# Patient Record
Sex: Male | Born: 1945 | Race: Black or African American | Hispanic: No | Marital: Married | Smoking: Former smoker
Health system: Southern US, Community
[De-identification: ages and names within clinical notes are randomized; demographics above are authoritative.]

## PROBLEM LIST (undated history)

## (undated) DIAGNOSIS — K5790 Diverticulosis of intestine, part unspecified, without perforation or abscess without bleeding: Secondary | ICD-10-CM

## (undated) DIAGNOSIS — E785 Hyperlipidemia, unspecified: Secondary | ICD-10-CM

## (undated) DIAGNOSIS — K5792 Diverticulitis of intestine, part unspecified, without perforation or abscess without bleeding: Secondary | ICD-10-CM

## (undated) DIAGNOSIS — K5900 Constipation, unspecified: Secondary | ICD-10-CM

## (undated) DIAGNOSIS — I5032 Chronic diastolic (congestive) heart failure: Secondary | ICD-10-CM

## (undated) DIAGNOSIS — N200 Calculus of kidney: Secondary | ICD-10-CM

## (undated) DIAGNOSIS — I10 Essential (primary) hypertension: Secondary | ICD-10-CM

## (undated) DIAGNOSIS — N529 Male erectile dysfunction, unspecified: Secondary | ICD-10-CM

## (undated) DIAGNOSIS — K589 Irritable bowel syndrome without diarrhea: Secondary | ICD-10-CM

## (undated) DIAGNOSIS — I509 Heart failure, unspecified: Secondary | ICD-10-CM

## (undated) DIAGNOSIS — D126 Benign neoplasm of colon, unspecified: Secondary | ICD-10-CM

## (undated) DIAGNOSIS — R011 Cardiac murmur, unspecified: Secondary | ICD-10-CM

## (undated) DIAGNOSIS — N289 Disorder of kidney and ureter, unspecified: Secondary | ICD-10-CM

## (undated) HISTORY — DX: Disorder of kidney and ureter, unspecified: N28.9

## (undated) HISTORY — DX: Constipation, unspecified: K59.00

## (undated) HISTORY — DX: Cardiac murmur, unspecified: R01.1

## (undated) HISTORY — DX: Irritable bowel syndrome, unspecified: K58.9

## (undated) HISTORY — DX: Diverticulosis of intestine, part unspecified, without perforation or abscess without bleeding: K57.90

## (undated) HISTORY — DX: Calculus of kidney: N20.0

## (undated) HISTORY — DX: Chronic diastolic (congestive) heart failure: I50.32

## (undated) HISTORY — DX: Benign neoplasm of colon, unspecified: D12.6

## (undated) HISTORY — DX: Essential (primary) hypertension: I10

## (undated) HISTORY — DX: Male erectile dysfunction, unspecified: N52.9

## (undated) HISTORY — PX: OTHER SURGICAL HISTORY: SHX169

## (undated) HISTORY — DX: Diverticulitis of intestine, part unspecified, without perforation or abscess without bleeding: K57.92

## (undated) HISTORY — DX: Hyperlipidemia, unspecified: E78.5

## (undated) HISTORY — DX: Heart failure, unspecified: I50.9

---

## 1996-07-03 HISTORY — PX: COLONOSCOPY: SHX174

## 1999-07-12 ENCOUNTER — Encounter: Payer: Self-pay | Admitting: Cardiovascular Disease

## 2004-07-25 ENCOUNTER — Ambulatory Visit: Payer: Self-pay | Admitting: Internal Medicine

## 2004-07-25 ENCOUNTER — Encounter: Admission: RE | Admit: 2004-07-25 | Discharge: 2004-07-25 | Payer: Self-pay | Admitting: Internal Medicine

## 2004-08-19 ENCOUNTER — Ambulatory Visit: Payer: Self-pay | Admitting: Internal Medicine

## 2005-01-02 ENCOUNTER — Ambulatory Visit (HOSPITAL_COMMUNITY): Admission: RE | Admit: 2005-01-02 | Discharge: 2005-01-02 | Payer: Self-pay | Admitting: Internal Medicine

## 2005-01-02 ENCOUNTER — Ambulatory Visit: Payer: Self-pay | Admitting: Internal Medicine

## 2005-01-06 ENCOUNTER — Ambulatory Visit: Payer: Self-pay | Admitting: Internal Medicine

## 2005-03-27 ENCOUNTER — Ambulatory Visit: Payer: Self-pay | Admitting: Internal Medicine

## 2005-04-04 ENCOUNTER — Ambulatory Visit: Payer: Self-pay | Admitting: Internal Medicine

## 2005-11-03 ENCOUNTER — Ambulatory Visit: Payer: Self-pay | Admitting: Internal Medicine

## 2005-11-16 ENCOUNTER — Ambulatory Visit: Payer: Self-pay | Admitting: Internal Medicine

## 2006-02-21 ENCOUNTER — Ambulatory Visit: Payer: Self-pay | Admitting: Internal Medicine

## 2006-09-07 ENCOUNTER — Ambulatory Visit: Payer: Self-pay | Admitting: Internal Medicine

## 2006-09-07 LAB — CONVERTED CEMR LAB
BUN: 20 mg/dL (ref 6–23)
Creatinine, Ser: 1 mg/dL (ref 0.4–1.5)
Hgb A1c MFr Bld: 6.9 % — ABNORMAL HIGH (ref 4.6–6.0)
Microalb Creat Ratio: 15.5 mg/g (ref 0.0–30.0)
Microalb, Ur: 2 mg/dL — ABNORMAL HIGH (ref 0.0–1.9)
Potassium: 4.4 meq/L (ref 3.5–5.1)
Uric Acid, Serum: 8.7 mg/dL — ABNORMAL HIGH (ref 2.4–7.0)

## 2006-11-28 DIAGNOSIS — I10 Essential (primary) hypertension: Secondary | ICD-10-CM | POA: Insufficient documentation

## 2007-05-03 ENCOUNTER — Telehealth (INDEPENDENT_AMBULATORY_CARE_PROVIDER_SITE_OTHER): Payer: Self-pay | Admitting: *Deleted

## 2007-05-09 ENCOUNTER — Ambulatory Visit: Payer: Self-pay | Admitting: Internal Medicine

## 2007-05-09 DIAGNOSIS — F528 Other sexual dysfunction not due to a substance or known physiological condition: Secondary | ICD-10-CM | POA: Insufficient documentation

## 2007-05-09 DIAGNOSIS — R9431 Abnormal electrocardiogram [ECG] [EKG]: Secondary | ICD-10-CM | POA: Insufficient documentation

## 2007-05-09 DIAGNOSIS — N529 Male erectile dysfunction, unspecified: Secondary | ICD-10-CM | POA: Insufficient documentation

## 2007-05-16 ENCOUNTER — Ambulatory Visit: Payer: Self-pay | Admitting: Cardiovascular Disease

## 2007-05-20 ENCOUNTER — Encounter (INDEPENDENT_AMBULATORY_CARE_PROVIDER_SITE_OTHER): Payer: Self-pay | Admitting: *Deleted

## 2007-05-28 ENCOUNTER — Ambulatory Visit: Payer: Self-pay

## 2007-05-28 ENCOUNTER — Encounter: Payer: Self-pay | Admitting: Cardiovascular Disease

## 2007-05-28 ENCOUNTER — Encounter: Payer: Self-pay | Admitting: Internal Medicine

## 2007-06-06 ENCOUNTER — Telehealth (INDEPENDENT_AMBULATORY_CARE_PROVIDER_SITE_OTHER): Payer: Self-pay | Admitting: *Deleted

## 2007-06-25 ENCOUNTER — Telehealth (INDEPENDENT_AMBULATORY_CARE_PROVIDER_SITE_OTHER): Payer: Self-pay | Admitting: *Deleted

## 2007-07-08 ENCOUNTER — Telehealth (INDEPENDENT_AMBULATORY_CARE_PROVIDER_SITE_OTHER): Payer: Self-pay | Admitting: *Deleted

## 2007-08-06 ENCOUNTER — Ambulatory Visit: Payer: Self-pay | Admitting: Internal Medicine

## 2007-08-06 LAB — CONVERTED CEMR LAB
ALT: 33 units/L (ref 0–53)
AST: 17 units/L (ref 0–37)
Cholesterol: 180 mg/dL (ref 0–200)
HDL: 59 mg/dL (ref >39.0)
Hgb A1c MFr Bld: 6.5 % — ABNORMAL HIGH (ref 4.6–6.0)
LDL Cholesterol: 106 mg/dL — ABNORMAL HIGH (ref 0–99)
Total CHOL/HDL Ratio: 3.1
Triglycerides: 77 mg/dL (ref 0–149)
VLDL: 15 mg/dL (ref 0–40)

## 2007-08-14 ENCOUNTER — Ambulatory Visit: Payer: Self-pay | Admitting: Internal Medicine

## 2007-08-14 DIAGNOSIS — E782 Mixed hyperlipidemia: Secondary | ICD-10-CM | POA: Insufficient documentation

## 2007-12-31 ENCOUNTER — Ambulatory Visit: Payer: Self-pay | Admitting: Internal Medicine

## 2007-12-31 DIAGNOSIS — M109 Gout, unspecified: Secondary | ICD-10-CM | POA: Insufficient documentation

## 2008-01-07 ENCOUNTER — Telehealth (INDEPENDENT_AMBULATORY_CARE_PROVIDER_SITE_OTHER): Payer: Self-pay | Admitting: *Deleted

## 2008-01-08 ENCOUNTER — Ambulatory Visit: Payer: Self-pay | Admitting: Internal Medicine

## 2008-01-10 ENCOUNTER — Telehealth (INDEPENDENT_AMBULATORY_CARE_PROVIDER_SITE_OTHER): Payer: Self-pay | Admitting: *Deleted

## 2008-01-13 ENCOUNTER — Encounter (INDEPENDENT_AMBULATORY_CARE_PROVIDER_SITE_OTHER): Payer: Self-pay | Admitting: *Deleted

## 2008-02-04 ENCOUNTER — Ambulatory Visit: Payer: Self-pay | Admitting: Internal Medicine

## 2008-02-10 LAB — CONVERTED CEMR LAB
ALT: 23 units/L (ref 0–53)
Albumin: 3.7 g/dL (ref 3.5–5.2)
BUN: 22 mg/dL (ref 6–23)
Bilirubin, Direct: 0.1 mg/dL (ref 0.0–0.3)
Cholesterol: 163 mg/dL (ref 0–200)
Creatinine, Ser: 1.3 mg/dL (ref 0.4–1.5)
HDL: 50.5 mg/dL (ref >39.0)
Hgb A1c MFr Bld: 6.7 % — ABNORMAL HIGH (ref 4.6–6.0)
LDL Cholesterol: 97 mg/dL (ref 0–99)
Potassium: 4.3 meq/L (ref 3.5–5.1)
Total Bilirubin: 0.9 mg/dL (ref 0.3–1.2)
Total CHOL/HDL Ratio: 3.2
Total Protein: 6.9 g/dL (ref 6.0–8.3)
Triglycerides: 77 mg/dL (ref 0–149)
VLDL: 15 mg/dL (ref 0–40)

## 2008-02-11 ENCOUNTER — Ambulatory Visit: Payer: Self-pay | Admitting: Internal Medicine

## 2008-08-31 ENCOUNTER — Telehealth (INDEPENDENT_AMBULATORY_CARE_PROVIDER_SITE_OTHER): Payer: Self-pay | Admitting: *Deleted

## 2008-09-04 ENCOUNTER — Ambulatory Visit: Payer: Self-pay | Admitting: Internal Medicine

## 2008-09-04 DIAGNOSIS — R609 Edema, unspecified: Secondary | ICD-10-CM | POA: Insufficient documentation

## 2008-09-04 DIAGNOSIS — M2550 Pain in unspecified joint: Secondary | ICD-10-CM | POA: Insufficient documentation

## 2008-09-05 ENCOUNTER — Encounter: Payer: Self-pay | Admitting: Cardiovascular Disease

## 2008-09-07 ENCOUNTER — Encounter: Payer: Self-pay | Admitting: Internal Medicine

## 2008-09-08 ENCOUNTER — Encounter (INDEPENDENT_AMBULATORY_CARE_PROVIDER_SITE_OTHER): Payer: Self-pay | Admitting: *Deleted

## 2008-09-23 ENCOUNTER — Ambulatory Visit: Payer: Self-pay | Admitting: Internal Medicine

## 2008-12-11 ENCOUNTER — Ambulatory Visit: Payer: Self-pay | Admitting: Internal Medicine

## 2008-12-15 ENCOUNTER — Encounter (INDEPENDENT_AMBULATORY_CARE_PROVIDER_SITE_OTHER): Payer: Self-pay | Admitting: *Deleted

## 2008-12-15 LAB — CONVERTED CEMR LAB
AST: 20 units/L (ref 0–37)
Albumin: 3.8 g/dL (ref 3.5–5.2)
Alkaline Phosphatase: 47 units/L (ref 39–117)
BUN: 19 mg/dL (ref 6–23)
Bilirubin, Direct: 0 mg/dL (ref 0.0–0.3)
Cholesterol: 166 mg/dL (ref 0–200)
Creatinine, Ser: 1.1 mg/dL (ref 0.4–1.5)
HDL: 44.7 mg/dL (ref >39.00)
LDL Cholesterol: 102 mg/dL — ABNORMAL HIGH (ref 0–99)
Potassium: 4 meq/L (ref 3.5–5.1)
Total Bilirubin: 1 mg/dL (ref 0.3–1.2)
Total CHOL/HDL Ratio: 4
Total Protein: 6.7 g/dL (ref 6.0–8.3)
Triglycerides: 98 mg/dL (ref 0.0–149.0)
VLDL: 19.6 mg/dL (ref 0.0–40.0)

## 2008-12-18 ENCOUNTER — Ambulatory Visit: Payer: Self-pay | Admitting: Internal Medicine

## 2008-12-18 DIAGNOSIS — E1129 Type 2 diabetes mellitus with other diabetic kidney complication: Secondary | ICD-10-CM | POA: Insufficient documentation

## 2008-12-19 LAB — CONVERTED CEMR LAB: Hgb A1c MFr Bld: 6.6 % — ABNORMAL HIGH (ref 4.6–6.5)

## 2008-12-22 ENCOUNTER — Encounter (INDEPENDENT_AMBULATORY_CARE_PROVIDER_SITE_OTHER): Payer: Self-pay | Admitting: *Deleted

## 2009-03-05 ENCOUNTER — Telehealth (INDEPENDENT_AMBULATORY_CARE_PROVIDER_SITE_OTHER): Payer: Self-pay | Admitting: *Deleted

## 2009-04-06 ENCOUNTER — Ambulatory Visit: Payer: Self-pay | Admitting: Internal Medicine

## 2009-04-07 ENCOUNTER — Encounter (INDEPENDENT_AMBULATORY_CARE_PROVIDER_SITE_OTHER): Payer: Self-pay | Admitting: *Deleted

## 2009-04-07 LAB — CONVERTED CEMR LAB
Creatinine,U: 158.1 mg/dL
Microalb Creat Ratio: 3.2 mg/g (ref 0.0–30.0)
Microalb, Ur: 0.5 mg/dL (ref 0.0–1.9)
Uric Acid, Serum: 5 mg/dL (ref 4.0–7.8)

## 2009-09-20 ENCOUNTER — Encounter: Payer: Self-pay | Admitting: Internal Medicine

## 2009-11-04 ENCOUNTER — Ambulatory Visit: Payer: Self-pay | Admitting: Internal Medicine

## 2009-11-15 ENCOUNTER — Ambulatory Visit: Payer: Self-pay | Admitting: Internal Medicine

## 2009-12-20 ENCOUNTER — Ambulatory Visit: Payer: Self-pay | Admitting: Internal Medicine

## 2009-12-21 ENCOUNTER — Ambulatory Visit: Payer: Self-pay | Admitting: Internal Medicine

## 2010-01-19 ENCOUNTER — Encounter: Payer: Self-pay | Admitting: Internal Medicine

## 2010-02-16 ENCOUNTER — Encounter: Payer: Self-pay | Admitting: Internal Medicine

## 2010-02-23 ENCOUNTER — Telehealth (INDEPENDENT_AMBULATORY_CARE_PROVIDER_SITE_OTHER): Payer: Self-pay | Admitting: *Deleted

## 2010-02-25 ENCOUNTER — Ambulatory Visit: Payer: Self-pay | Admitting: Internal Medicine

## 2010-02-28 LAB — CONVERTED CEMR LAB
ALT: 24 units/L (ref 0–53)
AST: 14 units/L (ref 0–37)
Albumin: 4 g/dL (ref 3.5–5.2)
Alkaline Phosphatase: 53 units/L (ref 39–117)
BUN: 20 mg/dL (ref 6–23)
Cholesterol: 184 mg/dL (ref 0–200)
Creatinine, Ser: 1.3 mg/dL (ref 0.4–1.5)
Creatinine,U: 164.9 mg/dL
HDL: 54.6 mg/dL (ref >39.00)
Hgb A1c MFr Bld: 6.9 % — ABNORMAL HIGH (ref 4.6–6.5)
Microalb Creat Ratio: 1.8 mg/g (ref 0.0–30.0)
Microalb, Ur: 3 mg/dL — ABNORMAL HIGH (ref 0.0–1.9)
TSH: 1.53 microintl units/mL (ref 0.35–5.50)
Total Bilirubin: 1.1 mg/dL (ref 0.3–1.2)
Total CHOL/HDL Ratio: 3
Total Protein: 6.9 g/dL (ref 6.0–8.3)
Triglycerides: 78 mg/dL (ref 0.0–149.0)
VLDL: 15.6 mg/dL (ref 0.0–40.0)

## 2010-03-02 ENCOUNTER — Ambulatory Visit: Payer: Self-pay | Admitting: Internal Medicine

## 2010-04-01 ENCOUNTER — Encounter: Payer: Self-pay | Admitting: Internal Medicine

## 2010-06-02 ENCOUNTER — Ambulatory Visit: Payer: Self-pay | Admitting: Internal Medicine

## 2010-06-06 LAB — CONVERTED CEMR LAB
ALT: 23 U/L
AST: 16 U/L
Albumin: 4 g/dL
Alkaline Phosphatase: 45 U/L
Bilirubin, Direct: 0.1 mg/dL
Cholesterol: 157 mg/dL
HDL: 50.5 mg/dL
Hgb A1c MFr Bld: 6.3 %
LDL Cholesterol: 94 mg/dL
Total Bilirubin: 0.7 mg/dL
Total CHOL/HDL Ratio: 3
Total Protein: 6.7 g/dL
Triglycerides: 64 mg/dL
VLDL: 12.8 mg/dL

## 2010-06-08 ENCOUNTER — Ambulatory Visit: Payer: Self-pay | Admitting: Internal Medicine

## 2010-07-31 LAB — CONVERTED CEMR LAB
ALT: 26 units/L (ref 0–53)
AST: 18 units/L (ref 0–37)
Albumin: 3.9 g/dL (ref 3.5–5.2)
Alkaline Phosphatase: 50 units/L (ref 39–117)
Basophils Absolute: 0 10*3/uL (ref 0.0–0.1)
Basophils Relative: 0.5 % (ref 0.0–1.0)
Bilirubin, Direct: 0.1 mg/dL (ref 0.0–0.3)
CO2: 27 meq/L (ref 19–32)
Calcium: 9.5 mg/dL (ref 8.4–10.5)
Chloride: 105 meq/L (ref 96–112)
Cholesterol, target level: 200 mg/dL
Cholesterol: 217 mg/dL (ref 0–200)
Creatinine, Ser: 1.2 mg/dL (ref 0.4–1.5)
Direct LDL: 161.8 mg/dL
Eosinophils Relative: 1.7 % (ref 0.0–5.0)
GFR calc Af Amer: 79 mL/min
Glucose, Bld: 133 mg/dL — ABNORMAL HIGH (ref 70–99)
HCT: 41.8 % (ref 39.0–52.0)
HDL goal, serum: 40 mg/dL
HDL: 52 mg/dL (ref >39.0)
Hemoglobin: 13.9 g/dL (ref 13.0–17.0)
Hgb A1c MFr Bld: 6.2 % — ABNORMAL HIGH (ref 4.6–6.0)
Hgb A1c MFr Bld: 6.7 % — ABNORMAL HIGH (ref 4.6–6.0)
Lymphocytes Relative: 35.7 % (ref 12.0–46.0)
Monocytes Absolute: 0.4 10*3/uL (ref 0.2–0.7)
Monocytes Relative: 9.7 % (ref 3.0–11.0)
Neutro Abs: 2.3 10*3/uL (ref 1.4–7.7)
Neutrophils Relative %: 52.4 % (ref 43.0–77.0)
PSA: 0.7 ng/mL (ref 0.10–4.00)
Platelets: 226 10*3/uL (ref 150–400)
Potassium: 4.1 meq/L (ref 3.5–5.1)
Potassium: 4.7 meq/L (ref 3.5–5.1)
RBC: 5.11 M/uL (ref 4.22–5.81)
RDW: 13.1 % (ref 11.5–14.6)
Sodium: 139 meq/L (ref 135–145)
TSH: 1.54 microintl units/mL (ref 0.35–5.50)
Total Bilirubin: 1 mg/dL (ref 0.3–1.2)
Total CHOL/HDL Ratio: 4.2
Total Protein: 6.9 g/dL (ref 6.0–8.3)
Triglycerides: 99 mg/dL (ref 0–149)
VLDL: 20 mg/dL (ref 0–40)
WBC: 4.3 10*3/uL — ABNORMAL LOW (ref 4.5–10.5)

## 2010-08-03 NOTE — Assessment & Plan Note (Signed)
Summary: cpx//tl   Vital Signs:  Patient Profile:   65 Years Old Male Weight:      229.38 pounds Pulse rate:   64 / minute Pulse rhythm:   regular Resp:     16 per minute BP sitting:   174 / 100  (left arm) Cuff size:   large  Pt. in pain?   no  Vitals Entered By: Wendall Stade (May 09, 2007 8:25 AM)                  Chief Complaint:  cpx and Type 2 diabetes mellitus follow-up.  History of Present Illness: He would like another script for Viagra; fasting blood sugar this am was 147 and last night his sugar was 113. He is on 4 BP meds (CCB,B blocker, ACE-I & ARB ) w/o control.See NMR 8/07; LDL 164 with 1803 total & 1610 small dense particles for a risk of 15 %.LDL goal = < 110.  Type 2 Diabetes Mellitus Follow-Up      This is a 65 year old man who presents for Type 2 diabetes mellitus follow-up.  FBS ? 140 on average;pc glucoses  not checked.  The patient reports weight gain, but denies polyuria, polydipsia, blurred vision, self managed hypoglycemia, hypoglycemia requiring help, weight loss, and numbness of extremities.  The patient denies the following symptoms: neuropathic pain, chest pain, vomiting, orthostatic symptoms, poor wound healing, intermittent claudication, vision loss, and foot ulcer.  Since the last visit the patient reports poor dietary compliance, compliance with medications, not exercising regularly, and monitoring blood glucose.  The patient has been measuring capillary blood glucose before breakfast.  Since the last visit, the patient reports having had no eye care and no foot care.    Hypertension History:      He denies headache, chest pain, palpitations, dyspnea with exertion, orthopnea, PND, peripheral edema, visual symptoms, neurologic problems, syncope, and side effects from treatment.  He notes no problems with any antihypertensive medication side effects.  Further comments include: BP @ home 150-/80-85 - 165/90-95.        Positive major cardiovascular  risk factors include male age 12 years old or older, diabetes, hyperlipidemia, and hypertension.  Negative major cardiovascular risk factors include negative family history for ischemic heart disease and non-tobacco-user status.        Further assessment for target organ damage reveals no history of ASHD, stroke/TIA, or peripheral vascular disease.    Lipid Management History:      Positive NCEP/ATP III risk factors include male age 40 years old or older, diabetes, and hypertension.  Negative NCEP/ATP III risk factors include no family history for ischemic heart disease, non-tobacco-user status, no ASHD (atherosclerotic heart disease), no prior stroke/TIA, no peripheral vascular disease, and no history of aortic aneurysm.      Current Allergies (reviewed today): No known allergies   Past Medical History:    Hypertension    Diabetes mellitus, type II    Hyperlipidemia  Past Surgical History:    Colonoscopy X 2   Family History:    Family History Diabetes 1st degree relative (F)    Family History MI (M 81); F prostate CA  Social History:    NO DIET   Risk Factors:  Tobacco use:  quit    Year quit:  1985 Alcohol use:  yes    Type:  occa Exercise:  no  Family History Risk Factors:    Family History of MI in females < 65 years  old:  no    Family History of MI in males < 71 years old:  no   Review of Systems  General      Denies chills, fatigue, fever, loss of appetite, malaise, sleep disorder, sweats, weakness, and weight loss.  Eyes      Denies blurring, discharge, double vision, eye irritation, eye pain, halos, itching, light sensitivity, red eye, vision loss-1 eye, and vision loss-both eyes.  ENT      Denies decreased hearing, difficulty swallowing, ear discharge, earache, hoarseness, nasal congestion, nosebleeds, postnasal drainage, ringing in ears, sinus pressure, and sore throat.  CV      Denies bluish discoloration of lips or nails, chest pain or discomfort,  difficulty breathing at night, difficulty breathing while lying down, leg cramps with exertion, palpitations, shortness of breath with exertion, and swelling of feet.  Resp      Denies cough, excessive snoring, hypersomnolence, morning headaches, shortness of breath, and sputum productive.  GI      Denies abdominal pain, bloody stools, change in bowel habits, constipation, dark tarry stools, diarrhea, indigestion, nausea, and vomiting.  GU      Complains of erectile dysfunction.      Denies decreased libido, hematuria, nocturia, and urinary frequency.  MS      Denies joint pain, joint redness, joint swelling, loss of strength, low back pain, mid back pain, muscle aches, muscle , cramps, muscle weakness, stiffness, and thoracic pain.  Derm      Denies changes in color of skin, changes in nail beds, dryness, excessive perspiration, flushing, hair loss, itching, lesion(s), poor wound healing, and rash.  Neuro      Denies difficulty with concentration, disturbances in coordination, headaches, memory loss, numbness, poor balance, and tingling.  Psych      Denies anxiety, depression, easily angered, easily tearful, and irritability.  Endo      Denies cold intolerance, excessive hunger, excessive thirst, excessive urination, heat intolerance, polyuria, and weight change.  Heme      Denies abnormal bruising and bleeding.  Allergy      Denies hives or rash, itching eyes, persistent infections, seasonal allergies, and sneezing.   Physical Exam  General:     Well-developed,well-nourished,in no acute distress; alert,appropriate and cooperative throughout examination;overweight-appearing.   Head:     Normocephalic and atraumatic without obvious abnormalities. No apparent alopecia or balding. Eyes:     Art narrowing Ears:     Slight scarring TMs Nose:     Septum to R Mouth:     Significance caries ("Dentist wanted to pull them") Neck:     No deformities, masses, or tenderness  noted. Lungs:     Normal respiratory effort, chest expands symmetrically. Lungs are clear to auscultation, no crackles or wheezes. Heart:     Normal rate and regular rhythm. S1 and S2 normal without gallop, murmur, click, rub. S4 with slurring Abdomen:     Bowel sounds positive,abdomen soft and non-tender without masses, organomegaly or hernias noted. Protuberant Rectal:     No external abnormalities noted. Normal sphincter tone. No rectal masses or tenderness.FOB neg Genitalia:     Testes bilaterally descended without nodularity, tenderness or masses. No scrotal masses or lesions. No penis lesions or urethral discharge. Prostate:     Prostate gland firm and smooth, no enlargement, nodularity, tenderness, mass, asymmetry or induration. Pulses:     R and L carotid,radial,dorsalis pedis and posterior tibial pulses are full and equal bilaterally Extremities:     trace left pedal  edema and trace right pedal edema.Lipedema  Neurologic:     strength normal in all extremities, gait normal, and DTRs symmetrical and normal.   Skin:     Intact without suspicious lesions or rashes Cervical Nodes:     No lymphadenopathy noted Axillary Nodes:     No palpable lymphadenopathy Psych:     Oriented X3, memory intact for recent and remote, normally interactive, good eye contact, not anxious appearing, and not depressed appearing.  Non adherent    Impression & Recommendations:  Problem # 1:  ROUTINE GENERAL MEDICAL EXAM@[email or web address]  CARE FACL (ICD-V70.0)  Orders: EKG w/ Interpretation (93000) TLB-Lipid Panel (80061-LIPID) TLB-BMP (Basic Metabolic Panel-BMET) (80048-METABOL) TLB-CBC Platelet - w/Differential (85025-CBCD) TLB-Hepatic/Liver Function Pnl (80076-HEPATIC) TLB-TSH (Thyroid Stimulating Hormone) (84443-TSH) TLB-A1C / Hgb A1C (Glycohemoglobin) (83036-A1C) TLB-Microalbumin/Creat Ratio, Urine (82043-MALB) TLB-PSA (Prostate Specific Antigen) (84153-PSA)   Problem # 2:  DIABETES MELLITUS, TYPE  II (ICD-250.00)  His updated medication list for this problem includes:    Actoplus Met 15-500 Mg Tabs (Pioglitazone hcl-metformin hcl) .[name]... Take 1 tablet daily with largest meals    Micardis 80 Mg Tabs (Telmisartan) .[name]... 1 by mouth once daily -    Benazepril Hcl 40 Mg Tabs (Benazepril hcl) .[name]... 1 by mouth once daily - due   Problem # 3:  HYPERTENSION, ESSENTIAL NOS (ICD-401.9)  His updated medication list for this problem includes:    Verapamil Hcl Cr 240 Mg Tbcr (Verapamil hcl) .[name]... 1/2 by mouth bid    Metoprolol Tartrate 100 Mg Tabs (Metoprolol tartrate) .[name]... Take 1 tablet 2 times a day    Micardis 80 Mg Tabs (Telmisartan) .[name]... 1 by mouth once daily -    Benazepril Hcl 40 Mg Tabs (Benazepril hcl) .[name]... 1 by mouth once daily - due  Orders: Cardiology Referral (Cardiology)   Problem # 4:  ERECTILE DYSFUNCTION (ICD-302.72)  His updated medication list for this problem includes:    Viagra 100 Mg Tabs (Sildenafil citrate) ..... Use as directed   Problem # 5:  NONSPECIFIC ABNORMAL ELECTROCARDIOGRAM (ICD-794.31)  Orders: Cardiology Referral (Cardiology)   Complete Medication List: 1)  Verapamil Hcl Cr 240 Mg Tbcr (Verapamil hcl) .... 1/2 by mouth bid 2)  Actoplus Met 15-500 Mg Tabs (Pioglitazone hcl-metformin hcl) .... Take 1 tablet daily with largest meals 3)  Freestyle Test Strp (Glucose blood) .... Check as directed 4)  Metoprolol Tartrate 100 Mg Tabs (Metoprolol tartrate) .... Take 1 tablet 2 times a day 5)  Viagra 100 Mg Tabs (Sildenafil citrate) .... Use as directed 6)  Micardis 80 Mg Tabs (Telmisartan) .[name].. 1 by mouth once daily - 7)  Benazepril Hcl 40 Mg Tabs (Benazepril hcl) .[name].. 1 by mouth once daily - due  Other Orders: Podiatry Referral (Podiatry)  Hypertension Assessment/Plan:      The patient's hypertensive risk group is category C: Target organ damage and/or diabetes.  Today's blood pressure is 174/100.    Lipid Assessment/Plan:      Based on NCEP/ATP  III, the patient's risk factor category is "history of diabetes".  From this information, the patient's calculated lipid goals are as follows: Total cholesterol goal is 200; LDL cholesterol goal is 100; HDL cholesterol goal is 40; Triglyceride goal is 150.  His LDL cholesterol goal has not been met.  Secondary causes for hyperlipidemia have been ruled out.  He has been counseled on adjunctive measures for lowering his cholesterol and has been provided with dietary instructions.     Patient Instructions: 1)  Please review Goal Sheet ;  complete stool cards. 2)  Check your blood sugars regularly. If your readings are usually above : or below 70 you should contact our office. 3)  See your eye doctor yearly to check for diabetic eye damage. 4)  Check your feet each night for sore areas, calluses or signs of infection. Please address cavities.    Prescriptions: BENAZEPRIL HCL 40 MG  TABS (BENAZEPRIL HCL) 1 by mouth once daily - due  #90 x 1   Entered and Authorized by:   Marga Melnick MD   Signed by:   Marga Melnick MD on 05/09/2007   Method used:   Print then Give to Patient   RxID:   [id number] MICARDIS 80 MG  TABS (TELMISARTAN) 1 by mouth once daily -  #30 x 5   Entered and Authorized by:   Marga Melnick MD   Signed by:   Marga Melnick MD on 05/09/2007   Method used:   Print then Give to Patient   RxID:   [id number] METOPROLOL TARTRATE 100 MG  TABS (METOPROLOL TARTRATE) take 1 tablet 2 times a day  #90 x 1   Entered and Authorized by:   Marga Melnick MD   Signed by:   Marga Melnick MD on 05/09/2007   Method used:   Print then Give to Patient   RxID:   [id number] FREESTYLE TEST   STRP (GLUCOSE BLOOD) check as directed  #100 x 3   Entered and Authorized by:   Marga Melnick MD   Signed by:   Marga Melnick MD on 05/09/2007   Method used:   Print then Give to Patient   RxID:   [id number] ACTOPLUS MET 15-500 MG TABS (PIOGLITAZONE HCL-METFORMIN HCL) take 1 tablet  daily with largest meals  #30 x 5   Entered and Authorized by:   Marga Melnick MD   Signed by:   Marga Melnick MD on 05/09/2007   Method used:   Print then Give to Patient   RxID:   [id number] VERAPAMIL HCL CR 240 MG TBCR (VERAPAMIL HCL) 1/2 by mouth bid  #90 x 1   Entered and Authorized by:   Marga Melnick MD   Signed by:   Marga Melnick MD on 05/09/2007   Method used:   Print then Give to Patient   RxID:   [id number]  ]

## 2010-08-03 NOTE — Letter (Signed)
Summary: Results Follow up Letter  [facility]  [address] [place], [place] [address]   Phone: [phone]  Fax: [phone]    01/13/2008 MRN: [id number]  MATHEU CAGNINA [address] [place], [place]  [address]  Dear Mr. Vanpatten,  The following are the results of your recent test(s):  Test         Result    Pap Smear:        Normal _____  Not Normal _____ Comments: ______________________________________________________ Cholesterol: LDL(Bad cholesterol):         Your goal is less than:         HDL (Good cholesterol):       Your goal is more than: Comments:  ______________________________________________________ Mammogram:        Normal _____  Not Normal _____ Comments:  ___________________________________________________________________ Hemoccult:        Normal _____  Not normal _______ Comments:    _____________________________________________________________________ Other Tests:    We routinely do not discuss normal results over the telephone.  If you desire a copy of the results, or you have any questions about this information we can discuss them at your next office visit.   Sincerely,

## 2010-08-03 NOTE — Letter (Signed)
Summary: Results Follow up Letter  [facility]  [address] [place], [place] [address]   Phone: [phone]  Fax: [phone]    04/07/2009 MRN: [id number]  Noah Barnes [address] [place], [place]  [address]  Dear Mr. Rajagopalan,  The following are the results of your recent test(s):  Test         Result    Pap Smear:        Normal _____  Not Normal _____ Comments: ______________________________________________________ Cholesterol: LDL(Bad cholesterol):         Your goal is less than:         HDL (Good cholesterol):       Your goal is more than: Comments:  ______________________________________________________ Mammogram:        Normal _____  Not Normal _____ Comments:  ___________________________________________________________________ Hemoccult:        Normal _____  Not normal _______ Comments:    _____________________________________________________________________ Other Tests:PLEASE SEE COPY OF LABS FROM 04/06/09 AND COMMENTS    We routinely do not discuss normal results over the telephone.  If you desire a copy of the results, or you have any questions about this information we can discuss them at your next office visit.   Sincerely,

## 2010-08-03 NOTE — Progress Notes (Signed)
Summary: refill request - dr hopper  Phone Note Refill Request   Refills Requested: Medication #1:  FREESTYLE TEST   STRP check as directed   Last Refilled: [id number] refill request from [id number] - [id number] Jani Files [id number]  Initial call taken by: Okey Regal [place],  June 25, 2007 2:54 PM      Prescriptions: FREESTYLE TEST   STRP (GLUCOSE BLOOD) check as directed  #100 x 3   Entered by:   Wendall Stade   Authorized by:   Marga Melnick MD   Signed by:   Wendall Stade on 06/26/2007   Method used:   Electronically sent to ...       [facility] [phone]*       [address] [place], [place]  [address]       Ph: [phone] or [phone]       Fax: [phone]   RxID:   [id number]

## 2010-08-03 NOTE — Assessment & Plan Note (Signed)
Summary: ACUTE/COUGH CHEST CONGESTION   Vital Signs:  Patient profile:   65 year old male Height:      68 inches Weight:      240.2 pounds BMI:     36.65 Temp:     97.8 degrees F oral Pulse rate:   60 / minute Resp:     17 per minute BP sitting:   140 / 92  (left arm) Cuff size:   large  Vitals Entered By: Shonna Chock (September 23, 2008 11:58 AM) Comments COUGH AND CHEST CONGESTION SINCE LAST FRIDAY   History of Present Illness: Onset 09/18/2008 as  chest congestion with minimal sputum. Rx: Mucinex   Allergies (verified): No Known Drug Allergies  Review of Systems General:  Denies chills, fever, and sweats. ENT:  Denies nasal congestion and sinus pressure; No frontal headache,facial pain or purulence. Resp:  Complains of cough and sputum productive; denies chest pain with inspiration and coughing up blood; Clear phlegm. Allergy:  Denies itching eyes and sneezing.  Physical Exam  General:  well-nourished,in no acute distress; alert,appropriate and cooperative throughout examination Ears:  External ear exam shows no significant lesions or deformities.  Otoscopic examination reveals clear canals, tympanic membranes are intact bilaterally without bulging, retraction, inflammation or discharge. Hearing is grossly normal bilaterally. Nose:  External nasal examination shows no deformity or inflammation. Nasal mucosa are pink and moist without lesions or exudates. Mouth:  Oral mucosa and oropharynx without lesions or exudates.  Teeth in good repair. Lungs:  Normal respiratory effort, chest expands symmetrically. Lungs are clear to auscultation, no crackles or wheezes. Cervical Nodes:  No lymphadenopathy noted Axillary Nodes:  No palpable lymphadenopathy   Impression & Recommendations:  Problem # 1:  BRONCHITIS-ACUTE (ICD-466.0)  His updated medication list for this problem includes:    Azithromycin 250 Mg Tabs (Azithromycin) .[name]... As per pack  Complete Medication List: 1)   Verapamil Hcl Cr 240 Mg Tbcr (Verapamil hcl) .... 1/2 by mouth bid 2)  Actoplus Met 15-850mg  Tabs (pioglitazone Hcl-metformin Hcl)  .... Take 1 tablet bid with 2  largest meals 3)  Freestyle Test Strp (Glucose blood) .... Check as directed 4)  Metoprolol Tartrate 100 Mg Tabs (Metoprolol tartrate) .... Take 1 tablet 2 times a day 5)  Viagra 100 Mg Tabs (Sildenafil citrate) .... Use as directed 6)  Benazepril Hcl 40 Mg Tabs (Benazepril hcl) .[name].. 1 by mouth bid 7)  Pravastatin Sodium 40 Mg Tabs (Pravastatin sodium) .[name].. 1 qhs 8)  Freestyle Lancets Misc (Lancets) .... Use as directed 9)  Allopurinol 300 Mg Tabs (Allopurinol) .[name].. 1 once daily as gout prevention(do not take if gout active) 10)  Glimepiride 2 Mg Tabs (Glimepiride) .[name].. 1 q am 11)  Spironolactone 25 Mg Tabs (Spironolactone) .[name].. 1 once daily if bp averages > 130/85 12)  Azithromycin 250 Mg Tabs (Azithromycin) .... As per pack  Patient Instructions: 1)  Drink as much fluid as you can tolerate for the next few days. Mucinex  Plain or DM (NOT  Mucinex D) Prescriptions: AZITHROMYCIN 250 MG TABS (AZITHROMYCIN) as per pack  #1 x 0   Entered and Authorized by:   Marga Melnick   Signed by:   Marga Melnick on 09/23/2008   Method used:   Print then Give to Patient   RxID:   [id number]

## 2010-08-03 NOTE — Letter (Signed)
Summary: Results Follow up Letter  [facility]  [address] [place], [place] [address]   Phone: [phone]  Fax: [phone]    12/15/2008 MRN: [id number]  Noah Barnes [address] [place], [place]  [address]  Dear Noah Barnes,  The following are the results of your recent test(s):  Test         Result    Pap Smear:        Normal _____  Not Normal _____ Comments: ______________________________________________________ Cholesterol: LDL(Bad cholesterol):         Your goal is less than:         HDL (Good cholesterol):       Your goal is more than: Comments:  ______________________________________________________ Mammogram:        Normal _____  Not Normal _____ Comments:  ___________________________________________________________________ Hemoccult:        Normal _____  Not normal _______ Comments:    _____________________________________________________________________ Other Tests: PLEASE SEE COPY OF LABS FROM 12/11/08 AND COMMENTS    We routinely do not discuss normal results over the telephone.  If you desire a copy of the results, or you have any questions about this information we can discuss them at your next office visit.   Sincerely,

## 2010-08-03 NOTE — Assessment & Plan Note (Signed)
Summary: chest cold//fd   Vital Signs:  Patient profile:   65 year old male Weight:      234.8 pounds Temp:     98.3 degrees F oral Pulse rate:   64 / minute Resp:     16 per minute BP sitting:   124 / 70  (left arm) Cuff size:   large  Vitals Entered By: Georgette Dover (Nov 04, 2009 4:21 PM) CC: Chest Cold Comments REVIEWED MED LIST, PATIENT AGREED DOSE AND INSTRUCTION CORRECT    CC:  Chest Cold.  History of Present Illness: Onset 1 week as chest congestion which has progressed. Rx: Mucinex D  Allergies (verified): No Known Drug Allergies  Review of Systems General:  Denies chills, fever, and sweats. ENT:  Denies nasal congestion, sinus pressure, and sore throat; No frontal headache, facial pain or purulence. Resp:  Complains of cough; denies chest pain with inspiration, coughing up blood, pleuritic, shortness of breath, sputum productive, and wheezing. Allergy:  Complains of sneezing; denies itching eyes; Minor sneezing.  Physical Exam  General:  in no acute distress; alert,appropriate and cooperative throughout examination Ears:  External ear exam shows no significant lesions or deformities.  Otoscopic examination reveals clear canals, tympanic membranes are intact bilaterally without bulging, retraction, inflammation or discharge. Hearing is grossly normal bilaterally. Nose:  External nasal examination shows no deformity or inflammation. Nasal mucosa are pink and moist without lesions or exudates. Mouth:  Oral mucosa and oropharynx without lesions or exudates.  Caries R post  upper molar  Lungs:  Normal respiratory effort, chest expands symmetrically. Lungs are clear to auscultation, no crackles or wheezes. Heart:  normal rate, regular rhythm, no gallop, no rub, no JVD, and grade [id number] /6 systolic murmur.   Cervical Nodes:  No lymphadenopathy noted Axillary Nodes:  No palpable lymphadenopathy   Impression & Recommendations:  Problem # 1:  BRONCHITIS-ACUTE  (ICD-466.0)  His updated medication list for this problem includes:    Azithromycin 250 Mg Tabs (Azithromycin) .[name]... As per pack    Benzonatate 200 Mg Caps (Benzonatate) .[name]... 1 every 6-8 hrs as needed cough  Complete Medication List: 1)  Verapamil Hcl Cr 240 Mg Tbcr (Verapamil hcl) .... 1/2 by mouth bid 2)  Actoplus Met 15-850mg  Tabs (pioglitazone Hcl-metformin Hcl)  .... Take 1 tablet bid with 2  largest meals 3)  Freestyle Test Strp (Glucose blood) .... Check as directed 4)  Metoprolol Tartrate 100 Mg Tabs (Metoprolol tartrate) .... Take 1 tablet 2 times a day 5)  Viagra 100 Mg Tabs (Sildenafil citrate) .... Use as directed - do not take with nitroglycerin 6)  Benazepril Hcl 40 Mg Tabs (Benazepril hcl) .[name].. 1 by mouth bid 7)  Pravastatin Sodium 40 Mg Tabs (Pravastatin sodium) .[name].. 1 qhs 8)  Freestyle Lancets Misc (Lancets) .... Use as directed 9)  Allopurinol 300 Mg Tabs (Allopurinol) .... 1/2  daily as gout prevention(do not take if gout active) 10)  Glimepiride 2 Mg Tabs (Glimepiride) .[name].. 1 q am 11)  Spironolactone 25 Mg Tabs (Spironolactone) .[name].. 1 once daily if bp averages > 130/85 12)  Clonidine Hcl 0.1 Mg Tabs (Clonidine hcl) .[name].. 1 two times a day with metoprolol 13)  Azithromycin 250 Mg Tabs (Azithromycin) .... As per pack 14)  Benzonatate 200 Mg Caps (Benzonatate) .[name].. 1 every 6-8 hrs as needed cough  Patient Instructions: 1)  PLEASE have caries addressed. 2)  Drink as much fluid as you can tolerate for the next few days. Prescriptions: BENZONATATE 200  MG CAPS (BENZONATATE) 1 every 6-8 hrs as needed cough  #15 x 0   Entered and Authorized by:   Unice Cobble MD   Signed by:   Unice Cobble MD on 11/04/2009   Method used:   Faxed to ...       [facility] (retail)       [phone]       [place], [place]  [address]       Ph: [phone]       Fax: [phone]   RxID:   [id number] AZITHROMYCIN 250 MG TABS  (AZITHROMYCIN) as per pack  #1 x 0   Entered and Authorized by:   Unice Cobble MD   Signed by:   Unice Cobble MD on 11/04/2009   Method used:   Faxed to ...       [facility] (retail)       [phone]       [place], [place]  [address]       Ph: [phone]       Fax: [phone]   RxID:   [id number]

## 2010-08-03 NOTE — Assessment & Plan Note (Signed)
Summary: review lab/cbs   Vital Signs:  Patient profile:   65 year old male Weight:      239.8 pounds Pulse rate:   54 / minute Resp:     16 per minute BP sitting:   142 / 90  (left arm) Cuff size:   large  Vitals Entered By: Shonna Chock (December 18, 2008 8:13 AM) CC: FOLLOW-UP ON LABS/REFILL MEDS  Comments REVIEWED MED LIST, PATIENT AGREED DOSE AND INSTRUCTION CORRECT, OUT OF SPIRONOLACTONE SINCE TUESDAY   CC:  FOLLOW-UP ON LABS/REFILL MEDS .  History of Present Illness: Labs reviewed ; A1c not drawn as ordered. BP 140s/low 90s @ home with 4X increased risk. FBS 115-118; 2 hr post meal < 104 (?). No hypoglycemia ; no weight change. CVE as 30 min of cardio every other day ; no C-P symptoms with CVE. Goals, risks  & low glycemic  load discussed.  Allergies (verified): No Known Drug Allergies  Review of Systems Eyes:  No DM changes in 2009. CV:  Denies chest pain or discomfort, leg cramps with exertion, lightheadness, near fainting, palpitations, and shortness of breath with exertion. Derm:  Denies poor wound healing. Endo:  Denies excessive hunger, excessive thirst, and excessive urination.  Physical Exam  General:  in no acute distress; alert,appropriate and cooperative throughout examination Lungs:  Normal respiratory effort, chest expands symmetrically. Lungs are clear to auscultation, no crackles or wheezes. Heart:  Regular rhythm. S1 and S2 normal without gallop, murmur, click, rub. S4 with slurring.Bradycardia.   Pulses:  R and L carotid,radial,dorsalis pedis and posterior tibial pulses are full and equal bilaterally Extremities:  trace left pedal edema and trace right pedal edema.   Skin:  Intact without suspicious lesions or rashes Psych:  memory intact for recent and remote, normally interactive, and good eye contact.     Impression & Recommendations:  Problem # 1:  HYPERTENSION, ESSENTIAL NOS (ICD-401.9) Uncontrolled with 4X increased risk His updated medication  list for this problem includes:    Verapamil Hcl Cr 240 Mg Tbcr (Verapamil hcl) .[name]... 1/2 by mouth bid    Metoprolol Tartrate 100 Mg Tabs (Metoprolol tartrate) .[name]... Take 1 tablet 2 times a day    Benazepril Hcl 40 Mg Tabs (Benazepril hcl) .[name]... 1 by mouth bid    Spironolactone 25 Mg Tabs (Spironolactone) .[name]... 1 once daily if bp averages > 130/85    Clonidine Hcl 0.1 Mg Tabs (Clonidine hcl) .[name]... 1 two times a day with metoprolol  Problem # 2:  DIABETES MELLITUS, TYPE II, WITHOUT COMPLICATIONS (ICD-250.00) Repeat A1c pending His updated medication list for this problem includes:    Benazepril Hcl 40 Mg Tabs (Benazepril hcl) .[name]... 1 by mouth bid    Glimepiride 2 Mg Tabs (Glimepiride) .[name]... 1 q am  Problem # 3:  HYPERLIPIDEMIA (ICD-272.2) Minimal elevation of LDL  His updated medication list for this problem includes:    Pravastatin Sodium 40 Mg Tabs (Pravastatin sodium) .[name]... 1 qhs  Complete Medication List: 1)  Verapamil Hcl Cr 240 Mg Tbcr (Verapamil hcl) .... 1/2 by mouth bid 2)  Actoplus Met 15-850mg  Tabs (pioglitazone Hcl-metformin Hcl)  .... Take 1 tablet bid with 2  largest meals 3)  Freestyle Test Strp (Glucose blood) .... Check as directed 4)  Metoprolol Tartrate 100 Mg Tabs (Metoprolol tartrate) .... Take 1 tablet 2 times a day 5)  Viagra 100 Mg Tabs (Sildenafil citrate) .... Use as directed 6)  Benazepril Hcl 40 Mg Tabs (Benazepril hcl) .[name].. 1 by  mouth bid 7)  Pravastatin Sodium 40 Mg Tabs (Pravastatin sodium) .[name].. 1 qhs 8)  Freestyle Lancets Misc (Lancets) .... Use as directed 9)  Allopurinol 300 Mg Tabs (Allopurinol) .[name].. 1 once daily as gout prevention(do not take if gout active) 10)  Glimepiride 2 Mg Tabs (Glimepiride) .[name].. 1 q am 11)  Spironolactone 25 Mg Tabs (Spironolactone) .[name].. 1 once daily if bp averages > 130/85 12)  Clonidine Hcl 0.1 Mg Tabs (Clonidine hcl) .[name].. 1 two times a day with metoprolol  Patient Instructions: 1)  Take Clonidine 0.1 mg two times a day  with Metoprolol . 2)  Check your Blood Pressure regularly. Your goal = < 135/85 ON AVERAGE. Follow a low carb program (low sugar index or spike & low sugar load) Ex The New Sugar Busters. Avoid white carbs & ALL foods & drinks with High Fructose Corn Syrup  as #1,2 or 3 on label.  Your fasting sugar goal= 70-130 & 2 hrs after any meal < 180,ideally < 160. Bring your Glucometer to next appt to verify its accuracy. 3)  Please schedule a follow-up appointment in 2 months. Prescriptions: CLONIDINE HCL 0.1 MG TABS (CLONIDINE HCL) 1 two times a day with Metoprolol  #60 x 1   Entered and Authorized by:   Marga Melnick MD   Signed by:   Marga Melnick MD on 12/18/2008   Method used:   Print then Give to Patient   RxID:   [id number]

## 2010-08-03 NOTE — Assessment & Plan Note (Signed)
Summary: congestion//kn   Vital Signs:  Patient profile:   65 year old male Weight:      240.2 pounds Temp:     97.7 degrees F oral Pulse rate:   60 / minute Resp:     15 per minute BP sitting:   140 / 88  (left arm) Cuff size:   large  Vitals Entered By: Georgette Dover (December 20, 2009 1:31 PM) CC: Congestion  Comments REVIEWED MED LIST, PATIENT AGREED DOSE AND INSTRUCTION CORRECT    CC:  Congestion .  History of Present Illness: With [facility] exposure he notes chest tightness with scant phlegm for > 1 month. No PMH of asthma. From Dec to Feb he has similar symptoms.  Allergies (verified): No Known Drug Allergies  Review of Systems General:  Denies chills, fever, and sweats. ENT:  Denies nasal congestion and sinus pressure; No frontal headache, facial pain or purulence. Resp:  Denies chest pain with inspiration, coughing up blood, pleuritic, shortness of breath, and wheezing. Allergy:  Denies itching eyes and sneezing.   Impression & Recommendations:  Problem # 1:  COUGH (Z2878448.2)  RAD suggested. Note: on ACE-I  Orders: T-2 View CXR (71020TC)  Complete Medication List: 1)  Verapamil Hcl Cr 240 Mg Tbcr (Verapamil hcl) .... 1/2 by mouth bid 2)  Actoplus Met 15-850mg  Tabs (pioglitazone Hcl-metformin Hcl)  .... Take 1 tablet bid with 2  largest meals 3)  Freestyle Test Strp (Glucose blood) .... Check as directed 4)  Metoprolol Tartrate 100 Mg Tabs (Metoprolol tartrate) .... Take 1 tablet 2 times a day 5)  Viagra 100 Mg Tabs (Sildenafil citrate) .... Use as directed - do not take with nitroglycerin 6)  Pravastatin Sodium 40 Mg Tabs (Pravastatin sodium) .[name].. 1 qhs 7)  Allopurinol 300 Mg Tabs (Allopurinol) .... 1/2  daily as gout prevention(do not take if gout active) 8)  Glimepiride 2 Mg Tabs (Glimepiride) .[name].. 1 q am 9)  Spironolactone 25 Mg Tabs (Spironolactone) .[name].. 1 once daily if bp averages > 130/85 10)  Clonidine Hcl 0.1 Mg Tabs (Clonidine hcl) .[name].. 1 two times a day  with metoprolol 11)  Onetouch Ultra Test Strp (Glucose blood) .... Test as directed 12)  Losartan Potassium 100 Mg Tabs (Losartan potassium) .[name].. 1 once daily 13)  Advair Diskus 100-50 Mcg/dose Aepb (Fluticasone-salmeterol) .[name].. 1 inhalation every 12 hrs ; gargle & spit after use 14)  Singulair 10 Mg Tabs (Montelukast sodium) .[name].. 1 once daily  Patient Instructions: 1)  CXray @ [place] . Avoid cold environment & extremely beverages. Prescriptions: SINGULAIR 10 MG TABS (MONTELUKAST SODIUM) 1 once daily  #30 x 5   Entered and Authorized by:   Unice Cobble MD   Signed by:   Unice Cobble MD on 12/20/2009   Method used:   Print then Give to Patient   RxID:   [id number] ADVAIR DISKUS 100-50 MCG/DOSE AEPB (FLUTICASONE-SALMETEROL) 1 inhalation every 12 hrs ; gargle & spit after use  #1 x 5   Entered and Authorized by:   Unice Cobble MD   Signed by:   Unice Cobble MD on 12/20/2009   Method used:   Print then Give to Patient   RxID:   [id number]

## 2010-08-03 NOTE — Progress Notes (Signed)
Summary: Hop--medicine for swelling  Phone Note Call from Patient Call back at [phone]   Caller: Patient Summary of Call: Pt is calling for medicine to get the swelling out of his ankle. Was seen for gout last week. Phar--[facility] [address] Initial call taken by: Freddy Jaksch,  January 07, 2008 10:35 AM  Follow-up for Phone Call        DR.HOPPER PLEASE ADVISE, NOT PRINTED AND PLACED ON LEDGE. Follow-up by: Shonna Chock,  January 07, 2008 10:47 AM  Additional Follow-up for Phone Call Additional follow up Details #1::        Colchicine 0.6 mg two times a day # 20 & renew Indocin Er 75 mg #14. He needs uric acid (274.9) Additional Follow-up by: Marga Melnick MD,  January 07, 2008 2:05 PM    Additional Follow-up for Phone Call Additional follow up Details #2::    [facility] pt has been informed and labs scheduled for tomorrow.Kandice Hams  January 07, 2008 3:16 PM  Follow-up by: Kandice Hams,  January 07, 2008 3:16 PM  New/Updated Medications: COLCHICINE 0.6 MG  TABS (COLCHICINE) 1 by mouth two times a day   Prescriptions: INDOMETHACIN CR 75 MG  CPCR (INDOMETHACIN) 1 two times a day with ameal  #14 x 0   Entered by:   Kandice Hams   Authorized by:   Marga Melnick MD   Signed by:   Kandice Hams on 01/07/2008   Method used:   Printed then faxed to ...       [facility] [phone]*       [address] [place], [place]  [address]       Ph: [phone] or [phone]       Fax: [phone]   RxID:   [id number] COLCHICINE 0.6 MG  TABS (COLCHICINE) 1 by mouth two times a day  #20 x 0   Entered by:   Kandice Hams   Authorized by:   Marga Melnick MD   Signed by:   Kandice Hams on 01/07/2008   Method used:   Printed then faxed to ...       [facility]  [facility] [phone]*       [address] [place], [place]  [address]       Ph: [phone] or [phone]       Fax: [phone]   RxID:   [id number]

## 2010-08-03 NOTE — Letter (Signed)
Summary: Results Follow up Letter  [facility] [place]  [address] [place], [place] [address]   Phone: [phone]  Fax: [phone]    12/22/2008 MRN: [id number]  [address] [address] [place], [place]  [address]  Dear [name],  The following are the results of your recent test(s):  Test         Result    Pap Smear:        Normal _____  Not Normal _____ Comments: ______________________________________________________ Cholesterol: LDL(Bad cholesterol):         Your goal is less than:         HDL (Good cholesterol):       Your goal is more than: Comments:  ______________________________________________________ Mammogram:        Normal _____  Not Normal _____ Comments:  ___________________________________________________________________ Hemoccult:        Normal _____  Not normal _______ Comments:    _____________________________________________________________________ Other Tests: PLEASE SEE ATTACHED LABS DONE ON 12/11/2008    We routinely do not discuss normal results over the telephone.  If you desire a copy of the results, or you have any questions about this information we can discuss them at your next office visit.   Sincerely,

## 2010-08-03 NOTE — Assessment & Plan Note (Signed)
Summary: fill out form for job.cbs   Vital Signs:  Patient Profile:   65 Years Old Male Weight:      238.4 pounds Temp:     98.1 degrees F oral Pulse rate:   60 / minute Resp:     15 per minute BP sitting:   138 / 96  (left arm) Cuff size:   large  Vitals Entered By: Shonna Chock (September 04, 2008 11:33 AM)                 Chief Complaint:  FILL OUT FORM FOR WORK and Type 2 diabetes mellitus follow-up.  History of Present Illness: BP erratic 130s/80s- 140s/90s. FBS 101-141; 2 hrs post meal < 202("I ate sweets"). No regular exercise program.  Type 2 Diabetes Mellitus Follow-Up      This is a 65 year old man who presents for Type 2 diabetes mellitus follow-up.  The patient denies polyuria, polydipsia, blurred vision, self managed hypoglycemia, hypoglycemia requiring help, weight loss, weight gain, and numbness of extremities.  The patient denies the following symptoms: neuropathic pain, chest pain, vomiting, orthostatic symptoms, poor wound healing, intermittent claudication, vision loss, and foot ulcer.  Since the last visit the patient reports poor dietary compliance, compliance with medications, not exercising regularly, and monitoring blood glucose.  The patient has been measuring capillary blood glucose before breakfast and after dinner.  Since the last visit, the patient reports having had eye care by an ophthalmologist and foot care by a podiatrist.  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                                                                                                           Form is for rigorous traaining ; "I don't think I can do it becaause of knees & throwing people & being thrown". Last CV evaluation 12 /08    Current Allergies (reviewed today): No known allergies       Physical Exam  General:     well-nourished,in no acute distress; alert,appropriate and cooperative throughout examination; cenral obesity Lungs:     Normal  respiratory effort, chest expands symmetrically. Lungs are clear to auscultation, no crackles or wheezes. Heart:     normal rate, regular rhythm, no gallop, no rub, no JVD, no HJR, and grade 1 /6 systolic murmur.   Abdomen:     Bowel sounds positive,abdomen soft and non-tender without masses, organomegaly or hernias noted.Protuberant Pulses:     R and L carotid,radial,dorsalis pedis and posterior tibial pulses are full and equal bilaterally Extremities:     1+ left pedal edema and 2+ right pedal edema.  Mild crepitus of knees Neurologic:     alert & oriented X3.   Skin:     Intact without suspicious lesions or rashes Psych:     memory intact for recent and remote, normally interactive, and good eye contact.   Denial suggested ; he was unaware of edema. Nutrition consult declined    Impression & Recommendations:  Problem # 1:  DIABETES MELLITUS,  TYPE II, UNCONTROLLED (ICD-250.02)  His updated medication list for this problem includes:    Benazepril Hcl 40 Mg Tabs (Benazepril hcl) .[name]... 1 by mouth bid    Glimepiride 2 Mg Tabs (Glimepiride) .[name]... 1 q am  Orders: Venipuncture ([address]) TLB-A1C / Hgb A1C (Glycohemoglobin) (83036-A1C)   Problem # 2:  HYPERTENSION, ESSENTIAL NOS (ICD-401.9)  His updated medication list for this problem includes:    Verapamil Hcl Cr 240 Mg Tbcr (Verapamil hcl) .[name]... 1/2 by mouth bid    Metoprolol Tartrate 100 Mg Tabs (Metoprolol tartrate) .[name]... Take 1 tablet 2 times a day    Benazepril Hcl 40 Mg Tabs (Benazepril hcl) .[name]... 1 by mouth bid    Spironolactone 25 Mg Tabs (Spironolactone) .[name]... 1 once daily if bp averages > 130/85  Orders: EKG w/ Interpretation (93000) Venipuncture ([address]) TLB-Creatinine, Blood (82565-CREA) TLB-Potassium (K+) (84132-K) TLB-Uric Acid, Blood (84550-URIC) TLB-BUN (Urea Nitrogen) (84520-BUN)   Problem # 3:  HYPERLIPIDEMIA (ICD-272.2)  His updated medication list for this problem includes:    Pravastatin Sodium 40 Mg  Tabs (Pravastatin sodium) .[name]... 1 qhs   Problem # 4:  EDEMA- LOCALIZED (ICD-782.3)  Orders: Venipuncture ([address]11)  His updated medication list for this problem includes:    Spironolactone 25 Mg Tabs (Spironolactone) .[name]... 1 once daily if bp averages > 130/85   Problem # 5:  ARTHRALGIA (ICD-719.40)  Orders: Venipuncture (91478)   Complete Medication List: 1)  Verapamil Hcl Cr 240 Mg Tbcr (Verapamil hcl) .... 1/2 by mouth bid 2)  Actoplus Met 15-850mg  Tabs (pioglitazone Hcl-metformin Hcl)  .... Take 1 tablet bid with 2  largest meals 3)  Freestyle Test Strp (Glucose blood) .... Check as directed 4)  Metoprolol Tartrate 100 Mg Tabs (Metoprolol tartrate) .... Take 1 tablet 2 times a day 5)  Viagra 100 Mg Tabs (Sildenafil citrate) .... Use as directed 6)  Benazepril Hcl 40 Mg Tabs (Benazepril hcl) .[name].. 1 by mouth bid 7)  Pravastatin Sodium 40 Mg Tabs (Pravastatin sodium) .[name].. 1 qhs 8)  Freestyle Lancets Misc (Lancets) .... Use as directed 9)  Allopurinol 300 Mg Tabs (Allopurinol) .[name].. 1 once daily as gout prevention(do not take if gout active) 10)  Glimepiride 2 Mg Tabs (Glimepiride) .[name].. 1 q am 11)  Spironolactone 25 Mg Tabs (Spironolactone) .[name].. 1 once daily if bp averages > 130/85   Patient Instructions: 1)  Follow The New Sugar Busters book llow carb program. 2)  Check your Blood Pressure regularly. If it is above: 130/85 ON AVERAGE  you should fill the spironolactone.Based on your physical exam & review of the lab studies ,I recommend deferring the fitness tset for 4 months until conditioning & dietary interventions can be initiated. Follow up after those measures in place for @ least 3 months.   Prescriptions: SPIRONOLACTONE 25 MG TABS (SPIRONOLACTONE) 1 once daily IF BP AVERAGES > 130/85  #30 x 2   Entered and Authorized by:   Marga Melnick MD   Signed by:   Marga Melnick MD on 09/04/2008   Method used:   Print then Give to Patient   RxID:   [id number]

## 2010-08-03 NOTE — Letter (Signed)
Summary: Results Follow up Letter  [facility]  [address] [place], [place] [address]   Phone: [phone]  Fax: [phone]    09/08/2008 MRN: [id number]  Noah [address] [place], [place]  [address]  Dear Mr. Seawright,  The following are the results of your recent test(s):  Test         Result    Pap Smear:        Normal _____  Not Normal _____ Comments: ______________________________________________________ Cholesterol: LDL(Bad cholesterol):         Your goal is less than:         HDL (Good cholesterol):       Your goal is more than: Comments:  ______________________________________________________ Mammogram:        Normal _____  Not Normal _____ Comments:  ___________________________________________________________________ Hemoccult:        Normal _____  Not normal _______ Comments:    _____________________________________________________________________ Other Tests: PLEASE SEE ATTACHED LABS DONE ON 09/04/2008 AND APPOINTMENT CARD TO RECHECK LABS IN 14 WEEKS    We routinely do not discuss normal results over the telephone.  If you desire a copy of the results, or you have any questions about this information we can discuss them at your next office visit.   Sincerely,

## 2010-08-03 NOTE — Progress Notes (Signed)
Summary: lmom 01/10/08 labs  Phone Note Outgoing Call   Call placed by: Doristine Devoid,  January 10, 2008 2:17 PM Call placed to: Patient Summary of Call: left message on machine need to inform  -uric acid elevated -start allopurinol after gout attack is over prescription already sent to pharmacy -avoid high fructose corn syrup -recheck labs in 6 weeks bun,creat,k   Follow-up for Phone Call        patient aware of labs and appt scheduled to have labs rechecked........Marland KitchenDoristine Devoid  January 10, 2008 3:16 PM

## 2010-08-03 NOTE — Medication Information (Signed)
Summary: Letter Regarding [facility]  Letter Regarding [facility]   Imported By: Edmonia James 05/17/2010 09:25:25  _____________________________________________________________________  External Attachment:    Type:   Image     Comment:   External Document

## 2010-08-03 NOTE — Progress Notes (Signed)
Summary: rx viagra- dr hopper  Phone Note Call from Patient Call back at Work Phone [phone] Call back at ext [phone]   Caller: Patient Summary of Call: patient had stress test - all test were normal - he would like rx for viagra or levitra if you feel ok with prescribing - [facility]  Initial call taken by: Okey Regal [place],  June 06, 2007 10:51 AM  Follow-up for Phone Call        [facility] left msg for pt...................................................................Marland KitchenKandice Hams  June 06, 2007 11:34 AM  Follow-up by: Kandice Hams,  June 06, 2007 11:34 AM      Prescriptions: VIAGRA 100 MG  TABS (SILDENAFIL CITRATE) use as directed  #12 x 5   Entered by:   Kandice Hams   Authorized by:   Marga Melnick MD   Signed by:   Kandice Hams on 06/06/2007   Method used:   Electronically sent to ...       [facility] [phone]*       [address] [place], [place]  [address]       Ph: [phone] or [phone]       Fax: [phone]   RxID:   [id number] VIAGRA 100 MG  TABS (SILDENAFIL CITRATE) use as directed  #12 x 5   Entered by:   Marga Melnick MD   Authorized by:   Wendall Stade   Signed by:   Marga Melnick MD on 06/06/2007   Method used:   Print then Give to Patient   RxID:   [id number]

## 2010-08-03 NOTE — Letter (Signed)
Summary: Care Consideration Regarding A1C Monitoring/[facility]  Care Consideration Regarding A1C Monitoring/[place] [facility]   Imported By: Edmonia James 03/28/2010 12:56:20  _____________________________________________________________________  External Attachment:    Type:   Image     Comment:   External Document

## 2010-08-03 NOTE — Medication Information (Signed)
Summary: Care Consideration Regarding Drugs Known to Cause Hyperkalemia/H  Care Consideration Regarding Drugs Known to Cause Hyperkalemia/[facility]   Imported By: Edmonia James 02/28/2010 13:00:01  _____________________________________________________________________  External Attachment:    Type:   Image     Comment:   External Document

## 2010-08-03 NOTE — Assessment & Plan Note (Signed)
Summary: GOUT/VGJ   Vital Signs:  Patient Profile:   65 Years Old Male Weight:      237 pounds Pulse rate:   64 / minute Resp:     15 per minute BP sitting:   140 / 80  (left arm)  Pt. in pain?   yes    Location:   L great toe    Intensity:   5    Type:       sharp  Vitals Entered By: Doristine Devoid (December 31, 2007 3:56 PM)                  Chief Complaint:  gout in right big toe x1 week.  History of Present Illness: PMH gout ?2007.Meds reviewed ; not on HCTZ. FBS 89- 145; pc < 120(?)    Current Allergies: No known allergies   Past Medical History:    Hypertension    Diabetes mellitus, type II    Hyperlipidemia    abnormal EKG    erectile dysfunction    dyslipidemia    family history diabetes first degree relative    Gout      Physical Exam  General:     Well-developed,well-nourished,in no acute distress; alert,appropriate and cooperative throughout examination Lungs:     Normal respiratory effort, chest expands symmetrically. Lungs are clear to auscultation, no crackles or wheezes. Heart:     Normal rate and regular rhythm. S1 and S2 normal without gallop, murmur, click, rub. S4 with slurring Abdomen:     Protuberant Pulses:     R and L dorsalis pedis and posterior tibial pulses are full and equal bilaterally Extremities:     trace left pedal edema and 1+ right pedal edema. Erythema over R great toe  Skin:     See foot    Impression & Recommendations:  Problem # 1:  GOUT (ICD-274.9)  His updated medication list for this problem includes:    Indomethacin Cr 75 Mg Cpcr (Indomethacin) .[name]... 1 two times a day with ameal   Problem # 2:  DIABETES-TYPE 2 (ICD-250.00)  His updated medication list for this problem includes:    Benazepril Hcl 40 Mg Tabs (Benazepril hcl) .[name]... 1 by mouth bid   Complete Medication List: 1)  Verapamil Hcl Cr 240 Mg Tbcr (Verapamil hcl) .... 1/2 by mouth bid 2)  Actoplus Met 15-850mg  Tabs (pioglitazone Hcl-metformin  Hcl)  .... Take 1 tablet bid with 2  largest meals 3)  Freestyle Test Strp (Glucose blood) .... Check as directed 4)  Metoprolol Tartrate 100 Mg Tabs (Metoprolol tartrate) .... Take 1 tablet 2 times a day 5)  Viagra 100 Mg Tabs (Sildenafil citrate) .... Use as directed 6)  Benazepril Hcl 40 Mg Tabs (Benazepril hcl) .[name].. 1 by mouth bid 7)  Pravastatin Sodium 40 Mg Tabs (Pravastatin sodium) .[name].. 1 qhs 8)  Freestyle Lancets Misc (Lancets) .... Use as directed 9)  Indomethacin Cr 75 Mg Cpcr (Indomethacin) .[name].. 1 two times a day with ameal   Patient Instructions: 1)  Avoid High Fructose Corn Syrup as #1,2,or #3 on label. Follow The New Sugar Busters. 2)  Check your blood sugars regularly. If your AM fasting  readings are usually above :130 or below 70 AND sugar 2 hrs after ANY  meal > 160  you should contact our office.   Prescriptions: INDOMETHACIN CR 75 MG  CPCR (INDOMETHACIN) 1 two times a day with ameal  #14 x 0   Entered and Authorized by:  Marga Melnick MD   Signed by:   Marga Melnick MD on 12/31/2007   Method used:   Electronically sent to ...       [facility] [phone]*       [address] [place], [place]  [address]       Ph: [phone] or [phone]       Fax: [phone]   RxID:   [id number] INDOMETHACIN CR 75 MG  CPCR (INDOMETHACIN) 1 two times a day with ameal  #14 x 0   Entered and Authorized by:   Marga Melnick MD   Signed by:   Marga Melnick MD on 12/31/2007   Method used:   Electronically sent to ...       [facility] [phone]*       [address] [place], [place]  [address]       Ph: [phone] or [phone]       Fax: [phone]   RxID:   [id number]  ]

## 2010-08-03 NOTE — Medication Information (Signed)
Summary: Letter Regarding Adding a Statin/[facility]  Letter Regarding Adding a Statin/[facility]   Imported By: Edmonia James 09/29/2009 10:32:49  _____________________________________________________________________  External Attachment:    Type:   Image     Comment:   External Document

## 2010-08-03 NOTE — Progress Notes (Signed)
Summary: HOPPER-REFILL FREESTYLE LANCETS  Phone Note Refill Request   Refills Requested: Medication #1:  FREESTYLE LANCETS CHECK AS DIRECTED BY FAX [id number] [phone] LAST FILLED #100 ON 09.28.07  Initial call taken by: Gwen Pounds,  July 08, 2007 9:54 AM    New/Updated Medications: FREESTYLE LANCETS   MISC (LANCETS) use as directed   Prescriptions: FREESTYLE LANCETS   MISC (LANCETS) use as directed  #100 x 3   Entered by:   Wendall Stade   Authorized by:   Marga Melnick MD   Signed by:   Wendall Stade on 07/08/2007   Method used:   Electronically sent to ...       [facility] [phone]*       [address] [place], [place]  [address]       Ph: [phone] or [phone]       Fax: [phone]   RxID:   [id number]

## 2010-08-03 NOTE — Progress Notes (Signed)
  Phone Note Call from Patient Call back at [facility] Phone [phone]   Caller: Patient Summary of Call: PT CALLED LEFT MSG VIAGRA RX SENT TO WRONG PHRMACY NEED TO SEND TO [facility] [address] RX REFAXED Initial call taken by: Kandice Hams,  March 05, 2009 3:38 PM    Prescriptions: VIAGRA 100 MG  TABS (SILDENAFIL CITRATE) use as directed - DO NOT TAKE WITH NITROGLYCERIN  #6 x 0   Entered by:   Kandice Hams   Authorized by:   Nolon Rod. Paz MD   Signed by:   Kandice Hams on 03/05/2009   Method used:   Faxed to ...       [facility] [phone]* (retail)       [address] [place], [place]  [address]       Ph: [phone] or [phone]       Fax: [phone]   RxID:   [id number]

## 2010-08-03 NOTE — Progress Notes (Signed)
Summary: rx via fax for micardis and benazepril  Phone Note Refill Request   Refills Requested: Medication #1:  micardis 80mg  tab   Last Refilled: 04/04/2007  Medication #2:  benazepril hcl 40mg  tab   Last Refilled: 02/27/2007 rx [facility] fax # is [phone]  Initial call taken by: Job Founds,  May 03, 2007 12:33 PM    New/Updated Medications: MICARDIS 80 MG  TABS (TELMISARTAN) 1 by mouth once daily - due ov BENAZEPRIL HCL 40 MG  TABS (BENAZEPRIL HCL) 1 by mouth once daily - due ov   Prescriptions: BENAZEPRIL HCL 40 MG  TABS (BENAZEPRIL HCL) 1 by mouth once daily - due ov  #30 x 0   Entered by:   Shary Decamp   Authorized by:   Marga Melnick MD   Signed by:   Shary Decamp on 05/03/2007   Method used:   Electronically sent to ...       [facility] [phone]*       [address] [place], [place]  [address]       Ph: [phone] or [phone]       Fax: [phone]   RxID:   [id number] MICARDIS 80 MG  TABS (TELMISARTAN) 1 by mouth once daily - due ov  #30 x 0   Entered by:   Shary Decamp   Authorized by:   Marga Melnick MD   Signed by:   Shary Decamp on 05/03/2007   Method used:   Electronically sent to ...       [facility] [phone]*       [address] [place], [place]  [address]       Ph: [phone] or [phone]       Fax: [phone]   RxID:   [id number]

## 2010-08-03 NOTE — Letter (Signed)
Summary: Results Follow up Letter  [facility] [place]  [address] [place], [place] [address]   Phone: [phone]  Fax: [phone]    05/20/2007 MRN: [id number]  [facility] [address] [place], [place]  [address]  Dear Mr. Salmons,  The following are the results of your recent test(s):  Test         Result    Pap Smear:        Normal _____  Not Normal _____ Comments: ______________________________________________________ Cholesterol: LDL(Bad cholesterol):         Your goal is less than:         HDL (Good cholesterol):       Your goal is more than: Comments:  ______________________________________________________ Mammogram:        Normal _____  Not Normal _____ Comments:  ___________________________________________________________________ Hemoccult:        Normal _____  Not normal _______ Comments:    _____________________________________________________________________ Other Tests:  Please see attached results and comments   We routinely do not discuss normal results over the telephone.  If you desire a copy of the results, or you have any questions about this information we can discuss them at your next office visit.   Sincerely,

## 2010-08-03 NOTE — Assessment & Plan Note (Signed)
Summary: roa 6 months,review lab/cbs   Vital Signs:  Patient Profile:   65 Years Old Male Weight:      236.2 pounds Temp:     98.2 degrees F oral Pulse rate:   64 / minute BP sitting:   134 / 90  (left arm) Cuff size:   large  Vitals Entered By: Shonna Chock (February 11, 2008 9:11 AM)             Comments FYI FASTING BLOOD SUGAR THIS MORNING =134     Chief Complaint:  6 MONTH FOLLOW-UP, DISCUSS LABS, and Type 2 diabetes mellitus follow-up.  History of Present Illness: Labs 02/04/08 & 2/09 & risks reviewed. LDL @ goal ; A1c has 34% risk. No CVE  Type 2 Diabetes Mellitus Follow-Up      This is a 65 year old man who presents for Type 2 diabetes mellitus follow-up.  FBS 130s-140s; pc < 120.  The patient denies polyuria, polydipsia, blurred vision, self managed hypoglycemia, hypoglycemia requiring help, weight loss, weight gain, and numbness of extremities.  The patient denies the following symptoms: neuropathic pain, chest pain, vomiting, orthostatic symptoms, poor wound healing, intermittent claudication, vision loss, and foot ulcer.  Since the last visit the patient reports poor dietary compliance, compliance with medications, not exercising regularly, and monitoring blood glucose.  The patient has been measuring capillary blood glucose before breakfast and after dinner.  Since the last visit, the patient reports having had no eye care and foot care by a podiatris ( q 6 months).      Current Allergies (reviewed today): No known allergies       Physical Exam  General:     well-nourished,in no acute distress; alert,appropriate and cooperative throughout examination Lungs:     Normal respiratory effort, chest expands symmetrically. Lungs are clear to auscultation, no crackles or wheezes. Heart:     normal rate, regular rhythm, no gallop, no rub, no JVD, and grade 1 /6 systolic murmur.   Pulses:     R and L carotid,radial,dorsalis pedis and posterior tibial pulses are full and  equal bilaterally Extremities:     1+ left pedal edema and 1+ right pedal edema.   Skin:     Intact without suspicious lesions or rashes    Impression & Recommendations:  Problem # 1:  DIABETES MELLITUS, TYPE II, UNCONTROLLED (ICD-250.02)  His updated medication list for this problem includes:    Benazepril Hcl 40 Mg Tabs (Benazepril hcl) .[name]... 1 by mouth bid    Glimepiride 2 Mg Tabs (Glimepiride) .[name]... 1 q am   Problem # 2:  HYPERTENSION, ESSENTIAL NOS (ICD-401.9)  His updated medication list for this problem includes:    Verapamil Hcl Cr 240 Mg Tbcr (Verapamil hcl) .[name]... 1/2 by mouth bid    Metoprolol Tartrate 100 Mg Tabs (Metoprolol tartrate) .[name]... Take 1 tablet 2 times a day    Benazepril Hcl 40 Mg Tabs (Benazepril hcl) .[name]... 1 by mouth bid   Problem # 3:  HYPERLIPIDEMIA (ICD-272.2) controlled His updated medication list for this problem includes:    Pravastatin Sodium 40 Mg Tabs (Pravastatin sodium) .[name]... 1 qhs   Complete Medication List: 1)  Verapamil Hcl Cr 240 Mg Tbcr (Verapamil hcl) .... 1/2 by mouth bid 2)  Actoplus Met 15-850mg  Tabs (pioglitazone Hcl-metformin Hcl)  .... Take 1 tablet bid with 2  largest meals 3)  Freestyle Test Strp (Glucose blood) .... Check as directed 4)  Metoprolol Tartrate 100 Mg Tabs (Metoprolol tartrate) .Marland KitchenMarland KitchenMarland Kitchen  Take 1 tablet 2 times a day 5)  Viagra 100 Mg Tabs (Sildenafil citrate) .... Use as directed 6)  Benazepril Hcl 40 Mg Tabs (Benazepril hcl) .[name].. 1 by mouth bid 7)  Pravastatin Sodium 40 Mg Tabs (Pravastatin sodium) .[name].. 1 qhs 8)  Freestyle Lancets Misc (Lancets) .... Use as directed 9)  Allopurinol 300 Mg Tabs (Allopurinol) .[name].. 1 once daily as gout prevention(do not take if gout active) 10)  Glimepiride 2 Mg Tabs (Glimepiride) .[name].. 1 q am   Patient Instructions: 1)  Please schedule a follow-up appointment in 3 months. 2)  HbgA1C prior to visit, ICD-9:250.02 3)  Urine Microalbumin prior to visit, ICD-9:250.02. Follow The New  Sugar Busters low glycemic diet & exercise as discussed   Prescriptions: VIAGRA 100 MG  TABS (SILDENAFIL CITRATE) use as directed  #12 x 1   Entered and Authorized by:   Marga Melnick MD   Signed by:   Marga Melnick MD on 02/11/2008   Method used:   Print then Give to Patient   RxID:   [id number] VIAGRA 100 MG  TABS (SILDENAFIL CITRATE) use as directed  #12 x 1   Entered and Authorized by:   Marga Melnick MD   Signed by:   Marga Melnick MD on 02/11/2008   Method used:   Electronically sent to ...       [facility] [phone]*       [address] [place], [place]  [address]       Ph: [phone] or [phone]       Fax: [phone]   RxID:   [id number] GLIMEPIRIDE 2 MG  TABS (GLIMEPIRIDE) 1 q am  #90 x 1   Entered and Authorized by:   Marga Melnick MD   Signed by:   Marga Melnick MD on 02/11/2008   Method used:   Print then Give to Patient   RxID:   [id number] PRAVASTATIN SODIUM 40 MG  TABS (PRAVASTATIN SODIUM) 1 qhs  #90 x 1   Entered and Authorized by:   Marga Melnick MD   Signed by:   Marga Melnick MD on 02/11/2008   Method used:   Print then Give to Patient   RxID:   [id number] BENAZEPRIL HCL 40 MG  TABS (BENAZEPRIL HCL) 1 by mouth bid  #180 x 1   Entered and Authorized by:   Marga Melnick MD   Signed by:   Marga Melnick MD on 02/11/2008   Method used:   Print then Give to Patient   RxID:   [id number] METOPROLOL TARTRATE 100 MG  TABS (METOPROLOL TARTRATE) take 1 tablet 2 times a day  #180 x 1   Entered and Authorized by:   Marga Melnick MD   Signed by:   Marga Melnick MD on 02/11/2008   Method used:   Print then Give to Patient   RxID:   [id number] ACTOPLUS MET 15-850MG  TABS (PIOGLITAZONE HCL-METFORMIN HCL) take 1 tablet bid with 2  largest meals  #60 x 5   Entered and Authorized by:   Marga Melnick MD   Signed by:   Marga Melnick MD on 02/11/2008   Method used:   Print then Give to Patient   RxID:    [id number] VERAPAMIL HCL CR 240 MG TBCR (VERAPAMIL HCL) 1/2 by mouth bid  #90 x 1   Entered and Authorized by:   Marga Melnick MD   Signed by:   Marga Melnick MD  on 02/11/2008   Method used:   Print then Give to Patient   RxID:   [id number] ALLOPURINOL 300 MG  TABS (ALLOPURINOL) 1 once daily as gout prevention(DO NOT TAKE IF GOUT ACTIVE)  #90 x 3   Entered and Authorized by:   Marga Melnick MD   Signed by:   Marga Melnick MD on 02/11/2008   Method used:   Print then Give to Patient   RxID:   [id number]  ]

## 2010-08-03 NOTE — Assessment & Plan Note (Signed)
Summary: review lab/cbs   Vital Signs:  Patient profile:   65 year old male Weight:      239.8 pounds BMI:     37.14 Pulse rate:   56 / minute Resp:     14 per minute BP sitting:   122 / 80  (left arm) Cuff size:   large  Vitals Entered By: Georgette Dover CMA (June 08, 2010 8:03 AM) CC: Follow-up visit: discuss labs (patient given copy) , Type 2 diabetes mellitus follow-up   CC:  Follow-up visit: discuss labs (patient given copy)  and Type 2 diabetes mellitus follow-up.  History of Present Illness: Type 2 Diabetes Mellitus Follow-Up      This is a 64 year old man who presents for Type 2 diabetes mellitus follow-up.  The patient reports weight loss of 4#, but denies polyuria, polydipsia, blurred vision, self managed hypoglycemia, and numbness of extremities.  The patient denies the following symptoms: neuropathic pain, vomiting, and orthostatic symptoms.  Since the last visit the patient reports good dietary compliance (no sugar, increased vegetables) and  increased exercise(coaching basketball).  The patient has been measuring capillary blood glucose before breakfast, 87-112.Two hrs post meal high value = < 130  . A1c 6.3%(was 6.9%) & LDL 94(114). Risks discussed. Now on Glimiperide & Actoplusmet. Possible GU risks of Actos discussed & meds changed.  Current Medications (verified): 1)  Verapamil Hcl Cr 240 Mg Tbcr (Verapamil Hcl) .... 1/2 By Mouth Bid 2)  Actoplus Met 15-850mg  Tabs (Pioglitazone Hcl-Metformin Hcl) .... Take 1 Tablet Bid With 2  Largest Meals 3)  Viagra 100 Mg  Tabs (Sildenafil Citrate) .... Use As Directed - Do Not Take With Nitroglycerin 4)  Pravastatin Sodium 40 Mg  Tabs (Pravastatin Sodium) .[name].. 1 Qhs 5)  Allopurinol 300 Mg  Tabs (Allopurinol) .... 1/2  Daily As Gout Prevention(Do Not Take If Gout Active) 6)  Glimepiride 2 Mg  Tabs (Glimepiride) .[name].. 1 Q Am 7)  Spironolactone 25 Mg Tabs (Spironolactone) .[name].. 1 Once Daily If Bp Averages > 130/85 8)  Onetouch Ultra  Test  Strp (Glucose Blood) .... Test As Directed 9)  Losartan Potassium 100 Mg Tabs (Losartan Potassium) .[name].. 1 Once Daily 10)  Advair Diskus 100-50 Mcg/dose Aepb (Fluticasone-Salmeterol) .[name].. 1 Inhalation Every 12 Hrs ; Gargle & Spit After Use 11)  Singulair 10 Mg Tabs (Montelukast Sodium) .[name].. 1 Once Daily 12)  Carvedilol 12.5 Mg Tabs (Carvedilol) .... 1/2 Two Times A Day ; Increase To 1 Two Times A Day If Bp Averages > 135/85  Allergies (verified): No Known Drug Allergies  Physical Exam  General:  well-nourished;alert,appropriate and cooperative throughout examination Lungs:  Normal respiratory effort, chest expands symmetrically. Lungs are clear to auscultation, no crackles or wheezes. Heart:  regular rhythm, no gallop, no rub, no JVD, bradycardia, and grade 0000000  /6 systolic murmur LUSB.   Pulses:  R and L carotid,radial,dorsalis pedis and posterior tibial pulses are full and equal bilaterally Extremities:  trace left & R  pedal edema.   Good nail health Neurologic:  alert & oriented X3 and sensation intact to light touch over feet.   Skin:  Intact without suspicious lesions or rashes Psych:  Focused & motivated; intelligent questions   Impression & Recommendations:  Problem # 1:  DIABETES MELLITUS, TYPE II, WITHOUT COMPLICATIONS ([id number]) excellent control, but potential adverse drug risks His updated medication list for this problem includes:    Glimepiride 2 Mg Tabs (Glimepiride) .[name]... 1/2 once daily  Losartan Potassium 100 Mg Tabs (Losartan potassium) .[name]... 1 once daily    Janumet 50-500 Mg Tabs (Sitagliptin-metformin hcl) .[name]... 1 two times a day with 2 largest meals  Problem # 2:  HYPERLIPIDEMIA (ICD-272.2) Lipids @ goal His updated medication list for this problem includes:    Pravastatin Sodium 40 Mg Tabs (Pravastatin sodium) .[name]... 1 qhs  Problem # 3:  HYPERTENSION (ICD-401.9) controlled His updated medication list for this problem includes:    Verapamil Hcl Cr  240 Mg Tbcr (Verapamil hcl) .[name]... 1/2 by mouth bid    Spironolactone 25 Mg Tabs (Spironolactone) .[name]... 1 once daily if bp averages > 130/85    Losartan Potassium 100 Mg Tabs (Losartan potassium) .[name]... 1 once daily    Carvedilol 12.5 Mg Tabs (Carvedilol) .[name]... 1/2 two times a day ; increase to 1 two times a day if bp averages > 135/85  Complete Medication List: 1)  Verapamil Hcl Cr 240 Mg Tbcr (Verapamil hcl) .... 1/2 by mouth bid 2)  Viagra 100 Mg Tabs (Sildenafil citrate) .... Use as directed - do not take with nitroglycerin 3)  Pravastatin Sodium 40 Mg Tabs (Pravastatin sodium) .[name].. 1 qhs 4)  Allopurinol 300 Mg Tabs (Allopurinol) .... 1/2  daily as gout prevention(do not take if gout active) 5)  Glimepiride 2 Mg Tabs (Glimepiride) .... 1/2 once daily 6)  Spironolactone 25 Mg Tabs (Spironolactone) .[name].. 1 once daily if bp averages > 130/85 7)  Onetouch Ultra Test Strp (Glucose blood) .... Test as directed 8)  Losartan Potassium 100 Mg Tabs (Losartan potassium) .[name].. 1 once daily 9)  Advair Diskus 100-50 Mcg/dose Aepb (Fluticasone-salmeterol) .[name].. 1 inhalation every 12 hrs ; gargle & spit after use 10)  Singulair 10 Mg Tabs (Montelukast sodium) .[name].. 1 once daily 11)  Carvedilol 12.5 Mg Tabs (Carvedilol) .... 1/2 two times a day ; increase to 1 two times a day if bp averages > 135/85 12)  Janumet 50-500 Mg Tabs (Sitagliptin-metformin hcl) .[name].. 1 two times a day with 2 largest meals  Patient Instructions: 1)  Please schedule a follow-up appointment in 5  months. 2)  BUN,creat,K+ prior to visit, ICD-9:401.9 3)  Lipid Panel prior to visit, ICD-9:272.4 4)  HbgA1C prior to visit, ICD-9:250.00 5)  Urine Microalbumin prior to visit, ICD-9:250.00 6)  It is important that you exercise regularly at least 20 minutes 5 times a week. If you develop chest pain, have severe difficulty breathing, or feel very tired , stop exercising immediately and seek medical attention. 7)  Take an  81 mg coated Aspirin  every day. 8)  Check your blood sugars regularly. If your readings are usually above :150 or below 90 you should contact our office. 9)  See your eye doctor yearly to check for diabetic eye damage. 10)  Check your feet each night for sore areas, calluses or signs of infection. Prescriptions: JANUMET 50-500 MG TABS (SITAGLIPTIN-METFORMIN HCL) 1 two times a day with 2 largest meals  #60 x 5   Entered and Authorized by:   Unice Cobble MD   Signed by:   Unice Cobble MD on 06/08/2010   Method used:   Print then Give to Patient   RxID:   [id number]    Orders Added: 1)  Est. Patient Level III [id number]

## 2010-08-03 NOTE — Assessment & Plan Note (Signed)
Summary: REVIEW LAB/CBS   Vital Signs:  Patient profile:   65 year old male Weight:      243.6 pounds BMI:     37.73 Temp:     98.5 degrees F oral Pulse rate:   56 / minute Resp:     15 per minute BP sitting:   102 / 60  (left arm) Cuff size:   large  Vitals Entered By: Georgette Dover CMA (March 02, 2010 8:12 AM)  Nutrition Counseling: Patient's BMI is greater than 25 and therefore counseled on weight management options. CC: Follow-up visit: Discuss Labs (copy given) and refill meds , Type 2 diabetes mellitus follow-up Comments Patient refused flu vaccine today   CC:  Follow-up visit: Discuss Labs (copy given) and refill meds  and Type 2 diabetes mellitus follow-up.  History of Present Illness: Hyperlipidemia Follow-Up      This is a 65 year old man who presents for Hyperlipidemia follow-up.  The patient reports constipation, but denies muscle aches, GI upset, abdominal pain, flushing, itching, diarrhea, and fatigue.  The patient denies the following symptoms: chest pain/pressure, exercise intolerance, dypsnea, palpitations, syncope, and pedal edema.  Compliance with medications (by patient report) has been near 100%.  Dietary compliance has been fair.  The patient reports no exercise.  Adjunctive measures currently used by the patient include ASA.  LDL 114 , T G 78 & HDL 54.6 on Pravastatin 40 mg at bedtime.  Type 2 Diabetes Mellitus Follow-Up      The patient is also here for Type 2 diabetes mellitus follow-up.  The patient denies polyuria, polydipsia, blurred vision, self managed hypoglycemia, weight loss, weight gain, and numbness of extremities.  The patient denies the following symptoms: neuropathic pain, vomiting, orthostatic symptoms, poor wound healing, intermittent claudication, vision loss, and foot ulcer.  The patient has been measuring capillary blood glucose before breakfast  ( 122-135)and after lunch ( < 100). A1c up from 6.3% to 6.9%  with risk long term of 38%.  Since the  last visit, the patient reports having had eye care by an ophthalmologist and no foot care.  Roleof HFCS sugar in DM discussed.On ActoPlusMet ; no edema, weight gain or nocturnal dyspnea. Hypertension Follow-Up      The patient also presents for Hypertension follow-up.  The patient denies lightheadedness and headaches.  Compliance with medications (by patient report) has been near 100%.  Adjunctive measures currently used by the patient include salt restriction.BP not checked @ home.  Current Medications (verified): 1)  Verapamil Hcl Cr 240 Mg Tbcr (Verapamil Hcl) .... 1/2 By Mouth Bid 2)  Actoplus Met 15-850mg  Tabs (Pioglitazone Hcl-Metformin Hcl) .... Take 1 Tablet Bid With 2  Largest Meals 3)  Metoprolol Tartrate 100 Mg  Tabs (Metoprolol Tartrate) .... Take 1 Tablet 2 Times A Day 4)  Viagra 100 Mg  Tabs (Sildenafil Citrate) .... Use As Directed - Do Not Take With Nitroglycerin 5)  Pravastatin Sodium 40 Mg  Tabs (Pravastatin Sodium) .[name].. 1 Qhs 6)  Allopurinol 300 Mg  Tabs (Allopurinol) .... 1/2  Daily As Gout Prevention(Do Not Take If Gout Active) 7)  Glimepiride 2 Mg  Tabs (Glimepiride) .[name].. 1 Q Am 8)  Spironolactone 25 Mg Tabs (Spironolactone) .[name].. 1 Once Daily If Bp Averages > 130/85 9)  Clonidine Hcl 0.1 Mg Tabs (Clonidine Hcl) .[name].. 1 Two Times A Day With Metoprolol 10)  Onetouch Ultra Test  Strp (Glucose Blood) .... Test As Directed 11)  Losartan Potassium 100 Mg Tabs (Losartan Potassium) .Marland KitchenMarland KitchenMarland Kitchen  1 Once Daily 12)  Advair Diskus 100-50 Mcg/dose Aepb (Fluticasone-Salmeterol) .[name].. 1 Inhalation Every 12 Hrs ; Gargle & Spit After Use 13)  Singulair 10 Mg Tabs (Montelukast Sodium) .[name].. 1 Once Daily  Allergies (verified): No Known Drug Allergies  Physical Exam  General:  well-nourished; alert,appropriate and cooperative throughout examination Neck:  No deformities, masses, or tenderness noted. Lungs:  Normal respiratory effort, chest expands symmetrically. Lungs are clear to auscultation, no  crackles or wheezes. Heart:  Normal rate and regular rhythm. S1 and S2 normal without gallop, murmur, click, rub or other extra sounds. Abdomen:  Bowel sounds positive,abdomen soft and non-tender without masses, organomegaly or hernias noted. Pulses:  R and L carotid,radial,dorsalis pedis and posterior tibial pulses are full and equal bilaterally Extremities:  No clubbing, cyanosis, edema. Good nail health Neurologic:  alert & oriented X3 and sensation intact to light touch over feet.   Skin:  Intact without suspicious lesions or rashes Psych:  memory intact for recent and remote, normally interactive, and good eye contact.     Impression & Recommendations:  Problem # 1:  DIABETES MELLITUS, TYPE II, WITHOUT COMPLICATIONS ([id number]) A1c indicates fair to poor control; Nutritional & exercise options elected by Mr Minniear His updated medication list for this problem includes:    Glimepiride 2 Mg Tabs (Glimepiride) .[name]... 1 q am    Losartan Potassium 100 Mg Tabs (Losartan potassium) .[name]... 1 once daily  Problem # 2:  HYPERLIPIDEMIA (ICD-272.2) LDL not @ goal; options discussed. TLC selected rather than med increase or change in meds His updated medication list for this problem includes:    Pravastatin Sodium 40 Mg Tabs (Pravastatin sodium) .[name]... 1 qhs  Problem # 3:  HYPERTENSION (ICD-401.9) controlled The following medications were removed from the medication list:    Metoprolol Tartrate 100 Mg Tabs (Metoprolol tartrate) .[name]... Take 1 tablet 2 times a day    Clonidine Hcl 0.1 Mg Tabs (Clonidine hcl) .[name]... 1 two times a day with metoprolol His updated medication list for this problem includes:    Verapamil Hcl Cr 240 Mg Tbcr (Verapamil hcl) .[name]... 1/2 by mouth bid    Spironolactone 25 Mg Tabs (Spironolactone) .[name]... 1 once daily if bp averages > 130/85    Losartan Potassium 100 Mg Tabs (Losartan potassium) .[name]... 1 once daily    Carvedilol 12.5 Mg Tabs (Carvedilol) .[name]... 1/2 two times a day ;  increase to 1 two times a day if bp averages > 135/85  Complete Medication List: 1)  Verapamil Hcl Cr 240 Mg Tbcr (Verapamil hcl) .... 1/2 by mouth bid 2)  Actoplus Met 15-850mg  Tabs (pioglitazone Hcl-metformin Hcl)  .... Take 1 tablet bid with 2  largest meals 3)  Viagra 100 Mg Tabs (Sildenafil citrate) .... Use as directed - do not take with nitroglycerin 4)  Pravastatin Sodium 40 Mg Tabs (Pravastatin sodium) .[name].. 1 qhs 5)  Allopurinol 300 Mg Tabs (Allopurinol) .... 1/2  daily as gout prevention(do not take if gout active) 6)  Glimepiride 2 Mg Tabs (Glimepiride) .[name].. 1 q am 7)  Spironolactone 25 Mg Tabs (Spironolactone) .[name].. 1 once daily if bp averages > 130/85 8)  Onetouch Ultra Test Strp (Glucose blood) .... Test as directed 9)  Losartan Potassium 100 Mg Tabs (Losartan potassium) .[name].. 1 once daily 10)  Advair Diskus 100-50 Mcg/dose Aepb (Fluticasone-salmeterol) .[name].. 1 inhalation every 12 hrs ; gargle & spit after use 11)  Singulair 10 Mg Tabs (Montelukast sodium) .[name].. 1 once daily 12)  Carvedilol 12.5  Mg Tabs (Carvedilol) .... 1/2 two times a day ; increase to 1 two times a day if bp averages > 135/85  Patient Instructions: 1)  Please schedule a follow-up appointment in 3 months. Consume < 40 grams of HFCS sugar/ day as discussed. 2)  It is important that you exercise regularly at least 20 minutes 5 times a week. If you develop chest pain, have severe difficulty breathing, or feel very tired , stop exercising immediately and seek medical attention. 3)  Take an Aspirin every day. 4)  Check your blood sugars regularly. If your readings are usually above  150 or below 90 you should contact our office. 5)  See your eye doctor yearly to check for diabetic eye damage. 6)  Check your feet each night for sore areas, calluses or signs of infection. 7)  Check your Blood Pressure regularly. Your goal = AVERAGE < 135/85. 8)  Hepatic Panel prior to visit, ICD-9:995.20 9)  Lipid Panel prior to visit,  ICD-9:272.4 10)  HbgA1C prior to visit, ICD-9:250.00 Prescriptions: LOSARTAN POTASSIUM 100 MG TABS (LOSARTAN POTASSIUM) 1 once daily  #90 x 1   Entered and Authorized by:   Unice Cobble MD   Signed by:   Unice Cobble MD on 03/02/2010   Method used:   Print then Give to Patient   RxID:   [id number] GLIMEPIRIDE 2 MG  TABS (GLIMEPIRIDE) 1 q am  #90 Each x 1   Entered and Authorized by:   Unice Cobble MD   Signed by:   Unice Cobble MD on 03/02/2010   Method used:   Print then Give to Patient   RxID:   [id number] PRAVASTATIN SODIUM 40 MG  TABS (PRAVASTATIN SODIUM) 1 qhs  #90 x 1   Entered and Authorized by:   Unice Cobble MD   Signed by:   Unice Cobble MD on 03/02/2010   Method used:   Print then Give to Patient   RxID:   [id number] VIAGRA 100 MG  TABS (SILDENAFIL CITRATE) use as directed - DO NOT TAKE WITH NITROGLYCERIN  #6 x 6   Entered and Authorized by:   Unice Cobble MD   Signed by:   Unice Cobble MD on 03/02/2010   Method used:   Print then Give to Patient   RxID:   [id number] CARVEDILOL 12.5 MG TABS (CARVEDILOL) 1/2 two times a day ; increase to 1 two times a day if BP averages > 135/85  #60 x 5   Entered and Authorized by:   Unice Cobble MD   Signed by:   Unice Cobble MD on 03/02/2010   Method used:   Print then Give to Patient   RxID:   [id number]   Appended Document: REVIEW LAB/CBS Note: face to face time 33 min; > 50% in counselling re DM control & Nutrition

## 2010-08-03 NOTE — Letter (Signed)
Summary: Medical Condition Form/[facility]  Medical Condition Form/[place] [facility]   Imported By: Lanelle Bal 09/11/2008 11:41:41  _____________________________________________________________________  External Attachment:    Type:   Image     Comment:   External Document

## 2010-08-03 NOTE — Progress Notes (Signed)
Summary: [facility]--RX  Phone Note Refill Request   Refills Requested: Medication #1:  FREESTYLE LANCETS   MISC use as directed   Notes: ONE TOUCH LANCET PATIENT WOULD LIKE RX FOR LANCETS FOR INSURANCE COVERAGE--[facility] [address]--P--[phone]  Initial call taken by: Freddy Jaksch,  August 31, 2008 11:05 AM      Prescriptions: FREESTYLE LANCETS   MISC (LANCETS) use as directed  #100 x 3   Entered by:   Kandice Hams   Authorized by:   Marga Melnick MD   Signed by:   Kandice Hams on 08/31/2008   Method used:   Faxed to ...       [facility]  [address] [phone]* (retail)       [address] [place], [place]  [address]       Ph: [phone] or [phone]       Fax: [phone]   RxID:   [id number]

## 2010-08-03 NOTE — Assessment & Plan Note (Signed)
Summary: review lab.cbs   Vital Signs:  Patient Profile:   65 Years Old Male Weight:      231.50 pounds Pulse rate:   56 / minute Pulse rhythm:   regular Resp:     17 per minute BP sitting:   140 / 90  (left arm) Cuff size:   large  Pt. in pain?   no  Vitals Entered By: Wendall Stade (August 14, 2007 10:31 AM)                  Chief Complaint:  follow up labs and needs scripts and Type 2 diabetes mellitus follow-up.  History of Present Illness: Present labs compared with 11/08 values . All improved but LDL & A1c not @ goals.Risks & goals discussed & protocol (Lipids) reviewed. Decreased CVE ; 1-2X/week. No fried foods in diet.  Type 2 Diabetes Mellitus Follow-Up      This is a 65 year old man who presents for Type 2 diabetes mellitus follow-up.  FBS (vague answers) "? 135; pc ? < 135.  The patient denies polyuria, polydipsia, blurred vision, self managed hypoglycemia, hypoglycemia requiring help, weight loss, weight gain, and numbness of extremities.  The patient denies the following symptoms: neuropathic pain, chest pain, vomiting, orthostatic symptoms, poor wound healing, intermittent claudication, vision loss, and foot ulcer.  Since the last visit the patient reports poor dietary compliance, compliance with medications, not exercising regularly, and monitoring blood glucose.  The patient has been measuring capillary blood glucose before breakfast and after dinner.  Since the last visit, the patient reports having had no eye care and foot care by a podiatrist.  He completed cardiac & Podiatric evaluations (reviewed)  Hypertension History:      He complains of peripheral edema, but denies headache, chest pain, palpitations, dyspnea with exertion, orthopnea, PND, visual symptoms, neurologic problems, syncope, and side effects from treatment.  He notes no problems with any antihypertensive medication side effects.  Further comments include: BP @ home "little high". Occa edema feet.         Positive major cardiovascular risk factors include male age 50 years old or older, diabetes, hyperlipidemia, and hypertension.  Negative major cardiovascular risk factors include negative family history for ischemic heart disease and non-tobacco-user status.        Further assessment for target organ damage reveals no history of ASHD, stroke/TIA, or peripheral vascular disease.    Lipid Management History:      Positive NCEP/ATP III risk factors include male age 64 years old or older, diabetes, and hypertension.  Negative NCEP/ATP III risk factors include no family history for ischemic heart disease, non-tobacco-user status, no ASHD (atherosclerotic heart disease), no prior stroke/TIA, no peripheral vascular disease, and no history of aortic aneurysm.        Current Allergies (reviewed today): No known allergies   Past Medical History:    Reviewed history from 05/09/2007 and no changes required:       Hypertension       Diabetes mellitus, type II       Hyperlipidemia       abnormal EKG       erectile dysfunction       dyslipidemia       family history diabetes first degree relative  Past Surgical History:    Reviewed history from 05/09/2007 and no changes required:       Colonoscopy X 2   Family History:    Reviewed history from 05/09/2007 and no changes  required:       Family History Diabetes 1st degree relative (F)       Family History MI (M 2); F prostate CA  Social History:    Reviewed history from 05/09/2007 and no changes required:       NO DIET     Physical Exam  General:     Well-developed,well-nourished,in no acute distress; alert,appropriate and cooperative throughout examination;overweight-appearing.   Neck:     No deformities, masses, or tenderness noted. Lungs:     Normal respiratory effort, chest expands symmetrically. Lungs are clear to auscultation, no crackles or wheezes. Heart:     Normal rate and regular rhythm. S1 and S2 normal without  gallop, murmur, click, rub. S4 Abdomen:     Bowel sounds positive,abdomen soft and non-tender without masses, organomegaly or hernias noted.Protuberant Pulses:     R and L carotid,radial,dorsalis pedis and posterior tibial pulses are full and equal bilaterally Extremities:     trace left pedal edema and trace right pedal edema.   Skin:     Intact without suspicious lesions or rashes Psych:     Oriented X3, memory intact for recent and remote, normally interactive, good eye contact, not anxious appearing, and not depressed appearing.  Some compliance (diet ) issues    Impression & Recommendations:  Problem # 1:  DIABETES MELLITUS, TYPE II, UNCONTROLLED (ICD-250.02)  The following medications were removed from the medication list:    Micardis 80 Mg Tabs (Telmisartan) .[name]... 1 by mouth once daily -  His updated medication list for this problem includes:    Benazepril Hcl 40 Mg Tabs (Benazepril hcl) .[name]... 1 by mouth bid   Problem # 2:  HYPERLIPIDEMIA (ICD-272.2)  His updated medication list for this problem includes:    Pravastatin Sodium 40 Mg Tabs (Pravastatin sodium) .[name]... 1 qhs   Problem # 3:  HYPERTENSION, ESSENTIAL NOS (ICD-401.9)  The following medications were removed from the medication list:    Micardis 80 Mg Tabs (Telmisartan) .[name]... 1 by mouth once daily -  His updated medication list for this problem includes:    Verapamil Hcl Cr 240 Mg Tbcr (Verapamil hcl) .[name]... 1/2 by mouth bid    Metoprolol Tartrate 100 Mg Tabs (Metoprolol tartrate) .[name]... Take 1 tablet 2 times a day    Benazepril Hcl 40 Mg Tabs (Benazepril hcl) .[name]... 1 by mouth bid   Problem # 4:  ERECTILE DYSFUNCTION (ICD-302.72)  His updated medication list for this problem includes:    Viagra 100 Mg Tabs (Sildenafil citrate) ..... Use as directed   Complete Medication List: 1)  Verapamil Hcl Cr 240 Mg Tbcr (Verapamil hcl) .... 1/2 by mouth bid 2)  Actoplus Met 15-850mg  Tabs (pioglitazone Hcl-metformin  Hcl)  .... Take 1 tablet bid with 2  largest meals 3)  Freestyle Test Strp (Glucose blood) .... Check as directed 4)  Metoprolol Tartrate 100 Mg Tabs (Metoprolol tartrate) .... Take 1 tablet 2 times a day 5)  Viagra 100 Mg Tabs (Sildenafil citrate) .... Use as directed 6)  Benazepril Hcl 40 Mg Tabs (Benazepril hcl) .[name].. 1 by mouth bid 7)  Pravastatin Sodium 40 Mg Tabs (Pravastatin sodium) .[name].. 1 qhs 8)  Freestyle Lancets Misc (Lancets) .... Use as directed  Hypertension Assessment/Plan:      The patient's hypertensive risk group is category C: Target organ damage and/or diabetes.  Today's blood pressure is 140/90.    Lipid Assessment/Plan:      Based on NCEP/ATP III, the patient's risk  factor category is "history of diabetes".  From this information, the patient's calculated lipid goals are as follows: Total cholesterol goal is 200; LDL cholesterol goal is 100; HDL cholesterol goal is 40; Triglyceride goal is 150.  His LDL cholesterol goal has not been met.  Secondary causes for hyperlipidemia have been ruled out.  He has been counseled on adjunctive measures for lowering his cholesterol and has been provided with dietary instructions.     Patient Instructions: 1)  Please schedule a follow-up appointment in 6 months. 2)  BUN,creat,K+ prior to visit, ICD-9:401.9 3)  Hepatic Panel prior to visit, ICD-9:995.20 4)  Lipid Panel prior to visit, ICD-9:272.4 5)  HbgA1C prior to visit, ICD-9:250.02. Low carb diet of choice , NOT Atkin's. 6)  See your eye doctor yearly to check for diabetic eye damage. 7)  Check your Blood Pressure regularly. If it is above: 130/80 on average you should make an appointment.    Prescriptions: FREESTYLE LANCETS   MISC (LANCETS) use as directed  #100 x 3   Entered and Authorized by:   Marga Melnick MD   Signed by:   Marga Melnick MD on 08/14/2007   Method used:   Print then Give to Patient   RxID:   [id number] PRAVASTATIN SODIUM 40 MG  TABS (PRAVASTATIN  SODIUM) 1 qhs  #90 x 2   Entered and Authorized by:   Marga Melnick MD   Signed by:   Marga Melnick MD on 08/14/2007   Method used:   Print then Give to Patient   RxID:   [id number] METOPROLOL TARTRATE 100 MG  TABS (METOPROLOL TARTRATE) take 1 tablet 2 times a day  #180 x 1   Entered and Authorized by:   Marga Melnick MD   Signed by:   Marga Melnick MD on 08/14/2007   Method used:   Print then Give to Patient   RxID:   [id number] FREESTYLE TEST   STRP (GLUCOSE BLOOD) check as directed  #100 x 3   Entered and Authorized by:   Marga Melnick MD   Signed by:   Marga Melnick MD on 08/14/2007   Method used:   Print then Give to Patient   RxID:   [id number] ACTOPLUS MET 15-850MG  TABS (PIOGLITAZONE HCL-METFORMIN HCL) take 1 tablet bid with 2  largest meals  #60 x 5   Entered and Authorized by:   Marga Melnick MD   Signed by:   Marga Melnick MD on 08/14/2007   Method used:   Print then Give to Patient   RxID:   [id number] VERAPAMIL HCL CR 240 MG TBCR (VERAPAMIL HCL) 1/2 by mouth bid  #90 x 1   Entered and Authorized by:   Marga Melnick MD   Signed by:   Marga Melnick MD on 08/14/2007   Method used:   Print then Give to Patient   RxID:   [id number] BENAZEPRIL HCL 40 MG  TABS (BENAZEPRIL HCL) 1 by mouth bid  #180 x 1   Entered and Authorized by:   Marga Melnick MD   Signed by:   Marga Melnick MD on 08/14/2007   Method used:   Print then Give to Patient   RxID:   [id number]  ]

## 2010-08-03 NOTE — Progress Notes (Signed)
Summary: lab appt [phone] ov [phone] not due for cpx [id number]  ---- Converted from flag ---- ---- 02/23/2010 11:43 AM, Arbie [place] wrote: PATIENT RETURNED CALL LAB SCHEDULED [id number] - OV [id number]  ---- 02/22/2010 10:02 AM, Arbie [place] wrote: lmom to schedule fasting lab appt & follow up ov   ---- 02/22/2010 6:55 AM, Unice Cobble MD wrote: fasting labs needed; last done 12/2008: Lipids, hepatic panel, BUN,creat, K+, A1c , urine micro albumin, TSH( 250.00, 401.9, 995.20). Appt 3-5 days after labs . Thanks, Hopp ------------------------------

## 2010-08-03 NOTE — Assessment & Plan Note (Signed)
Summary: pnuemonia??//lch   Vital Signs:  Patient profile:   65 year old male Weight:      237.4 pounds Temp:     98.2 degrees F oral Pulse rate:   60 / minute Resp:     17 per minute BP sitting:   148 / 82  (left arm) Cuff size:   large  Vitals Entered By: Georgette Dover (Nov 15, 2009 2:09 PM) CC: ?? Pneumonia: Chest cold and pain in rib area (right side) Comments REVIEWED MED LIST, PATIENT AGREED DOSE AND INSTRUCTION CORRECT    CC:  ?? Pneumonia: Chest cold and pain in rib area (right side).  History of Present Illness: Residual chest congestion with minor NP cough except in shower.Pain R inferior thorax with cough or sneeze. Rx: Mucinex . S/P Zithromax .He is on ACE-I.  Allergies (verified): No Known Drug Allergies  Review of Systems General:  Denies chills, fever, and sweats. ENT:  Denies nasal congestion and sinus pressure; No frontal headache , facial pain or purulence. Resp:  Complains of pleuritic; denies chest pain with inspiration and coughing up blood; No purulence.  Physical Exam  General:  in no acute distress; alert,appropriate and cooperative throughout examination Chest Wall:  Minimal tenderness R inf thorax Lungs:  Normal respiratory effort, chest expands symmetrically. Lungs are clear to auscultation, no  rub,crackles or wheezes. Heart:  regular rhythm, no murmur, and bradycardia.     Impression & Recommendations:  Problem # 1:  COUGH (ICD-786.2) on ACE-I  Complete Medication List: 1)  Verapamil Hcl Cr 240 Mg Tbcr (Verapamil hcl) .... 1/2 by mouth bid 2)  Actoplus Met 15-850mg  Tabs (pioglitazone Hcl-metformin Hcl)  .... Take 1 tablet bid with 2  largest meals 3)  Freestyle Test Strp (Glucose blood) .... Check as directed 4)  Metoprolol Tartrate 100 Mg Tabs (Metoprolol tartrate) .... Take 1 tablet 2 times a day 5)  Viagra 100 Mg Tabs (Sildenafil citrate) .... Use as directed - do not take with nitroglycerin 6)  Pravastatin Sodium 40 Mg Tabs  (Pravastatin sodium) .[name].. 1 qhs 7)  Allopurinol 300 Mg Tabs (Allopurinol) .... 1/2  daily as gout prevention(do not take if gout active) 8)  Glimepiride 2 Mg Tabs (Glimepiride) .[name].. 1 q am 9)  Spironolactone 25 Mg Tabs (Spironolactone) .[name].. 1 once daily if bp averages > 130/85 10)  Clonidine Hcl 0.1 Mg Tabs (Clonidine hcl) .[name].. 1 two times a day with metoprolol 11)  Benzonatate 200 Mg Caps (Benzonatate) .[name].. 1 every 6-8 hrs as needed cough 12)  Onetouch Ultra Test Strp (Glucose blood) .... Test as directed 13)  Losartan Potassium 100 Mg Tabs (Losartan potassium) .[name].. 1 once daily  Patient Instructions: 1)  Drink as much fluid as you can tolerate for the next few days. Stop Benazepril. QVAR 40  as sample 1-2 puffs every 12hrs for cough. Gargle & spit after use. Prescriptions: LOSARTAN POTASSIUM 100 MG TABS (LOSARTAN POTASSIUM) 1 once daily  #30 x 5   Entered and Authorized by:   Unice Cobble MD   Signed by:   Unice Cobble MD on 11/15/2009   Method used:   Print then Give to Patient   RxID:   [id number] SPIRONOLACTONE 25 MG TABS (SPIRONOLACTONE) 1 once daily IF BP AVERAGES > 130/85  #90 Each x 3   Entered by:   Georgette Dover   Authorized by:   Unice Cobble MD   Signed by:   Georgette Dover on 11/15/2009   Method used:   Electronically  to        [facility] (retail)       [phone]       [place], [place]  [address]       Ph: [phone]       Fax: [phone]   RxID:   [id number]

## 2010-08-03 NOTE — Assessment & Plan Note (Signed)
Summary: MED REFILL/NS/KDC   Vital Signs:  Patient profile:   65 year old male Height:      67.5 inches Weight:      241.6 pounds BMI:     37.42 Temp:     98.0 degrees F oral Pulse rate:   60 / minute Resp:     16 per minute BP sitting:   126 / 84  (left arm) Cuff size:   large  Vitals Entered By: Shonna Chock (April 06, 2009 1:23 PM) CC: CPX: EKG DONE IN 08/2008, DISCUSS THE POSSIBILITY OF COMING OFF SOME BLOODSUGAR MEDS Comments REVIEWED MED LIST, PATIENT AGREED DOSE AND INSTRUCTION CORRECT    CC:  CPX: EKG DONE IN 08/2008 and DISCUSS THE POSSIBILITY OF COMING OFF SOME BLOODSUGAR MEDS.  History of Present Illness: Serial A1c s reviewed ; it has varied from 6.2 to 6.7, last 6.6 %. His high FBS 116; 2 hr post meal < 134.  Lowest glucose 62 @ 5:56  pm w/o symptoms. Weight stable. BP < 140/88; no average.  Preventive Screening-Counseling & Management  Alcohol-Tobacco     Smoking Status: quit  Caffeine-Diet-Exercise     Does Patient Exercise: no  Allergies (verified): No Known Drug Allergies  Past History:  Past Medical History: Hypertension Diabetes mellitus, type II Hyperlipidemia abnormal EKG erectile dysfunction Gout  Past Surgical History: Colonoscopy X 2 : negative   Family History: Father: DM, prostate CA in 22s Mother: MI @ 20 Siblings: bro CA in stomach  Social History: No specific diet [place]) Alcohol use-yes Regular exercise-no  Review of Systems General:  Denies fatigue, sleep disorder, and weight loss. Eyes:  Denies blurring, double vision, and vision loss-both eyes; Last exam 2009; no retinopathy. CV:  Denies chest pain or discomfort, leg cramps with exertion, lightheadness, near fainting, palpitations, shortness of breath with exertion, swelling of feet, and swelling of hands. Resp:  Denies excessive snoring, hypersomnolence, and morning headaches. GI:  Denies abdominal pain, bloody stools, dark tarry stools, and  indigestion; PMH IBS. MS:  Denies joint pain, joint redness, and joint swelling; No rcent gout. Derm:  Denies poor wound healing. Neuro:  Denies numbness and tingling; No burning in feet. Endo:  Denies excessive hunger, excessive thirst, and excessive urination.  Physical Exam  General:  well-nourished,in no acute distress; alert,appropriate and cooperative throughout examination Neck:  No deformities, masses, or tenderness noted. Lungs:  Normal respiratory effort, chest expands symmetrically. Lungs are clear to auscultation, no crackles or wheezes. Heart:  regular rhythm, no gallop, no rub, no JVD, no HJR, bradycardia, and grade 1 /6 systolic murmur.   Abdomen:  Bowel sounds positive,abdomen soft and non-tender without masses, organomegaly or hernias noted. Prostate:  declined ("next time") Pulses:  R and L carotid,radial,dorsalis pedis and posterior tibial pulses are full and equal bilaterally Extremities:  1/2+ left pedal edema and 1/2+ right pedal edema.   Neurologic:  alert & oriented X3 and DTRs symmetrical and normal.   Skin:  Intact without suspicious lesions or rashes Psych:  memory intact for recent and remote, normally interactive, and good eye contact.     Impression & Recommendations:  Problem # 1:  DIABETES MELLITUS, TYPE II, WITHOUT COMPLICATIONS (ICD-250.00)  His updated medication list for this problem includes:    Benazepril Hcl 40 Mg Tabs (Benazepril hcl) .[name]... 1 by mouth bid    Glimepiride 2 Mg Tabs (Glimepiride) .[name]... 1 q am  Orders: Venipuncture (16109) TLB-A1C / Hgb A1C (Glycohemoglobin) (83036-A1C) TLB-Microalbumin/Creat Ratio, Urine (  82043-MALB)  Problem # 2:  HYPERLIPIDEMIA (ICD-272.2) Essentially controlled His updated medication list for this problem includes:    Pravastatin Sodium 40 Mg Tabs (Pravastatin sodium) .[name]... 1 qhs  Problem # 3:  GOUT (ICD-274.9)  Quiescent His updated medication list for this problem includes:    Allopurinol 300 Mg  Tabs (Allopurinol) .[name]... 1 once daily as gout prevention(do not take if gout active)  Orders: Venipuncture (16109) TLB-Uric Acid, Blood (84550-URIC)  Problem # 4:  HYPERTENSION (ICD-401.9)  His updated medication list for this problem includes:    Verapamil Hcl Cr 240 Mg Tbcr (Verapamil hcl) .[name]... 1/2 by mouth bid    Metoprolol Tartrate 100 Mg Tabs (Metoprolol tartrate) .[name]... Take 1 tablet 2 times a day    Benazepril Hcl 40 Mg Tabs (Benazepril hcl) .[name]... 1 by mouth bid    Spironolactone 25 Mg Tabs (Spironolactone) .[name]... 1 once daily if bp averages > 130/85    Clonidine Hcl 0.1 Mg Tabs (Clonidine hcl) .[name]... 1 two times a day with metoprolol  Complete Medication List: 1)  Verapamil Hcl Cr 240 Mg Tbcr (Verapamil hcl) .... 1/2 by mouth bid 2)  Actoplus Met 15-850mg  Tabs (pioglitazone Hcl-metformin Hcl)  .... Take 1 tablet bid with 2  largest meals 3)  Freestyle Test Strp (Glucose blood) .... Check as directed 4)  Metoprolol Tartrate 100 Mg Tabs (Metoprolol tartrate) .... Take 1 tablet 2 times a day 5)  Viagra 100 Mg Tabs (Sildenafil citrate) .... Use as directed - do not take with nitroglycerin 6)  Benazepril Hcl 40 Mg Tabs (Benazepril hcl) .[name].. 1 by mouth bid 7)  Pravastatin Sodium 40 Mg Tabs (Pravastatin sodium) .[name].. 1 qhs 8)  Freestyle Lancets Misc (Lancets) .... Use as directed 9)  Allopurinol 300 Mg Tabs (Allopurinol) .[name].. 1 once daily as gout prevention(do not take if gout active) 10)  Glimepiride 2 Mg Tabs (Glimepiride) .[name].. 1 q am 11)  Spironolactone 25 Mg Tabs (Spironolactone) .[name].. 1 once daily if bp averages > 130/85 12)  Clonidine Hcl 0.1 Mg Tabs (Clonidine hcl) .[name].. 1 two times a day with metoprolol  Patient Instructions: 1)  Consume < 40 grams (< 10 tsp) of sugar/ day. Let me know if you want to monitor DRE & PSA as discussed. Follow the 4 "Rules of 40" as discussed. 2)  Please schedule a follow-up appointment in 6 months : 3)  BUN,creat, K+ prior to visit, ICD-9:401.9 4)   Hepatic Panel prior to visit, ICD-9:995.20 5)  Lipid Panel prior to visit, ICD-9:272.4 6)  TSH prior to visit, ICD-9:272.4 7)  HbgA1C prior to visit, ICD-9:250.00; uric acid, 274.9 Prescriptions: PRAVASTATIN SODIUM 40 MG  TABS (PRAVASTATIN SODIUM) 1 qhs  #90 x 1   Entered and Authorized by:   Marga Melnick MD   Signed by:   Marga Melnick MD on 04/06/2009   Method used:   Print then Give to Patient   RxID:   [id number] BENAZEPRIL HCL 40 MG  TABS (BENAZEPRIL HCL) 1 by mouth bid  #180 Each x 1   Entered and Authorized by:   Marga Melnick MD   Signed by:   Marga Melnick MD on 04/06/2009   Method used:   Print then Give to Patient   RxID:   [id number] VIAGRA 100 MG  TABS (SILDENAFIL CITRATE) use as directed - DO NOT TAKE WITH NITROGLYCERIN  #6 x 6   Entered and Authorized by:   Marga Melnick MD   Signed by:   Marga Melnick MD  on 04/06/2009   Method used:   Print then Give to Patient   RxID:   [id number] METOPROLOL TARTRATE 100 MG  TABS (METOPROLOL TARTRATE) take 1 tablet 2 times a day  #180 Each x 2   Entered and Authorized by:   Marga Melnick MD   Signed by:   Marga Melnick MD on 04/06/2009   Method used:   Print then Give to Patient   RxID:   [id number] FREESTYLE TEST   STRP (GLUCOSE BLOOD) check as directed  #100 x 3   Entered and Authorized by:   Marga Melnick MD   Signed by:   Marga Melnick MD on 04/06/2009   Method used:   Print then Give to Patient   RxID:   [id number] ACTOPLUS MET 15-850MG  TABS (PIOGLITAZONE HCL-METFORMIN HCL) take 1 tablet bid with 2  largest meals  #180 x 1   Entered and Authorized by:   Marga Melnick MD   Signed by:   Marga Melnick MD on 04/06/2009   Method used:   Print then Give to Patient   RxID:   [id number] VERAPAMIL HCL CR 240 MG TBCR (VERAPAMIL HCL) 1/2 by mouth bid  #90 x 3   Entered and Authorized by:   Marga Melnick MD   Signed by:   Marga Melnick MD on 04/06/2009   Method used:   Print then Give  to Patient   RxID:   [id number]

## 2010-10-30 ENCOUNTER — Other Ambulatory Visit: Payer: Self-pay | Admitting: Internal Medicine

## 2010-10-31 NOTE — Telephone Encounter (Signed)
Uric Acid 274.9

## 2010-11-11 ENCOUNTER — Other Ambulatory Visit: Payer: Self-pay | Admitting: Internal Medicine

## 2010-11-11 NOTE — Telephone Encounter (Signed)
Uric Acid 274.9

## 2010-11-15 NOTE — Assessment & Plan Note (Signed)
[facility] OFFICE NOTE   DANNELL, LETSCH                      MRN:          [id number]  DATE:05/16/2007                            DOB:          [birth year]    Mr. Child is a delightful 65 year old patient referred by Dr. Linna Darner.  He  needed a heart evaluation.  He has multiple coronary risk factors, a  heart murmur, and there was question of whether or not Viagra was safe  to use.   The patient has been doing fairly well.  He has gained a bit of weight  over the last year or two.  He is a new-onset diabetic.   He has long-standing hypertension, hypercholesterolemia.   His mother had premature coronary artery disease.  The patient has had  some exertional dyspnea.  It sounds functional.  He has been trying to  get into a walking program.  In the last few weeks he says he has lost 3  pounds doing it.  He seems motivated to be a little more active.   He has never had documented coronary artery disease.  There has been no  chest pain, palpitations, PND, or orthopnea.   He has been compliant with his blood pressure pills.  The patient was  concerned, he has been using Viagra for about 2 years.  He knows he has  multiple coronary risk factors and was wondering if it was safe.  We had  a brief discussion about this, and I told him that so long as his stress  test is normal, and he is not having chest pains and not on other  vasodilators, I thought it would be safe for continued use.   In talking to the patient, he also says he has had a long-standing  murmur.  He had an echo 6 to 8 years ago someplace in [place].  He  said it was nothing to worry about.   In regards to this he has never had rheumatic heart disease, he has  never had subacute bacterial endocarditis.  There has been no syncope.   The patient's review of systems, otherwise, negative.   His past medical history is remarkable for  hypertension, new-onset  diabetes, hypercholesterolemia.  He has not had previous surgery.   He is happily married.  He has 1 son named Minna Merritts who is actually an  OB/GYN physician in the [place], [place] area.  He works as a Insurance underwriter.  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   He has no known allergies.   His mother died of a heart attack at age 36, father died of natural  causes at age 2 and was a diabetic.   His current medications include:  1. Metoprolol 100 b.i.d.  2. Verapamil 240 b.i.d.  3. Actoplus 15/500 a day.  4. Benazepril 40 a day.  5. Micardis 80 a day.  6. An aspirin a day.   His exam is remarkable for a healthy-appearing, overweight, middle-aged  black male in no distress.  Affect is appropriate.  Weight is 226, blood  pressure is 158/90, pulse is 52 and regular.  Respiratory rate 14.  Afebrile.  HEENT:  Unremarkable.  Carotids normal without bruit.  No lymphadenopathy, thyromegaly, or JVP  elevation.  LUNGS:  Clear with good diaphragmatic motion, no wheezing.  There is an S1, S2, there is a soft, short, systolic murmur that sounds  benign.  PMI is normal.  ABDOMEN:  Benign, bowel sounds positive, no hepatosplenomegaly,  hepatojugular reflux, no triple A.  Small umbilical hernia.  Distal pulses are intact with trace edema.  NEURO:  Nonfocal.  SKIN:  Warm and dry.   Baseline EKG shows sinus rhythm with a voltage criteria for left  ventricular hypertrophy.   IMPRESSION:  1. Hypertension:  Currently on multiple drug therapy including 2 renin      blockers.  I think the patient would probably benefit from the      addition of a diuretic.  I will leave this up to Dr. Linna Darner.  This      can be added as benazepril hydrochlorothiazide, or Micardis      hydrochlorothiazide.  I think that given his current regimen and      trace lower extremity edema, as well as the use of Actos for his      oral hyperglycemic, that he would benefit from this.  We  will see      what his systolic blood pressure gets up to with exercise, but I      suspect it will easily go over 200.  2. Diabetes:  Follow up with Dr. Linna Darner, hemoglobin A1c quarterly.      Baseline retinal check for ophthalmologist.  3. Murmur sounds benign, followup echo.  It has been 6 or 8 years      since he has had one and I think it would be reasonable to check      and also to assess the degree of left ventricular hypertrophy.  4. Exertional dyspnea, sounds functional, continue exercise program.      Given his multiple risk factors, including diabetes, he should have      a followup stress Myoview.  At that point we will also be able to      see what his blood pressure does with exercise.  His last Myoview      is read by myself in 2001 and was normal.  This will also help me      decide whether or not it is safe for him to use Viagra.  5. Erectile dysfunction:  Follow up with Dr. Linna Darner.  I do not know      that he has seen a urologist before.  There is no evidence of      severe vascular disease by exam.  6. Stroke risk prophylaxis:  Continue baby aspirin a day.  7. Overall, Irfan is probably going to do well.  He will follow up      with Dr. Linna Darner for      continued risk factor modification.  I will see him on an as-needed      basis, so long as his stress test and echo are low risk.     Wallis Bamberg. Johnsie Cancel, MD, [facility]  Electronically Signed    PCN/MedQ  DD: 05/16/2007  DT: 05/16/2007  Job #: [id number]   cc:   Darrick Penna. Linna Darner, MD,FACP,FCCP

## 2010-11-18 NOTE — Assessment & Plan Note (Signed)
[facility] OFFICE NOTE   JAKYRIN, BEMAN                      MRN:          [id number]  DATE:09/07/2006                            DOB:          [birth year]    [name] is a 66 year old, African-American gentleman seen  September 07, 2006 to followup blood pressure. Blood pressure at home has  been as high as 160/100. There is some question of whether his blood  pressure cuff may be too small for valid measurements. He has been  encouraged to obtain a cuff that would cover the biceps and not purchase  a finger or wrist cuff. He denies any epistaxis, chest pain,  palpitations or shortness of breath.   He states that he is restricting salt and is on a diet consisting of  increased salads.   Fasting blood sugars have been approximately 120, 2-hour post prandial  sugars in the 130+ range. He denies any skin lesions or paresthesias.   He has never been hospitalized.   He is presently on verapamil ER 240, 1/2-twice a day, benazepril 40 mg  daily, metoprolol 100 mg twice a day as well as Actos/met 15/500 daily.  He is also on allopurinol 100 mg daily.   He has no known drug allergies. He quit smoking in 1972. He drinks  socially.   Weight is up 8 pounds to 231, pulse is 52 and regular, respiratory rate  16, blood pressure 140/96. He has  arteriolar narrowing. Chest is clear  to auscultation. He has an S4 gallop with a grade 1/2 systolic murmur.  All pulses are intact. There are no significant skin lesions. He does  have significant edema to mid calf.   The abdomen is protuberant without organomegaly or masses.   His most recent lab studies revealed a creatinine of 1.4 with an A1c of  6 in August 2007. His NMR profile suggested his LDL goal would be less  than 110. When this was recently checked, it was 164.   His present blood pressure is 140/96 would be associated with 6 times  the risk of heart  attack or stroke. Safe blood pressure is going to be  an average of  less than 130/80.   Because of his edema, amlodipine does not appear to be an option.  Because of the gout, HCTZ should be avoided. He is tolerating the beta  blocker and the calcium channel blocker, verapamil. Consideration could  be given to changing the metoprolol to carvedilol. At this time, an ARB  will be added.   BUN, creatinine, potassium, and A1c will be checked today. Because of  the elevated LDL, it will be recommended that he take Pravastatin 40 mg  qhs and repeat fasting lipids after ten weeks.     Darrick Penna. Linna Darner, MD,FACP,FCCP  Electronically Signed    [facility]/MedQ  DD: 09/07/2006  DT: 09/07/2006  Job #: [id number]

## 2010-12-02 ENCOUNTER — Other Ambulatory Visit: Payer: Self-pay | Admitting: Internal Medicine

## 2010-12-02 MED ORDER — GLIMEPIRIDE 2 MG PO TABS: ORAL_TABLET | ORAL | Status: DC

## 2010-12-02 MED ORDER — ALLOPURINOL 300 MG PO TABS: 300.0000 mg | ORAL_TABLET | Freq: Every day | ORAL | Status: DC

## 2010-12-02 NOTE — Telephone Encounter (Signed)
Due for A1c and Uric Acid Level 274.9, please contact patient to schedule

## 2010-12-12 ENCOUNTER — Other Ambulatory Visit: Payer: Self-pay | Admitting: Internal Medicine

## 2010-12-14 NOTE — Telephone Encounter (Signed)
L MOM to call and schedule labwork 

## 2010-12-15 NOTE — Telephone Encounter (Signed)
RX's to be filled once labs back

## 2010-12-15 NOTE — Telephone Encounter (Signed)
Patient has lab appt for Monday 6/18 at 8:00am

## 2010-12-16 ENCOUNTER — Other Ambulatory Visit: Payer: Self-pay | Admitting: Internal Medicine

## 2010-12-16 DIAGNOSIS — M109 Gout, unspecified: Secondary | ICD-10-CM

## 2010-12-19 ENCOUNTER — Other Ambulatory Visit (INDEPENDENT_AMBULATORY_CARE_PROVIDER_SITE_OTHER): Payer: BC Managed Care – PPO

## 2010-12-19 DIAGNOSIS — M109 Gout, unspecified: Secondary | ICD-10-CM

## 2010-12-19 LAB — HEMOGLOBIN A1C: Hgb A1c MFr Bld: 6.6 % — ABNORMAL HIGH (ref 4.6–6.5)

## 2010-12-19 LAB — URIC ACID: Uric Acid, Serum: 5 mg/dL (ref 4.0–7.8)

## 2010-12-19 NOTE — Progress Notes (Signed)
Labs only

## 2010-12-26 ENCOUNTER — Encounter: Payer: Self-pay | Admitting: Internal Medicine

## 2010-12-27 ENCOUNTER — Encounter: Payer: Self-pay | Admitting: Internal Medicine

## 2010-12-27 ENCOUNTER — Ambulatory Visit (INDEPENDENT_AMBULATORY_CARE_PROVIDER_SITE_OTHER): Payer: BC Managed Care – PPO | Admitting: Internal Medicine

## 2010-12-27 DIAGNOSIS — E119 Type 2 diabetes mellitus without complications: Secondary | ICD-10-CM

## 2010-12-27 DIAGNOSIS — I10 Essential (primary) hypertension: Secondary | ICD-10-CM

## 2010-12-27 DIAGNOSIS — M109 Gout, unspecified: Secondary | ICD-10-CM

## 2010-12-27 MED ORDER — PRAVASTATIN SODIUM 40 MG PO TABS: 40.0000 mg | ORAL_TABLET | Freq: Every day | ORAL | Status: DC

## 2010-12-27 MED ORDER — CLONIDINE HCL 0.1 MG PO TABS: 0.1000 mg | ORAL_TABLET | Freq: Two times a day (BID) | ORAL | Status: DC

## 2010-12-27 MED ORDER — SPIRONOLACTONE 25 MG PO TABS: 25.0000 mg | ORAL_TABLET | Freq: Every day | ORAL | Status: DC

## 2010-12-27 NOTE — Patient Instructions (Signed)
Exercise at least 30-45 minutes a day,  3-4 days a week.  Eat a low-fat diet with lots of fruits and vegetables, up to 7-9 servings per day. Avoid obesity; your goal is waist measurement < 40 inches.Consume less than 40 grams of sugar per day from foods & drinks with High Fructose Corn Sugar as #2,3 or # 4 on label. Diabetes Monitor   The A1c test is used primarily to monitor the glucose control of diabetics over time. The goal of those with diabetes is to keep their blood glucose levels as close to normal as possible. This helps to minimize the complications caused by chronically elevated glucose levels, such as progressive damage to body organs like the kidneys, eyes, cardiovascular system, and nerves. The A1c test gives a picture of the average amount of glucose in the blood over the last few months. It can help a patient and his doctor know if the measures they are taking to control the patient's diabetes are successful or need to be adjusted.  NORMAL VALUES  Non diabetic adults: 5 %-6.1%  Good diabetic control: 6.2-6.4 %  Fair diabetic control: 6.5-7%  Poor diabetic control: greater than 7 % ( except with additional factors such as  advanced age; significant coronary or neurologic disease,etc). Check the A1c every 6 months if it is < 6.5%; every 4 months if  6.5% or higher. Goals for home glucose monitoring are : fasting  or morning glucose goal of  90-150. Two hours after any meal , goal = < 180, preferably < 150. Please  schedule fasting Labs : BMET,Lipids, hepatic panel,  TSH, A1c, urine microalbumin in 5 months ( 250.00, 272.4,995.2)        .

## 2010-12-27 NOTE — Progress Notes (Signed)
Subjective:    Patient ID: Noah Barnes, male    DOB: March 19, 1946, 65 y.o.   MRN: [id number]  HPI Diabetes status assessment: Fasting or morning glucose range:  ? or average :  100 (112 this am)  . Highest glucose 2 hours after any meal:  < ?100 . Hypoglycemia :  no .                                                     Excess thirst :no;  Excess hunger:  no ;  Excess urination:  no.                                  Lightheadedness with standing:  no. Chest pain:  no ; Palpitations :no ;  Pain in  calves with walking:  no .                                                                                                                                Non healing skin  ulcers or sores,especially over the feet:  no. Numbness or tingling or burning in feet : no .                                                                                                                                              Significant change in  Weight : 18# loss with smaller portions. Vision changes : no  .                                                                    Exercise : minimal . Nutrition/diet:  No plan. Medication compliance : yes. Medication adverse  Effects:  no . Eye exam : Summer 2011. Foot care : no.  A1c/ urine microalbumin monitor:  A1c 6.6% (range6.2-6.9 over past 2 yrs)  Review of Systems     Objective:   Physical Exam Gen.: Healthy and well-nourished in appearance. Alert, appropriate and cooperative throughout exam. Lungs: Normal respiratory effort; chest expands symmetrically. Lungs are clear to auscultation without rales, wheezes, or increased work of breathing. Heart: Normal rate and rhythm. Normal S1 and S2. No gallop, click, or rub. S4 , no murmur. No clubbing, cyanosis,  or deformity noted.  Ankle lipedema.Nail health  good. Vascular: Carotid, radial artery, dorsalis pedis and posterior tibial pulses are full and equal. No bruits present. Neurologic: Alert and oriented x3.   Skin: Intact without suspicious lesions or rashes. Psych: Mood and affect are normal. Normally interactive                                                                                    Assessment & Plan:  #1 DM , controlled Plan: no change ; 6 mos

## 2010-12-29 ENCOUNTER — Other Ambulatory Visit: Payer: Self-pay | Admitting: Internal Medicine

## 2011-01-09 ENCOUNTER — Encounter: Payer: Self-pay | Admitting: Internal Medicine

## 2011-01-30 ENCOUNTER — Other Ambulatory Visit: Payer: Self-pay | Admitting: Internal Medicine

## 2011-03-08 ENCOUNTER — Other Ambulatory Visit: Payer: Self-pay | Admitting: Internal Medicine

## 2011-03-08 MED ORDER — MONTELUKAST SODIUM 10 MG PO TABS: 10.0000 mg | ORAL_TABLET | Freq: Every day | ORAL | Status: DC

## 2011-03-08 NOTE — Telephone Encounter (Signed)
RX sent

## 2011-03-14 ENCOUNTER — Telehealth: Payer: Self-pay | Admitting: *Deleted

## 2011-03-14 NOTE — Telephone Encounter (Signed)
PA completed and faxed back awaiting response.

## 2011-03-14 NOTE — Telephone Encounter (Signed)
Patient called back, patient states he has not tried anything OTC

## 2011-03-14 NOTE — Telephone Encounter (Signed)
Left message to call office to verify if Pt has tried any OTC meds beside singular. (zyrtec,claritin, allegra or cetirizine)

## 2011-03-16 NOTE — Telephone Encounter (Signed)
Prior auth denied coverage is only provided in situation where patient has previously received over the counter loratadine, alavert, allegra, or zyrtec or Pt has been unable to tolerate one of these agents.

## 2011-03-17 NOTE — Telephone Encounter (Signed)
Left message to call office

## 2011-03-17 NOTE — Telephone Encounter (Signed)
Explain his insurance's  Decision. He should check cheapest OTC option in this Sunday paper's  drug store fliers/insets.Call if ineffective over 4-6  weeks

## 2011-03-20 NOTE — Telephone Encounter (Signed)
Pt return call Left message to call office

## 2011-03-20 NOTE — Telephone Encounter (Signed)
Discuss with patient  

## 2011-03-31 ENCOUNTER — Other Ambulatory Visit: Payer: Self-pay | Admitting: Internal Medicine

## 2011-06-23 ENCOUNTER — Other Ambulatory Visit: Payer: Self-pay | Admitting: Internal Medicine

## 2011-07-05 ENCOUNTER — Other Ambulatory Visit: Payer: Self-pay | Admitting: Internal Medicine

## 2011-08-03 ENCOUNTER — Other Ambulatory Visit: Payer: Self-pay | Admitting: Internal Medicine

## 2011-08-04 ENCOUNTER — Other Ambulatory Visit: Payer: Self-pay | Admitting: Internal Medicine

## 2011-09-08 ENCOUNTER — Other Ambulatory Visit: Payer: Self-pay | Admitting: Internal Medicine

## 2011-09-08 NOTE — Telephone Encounter (Signed)
Prescription sent to pharmacy.

## 2011-10-06 ENCOUNTER — Other Ambulatory Visit: Payer: Self-pay | Admitting: Internal Medicine

## 2011-10-06 NOTE — Telephone Encounter (Signed)
a1C 250.00

## 2011-12-01 ENCOUNTER — Other Ambulatory Visit: Payer: Self-pay | Admitting: Internal Medicine

## 2011-12-22 ENCOUNTER — Other Ambulatory Visit: Payer: Self-pay | Admitting: Internal Medicine

## 2011-12-22 NOTE — Telephone Encounter (Signed)
PATIENT NEEDS TO SCHEDULE A CPX

## 2011-12-26 ENCOUNTER — Ambulatory Visit (INDEPENDENT_AMBULATORY_CARE_PROVIDER_SITE_OTHER): Payer: BC Managed Care – PPO | Admitting: Internal Medicine

## 2011-12-26 ENCOUNTER — Encounter: Payer: Self-pay | Admitting: Internal Medicine

## 2011-12-26 VITALS — BP 144/80 | HR 52 | Wt 233.2 lb

## 2011-12-26 DIAGNOSIS — M109 Gout, unspecified: Secondary | ICD-10-CM

## 2011-12-26 DIAGNOSIS — E782 Mixed hyperlipidemia: Secondary | ICD-10-CM

## 2011-12-26 DIAGNOSIS — R9431 Abnormal electrocardiogram [ECG] [EKG]: Secondary | ICD-10-CM

## 2011-12-26 DIAGNOSIS — E119 Type 2 diabetes mellitus without complications: Secondary | ICD-10-CM

## 2011-12-26 DIAGNOSIS — Z8042 Family history of malignant neoplasm of prostate: Secondary | ICD-10-CM

## 2011-12-26 DIAGNOSIS — I10 Essential (primary) hypertension: Secondary | ICD-10-CM

## 2011-12-26 LAB — MICROALBUMIN / CREATININE URINE RATIO
Creatinine,U: 184.8 mg/dL
Microalb Creat Ratio: 1.6 mg/g (ref 0.0–30.0)
Microalb, Ur: 2.9 mg/dL — ABNORMAL HIGH (ref 0.0–1.9)

## 2011-12-26 LAB — BASIC METABOLIC PANEL
CO2: 24 mEq/L (ref 19–32)
Calcium: 9.4 mg/dL (ref 8.4–10.5)
Chloride: 108 mEq/L (ref 96–112)
Creatinine, Ser: 1.3 mg/dL (ref 0.4–1.5)
GFR: 72.54 mL/min (ref >60.00)
Glucose, Bld: 150 mg/dL — ABNORMAL HIGH (ref 70–99)
Potassium: 4.4 mEq/L (ref 3.5–5.1)
Sodium: 137 mEq/L (ref 135–145)

## 2011-12-26 LAB — HEPATIC FUNCTION PANEL
ALT: 35 U/L (ref 0–53)
AST: 21 U/L (ref 0–37)
Albumin: 3.9 g/dL (ref 3.5–5.2)
Alkaline Phosphatase: 52 U/L (ref 39–117)
Bilirubin, Direct: 0.1 mg/dL (ref 0.0–0.3)
Total Bilirubin: 0.6 mg/dL (ref 0.3–1.2)
Total Protein: 7.2 g/dL (ref 6.0–8.3)

## 2011-12-26 LAB — LIPID PANEL
Cholesterol: 161 mg/dL (ref 0–200)
HDL: 52.2 mg/dL (ref >39.00)
LDL Cholesterol: 95 mg/dL (ref 0–99)
Total CHOL/HDL Ratio: 3
Triglycerides: 70 mg/dL (ref 0.0–149.0)
VLDL: 14 mg/dL (ref 0.0–40.0)

## 2011-12-26 LAB — HEMOGLOBIN A1C: Hgb A1c MFr Bld: 6.6 % — ABNORMAL HIGH (ref 4.6–6.5)

## 2011-12-26 LAB — TSH: TSH: 1.63 u[IU]/mL (ref 0.35–5.50)

## 2011-12-26 MED ORDER — SILDENAFIL CITRATE 100 MG PO TABS: ORAL_TABLET | ORAL | Status: DC

## 2011-12-26 MED ORDER — SPIRONOLACTONE 25 MG PO TABS: ORAL_TABLET | ORAL | Status: DC

## 2011-12-26 MED ORDER — SITAGLIPTIN PHOS-METFORMIN HCL 50-500 MG PO TABS: ORAL_TABLET | ORAL | Status: DC

## 2011-12-26 MED ORDER — GLIMEPIRIDE 2 MG PO TABS: ORAL_TABLET | ORAL | Status: DC

## 2011-12-26 MED ORDER — CLONIDINE HCL 0.1 MG PO TABS: 0.1000 mg | ORAL_TABLET | Freq: Two times a day (BID) | ORAL | Status: DC

## 2011-12-26 MED ORDER — VERAPAMIL HCL ER 240 MG PO TBCR: EXTENDED_RELEASE_TABLET | ORAL | Status: DC

## 2011-12-26 MED ORDER — ALLOPURINOL 300 MG PO TABS: ORAL_TABLET | ORAL | Status: DC

## 2011-12-26 MED ORDER — LOSARTAN POTASSIUM 100 MG PO TABS: ORAL_TABLET | ORAL | Status: DC

## 2011-12-26 MED ORDER — PRAVASTATIN SODIUM 40 MG PO TABS: 40.0000 mg | ORAL_TABLET | Freq: Every day | ORAL | Status: DC

## 2011-12-26 MED ORDER — CARVEDILOL 12.5 MG PO TABS: ORAL_TABLET | ORAL | Status: DC

## 2011-12-26 NOTE — Assessment & Plan Note (Addendum)
Compared to 09/05/08 there has been progression of the nonspecific T wave changes. There is now inversion of the T wave in leads V3-6. Otherwise there  is essentially no change. Cardiology referral will be completed

## 2011-12-26 NOTE — Progress Notes (Signed)
  Subjective:    Patient ID: Noah Barnes, male    DOB: 09-Nov-1945, 66 y.o.   MRN: [id number]  HPI  Noah Barnes is here for medication management & refills; he denies acute issues.Past medical history/family history/social history were all reviewed and updated.        Review of Systems HYPERTENSION: Disease Monitoring: Blood pressure range-no specifics  Chest pain, palpitations- no       Dyspnea- no Medications: Compliance- yes  Lightheadedness,Syncope- no    Edema- no  DIABETES: Disease Monitoring:A1c 6.6% in 6/12 Blood Sugar ranges- FBS 103-156 Polyuria/phagia/dipsia- no      Visual problems-no; negative Ophth exam March Podiatry exam: 2011 Medications: Compliance- yes  Hypoglycemic symptoms-no Diet: increased Exercise:walking 2 miles 3 X/ week  HYPERLIPIDEMIA: Disease Monitoring: See symptoms for Hypertension Medications: Compliance- yes Abd pain, bowel changes- IBS with intermittent constipation   Muscle aches- no          Objective:   Physical Exam Gen.:  well-nourished in appearance. Alert, appropriate and cooperative throughout exam. Head: Normocephalic without obvious abnormalities Eyes: No corneal or conjunctival inflammation noted.  Mouth: Oral mucosa and oropharynx reveal no lesions or exudates. Teeth in poor repair with caries. Neck: No deformities, masses, or tenderness noted. Range of motion & Thyroid normal Lungs: Normal respiratory effort; chest expands symmetrically. Lungs are clear to auscultation without rales, wheezes, or increased work of breathing. Heart: Normal rate and rhythm. Normal S1 and S2. No gallop, click, or rub. Grade 1/6 systolic murmur  Abdomen: Bowel sounds normal; abdomen soft and nontender. No masses, organomegaly . Protuberant with umbilical  hernia. Genitalia/ DRE: Genital exams unremarkable.Prostate is normal without enlargement, asymmetry, nodularity, or induration.                                                Musculoskeletal/extremities: No deformity or scoliosis noted of  the thoracic or lumbar spine; but there is some asymmetry of the posterior thoracic musculature suggesting occult scoliosis. . No clubbing, cyanosis, or deformity noted. Range of motion  normal .Tone & strength  normal.Joints normal. Nail health  Good.Trace edema Vascular: Carotid, radial artery, dorsalis pedis and  posterior tibial pulses are full and equal. No bruits present. Neurologic: Alert and oriented x3. Deep tendon reflexes symmetrical and 1/2+.  Light touch normal over feet.          Skin: Intact without suspicious lesions or rashes. Lymph: No cervical, axillary, or inguinal lymphadenopathy present. Psych: Mood and affect are normal. Normally interactive                                                                                         Assessment & Plan:

## 2011-12-26 NOTE — Assessment & Plan Note (Signed)
Lipids were last checked 06/02/2010; they were goal at that time. Fasting lipids today

## 2011-12-26 NOTE — Assessment & Plan Note (Signed)
Blood pressure goals reviewed; he was asked to keep a diary of blood pressures

## 2011-12-26 NOTE — Assessment & Plan Note (Signed)
A1c was 6.6% 12/19/2010; recheck today

## 2011-12-26 NOTE — Patient Instructions (Addendum)
Preventive Health Care: Exercise at least 30-45 minutes a day,  3-4 days a week.  Eat a low-fat diet with lots of fruits and vegetables, up to 7-9 servings per day. Avoid obesity; your goal is waist measurement < 40 inches.Consume less than 40 grams of sugar per day from foods & drinks with High Fructose Corn Sugar as # 1,2,3 or # 4 on label. Caries ( cavities) and plaque on the teeth can lead to significant local and systemic infections with threat to your health.Please have dental care completed as soon as possible.  Eye Doctor - have an eye exam @ least annually.                                                         Blood Pressure Goal  Ideally is an AVERAGE < 135/85. This AVERAGE should be calculated from @ least 5-7 BP readings taken @ different times of day on different days of week. You should not respond to isolated BP readings , but rather the AVERAGE for that week.  Please try to go on My Chart within the next 24 hours to allow me to release the results directly to you.

## 2011-12-26 NOTE — Assessment & Plan Note (Signed)
His prostate is actually small with no abnormalities. Standard of care concerning PSA discussed. PSA declined

## 2011-12-28 ENCOUNTER — Encounter: Payer: Self-pay | Admitting: Internal Medicine

## 2012-01-12 ENCOUNTER — Ambulatory Visit (INDEPENDENT_AMBULATORY_CARE_PROVIDER_SITE_OTHER): Payer: BC Managed Care – PPO | Admitting: Cardiovascular Disease

## 2012-01-12 ENCOUNTER — Encounter: Payer: Self-pay | Admitting: Cardiovascular Disease

## 2012-01-12 VITALS — BP 138/80 | HR 82 | Ht 68.0 in | Wt 235.0 lb

## 2012-01-12 DIAGNOSIS — I10 Essential (primary) hypertension: Secondary | ICD-10-CM

## 2012-01-12 DIAGNOSIS — R9431 Abnormal electrocardiogram [ECG] [EKG]: Secondary | ICD-10-CM

## 2012-01-12 NOTE — Progress Notes (Signed)
HPI:  66 year old gentleman presenting for evaluation of an abnormal EKG.  The patient has no personal history of cardiac disease. He has been treated for several years for essential hypertension, type 2 diabetes, and hyperlipidemia. His recent EKG demonstrated an ST and T wave abnormality concerning for inferolateral ischemia.  The patient exercises regularly. He has been doing so for about 6 weeks now. He walks 2-3 miles several days per week at a brisk pace. He specifically denies chest pain or pressure, dyspnea, or weakness with exertion. He denies palpitations, lightheadedness, syncope, orthopnea, or PND. He does have occasional lower extremity swelling.  The patient has gained weight over the last few years. He has set a goal is 30 pounds over the next several months.  Outpatient Encounter Prescriptions as of 01/12/2012  Medication Sig Dispense Refill  . allopurinol (ZYLOPRIM) 300 MG tablet TAKE ONE TABLET BY MOUTH EVERY DAY  30 tablet  5  . carvedilol (COREG) 12.5 MG tablet TAKE ONE-HALF TABLET BY MOUTH TWICE DAILY AND INCREASE TO ONE TABLET BY MOUTH TWICE A DAY IF BLOOD PRESSURE > 135/85  60 tablet  5  . cloNIDine (CATAPRES) 0.1 MG tablet Take 1 tablet (0.1 mg total) by mouth 2 (two) times daily.  60 tablet  5  . glimepiride (AMARYL) 2 MG tablet TAKE ONE-HALF TABLET BY MOUTH EVERY DAY  30 tablet  5  . glucose blood (ONE TOUCH ULTRA TEST) test strip 1 each by Other route. Use as instructed       . losartan (COZAAR) 100 MG tablet 1 by mouth daily  90 tablet  1  . pravastatin (PRAVACHOL) 40 MG tablet Take 1 tablet (40 mg total) by mouth daily.  90 tablet  1  . sildenafil (VIAGRA) 100 MG tablet take by mouth as directed DO NOT TAKE WITH NITROGLCERIN  6 tablet  5  . sitaGLIPtan-metformin (JANUMET) 50-500 MG per tablet TAKE ONE TABLET BY MOUTH TWICE DAILY WITH 2 LARGEST MEALS  60 tablet  5  . spironolactone (ALDACTONE) 25 MG tablet TAKE ONE TABLET BY MOUTH EVERY DAY  90 tablet  1  .  verapamil (CALAN-SR) 240 MG CR tablet TAKE ONE-HALF TABLET BY MOUTH TWICE DAILY  90 tablet  1    Review of patient's allergies indicates no known allergies.  Past Medical History  Diagnosis Date  . Hypertension   . Diabetes mellitus   . Hyperlipidemia   . Gout   . ED (erectile dysfunction)     Past Surgical History  Procedure Date  . Colonoscopy     negative    History   Social History  . Marital Status: Divorced    Spouse Name: N/A    Number of Children: N/A  . Years of Education: N/A   Occupational History  . Not on file.   Social History Main Topics  . Smoking status: Former Smoker    Quit date: 07/04/1991  . Smokeless tobacco: Never Used  . Alcohol Use: 1.2 oz/week    2 Glasses of wine per week      socially  . Drug Use: Not on file  . Sexually Active: Not on file   Other Topics Concern  . Not on file   Social History Narrative  . No narrative on file    Family History  Problem Relation Age of Onset  . Heart attack Mother   . Cancer Father     prostate cancer  . Diabetes Father   . Cancer Brother  stomach cancer  . Stroke Neg Hx   . Heart disease Neg Hx     ROS: General: no fevers/chills/night sweats Eyes: no blurry vision, diplopia, or amaurosis ENT: no sore throat or hearing loss Resp: no cough, wheezing, or hemoptysis CV: no edema or palpitations GI: no abdominal pain, nausea, vomiting, diarrhea. Positive for constipation  GU: no dysuria, frequency, or hematuria Skin: no rash Neuro: no headache, numbness, tingling, or weakness of extremities Musculoskeletal: no joint pain or swelling Heme: no bleeding, DVT, or easy bruising Endo: no polydipsia or polyuria  BP 138/80  Pulse 82  Ht 5\' 8"  (1.727 m)  Wt 106.595 kg (235 lb)  BMI 35.73 kg/m2  SpO2 99%  PHYSICAL EXAM: Pt is alert and oriented, WD, WN, pleasant overweight male in no distress. HEENT: normal Neck: JVP normal. Carotid upstrokes normal without bruits. No  thyromegaly. Lungs: equal expansion, clear bilaterally CV: Apex is discrete and nondisplaced, RRR without murmur or gallop Abd: soft, NT, +BS, no bruit, no hepatosplenomegaly, obese Back: no CVA tenderness Ext: 1+ pretibial and ankle edema bilaterally        Femoral pulses 2+= without bruits        DP/PT pulses intact and = Skin: warm and dry without rash Neuro: CNII-XII intact             Strength intact = bilaterally  EKG:  Sinus bradycardia with LVH and inferolateral ST and T wave abnormality. Differential diagnosis includes repolarization abnormality versus ischemia  ASSESSMENT AND PLAN: 1. Abnormal EKG. Considering the patient's multiple cardiovascular risk factors which include hypertension, diabetes, and hyperlipidemia, I have recommended that he undergo an exercise Myoview stress test and a 2-D echocardiogram. The stress test will evaluate for ischemic heart disease. The echocardiogram will help evaluate for LVH which is clearly seen on his EKG. Further followup pending the results of those studies.  2. Essential hypertension. The patient is on appropriate treatment with an ARB in the setting of his diabetes. He is on multidrug therapy. We discussed the importance of weight loss as it pertains to treatment of his hypertension and diabetes. His blood pressure appears reasonably well controlled.  3. Hyperlipidemia. The patient is on a statin drug. His lipids are at goal with a total cholesterol of 161, HDL 52, and LDL 95. His lipids are treated by Dr. Linna Darner.  Sherren Mocha 01/12/2012 3:11 PM

## 2012-01-12 NOTE — Patient Instructions (Addendum)
Your physician recommends that you schedule a follow-up appointment as needed  Your physician has requested that you have en exercise stress myoview. For further information please visit [email or web address]. Please follow instruction sheet, as given.  Your physician has requested that you have an echocardiogram. Echocardiography is a painless test that uses sound waves to create images of your heart. It provides your doctor with information about the size and shape of your heart and how well your heart's chambers and valves are working. This procedure takes approximately one hour. There are no restrictions for this procedure.

## 2012-01-17 ENCOUNTER — Ambulatory Visit (HOSPITAL_COMMUNITY): Payer: BC Managed Care – PPO | Attending: Cardiology | Admitting: Radiology

## 2012-01-17 DIAGNOSIS — I517 Cardiomegaly: Secondary | ICD-10-CM | POA: Insufficient documentation

## 2012-01-17 DIAGNOSIS — E119 Type 2 diabetes mellitus without complications: Secondary | ICD-10-CM | POA: Insufficient documentation

## 2012-01-17 DIAGNOSIS — R9431 Abnormal electrocardiogram [ECG] [EKG]: Secondary | ICD-10-CM | POA: Insufficient documentation

## 2012-01-17 DIAGNOSIS — I10 Essential (primary) hypertension: Secondary | ICD-10-CM

## 2012-01-17 DIAGNOSIS — E785 Hyperlipidemia, unspecified: Secondary | ICD-10-CM | POA: Insufficient documentation

## 2012-01-17 NOTE — Progress Notes (Signed)
Echocardiogram performed.  

## 2012-01-22 ENCOUNTER — Ambulatory Visit (HOSPITAL_COMMUNITY): Payer: BC Managed Care – PPO | Attending: Cardiology | Admitting: Radiology

## 2012-01-22 VITALS — BP 150/93 | HR 53 | Ht 68.0 in | Wt 233.0 lb

## 2012-01-22 DIAGNOSIS — R9431 Abnormal electrocardiogram [ECG] [EKG]: Secondary | ICD-10-CM

## 2012-01-22 DIAGNOSIS — E669 Obesity, unspecified: Secondary | ICD-10-CM | POA: Insufficient documentation

## 2012-01-22 DIAGNOSIS — E119 Type 2 diabetes mellitus without complications: Secondary | ICD-10-CM

## 2012-01-22 DIAGNOSIS — Z87891 Personal history of nicotine dependence: Secondary | ICD-10-CM | POA: Insufficient documentation

## 2012-01-22 DIAGNOSIS — E785 Hyperlipidemia, unspecified: Secondary | ICD-10-CM | POA: Insufficient documentation

## 2012-01-22 DIAGNOSIS — Z8249 Family history of ischemic heart disease and other diseases of the circulatory system: Secondary | ICD-10-CM | POA: Insufficient documentation

## 2012-01-22 DIAGNOSIS — R609 Edema, unspecified: Secondary | ICD-10-CM | POA: Insufficient documentation

## 2012-01-22 DIAGNOSIS — I10 Essential (primary) hypertension: Secondary | ICD-10-CM

## 2012-01-22 MED ORDER — TECHNETIUM TC 99M TETROFOSMIN IV KIT
11.0000 | PACK | Freq: Once | INTRAVENOUS | Status: AC | PRN
  Administered 2012-01-22: 11 via INTRAVENOUS

## 2012-01-22 MED ORDER — TECHNETIUM TC 99M TETROFOSMIN IV KIT
33.0000 | PACK | Freq: Once | INTRAVENOUS | Status: AC | PRN
  Administered 2012-01-22: 33 via INTRAVENOUS

## 2012-01-22 NOTE — Progress Notes (Signed)
[address] [place] [address] [phone]  Cardiology Nuclear Med Study  Noah Barnes is a 66 y.o. male     MRN : [id number]     DOB: 12-19-1945  Procedure Date: 01/22/2012  Nuclear Med Background Indication for Stress Test:  Evaluation for Ischemia and Abnormal EKG History:  '08 MPS:No ischemia, EF=53%; 01/17/12 Echo:EF=55-60%, mild LVH Cardiac Risk Factors: Family History - CAD, History of Smoking, Hypertension, Lipids, NIDDM and Obesity  Symptoms:  Lower extremity edema, denies any chest pain.   Nuclear Pre-Procedure Caffeine/Decaff Intake:  None > 12 hrs NPO After: 9:00pm   Lungs:  Clear. IV 0.9% NS with Angio Cath:  22g  IV Site: R Antecubital x 1, tolerated well IV Started by:  Irven Baltimore, RN  Chest Size (in):  46 Cup Size: n/a  Height: 5\' 8"  (1.727 m)  Weight:  233 lb (105.688 kg)  BMI:  Body mass index is 35.43 kg/(m^2). Tech Comments:  Coreg held x 24 hours    Nuclear Med Study 1 or 2 day study: 1 day  Stress Test Type:  Stress  Reading MD: Kirk Ruths, MD  Order Authorizing Provider:  Sherren Mocha, MD  Resting Radionuclide: Technetium 61m Tetrofosmin  Resting Radionuclide Dose: 11.0 mCi   Stress Radionuclide:  Technetium 30m Tetrofosmin  Stress Radionuclide Dose: 33.0 mCi           Stress Protocol Rest HR: 53 Stress HR: 150  Rest BP: 150/93 Stress BP: 214/71  Exercise Time (min): 7:00 METS: 7.1   Predicted Max HR: 155 bpm % Max HR: 96.77 bpm Rate Pressure Product: 32100   Dose of Adenosine (mg):  n/a Dose of Lexiscan: n/a mg  Dose of Atropine (mg): n/a Dose of Dobutamine: n/a mcg/kg/min (at max HR)  Stress Test Technologist: Letta Moynahan, CMA-N  Nuclear Technologist:  Charlton Amor, CNMT     Rest Procedure:  Myocardial perfusion imaging was performed at rest 45 minutes following the intravenous administration of Technetium 66m Tetrofosmin.  Rest ECG: LVH with nonspecific T-wave  changes.  Stress Procedure:  The patient exercised on the treadmill utilizing the Bruce protocol for seven minutes. He then stopped due to fatigue and denied any chest pain.  There were no diagnostic ST-T wave changes, frequent PAC's were noted.  Technetium 70m Tetrofosmin was injected at peak exercise and myocardial perfusion imaging was performed after a brief delay.  Stress ECG: Insignificant upsloping ST segment depression.  QPS Raw Data Images:  Acquisition technically good; normal left ventricular size. Stress Images:  Normal homogeneous uptake in all areas of the myocardium. Rest Images:  Normal homogeneous uptake in all areas of the myocardium. Subtraction (SDS):  No evidence of ischemia. Transient Ischemic Dilatation (Normal <1.22):  1.03 Lung/Heart Ratio (Normal <0.45):  0.23  Quantitative Gated Spect Images QGS EDV:  103 ml QGS ESV:  45 ml  Impression Exercise Capacity:  Fair exercise capacity. BP Response:  Hypertensive blood pressure response. Clinical Symptoms:  There is dyspnea. ECG Impression:  Insignificant upsloping ST segment depression. Comparison with Prior Nuclear Study: No previous nuclear study performed  Overall Impression:  Normal stress nuclear study.  LV Ejection Fraction:57%.  LV Wall Motion:  NL LV Function; NL Wall Motion  Kirk Ruths

## 2012-07-05 ENCOUNTER — Other Ambulatory Visit: Payer: Self-pay | Admitting: Internal Medicine

## 2012-08-02 ENCOUNTER — Other Ambulatory Visit: Payer: Self-pay | Admitting: Internal Medicine

## 2012-08-17 ENCOUNTER — Other Ambulatory Visit: Payer: Self-pay

## 2012-08-27 ENCOUNTER — Other Ambulatory Visit: Payer: Self-pay | Admitting: Internal Medicine

## 2012-10-16 ENCOUNTER — Other Ambulatory Visit: Payer: Self-pay | Admitting: Internal Medicine

## 2012-10-16 NOTE — Telephone Encounter (Signed)
Patient needs to schedule  CPX  

## 2012-10-22 ENCOUNTER — Other Ambulatory Visit: Payer: Self-pay | Admitting: Internal Medicine

## 2012-10-30 ENCOUNTER — Other Ambulatory Visit: Payer: Self-pay | Admitting: Internal Medicine

## 2012-11-13 ENCOUNTER — Ambulatory Visit (INDEPENDENT_AMBULATORY_CARE_PROVIDER_SITE_OTHER): Payer: 59 | Admitting: Internal Medicine

## 2012-11-13 ENCOUNTER — Encounter: Payer: Self-pay | Admitting: Internal Medicine

## 2012-11-13 VITALS — BP 128/90 | HR 49 | Temp 97.7°F | Resp 14 | Ht 67.03 in | Wt 234.0 lb

## 2012-11-13 DIAGNOSIS — Z125 Encounter for screening for malignant neoplasm of prostate: Secondary | ICD-10-CM

## 2012-11-13 DIAGNOSIS — I10 Essential (primary) hypertension: Secondary | ICD-10-CM

## 2012-11-13 DIAGNOSIS — E119 Type 2 diabetes mellitus without complications: Secondary | ICD-10-CM

## 2012-11-13 DIAGNOSIS — Z0000 Encounter for general adult medical examination without abnormal findings: Secondary | ICD-10-CM

## 2012-11-13 DIAGNOSIS — Z8042 Family history of malignant neoplasm of prostate: Secondary | ICD-10-CM

## 2012-11-13 DIAGNOSIS — E782 Mixed hyperlipidemia: Secondary | ICD-10-CM

## 2012-11-13 DIAGNOSIS — M109 Gout, unspecified: Secondary | ICD-10-CM

## 2012-11-13 LAB — MICROALBUMIN / CREATININE URINE RATIO
Creatinine,U: 259 mg/dL
Microalb Creat Ratio: 1.2 mg/g (ref 0.0–30.0)
Microalb, Ur: 3.1 mg/dL — ABNORMAL HIGH (ref 0.0–1.9)

## 2012-11-13 LAB — LIPID PANEL
HDL: 47.6 mg/dL (ref >39.00)
LDL Cholesterol: 96 mg/dL (ref 0–99)
Total CHOL/HDL Ratio: 3
Triglycerides: 107 mg/dL (ref 0.0–149.0)

## 2012-11-13 LAB — HEPATIC FUNCTION PANEL
ALT: 29 U/L (ref 0–53)
AST: 17 U/L (ref 0–37)
Albumin: 4 g/dL (ref 3.5–5.2)
Alkaline Phosphatase: 53 U/L (ref 39–117)
Bilirubin, Direct: 0.2 mg/dL (ref 0.0–0.3)
Total Bilirubin: 0.8 mg/dL (ref 0.3–1.2)
Total Protein: 7 g/dL (ref 6.0–8.3)

## 2012-11-13 LAB — BASIC METABOLIC PANEL
BUN: 22 mg/dL (ref 6–23)
CO2: 21 mEq/L (ref 19–32)
Calcium: 10.1 mg/dL (ref 8.4–10.5)
Chloride: 109 mEq/L (ref 96–112)
Creatinine, Ser: 1.3 mg/dL (ref 0.4–1.5)
Glucose, Bld: 114 mg/dL — ABNORMAL HIGH (ref 70–99)
Potassium: 4.8 mEq/L (ref 3.5–5.1)

## 2012-11-13 LAB — URIC ACID: Uric Acid, Serum: 4.3 mg/dL (ref 4.0–7.8)

## 2012-11-13 LAB — TSH: TSH: 1.7 u[IU]/mL (ref 0.35–5.50)

## 2012-11-13 MED ORDER — SILDENAFIL CITRATE 100 MG PO TABS: ORAL_TABLET | ORAL | Status: DC

## 2012-11-13 MED ORDER — PRAVASTATIN SODIUM 40 MG PO TABS: ORAL_TABLET | ORAL | Status: DC

## 2012-11-13 MED ORDER — SPIRONOLACTONE 25 MG PO TABS: ORAL_TABLET | ORAL | Status: DC

## 2012-11-13 MED ORDER — VERAPAMIL HCL ER 240 MG PO TBCR: EXTENDED_RELEASE_TABLET | ORAL | Status: DC

## 2012-11-13 MED ORDER — ALLOPURINOL 300 MG PO TABS: ORAL_TABLET | ORAL | Status: DC

## 2012-11-13 MED ORDER — GLIMEPIRIDE 2 MG PO TABS: ORAL_TABLET | ORAL | Status: DC

## 2012-11-13 MED ORDER — CLONIDINE HCL 0.1 MG PO TABS: 0.1000 mg | ORAL_TABLET | Freq: Two times a day (BID) | ORAL | Status: DC

## 2012-11-13 MED ORDER — CARVEDILOL 12.5 MG PO TABS: ORAL_TABLET | ORAL | Status: DC

## 2012-11-13 MED ORDER — SITAGLIPTIN PHOS-METFORMIN HCL 50-500 MG PO TABS: ORAL_TABLET | ORAL | Status: DC

## 2012-11-13 MED ORDER — LOSARTAN POTASSIUM 100 MG PO TABS: ORAL_TABLET | ORAL | Status: DC

## 2012-11-13 NOTE — Patient Instructions (Addendum)
Preventive Health Care: Exercise at least 30-45 minutes a day,  3-4 days a week.  Eat a low-fat diet with lots of fruits and vegetables, up to 7-9 servings per day. This would eliminate the need for vitamin supplements. Avoid obesity; your goal is waist measurement < 40 inches.Consume less than 40 grams of sugar (preferably ZERO) per day from foods & drinks with High Fructose Corn Sugar as #1,2,3 or # 4 on label. Dental work  necessary to decrease long term health risks as discussed  Living Will helps place you in charge of your health care decisions.

## 2012-11-13 NOTE — Progress Notes (Signed)
Subjective:    Patient ID: Noah Barnes, male    DOB: 29-Nov-1945, 67 y.o.   MRN: [id number]  HPI Medicare Wellness Visit:  Psychosocial & medical history were reviewed as required by Medicare (abuse,antisocial behavioral risks,firearm risk).  Social history: caffeine: none  , alcohol: 2 drinks/week  ,  tobacco use: quit 1993 Exercise :  Y 3- 4 X/week; walks 1-1.5 mpd & weights No home & personal  safety / fall risk Activities of daily living: no limitations  Seatbelt  and smoke alarm employed. Power of Attorney/Living Will status : Living Will needed Ophthalmology exam not current Hearing evaluation not current Orientation :oriented X 3  Memory & recall :good Spelling or math testing:good Mood & affect : normal . Depression / anxiety: denied Travel history : never  Immunization status : Shingles /Flu/ PNA never taken Transfusion history:  none  Preventive health surveillance ( colonoscopy as per protocol/ [facility]): due Dental care:  overdue. Chart reviewed &  Updated. Active issues reviewed & addressed.      Review of Systems HYPERTENSION: Disease Monitoring: Blood pressure range- 126-135/70+-80+  Chest pain, palpitations- no      Dyspnea- no Medications: Compliance-yes Lightheadedness,Syncope-no    Edema- occasional   DIABETES: Disease Monitoring: Blood Sugar ranges-130s  Polyuria/phagia/dipsia-no       Visual problems- no Medications: Compliance- yes  Hypoglycemic symptoms-no    HYPERLIPIDEMIA: Disease Monitoring: See symptoms for Hypertension Medications: Compliance- yes  Abd pain, bowel changes- constipation in context IBS   Muscle aches- no          Objective:   Physical Exam Gen.:  well-nourished in appearance. Alert, appropriate and cooperative throughout exam. Head: Normocephalic without obvious abnormalities. Moustache ; no alopecia  Eyes: No corneal or conjunctival inflammation noted. Pupils equal round reactive to light and accommodation.   Extraocular motion intact. Vision grossly normal with lenses Ears: External  ear exam reveals no significant lesions or deformities. Canals clear .TMs normal. Hearing is grossly normal bilaterally. Nose: External nasal exam reveals no deformity or inflammation. Nasal mucosa are pink and moist. No lesions or exudates noted.  Mouth: Oral mucosa and oropharynx reveal no lesions or exudates. Teeth in poor repair with plaque & caries. Neck: No deformities, masses, or tenderness noted. Range of motion & Thyroid normal. Lungs: Normal respiratory effort; chest expands symmetrically. Lungs are clear to auscultation without rales, wheezes, or increased work of breathing. Heart: Normal rate and rhythm. Normal S1 and S2. No gallop, click, or rub. Grade 1/6 systolic murmur Abdomen: Bowel sounds normal; abdomen soft and nontender. No masses or organomegaly. Ventral hernia noted. Genitalia: Genitalia normal except for left varices. Prostate exam suboptimal due to habitus                                Musculoskeletal/extremities: Accentuated curvature of upper thoracic  Spine. No clubbing, cyanosis,  or significant extremity  deformity noted. Range of motion normal .Tone & strength  Normal. Trace edema Joints normal. Nail health good. Able to lie down & sit up w/o help. Negative SLR bilaterally Vascular: Carotid, radial artery, dorsalis pedis and  posterior tibial pulses are full and equal. No bruits present. Neurologic: Alert and oriented x3. Deep tendon reflexes symmetrical and normal.   Light touch normal over feet.        Skin: Intact without suspicious lesions or rashes. Lymph: No cervical, axillary, or inguinal lymphadenopathy present. Psych: Mood and affect are normal.  Normally interactive                                                                                        Assessment & Plan:  #1 Medicare Wellness Exam; criteria met ; data entered #2 Problem List reviewed ; Assessment/  Recommendations made Plan: see Orders

## 2012-11-22 ENCOUNTER — Other Ambulatory Visit: Payer: Self-pay | Admitting: Internal Medicine

## 2012-11-22 DIAGNOSIS — E119 Type 2 diabetes mellitus without complications: Secondary | ICD-10-CM

## 2012-12-26 ENCOUNTER — Encounter: Payer: BC Managed Care – PPO | Admitting: Internal Medicine

## 2012-12-30 ENCOUNTER — Other Ambulatory Visit: Payer: Self-pay | Admitting: Internal Medicine

## 2012-12-30 NOTE — Telephone Encounter (Signed)
Refill done.  

## 2012-12-30 NOTE — Telephone Encounter (Signed)
Spoke to sean @ [facility]. viagra was originally sent into pharmacy by Dr. Linna Darner on 5.14.14. Pt has switched pharmacies. Remaining refills at [place] were cancelled & sent new rx to [facility].  Refill done.

## 2013-02-05 ENCOUNTER — Other Ambulatory Visit: Payer: Self-pay

## 2013-03-25 ENCOUNTER — Other Ambulatory Visit (INDEPENDENT_AMBULATORY_CARE_PROVIDER_SITE_OTHER): Payer: 59

## 2013-03-25 DIAGNOSIS — E119 Type 2 diabetes mellitus without complications: Secondary | ICD-10-CM

## 2013-03-25 LAB — HEMOGLOBIN A1C: Hgb A1c MFr Bld: 7.2 % — ABNORMAL HIGH (ref 4.6–6.5)

## 2013-05-01 ENCOUNTER — Other Ambulatory Visit: Payer: Self-pay | Admitting: Internal Medicine

## 2013-05-01 NOTE — Telephone Encounter (Signed)
Viagra refill sent to pharmacy. 

## 2013-05-08 ENCOUNTER — Other Ambulatory Visit: Payer: Self-pay

## 2013-06-04 ENCOUNTER — Other Ambulatory Visit: Payer: Self-pay | Admitting: Internal Medicine

## 2013-06-05 ENCOUNTER — Telehealth: Payer: Self-pay | Admitting: *Deleted

## 2013-06-05 NOTE — Telephone Encounter (Signed)
06/05/2013  Pt dropped off DOT Medical Review forms.  Attached billing form, put in Sandra's folder.  bw

## 2013-06-05 NOTE — Telephone Encounter (Signed)
Viagra refilled per protocol.

## 2013-06-06 NOTE — Telephone Encounter (Signed)
Forms partially completed and placed in Dr. Clayborn Heron folder for final completion.

## 2013-06-08 ENCOUNTER — Encounter: Payer: Self-pay | Admitting: Internal Medicine

## 2013-06-13 ENCOUNTER — Telehealth: Payer: Self-pay | Admitting: *Deleted

## 2013-06-13 NOTE — Telephone Encounter (Signed)
Message copied by Chilton Greathouse on Fri Jun 13, 2013  4:46 PM ------      Message from: Hendricks Limes      Created: Sun Jun 08, 2013 10:40 AM       Thank you for addressing DOT form. He needs OV with glucose & BP readings.      Last A1c revealed uncontrolled Diabetes. ------

## 2013-06-13 NOTE — Telephone Encounter (Signed)
Left message on voice mail that pt needs OV for B/P and glucose check.

## 2013-06-16 ENCOUNTER — Other Ambulatory Visit: Payer: 59

## 2013-06-16 NOTE — Telephone Encounter (Signed)
Patient called and his is scheduled to come in tomorrow at 10:00am for labs and blood pressure check.

## 2013-06-16 NOTE — Telephone Encounter (Signed)
Thanks for handling that.

## 2013-06-17 ENCOUNTER — Other Ambulatory Visit: Payer: Self-pay | Admitting: Internal Medicine

## 2013-06-17 DIAGNOSIS — E119 Type 2 diabetes mellitus without complications: Secondary | ICD-10-CM

## 2013-06-17 NOTE — Telephone Encounter (Signed)
Referral to endocrinology placed per VO from Dr. Linna Darner

## 2013-06-19 ENCOUNTER — Ambulatory Visit (INDEPENDENT_AMBULATORY_CARE_PROVIDER_SITE_OTHER): Payer: Medicare Other | Admitting: Endocrinology

## 2013-06-19 ENCOUNTER — Other Ambulatory Visit: Payer: Self-pay | Admitting: *Deleted

## 2013-06-19 ENCOUNTER — Encounter: Payer: Self-pay | Admitting: Endocrinology

## 2013-06-19 VITALS — BP 134/82 | HR 54 | Temp 98.1°F | Resp 12 | Ht 68.0 in | Wt 233.2 lb

## 2013-06-19 DIAGNOSIS — F528 Other sexual dysfunction not due to a substance or known physiological condition: Secondary | ICD-10-CM

## 2013-06-19 DIAGNOSIS — E782 Mixed hyperlipidemia: Secondary | ICD-10-CM

## 2013-06-19 DIAGNOSIS — E1129 Type 2 diabetes mellitus with other diabetic kidney complication: Secondary | ICD-10-CM

## 2013-06-19 DIAGNOSIS — I10 Essential (primary) hypertension: Secondary | ICD-10-CM

## 2013-06-19 LAB — COMPREHENSIVE METABOLIC PANEL
ALT: 40 U/L (ref 0–53)
AST: 18 U/L (ref 0–37)
Alkaline Phosphatase: 59 U/L (ref 39–117)
BUN: 18 mg/dL (ref 6–23)
CO2: 23 mEq/L (ref 19–32)
Calcium: 9 mg/dL (ref 8.4–10.5)
Chloride: 108 mEq/L (ref 96–112)
Creatinine, Ser: 1.3 mg/dL (ref 0.4–1.5)
GFR: 72.21 mL/min (ref >60.00)
Glucose, Bld: 160 mg/dL — ABNORMAL HIGH (ref 70–99)
Potassium: 4.6 mEq/L (ref 3.5–5.1)
Sodium: 138 mEq/L (ref 135–145)
Total Bilirubin: 0.6 mg/dL (ref 0.3–1.2)
Total Protein: 6.8 g/dL (ref 6.0–8.3)

## 2013-06-19 LAB — HEMOGLOBIN A1C: Hgb A1c MFr Bld: 7.1 % — ABNORMAL HIGH (ref 4.6–6.5)

## 2013-06-19 LAB — GLUCOSE, POCT (MANUAL RESULT ENTRY): POC Glucose: 202 mg/dl — AB (ref 70–99)

## 2013-06-19 MED ORDER — CANAGLIFLOZIN 300 MG PO TABS: 300.0000 mg | ORAL_TABLET | Freq: Every day | ORAL | Status: DC

## 2013-06-19 NOTE — Progress Notes (Signed)
Patient ID: Noah Barnes, male   DOB: Feb 18, 1946, 67 y.o.   MRN: [id number]    Reason for Appointment : Consultation for Type 2 Diabetes  History of Present Illness          Diagnosis: Type 2 diabetes mellitus, date of diagnosis: 2008       Past history: Since his diabetes was borderline in the beginning and not clear what medications he was started on. Has been taking metformin for several years and this was later changed to [place]. Amaryl probably started in 5/14 when blood sugars were higher, initially was given 1 mg and now has been taking 2 mg.  Recent history: His A1c had increased on his last visit in 9/14 and he has been referred here for further management He has been checking his blood sugar periodically and he does not think they are any higher recently. Normally checking before meals only       Oral hypoglycemic drugs the patient is taking are: Janumet 50/500 twice a day and Amaryl 2 mg in a.m.      Side effects from medications have been:  none  Glucose monitoring:  done one-2 times a day         Glucometer: One Touch   Blood Glucose readings from recall:  PREMEAL Breakfast Lunch Dinner Bedtime Overnight  Glucose range: 150-160  120s ?   Median:        Hypoglycemia: None      Glycemic control:  Lab Results  Component Value Date   HGBA1C 7.2* 03/25/2013   HGBA1C 6.6* 11/13/2012   HGBA1C 6.6* 12/26/2011   Lab Results  Component Value Date   MICROALBUR 3.1* 11/13/2012   LDLCALC 96 11/13/2012   CREATININE 1.3 11/13/2012    Self-care: The diet that the patient has been following is: None. Eating out 3-4 times a week including [place] food. He thinks he can do better with his meal planning. Meals: 3 meals per day     Exercise:  walking 3/7  days a week       Dietician consultations: None              Retinal exam: Most recent: About 1-1/2 years ago    Weight history:  Wt Readings from Last 3 Encounters:  06/19/13 233 lb 3.2 oz (105.779 kg)  11/13/12 234 lb  (106.142 kg)  01/22/12 233 lb (105.688 kg)      Medication List       This list is accurate as of: 06/19/13  9:17 AM.  Always use your most recent med list.               allopurinol 300 MG tablet  Commonly known as:  ZYLOPRIM  TAKE ONE TABLET BY MOUTH EVERY DAY     carvedilol 12.5 MG tablet  Commonly known as:  COREG  TAKE ONE-HALF TABLET BY MOUTH TWICE DAILY AND INCREASE TO ONE TABLET BY MOUTH TWICE A DAY IF BLOOD PRESSURE > 135/85     cloNIDine 0.1 MG tablet  Commonly known as:  CATAPRES  Take 1 tablet (0.1 mg total) by mouth 2 (two) times daily.     glimepiride 2 MG tablet  Commonly known as:  AMARYL  TAKE ONE-HALF TABLET BY MOUTH EVERY DAY     losartan 100 MG tablet  Commonly known as:  COZAAR  TAKE ONE TABLET BY MOUTH EVERY DAY     ONE TOUCH ULTRA TEST test strip  Generic drug:  glucose blood  TEST as directed     pravastatin 40 MG tablet  Commonly known as:  PRAVACHOL  1 by mouth daily     sildenafil 100 MG tablet  Commonly known as:  VIAGRA  take by mouth as directed DO NOT take with NITROGLYCERIN     VIAGRA 100 MG tablet  Generic drug:  sildenafil  TAKE AS DIRECTED **DO  NOT  TAKE  WITH  NITROGLYCERIN**     sitaGLIPtin-metformin 50-500 MG per tablet  Commonly known as:  JANUMET  TAKE ONE TABLET BY MOUTH TWICE DAILY WITH 2 LARGEST MEALS     spironolactone 25 MG tablet  Commonly known as:  ALDACTONE  TAKE ONE TABLET BY MOUTH EVERY DAY     verapamil 240 MG CR tablet  Commonly known as:  CALAN-SR  TAKE ONE-HALF TABLET BY MOUTH TWICE DAILY        Allergies: No Known Allergies  Past Medical History  Diagnosis Date  . Hypertension   . Diabetes mellitus   . Hyperlipidemia   . Gout   . ED (erectile dysfunction)     Past Surgical History  Procedure Laterality Date  . Colonoscopy  1998    negative  . Knee effusion tapped      Family History  Problem Relation Age of Onset  . Heart attack Mother 27  . Prostate cancer Father   .  Diabetes Father   . Cancer Brother     stomach cancer  . Stroke Neg Hx   . Heart disease Neg Hx     Social History:  reports that he quit smoking about 21 years ago. He has never used smokeless tobacco. He reports that he drinks about 1.2 ounces of alcohol per week. He reports that he does not use illicit drugs.    Review of Systems       Lipids: Has been on pravastatin for hyperlipidemia, baseline LDL? 162; labs as follows  Lab Results  Component Value Date   CHOL 165 11/13/2012   HDL 47.60 11/13/2012   LDLCALC 96 11/13/2012   LDLDIRECT 161.8 05/09/2007   TRIG 107.0 11/13/2012   CHOLHDL 3 11/13/2012        No unusual headaches.                  Skin: No rash or infections     Thyroid:  No  unusual fatigue.     The blood pressure has been high for several years and usually well controlled. He does monitor his blood pressure periodically at home     No swelling of feet.     No shortness of breath on exertion.     Bowel habits: IBS constip       No frequency of urination or nocturia             He has had erectile dysfunction for the last 2-3 years. Has normal libido       No joint  pains.         No history of Numbness, tingling or burning in feet     LABS:  Orders Only on 06/19/2013  Component Date Value Range Status  . POC Glucose 06/19/2013 202* 70 - 99 mg/dl Final    Physical Examination:  BP 134/82  Pulse 54  Temp(Src) 98.1 F (36.7 C)  Resp 12  Ht 5\' 8"  (1.727 m)  Wt 233 lb 3.2 oz (105.779 kg)  BMI 35.47 kg/m2  SpO2 96%  GENERAL:  Patient has abdominal obesity. Well-built and nourished  HEENT:         Eye exam shows normal external appearance. Fundus exam shows no retinopathy. Oral exam shows normal mucosa .  NECK:         General:  Neck exam shows no lymphadenopathy. Carotids are normal to palpation and no bruit heard. Thyroid is not enlarged and no nodules felt.   LUNGS:         Chest is symmetrical. Lungs are clear to auscultation.[name]    HEART:         Heart sounds:  S1 and S2 are normal. No murmurs or clicks heard., no S3 or S4.   ABDOMEN:         General:  There is no distention present. Liver and spleen are not palpable. No other mass or tenderness present.  EXTREMITIES:     There is no edema. No skin lesions present.[name]  NEUROLOGICAL:        Vibration sense is minimally reduced in toes. Ankle jerks are absent bilaterally.          Diabetic foot exam:  as in the foot exam section MUSCULOSKELETAL:       There is no enlargement or deformity of the joints. Spine is normal to inspection.[name]   PEDAL pulses: Normal SKIN:       No rash or lesions of concern.        ASSESSMENT:  Diabetes type 2, uncontrolled with obesity, BMI 35     His last A1c was 7.2% and his glucose is significantly high at 202 fasting today in the office Most likely is having progression of his diabetes and decrease in insulin secretion. He also has not much success with losing weight on his own Has not had any previous diabetes education At present is taking low doses of metformin and Amaryl as well as Januvia  Complications: Erectile dysfunction, no evidence of nephropathy or neuropathy. Overdue for eye exam  PLAN:   Discussed patient the need for weight loss along with avoiding progression of his diabetes He is a good candidate for a GLP-1 drug which would have multiple benefits on his diabetes pathophysiology and promote weight loss but he is reluctant to start an injectable drug today He is agreeable to starting Invokana after explaining how this works, effects on glucose and weight as well as benefits and side effects He will start with 100 mg in the morning as a sample and then 300 mg on prescription. For now will reduce his Amaryl to 1 mg and have him take this in the evening to help with fasting hyperglycemia He will continue Janumet for now and would consider increasing the metformin component to 1000 mg twice a day on the next visit Discussed  timing and targets of glucose monitoring including postprandial readings Advised him to bring his glucose monitor for review on the next visit Consultation with DIETITIAN visit will be scheduled also He needs a followup A1c today since it has been about 3 months since his last test and also followup chemistry panel  Total visit time including counseling 45 minutes  Coron Rossano 06/19/2013, 9:17 AM

## 2013-06-19 NOTE — Patient Instructions (Addendum)
Glimeperide 1/2 at supper  Invokana 100mg  in am for 5 days then 300mg  daily  Leave off Spironolactone

## 2013-07-01 ENCOUNTER — Other Ambulatory Visit: Payer: Self-pay | Admitting: Internal Medicine

## 2013-07-01 NOTE — Telephone Encounter (Signed)
Viagra refilled per protocol. JG//CMA

## 2013-07-10 ENCOUNTER — Telehealth: Payer: Self-pay | Admitting: Internal Medicine

## 2013-07-10 NOTE — Telephone Encounter (Signed)
On 06/05/2013 Pt dropped off DOT Medical Review forms. He has not received a call from our office about them being completed at states that they are trying to take hs license away effective tomorrow. He is requesting to speak with Dr. Linna Darner about why forms were not completed and states that if he cannot speak with him on the phone he will make an appointment to see him about this matter. Please advise.

## 2013-07-11 NOTE — Telephone Encounter (Signed)
Patient is calling back again about DOT forms. Please follow-up.

## 2013-07-11 NOTE — Telephone Encounter (Signed)
Pt was informed that he needed a OV.  Pt scheduled an appt with Dr. Linna Darner on Tues(07-14-13).//AB/CMA

## 2013-07-13 ENCOUNTER — Other Ambulatory Visit: Payer: Self-pay | Admitting: Internal Medicine

## 2013-07-14 NOTE — Telephone Encounter (Signed)
Janumet refilled per protocol. JG//CMA

## 2013-07-15 ENCOUNTER — Ambulatory Visit (INDEPENDENT_AMBULATORY_CARE_PROVIDER_SITE_OTHER): Payer: 59 | Admitting: Internal Medicine

## 2013-07-15 ENCOUNTER — Encounter: Payer: Self-pay | Admitting: Internal Medicine

## 2013-07-15 VITALS — BP 130/74 | HR 52 | Temp 98.2°F | Wt 228.8 lb

## 2013-07-15 DIAGNOSIS — E1129 Type 2 diabetes mellitus with other diabetic kidney complication: Secondary | ICD-10-CM

## 2013-07-15 DIAGNOSIS — E1165 Type 2 diabetes mellitus with hyperglycemia: Principal | ICD-10-CM

## 2013-07-15 NOTE — Progress Notes (Signed)
Pre visit review using our clinic review tool, if applicable. No additional management support is needed unless otherwise documented below in the visit note. 

## 2013-07-15 NOTE — Progress Notes (Signed)
Subjective:    Patient ID: Noah Barnes, male    DOB: 09/30/1945, 68 y.o.   MRN: [id number]  HPI  Diabetes status assessment: Fasting or morning glucose  average is <130 Highest glucose 2 hours after any meal is <160. No hypoglycemia reported                                                                                                                 Regular exercise described as walking  3 times per week for 30-40 minutes. No specific nutrition/diet followed Medication compliance is good. No medication adverse effects noted. Eye exam to be scheduled. Foot care not current  A1c 7.1% on 06/19/13         Review of Systems No excess thirst ;  excess hunger ; or excess urination reported                              No lightheadedness with standing reported No chest pain ; palpitations ; claudication described .                                                                                                                             No non healing skin  ulcers or sores of extremities noted. No numbness or tingling or burning in feet described                                                                                                                                             No significant change in weight. No blurred,double, or loss of vision reported  .      Objective:   Physical Exam Appears  well-nourished & in no acute distress. Weight excess  No carotid bruits are present.No neck pain distention present at 10 - 15 degrees. Thyroid  normal to palpation  Heart rhythm and rate are normal with no significant murmurs or gallops.  Chest is clear with no increased work of breathing  There is no evidence of aortic aneurysm or renal artery bruits  Abdomen soft but protuberant  with no organomegaly or masses. No HJR  No clubbing, cyanosis or edema present.  Pedal pulses are intact   No ischemic skin changes are present . Fingernails healthy    Alert and oriented.  Strength, tone normal          Assessment & Plan:  See Current Assessment & Plan in Problem List under specific Diagnosis

## 2013-07-15 NOTE — Patient Instructions (Signed)
Once you're off the sulfonylurea (glimepiride) there will be no contraindication to licensure. Continue the  program you have started  to guarantee diabetic control without hypoglycemia.

## 2013-07-15 NOTE — Assessment & Plan Note (Signed)
He will wean off the sulfonylurea to eliminate the risk of hypoglycemia. His home glucose recordings indicate strong motivation for diabetic control.

## 2013-07-16 ENCOUNTER — Telehealth: Payer: Self-pay

## 2013-07-16 NOTE — Telephone Encounter (Signed)
Relevant patient education assigned to patient using Emmi. ° °

## 2013-07-22 ENCOUNTER — Other Ambulatory Visit (INDEPENDENT_AMBULATORY_CARE_PROVIDER_SITE_OTHER): Payer: 59

## 2013-07-22 ENCOUNTER — Other Ambulatory Visit: Payer: 59

## 2013-07-22 DIAGNOSIS — E1165 Type 2 diabetes mellitus with hyperglycemia: Principal | ICD-10-CM

## 2013-07-22 DIAGNOSIS — E1129 Type 2 diabetes mellitus with other diabetic kidney complication: Secondary | ICD-10-CM

## 2013-07-22 LAB — BASIC METABOLIC PANEL
BUN: 20 mg/dL (ref 6–23)
CALCIUM: 10 mg/dL (ref 8.4–10.5)
CO2: 23 mEq/L (ref 19–32)
Chloride: 108 mEq/L (ref 96–112)
Creatinine, Ser: 1.5 mg/dL (ref 0.4–1.5)
GFR: 61.05 mL/min (ref >60.00)
Glucose, Bld: 126 mg/dL — ABNORMAL HIGH (ref 70–99)
Potassium: 5 mEq/L (ref 3.5–5.1)
Sodium: 138 mEq/L (ref 135–145)

## 2013-07-22 LAB — FRUCTOSAMINE: Fructosamine: 234 umol/L (ref <285)

## 2013-07-25 ENCOUNTER — Ambulatory Visit (INDEPENDENT_AMBULATORY_CARE_PROVIDER_SITE_OTHER): Payer: 59 | Admitting: Endocrinology

## 2013-07-25 ENCOUNTER — Encounter: Payer: Self-pay | Admitting: Endocrinology

## 2013-07-25 VITALS — BP 122/70 | HR 58 | Temp 98.3°F | Resp 14 | Ht 68.0 in | Wt 229.2 lb

## 2013-07-25 DIAGNOSIS — I10 Essential (primary) hypertension: Secondary | ICD-10-CM

## 2013-07-25 DIAGNOSIS — E1129 Type 2 diabetes mellitus with other diabetic kidney complication: Secondary | ICD-10-CM

## 2013-07-25 DIAGNOSIS — N289 Disorder of kidney and ureter, unspecified: Secondary | ICD-10-CM

## 2013-07-25 DIAGNOSIS — E1165 Type 2 diabetes mellitus with hyperglycemia: Principal | ICD-10-CM

## 2013-07-25 NOTE — Patient Instructions (Addendum)
Reduce Losartan to 1/2 tab  If sugar < 90 may stop Glimeperide

## 2013-07-25 NOTE — Progress Notes (Signed)
Patient ID: Noah Barnes, male   DOB: 07-Nov-1945, 68 y.o.   MRN: [id number]    Reason for Appointment : for Type 2 Diabetes  History of Present Illness          Diagnosis: Type 2 diabetes mellitus, date of diagnosis: 2008       Past history: His diabetes was borderline in the beginning and not clear what medications he was started on. Has been taking metformin for several years and this was later changed to [place]. Amaryl probably started in 5/14 when blood sugars were higher, initially was given 1 mg and now has been taking 2 mg. His A1c had increased on his  visit in 9/14 and he was referred here for further management  Recent history:  Because of his relatively higher A1c, difficulty losing weight and a glucose of 202 on his initial consultation he was given Invokana in addition to his Janumet and Amaryl With this his blood sugars have improved progressively and are looking excellent now This is despite reducing his Amaryl to 1 mg in the evening only He has been checking his blood sugar periodically after her evening meal also       Oral hypoglycemic drugs the patient is taking are: Janumet 50/500 twice a day, Invokana 300 mg and Amaryl 1mg  in pm     Side effects from medications have been:  none  Glucose monitoring:  done 1-2 times a day         Glucometer: One Touch   Blood Glucose readings recently from monitor download  PREMEAL Breakfast Lunch Dinner Bedtime  overall   Glucose range:  92-137   99  82-102   93-126    Median:      114    Hypoglycemia: None      Glycemic control:  Lab Results  Component Value Date   HGBA1C 7.1* 06/19/2013   HGBA1C 7.2* 03/25/2013   HGBA1C 6.6* 11/13/2012   Lab Results  Component Value Date   MICROALBUR 3.1* 11/13/2012   LDLCALC 96 11/13/2012   CREATININE 1.5 07/22/2013    Self-care: The diet that the patient has been following is: None. Eating out 3-4 times a week including [place] food. He thinks he can do better with his meal planning  and is scheduled to see dietitian.   Exercise:  walking 3/7 days a week  at [employer] consultations: Pending              Retinal exam: Most recent: About 1-1/2 years ago    Weight history:  Wt Readings from Last 3 Encounters:  07/25/13 229 lb 3.2 oz (103.964 kg)  07/15/13 228 lb 12.8 oz (103.783 kg)  06/19/13 233 lb 3.2 oz (105.779 kg)      Medication List       This list is accurate as of: 07/25/13  8:29 AM.  Always use your most recent med list.               allopurinol 300 MG tablet  Commonly known as:  ZYLOPRIM  TAKE ONE TABLET BY MOUTH EVERY DAY     Canagliflozin 300 MG Tabs  Take 1 tablet (300 mg total) by mouth daily before breakfast.     carvedilol 12.5 MG tablet  Commonly known as:  COREG  TAKE ONE-HALF TABLET BY MOUTH TWICE DAILY AND INCREASE TO ONE TABLET BY MOUTH TWICE A DAY IF BLOOD PRESSURE > 135/85     cloNIDine 0.1 MG tablet  Commonly known as:  CATAPRES  Take 1 tablet (0.1 mg total) by mouth 2 (two) times daily.     glimepiride 2 MG tablet  Commonly known as:  AMARYL  TAKE ONE-HALF TABLET BY MOUTH EVERY DAY     JANUMET 50-500 MG per tablet  Generic drug:  sitaGLIPtin-metformin  TAKE ONE TABLET BY MOUTH TWICE DAILY WITH  2  LARGEST  MEALS     losartan 100 MG tablet  Commonly known as:  COZAAR  TAKE ONE TABLET BY MOUTH EVERY DAY     ONE TOUCH ULTRA TEST test strip  Generic drug:  glucose blood  TEST as directed     pravastatin 40 MG tablet  Commonly known as:  PRAVACHOL  1 by mouth daily     sildenafil 100 MG tablet  Commonly known as:  VIAGRA  take by mouth as directed DO NOT take with NITROGLYCERIN     verapamil 240 MG CR tablet  Commonly known as:  CALAN-SR  TAKE ONE-HALF TABLET BY MOUTH TWICE DAILY        Allergies: No Known Allergies  Past Medical History  Diagnosis Date  . Hypertension   . Diabetes mellitus   . Hyperlipidemia   . Gout   . ED (erectile dysfunction)     Past Surgical History  Procedure  Laterality Date  . Colonoscopy  1998    negative  . Knee effusion tapped      Family History  Problem Relation Age of Onset  . Heart attack Mother 48  . Prostate cancer Father   . Diabetes Father   . Cancer Brother     stomach cancer  . Stroke Neg Hx   . Heart disease Neg Hx     Social History:  reports that he quit smoking about 22 years ago. He has never used smokeless tobacco. He reports that he drinks about 1.2 ounces of alcohol per week. He reports that he does not use illicit drugs.    Review of Systems       Lipids: Has been on pravastatin for hyperlipidemia, baseline LDL? 162; labs as follows  Lab Results  Component Value Date   CHOL 165 11/13/2012   HDL 47.60 11/13/2012   LDLCALC 96 11/13/2012   LDLDIRECT 161.8 05/09/2007   TRIG 107.0 11/13/2012   CHOLHDL 3 11/13/2012       The blood pressure has been high for several years and usually well controlled. He does monitor his blood pressure periodically at home: 120/70. No lightheadedness with starting Invokana    LABS:  Appointment on 07/22/2013  Component Date Value Range Status  . Sodium 07/22/2013 138  135 - 145 mEq/L Final  . Potassium 07/22/2013 5.0  3.5 - 5.1 mEq/L Final  . Chloride 07/22/2013 108  96 - 112 mEq/L Final  . CO2 07/22/2013 23  19 - 32 mEq/L Final  . Glucose, Bld 07/22/2013 126* 70 - 99 mg/dL Final  . BUN 07/22/2013 20  6 - 23 mg/dL Final  . Creatinine, Ser 07/22/2013 1.5  0.4 - 1.5 mg/dL Final  . Calcium 07/22/2013 10.0  8.4 - 10.5 mg/dL Final  . GFR 07/22/2013 61.05  >60.00 mL/min Final  . Fructosamine 07/22/2013 234  <285 umol/L Final   Comment:                            Variations in levels of serum proteins (albumin and immunoglobulins)  may affect fructosamine results.                               Physical Examination:  BP 122/70  Pulse 58  Temp(Src) 98.3 F (36.8 C)  Resp 14  Ht 5\' 8"  (1.727 m)  Wt 229 lb 3.2 oz (103.964 kg)  BMI 34.86 kg/m2  SpO2  94%      ASSESSMENT/PLAN:   Diabetes type 2, uncontrolled with obesity, BMI 35     His blood sugars are significantly better with adding Invokana and looked fairly normal recently He is also taking less Amaryl and may be able to stop this if morning glucose is below 90 He will be seen by dietitian for meal planning and this will help further with weight loss He is quite compliant with exercise For now he will continue the same regimen and return in followup for A1c  Hypertension: Well controlled  Renal dysfunction: His creatinine is relatively higher at 1.5 possibly from volume depletion with starting Invokana He was reduce his losartan to half a tablet for now  [facility] 07/25/2013, 8:29 AM

## 2013-08-12 ENCOUNTER — Encounter: Payer: Self-pay | Admitting: *Deleted

## 2013-08-12 ENCOUNTER — Encounter: Payer: Medicare Other | Attending: Endocrinology | Admitting: *Deleted

## 2013-08-12 VITALS — Ht 68.0 in | Wt 227.3 lb

## 2013-08-12 DIAGNOSIS — E1165 Type 2 diabetes mellitus with hyperglycemia: Secondary | ICD-10-CM

## 2013-08-12 DIAGNOSIS — E1129 Type 2 diabetes mellitus with other diabetic kidney complication: Secondary | ICD-10-CM

## 2013-08-12 DIAGNOSIS — E119 Type 2 diabetes mellitus without complications: Secondary | ICD-10-CM | POA: Insufficient documentation

## 2013-08-12 NOTE — Progress Notes (Signed)
Appt start time: 0830 end time:  1000.  Assessment:  Patient was seen on  08/12/13 for individual diabetes education. Patient lives with his wife, they both shop for food and wife cooks most meals. SMBG 2-3 times a day with reported range of 98 - 154 mg/dl including pre and post meals. 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   Current HbA1c: 7.1 % December 2014  Preferred Learning Style:   No preference indicated   Learning Readiness:   Ready  Change in progress  MEDICATIONS: see list, diabetes medications include Amaryl, Janumet and now Invokana  DIETARY INTAKE:  24-hr recall:  B ( AM): 1 whole wheat toast with butter, occasionally a boiled egg, decaf coffee with Splenda Snk ( AM): no  L ( PM): skips occasionally OR fast food sandwich such as burger, fish filet or grilled chicken, occasionally fries, water Snk ( PM): no D ( PM): lean meat, starch, vegetables OR meal salad with lean meat, water Snk ( PM): no Beverages: decaf coffee, water or occasional diet soda  Usual physical activity: coaches basketball and works out at gym 2-3 days a week  Estimated energy needs: 1500 calories 170 g carbohydrates 112 g protein 42 g fat    Intervention:  Nutrition counseling provided.  Discussed diabetes disease process and treatment options.  Discussed physiology of diabetes and role of obesity on insulin resistance.  Encouraged moderate weight reduction to improve glucose levels.  Discussed role of medications and diet in glucose control  Provided education on macronutrients on glucose levels.  Provided education on carb counting, importance of regularly scheduled meals/snacks, and meal planning  Discussed effects of physical activity on glucose levels and long-term glucose control.  Recommended 150 minutes of physical activity/week.  Reviewed patient medications.  Discussed role of medication on blood glucose and possible side effects  Discussed blood  glucose monitoring and interpretation.  Discussed recommended target ranges and individual ranges.    Described short-term complications: hyper- and hypo-glycemia.  Discussed causes,symptoms, and treatment options.  Discussed prevention, detection, and treatment of long-term complications.  Discussed the role of prolonged elevated glucose levels on body systems.  Discussed role of stress on blood glucose levels and discussed strategies to manage psychosocial issues.  Discussed recommendations for long-term diabetes self-care.  Established checklist for medical, dental, and emotional self-care.  Plan:  Aim for 3 Carb Choices per meal (45 grams) +/- 1 either way  Aim for 0-1 Carbs per snack if hungry  Consider reading food labels for Total Carbohydrate of foods Continue with your activity level daily as tolerated Continue checking BG at alternate times per day as directed by MD  Continue taking medication as directed by MD  Teaching Method Utilized: Visual, Auditory and Hands on  Handouts given during visit include: Living Well with Diabetes Carb Counting and Food Label handouts Meal Plan Card  Diabetes Medication handout  Barriers to learning/adherence to lifestyle change: none  Diabetes self-care support plan:   [facility] support group  Demonstrated degree of understanding via:  Teach Back   Monitoring/Evaluation:  Dietary intake, exercise, reading food labels, and body weight prn.

## 2013-08-12 NOTE — Patient Instructions (Signed)
Plan:  Aim for 3 Carb Choices per meal (45 grams) +/- 1 either way  Aim for 0-1 Carbs per snack if hungry  Consider reading food labels for Total Carbohydrate of foods Continue with your activity level daily as tolerated Continue checking BG at alternate times per day as directed by MD  Continue taking medication as directed by MD

## 2013-09-03 ENCOUNTER — Other Ambulatory Visit: Payer: Self-pay | Admitting: *Deleted

## 2013-09-03 MED ORDER — ONETOUCH LANCETS MISC: Status: DC

## 2013-09-03 NOTE — Telephone Encounter (Signed)
Rx sent to the pharmacy by e-script.//AB/CMA 

## 2013-09-18 ENCOUNTER — Other Ambulatory Visit (INDEPENDENT_AMBULATORY_CARE_PROVIDER_SITE_OTHER): Payer: 59

## 2013-09-18 ENCOUNTER — Other Ambulatory Visit: Payer: 59

## 2013-09-18 DIAGNOSIS — E1129 Type 2 diabetes mellitus with other diabetic kidney complication: Secondary | ICD-10-CM

## 2013-09-18 DIAGNOSIS — E1165 Type 2 diabetes mellitus with hyperglycemia: Principal | ICD-10-CM

## 2013-09-18 LAB — LIPID PANEL
Cholesterol: 189 mg/dL (ref 0–200)
HDL: 45.5 mg/dL (ref >39.00)
LDL Cholesterol: 122 mg/dL — ABNORMAL HIGH (ref 0–99)
Total CHOL/HDL Ratio: 4
Triglycerides: 107 mg/dL (ref 0.0–149.0)
VLDL: 21.4 mg/dL (ref 0.0–40.0)

## 2013-09-18 LAB — COMPREHENSIVE METABOLIC PANEL
ALT: 28 U/L (ref 0–53)
AST: 14 U/L (ref 0–37)
Albumin: 4 g/dL (ref 3.5–5.2)
Alkaline Phosphatase: 60 U/L (ref 39–117)
BILIRUBIN TOTAL: 0.6 mg/dL (ref 0.3–1.2)
BUN: 23 mg/dL (ref 6–23)
CO2: 18 mEq/L — ABNORMAL LOW (ref 19–32)
Calcium: 9.5 mg/dL (ref 8.4–10.5)
Chloride: 111 mEq/L (ref 96–112)
Creatinine, Ser: 1.6 mg/dL — ABNORMAL HIGH (ref 0.4–1.5)
GFR: 54.59 mL/min — ABNORMAL LOW (ref >60.00)
Glucose, Bld: 141 mg/dL — ABNORMAL HIGH (ref 70–99)
Potassium: 4.4 mEq/L (ref 3.5–5.1)
Sodium: 137 mEq/L (ref 135–145)
Total Protein: 7.1 g/dL (ref 6.0–8.3)

## 2013-09-18 LAB — URINALYSIS, ROUTINE W REFLEX MICROSCOPIC
Bilirubin Urine: NEGATIVE
Hgb urine dipstick: NEGATIVE
Ketones, ur: NEGATIVE
Leukocytes, UA: NEGATIVE
Nitrite: NEGATIVE
RBC / HPF: NONE SEEN (ref 0–?)
Specific Gravity, Urine: 1.025 (ref 1.000–1.030)
Total Protein, Urine: NEGATIVE
Urobilinogen, UA: 0.2 (ref 0.0–1.0)
pH: 5.5 (ref 5.0–8.0)

## 2013-09-18 LAB — MICROALBUMIN / CREATININE URINE RATIO
Creatinine,U: 118.3 mg/dL
Microalb Creat Ratio: 1 mg/g (ref 0.0–30.0)
Microalb, Ur: 1.2 mg/dL (ref 0.0–1.9)

## 2013-09-18 LAB — HEMOGLOBIN A1C: Hgb A1c MFr Bld: 6.3 % (ref 4.6–6.5)

## 2013-09-22 ENCOUNTER — Encounter: Payer: Self-pay | Admitting: Endocrinology

## 2013-09-22 ENCOUNTER — Telehealth: Payer: Self-pay | Admitting: Endocrinology

## 2013-09-22 ENCOUNTER — Ambulatory Visit (INDEPENDENT_AMBULATORY_CARE_PROVIDER_SITE_OTHER): Payer: 59 | Admitting: Endocrinology

## 2013-09-22 ENCOUNTER — Other Ambulatory Visit: Payer: Self-pay | Admitting: *Deleted

## 2013-09-22 VITALS — BP 124/74 | HR 65 | Temp 98.0°F | Resp 16 | Ht 68.0 in | Wt 228.8 lb

## 2013-09-22 DIAGNOSIS — E1165 Type 2 diabetes mellitus with hyperglycemia: Principal | ICD-10-CM

## 2013-09-22 DIAGNOSIS — I10 Essential (primary) hypertension: Secondary | ICD-10-CM

## 2013-09-22 DIAGNOSIS — N289 Disorder of kidney and ureter, unspecified: Secondary | ICD-10-CM

## 2013-09-22 DIAGNOSIS — E1129 Type 2 diabetes mellitus with other diabetic kidney complication: Secondary | ICD-10-CM

## 2013-09-22 MED ORDER — CANAGLIFLOZIN 100 MG PO TABS: 100.0000 mg | ORAL_TABLET | Freq: Every day | ORAL | Status: DC

## 2013-09-22 NOTE — Telephone Encounter (Signed)
Please call in the invokana rx to his pharmacy

## 2013-09-22 NOTE — Progress Notes (Signed)
Patient ID: Noah Barnes, male   DOB: 07/04/1945, 68 y.o.   MRN: [id number]    Reason for Appointment : for Type 2 Diabetes  History of Present Illness          Diagnosis: Type 2 diabetes mellitus, date of diagnosis: 2008       Past history: His diabetes was borderline in the beginning and not clear what medications he was started on. Has been taking metformin for several years and this was later changed to [place]. Amaryl probably started in 5/14 when blood sugars were higher, initially was given 1 mg and now has been taking 2 mg. His A1c had increased on his  visit in 9/14 and he was referred here for further management  Recent history:  He had a relatively high A1c, difficulty losing weight and a glucose of 202 on his initial consultation in 06/2013 Since then he has been on Invokana in addition to his Janumet and Amaryl With this his blood sugars are looking excellent and A1c is now back to normal Only a sporadically may have relatively higher reading in the morning Glucose is controlled despite reducing his Amaryl to 1 mg in the evening only He has been checking his blood sugar periodically after various meals and these are excellent also No hypoglycemia       Oral hypoglycemic drugs the patient is taking are: Janumet 50/500 twice a day, Invokana 300 mg and Amaryl 1mg  in pm     Side effects from medications have been:  none  Glucose monitoring:  done 1.4 times a day         Glucometer: One Touch   Blood Glucose readings recently from monitor download:  PREMEAL Breakfast  PCP   PCL    PCS  Overall  Glucose range:  90-140   106-138   34-114   97-141   Mean/median:  106     122   108    Hypoglycemia: None      Glycemic control:  Lab Results  Component Value Date   HGBA1C 6.3 09/18/2013   HGBA1C 7.1* 06/19/2013   HGBA1C 7.2* 03/25/2013   Lab Results  Component Value Date   MICROALBUR 1.2 09/18/2013   LDLCALC 122* 09/18/2013   CREATININE 1.6* 09/18/2013    Self-care: The  diet that the patient has been following has improved since he saw the nutritionist and his wife is cooking better also Exercise:  walking 3/7 days a week at [place] consultations: 08/2013              Retinal exam: Most recent: 2013  Weight history: No recent weight loss  Wt Readings from Last 3 Encounters:  09/22/13 228 lb 12.8 oz (103.783 kg)  08/20/13 227 lb 4.8 oz (103.103 kg)  07/25/13 229 lb 3.2 oz (103.964 kg)      Medication List       This list is accurate as of: 09/22/13  8:15 AM.  Always use your most recent med list.               allopurinol 300 MG tablet  Commonly known as:  ZYLOPRIM  TAKE ONE TABLET BY MOUTH EVERY DAY     Canagliflozin 300 MG Tabs  Take 1 tablet (300 mg total) by mouth daily before breakfast.     carvedilol 12.5 MG tablet  Commonly known as:  COREG  TAKE ONE-HALF TABLET BY MOUTH TWICE DAILY AND INCREASE TO ONE TABLET BY MOUTH  TWICE A DAY IF BLOOD PRESSURE > 135/85     cloNIDine 0.1 MG tablet  Commonly known as:  CATAPRES  Take 1 tablet (0.1 mg total) by mouth 2 (two) times daily.     glimepiride 2 MG tablet  Commonly known as:  AMARYL  TAKE ONE-HALF TABLET BY MOUTH EVERY DAY     JANUMET 50-500 MG per tablet  Generic drug:  sitaGLIPtin-metformin  TAKE ONE TABLET BY MOUTH TWICE DAILY WITH  2  LARGEST  MEALS     losartan 100 MG tablet  Commonly known as:  COZAAR  TAKE ONE TABLET BY MOUTH EVERY DAY     ONE TOUCH LANCETS Misc  TEST AS DIRECTED.  DX:250.00     ONE TOUCH ULTRA TEST test strip  Generic drug:  glucose blood  TEST as directed     pravastatin 40 MG tablet  Commonly known as:  PRAVACHOL  1 by mouth daily     sildenafil 100 MG tablet  Commonly known as:  VIAGRA  take by mouth as directed DO NOT take with NITROGLYCERIN     verapamil 240 MG CR tablet  Commonly known as:  CALAN-SR  TAKE ONE-HALF TABLET BY MOUTH TWICE DAILY        Allergies: No Known Allergies  Past Medical History  Diagnosis Date  .  Hypertension   . Diabetes mellitus   . Hyperlipidemia   . Gout   . ED (erectile dysfunction)     Past Surgical History  Procedure Laterality Date  . Colonoscopy  1998    negative  . Knee effusion tapped      Family History  Problem Relation Age of Onset  . Heart attack Mother 61  . Prostate cancer Father   . Diabetes Father   . Cancer Brother     stomach cancer  . Stroke Neg Hx   . Heart disease Neg Hx     Social History:  reports that he quit smoking about 22 years ago. He has never used smokeless tobacco. He reports that he drinks about 1.2 ounces of alcohol per week. He reports that he does not use illicit drugs.    Review of Systems       Lipids: Has been on pravastatin for hyperlipidemia, baseline LDL? 162; labs as follows  Lab Results  Component Value Date   CHOL 189 09/18/2013   HDL 45.50 09/18/2013   LDLCALC 122* 09/18/2013   LDLDIRECT 161.8 05/09/2007   TRIG 107.0 09/18/2013   CHOLHDL 4 09/18/2013       The blood pressure has been high for several years and usually well controlled. He does monitor his blood pressure periodically at home: 140/70. No lightheadedness with starting Invokana  No SVT    LABS:  Appointment on 09/18/2013  Component Date Value Ref Range Status  . Hemoglobin A1C 09/18/2013 6.3  4.6 - 6.5 % Final   Glycemic Control Guidelines for People with Diabetes:Non Diabetic:  <6%Goal of Therapy: <7%Additional Action Suggested:  >8%   . Sodium 09/18/2013 137  135 - 145 mEq/L Final  . Potassium 09/18/2013 4.4  3.5 - 5.1 mEq/L Final  . Chloride 09/18/2013 111  96 - 112 mEq/L Final  . CO2 09/18/2013 18* 19 - 32 mEq/L Final  . Glucose, Bld 09/18/2013 141* 70 - 99 mg/dL Final  . BUN 09/18/2013 23  6 - 23 mg/dL Final  . Creatinine, Ser 09/18/2013 1.6* 0.4 - 1.5 mg/dL Final  . Total Bilirubin 09/18/2013 0.6  0.3 -  1.2 mg/dL Final  . Alkaline Phosphatase 09/18/2013 60  39 - 117 U/L Final  . AST 09/18/2013 14  0 - 37 U/L Final  . ALT 09/18/2013 28   0 - 53 U/L Final  . Total Protein 09/18/2013 7.1  6.0 - 8.3 g/dL Final  . Albumin 09/18/2013 4.0  3.5 - 5.2 g/dL Final  . Calcium 09/18/2013 9.5  8.4 - 10.5 mg/dL Final  . GFR 09/18/2013 54.59* >60.00 mL/min Final  . Cholesterol 09/18/2013 189  0 - 200 mg/dL Final   ATP III Classification       Desirable:  < 200 mg/dL               Borderline High:  200 - 239 mg/dL          High:  > = 240 mg/dL  . Triglycerides 09/18/2013 107.0  0.0 - 149.0 mg/dL Final   Normal:  <150 mg/dLBorderline High:  150 - 199 mg/dL  . HDL 09/18/2013 45.50  >39.00 mg/dL Final  . VLDL 09/18/2013 21.4  0.0 - 40.0 mg/dL Final  . LDL Cholesterol 09/18/2013 122* 0 - 99 mg/dL Final  . Total CHOL/HDL Ratio 09/18/2013 4   Final                  Men          Women1/2 Average Risk     3.4          3.3Average Risk          5.0          4.42X Average Risk          9.6          7.13X Average Risk          15.0          11.0                      . Microalb, Ur 09/18/2013 1.2  0.0 - 1.9 mg/dL Final  . Creatinine,U 09/18/2013 118.3   Final  . Microalb Creat Ratio 09/18/2013 1.0  0.0 - 30.0 mg/g Final  . Color, Urine 09/18/2013 YELLOW  Yellow;Lt. Yellow Final  . APPearance 09/18/2013 CLEAR  Clear Final  . Specific Gravity, Urine 09/18/2013 1.025  1.000-1.030 Final  . pH 09/18/2013 5.5  5.0 - 8.0 Final  . Total Protein, Urine 09/18/2013 NEGATIVE  Negative Final  . Urine Glucose 09/18/2013 >=1000* Negative Final  . Ketones, ur 09/18/2013 NEGATIVE  Negative Final  . Bilirubin Urine 09/18/2013 NEGATIVE  Negative Final  . Hgb urine dipstick 09/18/2013 NEGATIVE  Negative Final  . Urobilinogen, UA 09/18/2013 0.2  0.0 - 1.0 Final  . Leukocytes, UA 09/18/2013 NEGATIVE  Negative Final  . Nitrite 09/18/2013 NEGATIVE  Negative Final  . WBC, UA 09/18/2013 0-2/hpf  0-2/hpf Final  . RBC / HPF 09/18/2013 none seen  0-2/hpf Final  . Squamous Epithelial / LPF 09/18/2013 Rare(0-4/hpf)  Rare(0-4/hpf) Final    Physical Examination:  BP 124/74   Pulse 65  Temp(Src) 98 F (36.7 C)  Resp 16  Ht 5\' 8"  (1.727 m)  Wt 228 lb 12.8 oz (103.783 kg)  BMI 34.80 kg/m2  SpO2 94%   Repeat blood pressure: 118/72 standing No pedal edema  ASSESSMENT/PLAN:   Diabetes type 2, uncontrolled with obesity, BMI 35     His blood sugars are significantly better with adding Invokana and are averaging 110 at home some readings at all  different times He is also taking less Amaryl  However since his fasting readings can be as high as 140 will continue low-dose Invokana for now He does need to continue efforts to lose weight with improved diet and follow instructions given by nutritionist He is quite compliant with exercise He will followup in 3 months  Hypertension: Well controlled  Renal dysfunction: His creatinine is relatively higher at 1.6, likely from lower blood pressure readings and mild volume depletion with using Invokana. Has no proteinuria or abnormal urine sediment Will stop his losartan for now and reduce his Invokana to 100 mg daily Advised him to followup with PCP for rechecking renal function and blood pressure in about a month  Patrici Minnis 09/22/2013, 8:15 AM

## 2013-09-22 NOTE — Patient Instructions (Addendum)
Leave off Losartan  Invokana 100mg  daily  Follow up with Dr Linna Darner within 1 month

## 2013-09-22 NOTE — Telephone Encounter (Signed)
rx sent in this morning

## 2013-11-12 ENCOUNTER — Other Ambulatory Visit (INDEPENDENT_AMBULATORY_CARE_PROVIDER_SITE_OTHER): Payer: Medicare Other

## 2013-11-12 ENCOUNTER — Ambulatory Visit (INDEPENDENT_AMBULATORY_CARE_PROVIDER_SITE_OTHER): Payer: Medicare Other | Admitting: Internal Medicine

## 2013-11-12 ENCOUNTER — Encounter: Payer: Self-pay | Admitting: Internal Medicine

## 2013-11-12 VITALS — BP 140/86 | HR 66 | Temp 98.1°F | Resp 14 | Wt 217.6 lb

## 2013-11-12 DIAGNOSIS — Z0000 Encounter for general adult medical examination without abnormal findings: Secondary | ICD-10-CM

## 2013-11-12 DIAGNOSIS — E1165 Type 2 diabetes mellitus with hyperglycemia: Secondary | ICD-10-CM

## 2013-11-12 DIAGNOSIS — I10 Essential (primary) hypertension: Secondary | ICD-10-CM

## 2013-11-12 DIAGNOSIS — N289 Disorder of kidney and ureter, unspecified: Secondary | ICD-10-CM

## 2013-11-12 DIAGNOSIS — N179 Acute kidney failure, unspecified: Secondary | ICD-10-CM | POA: Insufficient documentation

## 2013-11-12 DIAGNOSIS — K589 Irritable bowel syndrome without diarrhea: Secondary | ICD-10-CM

## 2013-11-12 DIAGNOSIS — E782 Mixed hyperlipidemia: Secondary | ICD-10-CM

## 2013-11-12 DIAGNOSIS — E1129 Type 2 diabetes mellitus with other diabetic kidney complication: Secondary | ICD-10-CM

## 2013-11-12 LAB — BASIC METABOLIC PANEL
BUN: 20 mg/dL (ref 6–23)
CALCIUM: 9.6 mg/dL (ref 8.4–10.5)
CO2: 21 mEq/L (ref 19–32)
Chloride: 111 mEq/L (ref 96–112)
Creatinine, Ser: 1.3 mg/dL (ref 0.4–1.5)
GFR: 71.48 mL/min (ref >60.00)
Glucose, Bld: 115 mg/dL — ABNORMAL HIGH (ref 70–99)
Potassium: 4.2 mEq/L (ref 3.5–5.1)
Sodium: 139 mEq/L (ref 135–145)

## 2013-11-12 LAB — TSH: TSH: 1.1 u[IU]/mL (ref 0.35–4.50)

## 2013-11-12 MED ORDER — ATORVASTATIN CALCIUM 20 MG PO TABS: 20.0000 mg | ORAL_TABLET | Freq: Every day | ORAL | Status: DC

## 2013-11-12 NOTE — Assessment & Plan Note (Addendum)
TSH Probiotic trial

## 2013-11-12 NOTE — Progress Notes (Signed)
Subjective:    Patient ID: Noah Barnes, male    DOB: [birth year], 68 y.o.   MRN: [id number]  HPI [facility] Medicare Wellness Visit: Psychosocial and medical history were reviewed as required by Medicare (history related to abuse, antisocial behavior , firearm risk). Social history: Caffeine:minimal  , Alcohol: < 2/week  , Tobacco FXT:KWIO 1993 Exercise: walking 3x/ week 45 min  Diet: health healthy with 17 # loss Personal safety/fall risk:no Limitations of activities of daily living:no Seatbelt/ smoke alarm use:yes Healthcare Power of Attorney/Living Will status: ? UTD as personal letter Ophthalmologic exam status:due Hearing evaluation status:not UTD Orientation: Oriented X 3 Memory and recall: good Spelling  testing: good Depression/anxiety assessment: no Foreign travel history:never Immunization status for influenza/pneumonia/ shingles /tetanus:  Shingles needed Transfusion history:no Preventive health care maintenance status: Colonoscopy as per protocol/standard care: Last colonoscopy 1998; [place] reviewed. Dental care: overdue by 4 years Chart reviewed and updated. Active issues reviewed and addressed as documented below.    Review of Systems   His angiotensin receptor blocker was discontinued 09/22/13 by Dr. Dwyane Dee. On 3/19 his creatinine was only 1.6 and GFR 54.59. BP off ARB < 140/90 . He had been compliant with CCB ; no adverse med effects.  Also at that time LDL was 122 on 40 mg of pravastatin. He has been compliant with  Pravastatin.  Constipation ; he questions need for Linzess. Miralax ineffective.       Objective:   Physical Exam    Gen.: Healthy and well-nourished in appearance.Central weight excess. Alert, appropriate and cooperative throughout exam. Appears younger than stated age  Head: Normocephalic without obvious abnormalities; no alopecia  Eyes: No corneal or conjunctival inflammation noted. Pupils equal round reactive to light and accommodation.  Extraocular motion intact.  Ears: External  ear exam reveals no significant lesions or deformities. Canals clear .TMs normal. Hearing is grossly normal bilaterally. Nose: External nasal exam reveals no deformity or inflammation. Nasal mucosa are pink and moist. No lesions or exudates noted.   Mouth: Oral mucosa and oropharynx reveal no lesions or exudates. Caries present. Neck: No deformities, masses, or tenderness noted. Range of motion &Thyroid normal Lungs: Normal respiratory effort; chest expands symmetrically. Lungs are clear to auscultation without rales, wheezes, or increased work of breathing. Heart: Normal rate .Slightly irregular rhythm  . Normal S1 and S2. No gallop, click, or rub. No murmur. Abdomen: Protuberant abdomen; ventral hernia.Bowel sounds normal; abdomen soft and nontender. No masses, organomegaly  noted. Genitalia: Genitalia normal except for left varices. Prostate is normal without enlargement, asymmetry, nodularity, or induration                                    Musculoskeletal/extremities: No deformity or scoliosis noted of  the thoracic or lumbar spine.  No clubbing, cyanosis, edema, or significant extremity  deformity noted. Range of motion normal .Tone & strength normal. Hand joints normal  Fingernail health good. Able to lie down & sit up w/o help. Negative SLR bilaterally Vascular: Carotid, radial artery, dorsalis pedis and  posterior tibial pulses are full and equal. No bruits present. Neurologic: Alert and oriented x3. Deep tendon reflexes symmetrical and normal.  Gait normal  Skin: Intact without suspicious lesions or rashes. Lymph: No cervical, axillary, or inguinal lymphadenopathy present. Psych: Mood and affect are normal. Normally interactive  Assessment & Plan:  #1 Medicare Wellness Exam; criteria met ; data entered See Current Assessment & Plan in Problem  List under specific DiagnosisThe labs will be reviewed and risks and options assessed. Written recommendations will be provided by mail or directly through My Chart.Further evaluation or change in medical therapy will be directed by those results.

## 2013-11-12 NOTE — Assessment & Plan Note (Addendum)
I'll request that all DM medications be prescribed by Dr Dwyane Dee

## 2013-11-12 NOTE — Progress Notes (Signed)
Pre visit review using our clinic review tool, if applicable. No additional management support is needed unless otherwise documented below in the visit note. 

## 2013-11-12 NOTE — Patient Instructions (Signed)
Your next office appointment will be determined based upon review of your pending labs . Those instructions will be transmitted to you through My Chart   Please take a probiotic , Florastor OR Align, every day until the bowels are normal. This will replace the normal bacteria which  are necessary for formation of normal stool and processing of food.

## 2013-11-12 NOTE — Assessment & Plan Note (Signed)
Recheck BMET 

## 2013-11-12 NOTE — Assessment & Plan Note (Signed)
Trial of Atorvastatin 20 mg Repeat labs 10 weeks

## 2013-11-20 ENCOUNTER — Other Ambulatory Visit: Payer: Self-pay | Admitting: *Deleted

## 2013-11-20 MED ORDER — VERAPAMIL HCL ER 240 MG PO TBCR: EXTENDED_RELEASE_TABLET | ORAL | Status: DC

## 2013-11-28 ENCOUNTER — Encounter: Payer: Self-pay | Admitting: Internal Medicine

## 2013-12-04 ENCOUNTER — Other Ambulatory Visit: Payer: Self-pay

## 2013-12-04 MED ORDER — LOSARTAN POTASSIUM 100 MG PO TABS: 100.0000 mg | ORAL_TABLET | Freq: Every day | ORAL | Status: DC

## 2013-12-04 NOTE — Telephone Encounter (Signed)
#  90 , RX 3 

## 2013-12-04 NOTE — Telephone Encounter (Signed)
Received a fax from [facility] for a refill on Losartan 100 mg #90. I do not see this on patient's current medication list. Please advise.

## 2013-12-04 NOTE — Telephone Encounter (Signed)
PT CAME INTO OFFICE TO CHECK ON STATUS OF REFILL FOR MEDICATION: LOSARTAN. PLEASE CONTACT PT ON THE STATUS OF THIS REQUEST AT [phone]

## 2013-12-08 ENCOUNTER — Other Ambulatory Visit: Payer: Self-pay

## 2013-12-08 MED ORDER — CARVEDILOL 12.5 MG PO TABS: ORAL_TABLET | ORAL | Status: DC

## 2013-12-08 MED ORDER — ALLOPURINOL 300 MG PO TABS: ORAL_TABLET | ORAL | Status: DC

## 2013-12-19 ENCOUNTER — Other Ambulatory Visit (INDEPENDENT_AMBULATORY_CARE_PROVIDER_SITE_OTHER): Payer: Medicare Other

## 2013-12-19 DIAGNOSIS — E1165 Type 2 diabetes mellitus with hyperglycemia: Principal | ICD-10-CM

## 2013-12-19 DIAGNOSIS — E1129 Type 2 diabetes mellitus with other diabetic kidney complication: Secondary | ICD-10-CM

## 2013-12-19 LAB — COMPREHENSIVE METABOLIC PANEL
ALT: 22 U/L (ref 0–53)
AST: 16 U/L (ref 0–37)
Albumin: 3.8 g/dL (ref 3.5–5.2)
Alkaline Phosphatase: 55 U/L (ref 39–117)
BUN: 25 mg/dL — ABNORMAL HIGH (ref 6–23)
CO2: 22 mEq/L (ref 19–32)
Calcium: 9.4 mg/dL (ref 8.4–10.5)
Chloride: 110 mEq/L (ref 96–112)
Creatinine, Ser: 1.4 mg/dL (ref 0.4–1.5)
GFR: 66.66 mL/min (ref >60.00)
Glucose, Bld: 121 mg/dL — ABNORMAL HIGH (ref 70–99)
Potassium: 4.5 mEq/L (ref 3.5–5.1)
Sodium: 138 mEq/L (ref 135–145)
Total Bilirubin: 1.1 mg/dL (ref 0.2–1.2)
Total Protein: 6.9 g/dL (ref 6.0–8.3)

## 2013-12-19 LAB — HEMOGLOBIN A1C: Hgb A1c MFr Bld: 6.3 % (ref 4.6–6.5)

## 2013-12-22 ENCOUNTER — Telehealth: Payer: Self-pay | Admitting: Internal Medicine

## 2013-12-22 ENCOUNTER — Encounter: Payer: Self-pay | Admitting: Endocrinology

## 2013-12-22 ENCOUNTER — Ambulatory Visit (INDEPENDENT_AMBULATORY_CARE_PROVIDER_SITE_OTHER): Payer: Medicare Other | Admitting: Endocrinology

## 2013-12-22 VITALS — BP 122/78 | HR 57 | Temp 98.0°F | Resp 16 | Ht 68.0 in | Wt 221.2 lb

## 2013-12-22 DIAGNOSIS — E1129 Type 2 diabetes mellitus with other diabetic kidney complication: Secondary | ICD-10-CM

## 2013-12-22 DIAGNOSIS — E1165 Type 2 diabetes mellitus with hyperglycemia: Principal | ICD-10-CM

## 2013-12-22 DIAGNOSIS — E782 Mixed hyperlipidemia: Secondary | ICD-10-CM

## 2013-12-22 DIAGNOSIS — I10 Essential (primary) hypertension: Secondary | ICD-10-CM

## 2013-12-22 MED ORDER — CLONIDINE HCL 0.1 MG PO TABS: 0.1000 mg | ORAL_TABLET | Freq: Two times a day (BID) | ORAL | Status: DC

## 2013-12-22 MED ORDER — CANAGLIFLOZIN 100 MG PO TABS: 100.0000 mg | ORAL_TABLET | Freq: Every day | ORAL | Status: DC

## 2013-12-22 NOTE — Telephone Encounter (Signed)
Notified pt clonidine has been sent to [facility]...Johny Chess

## 2013-12-22 NOTE — Telephone Encounter (Signed)
   If I receive notification a refill as needed; I will address it. Refill request may be going to the old office. Under no circumstances would I ignore refill requests that I receive.  Refill 3 months with one additional refill.

## 2013-12-22 NOTE — Patient Instructions (Signed)
Stop Glimeperide  Please check blood sugars at least half the time about 2 hours after any meal and times per week on waking up. Please bring blood sugar monitor to each visit

## 2013-12-22 NOTE — Progress Notes (Addendum)
Patient ID: Noah Barnes, male   DOB: 02-13-1946, 68 y.o.   MRN: [id number]    Reason for Appointment : F/u for Type 2 Diabetes  History of Present Illness          Diagnosis: Type 2 diabetes mellitus, date of diagnosis: 2008       Past history: His diabetes was borderline in the beginning and not clear what medications he was started on. Has been taking metformin for several years and this was later changed to [place]. Amaryl probably started in 5/14 when blood sugars were higher, initially was given 1 mg and now has been taking 2 mg. His A1c had increased on his  visit in 9/14 and he was referred here for further management  Recent history:    He had a relatively high A1c, difficulty losing weight and a glucose of 202 on his initial consultation in 06/2013 Since then he has been on Invokana in addition to his Janumet and Amaryl Invokana was reduced to 100 mg because of mild increase in creatinine With this his blood sugars are again looking excellent and A1c is consistently around 6.3 Is overall average blood sugar at home is also about the same Only a sporadically may have relatively higher reading  He had an asymptomatic glucose of 62 also and is taking 1 mg Amaryl now He has been checking his blood sugar periodically at bedtime but not 2 hours after meals No symptomatic hypoglycemia       Oral hypoglycemic drugs the patient is taking are: Janumet 50/500 twice a day, Invokana 100 mg and Amaryl 1mg  in pm     Side effects from medications have been:  none  Glucose monitoring:  done 1.4 times a day         Glucometer: One Touch   Blood Glucose readings recently from monitor download:  PREMEAL Breakfast  PCP   PCL    PCS  Overall  Glucose range:  90-140   106-138   84-114   97-141   Mean/median:  106     122   108    Hypoglycemia: 62 reading one time without symptoms    Glycemic control:  Lab Results  Component Value Date   HGBA1C 6.3 12/19/2013   HGBA1C 6.3 09/18/2013   HGBA1C 7.1* 06/19/2013   Lab Results  Component Value Date   MICROALBUR 1.2 09/18/2013   LDLCALC 122* 09/18/2013   CREATININE 1.4 12/19/2013    Self-care: The diet that the patient has been following has improved since he saw the nutritionist and his wife is cooking better also, more vegs Exercise:  walking 3/7 days a week at [place] consultations: 08/2013              Retinal exam: Most recent: 2013  Weight history: No recent weight loss  Wt Readings from Last 3 Encounters:  12/22/13 221 lb 3.2 oz (100.336 kg)  11/12/13 217 lb 9.6 oz (98.703 kg)  09/22/13 228 lb 12.8 oz (103.783 kg)      Medication List       This list is accurate as of: 12/22/13  8:15 AM.  Always use your most recent med list.               allopurinol 300 MG tablet  Commonly known as:  ZYLOPRIM  TAKE ONE TABLET BY MOUTH EVERY DAY     atorvastatin 20 MG tablet  Commonly known as:  LIPITOR  Take 1 tablet (  20 mg total) by mouth daily.     Canagliflozin 100 MG Tabs  Commonly known as:  INVOKANA  Take 1 tablet (100 mg total) by mouth daily before breakfast.     carvedilol 12.5 MG tablet  Commonly known as:  COREG  TAKE ONE-HALF TABLET BY MOUTH TWICE DAILY AND INCREASE TO ONE TABLET BY MOUTH TWICE A DAY IF BLOOD PRESSURE > 135/85     cloNIDine 0.1 MG tablet  Commonly known as:  CATAPRES  Take 1 tablet (0.1 mg total) by mouth 2 (two) times daily.     glimepiride 2 MG tablet  Commonly known as:  AMARYL  TAKE ONE-HALF TABLET BY MOUTH EVERY DAY     JANUMET 50-500 MG per tablet  Generic drug:  sitaGLIPtin-metformin  TAKE ONE TABLET BY MOUTH TWICE DAILY WITH  2  LARGEST  MEALS     losartan 100 MG tablet  Commonly known as:  COZAAR  Take 1 tablet (100 mg total) by mouth daily.     ONE TOUCH LANCETS Misc  TEST AS DIRECTED.  DX:250.00     ONE TOUCH ULTRA TEST test strip  Generic drug:  glucose blood  TEST as directed     sildenafil 100 MG tablet  Commonly known as:  VIAGRA  take by  mouth as directed DO NOT take with NITROGLYCERIN     verapamil 240 MG CR tablet  Commonly known as:  CALAN-SR  TAKE ONE-HALF TABLET BY MOUTH TWICE DAILY        Allergies: No Known Allergies  Past Medical History  Diagnosis Date  . Hypertension   . Diabetes mellitus   . Hyperlipidemia   . Gout   . ED (erectile dysfunction)     Past Surgical History  Procedure Laterality Date  . Colonoscopy  1998    negative; no F/U  . Knee effusion tapped      Family History  Problem Relation Age of Onset  . Heart attack Mother 35  . Prostate cancer Father 49  . Diabetes Father   . Cancer Brother     stomach cancer  . Stroke Neg Hx     Social History:  reports that he quit smoking about 22 years ago. He has never used smokeless tobacco. He reports that he drinks about 1.2 ounces of alcohol per week. He reports that he does not use illicit drugs.    Review of Systems       Lipids: Has been on pravastatin for hyperlipidemia, baseline LDL? 162; labs as follows  Lab Results  Component Value Date   CHOL 189 09/18/2013   HDL 45.50 09/18/2013   LDLCALC 122* 09/18/2013   LDLDIRECT 161.8 05/09/2007   TRIG 107.0 09/18/2013   CHOLHDL 4 09/18/2013       The blood pressure has been high for several years and usually well controlled. He does monitor his blood pressure periodically at home: 120+/70. No lightheadedness with starting Invokana  No SVT    LABS:  Appointment on 12/19/2013  Component Date Value Ref Range Status  . Hemoglobin A1C 12/19/2013 6.3  4.6 - 6.5 % Final   Glycemic Control Guidelines for People with Diabetes:Non Diabetic:  <6%Goal of Therapy: <7%Additional Action Suggested:  >8%   . Sodium 12/19/2013 138  135 - 145 mEq/L Final  . Potassium 12/19/2013 4.5  3.5 - 5.1 mEq/L Final  . Chloride 12/19/2013 110  96 - 112 mEq/L Final  . CO2 12/19/2013 22  19 - 32 mEq/L Final  .  Glucose, Bld 12/19/2013 121* 70 - 99 mg/dL Final  . BUN 12/19/2013 25* 6 - 23 mg/dL Final  .  Creatinine, Ser 12/19/2013 1.4  0.4 - 1.5 mg/dL Final  . Total Bilirubin 12/19/2013 1.1  0.2 - 1.2 mg/dL Final  . Alkaline Phosphatase 12/19/2013 55  39 - 117 U/L Final  . AST 12/19/2013 16  0 - 37 U/L Final  . ALT 12/19/2013 22  0 - 53 U/L Final  . Total Protein 12/19/2013 6.9  6.0 - 8.3 g/dL Final  . Albumin 12/19/2013 3.8  3.5 - 5.2 g/dL Final  . Calcium 12/19/2013 9.4  8.4 - 10.5 mg/dL Final  . GFR 12/19/2013 66.66  >60.00 mL/min Final    Physical Examination:  BP 122/78  Pulse 57  Temp(Src) 98 F (36.7 C)  Resp 16  Ht 5\' 8"  (1.727 m)  Wt 221 lb 3.2 oz (100.336 kg)  BMI 33.64 kg/m2  SpO2 94%   Repeat blood pressure: 118/72 standing No pedal edema  ASSESSMENT/PLAN:   Diabetes type 2, uncontrolled with obesity, BMI 35     His blood sugars are again excellent  with adding Invokana and are averaging 106  Currently checking blood sugars fasting and bedtime mostly He is also taking only 1 mg Amaryl and has had readings as low as 62 Had lost weight and is hopefully more insulin sensitive Will try to have him leave off the Amaryl and call if blood sugars are significantly higher  He is quite compliant with exercise He will followup in 4 months  Hypertension: Well controlled currently, back on losartan from PCP  Hyperlipidemia: Has started on Lipitor last month, followup to be done by PCP   Renal dysfunction: His creatinine is relatively 1.4 This is despite his restarting his losartan and may have been possibly related to using the higher dose of Invokana However we'll need to continue watching the creatinine in consider reducing the dose of losartan and needed Has no proteinuria or abnormal urine sediment Advised him to followup with PCP for rechecking renal function periodically  KUMAR,AJAY 12/22/2013, 8:15 AM

## 2013-12-22 NOTE — Telephone Encounter (Signed)
Patient is trying to get a refill on clonidine.  Patient is out.  Pharmacy gave him enough for possibly two days.  Pharmacy states that they have sent numerous request.  Patient states this is the second time this has happened to him and he believes that since Dr. Linna Darner moved over here he is not concerned about med refills.  Ph number to reach patient is [phone].

## 2013-12-23 ENCOUNTER — Telehealth: Payer: Self-pay | Admitting: Internal Medicine

## 2013-12-23 NOTE — Telephone Encounter (Signed)
Relevant patient education assigned to patient using Emmi. ° °

## 2013-12-26 LAB — HM DIABETES EYE EXAM

## 2014-01-16 ENCOUNTER — Encounter: Payer: Self-pay | Admitting: Internal Medicine

## 2014-01-16 ENCOUNTER — Ambulatory Visit (INDEPENDENT_AMBULATORY_CARE_PROVIDER_SITE_OTHER): Payer: Medicare Other | Admitting: Internal Medicine

## 2014-01-16 ENCOUNTER — Telehealth: Payer: Self-pay | Admitting: Internal Medicine

## 2014-01-16 VITALS — BP 120/80 | HR 62 | Temp 98.1°F | Wt 218.6 lb

## 2014-01-16 DIAGNOSIS — K589 Irritable bowel syndrome without diarrhea: Secondary | ICD-10-CM

## 2014-01-16 NOTE — Progress Notes (Signed)
Pre visit review using our clinic review tool, if applicable. No additional management support is needed unless otherwise documented below in the visit note. 

## 2014-01-16 NOTE — Patient Instructions (Signed)
Increase roughage (fruits and vegetables) and your diet as recommended. Drink to thirst up to 40 ounces of water a day. Metamucil or other such agents may help the constipation. Take MiraLax every day until you have a bowel movement. The GI referral will be scheduled and you'll be notified of the time.

## 2014-01-16 NOTE — Telephone Encounter (Signed)
PLEASE DISREGARD **ERROR**

## 2014-01-16 NOTE — Progress Notes (Signed)
   Subjective:    Patient ID: Noah Barnes, male    DOB: 07/08/1945, 68 y.o.   MRN: [id number]  HPI  He has had discomfort in the right flank since 01/13/14 manifested as discomfort when he strains to have a bowel movement.  He has a history of irritable bowel syndrome, constipation dependent. I had recommend a probiotic as I thought his IBS had been associated with  loose stool.  He hasemployed stool softeners with no benefit  His last colonoscopy was in 1998.  He denies other GI symptoms.  He is not on narcotics. TSH was therapeutic at 1.10 on 11/12/13.  On that date rectal exam was normal.    Review of Systems Unexplained weight loss, abdominal pain, significant dyspepsia, dysphagia, melena, rectal bleeding, or persistently small caliber stools are denied.     Objective:   Physical Exam  Significant or distinguishing  findings on physical exam are documented first.  Below that are other systems examined & findings.   There is accentuated curvature of the upper thoracic spine.  Heart rate is somewhat slow.  There is an extremely large ventral hernia  Trace edema is noted at the sock line  General appearance is one of good health and nourishment w/o distress.  Eyes: No conjunctival inflammation or scleral icterus is present.  Oral exam: Dental hygiene is good; lips and gums are healthy appearing.There is no oropharyngeal erythema or exudate noted.   Heart:  regular rhythm. S1 and S2 normal without gallop, murmur, click, rub or other extra sounds     Lungs:Chest clear to auscultation; no wheezes, rhonchi,rales ,or rubs present.No increased work of breathing.   Abdomen: bowel sounds normal, soft and non-tender without masses or organomegaly  noted.  No guarding or rebound . No tenderness over the flanks to percussion  Musculoskeletal: Able to lie flat and sit up without help. Negative straight leg raising bilaterally. Gait normal  Skin:Warm & dry.  Intact without  suspicious lesions or rashes ; no jaundice or tenting  Lymphatic: No lymphadenopathy is noted about the head, neck, axilla.                Assessment & Plan:  #1 constipation predominant irritable bowel syndrome. 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 I am concerned about the localized nature of his symptoms.  Plan: Discontinue Florastor; MiraLax daily; GI referral. ? Linzess after repeat colonoscopy

## 2014-01-16 NOTE — Assessment & Plan Note (Signed)
Miralax GI referral

## 2014-01-19 ENCOUNTER — Other Ambulatory Visit: Payer: Self-pay

## 2014-01-19 MED ORDER — SITAGLIPTIN PHOS-METFORMIN HCL 50-500 MG PO TABS: ORAL_TABLET | ORAL | Status: DC

## 2014-01-22 ENCOUNTER — Ambulatory Visit (INDEPENDENT_AMBULATORY_CARE_PROVIDER_SITE_OTHER): Payer: Medicare Other | Admitting: Internal Medicine

## 2014-01-22 ENCOUNTER — Encounter: Payer: Self-pay | Admitting: Internal Medicine

## 2014-01-22 VITALS — BP 150/90 | HR 56 | Ht 67.0 in | Wt 215.2 lb

## 2014-01-22 DIAGNOSIS — K5900 Constipation, unspecified: Secondary | ICD-10-CM

## 2014-01-22 DIAGNOSIS — Z800 Family history of malignant neoplasm of digestive organs: Secondary | ICD-10-CM

## 2014-01-22 MED ORDER — PEG-KCL-NACL-NASULF-NA ASC-C 100 G PO SOLR: 1.0000 | Freq: Once | ORAL | Status: DC

## 2014-01-22 MED ORDER — LINACLOTIDE 145 MCG PO CAPS: 145.0000 ug | ORAL_CAPSULE | Freq: Every day | ORAL | Status: DC

## 2014-01-22 NOTE — Patient Instructions (Signed)
We have sent the following medications to your pharmacy for you to pick up at your convenience: Linzess 145 mcg one tablet by mouth once daily.   You have been scheduled for a colonoscopy. Please follow written instructions given to you at your visit today.  Please pick up your prep kit at the pharmacy within the next 1-3 days. If you use inhalers (even only as needed), please bring them with you on the day of your procedure. Your physician has requested that you go to www.startemmi.com and enter the access code given to you at your visit today. This web site gives a general overview about your procedure. However, you should still follow specific instructions given to you by our office regarding your preparation for the procedure.  cc: Unice Cobble, MD

## 2014-01-22 NOTE — Progress Notes (Signed)
Patient ID: Noah Barnes, male   DOB: August 21, 1945, 68 y.o.   MRN: [id number] HPI: Noah Barnes is a 68 year old male with a past medical history of IBS with constipation predominance, diabetes, hypertension, hyperlipidemia, gout, and a family history of colon cancer who is seen in consultation at the request of Dr. Linna Darner to evaluate constipation and for colorectal cancer screening. He is here alone today. He was known remotely to Dr. Sharlett Iles and feels like he may have had a colonoscopy greater than 15 years ago. He reports he has long-standing constipation which is worse in the last few years. He reports the feeling of incomplete evacuation and hard stool. He is using stool softener which helps a little bit. He has 1-2 stools a day but he reports these are small volume and hard to pass. He denies rectal bleeding or melena. He denies abdominal pain. He does report occasional abdominal "tightness". No nausea or vomiting. No heartburn. No trouble swallowing. No hepatobiliary complaints. His brother was diagnosed with colorectal cancer at age 33 and died at age 40. He takes allopurinol for gout prevention and is taking Janumet and Invokana for diabetes. He is also on several antihypertensive medications.  Past Medical History  Diagnosis Date  . Hypertension   . Diabetes mellitus   . Hyperlipidemia   . Gout   . ED (erectile dysfunction)   . IBS (irritable bowel syndrome)     Past Surgical History  Procedure Laterality Date  . Colonoscopy  1998    negative; no F/U  . Knee effusion tapped      Outpatient Prescriptions Prior to Visit  Medication Sig Dispense Refill  . allopurinol (ZYLOPRIM) 300 MG tablet TAKE ONE TABLET BY MOUTH EVERY DAY  30 tablet  5  . atorvastatin (LIPITOR) 20 MG tablet Take 1 tablet (20 mg total) by mouth daily.  90 tablet  0  . Canagliflozin (INVOKANA) 100 MG TABS Take 1 tablet (100 mg total) by mouth daily before breakfast.  30 tablet  3  . carvedilol (COREG) 12.5 MG tablet  TAKE ONE-HALF TABLET BY MOUTH TWICE DAILY AND INCREASE TO ONE TABLET BY MOUTH TWICE A DAY IF BLOOD PRESSURE > 135/85  60 tablet  5  . cloNIDine (CATAPRES) 0.1 MG tablet Take 1 tablet (0.1 mg total) by mouth 2 (two) times daily.  180 tablet  1  . losartan (COZAAR) 100 MG tablet Take 1 tablet (100 mg total) by mouth daily.  90 tablet  3  . ONE TOUCH LANCETS MISC TEST AS DIRECTED.  DX:250.00  100 each  12  . ONE TOUCH ULTRA TEST test strip TEST as directed  100 each  3  . sildenafil (VIAGRA) 100 MG tablet take by mouth as directed DO NOT take with NITROGLYCERIN  6 tablet  4  . sitaGLIPtin-metformin (JANUMET) 50-500 MG per tablet TAKE ONE TABLET BY MOUTH TWICE DAILY WITH  2  LARGEST  MEALS  60 tablet  5  . verapamil (CALAN-SR) 240 MG CR tablet TAKE ONE-HALF TABLET BY MOUTH TWICE DAILY  90 tablet  3   No facility-administered medications prior to visit.    No Known Allergies  Family History  Problem Relation Age of Onset  . Heart attack Mother 1  . Prostate cancer Father 8  . Diabetes Father   . Colon cancer Brother   . Stroke Neg Hx   . Irritable bowel syndrome Sister     x 2  . Irritable bowel syndrome Brother   . Irritable  bowel syndrome Father     History  Substance Use Topics  . Smoking status: Former Smoker    Types: Cigarettes    Quit date: 07/04/1991  . Smokeless tobacco: Never Used     Comment: smoked age [age], up to 1/3 ppd  . Alcohol Use: 1.2 oz/week    2 Glasses of wine per week     Comment:  socially    ROS: As per history of present illness, otherwise negative  BP 150/90  Pulse 56  Ht 5\' 7"  (1.702 m)  Wt 215 lb 4 oz (97.637 kg)  BMI 33.71 kg/m2 Constitutional: Well-developed and well-nourished. No distress. HEENT: Normocephalic and atraumatic. Oropharynx is clear and moist. No oropharyngeal exudate. Conjunctivae are normal.  No scleral icterus. Neck: Neck supple. Trachea midline. Cardiovascular: Normal rate, regular rhythm and intact distal pulses. No  M/R/G Pulmonary/chest: Effort normal and breath sounds normal. No wheezing, rales or rhonchi. Abdominal: Soft, obese, nontender, nondistended. Bowel sounds active throughout. Extremities: no clubbing, cyanosis, or edema Lymphadenopathy: No cervical adenopathy noted. Neurological: Alert and oriented to person place and time. Skin: Skin is warm and dry. No rashes noted. Psychiatric: Normal mood and affect. Behavior is normal.  RELEVANT LABS AND IMAGING: CBC    Component Value Date/Time   WBC 4.3* 05/09/2007 0000   RBC 5.11 05/09/2007 0000   HGB 13.9 05/09/2007 0000   HCT 41.8 05/09/2007 0000   PLT 226 05/09/2007 0000   MCV 81.8 05/09/2007 0000   MCHC 33.3 05/09/2007 0000   RDW 13.1 05/09/2007 0000   MONOABS 0.4 05/09/2007 0000   EOSABS 0.1 05/09/2007 0000   BASOSABS 0.0 05/09/2007 0000    CMP     Component Value Date/Time   NA 138 12/19/2013 0833   K 4.5 12/19/2013 0833   CL 110 12/19/2013 0833   CO2 22 12/19/2013 0833   GLUCOSE 121* 12/19/2013 0833   BUN 25* 12/19/2013 0833   CREATININE 1.4 12/19/2013 0833   CALCIUM 9.4 12/19/2013 0833   PROT 6.9 12/19/2013 0833   ALBUMIN 3.8 12/19/2013 0833   AST 16 12/19/2013 0833   ALT 22 12/19/2013 0833   ALKPHOS 55 12/19/2013 0833   BILITOT 1.1 12/19/2013 0833   GFRNONAA 66 05/09/2007 0000   GFRAA 79 05/09/2007 0000    ASSESSMENT/PLAN: 68 year old male with a past medical history of IBS with constipation predominance, diabetes, hypertension, hyperlipidemia, gout, and a family history of colon cancer who is seen in consultation at the request of Dr. Linna Darner to evaluate constipation and for colorectal cancer screening.  1. Chronic constipation/CRC screening/family hx of colon cancer -- I recommended colonoscopy for screening and he is elevated risk given his brother's history of colon cancer at age 71. We discussed the test today including the risks and benefits and he is agreeable to proceed. I've also recommended starting him on Linzess 145 mcg daily for  chronic constipation. For this will help with more frequent bowel movements and also more complete bowel movements. He discussed the main side effect of diarrhea, and asked that he notify me should this occur. She voices understanding. Further recommendations based on how he responds to [place] and after colonoscopy

## 2014-01-26 ENCOUNTER — Encounter: Payer: Self-pay | Admitting: Internal Medicine

## 2014-03-03 ENCOUNTER — Ambulatory Visit (AMBULATORY_SURGERY_CENTER): Payer: Medicare Other | Admitting: Internal Medicine

## 2014-03-03 ENCOUNTER — Encounter: Payer: Self-pay | Admitting: Internal Medicine

## 2014-03-03 VITALS — BP 117/75 | HR 63 | Temp 98.7°F | Resp 16 | Ht 67.0 in | Wt 215.0 lb

## 2014-03-03 DIAGNOSIS — D126 Benign neoplasm of colon, unspecified: Secondary | ICD-10-CM

## 2014-03-03 DIAGNOSIS — Z1211 Encounter for screening for malignant neoplasm of colon: Secondary | ICD-10-CM

## 2014-03-03 DIAGNOSIS — Z800 Family history of malignant neoplasm of digestive organs: Secondary | ICD-10-CM

## 2014-03-03 DIAGNOSIS — D124 Benign neoplasm of descending colon: Secondary | ICD-10-CM

## 2014-03-03 MED ORDER — SODIUM CHLORIDE 0.9 % IV SOLN: 500.0000 mL | INTRAVENOUS | Status: DC

## 2014-03-03 NOTE — Op Note (Signed)
[place]  [address] [place], [id number]   COLONOSCOPY PROCEDURE REPORT  PATIENT: Noah Barnes, Noah Barnes  MR#: [id number] BIRTHDATE: 01-31-1946 , 68  yrs. old GENDER: Male ENDOSCOPIST: Jerene Bears, MD REFERRED [id number] Linna Darner, M.D. PROCEDURE DATE:  03/03/2014 PROCEDURE:   Colonoscopy with snare polypectomy First Screening Colonoscopy - Avg.  risk and is 50 yrs.  old or older - No.  Prior Negative Screening - Now for repeat screening. 10 or more years since last screening  History of Adenoma - Now for follow-up colonoscopy & has been > or = to 3 yrs.  N/A  Polyps Removed Today? Yes. ASA CLASS:   Class II INDICATIONS:elevated risk screening and Patient's immediate family history of colon cancer. MEDICATIONS: MAC sedation, administered by CRNA and propofol (Diprivan) 300mg  IV  DESCRIPTION OF PROCEDURE:   After the risks benefits and alternatives of the procedure were thoroughly explained, informed consent was obtained.  A digital rectal exam revealed no rectal mass.   The LB [id number]  endoscope was introduced through the anus and advanced to the cecum, which was identified by both the appendix and ileocecal valve. No adverse events experienced. The quality of the prep was good, using MoviPrep  The instrument was then slowly withdrawn as the colon was fully examined.  COLON FINDINGS: Two sessile polyps measuring 6 and 3 mm in size were found at the hepatic flexure and in the descending colon. Polypectomy was performed using hot snare (1) and using cold snare (1).  All resections were complete and all polyp tissue was completely retrieved.   Moderate diverticulosis was noted in the ascending colon, descending colon, and sigmoid colon.   Mild melanosis was found throughout the entire examined colon. Retroflexed views revealed internal hemorrhoids. The time to cecum=1 minutes 31 seconds.  Withdrawal time=11 minutes 49 seconds. The scope was withdrawn and  the procedure completed. COMPLICATIONS: There were no complications.  ENDOSCOPIC IMPRESSION: 1.   Two sessile polyps measuring 6 and 3 mm in size were found at the hepatic flexure and in the descending colon; Polypectomy was performed using hot snare and using cold snare 2.   Moderate diverticulosis was noted in the ascending colon, descending colon, and sigmoid colon 3.   Mild melanosis was found throughout the entire examined colon  RECOMMENDATIONS: 1.  Hold aspirin, aspirin products, and anti-inflammatory medication for 2 weeks. 2.  Await pathology results 3.  Repeat Colonoscopy in 5 years. 4.  You will receive a letter within 1-2 weeks with the results of your biopsy as well as final recommendations.  Please call my office if you have not received a letter after 3 weeks.  eSigned:  Jerene Bears, MD 03/03/2014 4:16 PM       cc: The Patient and Hendricks Limes, MD

## 2014-03-03 NOTE — Progress Notes (Signed)
Report to PACU, RN, vss, BBS= Clear.  

## 2014-03-03 NOTE — Progress Notes (Signed)
Called to room to assist during endoscopic procedure.  Patient ID and intended procedure confirmed with present staff. Received instructions for my participation in the procedure from the performing physician.  

## 2014-03-03 NOTE — Patient Instructions (Signed)
Impressions/recommendations:  Polyps (handout given) Diverticulosis (handout given) High Fiber diet (handout given)  Hold aspirin, aspirin containing products and anti-inflammatory medications for 2 weeks. May resume 9/16. Tylenol or acetaminophen only. Repeat colonoscopy in 5 years.  YOU HAD AN ENDOSCOPIC PROCEDURE TODAY AT [facility]: Refer to the procedure report that was given to you for any specific questions about what was found during the examination.  If the procedure report does not answer your questions, please call your gastroenterologist to clarify.  If you requested that your care partner not be given the details of your procedure findings, then the procedure report has been included in a sealed envelope for you to review at your convenience later.  YOU SHOULD EXPECT: Some feelings of bloating in the abdomen. Passage of more gas than usual.  Walking can help get rid of the air that was put into your GI tract during the procedure and reduce the bloating. If you had a lower endoscopy (such as a colonoscopy or flexible sigmoidoscopy) you may notice spotting of blood in your stool or on the toilet paper. If you underwent a bowel prep for your procedure, then you may not have a normal bowel movement for a few days.  DIET: Your first meal following the procedure should be a light meal and then it is ok to progress to your normal diet.  A half-sandwich or bowl of soup is an example of a good first meal.  Heavy or fried foods are harder to digest and may make you feel nauseous or bloated.  Likewise meals heavy in dairy and vegetables can cause extra gas to form and this can also increase the bloating.  Drink plenty of fluids but you should avoid alcoholic beverages for 24 hours.  ACTIVITY: Your care partner should take you home directly after the procedure.  You should plan to take it easy, moving slowly for the rest of the day.  You can resume normal activity the day after the  procedure however you should NOT DRIVE or use heavy machinery for 24 hours (because of the sedation medicines used during the test).    SYMPTOMS TO REPORT IMMEDIATELY: A gastroenterologist can be reached at any hour.  During normal business hours, 8:30 AM to 5:00 PM Monday through Friday, call [phone].  After hours and on weekends, please call the GI answering service at [phone] who will take a message and have the physician on call contact you.   Following lower endoscopy (colonoscopy or flexible sigmoidoscopy):  Excessive amounts of blood in the stool  Significant tenderness or worsening of abdominal pains  Swelling of the abdomen that is new, acute  Fever of 100F or higher   FOLLOW UP: If any biopsies were taken you will be contacted by phone or by letter within the next 1-3 weeks.  Call your gastroenterologist if you have not heard about the biopsies in 3 weeks.  Our staff will call the home number listed on your records the next business day following your procedure to check on you and address any questions or concerns that you may have at that time regarding the information given to you following your procedure. This is a courtesy call and so if there is no answer at the home number and we have not heard from you through the emergency physician on call, we will assume that you have returned to your regular daily activities without incident.  SIGNATURES/CONFIDENTIALITY: You and/or your care partner have signed paperwork which  will be entered into your electronic medical record.  These signatures attest to the fact that that the information above on your After Visit Summary has been reviewed and is understood.  Full responsibility of the confidentiality of this discharge information lies with you and/or your care-partner.

## 2014-03-04 ENCOUNTER — Telehealth: Payer: Self-pay

## 2014-03-04 NOTE — Telephone Encounter (Signed)
  Follow up Call-  Call back number 03/03/2014  Post procedure Call Back phone  # [phone]  Permission to leave phone message Yes     Patient questions:  Do you have a fever, pain , or abdominal swelling? No. Pain Score  0 *  Have you tolerated food without any problems? Yes.    Have you been able to return to your normal activities? Yes.    Do you have any questions about your discharge instructions: Diet   No. Medications  No. Follow up visit  No.  Do you have questions or concerns about your Care? No.  Actions: * If pain score is 4 or above: No action needed, pain <4.   No problems per the pt. maw

## 2014-03-13 ENCOUNTER — Encounter: Payer: Self-pay | Admitting: Internal Medicine

## 2014-04-02 ENCOUNTER — Other Ambulatory Visit: Payer: Self-pay | Admitting: Internal Medicine

## 2014-04-06 ENCOUNTER — Telehealth: Payer: Self-pay | Admitting: Internal Medicine

## 2014-04-06 MED ORDER — METOPROLOL TARTRATE 25 MG PO TABS: 25.0000 mg | ORAL_TABLET | Freq: Two times a day (BID) | ORAL | Status: DC

## 2014-04-06 NOTE — Telephone Encounter (Signed)
Pt came by office stating that the pharmacy has been trying to contact our office in reference to a refill request for CARVEDILOL. Pharmacy told pt they did not have the med available in stock at this time. Pt would like alternative med to be sent in the meantime. He does not know the alternatives they have available at the pharmacy. Pt would like to be contacted when the request is complete. Pt states he is completely out of the medication at this time.

## 2014-04-06 NOTE — Telephone Encounter (Signed)
   Please change carvedilol to metoprolol 25 mg twice a day dispense 60, refill x2

## 2014-04-06 NOTE — Telephone Encounter (Signed)
Patient has been advised of new medication. Metoprolol sent to [facility] on [address]

## 2014-04-15 ENCOUNTER — Other Ambulatory Visit: Payer: Self-pay | Admitting: Internal Medicine

## 2014-04-15 ENCOUNTER — Other Ambulatory Visit (INDEPENDENT_AMBULATORY_CARE_PROVIDER_SITE_OTHER): Payer: Medicare Other

## 2014-04-15 DIAGNOSIS — E1165 Type 2 diabetes mellitus with hyperglycemia: Secondary | ICD-10-CM

## 2014-04-15 DIAGNOSIS — E1129 Type 2 diabetes mellitus with other diabetic kidney complication: Secondary | ICD-10-CM

## 2014-04-15 DIAGNOSIS — E782 Mixed hyperlipidemia: Secondary | ICD-10-CM

## 2014-04-15 DIAGNOSIS — IMO0002 Reserved for concepts with insufficient information to code with codable children: Secondary | ICD-10-CM

## 2014-04-15 LAB — COMPREHENSIVE METABOLIC PANEL
ALT: 17 U/L (ref 0–53)
AST: 13 U/L (ref 0–37)
Albumin: 3.5 g/dL (ref 3.5–5.2)
Alkaline Phosphatase: 54 U/L (ref 39–117)
BUN: 22 mg/dL (ref 6–23)
CO2: 18 mEq/L — ABNORMAL LOW (ref 19–32)
Calcium: 9.5 mg/dL (ref 8.4–10.5)
Chloride: 111 mEq/L (ref 96–112)
Creatinine, Ser: 1.3 mg/dL (ref 0.4–1.5)
GFR: 72.69 mL/min (ref >60.00)
GLUCOSE: 116 mg/dL — AB (ref 70–99)
Potassium: 4.3 mEq/L (ref 3.5–5.1)
Sodium: 139 mEq/L (ref 135–145)
Total Bilirubin: 0.9 mg/dL (ref 0.2–1.2)
Total Protein: 7.3 g/dL (ref 6.0–8.3)

## 2014-04-15 LAB — HEMOGLOBIN A1C: Hgb A1c MFr Bld: 6.6 % — ABNORMAL HIGH (ref 4.6–6.5)

## 2014-04-15 LAB — LDL CHOLESTEROL, DIRECT: Direct LDL: 80.1 mg/dL

## 2014-04-20 ENCOUNTER — Ambulatory Visit (INDEPENDENT_AMBULATORY_CARE_PROVIDER_SITE_OTHER): Payer: Medicare Other | Admitting: Endocrinology

## 2014-04-20 ENCOUNTER — Encounter: Payer: Self-pay | Admitting: Endocrinology

## 2014-04-20 VITALS — BP 120/70 | HR 54 | Temp 97.9°F | Resp 16 | Ht 67.0 in | Wt 217.6 lb

## 2014-04-20 DIAGNOSIS — I10 Essential (primary) hypertension: Secondary | ICD-10-CM

## 2014-04-20 DIAGNOSIS — E119 Type 2 diabetes mellitus without complications: Secondary | ICD-10-CM

## 2014-04-20 NOTE — Progress Notes (Signed)
Patient ID: Noah Barnes, male   DOB: Nov 12, 1945, 68 y.o.   MRN: [id number]    Reason for Appointment : F/u for Type 2 Diabetes  History of Present Illness          Diagnosis: Type 2 diabetes mellitus, date of diagnosis: 2008       Past history: His diabetes was borderline in the beginning and not clear what medications he was started on. Has been taking metformin for several years and this was later changed to [place]. Amaryl probably started in 5/14 when blood sugars were higher, initially was given 1 mg and now has been taking 2 mg. He had a relatively high A1c of 7.1, difficulty losing weight and a glucose of 202 on his initial consultation in 06/2013 Since then he has been on Invokana in addition to his Janumet and Amaryl  Recent history:  He is on a regimen of Janumet and Invokana only at this time Invokana was reduced to 100 mg because of mild increase in creatinine His Amaryl was stopped in 6/15 because of tendency to occasional hypoglycemia With this his blood sugars are again looking fairly good at home but he did not bring his monitor for download He does not think his sugars are high after meals also Fasting glucose was near-normal in the lab His A1c is relatively higher but this may reflect less tendency to low normal readings with Amaryl He is usually fairly active and he thinks he is watching his diet However has gained a couple pounds since last visit       Oral hypoglycemic drugs the patient is taking are: Janumet 50/500 twice a day, Invokana 100 mg   Side effects from medications have been:  none  Glucose monitoring:  done 1.4 times a day         Glucometer: One Touch   Blood Glucose readings recently from recall  PREMEAL Breakfast Lunch Dinner Bedtime Overall  Glucose range: 115-120   120-140   Mean/median:        Hypoglycemia: none  Glycemic control:  Lab Results  Component Value Date   HGBA1C 6.6* 04/15/2014   HGBA1C 6.3 12/19/2013   HGBA1C 6.3 09/18/2013    Lab Results  Component Value Date   MICROALBUR 1.2 09/18/2013   LDLCALC 122* 09/18/2013   CREATININE 1.3 04/15/2014    Self-care: The diet that the patient has been following has improved since he saw the nutritionist and his wife is cooking better, more vegetables in diet Exercise:  walking 3/7 days a week at [place] consultations: 08/2013              Retinal exam: Most recent: 2013  Weight history:   Wt Readings from Last 3 Encounters:  04/20/14 217 lb 9.6 oz (98.703 kg)  03/03/14 215 lb (97.523 kg)  01/22/14 215 lb 4 oz (97.637 kg)      Medication List       This list is accurate as of: 04/20/14 10:07 AM.  Always use your most recent med list.               allopurinol 300 MG tablet  Commonly known as:  ZYLOPRIM  TAKE ONE TABLET BY MOUTH EVERY DAY     atorvastatin 20 MG tablet  Commonly known as:  LIPITOR  TAKE ONE TABLET BY MOUTH ONCE DAILY     Canagliflozin 100 MG Tabs  Commonly known as:  INVOKANA  Take 1 tablet (100 mg  total) by mouth daily before breakfast.     cloNIDine 0.1 MG tablet  Commonly known as:  CATAPRES  Take 1 tablet (0.1 mg total) by mouth 2 (two) times daily.     Linaclotide 145 MCG Caps capsule  Commonly known as:  LINZESS  Take 1 capsule (145 mcg total) by mouth daily.     losartan 100 MG tablet  Commonly known as:  COZAAR  Take 1 tablet (100 mg total) by mouth daily.     metoprolol tartrate 25 MG tablet  Commonly known as:  LOPRESSOR  Take 1 tablet (25 mg total) by mouth 2 (two) times daily.     ONE TOUCH ULTRA TEST test strip  Generic drug:  glucose blood  take as directed     ONETOUCH DELICA LANCETS FINE Misc  TEST AS DIRECTED.     sildenafil 100 MG tablet  Commonly known as:  VIAGRA  take by mouth as directed DO NOT take with NITROGLYCERIN     sitaGLIPtin-metformin 50-500 MG per tablet  Commonly known as:  JANUMET  TAKE ONE TABLET BY MOUTH TWICE DAILY WITH  2  LARGEST  MEALS     verapamil 240 MG CR tablet   Commonly known as:  CALAN-SR  TAKE ONE-HALF TABLET BY MOUTH TWICE DAILY        Allergies: No Known Allergies  Past Medical History  Diagnosis Date  . Hypertension   . Diabetes mellitus   . Hyperlipidemia   . Gout   . ED (erectile dysfunction)   . IBS (irritable bowel syndrome)     Past Surgical History  Procedure Laterality Date  . Colonoscopy  1998    negative; no F/U  . Knee effusion tapped      Family History  Problem Relation Age of Onset  . Heart attack Mother 16  . Prostate cancer Father 24  . Diabetes Father   . Colon cancer Brother   . Stroke Neg Hx   . Irritable bowel syndrome Sister     x 2  . Irritable bowel syndrome Brother   . Irritable bowel syndrome Father     Social History:  reports that he quit smoking about 22 years ago. His smoking use included Cigarettes. He smoked 0.00 packs per day. He has never used smokeless tobacco. He reports that he drinks about 1.2 ounces of alcohol per week. He reports that he does not use illicit drugs.    Review of Systems       Lipids: Has been on pravastatin for hyperlipidemia, baseline LDL? 162; labs as follows  Lab Results  Component Value Date   CHOL 189 09/18/2013   HDL 45.50 09/18/2013   LDLCALC 122* 09/18/2013   LDLDIRECT 80.1 04/15/2014   TRIG 107.0 09/18/2013   CHOLHDL 4 09/18/2013       The blood pressure has been high for several years and usually well controlled. He does monitor his blood pressure not regularly periodically at home: 120+/70. No lightheadedness with starting Invokana  No SVT    LABS:  Appointment on 04/15/2014  Component Date Value Ref Range Status  . Hemoglobin A1C 04/15/2014 6.6* 4.6 - 6.5 % Final   Glycemic Control Guidelines for People with Diabetes:Non Diabetic:  <6%Goal of Therapy: <7%Additional Action Suggested:  >8%   . Sodium 04/15/2014 139  135 - 145 mEq/L Final  . Potassium 04/15/2014 4.3  3.5 - 5.1 mEq/L Final  . Chloride 04/15/2014 111  96 - 112 mEq/L Final  .  CO2 04/15/2014 18* 19 - 32 mEq/L Final  . Glucose, Bld 04/15/2014 116* 70 - 99 mg/dL Final  . BUN 04/15/2014 22  6 - 23 mg/dL Final  . Creatinine, Ser 04/15/2014 1.3  0.4 - 1.5 mg/dL Final  . Total Bilirubin 04/15/2014 0.9  0.2 - 1.2 mg/dL Final  . Alkaline Phosphatase 04/15/2014 54  39 - 117 U/L Final  . AST 04/15/2014 13  0 - 37 U/L Final  . ALT 04/15/2014 17  0 - 53 U/L Final  . Total Protein 04/15/2014 7.3  6.0 - 8.3 g/dL Final  . Albumin 04/15/2014 3.5  3.5 - 5.2 g/dL Final  . Calcium 04/15/2014 9.5  8.4 - 10.5 mg/dL Final  . GFR 04/15/2014 72.69  >60.00 mL/min Final  . Direct LDL 04/15/2014 80.1   Final   Optimal:  <100 mg/dLNear or Above Optimal:  100-129 mg/dLBorderline High:  130-159 mg/dLHigh:  160-189 mg/dLVery High:  >190 mg/dL    Physical Examination:  BP 115/62  Pulse 54  Temp(Src) 97.9 F (36.6 C)  Resp 16  Ht 5\' 7"  (1.702 m)  Wt 217 lb 9.6 oz (98.703 kg)  BMI 34.07 kg/m2  SpO2 97%   Repeat blood pressure: 120/70 standing No pedal edema  ASSESSMENT/PLAN:   Diabetes type 2, uncontrolled with obesity, BMI 34    His blood sugars are overall well controlled with A1c 6.6 Is benefiting from Invokana 100 mg and are reportedly not high after meals at home Not much change in his readings since stopping Amaryl  He is quite compliant with exercise but does need to lose some weight He will followup in 4 months  Hypertension: Well controlled currently without orthostasis; on 100 mg losartan from PCP  Hyperlipidemia: Well controlled with Lipitor 20 mg  Renal dysfunction: His creatinine is relatively improved at 1.3  No history of microalbuminuria   Isyss Espinal 04/20/2014, 10:07 AM

## 2014-04-20 NOTE — Patient Instructions (Signed)
Please check blood sugars at least half the time about 2 hours after any meal and times per week on waking up. Please bring blood sugar monitor to each visit

## 2014-05-19 ENCOUNTER — Telehealth: Payer: Self-pay

## 2014-05-19 NOTE — Telephone Encounter (Signed)
Called pt to ask if an eye exam. He stated the he did have one.   Dr. Allean Found at [facility]   [phone]  Called and they are sending over a copy of the exam notes now.

## 2014-06-07 ENCOUNTER — Other Ambulatory Visit: Payer: Self-pay | Admitting: Endocrinology

## 2014-06-28 ENCOUNTER — Other Ambulatory Visit: Payer: Self-pay | Admitting: Internal Medicine

## 2014-07-14 ENCOUNTER — Other Ambulatory Visit: Payer: Self-pay | Admitting: Internal Medicine

## 2014-07-16 ENCOUNTER — Telehealth: Payer: Self-pay | Admitting: Internal Medicine

## 2014-07-16 MED ORDER — CLONIDINE HCL 0.1 MG PO TABS: 0.1000 mg | ORAL_TABLET | Freq: Two times a day (BID) | ORAL | Status: DC

## 2014-07-16 NOTE — Telephone Encounter (Signed)
Done

## 2014-07-16 NOTE — Telephone Encounter (Signed)
Patient requesting clonidine to be sent to [facility] on [address].

## 2014-07-30 ENCOUNTER — Other Ambulatory Visit: Payer: Self-pay | Admitting: Internal Medicine

## 2014-07-30 NOTE — Telephone Encounter (Signed)
OK X1 

## 2014-08-05 ENCOUNTER — Other Ambulatory Visit: Payer: Self-pay | Admitting: Internal Medicine

## 2014-08-18 ENCOUNTER — Other Ambulatory Visit: Payer: Self-pay

## 2014-08-18 ENCOUNTER — Other Ambulatory Visit: Payer: Medicare Other

## 2014-08-21 ENCOUNTER — Ambulatory Visit: Payer: Medicare Other | Admitting: Endocrinology

## 2014-09-02 ENCOUNTER — Encounter: Payer: Self-pay | Admitting: Internal Medicine

## 2014-09-02 ENCOUNTER — Ambulatory Visit (INDEPENDENT_AMBULATORY_CARE_PROVIDER_SITE_OTHER): Payer: Medicare Other | Admitting: Internal Medicine

## 2014-09-02 VITALS — BP 140/90 | HR 58 | Temp 97.7°F | Ht 67.0 in | Wt 224.5 lb

## 2014-09-02 DIAGNOSIS — E1129 Type 2 diabetes mellitus with other diabetic kidney complication: Secondary | ICD-10-CM

## 2014-09-02 DIAGNOSIS — I10 Essential (primary) hypertension: Secondary | ICD-10-CM

## 2014-09-02 DIAGNOSIS — E1121 Type 2 diabetes mellitus with diabetic nephropathy: Secondary | ICD-10-CM

## 2014-09-02 NOTE — Progress Notes (Signed)
   Subjective:    Patient ID: Noah Barnes, male    DOB: Dec 31, 1945, 69 y.o.   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  He has been compliant with his medicines without adverse effects. Ophthalmology exam was done in June 2015 and revealed no retinopathy. He has not seen a podiatrist. He is followed by Dr. Dwyane Dee, endocrinologist. His last A1c was 6.6 in October 2015. Heis due for follow-up this month. Fasting blood sugars range 77-142. He denies hypoglycemic episodes.  He is not monitoring his blood pressure at home on a regular basis. He has no cardiopulmonary symptoms  Review of Systems  Polyuria, polyphagia, polydipsia absent.  There is no blurred vision, double vision, or loss of vision.   No postural dizziness noted. Denied are numbness, tingling, or burning of the extremities.  No nonhealing skin lesions present.  Weight is stable.  Chest pain, palpitations, tachycardia, exertional dyspnea, paroxysmal nocturnal dyspnea, claudication or edema are absent.      Objective:   Physical Exam Pertinent or positive findings include: Pattern alopecia is present.  He has a mustache.  Dental staining is present.  Abdomen is protuberant; ventral hernia is present. He has crepitus of the knees.  One half plus deep tendon reflexes at the knees.  General appearance :adequately nourished; in no distress.BMI 35.15 Eyes: No conjunctival inflammation or scleral icterus is present. Oral exam:  Lips and gums are healthy appearing.There is no oropharyngeal erythema or exudate noted. Heart:  Normal rate and regular rhythm. S1 and S2 normal without gallop, murmur, click, rub or other extra sounds   Lungs:Chest clear to auscultation; no wheezes, rhonchi,rales ,or rubs present.No increased work of breathing.  Abdomen: bowel sounds normal, soft and  non-tender without masses, organomegaly or hernias noted.  No guarding or rebound.  Vascular : all pulses equal ; no bruits present. Skin:Warm & dry.  Intact without suspicious lesions or rashes ; no tenting  Lymphatic: No lymphadenopathy is noted about the head, neck, axilla Neuro: Strength, tone normal.       Assessment & Plan:  See Current Assessment & Plan in Problem List under specific Diagnosis

## 2014-09-02 NOTE — Progress Notes (Signed)
Pre visit review using our clinic review tool, if applicable. No additional management support is needed unless otherwise documented below in the visit note. 

## 2014-09-02 NOTE — Patient Instructions (Signed)

## 2014-09-03 NOTE — Assessment & Plan Note (Signed)
Blood pressure goals reviewed. BMET reviewed; creat improved serially

## 2014-09-03 NOTE — Assessment & Plan Note (Signed)
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

## 2014-09-11 ENCOUNTER — Telehealth: Payer: Self-pay

## 2014-09-14 NOTE — Telephone Encounter (Signed)
Pt declined flu shot on 07/13/14.

## 2014-09-22 ENCOUNTER — Other Ambulatory Visit (INDEPENDENT_AMBULATORY_CARE_PROVIDER_SITE_OTHER): Payer: Medicare Other

## 2014-09-22 DIAGNOSIS — E119 Type 2 diabetes mellitus without complications: Secondary | ICD-10-CM

## 2014-09-22 LAB — HEMOGLOBIN A1C: Hgb A1c MFr Bld: 7.1 % — ABNORMAL HIGH (ref 4.6–6.5)

## 2014-09-22 LAB — COMPREHENSIVE METABOLIC PANEL
ALT: 30 U/L (ref 0–53)
AST: 19 U/L (ref 0–37)
Albumin: 4 g/dL (ref 3.5–5.2)
Alkaline Phosphatase: 54 U/L (ref 39–117)
BUN: 23 mg/dL (ref 6–23)
CO2: 23 mEq/L (ref 19–32)
Calcium: 9.4 mg/dL (ref 8.4–10.5)
Chloride: 107 mEq/L (ref 96–112)
Creatinine, Ser: 1.43 mg/dL (ref 0.40–1.50)
GFR: 63.3 mL/min (ref >60.00)
Glucose, Bld: 125 mg/dL — ABNORMAL HIGH (ref 70–99)
Potassium: 4.5 mEq/L (ref 3.5–5.1)
SODIUM: 135 meq/L (ref 135–145)
TOTAL PROTEIN: 7 g/dL (ref 6.0–8.3)
Total Bilirubin: 0.7 mg/dL (ref 0.2–1.2)

## 2014-09-25 ENCOUNTER — Ambulatory Visit: Payer: Self-pay | Admitting: Endocrinology

## 2014-10-01 ENCOUNTER — Other Ambulatory Visit: Payer: Self-pay | Admitting: Internal Medicine

## 2014-10-01 NOTE — Telephone Encounter (Signed)
Noah Barnes

## 2014-10-05 ENCOUNTER — Ambulatory Visit (INDEPENDENT_AMBULATORY_CARE_PROVIDER_SITE_OTHER): Payer: Medicare Other | Admitting: Endocrinology

## 2014-10-05 ENCOUNTER — Encounter: Payer: Self-pay | Admitting: Endocrinology

## 2014-10-05 VITALS — BP 142/90 | HR 50 | Temp 98.0°F | Resp 14 | Ht 67.0 in | Wt 227.6 lb

## 2014-10-05 DIAGNOSIS — E1165 Type 2 diabetes mellitus with hyperglycemia: Secondary | ICD-10-CM | POA: Diagnosis not present

## 2014-10-05 DIAGNOSIS — IMO0002 Reserved for concepts with insufficient information to code with codable children: Secondary | ICD-10-CM

## 2014-10-05 DIAGNOSIS — I10 Essential (primary) hypertension: Secondary | ICD-10-CM | POA: Diagnosis not present

## 2014-10-05 MED ORDER — SITAGLIPTIN PHOS-METFORMIN HCL 50-1000 MG PO TABS: 1.0000 | ORAL_TABLET | Freq: Two times a day (BID) | ORAL | Status: DC

## 2014-10-05 MED ORDER — CANAGLIFLOZIN 100 MG PO TABS: 100.0000 mg | ORAL_TABLET | Freq: Every day | ORAL | Status: DC

## 2014-10-05 NOTE — Progress Notes (Signed)
Patient ID: Noah Barnes, male   DOB: 09/03/1945, 69 y.o.   MRN: [id number]    Reason for Appointment : F/u for Type 2 Diabetes  History of Present Illness          Diagnosis: Type 2 diabetes mellitus, date of diagnosis: 2008       Past history: His diabetes was borderline in the beginning and not clear what medications he was started on. Has been taking metformin for several years and this was later changed to [place]. Amaryl probably started in 5/14 when blood sugars were higher, initially was given 1 mg and now has been taking 2 mg. He had a relatively high A1c of 7.1, difficulty losing weight and a glucose of 202 on his initial consultation in 06/2013 Since then he has been on Invokana in addition to his Janumet and Amaryl Invokana was reduced to 100 mg in early 2015 been creatinine had gone up to 1.6 His Amaryl was stopped in 6/15 because of tendency to occasional hypoglycemia  Recent history:  He is on a regimen of Janumet and Invokana 100 mg With this his blood sugars are again looking fairly good at home Not clear why his A1c has increased to 7.1%, previously 6.6 He has checked some readings after meals also and that sugars are fairly consistently near-normal  However he has not been exercising until recently admitted have been consistent with diet during winter months Has gained about 10 pounds since his last visit       Oral hypoglycemic drugs the patient is taking are: Janumet 50/500 twice a day, Invokana 100 mg   Side effects from medications have been:  none  Glucose monitoring:  done 1.4 times a day         Glucometer: One Touch   Blood Glucose readings recently from   PRE-MEAL Breakfast  10 AM  Dinner Bedtime Overall  Glucose range: 113-132  118-123   98-127   113-125    Median:  123      120    Hypoglycemia: none  Glycemic control:  Lab Results  Component Value Date   HGBA1C 7.1* 09/22/2014   HGBA1C 6.6* 04/15/2014   HGBA1C 6.3 12/19/2013   Lab Results   Component Value Date   MICROALBUR 1.2 09/18/2013   LDLCALC 122* 09/18/2013   CREATININE 1.43 09/22/2014    Self-care: The diet that the patient has been following has improved since he saw the nutritionist and his wife is cooking better, more vegetables in diet Exercise:  [facility]ing [place] [facility] [employer], restarted in 3/16    Dietician consultations: 08/2013              Retinal exam: Most recent: 2013  Weight history:   Wt Readings from Last 3 Encounters:  10/05/14 227 lb 9.6 oz (103.239 kg)  09/02/14 224 lb 8 oz (101.833 kg)  04/20/14 217 lb 9.6 oz (98.703 kg)      Medication List       This list is accurate as of: 10/05/14  9:47 AM.  Always use your most recent med list.               allopurinol 300 MG tablet  Commonly known as:  ZYLOPRIM  TAKE ONE TABLET BY MOUTH ONCE DAILY     atorvastatin 20 MG tablet  Commonly known as:  LIPITOR  TAKE ONE TABLET BY MOUTH ONCE DAILY     cloNIDine 0.1 MG tablet  Commonly known as:  CATAPRES  Take 1 tablet (0.1 mg total) by mouth 2 (two) times daily.     INVOKANA 100 MG Tabs tablet  Generic drug:  canagliflozin  TAKE ONE TABLET BY MOUTH ONCE DAILY BEFORE BREAKFAST     JANUMET 50-500 MG per tablet  Generic drug:  sitaGLIPtin-metformin  TAKE ONE TABLET BY MOUTH TWICE DAILY WITH  2  LARGEST  MEALS     Linaclotide 145 MCG Caps capsule  Commonly known as:  LINZESS  Take 1 capsule (145 mcg total) by mouth daily.     losartan 100 MG tablet  Commonly known as:  COZAAR  Take 1 tablet (100 mg total) by mouth daily.     metoprolol tartrate 25 MG tablet  Commonly known as:  LOPRESSOR  TAKE ONE TABLET BY MOUTH TWICE DAILY     ONE TOUCH ULTRA TEST test strip  Generic drug:  glucose blood  take as directed     ONETOUCH DELICA LANCETS FINE Misc  TEST AS DIRECTED.     sildenafil 100 MG tablet  Commonly known as:  VIAGRA  take by mouth as directed DO NOT take with NITROGLYCERIN     VIAGRA 100 MG tablet  Generic drug:   sildenafil  take as directed     verapamil 240 MG CR tablet  Commonly known as:  CALAN-SR  TAKE ONE-HALF TABLET BY MOUTH TWICE DAILY        Allergies: No Known Allergies  Past Medical History  Diagnosis Date  . Hypertension   . Diabetes mellitus   . Hyperlipidemia   . Gout   . ED (erectile dysfunction)   . IBS (irritable bowel syndrome)     Past Surgical History  Procedure Laterality Date  . Colonoscopy  1998    negative; no F/U  . Knee effusion tapped      Family History  Problem Relation Age of Onset  . Heart attack Mother 106  . Prostate cancer Father 14  . Diabetes Father   . Colon cancer Brother   . Stroke Neg Hx   . Irritable bowel syndrome Sister     x 2  . Irritable bowel syndrome Brother   . Irritable bowel syndrome Father     Social History:  reports that he quit smoking about 23 years ago. His smoking use included Cigarettes. He has never used smokeless tobacco. He reports that he drinks about 1.2 oz of alcohol per week. He reports that he does not use illicit drugs.    Review of Systems       Lipids: Has been on pravastatin for hyperlipidemia, baseline LDL? 162; labs as follows  Lab Results  Component Value Date   CHOL 189 09/18/2013   HDL 45.50 09/18/2013   LDLCALC 122* 09/18/2013   LDLDIRECT 80.1 04/15/2014   TRIG 107.0 09/18/2013   CHOLHDL 4 09/18/2013       The blood pressure has been high for several years and usually well controlled. He does monitor his blood pressure not regularly and does not remember his last reading Hypertension is usually followed by PCP and no changes were made in his regimen on the last visit He is on verapamil but tends to have relatively slow pulse rate     LABS:  No visits with results within 1 Week(s) from this visit. Latest known visit with results is:  Lab on 09/22/2014  Component Date Value Ref Range Status  . Hgb A1c MFr Bld 09/22/2014 7.1* 4.6 - 6.5 % Final  Glycemic Control Guidelines for  People with Diabetes:Non Diabetic:  <6%Goal of Therapy: <7%Additional Action Suggested:  >8%   . Sodium 09/22/2014 135  135 - 145 mEq/L Final  . Potassium 09/22/2014 4.5  3.5 - 5.1 mEq/L Final  . Chloride 09/22/2014 107  96 - 112 mEq/L Final  . CO2 09/22/2014 23  19 - 32 mEq/L Final  . Glucose, Bld 09/22/2014 125* 70 - 99 mg/dL Final  . BUN 09/22/2014 23  6 - 23 mg/dL Final  . Creatinine, Ser 09/22/2014 1.43  0.40 - 1.50 mg/dL Final  . Total Bilirubin 09/22/2014 0.7  0.2 - 1.2 mg/dL Final  . Alkaline Phosphatase 09/22/2014 54  39 - 117 U/L Final  . AST 09/22/2014 19  0 - 37 U/L Final  . ALT 09/22/2014 30  0 - 53 U/L Final  . Total Protein 09/22/2014 7.0  6.0 - 8.3 g/dL Final  . Albumin 09/22/2014 4.0  3.5 - 5.2 g/dL Final  . Calcium 09/22/2014 9.4  8.4 - 10.5 mg/dL Final  . GFR 09/22/2014 63.30  >60.00 mL/min Final    Physical Examination:  BP 142/90 mmHg  Pulse 50  Temp(Src) 98 F (36.7 C)  Resp 14  Ht 5\' 7"  (1.702 m)  Wt 227 lb 9.6 oz (103.239 kg)  BMI 35.64 kg/m2  SpO2 94%    No pedal edema  ASSESSMENT/PLAN:   Diabetes type 2, uncontrolled with obesity, BMI 34    His blood sugars are overall well controlled with excellent readings at home recently However his A1c is trending higher, now 7.1 and not clear to what extent this is from his inconsistent exercise regimen over the wintertime He thinks he is doing fairly well with diet  For now will increase his metformin to 1000 mg twice a day to improve his overall control as well is prevent further weight gain Will avoid increasing Invokana because of previous rise in creatinine with this  Hypertension: Not controlled currently, to continue follow-up with PCP  Renal insufficiency: Creatinine is minimally increased at 1.4, likely to be from hypertension/nephrosclerosis as he does not have any nephropathy Will need repeat urine microalbumin  Noah Barnes 10/05/2014, 9:47 AM

## 2014-10-12 ENCOUNTER — Other Ambulatory Visit: Payer: Self-pay

## 2014-10-12 MED ORDER — GLUCOSE BLOOD VI STRP: ORAL_STRIP | Status: DC

## 2014-12-06 ENCOUNTER — Other Ambulatory Visit: Payer: Self-pay | Admitting: Internal Medicine

## 2014-12-11 ENCOUNTER — Telehealth: Payer: Self-pay

## 2014-12-11 NOTE — Telephone Encounter (Signed)
Call to [name] and will come in at 8:45 next Tuesday for AWV prior to seeing Dr. Linna Darner

## 2014-12-15 ENCOUNTER — Ambulatory Visit (INDEPENDENT_AMBULATORY_CARE_PROVIDER_SITE_OTHER): Payer: Medicare Other | Admitting: Internal Medicine

## 2014-12-15 ENCOUNTER — Encounter: Payer: Self-pay | Admitting: Internal Medicine

## 2014-12-15 ENCOUNTER — Other Ambulatory Visit: Payer: Self-pay | Admitting: Internal Medicine

## 2014-12-15 VITALS — BP 132/80 | HR 52 | Temp 97.6°F | Resp 16 | Ht 68.0 in | Wt 211.8 lb

## 2014-12-15 DIAGNOSIS — E782 Mixed hyperlipidemia: Secondary | ICD-10-CM

## 2014-12-15 DIAGNOSIS — Z0000 Encounter for general adult medical examination without abnormal findings: Secondary | ICD-10-CM | POA: Diagnosis not present

## 2014-12-15 DIAGNOSIS — I10 Essential (primary) hypertension: Secondary | ICD-10-CM | POA: Diagnosis not present

## 2014-12-15 DIAGNOSIS — Z8639 Personal history of other endocrine, nutritional and metabolic disease: Secondary | ICD-10-CM

## 2014-12-15 DIAGNOSIS — Z8739 Personal history of other diseases of the musculoskeletal system and connective tissue: Secondary | ICD-10-CM

## 2014-12-15 NOTE — Progress Notes (Signed)
   Subjective:    Patient ID: Noah Barnes, male    DOB: [birth year], 69 y.o.   MRN: [id number]  HPI    Review of Systems     Objective:   Physical Exam        Assessment & Plan:

## 2014-12-15 NOTE — Progress Notes (Signed)
Subjective:    Patient ID: Noah Barnes, male    DOB: 09-24-1945, 69 y.o.   MRN: [id number]  HPI The patient is here to assess status of active health conditions.[facility] Nurse has made home visit recently.  PMH, FH, & Social History reviewed & updated. He did smoke from [phone] up to one third pack a day. He drinks an occasional alcohol beverage.  His mother had heart attack at 67; his father had diabetes and prostate cancer. His last PSA on record was 0.56; see genitourinary exam below.  A brother had colon cancer.  He does restrict red meat, fried foods, and salt. He is only exercising twice a week as walking for 60 minutes.He plans to increase this to daily now that school is out. He has no cardiopulmonary symptoms with exercise.  He does monitor his blood pressure. It ranges in the 130s over 80s. He has been compliant with his medicines without adverse effects.  His diabetes is managed by Dr. Dwyane Dee. Labs are scheduled for 6/30. Those include urine microalbumin, urinalysis, A1c, and comprehensive chemistry panel.  His colonoscopy was performed September 2015. Other than the constipation he has no GI symptoms. He has lost 16 pounds on purpose.  He is on allopurinol; he's not had a recent gout attack.    Review of Systems  Chest pain, palpitations, tachycardia, exertional dyspnea, paroxysmal nocturnal dyspnea, claudication or edema are absent. No unexplained weight loss, abdominal pain, significant dyspepsia, dysphagia, melena, rectal bleeding, or persistently small caliber stools. Dysuria, pyuria, hematuria, frequency, nocturia or polyuria are denied. Change in hair, skin, nails denied. No bowel changes of constipation or diarrhea. No intolerance to heat or cold.     Objective:   Physical Exam  Gen.: Adequately nourished in appearance. Alert, appropriate and cooperative throughout exam. Appears younger than stated age  Head: Normocephalic without obvious abnormalities;  pattern  alopecia  Eyes: No corneal or conjunctival inflammation noted. Pupils equal round reactive to light and accommodation. Extraocular motion intact.  Ears: External  ear exam reveals no significant lesions or deformities. Canals clear .TMs normal. Hearing is grossly normal bilaterally. Nose: External nasal exam reveals no deformity or inflammation. Nasal mucosa are pink and moist. Nasal septum deviated to the right. No lesions or exudates noted.   Mouth: Oral mucosa and oropharynx reveal no lesions or exudates. Teeth in good repair. Lower partial Neck: No deformities, masses, or tenderness noted. Range of motion and Thyroid normal. Lungs: Normal respiratory effort; chest expands symmetrically. Lungs are clear to auscultation without rales, wheezes, or increased work of breathing. Heart: Slow rate and regular rhythm. Normal S1 and S2. No gallop, click, or rub. No murmur. Abdomen: Protuberant. Bowel sounds normal; abdomen soft and nontender. No masses, or organomegaly  noted. Genitalia: Genitalia normal except for left varices. Prostate is normal without enlargement, asymmetry, nodularity, or induration                                 Musculoskeletal/extremities: No deformity or scoliosis noted of  the thoracic or lumbar spine. No clubbing, cyanosis, edema, or significant extremity  deformity noted.  Range of motion normal . Tone & strength normal. Hand joints normal  Fingernail  health good. Crepitus of knees  Able to lie down & sit up w/o help.  Negative SLR bilaterally Vascular: Carotid, radial artery, dorsalis pedis and  posterior tibial pulses are full and equal. No bruits present. Neurologic: Alert and  oriented x3. Deep tendon reflexes symmetrical and normal.  Gait normal        Skin: Intact without suspicious lesions or rashes. Lymph: No cervical, axillary, or inguinal lymphadenopathy present. Psych: Mood and affect are normal. Normally interactive                                                                                        Assessment & Plan:  See Current Assessment & Plan in Problem List under specific Diagnosis

## 2014-12-15 NOTE — Patient Instructions (Signed)
  [name] , Thank you for taking time to come for your Medicare Wellness Visit. I appreciate your ongoing commitment to your health goals. Please review the following plan we discussed and let me know if I can assist you in the future.   These are the goals we discussed: Goals    . Exercise 3x per week (30 min per time)     Will increase [facility] to [facility]; Will continue toward weight loss goals       This is a list of the screening recommended for you and due dates:  Health Maintenance  Topic Date Due  . Tetanus Vaccine  01/03/1966  . Shingles Vaccine  01/04/2007  . Pneumonia vaccines (1 of 2 - PCV13) 01/04/2012  . Complete foot exam   06/19/2014  . Urine Protein Check  09/19/2014  . Eye exam for diabetics  12/27/2014  . Flu Shot  02/01/2015  . Hemoglobin A1C  03/25/2015  . Colon Cancer Screening  03/04/2019    Declines vaccinations  [name] , Thank you for taking time to come for your Medicare Wellness Visit. I appreciate your ongoing commitment to your health goals. Please review the following plan we discussed and let me know if I can assist you in the future.   These are the goals we discussed: Goals    . Exercise 3x per week (30 min per time)     Will increase exercise to walking daily and [facility]; Will continue toward weight loss goals       Controllable  risk for heart disease reviewed for goal setting: Includes; family hx; HTN; decreased renal function; obesity; sedentary lifestyle Educated regarding Metabolic syndrome / Factors that increase risk for Heart Disease:  Excess body fat around the waist Waist circumference women >35 Waist circumference for Men >40  Triglycerides > 150 HDL < 50 BP > 130/85 Glucose > 100 Goals are determined by the physician and based on other relevant data and risk Plan Lifestyle changes  BMI reviewed  / goal to increase HDL  Risk Calculator:  [email or web address]

## 2014-12-15 NOTE — Assessment & Plan Note (Signed)
Uric acid

## 2014-12-15 NOTE — Assessment & Plan Note (Signed)
Comprehensive metabolic profile scheduled 12/31/14 by Dr. Dwyane Dee

## 2014-12-15 NOTE — Assessment & Plan Note (Signed)
Lipids , TSH 

## 2014-12-15 NOTE — Progress Notes (Signed)
Subjective:   Noah Barnes is a 69 y.o. male who presents for Medicare Annual/Subsequent preventive examination.  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   Discussed Goal to improve health based on risk  Screenings; Immunizations/ declines tdap and pneumonia vaccines at this time Colonoscopy; to be repeated in 2020 EKG deferred  PSA labs deferred  Vision: eyes to be checked this month; neg for diabetic retinopathy in the the past/ no other issues Hearing: 4000 hz both hears Dental: Just had surgery and have partial  Gave information on safety to take home;   Current Care Team reviewed and updated updated     Cardiac Risk Factors include: advanced age (>78men, >1 women);diabetes mellitus;dyslipidemia;family history of premature cardiovascular disease;hypertension;male gender     Objective:      Vitals: BP 132/80 mmHg  Pulse 52  Ht 5\' 8"  (1.727 m)  Wt 211 lb 12 oz (96.049 kg)  BMI 32.20 kg/m2  SpO2 96%  Tobacco History  Smoking status  . Former Smoker -- 0.30 packs/day  . Types: Cigarettes  . Quit date: 07/04/1991  Smokeless tobacco  . Never Used    Comment: smoked age [age], up to 1/3 ppd; may be less     Counseling given: Yes   Past Medical History  Diagnosis Date  . Hypertension   . Diabetes mellitus   . Hyperlipidemia   . Gout   . ED (erectile dysfunction)   . IBS (irritable bowel syndrome)    Past Surgical History  Procedure Laterality Date  . Colonoscopy  1998    negative; no F/U  . Knee effusion tapped     Family History  Problem Relation Age of Onset  . Heart attack Mother 85  . Prostate cancer Father 45  . Diabetes Father   . Colon cancer Brother   . Stroke Neg Hx   . Irritable bowel syndrome Sister     x 2  . Irritable bowel syndrome Brother   . Irritable bowel syndrome Father    History  Sexual Activity  . Sexual Activity: Not on file    Outpatient Encounter Prescriptions as of 12/15/2014  Medication Sig  . allopurinol (ZYLOPRIM) 300 MG tablet TAKE ONE TABLET BY MOUTH ONCE DAILY  . atorvastatin (LIPITOR) 20 MG tablet TAKE ONE TABLET BY MOUTH ONCE DAILY  .  canagliflozin (INVOKANA) 100 MG TABS tablet Take 1 tablet (100 mg total) by mouth daily.  . cloNIDine (CATAPRES) 0.1 MG tablet Take 1 tablet (0.1 mg total) by mouth 2 (two) times daily.  [name] glucose blood (ONE TOUCH ULTRA TEST) test strip Use to test blood sugar ICD 10 E11.29  . Linaclotide (LINZESS) 145 MCG CAPS capsule Take 1 capsule (145 mcg total) by mouth daily.  [name] losartan (COZAAR) 100 MG tablet Take 1 tablet (100 mg total) by mouth daily.  . metoprolol tartrate (LOPRESSOR) 25 MG tablet TAKE ONE TABLET BY MOUTH TWICE DAILY  . ONETOUCH DELICA LANCETS FINE MISC TEST AS DIRECTED.  [name] sildenafil (VIAGRA) 100 MG tablet take by mouth as directed DO NOT take with NITROGLYCERIN  .  sitaGLIPtin-metformin (JANUMET) 50-1000 MG per tablet Take 1 tablet by mouth 2 (two) times daily with a meal.  . verapamil (CALAN-SR) 240 MG CR tablet TAKE ONE-HALF TABLET BY MOUTH TWICE DAILY  . VIAGRA 100 MG tablet take as directed   No facility-administered encounter medications on file as of 12/15/2014.    Activities of Daily Living In your present state of health, do you have any difficulty performing the following activities: 12/15/2014 09/02/2014  Hearing? N N  Vision? N N  Difficulty concentrating or making decisions? N N  Walking or climbing stairs? N N  Dressing or bathing? N N  Doing errands, shopping? N N  Preparing Food and eating ? N -  Using the Toilet? N -  In the past six months, have you accidently leaked urine? N -  Do you have problems with loss of bowel control? N -  Managing your Medications? N -  Managing your Finances? N -  Housekeeping or managing your Housekeeping? N -    Patient Care Team: Hendricks Limes, MD as PCP - General   Assessment:    Objective:  Pt determined a personalized goal; see patient goals;  Assessment included: Taking meds without issues; no barriers identified Labs were and fup visit noted with MD if labs are due to be re-drawn. Stress: Recommendations for managing stress if assessed as a factor;  No Risk for hepatitis or high risk social behavior identified via hepatitis screen Educated on shingles and follow up with insurance company for co-pays or charges applied to Part D benefit. Educated on Vaccines; declined Administrator issues reviewed; Cognition assessed by AD8; Score 0 MMSE deferred as the patient stated they had no memory issues; No identified risk were noted; The patient was oriented x 3; appropriate in dress and manner and no objective failures at ADL's or IADL's.     Exercise Activities and Dietary recommendations Current Exercise Habits:: Structured exercise class;Home exercise routine, Time (Minutes): 60,  Frequency (Times/Week): 5, Weekly Exercise (Minutes/Week): 300, Intensity: Moderate  Goals    . Exercise 3x per week (30 min per time)     Will increase exercise to walking daily and [facility]; Will continue toward weight loss goals      Fall Risk Fall Risk  12/15/2014 09/02/2014 11/13/2012  Falls in the past year? No No No   Depression Screen PHQ 2/9 Scores 12/15/2014 09/02/2014 11/13/2012  PHQ - 2 Score 0 0 0    Cognitive Testing MMSE - Mini Mental State Exam 12/15/2014  Not completed: Unable to complete     There is no immunization history on file for this patient. Screening Tests Health Maintenance  Topic Date Due  . TETANUS/TDAP  01/03/1966  .  ZOSTAVAX  01/04/2007  . PNA vac Low Risk Adult (1 of 2 - PCV13) 01/04/2012  . FOOT EXAM  06/19/2014  . URINE MICROALBUMIN  09/19/2014  . OPHTHALMOLOGY EXAM  12/27/2014  . INFLUENZA VACCINE  02/01/2015  . HEMOGLOBIN A1C  03/25/2015  . COLONOSCOPY  03/04/2019      Plan:     Plan   The patient agrees to: Exercise more; enjoys summer coaching basketball Continue to lose weight Declines vaccinations  Advanced directive:Insurance company giving info    During the course of the visit the patient was educated and counseled about the following appropriate screening and preventive services:   Vaccines to include Pneumoccal, Influenza, Hepatitis B, Td, Zostavax, HCV/ declines  Electrocardiogram  Cardiovascular Disease  Colorectal cancer screening not due  Diabetes screening/ A1c 6.5 as per insurance 6/13  Prostate Cancer Screening deferred  Glaucoma screening  Nutrition counseling   Smoking cessation counseling  Patient Instructions (the written plan) was given to the patient.    Wynetta Fines, RN  12/15/2014

## 2014-12-15 NOTE — Progress Notes (Signed)
Pre visit review using our clinic review tool, if applicable. No additional management support is needed unless otherwise documented below in the visit note. 

## 2014-12-17 ENCOUNTER — Encounter: Payer: Self-pay | Admitting: Internal Medicine

## 2014-12-17 ENCOUNTER — Other Ambulatory Visit: Payer: Self-pay | Admitting: Internal Medicine

## 2014-12-17 ENCOUNTER — Ambulatory Visit (INDEPENDENT_AMBULATORY_CARE_PROVIDER_SITE_OTHER): Payer: Medicare Other | Admitting: Internal Medicine

## 2014-12-17 ENCOUNTER — Telehealth: Payer: Self-pay | Admitting: Internal Medicine

## 2014-12-17 VITALS — BP 112/78 | HR 52 | Ht 67.0 in | Wt 212.4 lb

## 2014-12-17 DIAGNOSIS — Z800 Family history of malignant neoplasm of digestive organs: Secondary | ICD-10-CM | POA: Diagnosis not present

## 2014-12-17 DIAGNOSIS — K589 Irritable bowel syndrome without diarrhea: Secondary | ICD-10-CM | POA: Diagnosis not present

## 2014-12-17 DIAGNOSIS — Z86010 Personal history of colonic polyps: Secondary | ICD-10-CM

## 2014-12-17 DIAGNOSIS — K5900 Constipation, unspecified: Secondary | ICD-10-CM | POA: Diagnosis not present

## 2014-12-17 MED ORDER — LINACLOTIDE 145 MCG PO CAPS
290.0000 ug | ORAL_CAPSULE | Freq: Every day | ORAL | Status: DC
Start: 2014-12-17 — End: 2014-12-18

## 2014-12-17 MED ORDER — NA SULFATE-K SULFATE-MG SULF 17.5-3.13-1.6 GM/177ML PO SOLN: ORAL | Status: DC

## 2014-12-17 NOTE — Progress Notes (Signed)
Subjective:    Patient ID: Noah Barnes, male    DOB: 24-May-1946, 69 y.o.   MRN: [id number]  HPI Noah Barnes is a 69 year old male with past medical history of IBS with constipation predominance, diverticulosis, adenomatous colon polyps, family history of colon cancer (brother diagnosed age 39) who is seen in follow-up. He also has a history of diabetes, hypertension, hyperlipidemia and gout. He is here alone today. He was last seen on 01/22/2014 and after that visit return for colonoscopy on 03/03/2014. This revealed 2 polyps at the hepatic flexure and ascending colon removed by snare. Moderate diverticulosis in the ascending and left colon. Mild melanosis throughout the colon. One of the polyps was a tubular adenoma.  He's been using Linzess 145 g daily. He is having some bowel movement but he feels not enough and he continues to feel incomplete evacuation. He tried stool softeners in addition with absolutely no benefit. He's having a bowel movement 3-4 days per week. At times they are loose but he still feels incomplete. No blood in his stool or melena. He feels lower abdominal pressure better if he can have a complete bowel movement. He states the best that he is felt was after colonoscopy preparation when he was completely cleaned out.   Review of Systems as per history of present illness, otherwise negative  Current Medications, Allergies, Past Medical History, Past Surgical History, Family History and Social History were reviewed in [facility] record.       Objective:   Physical Exam BP 112/78 mmHg  Pulse 52  Ht 5\' 7"  (1.702 m)  Wt 212 lb 6 oz (96.333 kg)  BMI 33.25 kg/m2 Constitutional: Well-developed and well-nourished. No distress. HEENT: Normocephalic and atraumatic.  Conjunctivae are normal.  No scleral icterus. Neck: Neck supple. Trachea midline. Cardiovascular: Normal rate, regular rhythm and intact distal pulses. No M/R/G Pulmonary/chest: Effort  normal and breath sounds normal. No wheezing, rales or rhonchi. Abdominal: Soft, nontender, nondistended. Bowel sounds active throughout. Extremities: no clubbing, cyanosis, or edema Neurological: Alert and oriented to person place and time. Skin: Skin is warm and dry. No rashes noted. Psychiatric: Normal mood and affect. Behavior is normal.  CBC    Component Value Date/Time   WBC 4.3* 05/09/2007 0000   RBC 5.11 05/09/2007 0000   HGB 13.9 05/09/2007 0000   HCT 41.8 05/09/2007 0000   PLT 226 05/09/2007 0000   MCV 81.8 05/09/2007 0000   MCHC 33.3 05/09/2007 0000   RDW 13.1 05/09/2007 0000   MONOABS 0.4 05/09/2007 0000   EOSABS 0.1 05/09/2007 0000   BASOSABS 0.0 05/09/2007 0000    Colonoscopy with path reviewed with the patient     Assessment & Plan:   69 year old male with past medical history of IBS with constipation predominance, diverticulosis, adenomatous colon polyps, family history of colon cancer (brother diagnosed age 17) who is seen in follow-up.  1. IBS with constipation -- no evidence of diverticulitis by exam today. some improvement but not complete with Linzess 145 g daily. We discussed options including increasing the dose or changing laxative altogether. I have recommended he begin Benefiber 1-2 tablespoons daily on a consistent basis. Hopefully this will bulk stool and allow for more complete evacuation. Decision made increased Linzess to 290 g daily 30 minutes before breakfast. I asked that he call me in 2-3 weeks to let me know if this has improved symptoms. If not would consider changing to lubiprostone. I also gave him a sample Suprep as  a purgative which she can use in split fashion or in one dose over 2-3 hours if necessary. Return in 3 months, sooner if necessary   2. Adenomatous colon polyps  -- repeat colonoscopy recommended September 2020   25 minutes spent with the patient today

## 2014-12-17 NOTE — Telephone Encounter (Signed)
Pt has been notified to have the pharmacy send a prior auth form and we will take care of the request

## 2014-12-17 NOTE — Patient Instructions (Addendum)
We have sent the following medications to your pharmacy for you to pick up at your convenience: Linzess 290 mg 1 tab by mouth daily  We have given you samples of the following medication to take: Suprep- Please follow direction as discussed with Dr. Hilarie Fredrickson   Please call our office in 2 weeks with an update on your condition  Please follow up in 3 months  Please purchase the following medications over the counter and take as directed: Benefiber- Take 1-2 Tablespoons daily for constipation

## 2014-12-18 ENCOUNTER — Telehealth: Payer: Self-pay | Admitting: Internal Medicine

## 2014-12-18 ENCOUNTER — Telehealth: Payer: Self-pay | Admitting: *Deleted

## 2014-12-18 MED ORDER — LINACLOTIDE 290 MCG PO CAPS: 290.0000 ug | ORAL_CAPSULE | Freq: Every day | ORAL | Status: DC

## 2014-12-18 NOTE — Telephone Encounter (Signed)
Insurance company called said they would not cover 145 mg 2 once a day.    They will however pay for Linzess 290 mg once a day. Called in new script for the patient of 290 linzess. Will send to message to Dr Hilarie Fredrickson    Dr Hilarie Fredrickson, [facility]- There is a quantity  limit on Linzess only 30 for 30  I changed this rx to [place] 290 once a day. Insurance will cover and patient informed

## 2014-12-18 NOTE — Telephone Encounter (Signed)
See Other phone note. Have spoke with patient

## 2014-12-18 NOTE — Telephone Encounter (Signed)
Pt has been notified that the linzess 145 bid dose has been changed to 290 once daily and the insurance will cover that

## 2014-12-31 ENCOUNTER — Other Ambulatory Visit: Payer: Self-pay | Admitting: *Deleted

## 2014-12-31 ENCOUNTER — Other Ambulatory Visit: Payer: Self-pay | Admitting: Internal Medicine

## 2014-12-31 ENCOUNTER — Other Ambulatory Visit (INDEPENDENT_AMBULATORY_CARE_PROVIDER_SITE_OTHER): Payer: Medicare Other

## 2014-12-31 DIAGNOSIS — E1165 Type 2 diabetes mellitus with hyperglycemia: Secondary | ICD-10-CM

## 2014-12-31 DIAGNOSIS — E782 Mixed hyperlipidemia: Secondary | ICD-10-CM | POA: Diagnosis not present

## 2014-12-31 DIAGNOSIS — Z8639 Personal history of other endocrine, nutritional and metabolic disease: Secondary | ICD-10-CM | POA: Diagnosis not present

## 2014-12-31 DIAGNOSIS — E119 Type 2 diabetes mellitus without complications: Secondary | ICD-10-CM

## 2014-12-31 DIAGNOSIS — Z8739 Personal history of other diseases of the musculoskeletal system and connective tissue: Secondary | ICD-10-CM

## 2014-12-31 DIAGNOSIS — IMO0002 Reserved for concepts with insufficient information to code with codable children: Secondary | ICD-10-CM

## 2014-12-31 LAB — HEMOGLOBIN A1C: Hgb A1c MFr Bld: 6.4 % (ref 4.6–6.5)

## 2014-12-31 LAB — LIPID PANEL
Cholesterol: 122 mg/dL (ref 0–200)
HDL: 40.6 mg/dL (ref >39.00)
LDL Cholesterol: 62 mg/dL (ref 0–99)
NonHDL: 81.4
Total CHOL/HDL Ratio: 3
Triglycerides: 99 mg/dL (ref 0.0–149.0)
VLDL: 19.8 mg/dL (ref 0.0–40.0)

## 2014-12-31 LAB — URIC ACID: Uric Acid, Serum: 3.7 mg/dL — ABNORMAL LOW (ref 4.0–7.8)

## 2014-12-31 LAB — TSH: TSH: 2.89 u[IU]/mL (ref 0.35–4.50)

## 2014-12-31 NOTE — Telephone Encounter (Signed)
allipurinol rx sent to pharm

## 2015-01-05 ENCOUNTER — Encounter: Payer: Self-pay | Admitting: Endocrinology

## 2015-01-05 ENCOUNTER — Ambulatory Visit (INDEPENDENT_AMBULATORY_CARE_PROVIDER_SITE_OTHER): Payer: Medicare Other | Admitting: Endocrinology

## 2015-01-05 VITALS — BP 128/76 | HR 53 | Temp 97.8°F | Resp 16 | Ht 67.0 in | Wt 219.2 lb

## 2015-01-05 DIAGNOSIS — E119 Type 2 diabetes mellitus without complications: Secondary | ICD-10-CM

## 2015-01-05 DIAGNOSIS — IMO0002 Reserved for concepts with insufficient information to code with codable children: Secondary | ICD-10-CM

## 2015-01-05 DIAGNOSIS — I10 Essential (primary) hypertension: Secondary | ICD-10-CM | POA: Diagnosis not present

## 2015-01-05 DIAGNOSIS — E1165 Type 2 diabetes mellitus with hyperglycemia: Secondary | ICD-10-CM

## 2015-01-05 LAB — MICROALBUMIN / CREATININE URINE RATIO
Creatinine,U: 166.2 mg/dL
Microalb Creat Ratio: 3.1 mg/g (ref 0.0–30.0)
Microalb, Ur: 5.2 mg/dL — ABNORMAL HIGH (ref 0.0–1.9)

## 2015-01-05 LAB — POCT URINALYSIS DIPSTICK
Bilirubin, UA: NEGATIVE
Blood, UA: NEGATIVE
Ketones, UA: NEGATIVE
Leukocytes, UA: NEGATIVE
Nitrite, UA: NEGATIVE
Protein, UA: NEGATIVE
SPEC GRAV UA: 1.025
Urobilinogen, UA: 0.2
pH, UA: 5

## 2015-01-05 NOTE — Patient Instructions (Signed)
Change Losartan to pm and take Verapramil with food

## 2015-01-05 NOTE — Progress Notes (Signed)
Patient ID: Noah Barnes, male   DOB: 09/27/1945, 69 y.o.   MRN: [id number]    Reason for Appointment : F/u for Type 2 Diabetes  History of Present Illness          Diagnosis: Type 2 diabetes mellitus, date of diagnosis: 2008       Past history: His diabetes was borderline in the beginning and not clear what medications he was started on. Has been taking metformin for several years and this was later changed to [place]. Amaryl probably started in 5/14 when blood sugars were higher, initially was given 1 mg and now has been taking 2 mg. He had a relatively high A1c of 7.1, difficulty losing weight and a glucose of 202 on his initial consultation in 06/2013 Since then he has been on Invokana in addition to his Janumet and Amaryl Invokana was reduced to 100 mg in early 2015 been creatinine had gone up to 1.6 His Amaryl was stopped in 6/15 because of tendency to occasional hypoglycemia  Recent history:  He is on a regimen of Janumet and Invokana 100 mg Since his last visit he has lost a significant amount of weight and his A1c is now upper normal compared to 7.1 before He has done better with exercising consistently and watching his portions better also With this his blood sugars are again looking fairly good at home Fasting blood sugars are only mildly increased and previously were averaging about 123 He has checked some readings after meals also these are fairly consistently near-normal       Oral hypoglycemic drugs the patient is taking are: Janumet 50/500 twice a day, Invokana 100 mg   Side effects from medications have been:  none  Glucose monitoring:  done 1.4 times a day         Glucometer: One Touch   Blood Glucose readings recently   Mean values apply above for all meters except median for One Touch  PRE-MEAL Fasting Lunch Dinner Bedtime Overall  Glucose range:       Mean/median:     111   POST-MEAL PC Breakfast PC Lunch PC Dinner  Glucose range:     Mean/median:         PRE-MEAL Breakfast  10 AM  Dinner Bedtime Overall  Glucose range: 113-132  118-123   98-127   113-125    Median:  123      120    Hypoglycemia: none  Glycemic control:   Lab Results  Component Value Date   HGBA1C 6.4 12/31/2014   HGBA1C 7.1* 09/22/2014   HGBA1C 6.6* 04/15/2014   Lab Results  Component Value Date   MICROALBUR 1.2 09/18/2013   [place] 62 12/31/2014   CREATININE 1.43 09/22/2014    Self-care: The diet that the patient has been following has improved since he saw the nutritionist and his wife is cooking better, more vegetables in diet Exercise:  walking 3/7 days a week at [place] consultations: 08/2013              Retinal exam: Most recent: 2013  Weight history:   Wt Readings from Last 3 Encounters:  01/05/15 219 lb 3.2 oz (99.428 kg)  12/17/14 212 lb 6 oz (96.333 kg)  12/15/14 211 lb 12 oz (96.049 kg)      Medication List       This list is accurate as of: 01/05/15  9:15 AM.  Always use your most recent med list.  allopurinol 300 MG tablet  Commonly known as:  ZYLOPRIM  TAKE ONE TABLET BY MOUTH ONCE DAILY     atorvastatin 20 MG tablet  Commonly known as:  LIPITOR  TAKE ONE TABLET BY MOUTH ONCE DAILY     canagliflozin 100 MG Tabs tablet  Commonly known as:  INVOKANA  Take 1 tablet (100 mg total) by mouth daily.     cloNIDine 0.1 MG tablet  Commonly known as:  CATAPRES  Take 1 tablet (0.1 mg total) by mouth 2 (two) times daily.     glucose blood test strip  Commonly known as:  ONE TOUCH ULTRA TEST  Use to test blood sugar ICD 10 E11.29     Linaclotide 290 MCG Caps capsule  Commonly known as:  LINZESS  Take 1 capsule (290 mcg total) by mouth daily.     losartan 100 MG tablet  Commonly known as:  COZAAR  Take 1 tablet (100 mg total) by mouth daily.     metoprolol tartrate 25 MG tablet  Commonly known as:  LOPRESSOR  TAKE ONE TABLET BY MOUTH TWICE DAILY     Na Sulfate-K Sulfate-Mg Sulf Soln  Take as  directed as discussed in office     [place]  TEST AS DIRECTED.     sitaGLIPtin-metformin 50-1000 MG per tablet  Commonly known as:  JANUMET  Take 1 tablet by mouth 2 (two) times daily with a meal.     verapamil 240 MG CR tablet  Commonly known as:  CALAN-SR  TAKE ONE-HALF TABLET BY MOUTH TWICE DAILY     VIAGRA 100 MG tablet  Generic drug:  sildenafil  take as directed        Allergies: No Known Allergies  Past Medical History  Diagnosis Date  . Hypertension   . Diabetes mellitus   . Hyperlipidemia   . Gout   . ED (erectile dysfunction)   . IBS (irritable bowel syndrome)     Past Surgical History  Procedure Laterality Date  . Colonoscopy  1998    negative; no F/U  . Knee effusion tapped      Family History  Problem Relation Age of Onset  . Heart attack Mother 34  . Prostate cancer Father 104  . Diabetes Father   . Colon cancer Brother   . Stroke Neg Hx   . Irritable bowel syndrome Sister     x 2  . Irritable bowel syndrome Brother   . Irritable bowel syndrome Father     Social History:  reports that he quit smoking about 23 years ago. His smoking use included Cigarettes. He smoked 0.30 packs per day. He has never used smokeless tobacco. He reports that he drinks about 1.2 oz of alcohol per week. He reports that he does not use illicit drugs.    Review of Systems       Lipids: Has been on pravastatin for hyperlipidemia, baseline LDL? 162; labs as follows  Lab Results  Component Value Date   CHOL 122 12/31/2014   HDL 40.60 12/31/2014   LDLCALC 62 12/31/2014   LDLDIRECT 80.1 04/15/2014   TRIG 99.0 12/31/2014   CHOLHDL 3 12/31/2014       The blood pressure has been high for several years and usually well controlled.  He does monitor his blood pressure, variable and higher in am   Hypertension is usually followed by PCP and no changes were made in his regimen on the last visit He is on  verapamil but tends to have relatively  slow pulse rate     LABS:  Lab on 12/31/2014  Component Date Value Ref Range Status  . Cholesterol 12/31/2014 122  0 - 200 mg/dL Final   ATP III Classification       Desirable:  < 200 mg/dL               Borderline High:  200 - 239 mg/dL          High:  > = 240 mg/dL  . Triglycerides 12/31/2014 99.0  0.0 - 149.0 mg/dL Final   Normal:  <150 mg/dLBorderline High:  150 - 199 mg/dL  . HDL 12/31/2014 40.60  >39.00 mg/dL Final  . VLDL 12/31/2014 19.8  0.0 - 40.0 mg/dL Final  . LDL Cholesterol 12/31/2014 62  0 - 99 mg/dL Final  . Total CHOL/HDL Ratio 12/31/2014 3   Final                  Men          Women1/2 Average Risk     3.4          3.3Average Risk          5.0          4.42X Average Risk          9.6          7.13X Average Risk          15.0          11.0                      . NonHDL 12/31/2014 81.40   Final   NOTE:  Non-HDL goal should be 30 mg/dL higher than patient's LDL goal (i.e. LDL goal of < 70 mg/dL, would have non-HDL goal of < 100 mg/dL)  . TSH 12/31/2014 2.89  0.35 - 4.50 uIU/mL Final  . Uric Acid, Serum 12/31/2014 3.7* 4.0 - 7.8 mg/dL Final  . Hgb A1c MFr Bld 12/31/2014 6.4  4.6 - 6.5 % Final   Glycemic Control Guidelines for People with Diabetes:Non Diabetic:  <6%Goal of Therapy: <7%Additional Action Suggested:  >8%     Physical Examination:  BP 128/76 mmHg  Pulse 53  Temp(Src) 97.8 F (36.6 C)  Resp 16  Ht 5\' 7"  (1.702 m)  Wt 219 lb 3.2 oz (99.428 kg)  BMI 34.32 kg/m2  SpO2 95%   Initial blood pressure 146/92  Diabetic foot exam shows normal monofilament sensation in the toes and plantar surfaces, no skin lesions or ulcers on the feet and normal pedal pulses He has a relatively flat feet  No pedal edema  ASSESSMENT/PLAN:   Diabetes type 2, uncontrolled with obesity, BMI 34    His blood sugars are overall well controlled with excellent readings at home recently A1c is down to 6.4% He has done well with his diet and exercise regimen Currently not on  Amaryl and he will continue his Janumet and Invokana 100 mg  Hypertension: Appears to be better controlled although he thinks his blood pressure is higher in the morning and may benefit from taking the losartan in the evening instead Will need repeat urine microalbumin  HYPERCHOLESTEROLEMIA: Much better controlled now especially with his weight loss and change in diet  Kambre Messner 01/05/2015, 9:15 AM

## 2015-01-08 ENCOUNTER — Encounter: Payer: Self-pay | Admitting: Internal Medicine

## 2015-01-08 ENCOUNTER — Other Ambulatory Visit: Payer: Self-pay | Admitting: Internal Medicine

## 2015-01-15 ENCOUNTER — Other Ambulatory Visit: Payer: Self-pay | Admitting: Endocrinology

## 2015-01-18 ENCOUNTER — Ambulatory Visit (INDEPENDENT_AMBULATORY_CARE_PROVIDER_SITE_OTHER): Payer: Medicare Other | Admitting: Internal Medicine

## 2015-01-18 ENCOUNTER — Encounter: Payer: Self-pay | Admitting: Internal Medicine

## 2015-01-18 VITALS — BP 138/78 | HR 57 | Temp 98.0°F | Wt 221.0 lb

## 2015-01-18 DIAGNOSIS — I872 Venous insufficiency (chronic) (peripheral): Secondary | ICD-10-CM | POA: Diagnosis not present

## 2015-01-18 DIAGNOSIS — I809 Phlebitis and thrombophlebitis of unspecified site: Secondary | ICD-10-CM | POA: Insufficient documentation

## 2015-01-18 MED ORDER — FUROSEMIDE 20 MG PO TABS: 20.0000 mg | ORAL_TABLET | Freq: Every day | ORAL | Status: DC | PRN

## 2015-01-18 NOTE — Progress Notes (Signed)
Subjective:  Patient ID: Noah Barnes, male    DOB: [birth year]  Age: 69 y.o. MRN: [id number]  CC: Leg Pain   HPI Noah Barnes presents for L inner calf pain x  2 weeks - "band like"  Outpatient Prescriptions Prior to Visit  Medication Sig Dispense Refill  . allopurinol (ZYLOPRIM) 300 MG tablet TAKE ONE TABLET BY MOUTH ONCE DAILY 30 tablet 3  . atorvastatin (LIPITOR) 20 MG tablet TAKE ONE TABLET BY MOUTH ONCE DAILY 90 tablet 0  . cloNIDine (CATAPRES) 0.1 MG tablet Take 1 tablet (0.1 mg total) by mouth 2 (two) times daily. 180 tablet 1  . glucose blood (ONE TOUCH ULTRA TEST) test strip Use to test blood sugar ICD 10 E11.29 100 each 3  . INVOKANA 100 MG TABS tablet TAKE ONE TABLET BY MOUTH ONCE DAILY 30 tablet 3  . Linaclotide (LINZESS) 290 MCG CAPS capsule Take 1 capsule (290 mcg total) by mouth daily. 30 capsule 3  . losartan (COZAAR) 100 MG tablet TAKE ONE TABLET BY MOUTH ONCE DAILY 90 tablet 1  . metoprolol tartrate (LOPRESSOR) 25 MG tablet TAKE ONE TABLET BY MOUTH TWICE DAILY 60 tablet 5  . Na Sulfate-K Sulfate-Mg Sulf SOLN Take as directed as discussed in office 354 mL 0  . ONETOUCH DELICA LANCETS FINE MISC TEST AS DIRECTED. 100 each 0  . sitaGLIPtin-metformin (JANUMET) 50-1000 MG per tablet Take 1 tablet by mouth 2 (two) times daily with a meal. 60 tablet 3  . verapamil (CALAN-SR) 240 MG CR tablet TAKE ONE-HALF TABLET BY MOUTH TWICE DAILY 90 tablet 0  . VIAGRA 100 MG tablet take as directed 6 tablet 2   No facility-administered medications prior to visit.    ROS Review of Systems  Constitutional: Positive for unexpected weight change. Negative for appetite change and fatigue.  HENT: Negative for congestion, nosebleeds, sneezing, sore throat and trouble swallowing.   Eyes: Negative for itching and visual disturbance.  Respiratory: Negative for cough.   Cardiovascular: Positive for leg swelling. Negative for chest pain and palpitations.  Gastrointestinal: Negative for  nausea, diarrhea, blood in stool and abdominal distention.  Genitourinary: Negative for frequency and hematuria.  Musculoskeletal: Negative for back pain, joint swelling, gait problem and neck pain.  Skin: Negative for rash.  Neurological: Negative for dizziness, tremors, speech difficulty and weakness.  Psychiatric/Behavioral: Negative for sleep disturbance, dysphoric mood and agitation. The patient is not nervous/anxious.     Objective:  BP 138/78 mmHg  Pulse 57  Temp(Src) 98 F (36.7 C) (Oral)  Wt 221 lb (100.245 kg)  SpO2 94%  BP Readings from Last 3 Encounters:  01/18/15 138/78  01/05/15 128/76  12/17/14 112/78    Wt Readings from Last 3 Encounters:  01/18/15 221 lb (100.245 kg)  01/05/15 219 lb 3.2 oz (99.428 kg)  12/17/14 212 lb 6 oz (96.333 kg)    Physical Exam  Constitutional: He is oriented to person, place, and time. He appears well-developed. No distress.  NAD  HENT:  Mouth/Throat: Oropharynx is clear and moist.  Eyes: Conjunctivae are normal. Pupils are equal, round, and reactive to light.  Neck: Normal range of motion. No JVD present. No thyromegaly present.  Cardiovascular: Normal rate, regular rhythm, normal heart sounds and intact distal pulses.  Exam reveals no gallop and no friction rub.   No murmur heard. Pulmonary/Chest: Effort normal and breath sounds normal. No respiratory distress. He has no wheezes. He has no rales. He exhibits no tenderness.  Abdominal: Soft. Bowel  sounds are normal. He exhibits no distension and no mass. There is no tenderness. There is no rebound and no guarding.  Musculoskeletal: Normal range of motion. He exhibits edema and tenderness.  Lymphadenopathy:    He has no cervical adenopathy.  Neurological: He is alert and oriented to person, place, and time. He has normal reflexes. No cranial nerve deficit. He exhibits normal muscle tone. He displays a negative Romberg sign. Coordination and gait normal.  Skin: Skin is warm and dry.  No rash noted.  Psychiatric: He has a normal mood and affect. His behavior is normal. Judgment and thought content normal.  Trace edema B L inner calf w/a band like tender area - inner calf. No DVT signs  Lab Results  Component Value Date   WBC 4.3* 05/09/2007   HGB 13.9 05/09/2007   HCT 41.8 05/09/2007   PLT 226 05/09/2007   GLUCOSE 125* 09/22/2014   CHOL 122 12/31/2014   TRIG 99.0 12/31/2014   HDL 40.60 12/31/2014   LDLDIRECT 80.1 04/15/2014   LDLCALC 62 12/31/2014   ALT 30 09/22/2014   AST 19 09/22/2014   NA 135 09/22/2014   K 4.5 09/22/2014   CL 107 09/22/2014   CREATININE 1.43 09/22/2014   BUN 23 09/22/2014   CO2 23 09/22/2014   TSH 2.89 12/31/2014   PSA 0.56 11/13/2012   HGBA1C 6.4 12/31/2014   MICROALBUR 5.2* 01/05/2015    No results found.  Assessment & Plan:   Noah Barnes was seen today for leg pain.  Diagnoses and all orders for this visit:  Chronic venous insufficiency  Superficial phlebitis  Other orders -     furosemide (LASIX) 20 MG tablet; Take 1-2 tablets (20-40 mg total) by mouth daily as needed.   I am having Mr. Copelin start on furosemide. I am also having him maintain his [facility] LANCETS FINE, metoprolol tartrate, cloNIDine, VIAGRA, sitaGLIPtin-metformin, glucose blood, verapamil, atorvastatin, Na Sulfate-K Sulfate-Mg Sulf, Linaclotide, allopurinol, losartan, and INVOKANA.  Meds ordered this encounter  Medications  . furosemide (LASIX) 20 MG tablet    Sig: Take 1-2 tablets (20-40 mg total) by mouth daily as needed.    Dispense:  60 tablet    Refill:  3     Follow-up: Return in about 4 weeks (around 02/15/2015) for a follow-up visit.  Walker Kehr, MD

## 2015-01-18 NOTE — Assessment & Plan Note (Addendum)
  Elevate legs Loose wt Lasix prn

## 2015-01-18 NOTE — Progress Notes (Signed)
Pre visit review using our clinic review tool, if applicable. No additional management support is needed unless otherwise documented below in the visit note. 

## 2015-01-18 NOTE — Patient Instructions (Addendum)
Take aspirin 325 mg three times a day Elevate legs Cut back on salt      Venous Stasis or Chronic Venous Insufficiency Chronic venous insufficiency, also called venous stasis, is a condition that affects the veins in the legs. The condition prevents blood from being pumped through these veins effectively. Blood may no longer be pumped effectively from the legs back to the heart. This condition can range from mild to severe. With proper treatment, you should be able to continue with an active life. CAUSES  Chronic venous insufficiency occurs when the vein walls become stretched, weakened, or damaged or when valves within the vein are damaged. Some common causes of this include:  High blood pressure inside the veins (venous hypertension).  Increased blood pressure in the leg veins from long periods of sitting or standing.  A blood clot that blocks blood flow in a vein (deep vein thrombosis).  Inflammation of a superficial vein (phlebitis) that causes a blood clot to form. RISK FACTORS Various things can make you more likely to develop chronic venous insufficiency, including:  Family history of this condition.  Obesity.  Pregnancy.  Sedentary lifestyle.  Smoking.  Jobs requiring long periods of standing or sitting in one place.  Being a certain age. Women in their 65s and 36s and men in their 63s are more likely to develop this condition. SIGNS AND SYMPTOMS  Symptoms may include:   Varicose veins.  Skin breakdown or ulcers.  Reddened or discolored skin on the leg.  Brown, smooth, tight, and painful skin just above the ankle, usually on the inside surface (lipodermatosclerosis).  Swelling. DIAGNOSIS  To diagnose this condition, your health care provider will take a medical history and do a physical exam. The following tests may be ordered to confirm the diagnosis:  Duplex ultrasound--A procedure that produces a picture of a blood vessel and nearby organs and also  provides information on blood flow through the blood vessel.  Plethysmography--A procedure that tests blood flow.  A venogram, or venography--A procedure used to look at the veins using X-ray and dye. TREATMENT The goals of treatment are to help you return to an active life and to minimize pain or disability. Treatment will depend on the severity of the condition. Medical procedures may be needed for severe cases. Treatment options may include:   Use of compression stockings. These can help with symptoms and lower the chances of the problem getting worse, but they do not cure the problem.  Sclerotherapy--A procedure involving an injection of a material that "dissolves" the damaged veins. Other veins in the network of blood vessels take over the function of the damaged veins.  Surgery to remove the vein or cut off blood flow through the vein (vein stripping or laser ablation surgery).  Surgery to repair a valve. HOME CARE INSTRUCTIONS   Wear compression stockings as directed by your health care provider.  Only take over-the-counter or prescription medicines for pain, discomfort, or fever as directed by your health care provider.  Follow up with your health care provider as directed. SEEK MEDICAL CARE IF:   You have redness, swelling, or increasing pain in the affected area.  You see a red streak or line that extends up or down from the affected area.  You have a breakdown or loss of skin in the affected area, even if the breakdown is small.  You have an injury to the affected area. SEEK IMMEDIATE MEDICAL CARE IF:   You have an injury and open  wound in the affected area.  Your pain is severe and does not improve with medicine.  You have sudden numbness or weakness in the foot or ankle below the affected area, or you have trouble moving your foot or ankle.  You have a fever or persistent symptoms for more than 2-3 days.  You have a fever and your symptoms suddenly get  worse. MAKE SURE YOU:   Understand these instructions.  Will watch your condition.  Will get help right away if you are not doing well or get worse. Document Released: 10/23/2006 Document Revised: 04/09/2013 Document Reviewed: 02/24/2013 [facility] Patient Information 2015 [place], [place]. This information is not intended to replace advice given to you by your health care provider. Make sure you discuss any questions you have with your health care provider.

## 2015-01-18 NOTE — Assessment & Plan Note (Addendum)
Take aspirin 325 mg three times a day x 1 wk Elevate legs

## 2015-01-25 ENCOUNTER — Other Ambulatory Visit: Payer: Self-pay | Admitting: Internal Medicine

## 2015-02-09 ENCOUNTER — Other Ambulatory Visit: Payer: Self-pay | Admitting: Internal Medicine

## 2015-02-10 NOTE — Telephone Encounter (Signed)
Please advise, Pts last office visit 3/16

## 2015-02-10 NOTE — Telephone Encounter (Signed)
OK with 3 refills

## 2015-02-11 ENCOUNTER — Other Ambulatory Visit: Payer: Self-pay | Admitting: Emergency Medicine

## 2015-02-11 ENCOUNTER — Other Ambulatory Visit: Payer: Self-pay | Admitting: Endocrinology

## 2015-02-11 MED ORDER — SILDENAFIL CITRATE 100 MG PO TABS: ORAL_TABLET | ORAL | Status: DC

## 2015-02-19 ENCOUNTER — Telehealth: Payer: Self-pay | Admitting: Internal Medicine

## 2015-02-19 NOTE — Telephone Encounter (Signed)
Pt states he has not had a "complete" BM in 3 days and he is taking his linzess. Pt thinks he may have an obstruction. Explained to pt that there are no appts for today that he could see his PCP, Urgent care or the ER. Pt wanted to schedule an appt for Monday. Pt scheduled to see Alonza Bogus PA Monday at 2pm. Pt given a bowel prep to see if that will make him feel better. Suprep given and pt to follow instructions per Alonza Bogus PA. Pt instructed to call back if he did not need to keep the appt on Monday, he verbalized understanding.

## 2015-02-22 ENCOUNTER — Ambulatory Visit (INDEPENDENT_AMBULATORY_CARE_PROVIDER_SITE_OTHER): Payer: Medicare Other | Admitting: Gastroenterology

## 2015-02-22 ENCOUNTER — Encounter: Payer: Self-pay | Admitting: Gastroenterology

## 2015-02-22 ENCOUNTER — Ambulatory Visit (INDEPENDENT_AMBULATORY_CARE_PROVIDER_SITE_OTHER)
Admission: RE | Admit: 2015-02-22 | Discharge: 2015-02-22 | Disposition: A | Discharge status: Home / Self Care | Payer: Medicare Other | Source: Ambulatory Visit | Attending: Gastroenterology | Admitting: Gastroenterology

## 2015-02-22 VITALS — BP 80/56 | HR 56 | Ht 67.0 in | Wt 209.0 lb

## 2015-02-22 DIAGNOSIS — K5909 Other constipation: Secondary | ICD-10-CM | POA: Diagnosis not present

## 2015-02-22 MED ORDER — LUBIPROSTONE 24 MCG PO CAPS: 24.0000 ug | ORAL_CAPSULE | Freq: Two times a day (BID) | ORAL | Status: DC

## 2015-02-22 NOTE — Progress Notes (Addendum)
     02/22/2015 KORY MOSTELLER [id number] [birth year]   History of Present Illness:  This is a 69 year old male who is known to Dr. Hilarie Fredrickson.  He has a past medical history of IBS with constipation predominance, diverticulosis, adenomatous colon polyps, family history of colon cancer (brother diagnosed age 4). He also has a history of diabetes, hypertension, hyperlipidemia and gout. He is here alone today.  His colonoscopy was 03/03/2014. This revealed 2 polyps at the hepatic flexure and ascending colon removed by snare. Moderate diverticulosis in the ascending and left colon. Mild melanosis throughout the colon. One of the polyps was a tubular adenoma.  He's been using Linzess 290 g daily since his last visit with Dr. Hilarie Fredrickson on June 16.  He called here on Friday asking for a bowel prep so was given a sample of Linzess, which he used over the weekend.  He says that he had a lot of liquid stool but still feels a lot of lower abdominal discomfort and bloating with some nausea.  Says that he feels unable to eat because it is uncomfortable (not painful).  Had one episode of vomiting last week.   Current Medications, Allergies, Past Medical History, Past Surgical History, Family History and Social History were reviewed in [employer] record.   Physical Exam: BP 80/56 mmHg  Pulse 56  Ht 5\' 7"  (1.702 m)  Wt 209 lb (94.802 kg)  BMI 32.73 kg/m2 General: Well developed black male in no acute distress Head: Normocephalic and atraumatic Eyes:  Sclerae anicteric, conjunctiva pink  Ears: Normal auditory acuity Lungs: Clear throughout to auscultation Heart: Slightly bradycardic Abdomen:  Soft non-distended.  Normal bowel sounds.  Mild lower abdominal TTP. Rectal:  No external hemorrhoids noted.  No impaction or masses noted on DRE.  Abdominal exam benign. Musculoskeletal: Symmetrical with no gross deformities  Extremities: No edema  Neurological: Alert oriented x 4, grossly  non-focal Psychological:  Alert and cooperative. Normal mood and affect  Assessment and Recommendations: 69 year old male with past medical history of IBS with constipation predominance, diverticulosis, adenomatous colon polyps, family history of colon cancer (brother diagnosed age 70) who is seen again for complaints of constipation.  1. IBS with constipation -- Still having constipation despite Linzess 290 mcg daily.  Took a Suprep over the weekend, but still thinks that he is full of stool.  No impaction noted on exam.  Will check abdominal x-ray.  If shows impaction then will send him to the ED.  If shows stool throughout the colon then will try some magnesium citrate.  Overall we will have him discontinue Linzess and start Amitiza 24 mcg BID with food. 2. Adenomatous colon polyps  -- repeat colonoscopy recommended September 2020   Addendum: Reviewed and agree with initial management. Jerene Bears, MD

## 2015-02-22 NOTE — Patient Instructions (Signed)
You have been instructed to go downstairs to the basement for an abdominal x-ray.  We have sent the following medications to your pharmacy for you to pick up at your convenience:  [place]

## 2015-02-24 ENCOUNTER — Inpatient Hospital Stay (HOSPITAL_COMMUNITY)
Admission: EM | Admit: 2015-02-24 | Discharge: 2015-03-04 | DRG: 683 | Disposition: A | Discharge status: Home / Self Care | Payer: Medicare Other | Attending: Internal Medicine | Admitting: Internal Medicine

## 2015-02-24 ENCOUNTER — Inpatient Hospital Stay (HOSPITAL_COMMUNITY): Payer: Medicare Other

## 2015-02-24 ENCOUNTER — Encounter (HOSPITAL_COMMUNITY): Payer: Self-pay

## 2015-02-24 DIAGNOSIS — E875 Hyperkalemia: Secondary | ICD-10-CM | POA: Diagnosis present

## 2015-02-24 DIAGNOSIS — T501X6A Underdosing of loop [high-ceiling] diuretics, initial encounter: Secondary | ICD-10-CM | POA: Diagnosis present

## 2015-02-24 DIAGNOSIS — E872 Acidosis: Secondary | ICD-10-CM | POA: Diagnosis present

## 2015-02-24 DIAGNOSIS — Z87891 Personal history of nicotine dependence: Secondary | ICD-10-CM | POA: Diagnosis not present

## 2015-02-24 DIAGNOSIS — R011 Cardiac murmur, unspecified: Secondary | ICD-10-CM | POA: Diagnosis not present

## 2015-02-24 DIAGNOSIS — E871 Hypo-osmolality and hyponatremia: Secondary | ICD-10-CM | POA: Diagnosis present

## 2015-02-24 DIAGNOSIS — N281 Cyst of kidney, acquired: Secondary | ICD-10-CM | POA: Diagnosis present

## 2015-02-24 DIAGNOSIS — E860 Dehydration: Secondary | ICD-10-CM | POA: Diagnosis present

## 2015-02-24 DIAGNOSIS — Z91128 Patient's intentional underdosing of medication regimen for other reason: Secondary | ICD-10-CM | POA: Diagnosis present

## 2015-02-24 DIAGNOSIS — I493 Ventricular premature depolarization: Secondary | ICD-10-CM | POA: Diagnosis present

## 2015-02-24 DIAGNOSIS — I10 Essential (primary) hypertension: Secondary | ICD-10-CM | POA: Diagnosis present

## 2015-02-24 DIAGNOSIS — E861 Hypovolemia: Secondary | ICD-10-CM | POA: Diagnosis present

## 2015-02-24 DIAGNOSIS — E8770 Fluid overload, unspecified: Secondary | ICD-10-CM | POA: Diagnosis present

## 2015-02-24 DIAGNOSIS — N289 Disorder of kidney and ureter, unspecified: Secondary | ICD-10-CM | POA: Diagnosis not present

## 2015-02-24 DIAGNOSIS — N182 Chronic kidney disease, stage 2 (mild): Secondary | ICD-10-CM | POA: Diagnosis present

## 2015-02-24 DIAGNOSIS — E1122 Type 2 diabetes mellitus with diabetic chronic kidney disease: Secondary | ICD-10-CM | POA: Diagnosis present

## 2015-02-24 DIAGNOSIS — E785 Hyperlipidemia, unspecified: Secondary | ICD-10-CM | POA: Diagnosis present

## 2015-02-24 DIAGNOSIS — K589 Irritable bowel syndrome without diarrhea: Secondary | ICD-10-CM | POA: Diagnosis present

## 2015-02-24 DIAGNOSIS — Z8249 Family history of ischemic heart disease and other diseases of the circulatory system: Secondary | ICD-10-CM

## 2015-02-24 DIAGNOSIS — Z833 Family history of diabetes mellitus: Secondary | ICD-10-CM

## 2015-02-24 DIAGNOSIS — I129 Hypertensive chronic kidney disease with stage 1 through stage 4 chronic kidney disease, or unspecified chronic kidney disease: Secondary | ICD-10-CM | POA: Diagnosis present

## 2015-02-24 DIAGNOSIS — Z79899 Other long term (current) drug therapy: Secondary | ICD-10-CM

## 2015-02-24 DIAGNOSIS — N2889 Other specified disorders of kidney and ureter: Secondary | ICD-10-CM | POA: Diagnosis present

## 2015-02-24 DIAGNOSIS — R531 Weakness: Secondary | ICD-10-CM | POA: Diagnosis present

## 2015-02-24 DIAGNOSIS — M542 Cervicalgia: Secondary | ICD-10-CM | POA: Diagnosis present

## 2015-02-24 DIAGNOSIS — N170 Acute kidney failure with tubular necrosis: Secondary | ICD-10-CM | POA: Diagnosis not present

## 2015-02-24 DIAGNOSIS — Z8042 Family history of malignant neoplasm of prostate: Secondary | ICD-10-CM | POA: Diagnosis not present

## 2015-02-24 DIAGNOSIS — I5032 Chronic diastolic (congestive) heart failure: Secondary | ICD-10-CM | POA: Diagnosis present

## 2015-02-24 DIAGNOSIS — I872 Venous insufficiency (chronic) (peripheral): Secondary | ICD-10-CM | POA: Diagnosis present

## 2015-02-24 DIAGNOSIS — R000 Tachycardia, unspecified: Secondary | ICD-10-CM | POA: Diagnosis not present

## 2015-02-24 DIAGNOSIS — I959 Hypotension, unspecified: Secondary | ICD-10-CM | POA: Diagnosis present

## 2015-02-24 DIAGNOSIS — N19 Unspecified kidney failure: Secondary | ICD-10-CM

## 2015-02-24 DIAGNOSIS — N179 Acute kidney failure, unspecified: Secondary | ICD-10-CM | POA: Diagnosis not present

## 2015-02-24 DIAGNOSIS — N189 Chronic kidney disease, unspecified: Secondary | ICD-10-CM | POA: Insufficient documentation

## 2015-02-24 LAB — CBC
HCT: 43.9 % (ref 39.0–52.0)
Hemoglobin: 14.3 g/dL (ref 13.0–17.0)
MCH: 27.2 pg (ref 26.0–34.0)
MCHC: 32.6 g/dL (ref 30.0–36.0)
MCV: 83.5 fL (ref 78.0–100.0)
PLATELETS: 272 10*3/uL (ref 150–400)
RBC: 5.26 MIL/uL (ref 4.22–5.81)
RDW: 14.2 % (ref 11.5–15.5)
WBC: 7.4 10*3/uL (ref 4.0–10.5)

## 2015-02-24 LAB — MRSA PCR SCREENING: MRSA BY PCR: NEGATIVE

## 2015-02-24 LAB — COMPREHENSIVE METABOLIC PANEL
ALK PHOS: 73 U/L (ref 38–126)
ALT: 16 U/L — ABNORMAL LOW (ref 17–63)
AST: 13 U/L — ABNORMAL LOW (ref 15–41)
Albumin: 4.6 g/dL (ref 3.5–5.0)
Anion gap: 14 (ref 5–15)
BUN: 99 mg/dL — AB (ref 6–20)
CO2: 13 mmol/L — ABNORMAL LOW (ref 22–32)
Calcium: 10 mg/dL (ref 8.9–10.3)
Chloride: 105 mmol/L (ref 101–111)
Creatinine, Ser: 9.89 mg/dL — ABNORMAL HIGH (ref 0.61–1.24)
GFR calc Af Amer: 5 mL/min — ABNORMAL LOW (ref >60)
GFR calc non Af Amer: 5 mL/min — ABNORMAL LOW (ref >60)
Glucose, Bld: 144 mg/dL — ABNORMAL HIGH (ref 65–99)
Potassium: 7.2 mmol/L (ref 3.5–5.1)
Sodium: 132 mmol/L — ABNORMAL LOW (ref 135–145)
Total Bilirubin: 0.3 mg/dL (ref 0.3–1.2)
Total Protein: 8.1 g/dL (ref 6.5–8.1)

## 2015-02-24 LAB — LIPASE, BLOOD: Lipase: 79 U/L — ABNORMAL HIGH (ref 22–51)

## 2015-02-24 LAB — CBG MONITORING, ED
Glucose-Capillary: 144 mg/dL — ABNORMAL HIGH (ref 65–99)
Glucose-Capillary: 125 mg/dL — ABNORMAL HIGH (ref 65–99)
Glucose-Capillary: 136 mg/dL — ABNORMAL HIGH (ref 65–99)

## 2015-02-24 LAB — GLUCOSE, CAPILLARY: Glucose-Capillary: 97 mg/dL (ref 65–99)

## 2015-02-24 LAB — TSH: TSH: 0.762 u[IU]/mL (ref 0.350–4.500)

## 2015-02-24 MED ORDER — LIDOCAINE HCL (PF) 1 % IJ SOLN
2.0000 mL | Freq: Once | INTRAMUSCULAR | Status: AC
  Administered 2015-02-24: 5 mL

## 2015-02-24 MED ORDER — SODIUM POLYSTYRENE SULFONATE 15 GM/60ML PO SUSP
ORAL | Status: AC
  Administered 2015-02-24: 30 g
  Filled 2015-02-24: qty 120

## 2015-02-24 MED ORDER — SODIUM CHLORIDE 0.9 % IV SOLN
1.0000 g | Freq: Once | INTRAVENOUS | Status: AC
  Administered 2015-02-24: 1 g via INTRAVENOUS
  Filled 2015-02-24: qty 10

## 2015-02-24 MED ORDER — LUBIPROSTONE 24 MCG PO CAPS
24.0000 ug | ORAL_CAPSULE | Freq: Two times a day (BID) | ORAL | Status: DC
  Administered 2015-02-25 – 2015-03-04 (×15): 24 ug via ORAL
  Filled 2015-02-24 (×19): qty 1

## 2015-02-24 MED ORDER — ONDANSETRON HCL 4 MG/2ML IJ SOLN: 4.0000 mg | Freq: Four times a day (QID) | INTRAMUSCULAR | Status: DC | PRN

## 2015-02-24 MED ORDER — ONDANSETRON HCL 4 MG PO TABS: 4.0000 mg | ORAL_TABLET | Freq: Four times a day (QID) | ORAL | Status: DC | PRN

## 2015-02-24 MED ORDER — INSULIN ASPART 100 UNIT/ML ~~LOC~~ SOLN
0.0000 [IU] | Freq: Three times a day (TID) | SUBCUTANEOUS | Status: DC
  Administered 2015-02-25: 1 [IU] via SUBCUTANEOUS
  Administered 2015-02-25 – 2015-02-26 (×4): 2 [IU] via SUBCUTANEOUS
  Administered 2015-02-27 (×2): 3 [IU] via SUBCUTANEOUS
  Administered 2015-02-27 – 2015-03-02 (×8): 2 [IU] via SUBCUTANEOUS
  Administered 2015-03-02: 1 [IU] via SUBCUTANEOUS
  Administered 2015-03-02: 2 [IU] via SUBCUTANEOUS
  Administered 2015-03-03: 1 [IU] via SUBCUTANEOUS
  Administered 2015-03-03: 2 [IU] via SUBCUTANEOUS
  Administered 2015-03-03 – 2015-03-04 (×2): 1 [IU] via SUBCUTANEOUS
  Administered 2015-03-04: 5 [IU] via SUBCUTANEOUS

## 2015-02-24 MED ORDER — METOPROLOL TARTRATE 25 MG PO TABS
25.0000 mg | ORAL_TABLET | Freq: Two times a day (BID) | ORAL | Status: DC
  Administered 2015-02-24: 25 mg via ORAL
  Filled 2015-02-24: qty 1

## 2015-02-24 MED ORDER — HEPARIN SODIUM (PORCINE) 5000 UNIT/ML IJ SOLN
5000.0000 [IU] | Freq: Three times a day (TID) | INTRAMUSCULAR | Status: DC
  Administered 2015-02-24 – 2015-03-04 (×24): 5000 [IU] via SUBCUTANEOUS
  Filled 2015-02-24 (×26): qty 1

## 2015-02-24 MED ORDER — ATORVASTATIN CALCIUM 20 MG PO TABS
20.0000 mg | ORAL_TABLET | Freq: Every day | ORAL | Status: DC
Start: 2015-02-25 — End: 2015-03-04
  Administered 2015-02-25 – 2015-03-04 (×8): 20 mg via ORAL
  Filled 2015-02-24 (×6): qty 1
  Filled 2015-02-24: qty 2
  Filled 2015-02-24: qty 1

## 2015-02-24 MED ORDER — LIDOCAINE HCL (PF) 1 % IJ SOLN
INTRAMUSCULAR | Status: AC
  Administered 2015-02-24: 5 mL
  Filled 2015-02-24: qty 5

## 2015-02-24 MED ORDER — INSULIN ASPART 100 UNIT/ML IV SOLN
10.0000 [IU] | Freq: Once | INTRAVENOUS | Status: AC
  Administered 2015-02-24: 10 [IU] via INTRAVENOUS
  Filled 2015-02-24: qty 0.1

## 2015-02-24 MED ORDER — SODIUM CHLORIDE 0.9 % IV SOLN
INTRAVENOUS | Status: DC
  Administered 2015-02-24 – 2015-02-25 (×2): via INTRAVENOUS

## 2015-02-24 MED ORDER — DEXTROSE 50 % IV SOLN
1.0000 | Freq: Once | INTRAVENOUS | Status: AC
  Administered 2015-02-24: 50 mL via INTRAVENOUS
  Filled 2015-02-24: qty 50

## 2015-02-24 NOTE — ED Provider Notes (Signed)
CSN: [id number]     Arrival date & time 02/24/15  1233 History   First MD Initiated Contact with Patient 02/24/15 1505     Chief Complaint  Patient presents with  . Abdominal Pain  . Emesis     (Consider location/radiation/quality/duration/timing/severity/associated sxs/prior Treatment) HPI Patient complained of generalized weakness with vague abdominal discomfort, feels like indigestion" onset one week ago with some nausea and slight amount of vomiting. He is not nauseated presently. Denies pain presently. Only complaint presently generalized weakness. No other associated symptoms. Patient started on Amitza 2 days ago for constipation Past Medical History  Diagnosis Date  . Hypertension   . Diabetes mellitus   . Hyperlipidemia   . Gout   . ED (erectile dysfunction)   . IBS (irritable bowel syndrome)    Past Surgical History  Procedure Laterality Date  . Colonoscopy  1998    negative; no F/U  . Knee effusion tapped     Family History  Problem Relation Age of Onset  . Heart attack Mother 36  . Prostate cancer Father 81  . Diabetes Father   . Colon cancer Brother   . Stroke Neg Hx   . Irritable bowel syndrome Sister     x 2  . Irritable bowel syndrome Brother   . Irritable bowel syndrome Father    Social History  Substance Use Topics  . Smoking status: Former Smoker -- 0.30 packs/day    Types: Cigarettes    Quit date: 07/04/1991  . Smokeless tobacco: Never Used     Comment: smoked age [age], up to 1/3 ppd; may be less  . Alcohol Use: 1.2 oz/week    2 Glasses of wine per week     Comment:  socially    Review of Systems  HENT: Negative.   Respiratory: Negative.   Cardiovascular: Negative.   Gastrointestinal: Positive for vomiting, abdominal pain and constipation.  Musculoskeletal: Negative.   Skin: Negative.   Allergic/Immunologic: Positive for immunocompromised state.       Diabetic  Neurological: Positive for weakness.  Psychiatric/Behavioral: Negative.    All other systems reviewed and are negative.     Allergies  Review of patient's allergies indicates no known allergies.  Home Medications   Prior to Admission medications   Medication Sig Start Date End Date Taking? Authorizing Provider  allopurinol (ZYLOPRIM) 300 MG tablet TAKE ONE TABLET BY MOUTH ONCE DAILY Patient taking differently: TAKE 300 MG BY MOUTH ONCE DAILY. 12/31/14  Yes Hendricks Limes, MD  atorvastatin (LIPITOR) 20 MG tablet TAKE ONE TABLET BY MOUTH ONCE DAILY Patient taking differently: TAKE 20 MG BY MOUTH ONCE DAILY. 12/16/14  Yes Hendricks Limes, MD  cloNIDine (CATAPRES) 0.1 MG tablet TAKE ONE TABLET BY MOUTH TWICE DAILY Patient taking differently: TAKE 0.1 MG BY MOUTH TWICE DAILY. 01/25/15  Yes Hendricks Limes, MD  furosemide (LASIX) 20 MG tablet Take 1-2 tablets (20-40 mg total) by mouth daily as needed. Patient taking differently: Take 20 mg by mouth daily.  01/18/15  Yes Aleksei Plotnikov V, MD  glucose blood (ONE TOUCH ULTRA TEST) test strip Use to test blood sugar ICD 10 E11.29 10/12/14  Yes Hendricks Limes, MD  INVOKANA 100 MG TABS tablet TAKE ONE TABLET BY MOUTH ONCE DAILY Patient taking differently: TAKE 100 MG BY MOUTH ONCE DAILY. 02/11/15  Yes Elayne Snare, MD  JANUMET 50-1000 MG per tablet TAKE ONE TABLET BY MOUTH TWICE DAILY WITH MEALS 02/11/15  Yes Elayne Snare, MD  losartan (COZAAR)  100 MG tablet TAKE ONE TABLET BY MOUTH ONCE DAILY Patient taking differently: TAKE 100 MG BY MOUTH ONCE DAILY. 01/08/15  Yes Hendricks Limes, MD  lubiprostone (AMITIZA) 24 MCG capsule Take 1 capsule (24 mcg total) by mouth 2 (two) times daily with a meal. 02/22/15  Yes Jessica D Zehr, PA-C  metoprolol tartrate (LOPRESSOR) 25 MG tablet TAKE ONE TABLET BY MOUTH TWICE DAILY Patient taking differently: TAKE 25 MG BY MOUTH TWICE DAILY. 01/25/15  Yes Hendricks Limes, MD  Na Sulfate-K Sulfate-Mg Sulf SOLN Take as directed as discussed in office 12/17/14  Yes Jerene Bears, MD  [facility] DELICA  LANCETS FINE MISC TEST AS DIRECTED. 04/15/14  Yes Hendricks Limes, MD  sildenafil (VIAGRA) 100 MG tablet take as directed Patient taking differently: Take 100 mg by mouth as needed for erectile dysfunction. take as directed 02/11/15  Yes Hendricks Limes, MD  verapamil (CALAN-SR) 240 MG CR tablet TAKE ONE-HALF TABLET BY MOUTH TWICE DAILY Patient taking differently: TAKE 120 MG BY MOUTH TWICE DAILY. 12/07/14  Yes Hendricks Limes, MD  Linaclotide [facility]) 290 MCG CAPS capsule Take 1 capsule (290 mcg total) by mouth daily. Patient not taking: Reported on 02/24/2015 12/18/14   Jerene Bears, MD   BP 94/53 mmHg  Pulse 63  Temp(Src) 98 F (36.7 C) (Oral)  Resp 14  SpO2 98% Physical Exam  Constitutional: He appears well-developed and well-nourished. No distress.  HENT:  Head: Normocephalic and atraumatic.  Eyes: Conjunctivae are normal. Pupils are equal, round, and reactive to light.  Neck: Neck supple. No tracheal deviation present. No thyromegaly present.  Cardiovascular: Normal rate and regular rhythm.   No murmur heard. Pulmonary/Chest: Effort normal and breath sounds normal.  Abdominal: Soft. Bowel sounds are normal. He exhibits no distension. There is no tenderness.  Musculoskeletal: Normal range of motion. He exhibits no edema or tenderness.  Neurological: He is alert. Coordination normal.  Skin: Skin is warm and dry. No rash noted.  Psychiatric: He has a normal mood and affect.  Nursing note and vitals reviewed.   ED Course  Procedures (including critical care time) Labs Review Labs Reviewed  LIPASE, BLOOD - Abnormal; Notable for the following:    Lipase 79 (*)    All other components within normal limits  COMPREHENSIVE METABOLIC PANEL - Abnormal; Notable for the following:    Sodium 132 (*)    Potassium 7.2 (*)    CO2 13 (*)    Glucose, Bld 144 (*)    BUN 99 (*)    Creatinine, Ser 9.89 (*)    AST 13 (*)    ALT 16 (*)    GFR calc non Af Amer 5 (*)    GFR calc Af Amer 5  (*)    All other components within normal limits  CBG MONITORING, ED - Abnormal; Notable for the following:    Glucose-Capillary 144 (*)    All other components within normal limits  CBG MONITORING, ED - Abnormal; Notable for the following:    Glucose-Capillary 125 (*)    All other components within normal limits  CBC  URINALYSIS, ROUTINE W REFLEX MICROSCOPIC (NOT AT [facility])    Imaging Review No results found. I have personally reviewed and evaluated these images and lab results as part of my medical decision-making.   EKG Interpretation   Date/Time:  Wednesday February 24 2015 15:10:34 EDT Ventricular Rate:  61 PR Interval:  212 QRS Duration: 96 QT Interval:  363 QTC Calculation:  366 R Axis:   -11 Text Interpretation:  Sinus rhythm Borderline prolonged PR interval  Borderline repolarization abnormality Borderline ST elevation,  anterolateral leads Baseline wander in lead(s) V2 V6 No significant change  since last tracing Confirmed by Winfred Leeds  MD, Keegen Heffern [phone]) on 02/24/2015  4:51:42 PM     4:30 PM patient is resting comfortably. No distress MDM  Patient noted to be in acute renal failure. Hypotensive. Spoke with Dr.Schertz from nephrology service. He will come to evaluate patient. He states that patient we'll not emergently needs dialysis. Calcium gluconate, intravenous insulin, D50 intravenously ordered to treat hyperkalemia. As well as intravenous fluids. We will add Kayexalate 30 g by mouth at Dr. Jonnie Finner suggestion. Spoke with Dr.Meyers who will arrange to admit patient to stepdown unit I spoke with patient's son via telephone is a physician who advised him diagnosis with patient's permission. I also spoke with patient's wife with patient's permission. Results for orders placed or performed during the hospital encounter of 02/24/15  Lipase, blood  Result Value Ref Range   Lipase 79 (H) 22 - 51 U/L  Comprehensive metabolic panel  Result Value Ref Range   Sodium 132 (L) 135  - 145 mmol/L   Potassium 7.2 (HH) 3.5 - 5.1 mmol/L   Chloride 105 101 - 111 mmol/L   CO2 13 (L) 22 - 32 mmol/L   Glucose, Bld 144 (H) 65 - 99 mg/dL   BUN 99 (H) 6 - 20 mg/dL   Creatinine, Ser 9.89 (H) 0.61 - 1.24 mg/dL   Calcium 10.0 8.9 - 10.3 mg/dL   Total Protein 8.1 6.5 - 8.1 g/dL   Albumin 4.6 3.5 - 5.0 g/dL   AST 13 (L) 15 - 41 U/L   ALT 16 (L) 17 - 63 U/L   Alkaline Phosphatase 73 38 - 126 U/L   Total Bilirubin 0.3 0.3 - 1.2 mg/dL   GFR calc non Af Amer 5 (L) >60 mL/min   GFR calc Af Amer 5 (L) >60 mL/min   Anion gap 14 5 - 15  CBC  Result Value Ref Range   WBC 7.4 4.0 - 10.5 K/uL   RBC 5.26 4.22 - 5.81 MIL/uL   Hemoglobin 14.3 13.0 - 17.0 g/dL   HCT 43.9 39.0 - 52.0 %   MCV 83.5 78.0 - 100.0 fL   MCH 27.2 26.0 - 34.0 pg   MCHC 32.6 30.0 - 36.0 g/dL   RDW 14.2 11.5 - 15.5 %   Platelets 272 150 - 400 K/uL  CBG monitoring, ED  Result Value Ref Range   Glucose-Capillary 144 (H) 65 - 99 mg/dL  POC CBG, ED  Result Value Ref Range   Glucose-Capillary 125 (H) 65 - 99 mg/dL   Comment 1 Notify RN    Comment 2 Document in Chart    Dg Abd 2 Views  02/22/2015   CLINICAL DATA:  Worsening constipation for 2 weeks. Irritable bowel syndrome.  EXAM: ABDOMEN - 2 VIEW  COMPARISON:  None.  FINDINGS: The bowel gas pattern is normal. There is no evidence of free air. An 8 mm radiodensity is seen in the left upper quadrant, suspicious for left renal calculus.  IMPRESSION: Normal bowel gas pattern.  Probable 8 mm left renal calculus.   Electronically Signed   By: Earle Gell M.D.   On: 02/22/2015 16:08    Final diagnoses:  None  Dx #1 acute renal failure #2 hyperkalemia #3 hypotension CRITICAL CARE Performed by: Orlie Dakin Total critical care time: 40  minutes Critical care time was exclusive of separately billable procedures and treating other patients. Critical care was necessary to treat or prevent imminent or life-threatening deterioration. Critical care was time spent  personally by me on the following activities: development of treatment plan with patient and/or surrogate as well as nursing, discussions with consultants, evaluation of patient's response to treatment, examination of patient, obtaining history from patient or surrogate, ordering and performing treatments and interventions, ordering and review of laboratory studies, ordering and review of radiographic studies, pulse oximetry and re-evaluation of patient's condition.     Orlie Dakin, MD 02/24/15 [phone]

## 2015-02-24 NOTE — ED Notes (Signed)
MD at bedside. Admitting  

## 2015-02-24 NOTE — ED Notes (Signed)
Pt made aware of need for UA

## 2015-02-24 NOTE — H&P (Signed)
[facility] History and Physical  Noah Barnes [id number] DOB: [birth year] DOA: 02/24/2015  Referring physician: ED physician, Dr. Winfred Leeds PCP: Unice Cobble, MD   Chief Complaint: fatigue and abd discomfort   HPI:  Pt is 69 yo male with known HTN, DM type II, IBS with constipation predominance (on Linzess since June 2016), last colonoscopy 03/03/2014 revealed two polyps and diverticulosis, now presenting to [facility] ED with main concern of one week duration of progressive weakness, fatigue, poor oral intake, abd discomfort. Pt explains his discomfort is intermittent and associated with cramps, 7/10 in severity when present, no specific radiation, worse with eating and with no specific alleviating factors. Pt reports compliance with medications, no recent changes in regimen except addition of Linzess for IBF in June 2016. Pt denies chest pain or shortness of breath, no fevers, chills.   In Ed, pt noted to be hemodynamically stable, VSS (initial BP was 79/51 but repeat value indicated normal BP). Blood work notable for Na 132, K 7.2, BUN/Cr 99/9.89. TRH asked to admit to SDU for further evaluation. Nephrologist Dr. Jonnie Finner consulted by ED doctor.   Assessment and Plan: Active Problems: Acute renal failure - unclear etiology at this time and possibly multifactorial, secondary to Losartan, lasix, also pre renal etiology, ? ATN - possible component of CKD as well given multiple risk factors including HTN, HLD, DM, obesity  - agree with admission to SDU - repeat BMP now - will hold nephrotoxins including Losartan and Lasix  - place on IVF and monitor clinical response - obtain renal US, urine sodium and urine Cr Hyperkalemia with metabolic acidosis  - in the setting of ARF - one dose of kayexalate given - repeat BMP pending - follow up BMP in AM Hyponatremia - stop Lasix for now - IVF and repeat BMP in AM HTN - hold BP medications verapamil, losartan, lasix, clonidine - continue  metoprolol  - add home regimen if indicated  HLD - I have reviewed records from one month ago, LDL was 65 at that time so no need for repeat lipid panel at this time  - continue statin  DM type II  - hold oral medications until oral intake improved - place on SSI for now - A1C one month ago was 6.4, no need to repeat A1C on this admission  Obesity - BMI > 30   DVT prophylaxis - Heparin SQ  Radiological Exams on Admission: No results found.  Code Status: Full Family Communication: Pt and family at bedside Disposition Plan: Admit for further evaluation    Mart Piggs [facility] [id number]   Review of Systems:  Constitutional: Negative for fever, chills. Negative for diaphoresis.  HENT: Negative for hearing loss, ear pain, nosebleeds, congestion, sore throat, neck pain, tinnitus and ear discharge.   Eyes: Negative for blurred vision, double vision, photophobia, pain, discharge and redness.  Respiratory: Negative for cough,  wheezing and stridor.   Cardiovascular: Negative for chest pain, palpitations, orthopnea, claudication and leg swelling.  Gastrointestinal: Negative for heartburn, constipation, blood in stool and melena.  Genitourinary: Negative for dysuria, urgency, frequency, hematuria and flank pain.  Musculoskeletal: Negative for myalgias, back pain, joint pain and falls.  Skin: Negative for itching and rash.  Neurological: Negative for dizziness and weakness Endo/Heme/Allergies: Negative for environmental allergies and polydipsia. Does not bruise/bleed easily.  Psychiatric/Behavioral: Negative for suicidal ideas. The patient is not nervous/anxious.      Past Medical History  Diagnosis Date  . Hypertension   . Diabetes mellitus   .  Hyperlipidemia   . Gout   . ED (erectile dysfunction)   . IBS (irritable bowel syndrome)     Past Surgical History  Procedure Laterality Date  . Colonoscopy  1998    negative; no F/U  . Knee effusion tapped      Social History:   reports that he quit smoking about 23 years ago. His smoking use included Cigarettes. He smoked 0.30 packs per day. He has never used smokeless tobacco. He reports that he drinks about 1.2 oz of alcohol per week. He reports that he does not use illicit drugs.  No Known Allergies  Family History  Problem Relation Age of Onset  . Heart attack Mother 2  . Prostate cancer Father 71  . Diabetes Father   . Colon cancer Brother   . Stroke Neg Hx   . Irritable bowel syndrome Sister     x 2  . Irritable bowel syndrome Brother   . Irritable bowel syndrome Father     Medication Sig  allopurinol (ZYLOPRIM) 300 MG tablet TAKE ONE TABLET BY MOUTH ONCE DAILY Patient taking differently: TAKE 300 MG BY MOUTH ONCE DAILY.  atorvastatin (LIPITOR) 20 MG tablet TAKE ONE TABLET BY MOUTH ONCE DAILY Patient taking differently: TAKE 20 MG BY MOUTH ONCE DAILY.  cloNIDine (CATAPRES) 0.1 MG tablet TAKE ONE TABLET BY MOUTH TWICE DAILY Patient taking differently: TAKE 0.1 MG BY MOUTH TWICE DAILY.  furosemide (LASIX) 20 MG tablet Take 1-2 tablets (20-40 mg total) by mouth daily as needed. Patient taking differently: Take 20 mg by mouth daily.   glucose blood (ONE TOUCH ULTRA TEST) test strip Use to test blood sugar ICD 10 E11.29  INVOKANA 100 MG TABS tablet TAKE ONE TABLET BY MOUTH ONCE DAILY Patient taking differently: TAKE 100 MG BY MOUTH ONCE DAILY.  JANUMET 50-1000 MG per tablet TAKE ONE TABLET BY MOUTH TWICE DAILY WITH MEALS  losartan (COZAAR) 100 MG tablet TAKE ONE TABLET BY MOUTH ONCE DAILY Patient taking differently: TAKE 100 MG BY MOUTH ONCE DAILY.  lubiprostone (AMITIZA) 24 MCG capsule Take 1 capsule (24 mcg total) by mouth 2 (two) times daily with a meal.  metoprolol tartrate (LOPRESSOR) 25 MG tablet TAKE ONE TABLET BY MOUTH TWICE DAILY Patient taking differently: TAKE 25 MG BY MOUTH TWICE DAILY.  sildenafil (VIAGRA) 100 MG tablet take as directed Patient taking differently: Take 100 mg by mouth as  needed for erectile dysfunction. take as directed  verapamil (CALAN-SR) 240 MG CR tablet TAKE ONE-HALF TABLET BY MOUTH TWICE DAILY Patient taking differently: TAKE 120 MG BY MOUTH TWICE DAILY.  Linaclotide (LINZESS) 290 MCG CAPS capsule Take 1 capsule (290 mcg total) by mouth daily. Patient not taking: Reported on 02/24/2015    Physical Exam: Filed Vitals:   02/24/15 1251 02/24/15 1457  BP: 124/68 94/53  Pulse: 71 63  Temp: 98 F (36.7 C)   TempSrc: Oral   Resp: 16 14  SpO2: 98% 98%    Physical Exam  Constitutional: Appears well-developed and well-nourished. No distress.  HENT: Normocephalic. External right and left ear normal. Dry MM Eyes: Conjunctivae and EOM are normal. PERRLA, no scleral icterus.  Neck: Normal ROM. Neck supple. No JVD. No tracheal deviation. No thyromegaly.  CVS: Regular rhythm, tachycardic, S1/S2 +, no murmurs, no gallops, no carotid bruit.  Pulmonary: Effort and breath sounds normal, no stridor, rhonchi, wheezes, rales.  Abdominal: Soft. BS +,  no distension, tenderness, rebound or guarding.  Musculoskeletal: Normal range of motion. Trace bilateral LE edema  Lymphadenopathy: No lymphadenopathy noted, cervical, inguinal. Neuro: Alert. Normal reflexes, muscle tone coordination. No cranial nerve deficit. Skin: Skin is warm and dry. No rash noted. Not diaphoretic. No erythema. No pallor.  Psychiatric: Normal mood and affect. Behavior, judgment, thought content normal.   Labs on Admission:  Basic Metabolic Panel:  Recent Labs Lab 02/24/15 1356  NA 132*  K 7.2*  CL 105  CO2 13*  GLUCOSE 144*  BUN 99*  CREATININE 9.89*  CALCIUM 10.0   Liver Function Tests:  Recent Labs Lab 02/24/15 1356  AST 13*  ALT 16*  ALKPHOS 73  BILITOT 0.3  PROT 8.1  ALBUMIN 4.6    Recent Labs Lab 02/24/15 1356  LIPASE 79*   No results for input(s): AMMONIA in the last 168 hours. CBC:  Recent Labs Lab 02/24/15 1356  WBC 7.4  HGB 14.3  HCT 43.9  MCV 83.5   PLT 272   CBG:  Recent Labs Lab 02/24/15 1258 02/24/15 1505  GLUCAP 144* 125*    EKG: NSR, borderline ST elevation in anterolateral leads   If 7PM-7AM, please contact night-coverage [email or web address] Password [email or web address] 02/24/2015, 5:02 PM

## 2015-02-24 NOTE — Progress Notes (Signed)
eLink Physician-Brief Progress Note Patient Name: Noah Barnes DOB: 02/19/1946 MRN: [id number]   Date of Service  02/24/2015  HPI/Events of Note  Pt admitted with AKI, acidosis, hyperkalemia. QRS not widened on monitor. Renal service has been consulted.   eICU Interventions  Monitor. Will review Renal's plan when available     Intervention Category Evaluation Type: New Patient Evaluation  Merton Border 02/24/2015, 7:03 PM

## 2015-02-24 NOTE — Progress Notes (Signed)
Utilization Review completed.  Jontez Redfield RN CM  

## 2015-02-24 NOTE — ED Notes (Signed)
Down Time charting

## 2015-02-24 NOTE — ED Notes (Signed)
Pt c/o increasing lower abdominal pain and emesis x 1 week.  Pain score 4/10.  Pt was seen by GI x 2 days ago and sts "they couldn't find anything wrong, but now nothing will stay down."  Hx of IBS w/ constipation. Pt started on Amitiza x 2 days ago.

## 2015-02-24 NOTE — ED Notes (Signed)
Report attempted 

## 2015-02-24 NOTE — ED Notes (Signed)
Downtime

## 2015-02-24 NOTE — ED Notes (Signed)
MD at bedside. 

## 2015-02-24 NOTE — ED Notes (Signed)
Report Attempted

## 2015-02-25 DIAGNOSIS — I10 Essential (primary) hypertension: Secondary | ICD-10-CM

## 2015-02-25 DIAGNOSIS — E1122 Type 2 diabetes mellitus with diabetic chronic kidney disease: Secondary | ICD-10-CM | POA: Diagnosis present

## 2015-02-25 DIAGNOSIS — I5032 Chronic diastolic (congestive) heart failure: Secondary | ICD-10-CM | POA: Diagnosis present

## 2015-02-25 DIAGNOSIS — N2889 Other specified disorders of kidney and ureter: Secondary | ICD-10-CM | POA: Diagnosis present

## 2015-02-25 DIAGNOSIS — N182 Chronic kidney disease, stage 2 (mild): Secondary | ICD-10-CM | POA: Diagnosis present

## 2015-02-25 LAB — URINALYSIS, ROUTINE W REFLEX MICROSCOPIC
Bilirubin Urine: NEGATIVE
Glucose, UA: 100 mg/dL — AB
KETONES UR: NEGATIVE mg/dL
Leukocytes, UA: NEGATIVE
Nitrite: NEGATIVE
Protein, ur: NEGATIVE mg/dL
Specific Gravity, Urine: 1.012 (ref 1.005–1.030)
Urobilinogen, UA: 0.2 mg/dL (ref 0.0–1.0)
pH: 5 (ref 5.0–8.0)

## 2015-02-25 LAB — BASIC METABOLIC PANEL
Anion gap: 10 (ref 5–15)
Anion gap: 10 (ref 5–15)
Anion gap: 11 (ref 5–15)
BUN: 99 mg/dL — ABNORMAL HIGH (ref 6–20)
BUN: 99 mg/dL — AB (ref 6–20)
BUN: 95 mg/dL — ABNORMAL HIGH (ref 6–20)
CO2: 15 mmol/L — ABNORMAL LOW (ref 22–32)
CO2: 14 mmol/L — ABNORMAL LOW (ref 22–32)
CO2: 16 mmol/L — ABNORMAL LOW (ref 22–32)
Calcium: 9.7 mg/dL (ref 8.9–10.3)
Calcium: 9.5 mg/dL (ref 8.9–10.3)
Calcium: 9.4 mg/dL (ref 8.9–10.3)
Chloride: 110 mmol/L (ref 101–111)
Chloride: 112 mmol/L — ABNORMAL HIGH (ref 101–111)
Chloride: 112 mmol/L — ABNORMAL HIGH (ref 101–111)
Creatinine, Ser: 10.22 mg/dL — ABNORMAL HIGH (ref 0.61–1.24)
Creatinine, Ser: 9.73 mg/dL — ABNORMAL HIGH (ref 0.61–1.24)
Creatinine, Ser: 9.02 mg/dL — ABNORMAL HIGH (ref 0.61–1.24)
GFR calc Af Amer: 5 mL/min — ABNORMAL LOW (ref >60)
GFR calc Af Amer: 6 mL/min — ABNORMAL LOW (ref >60)
GFR calc Af Amer: 6 mL/min — ABNORMAL LOW (ref >60)
GFR calc non Af Amer: 5 mL/min — ABNORMAL LOW (ref >60)
GFR calc non Af Amer: 5 mL/min — ABNORMAL LOW (ref >60)
GFR calc non Af Amer: 5 mL/min — ABNORMAL LOW (ref >60)
GLUCOSE: 119 mg/dL — AB (ref 65–99)
GLUCOSE: 151 mg/dL — AB (ref 65–99)
Glucose, Bld: 97 mg/dL (ref 65–99)
POTASSIUM: 7.3 mmol/L — AB (ref 3.5–5.1)
Potassium: 6.1 mmol/L (ref 3.5–5.1)
Potassium: 5.3 mmol/L — ABNORMAL HIGH (ref 3.5–5.1)
Sodium: 135 mmol/L (ref 135–145)
Sodium: 136 mmol/L (ref 135–145)
Sodium: 139 mmol/L (ref 135–145)

## 2015-02-25 LAB — CBC
HCT: 39.7 % (ref 39.0–52.0)
Hemoglobin: 12.9 g/dL — ABNORMAL LOW (ref 13.0–17.0)
MCH: 27.2 pg (ref 26.0–34.0)
MCHC: 32.5 g/dL (ref 30.0–36.0)
MCV: 83.6 fL (ref 78.0–100.0)
Platelets: 223 10*3/uL (ref 150–400)
RBC: 4.75 MIL/uL (ref 4.22–5.81)
RDW: 14.2 % (ref 11.5–15.5)
WBC: 6.5 10*3/uL (ref 4.0–10.5)

## 2015-02-25 LAB — GLUCOSE, CAPILLARY
Glucose-Capillary: 100 mg/dL — ABNORMAL HIGH (ref 65–99)
Glucose-Capillary: 156 mg/dL — ABNORMAL HIGH (ref 65–99)
Glucose-Capillary: 147 mg/dL — ABNORMAL HIGH (ref 65–99)
Glucose-Capillary: 166 mg/dL — ABNORMAL HIGH (ref 65–99)

## 2015-02-25 LAB — CREATININE, URINE, RANDOM: Creatinine, Urine: 149.83 mg/dL

## 2015-02-25 LAB — URINE MICROSCOPIC-ADD ON

## 2015-02-25 LAB — SODIUM, URINE, RANDOM: Sodium, Ur: 90 mmol/L

## 2015-02-25 MED ORDER — SODIUM BICARBONATE 8.4 % IV SOLN
INTRAVENOUS | Status: DC
  Administered 2015-02-25 – 2015-02-28 (×9): via INTRAVENOUS
  Filled 2015-02-25 (×28): qty 150

## 2015-02-25 MED ORDER — INSULIN ASPART 100 UNIT/ML ~~LOC~~ SOLN: 10.0000 [IU] | Freq: Once | SUBCUTANEOUS | Status: DC

## 2015-02-25 MED ORDER — DEXTROSE 50 % IV SOLN
1.0000 | Freq: Once | INTRAVENOUS | Status: AC
  Administered 2015-02-25: 50 mL via INTRAVENOUS
  Filled 2015-02-25: qty 50

## 2015-02-25 MED ORDER — SODIUM POLYSTYRENE SULFONATE 15 GM/60ML PO SUSP
30.0000 g | Freq: Once | ORAL | Status: AC
  Administered 2015-02-25: 30 g via ORAL
  Filled 2015-02-25: qty 120

## 2015-02-25 MED ORDER — INSULIN ASPART 100 UNIT/ML IV SOLN
10.0000 [IU] | Freq: Once | INTRAVENOUS | Status: AC
  Administered 2015-02-25: 10 [IU] via INTRAVENOUS

## 2015-02-25 MED ORDER — SODIUM CHLORIDE 0.9 % IV SOLN
1.0000 g | Freq: Once | INTRAVENOUS | Status: AC
  Administered 2015-02-25: 1 g via INTRAVENOUS
  Filled 2015-02-25: qty 10

## 2015-02-25 MED ORDER — HYDRALAZINE HCL 20 MG/ML IJ SOLN
5.0000 mg | Freq: Once | INTRAMUSCULAR | Status: AC
  Administered 2015-02-25: 5 mg via INTRAVENOUS
  Filled 2015-02-25: qty 1

## 2015-02-25 NOTE — Progress Notes (Signed)
PROGRESS NOTE  Noah Barnes [id number] DOB: [birth year] DOA: 02/24/2015 PCP: Unice Cobble, MD  HPI/Recap of past 40 hours: 69 year old male past history of diabetes mellitus, stage II chronic kidney disease and hypertension who said 1 week of poor by mouth intake due to constipation issues because of irritable bowel syndrome along with one episode of vomiting presented on 8/24 with weakness and found to be in acute renal failure with a creatinine of 9 (baseline 1.3) and hyperkalemia with a potassium of 7.2. Patient admitted to hospitalist service and started on IV fluids plus given Kayexalate. Renal ultrasound noted no signs of obstruction, but did incidentally note a 3 cm solid-appearing right renal mass. Patient placed in stepdown.  Today, creatinine about the same, although patient looks markedly better. Potassium down to 6.2 and he received more Kayexalate. He himself has no complaints.  Assessment/Plan: Principal Problem:   AKI (acute kidney injury) in the setting of stage II chronic kidney disease: Looks to be hypovolemia while patient on ARB plus diarrhetic. Seen by nephrology. Expected to improve significantly over next 24 hours. Still making urine, no signs of uremia and urinalysis unrevealing. Active Problems:   Essential hypertension: Blood pressure trending up, when necessary hydralazine   IBS (irritable bowel syndrome): Bowels moving with Kayexalate   CKD stage 2 due to type 2 diabetes mellitus: Watching blood sugars, sliding scale for now   Chronic diastolic heart failure: Aggressively hydrating. Watching daily weights   Right renal mass: As renal function stabilizes, we'll further dusky with CT or MRI   Code Status: Full code  Family Communication: Spoke with patient's son by phone  Disposition Plan: Transfer to floor. Likely will be in hospital for several more days   Consultants:  Nephrology  Procedures:  None  Antibiotics:  None   Objective: BP  159/85 mmHg  Pulse 88  Temp(Src) 97.8 F (36.6 C) (Oral)  Resp 18  Ht 5\' 7"  (1.702 m)  Wt 95.6 kg (210 lb 12.2 oz)  BMI 33.00 kg/m2  SpO2 98%  Intake/Output Summary (Last 24 hours) at 02/25/15 1602 Last data filed at 02/25/15 1500  Gross per 24 hour  Intake 1838.33 ml  Output    704 ml  Net 1134.33 ml   Filed Weights   02/25/15 0500  Weight: 95.6 kg (210 lb 12.2 oz)    Exam:   General:  Alert and oriented 3, no acute distress  Cardiovascular: Regular rate and rhythm, S1-S2  Respiratory: Clear to auscultation bilaterally  Abdomen: Soft, nontender, nondistended, positive bowel sounds  Musculoskeletal: No clubbing or cyanosis or edema   Data Reviewed: Basic Metabolic Panel:  Recent Labs Lab 02/24/15 1356 02/24/15 2000 02/25/15 0336 02/25/15 1500  NA 132* 135 136 139  K 7.2* 7.3* 6.1* 5.3*  CL 105 110 112* 112*  CO2 13* 15* 14* 16*  GLUCOSE 144* 97 119* 151*  BUN 99* 99* 99* PENDING  CREATININE 9.89* 10.22* 9.73* 9.02*  CALCIUM 10.0 9.7 9.5 9.4   Liver Function Tests:  Recent Labs Lab 02/24/15 1356  AST 13*  ALT 16*  ALKPHOS 73  BILITOT 0.3  PROT 8.1  ALBUMIN 4.6    Recent Labs Lab 02/24/15 1356  LIPASE 79*   No results for input(s): AMMONIA in the last 168 hours. CBC:  Recent Labs Lab 02/24/15 1356 02/25/15 0336  WBC 7.4 6.5  HGB 14.3 12.9*  HCT 43.9 39.7  MCV 83.5 83.6  PLT 272 223   Cardiac Enzymes:   No results  for input(s): CKTOTAL, CKMB, CKMBINDEX, TROPONINI in the last 168 hours. BNP (last 3 results) No results for input(s): BNP in the last 8760 hours.  ProBNP (last 3 results) No results for input(s): PROBNP in the last 8760 hours.  CBG:  Recent Labs Lab 02/24/15 1505 02/24/15 1707 02/24/15 2125 02/25/15 0738 02/25/15 1225  GLUCAP 125* 136* 97 100* 156*    Recent Results (from the past 240 hour(s))  MRSA PCR Screening     Status: None   Collection Time: 02/24/15  7:00 PM  Result Value Ref Range Status    MRSA by PCR NEGATIVE NEGATIVE Final    Comment:        The GeneXpert MRSA Assay (FDA approved for NASAL specimens only), is one component of a comprehensive MRSA colonization surveillance program. It is not intended to diagnose MRSA infection nor to guide or monitor treatment for MRSA infections.      Studies: US Renal Port  02/25/2015   CLINICAL DATA:  Renal failure.  EXAM: RENAL / URINARY TRACT ULTRASOUND COMPLETE  COMPARISON:  None.  FINDINGS: Right Kidney:  Length: 10.5 cm. Circumscribed cyst in the upper pole measuring about 1.6 cm maximal diameter. Solid-appearing lesion in the midpole measuring 3.1 cm maximal diameter. Recommend follow-up with contrast-enhanced CT or MRI for further characterization of this lesion and to exclude renal cell carcinoma. No hydronephrosis in the kidney.  Left Kidney:  Length: 12.1 cm. Echogenicity within normal limits. No mass or hydronephrosis visualized.  Bladder:  Bladder is incompletely distended, limiting evaluation of the bladder wall. No filling defects are demonstrated. Bilateral urine flow jets are shown on color flow Doppler imaging.  IMPRESSION: 3.1 cm diameter solid appearing lesion in the midpole right kidney. Followup with contrast-enhanced CT or MRI for further characterization. No hydronephrosis in either kidney.   Electronically Signed   By: Lucienne Capers M.D.   On: 02/25/2015 02:14    Scheduled Meds: . atorvastatin  20 mg Oral Daily  . heparin  5,000 Units Subcutaneous 3 times per day  . insulin aspart  0-9 Units Subcutaneous TID WC  . insulin aspart  10 Units Subcutaneous Once  . lubiprostone  24 mcg Oral BID WC    Continuous Infusions: .  sodium bicarbonate  infusion 1000 mL 125 mL/hr at 02/25/15 0854     Time spent: 25 minutes  [place] [facility] Pager [phone]. If 7PM-7AM, please contact night-coverage at [email or web address], password [facility] 02/25/2015, 4:02 PM  LOS: 1 day

## 2015-02-25 NOTE — Progress Notes (Signed)
  [facility] Progress Note   Subjective: up on commode, BP's much better, looks better, no new complaints.  K 6.1, creat no better, 350 UOP overnight  Filed Vitals:   02/25/15 0500 02/25/15 0600 02/25/15 0700 02/25/15 0800  BP: 102/57 157/67 135/60   Pulse: 77 80 82   Temp:    98 F (36.7 C)  TempSrc:    Oral  Resp: 20 22 15    Height:    5\' 7"  (1.702 m)  Weight: 95.6 kg (210 lb 12.2 oz)     SpO2: 95% 95% 94%    Exam: Alert, on commode, no distress No jvd Chest clear bilat RRR no MRG Abd soft ntnd no mass or ascites No LE or UE edema Neuro is alert, Ox 3, no asterixis      Assessment: 1. AKI - functional AKI due to severe vol depletion/ ARB's. Doubt ATN.  UA benign, [place] no hydro.  Starting to make a little urine. Should start to recover soon.  Change IVF to bicarb same rate, ok to keep at [facility].  Move to tele bed ok.  2. Hyperkalemia - renal diet, kayex again this am.   3. Hypotension - hold all bp meds for now 4. IBS/ constipation per prim  Plan - as above    Kelly Splinter MD  pager [phone]    cell [phone]  02/25/2015, 9:37 AM     Recent Labs Lab 02/24/15 1356 02/24/15 2000 02/25/15 0336  NA 132* 135 136  K 7.2* 7.3* 6.1*  CL 105 110 112*  CO2 13* 15* 14*  GLUCOSE 144* 97 119*  BUN 99* 99* 99*  CREATININE 9.89* 10.22* 9.73*  CALCIUM 10.0 9.7 9.5    Recent Labs Lab 02/24/15 1356  AST 13*  ALT 16*  ALKPHOS 73  BILITOT 0.3  PROT 8.1  ALBUMIN 4.6    Recent Labs Lab 02/24/15 1356 02/25/15 0336  WBC 7.4 6.5  HGB 14.3 12.9*  HCT 43.9 39.7  MCV 83.5 83.6  PLT 272 223   . atorvastatin  20 mg Oral Daily  . heparin  5,000 Units Subcutaneous 3 times per day  . insulin aspart  0-9 Units Subcutaneous TID WC  . insulin aspart  10 Units Subcutaneous Once  . lubiprostone  24 mcg Oral BID WC   .  sodium bicarbonate  infusion 1000 mL 125 mL/hr at 02/25/15 0854   ondansetron **OR** ondansetron (ZOFRAN) IV

## 2015-02-26 LAB — RENAL FUNCTION PANEL
ANION GAP: 10 (ref 5–15)
Albumin: 3.3 g/dL — ABNORMAL LOW (ref 3.5–5.0)
BUN: 88 mg/dL — ABNORMAL HIGH (ref 6–20)
CO2: 21 mmol/L — ABNORMAL LOW (ref 22–32)
Calcium: 8.9 mg/dL (ref 8.9–10.3)
Chloride: 108 mmol/L (ref 101–111)
Creatinine, Ser: 6.6 mg/dL — ABNORMAL HIGH (ref 0.61–1.24)
GFR calc Af Amer: 9 mL/min — ABNORMAL LOW (ref >60)
GFR calc non Af Amer: 8 mL/min — ABNORMAL LOW (ref >60)
Glucose, Bld: 178 mg/dL — ABNORMAL HIGH (ref 65–99)
Phosphorus: 5.2 mg/dL — ABNORMAL HIGH (ref 2.5–4.6)
Potassium: 3.7 mmol/L (ref 3.5–5.1)
Sodium: 139 mmol/L (ref 135–145)

## 2015-02-26 LAB — GLUCOSE, CAPILLARY
GLUCOSE-CAPILLARY: 166 mg/dL — AB (ref 65–99)
Glucose-Capillary: 167 mg/dL — ABNORMAL HIGH (ref 65–99)
Glucose-Capillary: 152 mg/dL — ABNORMAL HIGH (ref 65–99)
Glucose-Capillary: 183 mg/dL — ABNORMAL HIGH (ref 65–99)

## 2015-02-26 MED ORDER — OXYCODONE HCL 5 MG PO TABS
5.0000 mg | ORAL_TABLET | Freq: Four times a day (QID) | ORAL | Status: DC | PRN
  Administered 2015-02-27 – 2015-03-03 (×12): 5 mg via ORAL
  Filled 2015-02-26 (×12): qty 1

## 2015-02-26 MED ORDER — METOPROLOL TARTRATE 1 MG/ML IV SOLN
5.0000 mg | Freq: Four times a day (QID) | INTRAVENOUS | Status: DC | PRN
  Administered 2015-02-27 – 2015-03-02 (×8): 5 mg via INTRAVENOUS
  Filled 2015-02-26 (×8): qty 5

## 2015-02-26 MED ORDER — ACETAMINOPHEN 325 MG PO TABS
650.0000 mg | ORAL_TABLET | Freq: Four times a day (QID) | ORAL | Status: DC | PRN
  Administered 2015-02-26 – 2015-03-03 (×7): 650 mg via ORAL
  Filled 2015-02-26 (×7): qty 2

## 2015-02-26 MED ORDER — HYDRALAZINE HCL 25 MG PO TABS
25.0000 mg | ORAL_TABLET | Freq: Three times a day (TID) | ORAL | Status: DC
Start: 2015-02-26 — End: 2015-03-04
  Administered 2015-02-26 – 2015-03-04 (×21): 25 mg via ORAL
  Filled 2015-02-26 (×22): qty 1

## 2015-02-26 MED ORDER — AMLODIPINE BESYLATE 10 MG PO TABS
10.0000 mg | ORAL_TABLET | Freq: Every day | ORAL | Status: DC
  Administered 2015-02-26 – 2015-03-02 (×5): 10 mg via ORAL
  Filled 2015-02-26 (×6): qty 1

## 2015-02-26 MED ORDER — METOPROLOL TARTRATE 25 MG PO TABS
25.0000 mg | ORAL_TABLET | Freq: Two times a day (BID) | ORAL | Status: DC
  Administered 2015-02-26 – 2015-02-28 (×6): 25 mg via ORAL
  Filled 2015-02-26 (×6): qty 1

## 2015-02-26 NOTE — Progress Notes (Signed)
PROGRESS NOTE  Noah Barnes [id number] DOB: April 20, 1946 DOA: 02/24/2015 PCP: Unice Cobble, MD  HPI/Recap of past 27 hours: 69 year old male past history of diabetes mellitus, stage II chronic kidney disease and hypertension who said 1 week of poor by mouth intake due to constipation issues because of irritable bowel syndrome along with one episode of vomiting presented on 8/24 with weakness and found to be in acute renal failure with a creatinine of 9 (baseline 1.3) and hyperkalemia with a potassium of 7.2. Patient admitted to hospitalist service and started on IV fluids plus given Kayexalate. Renal ultrasound noted no signs of obstruction, but did incidentally note a 3 cm solid-appearing right renal mass. Patient placed in stepdown.  This morning, after 2 days, patient's potassium down to 3.7 and creatinine finally coming down, currently at 6.6. Patient himself feels markedly better.  Assessment/Plan: Principal Problem:   AKI (acute kidney injury) in the setting of stage II chronic kidney disease: Looks to be hypovolemia while patient on ARB plus diuretic. Seen by nephrology. Started to have significant improvement. Still making urine, no signs of uremia and urinalysis unrevealing. Active Problems:   Essential hypertension: Blood pressure trending up Norvasc, metoprolol and hydralazine started. Irritable bowel syndrome: Bowels moving with Kayexalate   CKD stage 2 due to type 2 diabetes mellitus: Watching blood sugars, sliding scale for now   Chronic diastolic heart failure: Aggressively hydrating. Watching daily weights   Right renal mass: As renal function stabilizes, we will further investigate with CT or MRI    Code Status: Full code  Family Communication: updated  patient's son by phone  Disposition Plan: anticipate discharge on Sunday or Monday    Consultants:  Nephrology  Procedures:  None  Antibiotics:  None   Objective: BP 165/84 mmHg  Pulse 86  Temp(Src)  98.5 F (36.9 C) (Oral)  Resp 18  Ht 5\' 7"  (1.702 m)  Wt 99.746 kg (219 lb 14.4 oz)  BMI 34.43 kg/m2  SpO2 96%  Intake/Output Summary (Last 24 hours) at 02/26/15 1451 Last data filed at 02/26/15 1330  Gross per 24 hour  Intake 3088.33 ml  Output   1825 ml  Net 1263.33 ml   Filed Weights   02/25/15 0500 02/26/15 0523  Weight: 95.6 kg (210 lb 12.2 oz) 99.746 kg (219 lb 14.4 oz)    Exam: unchanged from previous   General:  Alert and oriented 3, no acute distress  Cardiovascular: Regular rate and rhythm, S1-S2  Respiratory: Clear to auscultation bilaterally  Abdomen: Soft, nontender, nondistended, positive bowel sounds  Musculoskeletal: No clubbing or cyanosis or edema   Data Reviewed: Basic Metabolic Panel:  Recent Labs Lab 02/24/15 1356 02/24/15 2000 02/25/15 0336 02/25/15 1500 02/26/15 0505  NA 132* 135 136 139 139  K 7.2* 7.3* 6.1* 5.3* 3.7  CL 105 110 112* 112* 108  CO2 13* 15* 14* 16* 21*  GLUCOSE 144* 97 119* 151* 178*  BUN 99* 99* 99* 95* 88*  CREATININE 9.89* 10.22* 9.73* 9.02* 6.60*  CALCIUM 10.0 9.7 9.5 9.4 8.9  PHOS  --   --   --   --  5.2*   Liver Function Tests:  Recent Labs Lab 02/24/15 1356 02/26/15 0505  AST 13*  --   ALT 16*  --   ALKPHOS 73  --   BILITOT 0.3  --   PROT 8.1  --   ALBUMIN 4.6 3.3*    Recent Labs Lab 02/24/15 1356  LIPASE 79*   No results  for input(s): AMMONIA in the last 168 hours. CBC:  Recent Labs Lab 02/24/15 1356 02/25/15 0336  WBC 7.4 6.5  HGB 14.3 12.9*  HCT 43.9 39.7  MCV 83.5 83.6  PLT 272 223   Cardiac Enzymes:   No results for input(s): CKTOTAL, CKMB, CKMBINDEX, TROPONINI in the last 168 hours. BNP (last 3 results) No results for input(s): BNP in the last 8760 hours.  ProBNP (last 3 results) No results for input(s): PROBNP in the last 8760 hours.  CBG:  Recent Labs Lab 02/25/15 1225 02/25/15 1640 02/25/15 2134 02/26/15 0752 02/26/15 1227  GLUCAP 156* 147* 166* 167* 152*     Recent Results (from the past 240 hour(s))  MRSA PCR Screening     Status: None   Collection Time: 02/24/15  7:00 PM  Result Value Ref Range Status   MRSA by PCR NEGATIVE NEGATIVE Final    Comment:        The GeneXpert MRSA Assay (FDA approved for NASAL specimens only), is one component of a comprehensive MRSA colonization surveillance program. It is not intended to diagnose MRSA infection nor to guide or monitor treatment for MRSA infections.      Studies: No results found.  Scheduled Meds: . amLODipine  10 mg Oral Daily  . atorvastatin  20 mg Oral Daily  . heparin  5,000 Units Subcutaneous 3 times per day  . hydrALAZINE  25 mg Oral 3 times per day  . insulin aspart  0-9 Units Subcutaneous TID WC  . insulin aspart  10 Units Subcutaneous Once  . lubiprostone  24 mcg Oral BID WC  . metoprolol tartrate  25 mg Oral BID    Continuous Infusions: .  sodium bicarbonate  infusion 1000 mL 125 mL/hr at 02/26/15 1136     Time spent: 25 minutes  [place] [facility] Pager [phone]. If 7PM-7AM, please contact night-coverage at [email or web address], password [facility] 02/26/2015, 2:51 PM  LOS: 2 days

## 2015-02-26 NOTE — Progress Notes (Signed)
Patient had runs of SVT, heart rate in the 200s did not sustain, patient denies any chest pain or distress, in bed resting. BP in the 150/68, heart rate in low 100s. heart rate  Dr. [place] Pink notified, will continue to assess patient.

## 2015-02-26 NOTE — Care Management Important Message (Signed)
Important Message  Patient Details  Name: Noah Barnes MRN: [id number] Date of Birth: 08-24-1945   Medicare Important Message Given:  Yes-second notification given    [name] 02/26/2015, 1:25 PMImportant Message  Patient Details  Name: Noah Barnes MRN: [id number] Date of Birth: 06/04/1946   Medicare Important Message Given:  Yes-second notification given    [name] 02/26/2015, 1:25 PM

## 2015-02-26 NOTE — Progress Notes (Signed)
02/25/15 @ 2200Contact was made with oncall Physician regarding patient hypertensive state and BP readings in the [id number]  Systolic. Pt currently had no BP medications in place and were being held d/t previous Hypotn  States.  VO received for 1 x dose of Hydralazine 5 mg IV.

## 2015-02-26 NOTE — Progress Notes (Signed)
  [facility] Progress Note   Subjective: urinating much better. Creat down to 6 today, K ok.   Filed Vitals:   02/25/15 2136 02/25/15 2200 02/25/15 2300 02/26/15 0523  BP: 199/88 174/82 151/91 174/82  Pulse:  96  89  Temp:   98.7 F (37.1 C) 98.5 F (36.9 C)  TempSrc:   Oral Oral  Resp:    18  Height:      Weight:    99.746 kg (219 lb 14.4 oz)  SpO2:    97%   Exam: Alert, on commode, no distress No jvd Chest clear bilat RRR no MRG Abd soft ntnd no mass or ascites No LE or UE edema Neuro is alert, Ox 3, no asterixis      Assessment: 1. AKI - functional AKI due to dehydration/ ARB.  2. Hyperkalemia - renal diet, kayex again this am.   3. Hypotension - dc verapamil, substitute norvasc. Add hydralazine, cont MTP, dc clonidine.  No acei/ ARB's indefinintely due to chronic GI issues and risk of AKI.  Avoid lasix.  4. IBS/ constipation per prim  Plan - have d/w patient, will sign off. Will schedule renal f/u in office and contact patient.     Kelly Splinter MD  pager [phone]    cell [phone]  02/26/2015, 7:36 AM     Recent Labs Lab 02/25/15 0336 02/25/15 1500 02/26/15 0505  NA 136 139 139  K 6.1* 5.3* 3.7  CL 112* 112* 108  CO2 14* 16* 21*  GLUCOSE 119* 151* 178*  BUN 99* 95* 88*  CREATININE 9.73* 9.02* 6.60*  CALCIUM 9.5 9.4 8.9  PHOS  --   --  5.2*    Recent Labs Lab 02/24/15 1356 02/26/15 0505  AST 13*  --   ALT 16*  --   ALKPHOS 73  --   BILITOT 0.3  --   PROT 8.1  --   ALBUMIN 4.6 3.3*    Recent Labs Lab 02/24/15 1356 02/25/15 0336  WBC 7.4 6.5  HGB 14.3 12.9*  HCT 43.9 39.7  MCV 83.5 83.6  PLT 272 223   . atorvastatin  20 mg Oral Daily  . heparin  5,000 Units Subcutaneous 3 times per day  . insulin aspart  0-9 Units Subcutaneous TID WC  . insulin aspart  10 Units Subcutaneous Once  . lubiprostone  24 mcg Oral BID WC   .  sodium bicarbonate  infusion 1000 mL 125 mL/hr at 02/26/15 0240   ondansetron **OR**  ondansetron (ZOFRAN) IV

## 2015-02-27 LAB — BASIC METABOLIC PANEL
Anion gap: 7 (ref 5–15)
BUN: 59 mg/dL — ABNORMAL HIGH (ref 6–20)
CHLORIDE: 101 mmol/L (ref 101–111)
CO2: 35 mmol/L — AB (ref 22–32)
Calcium: 8.7 mg/dL — ABNORMAL LOW (ref 8.9–10.3)
Creatinine, Ser: 3.66 mg/dL — ABNORMAL HIGH (ref 0.61–1.24)
GFR calc Af Amer: 18 mL/min — ABNORMAL LOW (ref >60)
GFR calc non Af Amer: 16 mL/min — ABNORMAL LOW (ref >60)
Glucose, Bld: 192 mg/dL — ABNORMAL HIGH (ref 65–99)
Potassium: 3.2 mmol/L — ABNORMAL LOW (ref 3.5–5.1)
Sodium: 143 mmol/L (ref 135–145)

## 2015-02-27 LAB — GLUCOSE, CAPILLARY
Glucose-Capillary: 177 mg/dL — ABNORMAL HIGH (ref 65–99)
Glucose-Capillary: 211 mg/dL — ABNORMAL HIGH (ref 65–99)
Glucose-Capillary: 232 mg/dL — ABNORMAL HIGH (ref 65–99)
Glucose-Capillary: 190 mg/dL — ABNORMAL HIGH (ref 65–99)

## 2015-02-27 NOTE — Progress Notes (Signed)
PROGRESS NOTE  Noah Barnes [id number] DOB: 09/03/1945 DOA: 02/24/2015 PCP: Unice Cobble, MD  HPI/Recap of past 62 hours: 69 year old male past history of diabetes mellitus, stage II chronic kidney disease and hypertension who said 1 week of poor by mouth intake due to constipation issues because of irritable bowel syndrome along with one episode of vomiting presented on 8/24 with weakness and found to be in acute renal failure with a creatinine of 9 (baseline 1.3) and hyperkalemia with a potassium of 7.2. Patient admitted to hospitalist service and started on IV fluids plus given Kayexalate. Renal ultrasound noted no signs of obstruction, but did incidentally note a 3 cm solid-appearing right renal mass. Patient placed in stepdown.  For the last few days, patient's creatinine continues to trend down. Currently at 3.3. Potassium normalized as of 8/26. Patient himself no complaints.  Assessment/Plan: Principal Problem:   AKI (acute kidney injury) in the setting of stage II chronic kidney disease: Looks to be hypovolemia while patient on ARB plus diuretic. Seen by nephrology. Started to have significant improvement. Still making urine, no signs of uremia and urinalysis unrevealing. Active Problems:   Essential hypertension: Blood pressure trending up Norvasc, metoprolol and hydralazine started. Irritable bowel syndrome: Bowels moving with Kayexalate   CKD stage 2 due to type 2 diabetes mellitus: Watching blood sugars, sliding scale for now   Chronic diastolic heart failure: Aggressively hydrating. Watching daily weights, only up 2 pounds from admission   Right renal mass: As renal function stabilizes, we will further investigate with CT or MRI    Code Status: Full code  Family Communication: updated  patient's wife at the bedside  Disposition Plan: anticipate discharge on Sunday or Monday     Consultants:  Nephrology  Procedures:  None  Antibiotics:  None   Objective: BP 153/77 mmHg  Pulse 102  Temp(Src) 98.4 F (36.9 C) (Oral)  Resp 18  Ht 5\' 7"  (1.702 m)  Wt 96.5 kg (212 lb 11.9 oz)  BMI 33.31 kg/m2  SpO2 96%  Intake/Output Summary (Last 24 hours) at 02/27/15 1354 Last data filed at 02/27/15 1000  Gross per 24 hour  Intake 2816.67 ml  Output   1175 ml  Net 1641.67 ml   Filed Weights   02/25/15 0500 02/26/15 0523 02/27/15 0533  Weight: 95.6 kg (210 lb 12.2 oz) 99.746 kg (219 lb 14.4 oz) 96.5 kg (212 lb 11.9 oz)    Exam:   General:  Alert and oriented 3, no acute distress  Cardiovascular: Regular rate and rhythm, S1-S2  Respiratory: Clear to auscultation bilaterally, no wheezes or crackles  Abdomen: Soft, nontender, nondistended, hypoactive bowel sounds  Musculoskeletal: No clubbing or cyanosis or edema   Data Reviewed: Basic Metabolic Panel:  Recent Labs Lab 02/24/15 2000 02/25/15 0336 02/25/15 1500 02/26/15 0505 02/27/15 0527  NA 135 136 139 139 143  K 7.3* 6.1* 5.3* 3.7 3.2*  CL 110 112* 112* 108 101  CO2 15* 14* 16* 21* 35*  GLUCOSE 97 119* 151* 178* 192*  BUN 99* 99* 95* 88* 59*  CREATININE 10.22* 9.73* 9.02* 6.60* 3.66*  CALCIUM 9.7 9.5 9.4 8.9 8.7*  PHOS  --   --   --  5.2*  --    Liver Function Tests:  Recent Labs Lab 02/24/15 1356 02/26/15 0505  AST 13*  --   ALT 16*  --   ALKPHOS 73  --   BILITOT 0.3  --   PROT 8.1  --   ALBUMIN 4.6  3.3*    Recent Labs Lab 02/24/15 1356  LIPASE 79*   No results for input(s): AMMONIA in the last 168 hours. CBC:  Recent Labs Lab 02/24/15 1356 02/25/15 0336  WBC 7.4 6.5  HGB 14.3 12.9*  HCT 43.9 39.7  MCV 83.5 83.6  PLT 272 223   Cardiac Enzymes:   No results for input(s): CKTOTAL, CKMB, CKMBINDEX, TROPONINI in the last 168 hours. BNP (last 3 results) No results for input(s): BNP in the last 8760 hours.  ProBNP (last 3 results) No results for input(s):  PROBNP in the last 8760 hours.  CBG:  Recent Labs Lab 02/25/15 2134 02/26/15 0752 02/26/15 1227 02/26/15 1642 02/26/15 2109  GLUCAP 166* 167* 152* 183* 166*    Recent Results (from the past 240 hour(s))  MRSA PCR Screening     Status: None   Collection Time: 02/24/15  7:00 PM  Result Value Ref Range Status   MRSA by PCR NEGATIVE NEGATIVE Final    Comment:        The GeneXpert MRSA Assay (FDA approved for NASAL specimens only), is one component of a comprehensive MRSA colonization surveillance program. It is not intended to diagnose MRSA infection nor to guide or monitor treatment for MRSA infections.      Studies: No results found.  Scheduled Meds: . amLODipine  10 mg Oral Daily  . atorvastatin  20 mg Oral Daily  . heparin  5,000 Units Subcutaneous 3 times per day  . hydrALAZINE  25 mg Oral 3 times per day  . insulin aspart  0-9 Units Subcutaneous TID WC  . insulin aspart  10 Units Subcutaneous Once  . lubiprostone  24 mcg Oral BID WC  . metoprolol tartrate  25 mg Oral BID    Continuous Infusions: .  sodium bicarbonate  infusion 1000 mL 125 mL/hr at 02/27/15 0650     Time spent: 15 minutes  [place] [facility] Pager [phone]. If 7PM-7AM, please contact night-coverage at [email or web address], password [facility] 02/27/2015, 1:54 PM  LOS: 3 days

## 2015-02-28 DIAGNOSIS — K589 Irritable bowel syndrome without diarrhea: Secondary | ICD-10-CM

## 2015-02-28 LAB — GLUCOSE, CAPILLARY
GLUCOSE-CAPILLARY: 195 mg/dL — AB (ref 65–99)
Glucose-Capillary: 160 mg/dL — ABNORMAL HIGH (ref 65–99)
Glucose-Capillary: 170 mg/dL — ABNORMAL HIGH (ref 65–99)
Glucose-Capillary: 170 mg/dL — ABNORMAL HIGH (ref 65–99)

## 2015-02-28 LAB — BASIC METABOLIC PANEL
Anion gap: 10 (ref 5–15)
Anion gap: 11 (ref 5–15)
BUN: 29 mg/dL — ABNORMAL HIGH (ref 6–20)
BUN: 23 mg/dL — ABNORMAL HIGH (ref 6–20)
CO2: 40 mmol/L — ABNORMAL HIGH (ref 22–32)
CO2: 34 mmol/L — ABNORMAL HIGH (ref 22–32)
Calcium: 8.1 mg/dL — ABNORMAL LOW (ref 8.9–10.3)
Calcium: 8.2 mg/dL — ABNORMAL LOW (ref 8.9–10.3)
Chloride: 93 mmol/L — ABNORMAL LOW (ref 101–111)
Chloride: 96 mmol/L — ABNORMAL LOW (ref 101–111)
Creatinine, Ser: 2.26 mg/dL — ABNORMAL HIGH (ref 0.61–1.24)
Creatinine, Ser: 1.97 mg/dL — ABNORMAL HIGH (ref 0.61–1.24)
GFR calc Af Amer: 33 mL/min — ABNORMAL LOW (ref >60)
GFR calc Af Amer: 38 mL/min — ABNORMAL LOW (ref >60)
GFR calc non Af Amer: 28 mL/min — ABNORMAL LOW (ref >60)
GFR calc non Af Amer: 33 mL/min — ABNORMAL LOW (ref >60)
GLUCOSE: 184 mg/dL — AB (ref 65–99)
Glucose, Bld: 195 mg/dL — ABNORMAL HIGH (ref 65–99)
Potassium: 3 mmol/L — ABNORMAL LOW (ref 3.5–5.1)
Potassium: 3.3 mmol/L — ABNORMAL LOW (ref 3.5–5.1)
SODIUM: 143 mmol/L (ref 135–145)
Sodium: 141 mmol/L (ref 135–145)

## 2015-02-28 MED ORDER — SODIUM CHLORIDE 0.9 % IV SOLN
INTRAVENOUS | Status: DC
  Administered 2015-02-28 – 2015-03-02 (×5): via INTRAVENOUS

## 2015-02-28 MED ORDER — ALUM & MAG HYDROXIDE-SIMETH 200-200-20 MG/5ML PO SUSP
30.0000 mL | Freq: Four times a day (QID) | ORAL | Status: DC | PRN
  Administered 2015-02-28: 30 mL via ORAL
  Filled 2015-02-28: qty 30

## 2015-02-28 MED ORDER — INSULIN GLARGINE 100 UNIT/ML ~~LOC~~ SOLN
3.0000 [IU] | Freq: Every day | SUBCUTANEOUS | Status: DC
  Administered 2015-02-28 – 2015-03-04 (×5): 3 [IU] via SUBCUTANEOUS
  Filled 2015-02-28 (×5): qty 0.03

## 2015-02-28 MED ORDER — POTASSIUM CHLORIDE CRYS ER 20 MEQ PO TBCR
40.0000 meq | EXTENDED_RELEASE_TABLET | Freq: Once | ORAL | Status: AC
  Administered 2015-02-28: 40 meq via ORAL
  Filled 2015-02-28: qty 2

## 2015-02-28 MED ORDER — METOPROLOL TARTRATE 1 MG/ML IV SOLN
2.5000 mg | Freq: Once | INTRAVENOUS | Status: AC
  Administered 2015-02-28: 2.5 mg via INTRAVENOUS
  Filled 2015-02-28: qty 5

## 2015-02-28 NOTE — Progress Notes (Signed)
Patient remained sinus tachy on the monitor HR 110-130's with PVC's, BP is high. Just had 5 runs of v.tach and earlier had a brief episode of SVT. Patient is asymptomatic. I gave his PRN metoprolol IV. I will continue to monitor patient.

## 2015-02-28 NOTE — Progress Notes (Signed)
PROGRESS NOTE  Noah Barnes [id number] DOB: [birth year] DOA: 02/24/2015 PCP: Unice Cobble, MD  HPI/Recap of past 21 hours: 69 year old male past history of diabetes mellitus, stage II chronic kidney disease and hypertension who said 1 week of poor by mouth intake due to constipation issues because of irritable bowel syndrome along with one episode of vomiting presented on 8/24 with weakness and found to be in acute renal failure with a creatinine of 9 (baseline 1.3) and hyperkalemia with a potassium of 7.2. Patient admitted to hospitalist service and started on IV fluids plus given Kayexalate. Renal ultrasound noted no signs of obstruction, but did incidentally note a 3 cm solid-appearing right renal mass. Patient initially in stepdown, but as renal function improved and creatinine normalized, transfer to floor.  Patient himself doing okay. Complaining of some neck pain to nursing which he feels he slept on it wrong. Creatinine down to 2.26.  Noted some arrhythmia on exam and EKG done noting sinus rhythm with PVCs  Assessment/Plan: Principal Problem:   AKI (acute kidney injury) in the setting of stage II chronic kidney disease: Looks to be hypovolemia while patient on ARB plus diuretic. Seen by nephrology. Should be close to baseline by tomorrow Still making urine, no signs of uremia and urinalysis unrevealing. Active Problems:   Essential hypertension: Blood pressure trending up Norvasc, metoprolol and hydralazine started. Irritable bowel syndrome: Bowels moving with Kayexalate   CKD stage 2 due to type 2 diabetes mellitus: Watching blood sugars, sliding scale for now. Metformin on hold given renal dysfunction. Has started low-dose Lantus   Chronic diastolic heart failure: Aggressively hydrating. Likely will need diuresis once renal function normalized.   Right renal mass: As renal function stabilizes, we will further investigate with CT or MRI , hopefully tomorrow   Code Status: Full  code  Family Communication: updated  patient's son by phone  Disposition Plan: anticipate discharge tomorrow or Tuesday, pending normal renal function, need for diuresis and renal CT    Consultants:  Nephrology  Procedures:  None  Antibiotics:  None   Objective: BP 158/92 mmHg  Pulse 93  Temp(Src) 98.7 F (37.1 C) (Oral)  Resp 20  Ht 5\' 7"  (1.702 m)  Wt 96.8 kg (213 lb 6.5 oz)  BMI 33.42 kg/m2  SpO2 90%  Intake/Output Summary (Last 24 hours) at 02/28/15 1058 Last data filed at 02/28/15 0710  Gross per 24 hour  Intake 3718.75 ml  Output   1450 ml  Net 2268.75 ml   Filed Weights   02/26/15 0523 02/27/15 0533 02/28/15 0500  Weight: 99.746 kg (219 lb 14.4 oz) 96.5 kg (212 lb 11.9 oz) 96.8 kg (213 lb 6.5 oz)    Exam:   General:  Alert and oriented 3, no acute distress  Cardiovascular: Regular rate and rhythm, S1-S2, ectopic beats  Respiratory: Clear to auscultation bilaterally, no wheezes or crackles  Abdomen: Soft, nontender, nondistended, hypoactive bowel sounds  Musculoskeletal: No clubbing or cyanosis or edema   Data Reviewed: Basic Metabolic Panel:  Recent Labs Lab 02/25/15 0336 02/25/15 1500 02/26/15 0505 02/27/15 0527 02/28/15 0600  NA 136 139 139 143 143  K 6.1* 5.3* 3.7 3.2* 3.0*  CL 112* 112* 108 101 93*  CO2 14* 16* 21* 35* 40*  GLUCOSE 119* 151* 178* 192* 195*  BUN 99* 95* 88* 59* 29*  CREATININE 9.73* 9.02* 6.60* 3.66* 2.26*  CALCIUM 9.5 9.4 8.9 8.7* 8.1*  PHOS  --   --  5.2*  --   --  Liver Function Tests:  Recent Labs Lab 02/24/15 1356 02/26/15 0505  AST 13*  --   ALT 16*  --   ALKPHOS 73  --   BILITOT 0.3  --   PROT 8.1  --   ALBUMIN 4.6 3.3*    Recent Labs Lab 02/24/15 1356  LIPASE 79*   No results for input(s): AMMONIA in the last 168 hours. CBC:  Recent Labs Lab 02/24/15 1356 02/25/15 0336  WBC 7.4 6.5  HGB 14.3 12.9*  HCT 43.9 39.7  MCV 83.5 83.6  PLT 272 223   Cardiac Enzymes:   No results  for input(s): CKTOTAL, CKMB, CKMBINDEX, TROPONINI in the last 168 hours. BNP (last 3 results) No results for input(s): BNP in the last 8760 hours.  ProBNP (last 3 results) No results for input(s): PROBNP in the last 8760 hours.  CBG:  Recent Labs Lab 02/27/15 0726 02/27/15 1128 02/27/15 1728 02/27/15 2232 02/28/15 0745  GLUCAP 177* 211* 232* 190* 195*    Recent Results (from the past 240 hour(s))  MRSA PCR Screening     Status: None   Collection Time: 02/24/15  7:00 PM  Result Value Ref Range Status   MRSA by PCR NEGATIVE NEGATIVE Final    Comment:        The GeneXpert MRSA Assay (FDA approved for NASAL specimens only), is one component of a comprehensive MRSA colonization surveillance program. It is not intended to diagnose MRSA infection nor to guide or monitor treatment for MRSA infections.      Studies: No results found.  Scheduled Meds: . amLODipine  10 mg Oral Daily  . atorvastatin  20 mg Oral Daily  . heparin  5,000 Units Subcutaneous 3 times per day  . hydrALAZINE  25 mg Oral 3 times per day  . insulin aspart  0-9 Units Subcutaneous TID WC  . insulin aspart  10 Units Subcutaneous Once  . insulin glargine  3 Units Subcutaneous Daily  . lubiprostone  24 mcg Oral BID WC  . metoprolol tartrate  25 mg Oral BID    Continuous Infusions: . sodium chloride 100 mL/hr at 02/28/15 0901  .  sodium bicarbonate  infusion 1000 mL 125 mL/hr at 02/28/15 0710     Time spent: 25 minutes  [facility] Pager [phone]. If 7PM-7AM, please contact night-coverage at [email or web address], password [facility] 02/28/2015, 10:58 AM  LOS: 4 days

## 2015-03-01 ENCOUNTER — Inpatient Hospital Stay (HOSPITAL_COMMUNITY): Payer: Medicare Other

## 2015-03-01 LAB — BASIC METABOLIC PANEL
ANION GAP: 10 (ref 5–15)
BUN: 19 mg/dL (ref 6–20)
CO2: 30 mmol/L (ref 22–32)
Calcium: 7.9 mg/dL — ABNORMAL LOW (ref 8.9–10.3)
Chloride: 102 mmol/L (ref 101–111)
Creatinine, Ser: 1.82 mg/dL — ABNORMAL HIGH (ref 0.61–1.24)
GFR calc Af Amer: 42 mL/min — ABNORMAL LOW (ref >60)
GFR, EST NON AFRICAN AMERICAN: 36 mL/min — AB (ref >60)
Glucose, Bld: 174 mg/dL — ABNORMAL HIGH (ref 65–99)
POTASSIUM: 3.3 mmol/L — AB (ref 3.5–5.1)
Sodium: 142 mmol/L (ref 135–145)

## 2015-03-01 LAB — GLUCOSE, CAPILLARY
GLUCOSE-CAPILLARY: 176 mg/dL — AB (ref 65–99)
Glucose-Capillary: 152 mg/dL — ABNORMAL HIGH (ref 65–99)
Glucose-Capillary: 168 mg/dL — ABNORMAL HIGH (ref 65–99)
Glucose-Capillary: 144 mg/dL — ABNORMAL HIGH (ref 65–99)

## 2015-03-01 MED ORDER — POTASSIUM CHLORIDE CRYS ER 20 MEQ PO TBCR
40.0000 meq | EXTENDED_RELEASE_TABLET | Freq: Once | ORAL | Status: AC
  Administered 2015-03-01: 40 meq via ORAL
  Filled 2015-03-01: qty 2

## 2015-03-01 MED ORDER — METOPROLOL TARTRATE 50 MG PO TABS
100.0000 mg | ORAL_TABLET | Freq: Two times a day (BID) | ORAL | Status: DC
  Administered 2015-03-01 – 2015-03-04 (×7): 100 mg via ORAL
  Filled 2015-03-01 (×7): qty 2

## 2015-03-01 MED ORDER — METHOCARBAMOL 500 MG PO TABS
500.0000 mg | ORAL_TABLET | ORAL | Status: AC
  Administered 2015-03-01: 500 mg via ORAL
  Filled 2015-03-01: qty 1

## 2015-03-01 MED ORDER — METHOCARBAMOL 500 MG PO TABS
500.0000 mg | ORAL_TABLET | Freq: Three times a day (TID) | ORAL | Status: DC | PRN
  Administered 2015-03-01 – 2015-03-03 (×5): 500 mg via ORAL
  Filled 2015-03-01 (×5): qty 1

## 2015-03-01 NOTE — Progress Notes (Signed)
PROGRESS NOTE  Noah Barnes [id number] DOB: [birth year] DOA: 02/24/2015 PCP: Unice Cobble, MD  HPI/Recap of past 65 hours: 69 year old male past history of diabetes mellitus, stage II chronic kidney disease and hypertension who said 1 week of poor by mouth intake due to constipation issues because of irritable bowel syndrome along with one episode of vomiting presented on 8/24 with weakness and found to be in acute renal failure with a creatinine of 9 (baseline 1.3) and hyperkalemia with a potassium of 7.2. Patient admitted to hospitalist service and started on IV fluids plus given Kayexalate. Renal ultrasound noted no signs of obstruction, but did incidentally note a 3 cm solid-appearing right renal mass. Patient initially in stepdown, but as renal function improved and creatinine normalized, transfer to floor.  Patient okay. Still having some residual neck pain, nothing serious. Creatinine down to 1.8.  Noted some arrhythmia on exam and EKG done noting sinus rhythm with PVCs  Assessment/Plan: Principal Problem:   AKI (acute kidney injury) in the setting of stage II chronic kidney disease: Looks to be hypovolemia while patient on ARB plus diuretic. Seen by nephrology. Hopefully baseline tomorrow Still making urine, no signs of uremia and urinalysis unrevealing. Active Problems:   Essential hypertension: Blood pressure trending up Norvasc, metoprolol and hydralazine started. Irritable bowel syndrome: Bowels moving with Kayexalate   CKD stage 2 due to type 2 diabetes mellitus: Watching blood sugars, sliding scale for now. Metformin on hold given renal dysfunction. Has started low-dose Lantus   Chronic diastolic heart failure: Aggressively hydrating. Likely will need diuresis once renal function normalized. This is likely the cause of patient's tachycardia. Has had to increase his home beta blocker   Right renal mass: Unable to get CT with contrast today due to elevated renal function,  hopefully we'll be able to get tomorrow   Code Status: Full code  Family Communication: Patient's wife at the bedside  Disposition Plan: anticipate discharge Wednesday, pending normal renal function, need for diuresis and renal CT    Consultants:  Nephrology  Procedures:  None  Antibiotics:  None   Objective: BP 158/79 mmHg  Pulse 125  Temp(Src) 98.8 F (37.1 C) (Oral)  Resp 20  Ht 5\' 7"  (1.702 m)  Wt 96.707 kg (213 lb 3.2 oz)  BMI 33.38 kg/m2  SpO2 92%  Intake/Output Summary (Last 24 hours) at 03/01/15 1441 Last data filed at 03/01/15 1335  Gross per 24 hour  Intake   2110 ml  Output   1926 ml  Net    184 ml   Filed Weights   02/27/15 0533 02/28/15 0500 03/01/15 0500  Weight: 96.5 kg (212 lb 11.9 oz) 96.8 kg (213 lb 6.5 oz) 96.707 kg (213 lb 3.2 oz)    Exam: Little change from previous day  General:  Alert and oriented 3, no acute distress  Cardiovascular: Regular rate and rhythm, S1-S2, ectopic beats  Respiratory: Clear to auscultation bilaterally, no wheezes or crackles  Abdomen: Soft, nontender, nondistended, hypoactive bowel sounds  Musculoskeletal: No clubbing or cyanosis or edema   Data Reviewed: Basic Metabolic Panel:  Recent Labs Lab 02/26/15 0505 02/27/15 0527 02/28/15 0600 02/28/15 1600 03/01/15 0508  NA 139 143 143 141 142  K 3.7 3.2* 3.0* 3.3* 3.3*  CL 108 101 93* 96* 102  CO2 21* 35* 40* 34* 30  GLUCOSE 178* 192* 195* 184* 174*  BUN 88* 59* 29* 23* 19  CREATININE 6.60* 3.66* 2.26* 1.97* 1.82*  CALCIUM 8.9 8.7* 8.1* 8.2* 7.9*  PHOS 5.2*  --   --   --   --    Liver Function Tests:  Recent Labs Lab 02/24/15 1356 02/26/15 0505  AST 13*  --   ALT 16*  --   ALKPHOS 73  --   BILITOT 0.3  --   PROT 8.1  --   ALBUMIN 4.6 3.3*    Recent Labs Lab 02/24/15 1356  LIPASE 79*   No results for input(s): AMMONIA in the last 168 hours. CBC:  Recent Labs Lab 02/24/15 1356 02/25/15 0336  WBC 7.4 6.5  HGB 14.3 12.9*    HCT 43.9 39.7  MCV 83.5 83.6  PLT 272 223   Cardiac Enzymes:   No results for input(s): CKTOTAL, CKMB, CKMBINDEX, TROPONINI in the last 168 hours. BNP (last 3 results) No results for input(s): BNP in the last 8760 hours.  ProBNP (last 3 results) No results for input(s): PROBNP in the last 8760 hours.  CBG:  Recent Labs Lab 02/28/15 0745 02/28/15 1131 02/28/15 1734 02/28/15 2207 03/01/15 0726  GLUCAP 195* 160* 170* 170* 152*    Recent Results (from the past 240 hour(s))  MRSA PCR Screening     Status: None   Collection Time: 02/24/15  7:00 PM  Result Value Ref Range Status   MRSA by PCR NEGATIVE NEGATIVE Final    Comment:        The GeneXpert MRSA Assay (FDA approved for NASAL specimens only), is one component of a comprehensive MRSA colonization surveillance program. It is not intended to diagnose MRSA infection nor to guide or monitor treatment for MRSA infections.      Studies: No results found.  Scheduled Meds: . amLODipine  10 mg Oral Daily  . atorvastatin  20 mg Oral Daily  . heparin  5,000 Units Subcutaneous 3 times per day  . hydrALAZINE  25 mg Oral 3 times per day  . insulin aspart  0-9 Units Subcutaneous TID WC  . insulin aspart  10 Units Subcutaneous Once  . insulin glargine  3 Units Subcutaneous Daily  . lubiprostone  24 mcg Oral BID WC  . metoprolol tartrate  100 mg Oral BID  . potassium chloride  40 mEq Oral Once    Continuous Infusions: . sodium chloride 100 mL/hr at 03/01/15 0448  .  sodium bicarbonate  infusion 1000 mL Stopped (02/28/15 0900)     Time spent: 15 minutes  [facility] Pager [phone]. If 7PM-7AM, please contact night-coverage at [email or web address], password [facility] 03/01/2015, 2:41 PM  LOS: 5 days

## 2015-03-01 NOTE — Care Management Important Message (Signed)
Important Message  Patient Details  Name: Noah Barnes MRN: [id number] Date of Birth: 1945/11/29   Medicare Important Message Given:  Yes-third notification given    [name] 03/01/2015, 11:26 AMImportant Message  Patient Details  Name: Noah Barnes MRN: [id number] Date of Birth: [birth year]   Medicare Important Message Given:  Yes-third notification given    [name] 03/01/2015, 11:26 AM

## 2015-03-02 ENCOUNTER — Inpatient Hospital Stay (HOSPITAL_COMMUNITY): Payer: Medicare Other

## 2015-03-02 ENCOUNTER — Ambulatory Visit (HOSPITAL_COMMUNITY): Payer: Medicare Other

## 2015-03-02 ENCOUNTER — Encounter (HOSPITAL_COMMUNITY): Payer: Self-pay | Admitting: Radiology

## 2015-03-02 LAB — BASIC METABOLIC PANEL
Anion gap: 11 (ref 5–15)
BUN: 18 mg/dL (ref 6–20)
CALCIUM: 7.8 mg/dL — AB (ref 8.9–10.3)
CO2: 23 mmol/L (ref 22–32)
CREATININE: 1.64 mg/dL — AB (ref 0.61–1.24)
Chloride: 107 mmol/L (ref 101–111)
GFR calc Af Amer: 48 mL/min — ABNORMAL LOW (ref >60)
GFR calc non Af Amer: 41 mL/min — ABNORMAL LOW (ref >60)
Glucose, Bld: 156 mg/dL — ABNORMAL HIGH (ref 65–99)
Potassium: 3.5 mmol/L (ref 3.5–5.1)
Sodium: 141 mmol/L (ref 135–145)

## 2015-03-02 LAB — GLUCOSE, CAPILLARY
Glucose-Capillary: 158 mg/dL — ABNORMAL HIGH (ref 65–99)
Glucose-Capillary: 191 mg/dL — ABNORMAL HIGH (ref 65–99)
Glucose-Capillary: 141 mg/dL — ABNORMAL HIGH (ref 65–99)
Glucose-Capillary: 164 mg/dL — ABNORMAL HIGH (ref 65–99)

## 2015-03-02 MED ORDER — IOHEXOL 300 MG/ML  SOLN
100.0000 mL | Freq: Once | INTRAMUSCULAR | Status: AC | PRN
  Administered 2015-03-02: 80 mL via INTRAVENOUS

## 2015-03-02 MED ORDER — FUROSEMIDE 10 MG/ML IJ SOLN
20.0000 mg | Freq: Two times a day (BID) | INTRAMUSCULAR | Status: DC
  Administered 2015-03-02 – 2015-03-03 (×2): 20 mg via INTRAVENOUS
  Filled 2015-03-02 (×2): qty 2

## 2015-03-02 NOTE — Progress Notes (Signed)
PROGRESS NOTE  KALED AI [id number] DOB: Apr 18, 1946 DOA: 02/24/2015 PCP: Unice Cobble, MD  HPI/Recap of past 61 hours: 69 year old male past history of diabetes mellitus, stage II chronic kidney disease and hypertension who said 1 week of poor by mouth intake due to constipation issues because of irritable bowel syndrome along with one episode of vomiting presented on 8/24 with weakness and found to be in acute renal failure with a creatinine of 9 (baseline 1.3) and hyperkalemia with a potassium of 7.2. Patient admitted to hospitalist service and started on IV fluids plus given Kayexalate. Renal ultrasound noted no signs of obstruction, but did incidentally note a 3 cm solid-appearing right renal mass. Patient initially in stepdown, but as renal function improved and creatinine normalized, transfer to floor.  Patient okay. Have been trying to aggressively hydrate and today creatinine down to 1.6. Able to get a limited contrast CT for better evaluation of renal mass which was cystic and another lesion which was a kidney stone. Patient has had episodes of ectopic beats and tachycardia difficult to control, likely secondary to volume overload,and Lasix started this evening. Only other issues the patient had several days of   neck pain which he felt was from sleeping on it wrong.  Assessment/Plan: Principal Problem:   AKI (acute kidney injury) in the setting of stage II chronic kidney disease: Looks to be hypovolemia while patient on ARB plus diuretic. Seen by nephrology. We will stop fluids and have started diuretic.  Recheck labs in the morning. Active Problems:   Essential hypertension: Blood pressure trending up Norvasc, metoprolol and hydralazine started. Irritable bowel syndrome: Bowels moving with Kayexalate   CKD stage 2 due to type 2 diabetes mellitus: Watching blood sugars, sliding scale for now. Metformin on hold given renal dysfunction. Has started low-dose Lantus   Chronic  diastolic heart failure: Aggressively hydrating and now have stopped IV fluids and have started diuresis   Right renal mass: Fortunately, no signs of carcinoma.   Code Status: Full code  Family Communication: Patient's wife at the bedside  Disposition Plan: anticipate discharge Wednesday, pending normal renal function, and diuresed   Consultants:  Nephrology  Procedures:  None  Antibiotics:  None   Objective: BP 143/86 mmHg  Pulse 107  Temp(Src) 98.6 F (37 C) (Oral)  Resp 20  Ht 5\' 7"  (1.702 m)  Wt 97.5 kg (214 lb 15.2 oz)  BMI 33.66 kg/m2  SpO2 90%  Intake/Output Summary (Last 24 hours) at 03/02/15 1803 Last data filed at 03/02/15 1100  Gross per 24 hour  Intake   2520 ml  Output    551 ml  Net   1969 ml   Filed Weights   02/28/15 0500 03/01/15 0500 03/02/15 0540  Weight: 96.8 kg (213 lb 6.5 oz) 96.707 kg (213 lb 3.2 oz) 97.5 kg (214 lb 15.2 oz)    Exam:   General:  Alert and oriented 3, no acute distress  Cardiovascular: Regular rhythm, tachycardic, S1-S2, ectopic beats  Respiratory: Clear to auscultation bilaterally, no wheezes or crackles  Abdomen: Soft, nontender, nondistended, hypoactive bowel sounds  Musculoskeletal: No clubbing or cyanosis or edema   Data Reviewed: Basic Metabolic Panel:  Recent Labs Lab 02/26/15 0505 02/27/15 0527 02/28/15 0600 02/28/15 1600 03/01/15 0508 03/02/15 0457  NA 139 143 143 141 142 141  K 3.7 3.2* 3.0* 3.3* 3.3* 3.5  CL 108 101 93* 96* 102 107  CO2 21* 35* 40* 34* 30 23  GLUCOSE 178* 192* 195* 184*  174* 156*  BUN 88* 59* 29* 23* 19 18  CREATININE 6.60* 3.66* 2.26* 1.97* 1.82* 1.64*  CALCIUM 8.9 8.7* 8.1* 8.2* 7.9* 7.8*  PHOS 5.2*  --   --   --   --   --    Liver Function Tests:  Recent Labs Lab 02/24/15 1356 02/26/15 0505  AST 13*  --   ALT 16*  --   ALKPHOS 73  --   BILITOT 0.3  --   PROT 8.1  --   ALBUMIN 4.6 3.3*    Recent Labs Lab 02/24/15 1356  LIPASE 79*   No results for  input(s): AMMONIA in the last 168 hours. CBC:  Recent Labs Lab 02/24/15 1356 02/25/15 0336  WBC 7.4 6.5  HGB 14.3 12.9*  HCT 43.9 39.7  MCV 83.5 83.6  PLT 272 223   Cardiac Enzymes:   No results for input(s): CKTOTAL, CKMB, CKMBINDEX, TROPONINI in the last 168 hours. BNP (last 3 results) No results for input(s): BNP in the last 8760 hours.  ProBNP (last 3 results) No results for input(s): PROBNP in the last 8760 hours.  CBG:  Recent Labs Lab 03/01/15 1645 03/01/15 2113 03/02/15 0740 03/02/15 1202 03/02/15 1718  GLUCAP 168* 144* 158* 191* 141*    Recent Results (from the past 240 hour(s))  MRSA PCR Screening     Status: None   Collection Time: 02/24/15  7:00 PM  Result Value Ref Range Status   MRSA by PCR NEGATIVE NEGATIVE Final    Comment:        The GeneXpert MRSA Assay (FDA approved for NASAL specimens only), is one component of a comprehensive MRSA colonization surveillance program. It is not intended to diagnose MRSA infection nor to guide or monitor treatment for MRSA infections.      Studies: Ct Abdomen Pelvis W Wo Contrast  03/02/2015   CLINICAL DATA:  Evaluate right renal mass on ultrasound  EXAM: CT ABDOMEN AND PELVIS WITHOUT AND WITH CONTRAST  TECHNIQUE: Multidetector CT imaging of the abdomen and pelvis was performed following the standard protocol before and following the bolus administration of intravenous contrast.  CONTRAST:  26mL OMNIPAQUE IOHEXOL 300 MG/ML  SOLN  COMPARISON:  Renal ultrasound dated 02/24/2015  FINDINGS: Lower chest: Trace bilateral pleural effusions. Associated dependent atelectasis in the bilateral lower lobes.  Hepatobiliary: Liver is within normal limits. No suspicious/enhancing hepatic lesions.  Gallbladder sludge. No associated inflammatory changes. No intrahepatic or extrahepatic ductal dilatation.  Pancreas: Within normal limits.  Spleen: Within normal limits.  Adrenals/Urinary Tract: Adrenal glands are within normal  limits.  1.7 cm right upper pole renal cyst (series 4/ image 39). No enhancing renal lesions.  7 mm left lower pole nonobstructing renal calculus (series 2/ image 61). No ureteral or bladder calculi. No hydronephrosis.  Bladder is within normal limits.  Stomach/Bowel: Stomach is within normal limits.  No evidence of bowel obstruction.  Normal appendix.  Colonic diverticulosis, without evidence of diverticulitis.  Vascular/Lymphatic: Atherosclerotic calcifications of the abdominal aorta and branch vessels.  No suspicious abdominopelvic lymphadenopathy.  Reproductive: Prostate is unremarkable.  Other: No abdominopelvic ascites.  Small fat containing right inguinal hernia.  Musculoskeletal: Degenerative changes of the visualized thoracolumbar spine.  IMPRESSION: 1.7 cm right upper pole renal cyst.  No enhancing renal lesions.  7 mm nonobstructing left lower pole renal calculus. No ureteral or bladder calculi. No hydronephrosis.  Additional ancillary findings as above.   Electronically Signed   By: Julian Hy M.D.   On: 03/02/2015  13:59    Scheduled Meds: . amLODipine  10 mg Oral Daily  . atorvastatin  20 mg Oral Daily  . furosemide  20 mg Intravenous Q12H  . heparin  5,000 Units Subcutaneous 3 times per day  . hydrALAZINE  25 mg Oral 3 times per day  . insulin aspart  0-9 Units Subcutaneous TID WC  . insulin aspart  10 Units Subcutaneous Once  . insulin glargine  3 Units Subcutaneous Daily  . lubiprostone  24 mcg Oral BID WC  . metoprolol tartrate  100 mg Oral BID    Continuous Infusions: .  sodium bicarbonate  infusion 1000 mL Stopped (02/28/15 0900)     Time spent: 25 minutes  [place] [facility] Pager [phone]. If 7PM-7AM, please contact night-coverage at [email or web address], password [facility] 03/02/2015, 6:03 PM  LOS: 6 days

## 2015-03-02 NOTE — Care Management Note (Signed)
Case Management Note  Patient Details  Name: DAMIEL WORTHEY MRN: [id number] Date of Birth: 1946/06/20  Subjective/Objective: 69 y/o m admitted w/AKI. For ct to assess for renal mass.From home.                   Action/Plan:d/c plan home.   Expected Discharge Date:                 Expected Discharge Plan:  Home/Self Care  In-House Referral:     Discharge planning Services  CM Consult  Post Acute Care Choice:    Choice offered to:     DME Arranged:    DME Agency:     HH Arranged:    HH Agency:     Status of Service:  In process, will continue to follow  Medicare Important Message Given:  Yes-third notification given Date Medicare IM Given:    Medicare IM give by:    Date Additional Medicare IM Given:    Additional Medicare Important Message give by:     If discussed at [place] of Stay Meetings, dates discussed:    Additional Comments:  Dessa Phi, RN 03/02/2015, 2:29 PM

## 2015-03-02 NOTE — Progress Notes (Signed)
Received post CT scan, v/s checked O2 Sats dropped to 87% on RA, pt not in distress. Placed on O2 @2L  Sats 94%. Will monitor accordingly.

## 2015-03-03 DIAGNOSIS — I5032 Chronic diastolic (congestive) heart failure: Secondary | ICD-10-CM

## 2015-03-03 DIAGNOSIS — E1122 Type 2 diabetes mellitus with diabetic chronic kidney disease: Secondary | ICD-10-CM

## 2015-03-03 DIAGNOSIS — N179 Acute kidney failure, unspecified: Secondary | ICD-10-CM

## 2015-03-03 LAB — GLUCOSE, CAPILLARY
Glucose-Capillary: 144 mg/dL — ABNORMAL HIGH (ref 65–99)
Glucose-Capillary: 174 mg/dL — ABNORMAL HIGH (ref 65–99)
Glucose-Capillary: 134 mg/dL — ABNORMAL HIGH (ref 65–99)
Glucose-Capillary: 143 mg/dL — ABNORMAL HIGH (ref 65–99)

## 2015-03-03 LAB — CBC
HCT: 37.5 % — ABNORMAL LOW (ref 39.0–52.0)
Hemoglobin: 12 g/dL — ABNORMAL LOW (ref 13.0–17.0)
MCH: 27 pg (ref 26.0–34.0)
MCHC: 32 g/dL (ref 30.0–36.0)
MCV: 84.5 fL (ref 78.0–100.0)
Platelets: 196 10*3/uL (ref 150–400)
RBC: 4.44 MIL/uL (ref 4.22–5.81)
RDW: 13.5 % (ref 11.5–15.5)
WBC: 7.8 10*3/uL (ref 4.0–10.5)

## 2015-03-03 LAB — BASIC METABOLIC PANEL
Anion gap: 12 (ref 5–15)
BUN: 21 mg/dL — ABNORMAL HIGH (ref 6–20)
CO2: 21 mmol/L — ABNORMAL LOW (ref 22–32)
Calcium: 8.1 mg/dL — ABNORMAL LOW (ref 8.9–10.3)
Chloride: 107 mmol/L (ref 101–111)
Creatinine, Ser: 1.62 mg/dL — ABNORMAL HIGH (ref 0.61–1.24)
GFR calc Af Amer: 49 mL/min — ABNORMAL LOW (ref >60)
GFR calc non Af Amer: 42 mL/min — ABNORMAL LOW (ref >60)
Glucose, Bld: 148 mg/dL — ABNORMAL HIGH (ref 65–99)
POTASSIUM: 3.4 mmol/L — AB (ref 3.5–5.1)
Sodium: 140 mmol/L (ref 135–145)

## 2015-03-03 MED ORDER — POTASSIUM CHLORIDE CRYS ER 20 MEQ PO TBCR
20.0000 meq | EXTENDED_RELEASE_TABLET | Freq: Once | ORAL | Status: AC
  Administered 2015-03-03: 20 meq via ORAL
  Filled 2015-03-03: qty 1

## 2015-03-03 MED ORDER — FUROSEMIDE 20 MG PO TABS
20.0000 mg | ORAL_TABLET | Freq: Every day | ORAL | Status: DC
  Administered 2015-03-03 – 2015-03-04 (×2): 20 mg via ORAL
  Filled 2015-03-03 (×2): qty 1

## 2015-03-03 MED ORDER — VERAPAMIL HCL ER 120 MG PO TBCR
120.0000 mg | EXTENDED_RELEASE_TABLET | Freq: Two times a day (BID) | ORAL | Status: DC
  Administered 2015-03-03 – 2015-03-04 (×3): 120 mg via ORAL
  Filled 2015-03-03 (×7): qty 1

## 2015-03-03 NOTE — Progress Notes (Signed)
Noah Barnes [id number] DOB: 05-29-1946 DOA: 02/24/2015 PCP: Unice Cobble, MD  HPI/Recap of past 46 hours: 69 year old male past history of diabetes mellitus, stage II chronic kidney disease and hypertension who said 1 week of poor by mouth intake due to constipation issues because of irritable bowel syndrome along with one episode of vomiting presented on 8/24 with weakness and found to be in acute renal failure with a creatinine of 9 (baseline 1.3) and hyperkalemia with a potassium of 7.2. Patient admitted to hospitalist service and started on IV fluids plus given Kayexalate. Renal ultrasound noted no signs of obstruction, but did incidentally note a 3 cm solid-appearing right renal mass. Patient initially in stepdown, but as renal function improved and creatinine normalized, transfer to floor.  Have been trying to aggressively hydrate and today creatinine down to 1.6. Able to get a limited contrast CT for better evaluation of renal mass which was cystic and another lesion which was a kidney stone. Patient has had episodes of ectopic beats and tachycardia difficult to control.   Assessment/Plan:  AKI (acute kidney injury) in the setting of stage II chronic kidney disease: Looks to be hypovolemia while patient on ARB plus diuretic. Seen by nephrology.  Monitor on oral lasix.   Tachycardia; Repeat EKG. Resume verapamil. Continue with metoprolol.  Cardiology consulted.   Essential hypertension:  metoprolol and hydralazine started. Resume verapamil.   Irritable bowel syndrome: Bowels moving with Kayexalate  CKD stage 2 due to type 2 diabetes mellitus: Watching blood sugars, sliding scale for now. Metformin on hold given renal dysfunction. Has started low-dose Lantus  Chronic diastolic heart failure: change iv lasix to 20 mg po daily.  Right renal mass: Fortunately, no signs of carcinoma. Renal cyst on Ct    Code Status: Full code  Family Communication: Patient's  wife at the bedside  Disposition Plan: anticipate discharge Wednesday, pending normal renal function, and diuresed   Consultants:  Nephrology  Procedures:  None  Antibiotics:  None   Subjective; He wants to go home. Feels frustrated.  Denies dyspnea and chest pain.   Objective: BP 155/62 mmHg  Pulse 115  Temp(Src) 98.7 F (37.1 C) (Oral)  Resp 24  Ht 5\' 7"  (1.702 m)  Wt 98.975 kg (218 lb 3.2 oz)  BMI 34.17 kg/m2  SpO2 97%  Intake/Output Summary (Last 24 hours) at 03/03/15 1347 Last data filed at 03/03/15 0900  Gross per 24 hour  Intake    120 ml  Output      0 ml  Net    120 ml   Filed Weights   03/01/15 0500 03/02/15 0540 03/03/15 0612  Weight: 96.707 kg (213 lb 3.2 oz) 97.5 kg (214 lb 15.2 oz) 98.975 kg (218 lb 3.2 oz)    Exam:   General:  Alert and oriented 3, no acute distress  Cardiovascular: Regular rhythm, tachycardic, S1-S2, ectopic beats  Respiratory: Clear to auscultation bilaterally, no wheezes or crackles  Abdomen: Soft, nontender, nondistended, hypoactive bowel sounds  Musculoskeletal: No clubbing or cyanosis or edema   Data Reviewed: Basic Metabolic Panel:  Recent Labs Lab 02/26/15 0505  02/28/15 0600 02/28/15 1600 03/01/15 0508 03/02/15 0457 03/03/15 0520  NA 139  < > 143 141 142 141 140  K 3.7  < > 3.0* 3.3* 3.3* 3.5 3.4*  CL 108  < > 93* 96* 102 107 107  CO2 21*  < > 40* 34* 30 23 21*  GLUCOSE 178*  < > 195* 184*  174* 156* 148*  BUN 88*  < > 29* 23* 19 18 21*  CREATININE 6.60*  < > 2.26* 1.97* 1.82* 1.64* 1.62*  CALCIUM 8.9  < > 8.1* 8.2* 7.9* 7.8* 8.1*  PHOS 5.2*  --   --   --   --   --   --   < > = values in this interval not displayed. Liver Function Tests:  Recent Labs Lab 02/24/15 1356 02/26/15 0505  AST 13*  --   ALT 16*  --   ALKPHOS 73  --   BILITOT 0.3  --   PROT 8.1  --   ALBUMIN 4.6 3.3*    Recent Labs Lab 02/24/15 1356  LIPASE 79*   No results for input(s): AMMONIA in the last 168  hours. CBC:  Recent Labs Lab 02/24/15 1356 02/25/15 0336  WBC 7.4 6.5  HGB 14.3 12.9*  HCT 43.9 39.7  MCV 83.5 83.6  PLT 272 223   Cardiac Enzymes:   No results for input(s): CKTOTAL, CKMB, CKMBINDEX, TROPONINI in the last 168 hours. BNP (last 3 results) No results for input(s): BNP in the last 8760 hours.  ProBNP (last 3 results) No results for input(s): PROBNP in the last 8760 hours.  CBG:  Recent Labs Lab 03/02/15 1202 03/02/15 1718 03/02/15 2121 03/03/15 0757 03/03/15 1142  GLUCAP 191* 141* 164* 144* 174*    Recent Results (from the past 240 hour(s))  MRSA PCR Screening     Status: None   Collection Time: 02/24/15  7:00 PM  Result Value Ref Range Status   MRSA by PCR NEGATIVE NEGATIVE Final    Comment:        The GeneXpert MRSA Assay (FDA approved for NASAL specimens only), is one component of a comprehensive MRSA colonization surveillance program. It is not intended to diagnose MRSA infection nor to guide or monitor treatment for MRSA infections.      Studies: No results found.  Scheduled Meds: . atorvastatin  20 mg Oral Daily  . furosemide  20 mg Oral Daily  . heparin  5,000 Units Subcutaneous 3 times per day  . hydrALAZINE  25 mg Oral 3 times per day  . insulin aspart  0-9 Units Subcutaneous TID WC  . insulin aspart  10 Units Subcutaneous Once  . insulin glargine  3 Units Subcutaneous Daily  . lubiprostone  24 mcg Oral BID WC  . metoprolol tartrate  100 mg Oral BID  . verapamil  120 mg Oral BID    Continuous Infusions:     Time spent: 30 minutes  Ilithyia Titzer, [place]  [facility] Pager [phone]. If 7PM-7AM, please contact night-coverage at [email or web address], password [facility] 03/03/2015, 1:47 PM  LOS: 7 days

## 2015-03-03 NOTE — Progress Notes (Signed)
Tele demonstrates flucuating  Heart rate with activity. Pt out of bed and heart rate increases to 140-150s. No other acute changes in Pt's assessment noted and BP stable. With rest heart rate 90-low hundreds.

## 2015-03-04 ENCOUNTER — Inpatient Hospital Stay (HOSPITAL_COMMUNITY): Payer: Medicare Other

## 2015-03-04 DIAGNOSIS — R000 Tachycardia, unspecified: Secondary | ICD-10-CM

## 2015-03-04 DIAGNOSIS — R011 Cardiac murmur, unspecified: Secondary | ICD-10-CM

## 2015-03-04 LAB — BASIC METABOLIC PANEL
Anion gap: 11 (ref 5–15)
BUN: 26 mg/dL — ABNORMAL HIGH (ref 6–20)
CO2: 22 mmol/L (ref 22–32)
Calcium: 8.4 mg/dL — ABNORMAL LOW (ref 8.9–10.3)
Chloride: 105 mmol/L (ref 101–111)
Creatinine, Ser: 1.6 mg/dL — ABNORMAL HIGH (ref 0.61–1.24)
GFR calc Af Amer: 49 mL/min — ABNORMAL LOW (ref >60)
GFR, EST NON AFRICAN AMERICAN: 43 mL/min — AB (ref >60)
Glucose, Bld: 129 mg/dL — ABNORMAL HIGH (ref 65–99)
POTASSIUM: 3.1 mmol/L — AB (ref 3.5–5.1)
Sodium: 138 mmol/L (ref 135–145)

## 2015-03-04 LAB — GLUCOSE, CAPILLARY
Glucose-Capillary: 131 mg/dL — ABNORMAL HIGH (ref 65–99)
Glucose-Capillary: 259 mg/dL — ABNORMAL HIGH (ref 65–99)

## 2015-03-04 MED ORDER — METOPROLOL TARTRATE 100 MG PO TABS: 100.0000 mg | ORAL_TABLET | Freq: Two times a day (BID) | ORAL | Status: DC

## 2015-03-04 MED ORDER — ALLOPURINOL 300 MG PO TABS: 300.0000 mg | ORAL_TABLET | Freq: Every day | ORAL | Status: DC

## 2015-03-04 MED ORDER — POTASSIUM CHLORIDE CRYS ER 20 MEQ PO TBCR
40.0000 meq | EXTENDED_RELEASE_TABLET | Freq: Once | ORAL | Status: AC
  Administered 2015-03-04: 40 meq via ORAL
  Filled 2015-03-04: qty 2

## 2015-03-04 MED ORDER — FUROSEMIDE 20 MG PO TABS: 20.0000 mg | ORAL_TABLET | Freq: Every day | ORAL | Status: DC | PRN

## 2015-03-04 MED ORDER — HYDRALAZINE HCL 25 MG PO TABS: 25.0000 mg | ORAL_TABLET | Freq: Three times a day (TID) | ORAL | Status: DC

## 2015-03-04 NOTE — Progress Notes (Signed)
  Echocardiogram 2D Echocardiogram has been performed.  Noah Barnes M 03/04/2015, 12:29 PM

## 2015-03-04 NOTE — Discharge Summary (Signed)
Physician Discharge Summary  ZAKHI DUPRE [id number] DOB: Feb 05, 1946 DOA: 02/24/2015  PCP: Unice Cobble, MD  Admit date: 02/24/2015 Discharge date: 03/04/2015  Time spent: 35 minutes  Recommendations for Outpatient Follow-up:  1. Needs B-met to follow renal function 2. Needs further adjustment of Diabetes medications.  3. Needs to follow up with cardiology for further evaluation of wall motion abnormalities.   Discharge Diagnoses:    AKI (acute kidney injury)  Tachycardia , MAT   Essential hypertension   IBS (irritable bowel syndrome)   CKD stage 2 due to type 2 diabetes mellitus   Chronic diastolic heart failure    Discharge Condition: Stable.   Diet recommendation: carb modified.   Filed Weights   03/03/15 0612 03/04/15 0500 03/04/15 0839  Weight: 98.975 kg (218 lb 3.2 oz) 94.2 kg (207 lb 10.8 oz) 94.2 kg (207 lb 10.8 oz)    History of present illness:  Pt is 69 yo male with known HTN, DM type II, IBS with constipation predominance (on Linzess since June 2016), last colonoscopy 03/03/2014 revealed two polyps and diverticulosis, now presenting to [facility] ED with main concern of one week duration of progressive weakness, fatigue, poor oral intake, abd discomfort. Pt explains his discomfort is intermittent and associated with cramps, 7/10 in severity when present, no specific radiation, worse with eating and with no specific alleviating factors. Pt reports compliance with medications, no recent changes in regimen except addition of Linzess for IBF in June 2016. Pt denies chest pain or shortness of breath, no fevers, chills.   In Ed, pt noted to be hemodynamically stable, VSS (initial BP was 79/51 but repeat value indicated normal BP). Blood work notable for Na 132, K 7.2, BUN/Cr 99/9.89. TRH asked to admit to SDU for further evaluation. Nephrologist Dr. Jonnie Finner consulted by ED doctor.   Hospital Course:  69 year old male past history of diabetes mellitus, stage II chronic kidney  disease and hypertension who said 1 week of poor by mouth intake due to constipation issues because of irritable bowel syndrome along with one episode of vomiting presented on 8/24 with weakness and found to be in acute renal failure with a creatinine of 9 (baseline 1.3) and hyperkalemia with a potassium of 7.2. Patient admitted to hospitalist service and started on IV fluids plus given Kayexalate. Renal ultrasound noted no signs of obstruction, but did incidentally note a 3 cm solid-appearing right renal mass. Patient initially in stepdown, but as renal function improved and creatinine normalized, transfer to floor.  Have been trying to aggressively hydrate and today creatinine down to 1.6. Able to get a limited contrast CT for better evaluation of renal mass which was cystic and another lesion which was a kidney stone. Patient has had episodes of ectopic beats and tachycardia difficult to control.   Assessment/Plan:  AKI (acute kidney injury) in the setting of stage II chronic kidney disease: Looks to be hypovolemia while patient on ARB plus diuretic. Seen by nephrology.  Monitor on oral lasix.  Renal function stabilized. Cr at 1.6  Tachycardia; Resume verapamil. Continue with metoprolol.  Cardiology consulted. ECHO with mild wall motion abnormality. Plan for out patient work up.   Essential hypertension: metoprolol and hydralazine started. Resume verapamil.   Irritable bowel syndrome: Bowels moving with Kayexalate  CKD stage 2 due to type 2 diabetes mellitus: Watching blood sugars, sliding scale for now. Metformin on hold given renal dysfunction. Has started low-dose Lantus  Diabetes;  He will be discharge on Invokana. Would not start glipizide  due to risk for hypoglycemia.  Hold janumet due to renal failure. Patient does not want to be discharge on insulin. Advised close follow up with PCP or endocrinologist.   Chronic diastolic heart failure: cardiology recommend to discontinue lasix.   Right renal mass: Fortunately, no signs of carcinoma. Renal cyst on Ct   Procedures:  ECHO; mild wall motion abnormality  Consultations:  Cardiology  nephrology  Discharge Exam: Filed Vitals:   03/04/15 1447  BP:   Pulse: 84  Temp:   Resp:     General: Alert in no distress.  Cardiovascular: S 1, S 2 RRR Respiratory: CTA  Discharge Instructions   Discharge Instructions    Diet - low sodium heart healthy    Complete by:  As directed      Diet - low sodium heart healthy    Complete by:  As directed      Increase activity slowly    Complete by:  As directed      Increase activity slowly    Complete by:  As directed           Current Discharge Medication List    START taking these medications   Details  hydrALAZINE (APRESOLINE) 25 MG tablet Take 1 tablet (25 mg total) by mouth every 8 (eight) hours. Qty: 90 tablet, Refills: 0      CONTINUE these medications which have CHANGED   Details  allopurinol (ZYLOPRIM) 300 MG tablet Take 1 tablet (300 mg total) by mouth daily. Qty: 30 tablet, Refills: 3    furosemide (LASIX) 20 MG tablet Take 1-2 tablets (20-40 mg total) by mouth daily as needed. Qty: 60 tablet, Refills: 3    metoprolol (LOPRESSOR) 100 MG tablet Take 1 tablet (100 mg total) by mouth 2 (two) times daily. Qty: 60 tablet, Refills: 0      CONTINUE these medications which have NOT CHANGED   Details  atorvastatin (LIPITOR) 20 MG tablet TAKE ONE TABLET BY MOUTH ONCE DAILY Qty: 90 tablet, Refills: 0    glucose blood (ONE TOUCH ULTRA TEST) test strip Use to test blood sugar ICD 10 E11.29 Qty: 100 each, Refills: 3    INVOKANA 100 MG TABS tablet TAKE ONE TABLET BY MOUTH ONCE DAILY Qty: 30 tablet, Refills: 3    lubiprostone (AMITIZA) 24 MCG capsule Take 1 capsule (24 mcg total) by mouth 2 (two) times daily with a meal. Qty: 60 capsule, Refills: 3    ONETOUCH DELICA LANCETS FINE MISC TEST AS DIRECTED. Qty: 100 each, Refills: 0    verapamil  (CALAN-SR) 240 MG CR tablet TAKE ONE-HALF TABLET BY MOUTH TWICE DAILY Qty: 90 tablet, Refills: 0      STOP taking these medications     cloNIDine (CATAPRES) 0.1 MG tablet      JANUMET 50-1000 MG per tablet      losartan (COZAAR) 100 MG tablet      Na Sulfate-K Sulfate-Mg Sulf SOLN      sildenafil (VIAGRA) 100 MG tablet      Linaclotide (LINZESS) 290 MCG CAPS capsule        Allergies  Allergen Reactions  . Ace Inhibitors Other (See Comments)    Severe AKI due to ARB + dehydration Aug 2016  . Angiotensin Receptor Blockers Other (See Comments)    Severe AKI due to ARB + dehydration Aug 2016  . Losartan Other (See Comments)    Severe AKI due to ARB + dehydration Aug 2016   Follow-up Information    Follow  up with Unice Cobble, MD. Go on 03/11/2015.   Specialty:  Internal Medicine   Why:  at 3:00pm   Contact information:   [address] [address] [phone]       Follow up with Elayne Snare, MD. Go on 03/26/2015.   Specialty:  Endocrinology   Why:  at 3:15pm   Contact information:   [place] [id number] [phone]       Follow up with Tarri Fuller, PA-C On 03/19/2015.   Specialties:  Physician Assistant, Radiology, Interventional Cardiology   Why:  1:30pm. Cardiology followup   Contact information:   [place]  [phone]        The results of significant diagnostics from this hospitalization (including imaging, microbiology, ancillary and laboratory) are listed below for reference.    Significant Diagnostic Studies: Ct Abdomen Pelvis W Wo Contrast  03/02/2015   CLINICAL DATA:  Evaluate right renal mass on ultrasound  EXAM: CT ABDOMEN AND PELVIS WITHOUT AND WITH CONTRAST  TECHNIQUE: Multidetector CT imaging of the abdomen and pelvis was performed following the standard protocol before and following the bolus administration of intravenous contrast.  CONTRAST:  67m OMNIPAQUE IOHEXOL  300 MG/ML  SOLN  COMPARISON:  Renal ultrasound dated 02/24/2015  FINDINGS: Lower chest: Trace bilateral pleural effusions. Associated dependent atelectasis in the bilateral lower lobes.  Hepatobiliary: Liver is within normal limits. No suspicious/enhancing hepatic lesions.  Gallbladder sludge. No associated inflammatory changes. No intrahepatic or extrahepatic ductal dilatation.  Pancreas: Within normal limits.  Spleen: Within normal limits.  Adrenals/Urinary Tract: Adrenal glands are within normal limits.  1.7 cm right upper pole renal cyst (series 4/ image 39). No enhancing renal lesions.  7 mm left lower pole nonobstructing renal calculus (series 2/ image 61). No ureteral or bladder calculi. No hydronephrosis.  Bladder is within normal limits.  Stomach/Bowel: Stomach is within normal limits.  No evidence of bowel obstruction.  Normal appendix.  Colonic diverticulosis, without evidence of diverticulitis.  Vascular/Lymphatic: Atherosclerotic calcifications of the abdominal aorta and branch vessels.  No suspicious abdominopelvic lymphadenopathy.  Reproductive: Prostate is unremarkable.  Other: No abdominopelvic ascites.  Small fat containing right inguinal hernia.  Musculoskeletal: Degenerative changes of the visualized thoracolumbar spine.  IMPRESSION: 1.7 cm right upper pole renal cyst.  No enhancing renal lesions.  7 mm nonobstructing left lower pole renal calculus. No ureteral or bladder calculi. No hydronephrosis.  Additional ancillary findings as above.   Electronically Signed   By: SJulian HyM.D.   On: 03/02/2015 13:59   [place]  02/25/2015   CLINICAL DATA:  Renal failure.  EXAM: RENAL / URINARY TRACT ULTRASOUND COMPLETE  COMPARISON:  None.  FINDINGS: Right Kidney:  Length: 10.5 cm. Circumscribed cyst in the upper pole measuring about 1.6 cm maximal diameter. Solid-appearing lesion in the midpole measuring 3.1 cm maximal diameter. Recommend follow-up with contrast-enhanced CT or MRI for  further characterization of this lesion and to exclude renal cell carcinoma. No hydronephrosis in the kidney.  Left Kidney:  Length: 12.1 cm. Echogenicity within normal limits. No mass or hydronephrosis visualized.  Bladder:  Bladder is incompletely distended, limiting evaluation of the bladder wall. No filling defects are demonstrated. Bilateral urine flow jets are shown on color flow Doppler imaging.  IMPRESSION: 3.1 cm diameter solid appearing lesion in the midpole right kidney. Followup with contrast-enhanced CT or MRI for further characterization. No hydronephrosis in either kidney.   Electronically Signed  By: Lucienne Capers M.D.   On: 02/25/2015 02:14   Dg Abd 2 Views  02/22/2015   CLINICAL DATA:  Worsening constipation for 2 weeks. Irritable bowel syndrome.  EXAM: ABDOMEN - 2 VIEW  COMPARISON:  None.  FINDINGS: The bowel gas pattern is normal. There is no evidence of free air. An 8 mm radiodensity is seen in the left upper quadrant, suspicious for left renal calculus.  IMPRESSION: Normal bowel gas pattern.  Probable 8 mm left renal calculus.   Electronically Signed   By: Earle Gell M.D.   On: 02/22/2015 16:08    Microbiology: Recent Results (from the past 240 hour(s))  MRSA PCR Screening     Status: None   Collection Time: 02/24/15  7:00 PM  Result Value Ref Range Status   MRSA by PCR NEGATIVE NEGATIVE Final    Comment:        The GeneXpert MRSA Assay (FDA approved for NASAL specimens only), is one component of a comprehensive MRSA colonization surveillance program. It is not intended to diagnose MRSA infection nor to guide or monitor treatment for MRSA infections.      Labs: Basic Metabolic Panel:  Recent Labs Lab 02/26/15 0505  02/28/15 1600 03/01/15 0508 03/02/15 0457 03/03/15 0520 03/04/15 0530  NA 139  < > 141 142 141 140 138  K 3.7  < > 3.3* 3.3* 3.5 3.4* 3.1*  CL 108  < > 96* 102 107 107 105  CO2 21*  < > 34* 30 23 21* 22  GLUCOSE 178*  < > 184* 174* 156*  148* 129*  BUN 88*  < > 23* 19 18 21* 26*  CREATININE 6.60*  < > 1.97* 1.82* 1.64* 1.62* 1.60*  CALCIUM 8.9  < > 8.2* 7.9* 7.8* 8.1* 8.4*  PHOS 5.2*  --   --   --   --   --   --   < > = values in this interval not displayed. Liver Function Tests:  Recent Labs Lab 02/26/15 0505  ALBUMIN 3.3*   No results for input(s): LIPASE, AMYLASE in the last 168 hours. No results for input(s): AMMONIA in the last 168 hours. CBC:  Recent Labs Lab 03/03/15 1820  WBC 7.8  HGB 12.0*  HCT 37.5*  MCV 84.5  PLT 196   Cardiac Enzymes: No results for input(s): CKTOTAL, CKMB, CKMBINDEX, TROPONINI in the last 168 hours. BNP: BNP (last 3 results) No results for input(s): BNP in the last 8760 hours.  ProBNP (last 3 results) No results for input(s): PROBNP in the last 8760 hours.  CBG:  Recent Labs Lab 03/03/15 1142 03/03/15 1704 03/03/15 2108 03/04/15 0741 03/04/15 1218  GLUCAP 174* 134* 143* 131* 259*       Signed:  Tyson Parkison A  [facility] 03/04/2015, 4:18 PM

## 2015-03-04 NOTE — Progress Notes (Signed)
Attempted to make a f/u appointment with Dr. Eduard Clos per MD orders but they were only able to give an appointment for today or for September 23 at 3:15. Stressed the importance of the f/u appointment being in 1 week for a hospital follow up, but the receptionist stated that the MD would not be available until the 23rd. MD made aware of this. She requested that a f/u appointment be made with pt's PCP for 1 week. Will attempt to make this appointment for as soon as possible in week.   Noah Barnes [facility] 03/04/2015 11:38 AM

## 2015-03-04 NOTE — Progress Notes (Signed)
Echo result noted, normal EF however has wall motion abnormality. His EKG is similar to previous EKG with [facility], he had normal stress test previously in 2013 with abnormal EKG. He has been exercising regularly without significant chest discomfort.   Discussed with MD, patient is stable for discharge, will followup with the wall motion abnormality as outpatient. Close outpatient followup arranged. Dr. Tyrell Antonio informed.   Hilbert Corrigan PA Pager: [phone]

## 2015-03-04 NOTE — Care Management Note (Signed)
Case Management Note  Patient Details  Name: Noah Barnes MRN: [id number] Date of Birth: 07-26-1945  Subjective/Objective:  Tachycardia-cardio following. Noted currently no home 02 needed.                  Action/Plan:d/c plan home. No d/c needs or orders.   Expected Discharge Date:               Expected Discharge Plan:  Home/Self Care  In-House Referral:     Discharge planning Services  CM Consult  Post Acute Care Choice:    Choice offered to:     DME Arranged:    DME Agency:     HH Arranged:    HH Agency:     Status of Service:  In process, will continue to follow  Medicare Important Message Given:  Yes-fourth notification given Date Medicare IM Given:    Medicare IM give by:    Date Additional Medicare IM Given:    Additional Medicare Important Message give by:     If discussed at [place] of Stay Meetings, dates discussed:    Additional Comments:  Dessa Phi, RN 03/04/2015, 4:05 PM

## 2015-03-04 NOTE — Consult Note (Addendum)
CARDIOLOGY CONSULT NOTE   Patient ID: Noah Barnes MRN: [id number], DOB/AGE: 12-25-1945   Admit date: 02/24/2015 Date of Consult: 03/04/2015   Primary Physician: Unice Cobble, MD Primary Cardiologist: Dr. Burt Knack  Pt. Profile  pleasant 69 year old with PMH of HTN, HLD, DM, CKD stage II and IBS presented with AKI, had tachycardia after verapmil stopped. Tachycardia improved after verapmill reinitiated. Cardiology consulted for tachycardia  Problem List  Past Medical History  Diagnosis Date  . Hypertension   . Diabetes mellitus   . Hyperlipidemia   . Gout   . ED (erectile dysfunction)   . IBS (irritable bowel syndrome)     Past Surgical History  Procedure Laterality Date  . Colonoscopy  1998    negative; no F/U  . Knee effusion tapped       Allergies  Allergies  Allergen Reactions  . Ace Inhibitors Other (See Comments)    Severe AKI due to ARB + dehydration Aug 2016  . Angiotensin Receptor Blockers Other (See Comments)    Severe AKI due to ARB + dehydration Aug 2016  . Losartan Other (See Comments)    Severe AKI due to ARB + dehydration Aug 2016    HPI   The patient is a pleasant 69 year old with PMH of HTN, HLD, DM, CKD stage II and IBS. He was last seen on 01/12/2012 by Dr. Burt Knack as a referral for EKG changes concerning for inferolateral ischemia. However given his lack of symptom with good exercise ability, a treadmill stress test was ordered which showed EF 57%, normal stress nuclear study without significant ischemia. That was the last time he was seen by cardiology service. According to the patient, he has a known heart murmur and irregular heart rate for a long time. He was previously taking metoprolol and verapamil at home.   Since then, patient continued to be active at home and is denies any recent exertional symptoms including chest pain or shortness breath. He was seen by his primary care physician in July and was placed on a PRN dose of Lasix for LE  swelling and venous insufficiency which he states he has not been taking regularly (despite med rec says he's taking daily).   A few days prior to coming to the hospital during this admission, patient started having weakness, constipation and loss of appetite. He saw his GI doctor will continued Linzess and started on Amitiza for IBS. His symptoms did not improve, prompting the patient to seek medical attention [facility]. On arrival his creatinine was close to 10, which is drastically elevated compared to his previous baseline of 1.3-1.4. His Potassium was >7 and he was given kexalate. It was felt to be due to combination of dehydration and ACEI/ARB. His losartan and lastix was held, and he was aggressively hydrated. Ultrasound was obtained which showed right kidney mass, this was later reconfirmed with limited contrast abdominal CT showed right renal cyst. Nephrology was initially consulted for AKI, later signed off after his AKI started improving with hydration. During this admission, he did have episode of hypotension, clonidine and verapamil were also discontinued. However patient had frequent PACs and tachycardia episode. Eventually verapamil was restarted. Cardiology has been consulted for tachycardia. Of note, per internal medicine, unclear if the tachycardia is related to fluid overload, he was diuresed over the last 2 days and it is now back on a daily dose of Lasix.   Inpatient Medications  . atorvastatin  20 mg Oral Daily  . furosemide  20  mg Oral Daily  . heparin  5,000 Units Subcutaneous 3 times per day  . hydrALAZINE  25 mg Oral 3 times per day  . insulin aspart  0-9 Units Subcutaneous TID WC  . insulin aspart  10 Units Subcutaneous Once  . insulin glargine  3 Units Subcutaneous Daily  . lubiprostone  24 mcg Oral BID WC  . metoprolol tartrate  100 mg Oral BID  . verapamil  120 mg Oral BID    Family History Family History  Problem Relation Age of Onset  . Heart attack  Mother 64  . Prostate cancer Father 38  . Diabetes Father   . Colon cancer Brother   . Stroke Neg Hx   . Irritable bowel syndrome Sister     x 2  . Irritable bowel syndrome Brother   . Irritable bowel syndrome Father      Social History Social History   Social History  . Marital Status: Divorced    Spouse Name: N/A  . Number of Children: 1  . Years of Education: N/A   Occupational History  . retired     [id number]   Social History Main Topics  . Smoking status: Former Smoker -- 0.30 packs/day    Types: Cigarettes    Quit date: 07/04/1991  . Smokeless tobacco: Never Used     Comment: smoked age [age], up to 1/3 ppd; may be less  . Alcohol Use: 1.2 oz/week    2 Glasses of wine per week     Comment:  socially  . Drug Use: No  . Sexual Activity: Not on file   Other Topics Concern  . Not on file   Social History Narrative     Review of Systems  General:  No chills, fever, night sweats or weight changes.  Cardiovascular:  No chest pain, dyspnea on exertion, edema, orthopnea, palpitations, paroxysmal nocturnal dyspnea. Dermatological: No rash, lesions/masses Respiratory: No cough, dyspnea Urologic: No hematuria, dysuria Abdominal:   No nausea, vomiting, diarrhea, bright red blood per rectum, melena, or hematemesis Neurologic:  No visual changes, wkns, changes in mental status. All other systems reviewed and are otherwise negative except as noted above.  Physical Exam  Blood pressure 165/80, pulse 63, temperature 98.2 F (36.8 C), temperature source Oral, resp. rate 15, height 5\' 7"  (1.702 m), weight 207 lb 10.8 oz (94.2 kg), SpO2 94 %.  General: Pleasant, NAD Psych: Normal affect. Neuro: Alert and oriented X 3. Moves all extremities spontaneously. HEENT: Normal  Neck: Supple without bruits or JVD. Lungs:  Resp regular and unlabored, CTA. Heart: RRR no s3, s4. +significant thrill over L chest pain Abdomen: Soft, non-tender, non-distended, BS + x 4.    Extremities: No clubbing, cyanosis or edema. DP/PT/Radials 2+ and equal bilaterally.  Labs  No results for input(s): CKTOTAL, CKMB, TROPONINI in the last 72 hours. Lab Results  Component Value Date   WBC 7.8 03/03/2015   HGB 12.0* 03/03/2015   HCT 37.5* 03/03/2015   MCV 84.5 03/03/2015   PLT 196 03/03/2015    Recent Labs Lab 03/04/15 0530  NA 138  K 3.1*  CL 105  CO2 22  BUN 26*  CREATININE 1.60*  CALCIUM 8.4*  GLUCOSE 129*   Lab Results  Component Value Date   CHOL 122 12/31/2014   HDL 40.60 12/31/2014   LDLCALC 62 12/31/2014   TRIG 99.0 12/31/2014   No results found for: DDIMER  Radiology/Studies  Ct Abdomen Pelvis W Wo Contrast  03/02/2015  CLINICAL DATA:  Evaluate right renal mass on ultrasound  EXAM: CT ABDOMEN AND PELVIS WITHOUT AND WITH CONTRAST  TECHNIQUE: Multidetector CT imaging of the abdomen and pelvis was performed following the standard protocol before and following the bolus administration of intravenous contrast.  CONTRAST:  83mL OMNIPAQUE IOHEXOL 300 MG/ML  SOLN  COMPARISON:  Renal ultrasound dated 02/24/2015  FINDINGS: Lower chest: Trace bilateral pleural effusions. Associated dependent atelectasis in the bilateral lower lobes.  Hepatobiliary: Liver is within normal limits. No suspicious/enhancing hepatic lesions.  Gallbladder sludge. No associated inflammatory changes. No intrahepatic or extrahepatic ductal dilatation.  Pancreas: Within normal limits.  Spleen: Within normal limits.  Adrenals/Urinary Tract: Adrenal glands are within normal limits.  1.7 cm right upper pole renal cyst (series 4/ image 39). No enhancing renal lesions.  7 mm left lower pole nonobstructing renal calculus (series 2/ image 61). No ureteral or bladder calculi. No hydronephrosis.  Bladder is within normal limits.  Stomach/Bowel: Stomach is within normal limits.  No evidence of bowel obstruction.  Normal appendix.  Colonic diverticulosis, without evidence of diverticulitis.   Vascular/Lymphatic: Atherosclerotic calcifications of the abdominal aorta and branch vessels.  No suspicious abdominopelvic lymphadenopathy.  Reproductive: Prostate is unremarkable.  Other: No abdominopelvic ascites.  Small fat containing right inguinal hernia.  Musculoskeletal: Degenerative changes of the visualized thoracolumbar spine.  IMPRESSION: 1.7 cm right upper pole renal cyst.  No enhancing renal lesions.  7 mm nonobstructing left lower pole renal calculus. No ureteral or bladder calculi. No hydronephrosis.  Additional ancillary findings as above.   Electronically Signed   By: Julian Hy M.D.   On: 03/02/2015 13:59   US Renal Port  02/25/2015   CLINICAL DATA:  Renal failure.  EXAM: RENAL / URINARY TRACT ULTRASOUND COMPLETE  COMPARISON:  None.  FINDINGS: Right Kidney:  Length: 10.5 cm. Circumscribed cyst in the upper pole measuring about 1.6 cm maximal diameter. Solid-appearing lesion in the midpole measuring 3.1 cm maximal diameter. Recommend follow-up with contrast-enhanced CT or MRI for further characterization of this lesion and to exclude renal cell carcinoma. No hydronephrosis in the kidney.  Left Kidney:  Length: 12.1 cm. Echogenicity within normal limits. No mass or hydronephrosis visualized.  Bladder:  Bladder is incompletely distended, limiting evaluation of the bladder wall. No filling defects are demonstrated. Bilateral urine flow jets are shown on color flow Doppler imaging.  IMPRESSION: 3.1 cm diameter solid appearing lesion in the midpole right kidney. Followup with contrast-enhanced CT or MRI for further characterization. No hydronephrosis in either kidney.   Electronically Signed   By: Lucienne Capers M.D.   On: 02/25/2015 02:14   Dg Abd 2 Views  02/22/2015   CLINICAL DATA:  Worsening constipation for 2 weeks. Irritable bowel syndrome.  EXAM: ABDOMEN - 2 VIEW  COMPARISON:  None.  FINDINGS: The bowel gas pattern is normal. There is no evidence of free air. An 8 mm radiodensity  is seen in the left upper quadrant, suspicious for left renal calculus.  IMPRESSION: Normal bowel gas pattern.  Probable 8 mm left renal calculus.   Electronically Signed   By: Earle Gell M.D.   On: 02/22/2015 16:08    ECG  NSR with TWI in lateral leads  ASSESSMENT AND PLAN  1. Tachycardia with frequent PACs and possible component MAT on telemetry  - ultimately goal is still rate control with BB and CCB. After restarting verapmil, his HR has significantly improved and less bursts has occurred. Will continue on current medication  - he  has loud thrill on exam, unclear if driven by any structural heart dx, will obtain repeat echo since last one was 2013  - does not seems to need ischemic workup given negative myoview in 2013, good functional ability at home and lack of any CP or SOB.  - likely can be discharged today if no significant finding on echo.  2. Dehydration with AKI and hyperkalemia  - Cr > 9 and K >7 on arrival. Previous Cr 1.3-1.4 range. Symptom improved with hydration  - he is very prong to dehydration, agree with no ACEI or ARB.  - discussed with patient, he states he has not been taking lasix everyday prior to arrival, only as needed. Lasix was started in July for venous insufficiency and swelling. He did not have a prior diagnosis of chronic diastolic HF and he states he never had SOB before even with swelling. He is currently euvolemic on exam. Therefore given risk of recurrent dehydration, consider stop PO lasix.   - leg elevation for venous insufficiency  3. HTN 4. HLD 5. DM 6. IBS  Signed, Almyra Deforest, PA-C 03/04/2015, 9:25 AM The patient is currently without complaints. Denies dyspnea or chest pain. Examined sitting up in chair, soft apical systolic murmur. Rhythm is NSR with runs of atrial bigeminy. If echo is unremarkable he could probably be discharged later today. Agree with assessment and plan as noted above. Warren Danes, MD

## 2015-03-04 NOTE — Care Management Important Message (Signed)
Important Message  Patient Details  Name: ZEIK LOEB MRN: [id number] Date of Birth: 1945-12-29   Medicare Important Message Given:  Yes-fourth notification given    [name] 03/04/2015, 2:28 [place] Message  Patient Details  Name: GRAYSEN DOCHERTY MRN: [id number] Date of Birth: [birth year]   Medicare Important Message Given:  Yes-fourth notification given    [name] 03/04/2015, 2:28 PM

## 2015-03-04 NOTE — Progress Notes (Signed)
Pt ambulated in hall. Pt tolerated ambulating well. HR ranged from 120-140's while ambulating, but mostly maintained in the 120's. Will continue to monitor.   Othella Boyer [facility] 03/04/2015 10:41 AM

## 2015-03-05 ENCOUNTER — Encounter: Payer: Self-pay | Admitting: *Deleted

## 2015-03-11 ENCOUNTER — Encounter: Payer: Self-pay | Admitting: Internal Medicine

## 2015-03-11 ENCOUNTER — Other Ambulatory Visit: Payer: Self-pay | Admitting: Internal Medicine

## 2015-03-11 ENCOUNTER — Other Ambulatory Visit (INDEPENDENT_AMBULATORY_CARE_PROVIDER_SITE_OTHER): Payer: Medicare Other

## 2015-03-11 ENCOUNTER — Ambulatory Visit (INDEPENDENT_AMBULATORY_CARE_PROVIDER_SITE_OTHER): Payer: Medicare Other | Admitting: Internal Medicine

## 2015-03-11 VITALS — BP 118/70 | HR 84 | Temp 98.4°F | Resp 14 | Ht 67.0 in | Wt 210.0 lb

## 2015-03-11 DIAGNOSIS — Z8739 Personal history of other diseases of the musculoskeletal system and connective tissue: Secondary | ICD-10-CM

## 2015-03-11 DIAGNOSIS — Z8639 Personal history of other endocrine, nutritional and metabolic disease: Secondary | ICD-10-CM | POA: Diagnosis not present

## 2015-03-11 DIAGNOSIS — N179 Acute kidney failure, unspecified: Secondary | ICD-10-CM

## 2015-03-11 DIAGNOSIS — N2889 Other specified disorders of kidney and ureter: Secondary | ICD-10-CM

## 2015-03-11 LAB — BASIC METABOLIC PANEL
BUN: 31 mg/dL — ABNORMAL HIGH (ref 6–23)
CO2: 23 mEq/L (ref 19–32)
Calcium: 9.8 mg/dL (ref 8.4–10.5)
Chloride: 108 mEq/L (ref 96–112)
Creatinine, Ser: 1.56 mg/dL — ABNORMAL HIGH (ref 0.40–1.50)
GFR: 57.17 mL/min — AB (ref >60.00)
Glucose, Bld: 133 mg/dL — ABNORMAL HIGH (ref 70–99)
Potassium: 4.5 mEq/L (ref 3.5–5.1)
Sodium: 140 mEq/L (ref 135–145)

## 2015-03-11 LAB — URIC ACID: URIC ACID, SERUM: 4.7 mg/dL (ref 4.0–7.8)

## 2015-03-11 MED ORDER — GLUCOSE BLOOD VI STRP: ORAL_STRIP | Status: DC

## 2015-03-11 NOTE — Progress Notes (Signed)
Pre visit review using our clinic review tool, if applicable. No additional management support is needed unless otherwise documented below in the visit note. 

## 2015-03-11 NOTE — Patient Instructions (Signed)
  Your next office appointment will be determined based upon review of your pending labs. Those written interpretation of the lab results and instructions will be transmitted to you by My Chart  Critical results will be called.   Followup as needed for any active or acute issue. Please report any significant change in your symptoms. 

## 2015-03-11 NOTE — Progress Notes (Signed)
   Subjective:    Patient ID: Noah Barnes, male    DOB: 12-20-1945, 69 y.o.   MRN: [id number]  HPI  His complicated hospital course 8/24-03/04/15 was reviewed. He had acute kidney injury with creatinine up to 9 and potassium 7.2. This was treated aggressively with IV fluids and Kayexalate. At the time of discharge creatinine was 1.6. This was in the context of anorexia with decreased oral intake over several days and nausea vomiting 3. Also he was on metformin and allopurinol . Incidental finding was a 3 cm right renal cyst and renal calculus.  Janumet was discontinued. Antihypertensives therapy was changed to metoprolol, hydralazine, and verapamil.  Since discharge blood pressures ranges from 106/65-138/81.  Off Janumet fasting glucoses have been 140-150. He has a follow-up with Dr. Dwyane Dee later this month.  He was found to have  multifocal atrial tachycardia . Cardiology follow-up for abnormal wall motion has been scheduled.  On 12/31/14 his uric acid was 3.7 on allopurinol 300 mg daily.   Review of Systems   Constipation which was a chronic issue has resolved. He also denies diarrhea.   There is no significant cough, sputum production,hemoptysis, wheezing,or  paroxysmal nocturnal dyspnea.  Unexplained weight loss, abdominal pain, significant dyspepsia, dysphagia, melena, rectal bleeding, or persistently small caliber stools are not present.  Dysuria, pyuria, hematuria, frequency, nocturia or polyuria are denied.     Objective:   Physical Exam Pertinent or positive findings include: He has a lower partial. Heart rhythm is slightly irregular. There is a grade 1 systolic murmur. He has trace edema of the left ankle and 1/2+ on the right.  General appearance :adequately nourished; in no distress.  Eyes: No conjunctival inflammation or scleral icterus is present.  Oral exam:  Lips and gums are healthy appearing.There is no oropharyngeal erythema or exudate noted.  Heart:   S1 and  S2 normal without gallop, click, rub or other extra sounds    Lungs:Chest clear to auscultation; no wheezes, rhonchi,rales ,or rubs present.No increased work of breathing.   Abdomen: bowel sounds normal, soft and non-tender without masses, organomegaly or hernias noted.  No guarding or rebound.   Vascular : all pulses equal ; no bruits present.  Skin:Warm & dry.  Intact without suspicious lesions or rashes ; no tenting   Lymphatic: No lymphadenopathy is noted about the head, neck, axilla.   Neuro: Strength, tone & DTRs normal.     Assessment & Plan:  #1 acute kidney injury; BMET will be rechecked.  #2 History of gout; uric acid will be checked and allopurinol decreased or discontinued because of the kidney issues.  #3 abnormal wall motion and multifocal atrial tachycardia; Cardiology follow-up pending.  #4 diabetes, off Janumet due to the acute kidney injury. Follow-up with Dr. Dwyane Dee

## 2015-03-14 ENCOUNTER — Other Ambulatory Visit: Payer: Self-pay | Admitting: Internal Medicine

## 2015-03-15 ENCOUNTER — Other Ambulatory Visit: Payer: Self-pay | Admitting: Emergency Medicine

## 2015-03-15 MED ORDER — VERAPAMIL HCL ER 240 MG PO TBCR: 120.0000 mg | EXTENDED_RELEASE_TABLET | Freq: Two times a day (BID) | ORAL | Status: DC

## 2015-03-16 MED ORDER — ONETOUCH DELICA LANCETS FINE MISC: Status: DC

## 2015-03-16 NOTE — Telephone Encounter (Signed)
Pt states pharmacy has sent request for his Lancets needing refill today. Inform pt sending electronically to [facility]...Noah Barnes

## 2015-03-19 ENCOUNTER — Ambulatory Visit (INDEPENDENT_AMBULATORY_CARE_PROVIDER_SITE_OTHER): Payer: Medicare Other | Admitting: Physician Assistant

## 2015-03-19 ENCOUNTER — Encounter: Payer: Self-pay | Admitting: Physician Assistant

## 2015-03-19 VITALS — BP 110/60 | HR 71 | Ht 67.0 in | Wt 209.0 lb

## 2015-03-19 DIAGNOSIS — I10 Essential (primary) hypertension: Secondary | ICD-10-CM

## 2015-03-19 DIAGNOSIS — I5032 Chronic diastolic (congestive) heart failure: Secondary | ICD-10-CM

## 2015-03-19 DIAGNOSIS — E782 Mixed hyperlipidemia: Secondary | ICD-10-CM

## 2015-03-19 NOTE — Progress Notes (Signed)
Patient ID: Noah Barnes, male   DOB: Sep 21, 1945, 69 y.o.   MRN: [id number]    Date:  03/19/2015   ID:  Noah Barnes, DOB 1945/12/15, MRN [id number]  PCP:  Unice Cobble, MD  Primary Cardiologist:  Burt Knack   Chief Complaint  Patient presents with  . Hospitalization Follow-up     History of Present Illness: Noah Barnes is a 69 y.o. male with PMH of HTN, HLD, DM, CKD stage II and IBS. He was last seen on 01/12/2012 by Dr. Burt Knack as a referral for EKG changes concerning for inferolateral ischemia. However given his lack of symptom with good exercise ability, a treadmill stress test was ordered which showed EF 57%, normal stress nuclear study without significant ischemia. That was the last time he was seen by cardiology service. According to the patient, he has a known heart murmur and irregular heart rate for a long time. He was previously taking metoprolol and verapamil at home.   Since then, patient continued to be active at home and is denies any recent exertional symptoms including chest pain or shortness breath. He was seen by his primary care physician in July and was placed on a PRN dose of Lasix for LE swelling and venous insufficiency which he states he has not been taking regularly (despite med rec says he's taking daily).   A few days prior to coming to the hospital during this admission, patient started having weakness, constipation and loss of appetite. He saw his GI doctor will continued Linzess and started on Amitiza for IBS. His symptoms did not improve, prompting the patient to seek medical attention [facility] [address] On arrival his creatinine was close to 10, which is drastically elevated compared to his previous baseline of 1.3-1.4. His Potassium was >7 and he was given kexalate. It was felt to be due to combination of dehydration and ACEI/ARB. His losartan and lastix were held, and he was aggressively hydrated. Ultrasound was obtained which showed right kidney mass, this  was later reconfirmed with limited contrast abdominal CT showed right renal cyst. Nephrology was initially consulted for AKI, later signed off after his AKI started improving with hydration. During this admission, he did have episode of hypotension, clonidine and verapamil were also discontinued. However patient had frequent PACs and tachycardia episode. Eventually verapamil was restarted.   Patient presents for posthospital follow-up. He has no particular complaints.  his kidney function was rechecked on September 8 and creatinine was 1.56 with a potassium of 4.5.  He is on verapamil, Lopressor 100 mg twice a day, in hydralazine.  The patient currently denies nausea, vomiting, fever, chest pain, shortness of breath, orthopnea, dizziness, PND, cough, congestion, abdominal pain, hematochezia, melena, lower extremity edema, claudication.  Wt Readings from Last 3 Encounters:  03/19/15 209 lb (94.802 kg)  03/11/15 210 lb (95.255 kg)  03/04/15 207 lb 10.8 oz (94.2 kg)     Past Medical History  Diagnosis Date  . Hypertension   . Diabetes mellitus   . Hyperlipidemia   . Gout   . ED (erectile dysfunction)   . IBS (irritable bowel syndrome)   . Tubular adenoma of colon   . Diverticulosis   . Renal calculus     Current Outpatient Prescriptions  Medication Sig Dispense Refill  . allopurinol (ZYLOPRIM) 300 MG tablet Take 300 mg by mouth daily. 1/2 qd 30 tablet 3  . atorvastatin (LIPITOR) 20 MG tablet TAKE ONE TABLET BY MOUTH ONCE DAILY (Patient taking differently: TAKE 20  MG BY MOUTH ONCE DAILY.) 90 tablet 0  . furosemide (LASIX) 20 MG tablet Take 1-2 tablets (20-40 mg total) by mouth daily as needed. 60 tablet 3  . glucose blood (ONE TOUCH ULTRA TEST) test strip Use to test blood sugar ICD 10 E11.29 100 each 3  . hydrALAZINE (APRESOLINE) 25 MG tablet Take 1 tablet (25 mg total) by mouth every 8 (eight) hours. 90 tablet 0  . INVOKANA 100 MG TABS tablet TAKE ONE TABLET BY MOUTH ONCE DAILY (Patient  taking differently: TAKE 100 MG BY MOUTH ONCE DAILY.) 30 tablet 3  . lubiprostone (AMITIZA) 24 MCG capsule Take 1 capsule (24 mcg total) by mouth 2 (two) times daily with a meal. 60 capsule 3  . metoprolol (LOPRESSOR) 100 MG tablet Take 1 tablet (100 mg total) by mouth 2 (two) times daily. 60 tablet 0  . ONETOUCH DELICA LANCETS FINE MISC Use to help check blood sugar twice a day Dx E11.9 100 each 3  . verapamil (CALAN-SR) 240 MG CR tablet Take 0.5 tablets (120 mg total) by mouth 2 (two) times daily. 90 tablet 2   No current facility-administered medications for this visit.    Allergies:    Allergies  Allergen Reactions  . Ace Inhibitors Other (See Comments)    Severe AKI due to ARB + dehydration Aug 2016  . Angiotensin Receptor Blockers Other (See Comments)    Severe AKI due to ARB + dehydration Aug 2016  . Losartan Other (See Comments)    Severe AKI due to ARB + dehydration Aug 2016    Social History:  The patient  reports that he quit smoking about 23 years ago. His smoking use included Cigarettes. He smoked 0.30 packs per day. He has never used smokeless tobacco. He reports that he drinks about 1.2 oz of alcohol per week. He reports that he does not use illicit drugs.   Family history:   Family History  Problem Relation Age of Onset  . Heart attack Mother 22  . Prostate cancer Father 69  . Diabetes Father   . Colon cancer Brother   . Stroke Neg Hx   . Irritable bowel syndrome Sister     x 2  . Irritable bowel syndrome Brother   . Irritable bowel syndrome Father   . Hypertension Mother     ROS:  Please see the history of present illness.  All other systems reviewed and negative.   PHYSICAL EXAM: VS:  BP 110/60 mmHg  Pulse 71  Ht 5\' 7"  (1.702 m)  Wt 209 lb (94.802 kg)  BMI 32.73 kg/m2  SpO2 94% obese, well developed, in no acute distress HEENT: Pupils are equal round react to light accommodation extraocular movements are intact.  Neck: no JVDNo cervical  lymphadenopathy. Cardiac: Regular rate and rhythm with 1/6 systolic murmur Lungs:  clear to auscultation bilaterally, no wheezing, rhonchi or rales Abd: soft, nontender, positive bowel sounds all quadrants, no hepatosplenomegaly Ext: no lower extremity edema.  2+ radial and dorsalis pedis pulses. Skin: warm and dry Neuro:  Grossly normal  Lipid Panel     Component Value Date/Time   CHOL 122 12/31/2014 0900   TRIG 99.0 12/31/2014 0900   HDL 40.60 12/31/2014 0900   CHOLHDL 3 12/31/2014 0900   VLDL 19.8 12/31/2014 0900   LDLCALC 62 12/31/2014 0900   LDLDIRECT 80.1 04/15/2014 0953     ASSESSMENT AND PLAN:  Problem List Items Addressed This Visit    HYPERLIPIDEMIA   Essential hypertension  Chronic diastolic heart failure - Primary (Chronic)     Chronic diastolic heart failure: Patient appears euvolemic. We talked about weight monitoring and low-sodium diet. He takes Lasix as needed.  Essential hypertension: Blood pressure well controlled on Lopressor and hydralazine  Hyperlipidemia: Continue Lipitor 20 mg.  Last lipid panel 12/31/2014 well-controlled see above

## 2015-03-19 NOTE — Patient Instructions (Signed)
Medication Instructions:  Your physician recommends that you continue on your current medications as directed. Please refer to the Current Medication list given to you today.   Labwork: None ordered  Testing/Procedures: None ordered  Follow-Up: Your physician wants you to follow-up in: [place].  You will receive a reminder letter in the mail two months in advance. If you don't receive a letter, please call our office to schedule the follow-up appointment.

## 2015-03-26 ENCOUNTER — Other Ambulatory Visit: Payer: Self-pay | Admitting: *Deleted

## 2015-03-26 ENCOUNTER — Ambulatory Visit (INDEPENDENT_AMBULATORY_CARE_PROVIDER_SITE_OTHER): Payer: Medicare Other | Admitting: Endocrinology

## 2015-03-26 ENCOUNTER — Other Ambulatory Visit (INDEPENDENT_AMBULATORY_CARE_PROVIDER_SITE_OTHER): Payer: Medicare Other

## 2015-03-26 ENCOUNTER — Encounter: Payer: Self-pay | Admitting: Endocrinology

## 2015-03-26 VITALS — BP 124/66 | HR 66 | Temp 98.7°F | Resp 16 | Ht 68.0 in | Wt 208.8 lb

## 2015-03-26 DIAGNOSIS — N289 Disorder of kidney and ureter, unspecified: Secondary | ICD-10-CM

## 2015-03-26 DIAGNOSIS — IMO0002 Reserved for concepts with insufficient information to code with codable children: Secondary | ICD-10-CM

## 2015-03-26 DIAGNOSIS — E1165 Type 2 diabetes mellitus with hyperglycemia: Secondary | ICD-10-CM | POA: Diagnosis not present

## 2015-03-26 DIAGNOSIS — E119 Type 2 diabetes mellitus without complications: Secondary | ICD-10-CM

## 2015-03-26 LAB — BASIC METABOLIC PANEL
BUN: 49 mg/dL — ABNORMAL HIGH (ref 6–23)
CALCIUM: 10 mg/dL (ref 8.4–10.5)
CO2: 21 mEq/L (ref 19–32)
Chloride: 106 mEq/L (ref 96–112)
Creatinine, Ser: 1.72 mg/dL — ABNORMAL HIGH (ref 0.40–1.50)
GFR: 51.08 mL/min — ABNORMAL LOW (ref >60.00)
Glucose, Bld: 139 mg/dL — ABNORMAL HIGH (ref 70–99)
Potassium: 4.3 mEq/L (ref 3.5–5.1)
SODIUM: 137 meq/L (ref 135–145)

## 2015-03-26 MED ORDER — GLIMEPIRIDE 2 MG PO TABS: 2.0000 mg | ORAL_TABLET | Freq: Every day | ORAL | Status: DC

## 2015-03-26 NOTE — Progress Notes (Signed)
Patient ID: Noah Barnes, male   DOB: 03-09-1946, 69 y.o.   MRN: [id number]    Reason for Appointment : F/u for Type 2 Diabetes  History of Present Illness          Diagnosis: Type 2 diabetes mellitus, date of diagnosis: 2008       Past history: His diabetes was borderline in the beginning and not clear what medications he was started on. Has been taking metformin for several years and this was later changed to [place]. Amaryl probably started in 5/14 when blood sugars were higher, initially was given 1 mg and now has been taking 2 mg. He had a relatively high A1c of 7.1, difficulty losing weight and a glucose of 202 on his initial consultation in 06/2013 Since then he has been on Invokana in addition to his Janumet and Amaryl Invokana was reduced to 100 mg in early 2015 been creatinine had gone up to 1.6 His Amaryl was stopped in 6/15 because of tendency to occasional hypoglycemia  Recent history:  He was on a regimen of Janumet and Invokana 100 mg with excellent control Recently he was hospitalized for dehydration and relatively low blood pressure and his Janumet was stopped because of renal dysfunction which was severe However he has been sent home on Invokana despite his renal function not being normal With this his blood sugars have started worsening as shown below His fasting blood sugars are relatively higher with one reading over 200       Oral hypoglycemic drugs the patient is taking are:  Invokana 100 mg   Side effects from medications have been:  none  Glucose monitoring:  done 1.4 times a day         Glucometer: One Touch   Blood Glucose readings recently   Mean values apply above for all meters except median for One Touch  PRE-MEAL Fasting Lunch Dinner Bedtime Overall  Glucose range:  157-205    110-150   149-198   110-217   Mean/median:  180      160     Hypoglycemia: none  Glycemic control:   Lab Results  Component Value Date   HGBA1C 6.4  12/31/2014   HGBA1C 7.1* 09/22/2014   HGBA1C 6.6* 04/15/2014   Lab Results  Component Value Date   MICROALBUR 5.2* 01/05/2015   LDLCALC 62 12/31/2014   CREATININE 1.72* 03/26/2015    Self-care: The diet that the patient has been following has improved since he saw the nutritionist and his wife is cooking better, more vegetables in diet Exercise:  walking 3/7 days a week    Dietician consultations: 08/2013              Retinal exam: Most recent: 2013  Weight history:   Wt Readings from Last 3 Encounters:  03/26/15 208 lb 12.8 oz (94.711 kg)  03/19/15 209 lb (94.802 kg)  03/11/15 210 lb (95.255 kg)         Medication List       This list is accurate as of: 03/26/15 11:59 PM.  Always use your most recent med list.               allopurinol 300 MG tablet  Commonly known as:  ZYLOPRIM  Take 300 mg by mouth daily. 1/2 qd     atorvastatin 20 MG tablet  Commonly known as:  LIPITOR  TAKE ONE TABLET BY MOUTH ONCE DAILY     furosemide 20 MG tablet  Commonly known as:  LASIX  Take 1-2 tablets (20-40 mg total) by mouth daily as needed.     glimepiride 2 MG tablet  Commonly known as:  AMARYL  Take 1 tablet (2 mg total) by mouth daily before breakfast.     glucose blood test strip  Commonly known as:  ONE TOUCH ULTRA TEST  Use to test blood sugar ICD 10 E11.29     hydrALAZINE 25 MG tablet  Commonly known as:  APRESOLINE  Take 1 tablet (25 mg total) by mouth every 8 (eight) hours.     INVOKANA 100 MG Tabs tablet  Generic drug:  canagliflozin  TAKE ONE TABLET BY MOUTH ONCE DAILY     lubiprostone 24 MCG capsule  Commonly known as:  AMITIZA  Take 1 capsule (24 mcg total) by mouth 2 (two) times daily with a meal.     metoprolol 100 MG tablet  Commonly known as:  LOPRESSOR  Take 1 tablet (100 mg total) by mouth 2 (two) times daily.     ONETOUCH DELICA LANCETS FINE Misc  Use to help check blood sugar twice a day Dx E11.9     verapamil 240 MG CR tablet  Commonly  known as:  CALAN-SR  Take 0.5 tablets (120 mg total) by mouth 2 (two) times daily.        Allergies:  Allergies  Allergen Reactions  . Ace Inhibitors Other (See Comments)    Severe AKI due to ARB + dehydration Aug 2016  . Angiotensin Receptor Blockers Other (See Comments)    Severe AKI due to ARB + dehydration Aug 2016  . Losartan Other (See Comments)    Severe AKI due to ARB + dehydration Aug 2016    Past Medical History  Diagnosis Date  . Hypertension   . Diabetes mellitus   . Hyperlipidemia   . Gout   . ED (erectile dysfunction)   . IBS (irritable bowel syndrome)   . Tubular adenoma of colon   . Diverticulosis   . Renal calculus     Past Surgical History  Procedure Laterality Date  . Colonoscopy  1998    negative; no F/U  . Knee effusion tapped      Family History  Problem Relation Age of Onset  . Heart attack Mother 22  . Prostate cancer Father 63  . Diabetes Father   . Colon cancer Brother   . Stroke Neg Hx   . Irritable bowel syndrome Sister     x 2  . Irritable bowel syndrome Brother   . Irritable bowel syndrome Father   . Hypertension Mother     Social History:  reports that he quit smoking about 23 years ago. His smoking use included Cigarettes. He smoked 0.30 packs per day. He has never used smokeless tobacco. He reports that he drinks about 1.2 oz of alcohol per week. He reports that he does not use illicit drugs.    Review of Systems   RENAL dysfunction: He was seen by his PCP after discharge from the hospital for dehydration and renal function is still abnormal earlier this month.      Lipids: Has been on pravastatin for hyperlipidemia, baseline LDL? 162; labs as follows  Lab Results  Component Value Date   CHOL 122 12/31/2014   HDL 40.60 12/31/2014   LDLCALC 62 12/31/2014   LDLDIRECT 80.1 04/15/2014   TRIG 99.0 12/31/2014   CHOLHDL 3 12/31/2014       The blood pressure has been  high for several years and usually well controlled.    Blood pressure appears lower than usual, does not complain of being lightheaded Currently taking verapamil, hydralazine and metoprolol.  Also on Lasix 20 mg     LABS:  Appointment on 03/26/2015  Component Date Value Ref Range Status  . Sodium 03/26/2015 137  135 - 145 mEq/L Final  . Potassium 03/26/2015 4.3  3.5 - 5.1 mEq/L Final  . Chloride 03/26/2015 106  96 - 112 mEq/L Final  . CO2 03/26/2015 21  19 - 32 mEq/L Final  . Glucose, Bld 03/26/2015 139* 70 - 99 mg/dL Final  . BUN 03/26/2015 49* 6 - 23 mg/dL Final  . Creatinine, Ser 03/26/2015 1.72* 0.40 - 1.50 mg/dL Final  . Calcium 03/26/2015 10.0  8.4 - 10.5 mg/dL Final  . GFR 03/26/2015 51.08* >60.00 mL/min Final    Physical Examination:  BP 124/66 mmHg  Pulse 66  Temp(Src) 98.7 F (37.1 C)  Resp 16  Ht 5\' 8"  (1.727 m)  Wt 208 lb 12.8 oz (94.711 kg)  BMI 31.76 kg/m2  SpO2 96%   No pedal edema  ASSESSMENT/PLAN:   Diabetes type 2, uncontrolled with obesity, BMI 34    His blood sugars are significantly higher with stopping Janumet Previously had excellent control and A1c of 6.4  His blood sugars are mostly higher in the morning although he is not checking enough postprandial readings  Because of his renal dysfunction of unclear etiology he may not be able to take Invokana Also will need to recheck his kidney function today to decide on further treatment  For now will have him start Amaryl, stop Invokana and start low dose Janumet in the evening to help with overnight hypoglycemia   Hypertension: Appears to be low normal now especially with his dehydration. With stopping Invokana blood pressure may be more normal   Patient Instructions  Stop Invokana and start Janumet 1 daily at dinner  Glimeperide 2 mg at dinner  Check blood sugars on waking up .. 3 .. times a week Also check blood sugars about 2 hours after a meal and do this after different meals by rotation Recommended blood sugar levels on waking up is  90-130 and about 2 hours after meal is 140-180 Please bring blood sugar monitor to each visit.      KUMAR,AJAY 03/28/2015, 6:53 PM   Addendum: Creatinine clearance 51, will maintain above regimen, need short-term follow-up

## 2015-03-26 NOTE — Patient Instructions (Addendum)
Stop Invokana and start Janumet 1 daily at dinner  Glimeperide 2 mg at dinner  Check blood sugars on waking up .. 3 .. times a week Also check blood sugars about 2 hours after a meal and do this after different meals by rotation Recommended blood sugar levels on waking up is 90-130 and about 2 hours after meal is 140-180 Please bring blood sugar monitor to each visit.

## 2015-03-28 NOTE — Progress Notes (Signed)
Quick Note:  Please let patient know that the kidney test is slightly worse, can continue Janumet once a day. He needs to take furosemide for fluid only as needed for swelling and not daily ______

## 2015-04-01 ENCOUNTER — Other Ambulatory Visit: Payer: Self-pay | Admitting: Emergency Medicine

## 2015-04-01 ENCOUNTER — Telehealth: Payer: Self-pay | Admitting: *Deleted

## 2015-04-01 MED ORDER — METOPROLOL TARTRATE 100 MG PO TABS: 100.0000 mg | ORAL_TABLET | Freq: Two times a day (BID) | ORAL | Status: DC

## 2015-04-01 MED ORDER — HYDRALAZINE HCL 25 MG PO TABS: 25.0000 mg | ORAL_TABLET | Freq: Three times a day (TID) | ORAL | Status: DC

## 2015-04-01 NOTE — Telephone Encounter (Signed)
Receive call from nurse case manager wanting to clarify if patient suppose to be taking clonidine & ASA. On pt hosp discharge pt was suppose to D/C clonidine,. Inform her there wasn't anything noted about taking aspirin. Will clarify with md to see if he suppose to be taking, and contact pt...Noah Barnes

## 2015-04-01 NOTE — Telephone Encounter (Signed)
Notified pt with md response. He stated that he is already taking both Aspirin & clonidine. BP has been running 132/70. Updated med list.../lmb

## 2015-04-01 NOTE — Telephone Encounter (Signed)
He can stay off the clonidine if his blood pressure averages less than 135/85 off medication. I would recommend continuing the baby coated aspirin after breakfast.

## 2015-04-12 ENCOUNTER — Ambulatory Visit (INDEPENDENT_AMBULATORY_CARE_PROVIDER_SITE_OTHER): Payer: Medicare Other | Admitting: Internal Medicine

## 2015-04-12 ENCOUNTER — Encounter: Payer: Self-pay | Admitting: Internal Medicine

## 2015-04-12 VITALS — BP 122/76 | HR 48 | Ht 67.5 in | Wt 218.0 lb

## 2015-04-12 DIAGNOSIS — K589 Irritable bowel syndrome without diarrhea: Secondary | ICD-10-CM

## 2015-04-12 DIAGNOSIS — Z800 Family history of malignant neoplasm of digestive organs: Secondary | ICD-10-CM | POA: Diagnosis not present

## 2015-04-12 DIAGNOSIS — Z86010 Personal history of colonic polyps: Secondary | ICD-10-CM | POA: Diagnosis not present

## 2015-04-12 DIAGNOSIS — K5909 Other constipation: Secondary | ICD-10-CM

## 2015-04-12 DIAGNOSIS — K5900 Constipation, unspecified: Secondary | ICD-10-CM | POA: Diagnosis not present

## 2015-04-12 MED ORDER — LINACLOTIDE 290 MCG PO CAPS: 290.0000 ug | ORAL_CAPSULE | Freq: Every day | ORAL | Status: DC | PRN

## 2015-04-12 MED ORDER — LUBIPROSTONE 24 MCG PO CAPS: 24.0000 ug | ORAL_CAPSULE | Freq: Two times a day (BID) | ORAL | Status: DC

## 2015-04-12 NOTE — Progress Notes (Signed)
Subjective:    Patient ID: Noah Barnes, male    DOB: 09-28-1945, 69 y.o.   MRN: [id number]  HPI Mr. Lindig is a 69 year old male with past medical history of IBS with constipation predominance, adenomatous colon polyps, diverticulosis, and history of colon cancer in brother who was diagnosed at age 50 views here for follow-up. He was last seen in our office by Alonza Bogus, PA-C on 02/22/2015. Unfortunately 2 days later he was hospitalized and found to be in significant acute renal failure. Was hospitalized for 8 days. Fortunately kidney function recovered, his ARB and diuretic was held and changed. He felt like this episode was precipitated by the inability to move his bowels, constipation and eventual dehydration. Fortunately he did not require dialysis. He was seen by nephrology, cardiology and has followed up with primary care and endocrinology. CT scan was performed during hospitalization. This is been reviewed. The bowel from stomach to colon appeared unremarkable, other than diverticulosis without diverticulitis.   He is now feeling better. He is using Amitiza 24 g twice a day. Still having some constipation less than one day per week. He is using Linzess 290 g approximately 1 day a week or less in order to "clean out". He was previously taken Linzess on a daily basis and when used daily it lost efficacy. When using it sporadically it works very well. He denies abdominal pain and pressure today. No rectal bleeding or melena. Appetite has returned. No nausea or vomiting.  Review of Systems As per history of present illness, otherwise negative  Current Medications, Allergies, Past Medical History, Past Surgical History, Family History and Social History were reviewed in [employer] record.     Objective:   Physical Exam BP 122/76 mmHg  Pulse 48  Ht 5' 7.5" (1.715 m)  Wt 218 lb (98.884 kg)  BMI 33.62 kg/m2 Constitutional: Well-developed and well-nourished. No  distress. HEENT: Normocephalic and atraumatic.Conjunctivae are normal.  No scleral icterus. Neck: Neck supple. Trachea midline. Cardiovascular: Normal rate, regular rhythm and intact distal pulses.2/6 sem Pulmonary/chest: Effort normal and breath sounds normal. No wheezing, rales or rhonchi. Abdominal: Soft, nontender, nondistended. Bowel sounds active throughout.  Extremities: no clubbing, cyanosis, or edema Neurological: Alert and oriented to person place and time. Psychiatric: Behavior is normal.  CMP     Component Value Date/Time   NA 137 03/26/2015 1630   K 4.3 03/26/2015 1630   CL 106 03/26/2015 1630   CO2 21 03/26/2015 1630   GLUCOSE 139* 03/26/2015 1630   BUN 49* 03/26/2015 1630   CREATININE 1.72* 03/26/2015 1630   CALCIUM 10.0 03/26/2015 1630   PROT 8.1 02/24/2015 1356   ALBUMIN 3.3* 02/26/2015 0505   AST 13* 02/24/2015 1356   ALT 16* 02/24/2015 1356   ALKPHOS 73 02/24/2015 1356   BILITOT 0.3 02/24/2015 1356   GFRNONAA 43* 03/04/2015 0530   GFRAA 49* 03/04/2015 0530      Assessment & Plan:  69 year old male with past medical history of IBS with constipation predominance, adenomatous colon polyps, diverticulosis, and history of colon cancer in brother who was diagnosed at age 67 views here for follow-up.   1. Chronic constipation -- Amitiza is working better on a daily basis. We will continue 24 g twice a day. Linzess lost efficacy but works when necessary. I'm okay if he is using Linzess in place of Amitiza a few days per month. He knows not to take both laxative on the same day. Hopefully this combination will help  prevent severe constipation. He is asked to notify me if constipation is worsening.  2. Acute kidney injury -- severe requiring hospitalization. Fortunately kidney function has recovered. He's being followed by primary care closely  3. History of colon polyps and family history of colon cancer -- repeat colonoscopy recommended September 2020

## 2015-04-12 NOTE — Patient Instructions (Signed)
Please follow up with Dr Hilarie Fredrickson in 4 months..  We have sent the following medications to your pharmacy for you to pick up at your convenience: Amitiza 24 mcg twice daily Linzess 290 mcg once daily as needed for constipation

## 2015-04-20 ENCOUNTER — Other Ambulatory Visit (INDEPENDENT_AMBULATORY_CARE_PROVIDER_SITE_OTHER): Payer: Medicare Other

## 2015-04-20 DIAGNOSIS — E1165 Type 2 diabetes mellitus with hyperglycemia: Secondary | ICD-10-CM

## 2015-04-20 DIAGNOSIS — IMO0002 Reserved for concepts with insufficient information to code with codable children: Secondary | ICD-10-CM

## 2015-04-20 LAB — COMPREHENSIVE METABOLIC PANEL
ALT: 45 U/L (ref 0–53)
AST: 16 U/L (ref 0–37)
Albumin: 3.9 g/dL (ref 3.5–5.2)
Alkaline Phosphatase: 62 U/L (ref 39–117)
BILIRUBIN TOTAL: 0.6 mg/dL (ref 0.2–1.2)
BUN: 23 mg/dL (ref 6–23)
CO2: 23 mEq/L (ref 19–32)
Calcium: 9.6 mg/dL (ref 8.4–10.5)
Chloride: 108 mEq/L (ref 96–112)
Creatinine, Ser: 1.28 mg/dL (ref 0.40–1.50)
GFR: 71.81 mL/min (ref >60.00)
Glucose, Bld: 125 mg/dL — ABNORMAL HIGH (ref 70–99)
Potassium: 4.2 mEq/L (ref 3.5–5.1)
Sodium: 139 mEq/L (ref 135–145)
Total Protein: 6.7 g/dL (ref 6.0–8.3)

## 2015-04-20 LAB — HEMOGLOBIN A1C: Hgb A1c MFr Bld: 6.7 % — ABNORMAL HIGH (ref 4.6–6.5)

## 2015-04-23 ENCOUNTER — Encounter: Payer: Self-pay | Admitting: Endocrinology

## 2015-04-23 ENCOUNTER — Ambulatory Visit (INDEPENDENT_AMBULATORY_CARE_PROVIDER_SITE_OTHER): Payer: Medicare Other | Admitting: Endocrinology

## 2015-04-23 VITALS — BP 138/82 | HR 58 | Temp 98.1°F | Resp 14 | Ht 67.5 in | Wt 218.0 lb

## 2015-04-23 DIAGNOSIS — R600 Localized edema: Secondary | ICD-10-CM

## 2015-04-23 DIAGNOSIS — N289 Disorder of kidney and ureter, unspecified: Secondary | ICD-10-CM

## 2015-04-23 DIAGNOSIS — E1165 Type 2 diabetes mellitus with hyperglycemia: Secondary | ICD-10-CM | POA: Diagnosis not present

## 2015-04-23 NOTE — Progress Notes (Signed)
Patient ID: Noah Barnes, male   DOB: [birth year], 69 y.o.   MRN: [id number]    Reason for Appointment : F/u for Type 2 Diabetes  History of Present Illness          Diagnosis: Type 2 diabetes mellitus, date of diagnosis: 2008       Past history: His diabetes was borderline in the beginning and not clear what medications he was started on. Has been taking metformin for several years and this was later changed to [place]. Amaryl probably started in 5/14 when blood sugars were higher, initially was given 1 mg and now has been taking 2 mg. He had a relatively high A1c of 7.1, difficulty losing weight and a glucose of 202 on his initial consultation in 06/2013 Since then he has been on Invokana in addition to his Janumet and Amaryl Invokana was reduced to 100 mg in early 2015 been creatinine had gone up to 1.6 His Amaryl was stopped in 6/15 because of tendency to occasional hypoglycemia  Recent history:  He was on a regimen of Janumet and Invokana 100 mg with excellent control Because of renal dysfunction his Invokana was stopped and Janumet reduced to once a day Blood sugars were worsening and Amaryl 2 mg added at dinnertime in 9/16       Oral hypoglycemic drugs the patient is taking are: Amaryl 2 mg at supper, Janumet 50/1000 at dinner   Side effects from medications have been:  none  Glucose monitoring:  done 1 times a day         Glucometer: One Touch   Blood Glucose readings recently   Mean values apply above for all meters except median for One Touch  PRE-MEAL Fasting Lunch Dinner Bedtime Overall  Glucose range:  101-188   99-136   88-126   118-133    Mean/median:  130      126    Hypoglycemia: none  Glycemic control:   Lab Results  Component Value Date   HGBA1C 6.7* 04/20/2015   HGBA1C 6.4 12/31/2014   HGBA1C 7.1* 09/22/2014   Lab Results  Component Value Date   MICROALBUR 5.2* 01/05/2015   LDLCALC 62 12/31/2014   CREATININE 1.28 04/20/2015     Self-care: The diet that the patient has been following has improved since he saw the nutritionist and his wife is cooking better, more vegetables in diet Exercise:  walking 1-3/7 days a week    Dietician consultations: 08/2013              Retinal exam: Most recent: 2013  Weight history:   Wt Readings from Last 3 Encounters:  04/23/15 218 lb (98.884 kg)  04/12/15 218 lb (98.884 kg)  03/26/15 208 lb 12.8 oz (94.711 kg)         Medication List       This list is accurate as of: 04/23/15  1:06 PM.  Always use your most recent med list.               allopurinol 300 MG tablet  Commonly known as:  ZYLOPRIM  Take 300 mg by mouth daily. 1/2 qd     aspirin EC 81 MG tablet  Take 81 mg by mouth every morning.     atorvastatin 20 MG tablet  Commonly known as:  LIPITOR  TAKE ONE TABLET BY MOUTH ONCE DAILY     cloNIDine 0.1 MG tablet  Commonly known as:  CATAPRES  Take 0.1 mg by  mouth 2 (two) times daily. Don't take if BP average less than 135/80     furosemide 20 MG tablet  Commonly known as:  LASIX  Take 1-2 tablets (20-40 mg total) by mouth daily as needed.     glimepiride 2 MG tablet  Commonly known as:  AMARYL  Take 1 tablet (2 mg total) by mouth daily before breakfast.     glucose blood test strip  Commonly known as:  ONE TOUCH ULTRA TEST  Use to test blood sugar ICD 10 E11.29     hydrALAZINE 25 MG tablet  Commonly known as:  APRESOLINE  Take 1 tablet (25 mg total) by mouth every 8 (eight) hours.     Linaclotide 290 MCG Caps capsule  Commonly known as:  LINZESS  Take 1 capsule (290 mcg total) by mouth daily as needed.     lubiprostone 24 MCG capsule  Commonly known as:  AMITIZA  Take 1 capsule (24 mcg total) by mouth 2 (two) times daily with a meal.     metoprolol 100 MG tablet  Commonly known as:  LOPRESSOR  Take 1 tablet (100 mg total) by mouth 2 (two) times daily.     ONETOUCH DELICA LANCETS FINE Misc  Use to help check blood sugar twice a day  Dx E11.9     verapamil 240 MG CR tablet  Commonly known as:  CALAN-SR  Take 0.5 tablets (120 mg total) by mouth 2 (two) times daily.     VIAGRA 100 MG tablet  Generic drug:  sildenafil        Allergies:  Allergies  Allergen Reactions  . Ace Inhibitors Other (See Comments)    Severe AKI due to ARB + dehydration Aug 2016  . Angiotensin Receptor Blockers Other (See Comments)    Severe AKI due to ARB + dehydration Aug 2016  . Losartan Other (See Comments)    Severe AKI due to ARB + dehydration Aug 2016    Past Medical History  Diagnosis Date  . Hypertension   . Diabetes mellitus   . Hyperlipidemia   . Gout   . ED (erectile dysfunction)   . IBS (irritable bowel syndrome)   . Tubular adenoma of colon   . Diverticulosis   . Renal calculus   . Renal insufficiency   . Chronic diastolic heart failure ([place])   . Constipation     Past Surgical History  Procedure Laterality Date  . Colonoscopy  1998    negative; no F/U  . Knee effusion tapped      Family History  Problem Relation Age of Onset  . Heart attack Mother 71  . Prostate cancer Father 39  . Diabetes Father   . Colon cancer Brother   . Stroke Neg Hx   . Irritable bowel syndrome Sister     x 2  . Irritable bowel syndrome Brother   . Irritable bowel syndrome Father   . Hypertension Mother     Social History:  reports that he quit smoking about 23 years ago. His smoking use included Cigarettes. He smoked 0.30 packs per day. He has never used smokeless tobacco. He reports that he drinks about 1.2 oz of alcohol per week. He reports that he does not use illicit drugs.    Review of Systems   RENAL dysfunction: He was having abnormal renal functions persisting after his discharge but creatinine is back to nearly normal and about his baseline  Lab Results  Component Value Date   CREATININE  1.28 04/20/2015   CREATININE 1.72* 03/26/2015   CREATININE 1.56* 03/11/2015   CREATININE 1.60* 03/04/2015     EDEMA:  He had been previously on Lasix and this was held on his last visit because of low normal blood pressure and renal dysfunction. He has taking the Lasix only occasionally His weight is up 10 pounds       Lipids: Has been on pravastatin for hyperlipidemia, baseline LDL? 162; labs as follows  Lab Results  Component Value Date   CHOL 122 12/31/2014   HDL 40.60 12/31/2014   LDLCALC 62 12/31/2014   LDLDIRECT 80.1 04/15/2014   TRIG 99.0 12/31/2014   CHOLHDL 3 12/31/2014       The blood pressure has been high for several years and usually well controlled.  Currently taking verapamil, hydralazine and metoprolol.  Also on Lasix 20 mg 3 times a week Blood pressure is relatively higher today   LABS:  Appointment on 04/20/2015  Component Date Value Ref Range Status  . Hgb A1c MFr Bld 04/20/2015 6.7* 4.6 - 6.5 % Final   Glycemic Control Guidelines for People with Diabetes:Non Diabetic:  <6%Goal of Therapy: <7%Additional Action Suggested:  >8%   . Sodium 04/20/2015 139  135 - 145 mEq/L Final  . Potassium 04/20/2015 4.2  3.5 - 5.1 mEq/L Final  . Chloride 04/20/2015 108  96 - 112 mEq/L Final  . CO2 04/20/2015 23  19 - 32 mEq/L Final  . Glucose, Bld 04/20/2015 125* 70 - 99 mg/dL Final  . BUN 04/20/2015 23  6 - 23 mg/dL Final  . Creatinine, Ser 04/20/2015 1.28  0.40 - 1.50 mg/dL Final  . Total Bilirubin 04/20/2015 0.6  0.2 - 1.2 mg/dL Final  . Alkaline Phosphatase 04/20/2015 62  39 - 117 U/L Final  . AST 04/20/2015 16  0 - 37 U/L Final  . ALT 04/20/2015 45  0 - 53 U/L Final  . Total Protein 04/20/2015 6.7  6.0 - 8.3 g/dL Final  . Albumin 04/20/2015 3.9  3.5 - 5.2 g/dL Final  . Calcium 04/20/2015 9.6  8.4 - 10.5 mg/dL Final  . GFR 04/20/2015 71.81  >60.00 mL/min Final    Physical Examination:  BP 138/82 mmHg  Pulse 58  Temp(Src) 98.1 F (36.7 C)  Resp 14  Ht 5' 7.5" (1.715 m)  Wt 218 lb (98.884 kg)  BMI 33.62 kg/m2  SpO2 94%   He does not look dyspneic 2+ ankle edema  bilaterally  ASSESSMENT/PLAN:   Diabetes type 2, uncontrolled with obesity, BMI 34    His blood sugars are improving with restarting low dose Janumet and adding Amaryl in the evening More recently his blood sugars are nearly normal fasting, not checking enough readings in the evenings but these are fairly good also Will continue the same regimen unless his blood sugars start going up or he has difficulty losing weight  Hypertension: Blood pressure is high normal today  EDEMA:  He has had a recurrence of his edema and he has been taking his Lasix only about 3 times a week May be having more edema because of stopping Invokana Etiology of edema is unclear, does not have clinical features of CHF  Advised him to start taking at least one Lasix every day and take a second one if edema does not improve Continue follow-up with cardiologist and PCP  RENAL dysfunction: Resolved  Patient Instructions  Check blood sugars on waking up 2-3  times a week Also check blood sugars about 2  hours after a meal and do this after different meals by rotation  Recommended blood sugar levels on waking up is 90-130 and about 2 hours after meal is 130-160  Please bring your blood sugar monitor to each visit, thank you  Take Lasix daily     Nelta Caudill 04/23/2015, 1:06 PM   Addendum: Creatinine clearance 51, will maintain above regimen, need short-term follow-up

## 2015-04-23 NOTE — Patient Instructions (Addendum)
Check blood sugars on waking up 2-3  times a week Also check blood sugars about 2 hours after a meal and do this after different meals by rotation  Recommended blood sugar levels on waking up is 90-130 and about 2 hours after meal is 130-160  Please bring your blood sugar monitor to each visit, thank you  Take Lasix daily

## 2015-05-05 ENCOUNTER — Other Ambulatory Visit: Payer: Medicare Other

## 2015-05-07 ENCOUNTER — Other Ambulatory Visit: Payer: Self-pay | Admitting: Internal Medicine

## 2015-05-10 ENCOUNTER — Ambulatory Visit: Payer: Medicare Other | Admitting: Endocrinology

## 2015-05-18 ENCOUNTER — Other Ambulatory Visit: Payer: Self-pay | Admitting: Internal Medicine

## 2015-05-21 ENCOUNTER — Encounter: Payer: Self-pay | Admitting: Internal Medicine

## 2015-05-21 ENCOUNTER — Ambulatory Visit (INDEPENDENT_AMBULATORY_CARE_PROVIDER_SITE_OTHER): Payer: Medicare Other | Admitting: Internal Medicine

## 2015-05-21 VITALS — BP 120/74 | HR 53 | Temp 97.7°F | Resp 16 | Ht 68.0 in | Wt 221.0 lb

## 2015-05-21 DIAGNOSIS — E1129 Type 2 diabetes mellitus with other diabetic kidney complication: Secondary | ICD-10-CM | POA: Diagnosis not present

## 2015-05-21 DIAGNOSIS — E782 Mixed hyperlipidemia: Secondary | ICD-10-CM

## 2015-05-21 DIAGNOSIS — I10 Essential (primary) hypertension: Secondary | ICD-10-CM

## 2015-05-21 MED ORDER — CLONIDINE HCL 0.1 MG PO TABS: 0.1000 mg | ORAL_TABLET | Freq: Two times a day (BID) | ORAL | Status: DC

## 2015-05-21 NOTE — Progress Notes (Signed)
Pre visit review using our clinic review tool, if applicable. No additional management support is needed unless otherwise documented below in the visit note. 

## 2015-05-21 NOTE — Progress Notes (Signed)
   Subjective:    Patient ID: Noah Barnes, male    DOB: [birth year], 69 y.o.   MRN: [id number]  HPI The patient is here to assess status of active health conditions.  He states that he is on a heart healthy diet. He averages walking twice a week 2 miles a session without associated cardiopulmonary symptoms  In September this year he was hospitalized for AKI in context of poor oral intake, ARB and diuretic. He had significant renal insufficiency with a creatinine of 1.72. This improved to 1.28. In March of this year his A1c was 7.1%; it is now 6.7%. He is followed by Dr. Dwyane Dee.  Lipids were checked 12/31/14 and are at goal. Specifically LDL was 62; HDL 40.6 and: Triglycerides 99.   PMH, FH, & Social History reviewed & updated.No change in [place] as recorded.  Review of Systems  Chest pain, palpitations, tachycardia, exertional dyspnea, paroxysmal nocturnal dyspnea, claudication or edema are absent. Dysuria, pyuria, hematuria, frequency, nocturia or polyuria are denied.     Objective:   Physical Exam Pertinent or positive findings include: Abdomen is protuberant. General appearance :adequately nourished; in no distress.  Eyes: No conjunctival inflammation or scleral icterus is present.  Oral exam:  Lips and gums are healthy appearing.There is no oropharyngeal erythema or exudate noted. Dental hygiene is good.  Heart:  Normal rate and regular rhythm. S1 and S2 normal without gallop, murmur, click, rub or other extra sounds    Lungs:Chest clear to auscultation; no wheezes, rhonchi,rales ,or rubs present.No increased work of breathing.   Abdomen: bowel sounds normal, soft and non-tender without masses, organomegaly or hernias noted.  No guarding or rebound.   Vascular : all pulses equal ; no bruits present.  Skin:Warm & dry.  Intact without suspicious lesions or rashes ; no tenting .  Lymphatic: No lymphadenopathy is noted about the head, neck, axilla.   Neuro: Strength, tone  normal.     Assessment & Plan:  #1 hypertension adequately controlled  #2 dyslipidemia, excellent control    #3 diabetes as per Dr. Dwyane Dee   #4 AKI reversed  #5 erectile dysfunction; Viagra renewed.  We discussed the risk factors for premature heart attack or stroke. I congratulated him on addressing these risks. I did encourage him to exercise at least 30 minutes 3 times a week.

## 2015-05-21 NOTE — Patient Instructions (Addendum)
Minimal Blood Pressure Goal= AVERAGE < 140/90;  Ideal is an AVERAGE < 135/85. This AVERAGE should be calculated from @ least 5-7 BP readings taken @ different times of day on different days of week. You should not respond to isolated BP readings , but rather the AVERAGE for that week .Please bring your  blood pressure cuff to office visits to verify that it is reliable.It  can also be checked against the blood pressure device at the pharmacy. Finger or wrist cuffs are not dependable; an arm cuff is.  Cardiovascular exercise, this can be as simple a program as walking, is recommended 30-45 minutes 3-4 times per week. If you're not exercising you should take 6-8 weeks to build up to this level.

## 2015-06-16 ENCOUNTER — Other Ambulatory Visit (INDEPENDENT_AMBULATORY_CARE_PROVIDER_SITE_OTHER): Payer: Medicare Other

## 2015-06-16 DIAGNOSIS — E1165 Type 2 diabetes mellitus with hyperglycemia: Secondary | ICD-10-CM

## 2015-06-16 LAB — BASIC METABOLIC PANEL
BUN: 21 mg/dL (ref 6–23)
CO2: 21 mEq/L (ref 19–32)
CREATININE: 1.29 mg/dL (ref 0.40–1.50)
Calcium: 9.3 mg/dL (ref 8.4–10.5)
Chloride: 112 mEq/L (ref 96–112)
GFR: 71.14 mL/min (ref >60.00)
Glucose, Bld: 116 mg/dL — ABNORMAL HIGH (ref 70–99)
Potassium: 4.3 mEq/L (ref 3.5–5.1)
Sodium: 140 mEq/L (ref 135–145)

## 2015-06-23 ENCOUNTER — Ambulatory Visit (INDEPENDENT_AMBULATORY_CARE_PROVIDER_SITE_OTHER): Payer: Medicare Other | Admitting: Endocrinology

## 2015-06-23 VITALS — BP 114/64 | HR 56 | Temp 97.7°F | Resp 14 | Ht 68.0 in | Wt 223.6 lb

## 2015-06-23 DIAGNOSIS — E119 Type 2 diabetes mellitus without complications: Secondary | ICD-10-CM

## 2015-06-23 NOTE — Progress Notes (Signed)
Patient ID: Noah Barnes, male   DOB: 02-28-1946, 69 y.o.   MRN: [id number]    Reason for Appointment : F/u for Type 2 Diabetes  History of Present Illness          Diagnosis: Type 2 diabetes mellitus, date of diagnosis: 2008       Past history: His diabetes was borderline in the beginning and not clear what medications he was started on. Has been taking metformin for several years and this was later changed to [place]. Amaryl probably started in 5/14 when blood sugars were higher, initially was given 1 mg and now has been taking 2 mg. He had a relatively high A1c of 7.1, difficulty losing weight and a glucose of 202 on his initial consultation in 06/2013 Since then he has been on Invokana in addition to his Janumet and Amaryl Invokana was reduced to 100 mg in early 2015 been creatinine had gone up to 1.6 His Amaryl was stopped in 6/15 because of tendency to occasional hypoglycemia  Recent history:  He was on a regimen of Janumet and Invokana 100 mg with excellent control Because of renal dysfunction his Invokana was stopped and Janumet reduced to once a day  Amaryl 2 mg added at dinnertime in 9/16 With this his blood sugars have improved consistently Recent readings are practically normal although he is checking readings only before meals in the morning and suppertime However he has gained weight, probably from combination of lack of exercise and inconsistent diet       Oral hypoglycemic drugs the patient is taking are: Amaryl 2 mg at supper, Janumet 50/1000 at dinner   Side effects from medications have been:  none  Glucose monitoring:  done 1 times a day         Glucometer: One Touch   Blood Glucose readings recently   Mean values apply above for all meters except median for One Touch  PRE-MEAL Fasting Lunch Dinner Bedtime Overall  Glucose range: 90-139  78-103    Mean/median: 100  95  105    Hypoglycemia: none  Glycemic control:   Lab Results  Component Value  Date   HGBA1C 6.7* 04/20/2015   HGBA1C 6.4 12/31/2014   HGBA1C 7.1* 09/22/2014   Lab Results  Component Value Date   MICROALBUR 5.2* 01/05/2015   LDLCALC 62 12/31/2014   CREATININE 1.29 06/16/2015    Self-care: The diet that the patient has been following has improved since he saw the nutritionist and his wife is cooking better, more vegetables in diet Exercise:  walking 1-2/7 days a week    Dietician consultations: 08/2013              Retinal exam: Most recent: 2013  Weight history:   Wt Readings from Last 3 Encounters:  06/23/15 223 lb 9.6 oz (101.424 kg)  05/21/15 221 lb (100.245 kg)  04/23/15 218 lb (98.884 kg)         Medication List       This list is accurate as of: 06/23/15 11:42 AM.  Always use your most recent med list.               allopurinol 300 MG tablet  Commonly known as:  ZYLOPRIM  Take 300 mg by mouth daily. 1/2 qd     aspirin EC 81 MG tablet  Take 81 mg by mouth every morning.     atorvastatin 20 MG tablet  Commonly known as:  LIPITOR  TAKE  ONE TABLET BY MOUTH ONCE DAILY     cloNIDine 0.1 MG tablet  Commonly known as:  CATAPRES  Take 1 tablet (0.1 mg total) by mouth 2 (two) times daily.     furosemide 20 MG tablet  Commonly known as:  LASIX  Take 1-2 tablets (20-40 mg total) by mouth daily as needed.     glimepiride 2 MG tablet  Commonly known as:  AMARYL  Take 1 tablet (2 mg total) by mouth daily before breakfast.     glucose blood test strip  Commonly known as:  ONE TOUCH ULTRA TEST  Use to test blood sugar ICD 10 E11.29     hydrALAZINE 25 MG tablet  Commonly known as:  APRESOLINE  Take 1 tablet (25 mg total) by mouth every 8 (eight) hours.     Linaclotide 290 MCG Caps capsule  Commonly known as:  LINZESS  Take 1 capsule (290 mcg total) by mouth daily as needed.     lubiprostone 24 MCG capsule  Commonly known as:  AMITIZA  Take 1 capsule (24 mcg total) by mouth 2 (two) times daily with a meal.     metoprolol 100 MG  tablet  Commonly known as:  LOPRESSOR  Take 1 tablet (100 mg total) by mouth 2 (two) times daily.     ONETOUCH DELICA LANCETS FINE Misc  Use to help check blood sugar twice a day Dx E11.9     sitaGLIPtin-metformin 50-1000 MG tablet  Commonly known as:  JANUMET  Take 1 tablet by mouth 2 (two) times daily with a meal.     verapamil 240 MG CR tablet  Commonly known as:  CALAN-SR  Take 0.5 tablets (120 mg total) by mouth 2 (two) times daily.     VIAGRA 100 MG tablet  Generic drug:  sildenafil        Allergies:  Allergies  Allergen Reactions  . Ace Inhibitors Other (See Comments)    Severe AKI due to ARB + dehydration Aug 2016  . Angiotensin Receptor Blockers Other (See Comments)    Severe AKI due to ARB + dehydration Aug 2016  . Losartan Other (See Comments)    Severe AKI due to ARB + dehydration Aug 2016    Past Medical History  Diagnosis Date  . Hypertension   . Diabetes mellitus   . Hyperlipidemia   . Gout   . ED (erectile dysfunction)   . IBS (irritable bowel syndrome)   . Tubular adenoma of colon   . Diverticulosis   . Renal calculus   . Renal insufficiency   . Chronic diastolic heart failure ([place])   . Constipation     Past Surgical History  Procedure Laterality Date  . Colonoscopy  1998    negative; no F/U  . Knee effusion tapped      Family History  Problem Relation Age of Onset  . Heart attack Mother 5  . Prostate cancer Father 71  . Diabetes Father   . Colon cancer Brother   . Stroke Neg Hx   . Irritable bowel syndrome Sister     x 2  . Irritable bowel syndrome Brother   . Irritable bowel syndrome Father   . Hypertension Mother     Social History:  reports that he quit smoking about 23 years ago. His smoking use included Cigarettes. He smoked 0.30 packs per day. He has never used smokeless tobacco. He reports that he drinks about 1.2 oz of alcohol per week. He reports that  he does not use illicit drugs.    Review of Systems   RENAL  dysfunction: He has stable function currently  Lab Results  Component Value Date   CREATININE 1.29 06/16/2015   CREATININE 1.28 04/20/2015   CREATININE 1.72* 03/26/2015   CREATININE 1.56* 03/11/2015     EDEMA: He had been previously on Lasix and he is not taking this about 3 times a week for swelling His weight is up again, seen by PCP last month       Lipids: Has been on pravastatin for hyperlipidemia, baseline LDL? 162; labs as follows  Lab Results  Component Value Date   CHOL 122 12/31/2014   HDL 40.60 12/31/2014   LDLCALC 62 12/31/2014   LDLDIRECT 80.1 04/15/2014   TRIG 99.0 12/31/2014   CHOLHDL 3 12/31/2014       The blood pressure has been high for several years and usually well controlled.  Currently taking verapamil, hydralazine and metoprolol.  Also on Lasix 20 mg 3 times a week Blood pressure is relatively lower today   LABS:  No visits with results within 1 Week(s) from this visit. Latest known visit with results is:  Lab on 06/16/2015  Component Date Value Ref Range Status  . Sodium 06/16/2015 140  135 - 145 mEq/L Final  . Potassium 06/16/2015 4.3  3.5 - 5.1 mEq/L Final  . Chloride 06/16/2015 112  96 - 112 mEq/L Final  . CO2 06/16/2015 21  19 - 32 mEq/L Final  . Glucose, Bld 06/16/2015 116* 70 - 99 mg/dL Final  . BUN 06/16/2015 21  6 - 23 mg/dL Final  . Creatinine, Ser 06/16/2015 1.29  0.40 - 1.50 mg/dL Final  . Calcium 06/16/2015 9.3  8.4 - 10.5 mg/dL Final  . GFR 06/16/2015 71.14  >60.00 mL/min Final    Physical Examination:  BP 114/64 mmHg  Pulse 56  Temp(Src) 97.7 F (36.5 C) (Oral)  Resp 14  Ht 5\' 8"  (1.727 m)  Wt 223 lb 9.6 oz (101.424 kg)  BMI 34.01 kg/m2  SpO2 93%    2+ edema present  ASSESSMENT/PLAN:   Diabetes type 2, uncontrolled with obesity, BMI 34    His blood sugars are excellent without hypoglycemia and 2 mg Amaryl and Janumet once a day He has gained weight and needs to do better with his diet and exercise  regimen Discussed blood sugar targets and need to monitor more after meals He will start going to the gym for exercise For now will reduce his Amaryl to half tablet  Hypertension: Blood pressure is normal today  EDEMA:  He has some residual edema.  He will discuss further management with PCP  Patient Instructions  Take 1/2 tab Glimepreide to 1/2   Check blood sugars on waking up 2-3  times a week Also check blood sugars about 2 hours after a meal and do this after different meals by rotation  Recommended blood sugar levels on waking up is 90-130 and about 2 hours after meal is 130-160  Please bring your blood sugar monitor to each visit, thank you  Exercise 3/7 days a week      Mirta Mally 06/23/2015, 11:42 AM   Addendum: Creatinine clearance 51, will maintain above regimen, need short-term follow-up

## 2015-06-23 NOTE — Patient Instructions (Signed)
Take 1/2 tab Glimepreide to 1/2   Check blood sugars on waking up 2-3  times a week Also check blood sugars about 2 hours after a meal and do this after different meals by rotation  Recommended blood sugar levels on waking up is 90-130 and about 2 hours after meal is 130-160  Please bring your blood sugar monitor to each visit, thank you  Exercise 3/7 days a week

## 2015-06-24 ENCOUNTER — Other Ambulatory Visit: Payer: Self-pay | Admitting: Internal Medicine

## 2015-06-24 NOTE — Telephone Encounter (Signed)
OK but what is six month's supply ?

## 2015-06-24 NOTE — Telephone Encounter (Signed)
Patient is transfer to Dr. Quay Burow at the end of January.  He is requesting a 6 month script of Viagra to be sent to [facility] on [address].

## 2015-06-25 MED ORDER — SILDENAFIL CITRATE 100 MG PO TABS: ORAL_TABLET | ORAL | Status: DC

## 2015-06-25 NOTE — Telephone Encounter (Signed)
Called pt no answer LMOM rx sent to [facility] aid...Noah Barnes

## 2015-07-23 ENCOUNTER — Other Ambulatory Visit: Payer: Self-pay | Admitting: Endocrinology

## 2015-07-29 ENCOUNTER — Ambulatory Visit (INDEPENDENT_AMBULATORY_CARE_PROVIDER_SITE_OTHER): Payer: Medicare Other | Admitting: Internal Medicine

## 2015-07-29 ENCOUNTER — Encounter: Payer: Self-pay | Admitting: Internal Medicine

## 2015-07-29 VITALS — BP 120/64 | HR 57 | Temp 97.7°F | Resp 16 | Wt 222.0 lb

## 2015-07-29 DIAGNOSIS — E1122 Type 2 diabetes mellitus with diabetic chronic kidney disease: Secondary | ICD-10-CM | POA: Diagnosis not present

## 2015-07-29 DIAGNOSIS — K589 Irritable bowel syndrome without diarrhea: Secondary | ICD-10-CM

## 2015-07-29 DIAGNOSIS — Z8739 Personal history of other diseases of the musculoskeletal system and connective tissue: Secondary | ICD-10-CM

## 2015-07-29 DIAGNOSIS — E782 Mixed hyperlipidemia: Secondary | ICD-10-CM

## 2015-07-29 DIAGNOSIS — I10 Essential (primary) hypertension: Secondary | ICD-10-CM | POA: Diagnosis not present

## 2015-07-29 DIAGNOSIS — N182 Chronic kidney disease, stage 2 (mild): Secondary | ICD-10-CM

## 2015-07-29 DIAGNOSIS — Z8639 Personal history of other endocrine, nutritional and metabolic disease: Secondary | ICD-10-CM | POA: Diagnosis not present

## 2015-07-29 NOTE — Assessment & Plan Note (Signed)
BP Readings from Last 3 Encounters:  07/29/15 120/64  06/23/15 114/64  05/21/15 120/74   Controlled Continue current meds

## 2015-07-29 NOTE — Progress Notes (Signed)
Subjective:    Patient ID: Noah Barnes, male    DOB: 25-Oct-1945, 70 y.o.   MRN: [id number]  HPI He is here to establish with a new pcp.   Diabetes:  He follows with Dr Dwyane Dee.  He is taking his medication daily as prescribed. He is compliant with a diabetic diet. He is not exercising regularly. He monitors his sugars and they have been running 100-105.  He denies episodes of hypoglycemia.  He is up-to-date with an ophthalmology examination, no retinopathy.   IBS:  He takes linzess more on the weekends.  He takes [place] during the week.  He feels this is controlled and follows with GI.  His is up to date with his colonoscopy.  CKD:  He was taken off the losartan and his kidney function improved.  He had AKI in august 2016 and it was related to dehydration and the losatan he had been taking for a while.  His last renal function was normal.   Hypertension: He is taking his medication daily. He is compliant with a low sodium diet.  He denies chest pain, palpitations, edema, shortness of breath and regular headaches. He is not exercising regularly.  He does not monitor his blood pressure at home.     Medications and allergies reviewed with patient and updated if appropriate.  Patient Active Problem List   Diagnosis Date Noted  . CKD stage 2 due to type 2 diabetes mellitus ([place]) 02/25/2015  . Chronic diastolic heart failure ([place]) 02/25/2015  . Right renal mass 02/25/2015  . Acute renal failure ([place]) 02/24/2015  . Other constipation 02/22/2015  . Chronic venous insufficiency 01/18/2015  . Superficial phlebitis 01/18/2015  . AKI (acute kidney injury) ([place]) 11/12/2013  . IBS (irritable bowel syndrome) 11/12/2013  . Family history of prostate cancer 12/26/2011  . Diabetes mellitus with renal complications ([place]) [id number]  . History of gout 12/31/2007  . HYPERLIPIDEMIA 08/14/2007  . ERECTILE DYSFUNCTION 05/09/2007  . NONSPECIFIC ABNORMAL ELECTROCARDIOGRAM 05/09/2007  . Essential  hypertension 11/28/2006    Current Outpatient Prescriptions on File Prior to Visit  Medication Sig Dispense Refill  . allopurinol (ZYLOPRIM) 300 MG tablet Take 300 mg by mouth daily. 1/2 qd 30 tablet 3  . aspirin EC 81 MG tablet Take 81 mg by mouth every morning.    [name] atorvastatin (LIPITOR) 20 MG tablet TAKE ONE TABLET BY MOUTH ONCE DAILY (Patient taking differently: TAKE 20 MG BY MOUTH ONCE DAILY.) 90 tablet 0  . cloNIDine (CATAPRES) 0.1 MG tablet Take 1 tablet (0.1 mg total) by mouth 2 (two) times daily. 180 tablet 0  . furosemide (LASIX) 20 MG tablet Take 1-2 tablets (20-40 mg total) by mouth daily as needed. 60 tablet 3  . glimepiride (AMARYL) 2 MG tablet Take 1 tablet (2 mg total) by mouth daily before breakfast. 30 tablet 3  . glucose blood (ONE TOUCH ULTRA TEST) test strip Use to test blood sugar ICD 10 E11.29 100 each 3  . hydrALAZINE (APRESOLINE) 25 MG tablet Take 1 tablet (25 mg total) by mouth every 8 (eight) hours. 270 tablet 1  . JANUMET 50-1000 MG tablet TAKE ONE TABLET BY MOUTH TWICE DAILY WITH MEALS 60 tablet 5  . Linaclotide (LINZESS) 290 MCG CAPS capsule Take 1 capsule (290 mcg total) by mouth daily as needed. 30 capsule 2  . lubiprostone (AMITIZA) 24 MCG capsule Take 1 capsule (24 mcg total) by mouth 2 (two) times daily with a meal. 60 capsule 3  .  metoprolol (LOPRESSOR) 100 MG tablet Take 1 tablet (100 mg total) by mouth 2 (two) times daily. 180 tablet 1  . ONETOUCH DELICA LANCETS FINE MISC Use to help check blood sugar twice a day Dx E11.9 100 each 3  . sildenafil (VIAGRA) 100 MG tablet take as directed 6 tablet 5  . sitaGLIPtin-metformin (JANUMET) 50-1000 MG tablet Take 1 tablet by mouth 2 (two) times daily with a meal.    . verapamil (CALAN-SR) 240 MG CR tablet Take 0.5 tablets (120 mg total) by mouth 2 (two) times daily. 90 tablet 2   No current facility-administered medications on file prior to visit.    Past Medical History  Diagnosis Date  . Hypertension   .  Diabetes mellitus   . Hyperlipidemia   . Gout   . ED (erectile dysfunction)   . IBS (irritable bowel syndrome)   . Tubular adenoma of colon   . Diverticulosis   . Renal calculus   . Renal insufficiency   . Chronic diastolic heart failure ([place])   . Constipation     Past Surgical History  Procedure Laterality Date  . Colonoscopy  1998    negative; no F/U  . Knee effusion tapped      Social History   Social History  . Marital Status: Divorced    Spouse Name: N/A  . Number of Children: 1  . Years of Education: N/A   Occupational History  . retired     [id number]   Social History Main Topics  . Smoking status: Former Smoker -- 0.30 packs/day    Types: Cigarettes    Quit date: 07/04/1991  . Smokeless tobacco: Never Used     Comment: smoked age [age], up to 1/3 ppd; may be less  . Alcohol Use: 1.2 oz/week    2 Glasses of wine per week     Comment:  socially  . Drug Use: No  . Sexual Activity: Not on file   Other Topics Concern  . Not on file   Social History Narrative    Family History  Problem Relation Age of Onset  . Heart attack Mother 79  . Prostate cancer Father 60  . Diabetes Father   . Colon cancer Brother   . Stroke Neg Hx   . Irritable bowel syndrome Sister     x 2  . Irritable bowel syndrome Brother   . Irritable bowel syndrome Father   . Hypertension Mother     Review of Systems  Constitutional: Negative for fever and chills.  Respiratory: Negative for cough, shortness of breath and wheezing.   Cardiovascular: Positive for leg swelling (occasional). Negative for chest pain and palpitations.  Gastrointestinal: Positive for abdominal pain and constipation.       No GERD  Neurological: Positive for headaches (rare). Negative for dizziness, light-headedness and numbness.       Objective:   Filed Vitals:   07/29/15 0943  BP: 120/64  Pulse: 57  Temp: 97.7 F (36.5 C)  Resp: 16   Filed Weights   07/29/15 0943  Weight: 222 lb (100.699  kg)   Body mass index is 33.76 kg/(m^2).   Physical Exam Constitutional: Appears well-developed and well-nourished. No distress.  Neck: Neck supple. No tracheal deviation present. No thyromegaly present.  No carotid bruit. No cervical adenopathy.   Cardiovascular: Normal rate, regular rhythm and normal heart sounds.   No murmur heard.  No edema Pulmonary/Chest: Effort normal and breath sounds normal. No respiratory distress. No wheezes.  Assessment & Plan:    See Problem List for Assessment and Plan of chronic medical problems.  Follow up annually for PE

## 2015-07-29 NOTE — Assessment & Plan Note (Signed)
On allopurinol

## 2015-07-29 NOTE — Assessment & Plan Note (Signed)
Continue lipitor Check lipids at PE

## 2015-07-29 NOTE — Progress Notes (Signed)
Pre visit review using our clinic review tool, if applicable. No additional management support is needed unless otherwise documented below in the visit note. 

## 2015-07-29 NOTE — Assessment & Plan Note (Signed)
Kidney function recently looked good Continue to monitor

## 2015-07-29 NOTE — Patient Instructions (Addendum)
  All other Health Maintenance issues reviewed.   All recommended immunizations and age-appropriate screenings are up-to-date.  No immunizations administered today.   Medications reviewed and updated.  No changes recommended at this time.  Your prescription(s) have been submitted to your pharmacy. Please take as directed and contact our office if you believe you are having problem(s) with the medication(s).   Please followup for your annual physical

## 2015-07-29 NOTE — Assessment & Plan Note (Signed)
Follows with GI Constipation Takes linzess or amitiza depending on day

## 2015-08-03 ENCOUNTER — Other Ambulatory Visit: Payer: Self-pay | Admitting: Endocrinology

## 2015-08-30 ENCOUNTER — Encounter: Payer: Self-pay | Admitting: *Deleted

## 2015-09-07 ENCOUNTER — Ambulatory Visit (INDEPENDENT_AMBULATORY_CARE_PROVIDER_SITE_OTHER): Payer: Medicare Other | Admitting: Internal Medicine

## 2015-09-07 ENCOUNTER — Encounter: Payer: Self-pay | Admitting: Internal Medicine

## 2015-09-07 VITALS — BP 144/84 | HR 56 | Ht 67.0 in | Wt 228.1 lb

## 2015-09-07 DIAGNOSIS — K589 Irritable bowel syndrome without diarrhea: Secondary | ICD-10-CM

## 2015-09-07 DIAGNOSIS — Z86010 Personal history of colonic polyps: Secondary | ICD-10-CM

## 2015-09-07 DIAGNOSIS — K5900 Constipation, unspecified: Secondary | ICD-10-CM

## 2015-09-07 DIAGNOSIS — K5909 Other constipation: Secondary | ICD-10-CM

## 2015-09-07 MED ORDER — LUBIPROSTONE 24 MCG PO CAPS: 24.0000 ug | ORAL_CAPSULE | Freq: Two times a day (BID) | ORAL | Status: DC

## 2015-09-07 NOTE — Patient Instructions (Addendum)
We have sent the following medications to your pharmacy for you to pick up at your convenience: Amitiza 24 mcg twice daily  Continue Linzess as needed.  Please follow up with Dr Hilarie Fredrickson in 1 year.

## 2015-09-07 NOTE — Progress Notes (Signed)
   Subjective:    Patient ID: Noah Barnes, male    DOB: [birth year], 70 y.o.   MRN: [id number]  HPI Noah Barnes is a 70 year old male with a past medical history of IBS with constipation predominance, adenomatous colon polyps, family history of colon cancer, diverticulosis who is here for follow-up. Last seen 04/12/2015. He has been taking Amitiza 24 g once daily and using Linzess on Saturdays if necessary to help further evacuation. He reports the Amitiza works but it is "slow". He has a bowel movement most days but at times he feels like he is not completely "cleaned out". He denies abdominal pain. No blood in his stool or melena. Appetite is good. No nausea or vomiting.   Review of Systems As per history of present illness, otherwise negative  Current Medications, Allergies, Past Medical History, Past Surgical History, Family History and Social History were reviewed in [employer] record.     Objective:   Physical Exam BP 144/84 mmHg  Pulse 56  Ht 5\' 7"  (1.702 m)  Wt 228 lb 2 oz (103.477 kg)  BMI 35.72 kg/m2 Constitutional: Well-developed and well-nourished. No distress. HEENT: Normocephalic and atraumatic. [name] Conjunctivae are normal.  No scleral icterus. Neck: Neck supple. Trachea midline. Cardiovascular: Normal rate, regular rhythm and intact distal pulses. No M/R/G Pulmonary/chest: Effort normal and breath sounds normal. No wheezing, rales or rhonchi. Abdominal: Soft, nontender, nondistended. Bowel sounds active throughout. There are no masses palpable. No hepatosplenomegaly. Extremities: no clubbing, cyanosis, or edema Neurological: Alert and oriented to person place and time. Skin: Skin is warm and dry. Psychiatric: Normal mood and affect. Behavior is normal.  CMP     Component Value Date/Time   NA 140 06/16/2015 0931   K 4.3 06/16/2015 0931   CL 112 06/16/2015 0931   CO2 21 06/16/2015 0931   GLUCOSE 116* 06/16/2015 0931   BUN 21 06/16/2015 0931   CREATININE 1.29 06/16/2015 0931   CALCIUM 9.3 06/16/2015 0931   PROT 6.7 04/20/2015 0949   ALBUMIN 3.9 04/20/2015 0949   AST 16 04/20/2015 0949   ALT 45 04/20/2015 0949   ALKPHOS 62 04/20/2015 0949   BILITOT 0.6 04/20/2015 0949   GFRNONAA 43* 03/04/2015 0530   GFRAA 49* 03/04/2015 0530      Assessment & Plan:  70 year old male with a past medical history of IBS with constipation predominance, adenomatous colon polyps, family history of colon cancer, diverticulosis who is here for follow-up.  1. Chronic constipation/IBS -- he has been using Amitiza once daily rather than twice daily as written. I think this was just miscommunication on our part. He will likely do better with Amitiza 24 g twice a day. He will increase the dose at this time. He can still use Linzess if necessary on the weekends to help feel fully evacuated. Initially Linzess worked well but when taken daily lost efficacy. He is happy with the current regimen and I think will feel better with twice a day Amitiza. 5 me of any side effects or worsening constipation, blood in his stool or abdominal pain. He voices understanding  2. History of colon polyps and family history of colon cancer -- surveillance colonoscopy recommended September 2020  One year follow-up, sooner if necessary

## 2015-09-16 ENCOUNTER — Other Ambulatory Visit (INDEPENDENT_AMBULATORY_CARE_PROVIDER_SITE_OTHER): Payer: Medicare Other

## 2015-09-16 DIAGNOSIS — E119 Type 2 diabetes mellitus without complications: Secondary | ICD-10-CM | POA: Diagnosis not present

## 2015-09-16 LAB — COMPREHENSIVE METABOLIC PANEL
ALK PHOS: 54 U/L (ref 39–117)
ALT: 29 U/L (ref 0–53)
AST: 15 U/L (ref 0–37)
Albumin: 4.2 g/dL (ref 3.5–5.2)
BUN: 21 mg/dL (ref 6–23)
CO2: 23 mEq/L (ref 19–32)
Calcium: 9.3 mg/dL (ref 8.4–10.5)
Chloride: 111 mEq/L (ref 96–112)
Creatinine, Ser: 1.17 mg/dL (ref 0.40–1.50)
GFR: 79.56 mL/min (ref >60.00)
GLUCOSE: 109 mg/dL — AB (ref 70–99)
POTASSIUM: 4.5 meq/L (ref 3.5–5.1)
Sodium: 140 mEq/L (ref 135–145)
Total Bilirubin: 0.6 mg/dL (ref 0.2–1.2)
Total Protein: 7 g/dL (ref 6.0–8.3)

## 2015-09-16 LAB — HEMOGLOBIN A1C: Hgb A1c MFr Bld: 6 % (ref 4.6–6.5)

## 2015-09-22 ENCOUNTER — Ambulatory Visit (INDEPENDENT_AMBULATORY_CARE_PROVIDER_SITE_OTHER): Payer: Medicare Other | Admitting: Endocrinology

## 2015-09-22 ENCOUNTER — Other Ambulatory Visit: Payer: Self-pay | Admitting: *Deleted

## 2015-09-22 ENCOUNTER — Encounter: Payer: Self-pay | Admitting: Endocrinology

## 2015-09-22 VITALS — BP 116/70 | HR 54 | Temp 97.4°F | Resp 14 | Ht 67.0 in | Wt 222.0 lb

## 2015-09-22 DIAGNOSIS — E119 Type 2 diabetes mellitus without complications: Secondary | ICD-10-CM

## 2015-09-22 MED ORDER — GLUCOSE BLOOD VI STRP: ORAL_STRIP | Status: DC

## 2015-09-22 NOTE — Progress Notes (Signed)
Patient ID: Noah Barnes, male   DOB: 10-25-1945, 69 y.o.   MRN: [id number]    Reason for Appointment : F/u for Type 2 Diabetes  History of Present Illness          Diagnosis: Type 2 diabetes mellitus, date of diagnosis: 2008       Past history: His diabetes was borderline in the beginning and not clear what medications he was started on. Has been taking metformin for several years and this was later changed to [place]. Amaryl probably started in 5/14 when blood sugars were higher, initially was given 1 mg and now has been taking 2 mg. He had a relatively high A1c of 7.1, difficulty losing weight and a glucose of 202 on his initial consultation in 06/2013 Since then he has been on Invokana in addition to his Janumet and Amaryl Invokana was reduced to 100 mg in early 2015 been creatinine had gone up to 1.6 His Amaryl was stopped in 6/15 because of tendency to occasional hypoglycemia  Recent history:  He was on a regimen of Janumet and Invokana 100 mg with excellent control Because of renal dysfunction his Invokana was stopped and Janumet reduced to once a day  Amaryl 2 mg added at dinnertime in 9/16  With this his blood sugars have  Been very well controlled and now A1c is down to 6% Recent readings are near normal He was told to reduce Amaryl to 1 mg on his last visit but he thinks his sugars started going up and he continued to 2 mg No hypoglycemia with this His weight is about the same even though he has not exercised as instructed       Oral hypoglycemic drugs the patient is taking are: Amaryl 2 mg at supper, Janumet 50/1000 at dinner   Side effects from medications have been:  none  Glucose monitoring:  done 1 times a day         Glucometer: One Touch   Blood Glucose readings recently   Mean values apply above for all meters except median for One Touch  PRE-MEAL Fasting Lunch Dinner Bedtime Overall  Glucose range:  83-142   85  101-123   Mean/median:  110     109      Hypoglycemia: none  Glycemic control:   Lab Results  Component Value Date   HGBA1C 6.0 09/16/2015   HGBA1C 6.7* 04/20/2015   HGBA1C 6.4 12/31/2014   Lab Results  Component Value Date   MICROALBUR 5.2* 01/05/2015   LDLCALC 62 12/31/2014   CREATININE 1.17 09/16/2015    Self-care: The diet  improved since he saw the nutritionist and his wife is cooking better, more vegetables in diet Exercise:  walking 0-2/7 days a week     Dietician consultations: 08/2013              Retinal exam: Most recent: 2013  Weight history:   Wt Readings from Last 3 Encounters:  09/22/15 222 lb (100.699 kg)  09/07/15 228 lb 2 oz (103.477 kg)  07/29/15 222 lb (100.699 kg)         Medication List       This list is accurate as of: 09/22/15  4:45 PM.  Always use your most recent med list.               allopurinol 300 MG tablet  Commonly known as:  ZYLOPRIM  Take 300 mg by mouth daily. 1/2 qd  aspirin EC 81 MG tablet  Take 81 mg by mouth every morning.     atorvastatin 20 MG tablet  Commonly known as:  LIPITOR  TAKE ONE TABLET BY MOUTH ONCE DAILY     cloNIDine 0.1 MG tablet  Commonly known as:  CATAPRES  Take 1 tablet (0.1 mg total) by mouth 2 (two) times daily.     furosemide 20 MG tablet  Commonly known as:  LASIX  Take 1-2 tablets (20-40 mg total) by mouth daily as needed.     glimepiride 2 MG tablet  Commonly known as:  AMARYL  TAKE ONE TABLET BY MOUTH ONCE DAILY BEFORE  BREAKFAST     glucose blood test strip  Commonly known as:  ONE TOUCH ULTRA TEST  Use to test blood sugar 2 times per day ICD 10 E11.29     hydrALAZINE 25 MG tablet  Commonly known as:  APRESOLINE  Take 1 tablet (25 mg total) by mouth every 8 (eight) hours.     JANUMET 50-1000 MG tablet  Generic drug:  sitaGLIPtin-metformin  TAKE ONE TABLET BY MOUTH TWICE DAILY WITH MEALS     Linaclotide 290 MCG Caps capsule  Commonly known as:  LINZESS  Take 1 capsule (290 mcg total) by mouth daily as  needed.     lubiprostone 24 MCG capsule  Commonly known as:  AMITIZA  Take 1 capsule (24 mcg total) by mouth 2 (two) times daily with a meal.     metoprolol 100 MG tablet  Commonly known as:  LOPRESSOR  Take 1 tablet (100 mg total) by mouth 2 (two) times daily.     ONETOUCH DELICA LANCETS FINE Misc  Use to help check blood sugar twice a day Dx E11.9     sildenafil 100 MG tablet  Commonly known as:  VIAGRA  take as directed     verapamil 240 MG CR tablet  Commonly known as:  CALAN-SR  Take 0.5 tablets (120 mg total) by mouth 2 (two) times daily.        Allergies:  Allergies  Allergen Reactions  . Ace Inhibitors Other (See Comments)    Severe AKI due to ARB + dehydration Aug 2016  . Angiotensin Receptor Blockers Other (See Comments)    Severe AKI due to ARB + dehydration Aug 2016  . Losartan Other (See Comments)    Severe AKI due to ARB + dehydration Aug 2016    Past Medical History  Diagnosis Date  . Hypertension   . Diabetes mellitus   . Hyperlipidemia   . Gout   . ED (erectile dysfunction)   . IBS (irritable bowel syndrome)   . Tubular adenoma of colon   . Diverticulosis   . Renal calculus   . Renal insufficiency   . Chronic diastolic heart failure ([place])   . Constipation     Past Surgical History  Procedure Laterality Date  . Colonoscopy  1998    negative; no F/U  . Knee effusion tapped      Family History  Problem Relation Age of Onset  . Heart attack Mother 6  . Prostate cancer Father 7  . Diabetes Father   . Colon cancer Brother   . Stroke Neg Hx   . Irritable bowel syndrome Sister     x 2  . Irritable bowel syndrome Brother   . Irritable bowel syndrome Father   . Hypertension Mother     Social History:  reports that he quit smoking about 24 years  ago. His smoking use included Cigarettes. He smoked 0.30 packs per day. He has never used smokeless tobacco. He reports that he drinks about 1.2 oz of alcohol per week. He reports that he does  not use illicit drugs.    Review of Systems   RENAL dysfunction: He has stable function currently  Lab Results  Component Value Date   CREATININE 1.17 09/16/2015   CREATININE 1.29 06/16/2015   CREATININE 1.28 04/20/2015   CREATININE 1.72* 03/26/2015     EDEMA: He had been previously on Lasix and he is not taking this about 3 times a week for swelling His weight is up again, seen by PCP last month       Lipids: Has been on pravastatin for hyperlipidemia, baseline LDL? 162; labs as follows  Lab Results  Component Value Date   CHOL 122 12/31/2014   HDL 40.60 12/31/2014   LDLCALC 62 12/31/2014   LDLDIRECT 80.1 04/15/2014   TRIG 99.0 12/31/2014   CHOLHDL 3 12/31/2014       The blood pressure has been high for several years and usually well controlled.  Currently taking verapamil, hydralazine and metoprolol.  Also on Lasix 20 mg 3 times a week Blood pressure is relatively lower today   LABS:  Lab on 09/16/2015  Component Date Value Ref Range Status  . Hgb A1c MFr Bld 09/16/2015 6.0  4.6 - 6.5 % Final   Glycemic Control Guidelines for People with Diabetes:Non Diabetic:  <6%Goal of Therapy: <7%Additional Action Suggested:  >8%   . Sodium 09/16/2015 140  135 - 145 mEq/L Final  . Potassium 09/16/2015 4.5  3.5 - 5.1 mEq/L Final  . Chloride 09/16/2015 111  96 - 112 mEq/L Final  . CO2 09/16/2015 23  19 - 32 mEq/L Final  . Glucose, Bld 09/16/2015 109* 70 - 99 mg/dL Final  . BUN 09/16/2015 21  6 - 23 mg/dL Final  . Creatinine, Ser 09/16/2015 1.17  0.40 - 1.50 mg/dL Final  . Total Bilirubin 09/16/2015 0.6  0.2 - 1.2 mg/dL Final  . Alkaline Phosphatase 09/16/2015 54  39 - 117 U/L Final  . AST 09/16/2015 15  0 - 37 U/L Final  . ALT 09/16/2015 29  0 - 53 U/L Final  . Total Protein 09/16/2015 7.0  6.0 - 8.3 g/dL Final  . Albumin 09/16/2015 4.2  3.5 - 5.2 g/dL Final  . Calcium 09/16/2015 9.3  8.4 - 10.5 mg/dL Final  . GFR 09/16/2015 79.56  >60.00 mL/min Final    Physical  Examination:  BP 116/70 mmHg  Pulse 54  Temp(Src) 97.4 F (36.3 C)  Resp 14  Ht 5\' 7"  (1.702 m)  Wt 222 lb (100.699 kg)  BMI 34.76 kg/m2  SpO2 96%    no  edema present  ASSESSMENT/PLAN:   Diabetes type 2, uncontrolled with obesity, BMI 34    His blood sugars are excellent without hypoglycemia and 2 mg Amaryl and Janumet once a day A1c is excellent at 6% Discussed blood sugar targets  He will start going to the gym for exercise  Hypertension: Blood pressure is normal     Patient Instructions  Exercise !     Delise Simenson 09/22/2015, 4:45 PM   Addendum: Creatinine clearance 51, will maintain above regimen, need short-term follow-up

## 2015-09-22 NOTE — Patient Instructions (Signed)
Exercise

## 2015-09-28 ENCOUNTER — Other Ambulatory Visit: Payer: Self-pay | Admitting: Internal Medicine

## 2015-10-24 ENCOUNTER — Other Ambulatory Visit: Payer: Self-pay | Admitting: Internal Medicine

## 2015-10-25 ENCOUNTER — Telehealth: Payer: Self-pay

## 2015-10-25 NOTE — Telephone Encounter (Signed)
[employer] medical forms received, completed, and placed on MD's desk for signature

## 2015-10-27 NOTE — Telephone Encounter (Signed)
Paperwork completed, copy sent to scan, original mailed to pt per request. Pt advised via VM

## 2015-10-29 ENCOUNTER — Telehealth: Payer: Self-pay | Admitting: Internal Medicine

## 2015-10-29 ENCOUNTER — Encounter: Payer: Self-pay | Admitting: Cardiovascular Disease

## 2015-10-29 ENCOUNTER — Ambulatory Visit (INDEPENDENT_AMBULATORY_CARE_PROVIDER_SITE_OTHER): Payer: Medicare Other | Admitting: Cardiovascular Disease

## 2015-10-29 VITALS — BP 116/72 | HR 68 | Ht 68.0 in | Wt 217.0 lb

## 2015-10-29 DIAGNOSIS — E785 Hyperlipidemia, unspecified: Secondary | ICD-10-CM

## 2015-10-29 DIAGNOSIS — R9431 Abnormal electrocardiogram [ECG] [EKG]: Secondary | ICD-10-CM

## 2015-10-29 DIAGNOSIS — I10 Essential (primary) hypertension: Secondary | ICD-10-CM

## 2015-10-29 NOTE — Patient Instructions (Signed)
Medication Instructions:  Your physician recommends that you continue on your current medications as directed. Please refer to the Current Medication list given to you today.  Labwork: No new orders.   Testing/Procedures: Your physician has requested that you have a lexiscan myoview. For further information please visit [email or web address]. Please follow instruction sheet, as given.  Follow-Up: Your physician wants you to follow-up in: 1 YEAR with Dr Burt Knack.  You will receive a reminder letter in the mail two months in advance. If you don't receive a letter, please call our office to schedule the follow-up appointment.   Any Other Special Instructions Will Be Listed Below (If Applicable).     If you need a refill on your cardiac medications before your next appointment, please call your pharmacy.

## 2015-10-29 NOTE — Telephone Encounter (Signed)
Pt states he is having abdominal pain and not having bowel movements. Asked pt when his last BM was and he states he took linzess and all he passed was water. Discussed with pt that if he is having a lot of pain and thinks he may have a blockage he should be seen in the ER or Urgent care. Pt wanted to schedule an appt to be seen next week. Pt scheduled to see Nicoletta Ba PA 11/02/15@3pm . Pt aware of appt. Pt states he would rather see the doctor because last time he saw a PA here he ended up in the hospital. Pt states he will "try to hold out until appt."

## 2015-10-29 NOTE — Progress Notes (Signed)
Cardiology Office Note Date:  10/29/2015   ID:  Noah Barnes, DOB 1945/10/26, MRN [id number]  PCP:  Binnie Rail, MD  Cardiologist:  Sherren Mocha, MD    Chief Complaint  Patient presents with  . chronic diastolic heart failure    no cp,sob,lee or claudication     History of Present Illness: Noah Barnes is a 70 y.o. male who presents for Follow-up evaluation. He was initially seen in 2013 for evaluation of an abnormal EKG. A nuclear stress scan at that time showed no ischemia. He was seen by Tenny Craw, PA last year after a hospitalization for renal failure. The patient developed acute renal failure and his creatinine went up to about 10 mg/dL. Fortunately his kidney function improved quickly with hydration. During that hospitalization he was noted again to have an abnormal EKG with lateral ST elevation but no cardiac symptoms associated with this. An echocardiogram showed preserved LV function is hypokinesis of the inferior wall. When he was seen in follow-up he was asymptomatic and no further testing was planned.  Today he reports that he continues to do well. He has no symptoms of chest pain or shortness of breath. He denies lightheadedness, heart palpitations, or syncope.   Past Medical History  Diagnosis Date  . Hypertension   . Diabetes mellitus   . Hyperlipidemia   . Gout   . ED (erectile dysfunction)   . IBS (irritable bowel syndrome)   . Tubular adenoma of colon   . Diverticulosis   . Renal calculus   . Renal insufficiency   . Chronic diastolic heart failure ([place])   . Constipation     Past Surgical History  Procedure Laterality Date  . Colonoscopy  1998    negative; no F/U  . Knee effusion tapped      Current Outpatient Prescriptions  Medication Sig Dispense Refill  . allopurinol (ZYLOPRIM) 300 MG tablet Take 150 mg by mouth daily. Take 1/2 tablet by mouth once daily 30 tablet 3  . aspirin EC 81 MG tablet Take 81 mg by mouth every morning.    [name]  atorvastatin (LIPITOR) 20 MG tablet Take 20 mg by mouth daily.    . cloNIDine (CATAPRES) 0.1 MG tablet Take 1 tablet (0.1 mg total) by mouth 2 (two) times daily. 180 tablet 0  . furosemide (LASIX) 20 MG tablet Take 20-40 mg by mouth daily as needed for fluid or edema.    [name] glimepiride (AMARYL) 2 MG tablet TAKE ONE TABLET BY MOUTH ONCE DAILY BEFORE  BREAKFAST 30 tablet 3  . glucose blood (ONE TOUCH ULTRA TEST) test strip Use to test blood sugar 2 times per day ICD 10 E11.29 100 each 3  . hydrALAZINE (APRESOLINE) 25 MG tablet Take 1 tablet (25 mg total) by mouth every 8 (eight) hours. 270 tablet 1  . JANUMET 50-1000 MG tablet TAKE ONE TABLET BY MOUTH TWICE DAILY WITH MEALS 60 tablet 5  . linaclotide (LINZESS) 290 MCG CAPS capsule Take 290 mcg by mouth as directed. Take 1 capsule by mouth once daily on Saturday and Sunday    . lubiprostone (AMITIZA) 24 MCG capsule Take 1 capsule (24 mcg total) by mouth 2 (two) times daily with a meal. 60 capsule 3  . metoprolol (LOPRESSOR) 100 MG tablet TAKE ONE TABLET BY MOUTH TWICE DAILY 180 tablet 2  . ONETOUCH DELICA LANCETS FINE MISC Use to help check blood sugar twice a day Dx E11.9 100 each 3  . sildenafil (VIAGRA)  100 MG tablet take as directed 6 tablet 5  . verapamil (CALAN-SR) 240 MG CR tablet Take 0.5 tablets (120 mg total) by mouth 2 (two) times daily. 90 tablet 2   No current facility-administered medications for this visit.    Allergies:   Ace inhibitors; Angiotensin receptor blockers; and Losartan   Social History:  The patient  reports that he quit smoking about 24 years ago. His smoking use included Cigarettes. He smoked 0.30 packs per day. He has never used smokeless tobacco. He reports that he drinks about 1.2 oz of alcohol per week. He reports that he does not use illicit drugs.   Family History:  The patient's family history includes Colon cancer in his brother; Diabetes in his father; Heart attack (age of onset: 3) in his mother;  Hypertension in his mother; Irritable bowel syndrome in his brother, father, and sister; Prostate cancer (age of onset: 18) in his father. There is no history of Stroke.    ROS:  Please see the history of present illness.  Otherwise, review of systems is positive for constipation.  All other systems are reviewed and negative.    PHYSICAL EXAM: VS:  BP 116/72 mmHg  Pulse 68  Ht 5\' 8"  (1.727 m)  Wt 217 lb (98.431 kg)  BMI 33.00 kg/m2 , BMI Body mass index is 33 kg/(m^2). GEN: Well nourished, well developed, in no acute distress HEENT: normal Neck: no JVD, no masses. No carotid bruits Cardiac: RRR without murmur or gallop                Respiratory:  clear to auscultation bilaterally, normal work of breathing GI: soft, nontender, nondistended, + BS MS: no deformity or atrophy Ext: no pretibial edema, pedal pulses 2+= bilaterally Skin: warm and dry, no rash Neuro:  Strength and sensation are intact Psych: euthymic mood, full affect  EKG:  EKG is ordered today. The ekg ordered today shows a sinus rhythm 63 bpm, early repolarization pattern, lateral ST elevation and marked inferior ST/T-wave abnormality.  Recent Labs: 02/24/2015: TSH 0.762 03/03/2015: Hemoglobin 12.0*; Platelets 196 09/16/2015: ALT 29; BUN 21; Creatinine, Ser 1.17; Potassium 4.5; Sodium 140   Lipid Panel     Component Value Date/Time   CHOL 122 12/31/2014 0900   TRIG 99.0 12/31/2014 0900   HDL 40.60 12/31/2014 0900   CHOLHDL 3 12/31/2014 0900   VLDL 19.8 12/31/2014 0900   LDLCALC 62 12/31/2014 0900   LDLDIRECT 80.1 04/15/2014 0953      Wt Readings from Last 3 Encounters:  10/29/15 217 lb (98.431 kg)  09/22/15 222 lb (100.699 kg)  09/07/15 228 lb 2 oz (103.477 kg)     Cardiac Studies Reviewed: 2D Echo 03/04/2015: Left ventricle: The cavity size was normal. Systolic function was normal. The estimated ejection fraction was in the range of 60% to 65%. Regional wall motion abnormalities: There is  hypokinesis of the basal-midinferior myocardium. Doppler parameters are consistent with abnormal left ventricular relaxation (grade 1 diastolic dysfunction).  ------------------------------------------------------------------- Aortic valve: Trileaflet; moderately thickened, moderately calcified leaflets. Valve mobility was restricted. Doppler: Transvalvular velocity was within the normal range. There was no stenosis. There was no regurgitation.  ------------------------------------------------------------------- Aorta: Aortic root: The aortic root was normal in size.  ------------------------------------------------------------------- Mitral valve: Structurally normal valve. Mobility was not restricted. Doppler: Transvalvular velocity was within the normal range. There was no evidence for stenosis. There was mild regurgitation. Peak gradient (D): 3 mm Hg.  ------------------------------------------------------------------- Left atrium: The atrium was normal in size.  -------------------------------------------------------------------  Right ventricle: The cavity size was normal. Wall thickness was normal. Systolic function was normal.  ------------------------------------------------------------------- Pulmonic valve: Poorly visualized. Structurally normal valve. Cusp separation was normal. Doppler: Transvalvular velocity was within the normal range. There was no evidence for stenosis. There was trivial regurgitation.  ------------------------------------------------------------------- Tricuspid valve: Structurally normal valve. Doppler: Transvalvular velocity was within the normal range. There was mild regurgitation.  ------------------------------------------------------------------- Pulmonary artery: The main pulmonary artery was normal-sized. Systolic pressure was mildly  increased.  ------------------------------------------------------------------- Right atrium: The atrium was normal in size.  ------------------------------------------------------------------- Pericardium: There was no pericardial effusion.  ------------------------------------------------------------------- Systemic veins: Inferior vena cava: The vessel was normal in size.   ASSESSMENT AND PLAN: 62. 70 year old male with abnormal EKG and wall motion abnormality on echo: It's interesting that his EKG back on August 24 lungs similar with lateral ST elevation and marked inferior ST/T changes. This has resolved on subsequent EKGs until today's tracing. The patient is completely asymptomatic. I think with these findings, we should proceed with nuclear stress testing to evaluate coronary perfusion. While the patient is asymptomatic, he does have long-standing diabetes and is at risk for silent ischemia. I'll plan on seeing him back in one year unless his stress test is abnormal. The patient continues on aspirin 81 mg daily and a statin drug  2. Essential hypertension with CAD: Blood pressure is well controlled on current medications.  3. Type 2 diabetes: Treated by Dr. Dwyane Dee on oral hypoglycemic.  4. Hyperlipidemia: Treated with atorvastatin 20 mg daily.  Current medicines are reviewed with the patient today.  The patient does not have concerns regarding medicines.  Labs/ tests ordered today include:  No orders of the defined types were placed in this encounter.    Disposition:   FU one year  Signed, Sherren Mocha, MD  10/29/2015 10:44 AM    [facility] [address], [place], [place]  [address] Phone: [phone]; Fax: [phone]

## 2015-11-01 ENCOUNTER — Telehealth (HOSPITAL_COMMUNITY): Payer: Self-pay | Admitting: *Deleted

## 2015-11-01 NOTE — Telephone Encounter (Signed)
Patient given detailed instructions per Myocardial Perfusion Study Information Sheet for the test on 11/04/15. Patient notified to arrive 15 minutes early and that it is imperative to arrive on time for appointment to keep from having the test rescheduled.  If you need to cancel or reschedule your appointment, please call the office within 24 hours of your appointment. Failure to do so may result in a cancellation of your appointment, and a $50 no show fee. Patient verbalized understanding. Hubbard Robinson, RN

## 2015-11-02 ENCOUNTER — Ambulatory Visit (INDEPENDENT_AMBULATORY_CARE_PROVIDER_SITE_OTHER): Payer: Medicare Other | Admitting: Physician Assistant

## 2015-11-02 ENCOUNTER — Encounter: Payer: Self-pay | Admitting: Physician Assistant

## 2015-11-02 ENCOUNTER — Other Ambulatory Visit (INDEPENDENT_AMBULATORY_CARE_PROVIDER_SITE_OTHER): Payer: Medicare Other

## 2015-11-02 VITALS — BP 130/80 | HR 58 | Ht 68.0 in | Wt 218.0 lb

## 2015-11-02 DIAGNOSIS — R1032 Left lower quadrant pain: Secondary | ICD-10-CM

## 2015-11-02 DIAGNOSIS — G8929 Other chronic pain: Secondary | ICD-10-CM

## 2015-11-02 DIAGNOSIS — K5909 Other constipation: Secondary | ICD-10-CM

## 2015-11-02 DIAGNOSIS — R1031 Right lower quadrant pain: Secondary | ICD-10-CM

## 2015-11-02 DIAGNOSIS — K5900 Constipation, unspecified: Secondary | ICD-10-CM | POA: Diagnosis not present

## 2015-11-02 DIAGNOSIS — K5732 Diverticulitis of large intestine without perforation or abscess without bleeding: Secondary | ICD-10-CM | POA: Diagnosis not present

## 2015-11-02 LAB — BASIC METABOLIC PANEL
BUN: 26 mg/dL — AB (ref 6–23)
CO2: 17 mEq/L — ABNORMAL LOW (ref 19–32)
Calcium: 9.8 mg/dL (ref 8.4–10.5)
Chloride: 114 mEq/L — ABNORMAL HIGH (ref 96–112)
Creatinine, Ser: 1.31 mg/dL (ref 0.40–1.50)
GFR: 69.81 mL/min (ref >60.00)
Glucose, Bld: 101 mg/dL — ABNORMAL HIGH (ref 70–99)
Potassium: 5.1 mEq/L (ref 3.5–5.1)
Sodium: 138 mEq/L (ref 135–145)

## 2015-11-02 LAB — CBC WITH DIFFERENTIAL/PLATELET
Basophils Absolute: 0 10*3/uL (ref 0.0–0.1)
Basophils Relative: 0.4 % (ref 0.0–3.0)
Eosinophils Absolute: 0.1 10*3/uL (ref 0.0–0.7)
Eosinophils Relative: 2.3 % (ref 0.0–5.0)
HCT: 42.6 % (ref 39.0–52.0)
Hemoglobin: 13.7 g/dL (ref 13.0–17.0)
Lymphocytes Relative: 27.5 % (ref 12.0–46.0)
Lymphs Abs: 1.5 10*3/uL (ref 0.7–4.0)
MCHC: 32.2 g/dL (ref 30.0–36.0)
MCV: 83.6 fl (ref 78.0–100.0)
Monocytes Absolute: 0.5 10*3/uL (ref 0.1–1.0)
Monocytes Relative: 8.9 % (ref 3.0–12.0)
Neutro Abs: 3.3 10*3/uL (ref 1.4–7.7)
Neutrophils Relative %: 60.9 % (ref 43.0–77.0)
Platelets: 254 10*3/uL (ref 150.0–400.0)
RBC: 5.1 Mil/uL (ref 4.22–5.81)
RDW: 14.9 % (ref 11.5–15.5)
WBC: 5.4 10*3/uL (ref 4.0–10.5)

## 2015-11-02 MED ORDER — CIPROFLOXACIN HCL 500 MG PO TABS: ORAL_TABLET | ORAL | Status: DC

## 2015-11-02 MED ORDER — METRONIDAZOLE 500 MG PO TABS: ORAL_TABLET | ORAL | Status: DC

## 2015-11-02 NOTE — Patient Instructions (Addendum)
Please go to the basement level to have your labs drawn.  We sent a prescription for Cipro and Metronidazole ( Flagyl) to [facility], [address]   Add Miralax 17 grams in 8 oz of water daily.  Push oral fluids. 3  Continue Linzess 290 mcg.

## 2015-11-02 NOTE — Progress Notes (Addendum)
Patient ID: Noah Barnes, male   DOB: [birth year], 70 y.o.   MRN: [id number]   Subjective:    Patient ID: Noah Barnes, male    DOB: 19-Mar-1946, 70 y.o.   MRN: [id number]  HPI  Noah Barnes is a 70 year old African-American male known to Dr.Pyrtle with history of IBS and chronic constipation. Has known diverticulosis, hypertension, adult-onset diabetes mellitus and congestive heart failure. He was last seen in March 2017 and at that time was doing well, still having problems with constipation. He had been taking Amitiza 24 g once daily inadvertently and this was increased to twice daily. He also has a prescription for Linzess which at that time he was using about once per week. 's colonoscopy done September 2015 with moderate pandiverticulosis, internal hemorrhoids and had 2 polyps removed, the largest 6 mm which was a tubular adenoma. Patient comes in today with complaints of lower abdominal pain over the past week and a half. He says this is constant and has been somewhat progressive. He has had some radiation of discomfort around bilaterally into his lower back. He has been eating significantly less over the past few days because he feels that eating aggravates the discomfort. No fever or chills no nausea or vomiting. He has had worsening constipation. Over the past week he has been taking the Linzess on a daily basis and feels that the Amitiza is no longer helping him. Even with the Linzess he is having some hard stools and not passing much at a time.  Review of Systems Pertinent positive and negative review of systems were noted in the above HPI section.  All other review of systems was otherwise negative.  Outpatient Encounter Prescriptions as of 11/02/2015  Medication Sig  . allopurinol (ZYLOPRIM) 300 MG tablet Take 150 mg by mouth daily. Take 1/2 tablet by mouth once daily  . aspirin EC 81 MG tablet Take 81 mg by mouth every morning.  [name] atorvastatin (LIPITOR) 20 MG tablet Take 20 mg by mouth  daily.  . cloNIDine (CATAPRES) 0.1 MG tablet Take 1 tablet (0.1 mg total) by mouth 2 (two) times daily.  . furosemide (LASIX) 20 MG tablet Take 20-40 mg by mouth daily as needed for fluid or edema.  [name] glimepiride (AMARYL) 2 MG tablet TAKE ONE TABLET BY MOUTH ONCE DAILY BEFORE  BREAKFAST  . glucose blood (ONE TOUCH ULTRA TEST) test strip Use to test blood sugar 2 times per day ICD 10 E11.29  . hydrALAZINE (APRESOLINE) 25 MG tablet Take 1 tablet (25 mg total) by mouth every 8 (eight) hours.  [name] JANUMET 50-1000 MG tablet TAKE ONE TABLET BY MOUTH TWICE DAILY WITH MEALS  . linaclotide (LINZESS) 290 MCG CAPS capsule Take 290 mcg by mouth as directed. Take 1 capsule by mouth once daily on Saturday and Sunday  . lubiprostone (AMITIZA) 24 MCG capsule Take 1 capsule (24 mcg total) by mouth 2 (two) times daily with a meal.  . metoprolol (LOPRESSOR) 100 MG tablet TAKE ONE TABLET BY MOUTH TWICE DAILY  . ONETOUCH DELICA LANCETS FINE MISC Use to help check blood sugar twice a day Dx E11.9  . sildenafil (VIAGRA) 100 MG tablet take as directed  . verapamil (CALAN-SR) 240 MG CR tablet Take 0.5 tablets (120 mg total) by mouth 2 (two) times daily.  . ciprofloxacin (CIPRO) 500 MG tablet Take 1 tab twice daily with food.  . metroNIDAZOLE (FLAGYL) 500 MG tablet Take 1 tab twice daily with food.   No facility-administered encounter medications  on file as of 11/02/2015.   Allergies  Allergen Reactions  . Ace Inhibitors Other (See Comments)    Severe AKI due to ARB + dehydration Aug 2016  . Angiotensin Receptor Blockers Other (See Comments)    Severe AKI due to ARB + dehydration Aug 2016  . Losartan Other (See Comments)    Severe AKI due to ARB + dehydration Aug 2016   Patient Active Problem List   Diagnosis Date Noted  . CKD stage 2 due to type 2 diabetes mellitus ([place]) 02/25/2015  . Chronic diastolic heart failure ([place]) 02/25/2015  . Right renal mass 02/25/2015  . Acute renal failure ([place]) 02/24/2015  . Other  constipation 02/22/2015  . Chronic venous insufficiency 01/18/2015  . Superficial phlebitis 01/18/2015  . AKI (acute kidney injury) ([place]) 11/12/2013  . IBS (irritable bowel syndrome) 11/12/2013  . Family history of prostate cancer 12/26/2011  . Diabetes mellitus with renal complications ([place]) [id number]  . History of gout 12/31/2007  . HYPERLIPIDEMIA 08/14/2007  . ERECTILE DYSFUNCTION 05/09/2007  . NONSPECIFIC ABNORMAL ELECTROCARDIOGRAM 05/09/2007  . Essential hypertension 11/28/2006   Social History   Social History  . Marital Status: Divorced    Spouse Name: N/A  . Number of Children: 1  . Years of Education: N/A   Occupational History  . retired     [id number]   Social History Main Topics  . Smoking status: Former Smoker -- 0.30 packs/day    Types: Cigarettes    Quit date: 07/04/1991  . Smokeless tobacco: Never Used     Comment: smoked age [age], up to 1/3 ppd; may be less  . Alcohol Use: 1.2 oz/week    2 Glasses of wine per week     Comment:  socially  . Drug Use: No  . Sexual Activity: Not on file   Other Topics Concern  . Not on file   Social History Narrative    Noah Barnes family history includes Colon cancer in his brother; Diabetes in his father; Heart attack (age of onset: 84) in his mother; Hypertension in his mother; Irritable bowel syndrome in his brother, father, and sister; Prostate cancer (age of onset: 7) in his father. There is no history of Stroke.      Objective:    Filed Vitals:   11/02/15 1520  BP: 130/80  Pulse: 58    Physical Exam  well-developed older African-American male in no acute distress, pleasant blood pressure 130/80 pulse 58 height 5 foot 8 weight 218. HEENT; nontraumatic normocephalic EOMI PERRLA sclera anicteric, Cardiovascular ;regular rate and rhythm with S1-S2 no murmur rub or gallop, Pulmonary; clear bilaterally, Abdomen; large soft he's tender in the left lower quadrant and suprapubic region is no guarding or rebound no  palpable mass or hepatosplenomegaly bowel sounds are present, Rectal; exam not done, Extremities ;no clubbing cyanosis or edema skin warm and dry, Neuropsych ;mood and affect appropriate     Assessment & Plan:   #1 70 yo African-American male with 1-1/2-2 week history of somewhat progressive lower abdominal discomfort and increase in his chronic constipation. I suspect he may have diverticulitis. # 2  chronic kidney disease stage II #3 congestive heart failure #4  onset diabetes mellitus #5 history of IBS #6 of adenomatous colon polyps  Plan; CBC with differential, BMET Start Cipro 500 twice a day and Flagyl 500 twice a day 14 days both to be taken with food Push oral fluid intake Continue Linzess 290 g once daily Add MiraLAX 17 g in  8 ounces of water every day Patient is advised to call should his pain increases over the next few days or if he fails to improve with antibiotics and at that point would obtain CT of the abdomen and pelvis.   Ryliegh Mcduffey S Javanna Patin PA-C 11/02/2015   Cc: Binnie Rail, MD   Addendum: Reviewed and agree with initial management. Jerene Bears, MD

## 2015-11-04 ENCOUNTER — Ambulatory Visit (HOSPITAL_COMMUNITY): Payer: Medicare Other | Attending: Cardiology

## 2015-11-04 VITALS — Ht 68.0 in | Wt 218.0 lb

## 2015-11-04 DIAGNOSIS — R0602 Shortness of breath: Secondary | ICD-10-CM

## 2015-11-04 DIAGNOSIS — R9431 Abnormal electrocardiogram [ECG] [EKG]: Secondary | ICD-10-CM

## 2015-11-04 DIAGNOSIS — I10 Essential (primary) hypertension: Secondary | ICD-10-CM | POA: Diagnosis not present

## 2015-11-04 DIAGNOSIS — E785 Hyperlipidemia, unspecified: Secondary | ICD-10-CM | POA: Diagnosis not present

## 2015-11-04 DIAGNOSIS — R9439 Abnormal result of other cardiovascular function study: Secondary | ICD-10-CM | POA: Insufficient documentation

## 2015-11-04 LAB — MYOCARDIAL PERFUSION IMAGING
CHL CUP NUCLEAR SRS: 9
LV dias vol: 142 mL (ref 62–150)
LVSYSVOL: 76 mL
Peak HR: 73 {beats}/min
RATE: 0.26
Rest HR: 58 {beats}/min
SDS: 2
SSS: 11
TID: 1.17

## 2015-11-04 MED ORDER — AMINOPHYLLINE 25 MG/ML IV SOLN
75.0000 mg | Freq: Once | INTRAVENOUS | Status: AC
  Administered 2015-11-04: 75 mg via INTRAVENOUS

## 2015-11-04 MED ORDER — TECHNETIUM TC 99M SESTAMIBI GENERIC - CARDIOLITE
10.4000 | Freq: Once | INTRAVENOUS | Status: AC | PRN
  Administered 2015-11-04: 10 via INTRAVENOUS

## 2015-11-04 MED ORDER — TECHNETIUM TC 99M SESTAMIBI GENERIC - CARDIOLITE
32.5000 | Freq: Once | INTRAVENOUS | Status: AC | PRN
  Administered 2015-11-04: 33 via INTRAVENOUS

## 2015-11-04 MED ORDER — REGADENOSON 0.4 MG/5ML IV SOLN
0.4000 mg | Freq: Once | INTRAVENOUS | Status: AC
  Administered 2015-11-04: 0.4 mg via INTRAVENOUS

## 2015-11-30 ENCOUNTER — Other Ambulatory Visit: Payer: Self-pay | Admitting: Internal Medicine

## 2015-12-07 ENCOUNTER — Other Ambulatory Visit: Payer: Self-pay | Admitting: Endocrinology

## 2016-01-06 NOTE — Telephone Encounter (Signed)
Page 4 updated, faxed, original left in cabinet for pt pick up, copy sent to scan. Pt advised via Personal VM

## 2016-01-09 NOTE — Progress Notes (Signed)
Subjective:    Patient ID: Noah Barnes, male    DOB: [birth year], 70 y.o.   MRN: [id number]  HPI Here for medicare wellness exam/physical exam.   I have personally reviewed and have noted 1.The patient's medical and social history 2.Their use of alcohol, tobacco or illicit drugs 3.Their current medications and supplements 4.The patient's functional ability including ADL's, fall risks, home safety risks and                 hearing or visual impairment. 5.Diet and physical activities 6.Evidence for depression or mood disorders 7.Care team reviewed and updated - endocrine - Dr Dwyane Dee, Cardiology - Dr Burt Knack,  GI - Dr Hilarie Fredrickson  His sugar was 124 this morning.  He denies any concerns   Are there smokers in your home (other than you)? No  Risk Factors Exercise: walking 1 mile three times a week Dietary issues discussed: eats lots of vegetables, compliant with a low sugar/carb diet  Cardiac risk factors: advanced age, hypertension, hyperlipidemia, and obesity (BMI >= 30 kg/m2).  Depression Screen  Have you felt down, depressed or hopeless? No  Have you felt little interest or pleasure in doing things?  No  Activities of Daily Living In your present state of health, do you have any difficulty performing the following activities?:  Driving? No Managing money?  No Feeding yourself? No Getting from bed to chair? No Climbing a flight of stairs? No Preparing food and eating?: No Bathing or showering? No Getting dressed: No Getting to/using the toilet? No Moving around from place to place: No In the past year have you fallen or had a near fall?: No   Are you sexually active?  yes  Do you have more than one partner?  No  Hearing Difficulties: No Do you often ask people to speak up or repeat themselves? No Do you experience ringing or noises in your ears? No Do you have difficulty understanding soft or whispered  voices? No Vision:              Any change in vision:  no             Up to date with eye exam: up to date Memory:  Do you feel that you have a problem with memory? No  Do you often misplace items? No  Do you feel safe at home?  Yes  Cognitive Testing  Alert, Orientated? Yes  Normal Appearance? Yes  Recall of three objects?  Yes  Can perform simple calculations? Yes  Displays appropriate judgment? Yes  Can read the correct time from a watch face? Yes   Advanced Directives have been discussed with the patient? Yes   Medications and allergies reviewed with patient and updated if appropriate.  Patient Active Problem List   Diagnosis Date Noted  . CKD stage 2 due to type 2 diabetes mellitus ([place]) 02/25/2015  . Chronic diastolic heart failure ([place]) 02/25/2015  . Right renal mass 02/25/2015  . Acute renal failure ([place]) 02/24/2015  . Other constipation 02/22/2015  . Chronic venous insufficiency 01/18/2015  . Superficial phlebitis 01/18/2015  . AKI (acute kidney injury) ([place]) 11/12/2013  . IBS (irritable bowel syndrome) 11/12/2013  . Family history of prostate cancer 12/26/2011  . Diabetes mellitus with renal complications ([place]) [id number]  . History of gout 12/31/2007  . HYPERLIPIDEMIA 08/14/2007  . ERECTILE DYSFUNCTION 05/09/2007  . NONSPECIFIC ABNORMAL ELECTROCARDIOGRAM 05/09/2007  . Essential hypertension 11/28/2006    Current Outpatient Prescriptions on File  Prior to Visit  Medication Sig Dispense Refill  . allopurinol (ZYLOPRIM) 300 MG tablet Take 150 mg by mouth daily. Take 1/2 tablet by mouth once daily 30 tablet 3  . allopurinol (ZYLOPRIM) 300 MG tablet TAKE ONE TABLET BY MOUTH ONCE DAILY 30 tablet 2  . aspirin EC 81 MG tablet Take 81 mg by mouth every morning.    [name] atorvastatin (LIPITOR) 20 MG tablet Take 20 mg by mouth daily.    . ciprofloxacin (CIPRO) 500 MG tablet Take 1 tab twice daily with food. 28 tablet 0  . cloNIDine (CATAPRES) 0.1 MG tablet Take 1 tablet  (0.1 mg total) by mouth 2 (two) times daily. 180 tablet 0  . furosemide (LASIX) 20 MG tablet Take 20-40 mg by mouth daily as needed for fluid or edema.    [name] glimepiride (AMARYL) 2 MG tablet TAKE ONE TABLET BY MOUTH ONCE DAILY BEFORE  BREAKFAST 30 tablet 5  . glucose blood (ONE TOUCH ULTRA TEST) test strip Use to test blood sugar 2 times per day ICD 10 E11.29 100 each 3  . hydrALAZINE (APRESOLINE) 25 MG tablet Take 1 tablet (25 mg total) by mouth every 8 (eight) hours. 270 tablet 1  . JANUMET 50-1000 MG tablet TAKE ONE TABLET BY MOUTH TWICE DAILY WITH MEALS 60 tablet 5  . linaclotide (LINZESS) 290 MCG CAPS capsule Take 290 mcg by mouth as directed. Take 1 capsule by mouth once daily on Saturday and Sunday    . lubiprostone (AMITIZA) 24 MCG capsule Take 1 capsule (24 mcg total) by mouth 2 (two) times daily with a meal. 60 capsule 3  . metoprolol (LOPRESSOR) 100 MG tablet TAKE ONE TABLET BY MOUTH TWICE DAILY 180 tablet 2  . metroNIDAZOLE (FLAGYL) 500 MG tablet Take 1 tab twice daily with food. 28 tablet 0  . ONETOUCH DELICA LANCETS FINE MISC Use to help check blood sugar twice a day Dx E11.9 100 each 3  . sildenafil (VIAGRA) 100 MG tablet take as directed 6 tablet 5   No current facility-administered medications on file prior to visit.    Past Medical History  Diagnosis Date  . Hypertension   . Diabetes mellitus   . Hyperlipidemia   . Gout   . ED (erectile dysfunction)   . IBS (irritable bowel syndrome)   . Tubular adenoma of colon   . Diverticulosis   . Renal calculus   . Renal insufficiency   . Chronic diastolic heart failure ([facility])   . Constipation     Past Surgical History  Procedure Laterality Date  . Colonoscopy  1998    negative; no F/U  . Knee effusion tapped      Social History   Social History  . Marital Status: Divorced    Spouse Name: N/A  . Number of Children: 1  . Years of Education: N/A   Occupational History  . retired     34 [id number]   Social History  Main Topics  . Smoking status: Former Smoker -- 0.30 packs/day    Types: Cigarettes    Quit date: 07/04/1991  . Smokeless tobacco: Never Used     Comment: smoked age [age], up to 1/3 ppd; may be less  . Alcohol Use: 1.2 oz/week    2 Glasses of wine per week     Comment:  socially  . Drug Use: No  . Sexual Activity: Not on file   Other Topics Concern  . Not on file   Social History Narrative  Family History  Problem Relation Age of Onset  . Heart attack Mother 84  . Prostate cancer Father 60  . Diabetes Father   . Colon cancer Brother   . Stroke Neg Hx   . Irritable bowel syndrome Sister     x 2  . Irritable bowel syndrome Brother   . Irritable bowel syndrome Father   . Hypertension Mother     Review of Systems  Constitutional: Negative for fever, chills, appetite change, fatigue and unexpected weight change.  HENT: Negative for hearing loss and tinnitus.   Eyes: Negative for visual disturbance.  Respiratory: Negative for cough, shortness of breath and wheezing.   Cardiovascular: Positive for leg swelling (occasional). Negative for chest pain and palpitations.  Gastrointestinal: Positive for constipation (on linzess). Negative for nausea, abdominal pain, diarrhea and blood in stool.       No gerd  Genitourinary: Negative for dysuria, hematuria and difficulty urinating.  Musculoskeletal: Positive for arthralgias (knee pain - occasional). Negative for back pain.  Skin: Negative for color change and rash.  Neurological: Negative for dizziness, light-headedness and headaches.  Psychiatric/Behavioral: Negative for dysphoric mood. The patient is not nervous/anxious.        Objective:   Filed Vitals:   01/10/16 1053  BP: 138/88  Pulse: 54  Temp: 97.8 F (36.6 C)  Resp: 16   Filed Weights   01/10/16 1053  Weight: 237 lb (107.502 kg)   Body mass index is 36.04 kg/(m^2).   Physical Exam Constitutional: He appears well-developed and well-nourished. No distress.    HENT:  Head: Normocephalic and atraumatic.  Right Ear: External ear normal.  Left Ear: External ear normal.  Mouth/Throat: Oropharynx is clear and moist.  Normal ear canals and TM b/l  Eyes: Conjunctivae and EOM are normal.  Neck: Neck supple. No tracheal deviation present. No thyromegaly present.  No carotid bruit  Cardiovascular: Normal rate, regular rhythm, normal heart sounds and intact distal pulses.   1/6 systolic murmur heard. Pulmonary/Chest: Effort normal and breath sounds normal. No respiratory distress. He has no wheezes. He has no rales.  Abdominal: Soft. Umbilical and ventral hernia - both non-tender.  He exhibits no distension. There is no tenderness.  Genitourinary: deferred  Musculoskeletal: He exhibits no edema.  Lymphadenopathy:    He has no cervical adenopathy.  Skin: Skin is warm and dry. He is not diaphoretic.  Psychiatric: He has a normal mood and affect. His behavior is normal.       Assessment & Plan:   Wellness Exam: Immunizations - discussed immunizations that he is due for Colonoscopy   Up to date  Eye exam  Up to date  Hearing loss none Memory concerns/difficulties - none Independent of ADLs -  fully Stressed the importance of regular exercise   Patient received copy of preventative screening tests/immunizations recommended for the next 5-10 years.    Physical exam: Screening blood work ordered Immunizations - discussed immunizations that he is due for Colonoscopy    Up to date  Eye exams  Up to date  Exercise - walking the dog - stressed regular exercise Weight - encouraged weight loss Skin - no concerns Substance abuse - none  See Problem List for Assessment and Plan of chronic medical problems.

## 2016-01-10 ENCOUNTER — Telehealth: Payer: Self-pay

## 2016-01-10 ENCOUNTER — Encounter: Payer: Self-pay | Admitting: Internal Medicine

## 2016-01-10 ENCOUNTER — Ambulatory Visit (INDEPENDENT_AMBULATORY_CARE_PROVIDER_SITE_OTHER): Payer: Medicare Other | Admitting: Internal Medicine

## 2016-01-10 ENCOUNTER — Other Ambulatory Visit: Payer: Self-pay | Admitting: Internal Medicine

## 2016-01-10 ENCOUNTER — Other Ambulatory Visit (INDEPENDENT_AMBULATORY_CARE_PROVIDER_SITE_OTHER): Payer: Medicare Other

## 2016-01-10 VITALS — BP 138/88 | HR 54 | Temp 97.8°F | Resp 16 | Ht 68.0 in | Wt 237.0 lb

## 2016-01-10 DIAGNOSIS — E1122 Type 2 diabetes mellitus with diabetic chronic kidney disease: Secondary | ICD-10-CM

## 2016-01-10 DIAGNOSIS — Z8639 Personal history of other endocrine, nutritional and metabolic disease: Secondary | ICD-10-CM

## 2016-01-10 DIAGNOSIS — E1129 Type 2 diabetes mellitus with other diabetic kidney complication: Secondary | ICD-10-CM

## 2016-01-10 DIAGNOSIS — K469 Unspecified abdominal hernia without obstruction or gangrene: Secondary | ICD-10-CM

## 2016-01-10 DIAGNOSIS — I10 Essential (primary) hypertension: Secondary | ICD-10-CM

## 2016-01-10 DIAGNOSIS — N182 Chronic kidney disease, stage 2 (mild): Secondary | ICD-10-CM

## 2016-01-10 DIAGNOSIS — Z8739 Personal history of other diseases of the musculoskeletal system and connective tissue: Secondary | ICD-10-CM

## 2016-01-10 DIAGNOSIS — Z0000 Encounter for general adult medical examination without abnormal findings: Secondary | ICD-10-CM | POA: Diagnosis not present

## 2016-01-10 DIAGNOSIS — E782 Mixed hyperlipidemia: Secondary | ICD-10-CM

## 2016-01-10 LAB — COMPREHENSIVE METABOLIC PANEL
ALK PHOS: 61 U/L (ref 39–117)
ALT: 34 U/L (ref 0–53)
AST: 15 U/L (ref 0–37)
Albumin: 4.5 g/dL (ref 3.5–5.2)
BUN: 36 mg/dL — ABNORMAL HIGH (ref 6–23)
CO2: 24 mEq/L (ref 19–32)
Calcium: 9.8 mg/dL (ref 8.4–10.5)
Chloride: 110 mEq/L (ref 96–112)
Creatinine, Ser: 1.38 mg/dL (ref 0.40–1.50)
GFR: 65.7 mL/min (ref >60.00)
GLUCOSE: 115 mg/dL — AB (ref 70–99)
POTASSIUM: 5.3 meq/L — AB (ref 3.5–5.1)
SODIUM: 139 meq/L (ref 135–145)
TOTAL PROTEIN: 7.5 g/dL (ref 6.0–8.3)
Total Bilirubin: 0.6 mg/dL (ref 0.2–1.2)

## 2016-01-10 LAB — CBC WITH DIFFERENTIAL/PLATELET
Basophils Absolute: 0 10*3/uL (ref 0.0–0.1)
Basophils Relative: 0.5 % (ref 0.0–3.0)
Eosinophils Absolute: 0.1 10*3/uL (ref 0.0–0.7)
Eosinophils Relative: 2.8 % (ref 0.0–5.0)
HCT: 42.2 % (ref 39.0–52.0)
Hemoglobin: 13.6 g/dL (ref 13.0–17.0)
LYMPHS ABS: 1.4 10*3/uL (ref 0.7–4.0)
Lymphocytes Relative: 30.3 % (ref 12.0–46.0)
MCHC: 32.1 g/dL (ref 30.0–36.0)
MCV: 83.5 fl (ref 78.0–100.0)
Monocytes Absolute: 0.4 10*3/uL (ref 0.1–1.0)
Monocytes Relative: 7.6 % (ref 3.0–12.0)
NEUTROS PCT: 58.8 % (ref 43.0–77.0)
Neutro Abs: 2.7 10*3/uL (ref 1.4–7.7)
Platelets: 231 10*3/uL (ref 150.0–400.0)
RBC: 5.06 Mil/uL (ref 4.22–5.81)
RDW: 14.7 % (ref 11.5–15.5)
WBC: 4.6 10*3/uL (ref 4.0–10.5)

## 2016-01-10 LAB — LIPID PANEL
CHOL/HDL RATIO: 3
Cholesterol: 143 mg/dL (ref 0–200)
HDL: 44.9 mg/dL (ref >39.00)
LDL Cholesterol: 81 mg/dL (ref 0–99)
NONHDL: 98.2
Triglycerides: 84 mg/dL (ref 0.0–149.0)
VLDL: 16.8 mg/dL (ref 0.0–40.0)

## 2016-01-10 LAB — MICROALBUMIN / CREATININE URINE RATIO
Creatinine,U: 215.6 mg/dL
Microalb Creat Ratio: 2.4 mg/g (ref 0.0–30.0)
Microalb, Ur: 5.2 mg/dL — ABNORMAL HIGH (ref 0.0–1.9)

## 2016-01-10 LAB — TSH: TSH: 1.61 u[IU]/mL (ref 0.35–4.50)

## 2016-01-10 LAB — URIC ACID: Uric Acid, Serum: 5.4 mg/dL (ref 4.0–7.8)

## 2016-01-10 LAB — HEMOGLOBIN A1C: HEMOGLOBIN A1C: 6 % (ref 4.6–6.5)

## 2016-01-10 NOTE — Assessment & Plan Note (Signed)
Check uric acid level 

## 2016-01-10 NOTE — Assessment & Plan Note (Signed)
Check a1c, urine microalbumin Managed by Dr Dwyane Dee

## 2016-01-10 NOTE — Assessment & Plan Note (Signed)
Overall BP well controlled Continue current medications

## 2016-01-10 NOTE — Assessment & Plan Note (Signed)
On atorvastatin 20 mg daily Check lipid panel

## 2016-01-10 NOTE — Assessment & Plan Note (Signed)
cmp today 

## 2016-01-10 NOTE — Progress Notes (Signed)
Pre visit review using our clinic review tool, if applicable. No additional management support is needed unless otherwise documented below in the visit note. 

## 2016-01-10 NOTE — Telephone Encounter (Signed)
Patient called and said that the [facility] would be sending some paper work over today for Dr. Quay Burow to fill out. He states if we do not get the paper work today could we please call him and let him know. Thank you.

## 2016-01-10 NOTE — Patient Instructions (Addendum)
Noah Barnes , Thank you for taking time to come for your Medicare Wellness Visit. I appreciate your ongoing commitment to your health goals. Please review the following plan we discussed and let me know if I can assist you in the future.   These are the goals we discussed: Goals    . Exercise 3x per week (30 min per time)     Work on weight loss       This is a list of the screening recommended for you and due dates:  Health Maintenance  Topic Date Due  . Tetanus Vaccine  01/03/1966  . Shingles Vaccine  01/04/2007  . Pneumonia vaccines (1 of 2 - PCV13) 01/04/2012  . Eye exam for diabetics  12/27/2014  . Complete foot exam   01/05/2016  . Urine Protein Check  01/05/2016  . Flu Shot  02/01/2016  . Hemoglobin A1C  03/18/2016  . Colon Cancer Screening  03/04/2019  .  Hepatitis C: One time screening is recommended by Center for Disease Control  (CDC) for  adults born from [birth year] through 1964.   Completed   Health Maintenance, Male A healthy lifestyle and preventative care can promote health and wellness.  Maintain regular health, dental, and eye exams.  Eat a healthy diet. Foods like vegetables, fruits, whole grains, low-fat dairy products, and lean protein foods contain the nutrients you need and are low in calories. Decrease your intake of foods high in solid fats, added sugars, and salt. Get information about a proper diet from your health care provider, if necessary.  Regular physical exercise is one of the most important things you can do for your health. Most adults should get at least 150 minutes of moderate-intensity exercise (any activity that increases your heart rate and causes you to sweat) each week. In addition, most adults need muscle-strengthening exercises on 2 or more days a week.   Maintain a healthy weight. The body mass index (BMI) is a screening tool to identify possible weight problems. It provides an estimate of body fat based on height and weight. Your health care  provider can find your BMI and can help you achieve or maintain a healthy weight. For males 20 years and older:  A BMI below 18.5 is considered underweight.  A BMI of 18.5 to 24.9 is normal.  A BMI of 25 to 29.9 is considered overweight.  A BMI of 30 and above is considered obese.  Maintain normal blood lipids and cholesterol by exercising and minimizing your intake of saturated fat. Eat a balanced diet with plenty of fruits and vegetables. Blood tests for lipids and cholesterol should begin at age 38 and be repeated every 5 years. If your lipid or cholesterol levels are high, you are over age 22, or you are at high risk for heart disease, you may need your cholesterol levels checked more frequently.Ongoing high lipid and cholesterol levels should be treated with medicines if diet and exercise are not working.  If you smoke, find out from your health care provider how to quit. If you do not use tobacco, do not start.  Lung cancer screening is recommended for adults aged 64-80 years who are at high risk for developing lung cancer because of a history of smoking. A yearly low-dose CT scan of the lungs is recommended for people who have at least a 30-pack-year history of smoking and are current smokers or have quit within the past 15 years. A pack year of smoking is smoking an average  of 1 pack of cigarettes a day for 1 year (for example, a 30-pack-year history of smoking could mean smoking 1 pack a day for 30 years or 2 packs a day for 15 years). Yearly screening should continue until the smoker has stopped smoking for at least 15 years. Yearly screening should be stopped for people who develop a health problem that would prevent them from having lung cancer treatment.  If you choose to drink alcohol, do not have more than 2 drinks per day. One drink is considered to be 12 oz (360 mL) of beer, 5 oz (150 mL) of wine, or 1.5 oz (45 mL) of liquor.  Avoid the use of street drugs. Do not share needles  with anyone. Ask for help if you need support or instructions about stopping the use of drugs.  High blood pressure causes heart disease and increases the risk of stroke. High blood pressure is more likely to develop in:  People who have blood pressure in the end of the normal range (100-139/85-89 mm Hg).  People who are overweight or obese.  People who are African American.  If you are 32-61 years of age, have your blood pressure checked every 3-5 years. If you are 40 years of age or older, have your blood pressure checked every year. You should have your blood pressure measured twice--once when you are at a hospital or clinic, and once when you are not at a hospital or clinic. Record the average of the two measurements. To check your blood pressure when you are not at a hospital or clinic, you can use:  An automated blood pressure machine at a pharmacy.  A home blood pressure monitor.  If you are 45-69 years old, ask your health care provider if you should take aspirin to prevent heart disease.  Diabetes screening involves taking a blood sample to check your fasting blood sugar level. This should be done once every 3 years after age 9 if you are at a normal weight and without risk factors for diabetes. Testing should be considered at a younger age or be carried out more frequently if you are overweight and have at least 1 risk factor for diabetes.  Colorectal cancer can be detected and often prevented. Most routine colorectal cancer screening begins at the age of 74 and continues through age 6. However, your health care provider may recommend screening at an earlier age if you have risk factors for colon cancer. On a yearly basis, your health care provider may provide home test kits to check for hidden blood in the stool. A small camera at the end of a tube may be used to directly examine the colon (sigmoidoscopy or colonoscopy) to detect the earliest forms of colorectal cancer. Talk to your  health care provider about this at age 53 when routine screening begins. A direct exam of the colon should be repeated every 5-10 years through age 56, unless early forms of precancerous polyps or small growths are found.  People who are at an increased risk for hepatitis B should be screened for this virus. You are considered at high risk for hepatitis B if:  You were born in a country where hepatitis B occurs often. Talk with your health care provider about which countries are considered high risk.  Your parents were born in a high-risk country and you have not received a shot to protect against hepatitis B (hepatitis B vaccine).  You have HIV or AIDS.  You use needles to  inject street drugs.  You live with, or have sex with, someone who has hepatitis B.  You are a man who has sex with other men (MSM).  You get hemodialysis treatment.  You take certain medicines for conditions like cancer, organ transplantation, and autoimmune conditions.  Hepatitis C blood testing is recommended for all people born from [birth year] through 1964 and any individual with known risk factors for hepatitis C.  Healthy men should no longer receive prostate-specific antigen (PSA) blood tests as part of routine cancer screening. Talk to your health care provider about prostate cancer screening.  Testicular cancer screening is not recommended for adolescents or adult males who have no symptoms. Screening includes self-exam, a health care provider exam, and other screening tests. Consult with your health care provider about any symptoms you have or any concerns you have about testicular cancer.  Practice safe sex. Use condoms and avoid high-risk sexual practices to reduce the spread of sexually transmitted infections (STIs).  You should be screened for STIs, including gonorrhea and chlamydia if:  You are sexually active and are younger than 24 years.  You are older than 24 years, and your health care provider tells  you that you are at risk for this type of infection.  Your sexual activity has changed since you were last screened, and you are at an increased risk for chlamydia or gonorrhea. Ask your health care provider if you are at risk.  If you are at risk of being infected with HIV, it is recommended that you take a prescription medicine daily to prevent HIV infection. This is called pre-exposure prophylaxis (PrEP). You are considered at risk if:  You are a man who has sex with other men (MSM).  You are a heterosexual man who is sexually active with multiple partners.  You take drugs by injection.  You are sexually active with a partner who has HIV.  Talk with your health care provider about whether you are at high risk of being infected with HIV. If you choose to begin PrEP, you should first be tested for HIV. You should then be tested every 3 months for as long as you are taking PrEP.  Use sunscreen. Apply sunscreen liberally and repeatedly throughout the day. You should seek shade when your shadow is shorter than you. Protect yourself by wearing long sleeves, pants, a wide-brimmed hat, and sunglasses year round whenever you are outdoors.  Tell your health care provider of new moles or changes in moles, especially if there is a change in shape or color. Also, tell your health care provider if a mole is larger than the size of a pencil eraser.  A one-time screening for abdominal aortic aneurysm (AAA) and surgical repair of large AAAs by ultrasound is recommended for men aged 63-75 years who are current or former smokers.  Stay current with your vaccines (immunizations).   This information is not intended to replace advice given to you by your health care provider. Make sure you discuss any questions you have with your health care provider.   Document Released: 12/16/2007 Document Revised: 07/10/2014 Document Reviewed: 11/14/2010 Elsevier Interactive Patient Education [employer].

## 2016-01-11 NOTE — Telephone Encounter (Signed)
Papers have been faxed to [facility], pt has been notified.

## 2016-01-15 ENCOUNTER — Encounter: Payer: Self-pay | Admitting: Internal Medicine

## 2016-02-11 ENCOUNTER — Other Ambulatory Visit: Payer: Self-pay | Admitting: Internal Medicine

## 2016-02-22 ENCOUNTER — Other Ambulatory Visit: Payer: Self-pay | Admitting: Emergency Medicine

## 2016-02-22 ENCOUNTER — Other Ambulatory Visit: Payer: Self-pay | Admitting: Internal Medicine

## 2016-02-22 ENCOUNTER — Other Ambulatory Visit: Payer: Self-pay | Admitting: Endocrinology

## 2016-02-22 MED ORDER — HYDRALAZINE HCL 25 MG PO TABS: 25.0000 mg | ORAL_TABLET | Freq: Three times a day (TID) | ORAL | 1 refills | Status: DC

## 2016-03-09 ENCOUNTER — Telehealth: Payer: Self-pay | Admitting: Internal Medicine

## 2016-03-09 NOTE — Telephone Encounter (Signed)
Left message for pt to call back  °

## 2016-03-10 NOTE — Telephone Encounter (Signed)
Pt states he is having issues with constipation and would like to be seen. Pt scheduled to see Ellouise Newer PA 03/14/16@10 :45am. Pt aware of appt.

## 2016-03-13 ENCOUNTER — Other Ambulatory Visit: Payer: Self-pay | Admitting: Internal Medicine

## 2016-03-14 ENCOUNTER — Encounter: Payer: Self-pay | Admitting: Physician Assistant

## 2016-03-14 ENCOUNTER — Ambulatory Visit (INDEPENDENT_AMBULATORY_CARE_PROVIDER_SITE_OTHER): Payer: Medicare Other | Admitting: Physician Assistant

## 2016-03-14 ENCOUNTER — Ambulatory Visit (INDEPENDENT_AMBULATORY_CARE_PROVIDER_SITE_OTHER)
Admission: RE | Admit: 2016-03-14 | Discharge: 2016-03-14 | Disposition: A | Discharge status: Home / Self Care | Payer: Medicare Other | Source: Ambulatory Visit | Attending: Physician Assistant | Admitting: Physician Assistant

## 2016-03-14 VITALS — BP 118/60 | HR 68 | Ht 68.0 in | Wt 216.0 lb

## 2016-03-14 DIAGNOSIS — R1031 Right lower quadrant pain: Secondary | ICD-10-CM

## 2016-03-14 DIAGNOSIS — K581 Irritable bowel syndrome with constipation: Secondary | ICD-10-CM

## 2016-03-14 DIAGNOSIS — K5909 Other constipation: Secondary | ICD-10-CM

## 2016-03-14 DIAGNOSIS — R1032 Left lower quadrant pain: Secondary | ICD-10-CM | POA: Diagnosis not present

## 2016-03-14 MED ORDER — POLYETHYLENE GLYCOL 3350 17 GM/SCOOP PO POWD: 1.0000 | Freq: Every day | ORAL | 3 refills | Status: DC

## 2016-03-14 NOTE — Progress Notes (Addendum)
Chief Complaint: Constipation, Abdominal Pain  HPI:   Noah Barnes is a 70 year old African-American male known to Dr. Hilarie Fredrickson with a history of IBS and chronic constipation. He has known diverticulosis, hypertension, adult onset diabetes mellitus and congestive heart failure. He was last seen in clinic on 11/02/15 by Nicoletta Ba and at that time was complaining of worsening constipation. At that time it was recommended that he use Linzess 290 g daily as well as MiraLax 17 g in 8 ounces of water daily. He was also treated with cipro and flagyl for a suspected diverticulitis.  Independent review of report and images from patient's last colonoscopy completed 03/03/14 reveals 2 sessile polyps measuring 6 and 3 mm in size at the hepatic flexure and in the descending colon, moderate diverticulosis in the ascending, descending and sigmoid colon as well as mild melanosis coli throughout the entire examined colon. Pathology revealed tubular adenomas. Repeat was recommended in 5 years.  Today, the patient tells me that he feels "quite horrible", he has been battling severe constipation over the past 2 weeks using Linzess 290 g daily as well as Amitiza on occasion, which has "not been working well". The patient tells me that he is still passing gas but has not had a bowel movement in the past 2 days. He explains that his last bowel movement was very small in size and hard. Over this time he has been becoming more bloated and developed a lower abdominal pain which radiates to his back. He also tells me that he can no longer eat anything as when he does it makes him feel nauseous. He describes that he could only have a few bites of soup last night. The patient denies ever adding MiraLAX as suggested at last appointment. It seems the only chronic constipation medicine that he uses is Linzess 290 ug on a daily basis. The patient explains that he feels like something is "blocked in there".  Patient denies fever, chills,  blood in his stool, melena, change in diet, recent change in medicines, weight loss, fatigue, heartburn, reflux or symptoms that awaken him at night.  Past Medical History:  Diagnosis Date  . Chronic diastolic heart failure ([place])   . Constipation   . Diabetes mellitus   . Diverticulosis   . ED (erectile dysfunction)   . Gout   . Hyperlipidemia   . Hypertension   . IBS (irritable bowel syndrome)   . Renal calculus   . Renal insufficiency   . Tubular adenoma of colon     Past Surgical History:  Procedure Laterality Date  . COLONOSCOPY  1998   negative; no F/U  . knee effusion tapped      Current Outpatient Prescriptions  Medication Sig Dispense Refill  . allopurinol (ZYLOPRIM) 300 MG tablet TAKE ONE TABLET BY MOUTH ONCE DAILY 30 tablet 2  . aspirin EC 81 MG tablet Take 81 mg by mouth every morning.    [name] atorvastatin (LIPITOR) 20 MG tablet Take 20 mg by mouth daily.    . cloNIDine (CATAPRES) 0.1 MG tablet Take 1 tablet (0.1 mg total) by mouth 2 (two) times daily. 180 tablet 0  . cloNIDine (CATAPRES) 0.1 MG tablet Take 1 tablet (0.1 mg total) by mouth 2 (two) times daily. 180 tablet 1  . furosemide (LASIX) 20 MG tablet Take 20-40 mg by mouth daily as needed for fluid or edema.    [name] glimepiride (AMARYL) 2 MG tablet TAKE ONE TABLET BY MOUTH ONCE DAILY BEFORE  BREAKFAST  30 tablet 5  . glucose blood (ONE TOUCH ULTRA TEST) test strip Use to test blood sugar 2 times per day ICD 10 E11.29 100 each 3  . hydrALAZINE (APRESOLINE) 25 MG tablet Take 1 tablet (25 mg total) by mouth every 8 (eight) hours. 270 tablet 1  . JANUMET 50-1000 MG tablet TAKE ONE TABLET BY MOUTH TWICE DAILY WITH MEALS 60 tablet 5  . linaclotide (LINZESS) 290 MCG CAPS capsule Take 1 capsule (290 mcg total) by mouth daily before breakfast. 30 capsule 2  . lubiprostone (AMITIZA) 24 MCG capsule Take 1 capsule (24 mcg total) by mouth 2 (two) times daily with a meal. 60 capsule 3  . metoprolol (LOPRESSOR) 100 MG tablet TAKE  ONE TABLET BY MOUTH TWICE DAILY 180 tablet 2  . ONETOUCH DELICA LANCETS FINE MISC Use to help check blood sugar twice a day Dx E11.9 100 each 3  . sildenafil (VIAGRA) 100 MG tablet take as directed 6 tablet 5  . verapamil (CALAN-SR) 240 MG CR tablet TAKE ONE-HALF TABLET BY MOUTH TWICE DAILY 90 tablet 1  . polyethylene glycol powder (GLYCOLAX/MIRALAX) powder Take 255 g by mouth daily. 255 g 3   No current facility-administered medications for this visit.     Allergies as of 03/14/2016 - Review Complete 03/14/2016  Allergen Reaction Noted  . Ace inhibitors Other (See Comments) 02/26/2015  . Angiotensin receptor blockers Other (See Comments) 02/26/2015  . Losartan Other (See Comments) 02/26/2015    Family History  Problem Relation Age of Onset  . Heart attack Mother 95  . Hypertension Mother   . Prostate cancer Father 66  . Diabetes Father   . Irritable bowel syndrome Father   . Colon cancer Brother     dx in his late 84's  . Irritable bowel syndrome Sister     x 2  . Irritable bowel syndrome Brother   . Stroke Neg Hx     Social History   Social History  . Marital status: Married    Spouse name: N/A  . Number of children: 1  . Years of education: N/A   Occupational History  . .................[name]    [phone]   Social History Main Topics  . Smoking status: Former Smoker    Packs/day: 0.30    Types: Cigarettes    Quit date: 07/04/1991  . Smokeless tobacco: Never Used     Comment: smoked age [age], up to 1/3 ppd; may be less  . Alcohol use 1.2 oz/week    2 Glasses of wine per week     Comment:  socially  . Drug use: No  . Sexual activity: Not on file   Other Topics Concern  . Not on file   Social History Narrative   Exercise: walks 1 mile three times a week    Review of Systems:     Constitutional: No weight loss, fever, chills, weakness or fatigue HEENT: Eyes: No change in vision               Ears, Nose, Throat:  No change in  hearing or congestion Skin: No rash or itching Cardiovascular: No chest pain, chest pressure or palpitations   Respiratory: No SOB or cough Gastrointestinal: See HPI and otherwise negative Genitourinary: No dysuria or change in urinary frequency Neurological: No headache, dizziness or syncope Musculoskeletal: No new muscle or joint pain Hematologic: No bleeding or bruising Psychiatric: No history of depression or anxiety    Physical Exam:  Vital  signs: BP 118/60 (BP Location: Left Arm, Patient Position: Sitting, Cuff Size: Normal)   Pulse 68   Ht 5\' 8"  (1.727 m)   Wt 216 lb (98 kg)   BMI 32.84 kg/m   General:   Pleasant Overweight African-American male appears to be in NAD, Well developed, Well nourished, alert and cooperative Head:  Normocephalic and atraumatic. Eyes:   PEERL, EOMI. No icterus. Conjunctiva pink. Ears:  Normal auditory acuity. Neck:  Supple Throat: Oral cavity and pharynx without inflammation, swelling or lesion.  Lungs: Respirations even and unlabored. Lungs clear to auscultation bilaterally.   No wheezes, crackles, or rhonchi.  Heart: Normal S1, S2. No MRG. Regular rate and rhythm. No peripheral edema, cyanosis or pallor.  Abdomen:  Soft, nondistended, mild TTP in the right lower quadrant No rebound or guarding. Normal bowel sounds. No appreciable masses or hepatomegaly. Rectal:  External exam reveals no visible hemorrhoids or masses, internal exam reveals no stool in the rectal vault, discharge brown Msk:  Symmetrical without gross deformities.  Extremities:  Without edema, no deformity or joint abnormality.  Neurologic:  Alert and  oriented x4;  grossly normal neurologically. Skin:   Dry and intact without significant lesions or rashes. Psychiatric: Oriented to person, place and time. Demonstrates good judgement and reason without abnormal affect or behaviors.  MOST RECENT LABS: CBC    Component Value Date/Time   WBC 4.6 01/10/2016 1146   RBC 5.06  01/10/2016 1146   HGB 13.6 01/10/2016 1146   HCT 42.2 01/10/2016 1146   PLT 231.0 01/10/2016 1146   MCV 83.5 01/10/2016 1146   MCH 27.0 03/03/2015 1820   MCHC 32.1 01/10/2016 1146   RDW 14.7 01/10/2016 1146   LYMPHSABS 1.4 01/10/2016 1146   MONOABS 0.4 01/10/2016 1146   EOSABS 0.1 01/10/2016 1146   BASOSABS 0.0 01/10/2016 1146    CMP     Component Value Date/Time   NA 139 01/10/2016 1146   K 5.3 (H) 01/10/2016 1146   CL 110 01/10/2016 1146   CO2 24 01/10/2016 1146   GLUCOSE 115 (H) 01/10/2016 1146   BUN 36 (H) 01/10/2016 1146   CREATININE 1.38 01/10/2016 1146   CALCIUM 9.8 01/10/2016 1146   PROT 7.5 01/10/2016 1146   ALBUMIN 4.5 01/10/2016 1146   AST 15 01/10/2016 1146   ALT 34 01/10/2016 1146   ALKPHOS 61 01/10/2016 1146   BILITOT 0.6 01/10/2016 1146   GFRNONAA 43 (L) 03/04/2015 0530   GFRAA 49 (L) 03/04/2015 0530    Assessment: 1.IBS C: Chronic for the patient, last colonoscopy in 2015 with tics and polyps and no other abnormalities, at this time patient has not had a bowel movement in 2 days and has been having severe constipation for the past 2 weeks, not helped by daily Linzess 290 g as well as occasional Amitiza , patient has a decreased appetite and " feels ill", rectal exam reveals no stool in the rectal vault 2. B/L Lower abdominal pain: Likely due to above  Plan: 1. Ordered KUB to rule out obstruction 2. Provided patient with MoviPrep bowel prep, instructed him to use this today, if he does not feel like this empties him we can send him another prescription for bowel prep 3. Recommend he start with 2 over-the-counter Phillips enemas before starting above prep 4. Patient should continue on Linzess 290 g and add MiraLAX up to 4 times daily as recommended at last visit, prescription sent for polyethylene glycol 5. Recommend the patient increase water intake to  at least 6-8 8 ounce glasses per day 6. Did have a long discussion with the patient that if he feels ill  and that he cannot eat and does not get any better after bowel prep/enemas/is not evacuating, he should proceed to the ER for further evaluation. He verbalized understanding. 7. Did schedule patient with Dr. Hilarie Fredrickson for follow-up as he has not seen him in "some time".  Ellouise Newer, PA-C [facility] 03/14/2016, 11:24 AM  Cc: Binnie Rail, MD   Addendum: Reviewed and agree with management. Jerene Bears, MD

## 2016-03-14 NOTE — Patient Instructions (Addendum)
Please go to the basement level to our radiology department for an x-ray. Clear liquid diet until you have bowel movement. Continue the Linzess 290 mcg, 1 tablet daily.  We have given  You a prep to use as a bowel purge.  There are 2 doses in the box.  You can either do both doses in one day or do one dose one day and the other dose the next morning.   We sen a prescription for the generic miralax, Polyethylene Glycol to [address].

## 2016-03-15 ENCOUNTER — Telehealth: Payer: Self-pay | Admitting: Physician Assistant

## 2016-03-15 ENCOUNTER — Telehealth: Payer: Self-pay | Admitting: *Deleted

## 2016-03-15 NOTE — Telephone Encounter (Signed)
Asked the patient how the Moviprep was working and he said he did half the prep last evening and was able to move some stool out and is going to do the other half today.  I told him to start the Miralax before bed tonight and he can take 17 grams in an 8 oz glass of water up to 4 times daily. He verbalized understanding the instructions.

## 2016-03-15 NOTE — Telephone Encounter (Signed)
[place] and advised pharmacist for the Miralax instructions, the patient is to take 1 dose, 17 grams in 8 oz of water up to 4 times daily.

## 2016-03-15 NOTE — Telephone Encounter (Signed)
Patient also called in regarding this. He states that the pharmacy gave him moviprep yesterday. Patient states that moviprep is working for him. Best # [phone]

## 2016-03-27 ENCOUNTER — Other Ambulatory Visit: Payer: Self-pay | Admitting: Internal Medicine

## 2016-05-08 ENCOUNTER — Other Ambulatory Visit: Payer: Self-pay | Admitting: Internal Medicine

## 2016-05-18 ENCOUNTER — Other Ambulatory Visit: Payer: Self-pay | Admitting: Internal Medicine

## 2016-05-22 ENCOUNTER — Other Ambulatory Visit: Payer: Self-pay | Admitting: Internal Medicine

## 2016-06-07 ENCOUNTER — Other Ambulatory Visit: Payer: Self-pay | Admitting: Endocrinology

## 2016-06-07 NOTE — Telephone Encounter (Signed)
Last office visit was 09/22/15 and has no future appointment scheduled should I refill?

## 2016-06-08 ENCOUNTER — Telehealth: Payer: Self-pay | Admitting: Endocrinology

## 2016-06-08 NOTE — Telephone Encounter (Signed)
Pt called requesting a refill of Glimepiride be sent to the [facility] on [address].

## 2016-06-09 ENCOUNTER — Other Ambulatory Visit: Payer: Self-pay

## 2016-06-09 MED ORDER — GLIMEPIRIDE 2 MG PO TABS: ORAL_TABLET | ORAL | 5 refills | Status: DC

## 2016-06-09 NOTE — Telephone Encounter (Signed)
Ordered 06/09/16

## 2016-06-11 NOTE — Telephone Encounter (Signed)
Refuse, needs to be seen

## 2016-06-23 ENCOUNTER — Telehealth: Payer: Self-pay | Admitting: Internal Medicine

## 2016-06-23 MED ORDER — LINACLOTIDE 290 MCG PO CAPS: 290.0000 ug | ORAL_CAPSULE | Freq: Every day | ORAL | 2 refills | Status: DC

## 2016-06-23 NOTE — Telephone Encounter (Signed)
Rx sent 

## 2016-07-12 ENCOUNTER — Ambulatory Visit (INDEPENDENT_AMBULATORY_CARE_PROVIDER_SITE_OTHER): Payer: Medicare Other | Admitting: Internal Medicine

## 2016-07-12 ENCOUNTER — Encounter: Payer: Self-pay | Admitting: Internal Medicine

## 2016-07-12 ENCOUNTER — Other Ambulatory Visit (INDEPENDENT_AMBULATORY_CARE_PROVIDER_SITE_OTHER): Payer: Medicare Other

## 2016-07-12 VITALS — BP 134/72 | HR 65 | Temp 97.6°F | Resp 16 | Wt 226.0 lb

## 2016-07-12 DIAGNOSIS — Z23 Encounter for immunization: Secondary | ICD-10-CM | POA: Diagnosis not present

## 2016-07-12 DIAGNOSIS — I10 Essential (primary) hypertension: Secondary | ICD-10-CM

## 2016-07-12 DIAGNOSIS — E782 Mixed hyperlipidemia: Secondary | ICD-10-CM | POA: Diagnosis not present

## 2016-07-12 DIAGNOSIS — E1122 Type 2 diabetes mellitus with diabetic chronic kidney disease: Secondary | ICD-10-CM

## 2016-07-12 DIAGNOSIS — N182 Chronic kidney disease, stage 2 (mild): Secondary | ICD-10-CM

## 2016-07-12 DIAGNOSIS — E1129 Type 2 diabetes mellitus with other diabetic kidney complication: Secondary | ICD-10-CM

## 2016-07-12 LAB — COMPREHENSIVE METABOLIC PANEL
ALT: 22 U/L (ref 0–53)
AST: 13 U/L (ref 0–37)
Albumin: 4.2 g/dL (ref 3.5–5.2)
Alkaline Phosphatase: 54 U/L (ref 39–117)
BUN: 29 mg/dL — AB (ref 6–23)
CO2: 21 mEq/L (ref 19–32)
Calcium: 9.4 mg/dL (ref 8.4–10.5)
Chloride: 110 mEq/L (ref 96–112)
Creatinine, Ser: 1.32 mg/dL (ref 0.40–1.50)
GFR: 69.06 mL/min (ref >60.00)
Glucose, Bld: 93 mg/dL (ref 70–99)
Potassium: 5.1 mEq/L (ref 3.5–5.1)
Sodium: 138 mEq/L (ref 135–145)
Total Bilirubin: 0.6 mg/dL (ref 0.2–1.2)
Total Protein: 7.1 g/dL (ref 6.0–8.3)

## 2016-07-12 LAB — LIPID PANEL
Cholesterol: 138 mg/dL (ref 0–200)
HDL: 43.3 mg/dL (ref >39.00)
LDL Cholesterol: 79 mg/dL (ref 0–99)
NONHDL: 95.17
Total CHOL/HDL Ratio: 3
Triglycerides: 80 mg/dL (ref 0.0–149.0)
VLDL: 16 mg/dL (ref 0.0–40.0)

## 2016-07-12 LAB — HEMOGLOBIN A1C: Hgb A1c MFr Bld: 5.7 % (ref 4.6–6.5)

## 2016-07-12 NOTE — Assessment & Plan Note (Signed)
cmp today 

## 2016-07-12 NOTE — Progress Notes (Signed)
Subjective:    Patient ID: Noah Barnes, male    DOB: 1945-11-09, 71 y.o.   MRN: [id number]  HPI The patient is here for follow up.  Hypertension: He is taking his medication daily. He is compliant with a low sodium diet.  He denies chest pain, palpitations, edema, shortness of breath and regular headaches. He is not exercising regularly- does not do well with the cold weather - has chest tightness.  He does not monitor his blood pressure at home.    Hyperlipidemia: He is taking his medication daily. He is compliant with a low fat/cholesterol diet. He is not exercising regularly. He denies myalgias.   Diabetes: He is taking his medication daily as prescribed. He is compliant with a diabetic diet. He is not exercising regularly. He monitors his sugars and they have been running 102 this morning, 131 last night after dinner. He checks his feet daily and denies foot lesions. He is up-to-date with an ophthalmology examination.   Gout:  He is taking his medication daily.  He denies any flare of gout.   Medications and allergies reviewed with patient and updated if appropriate.  Patient Active Problem List   Diagnosis Date Noted  . Abdominal hernia without obstruction and without gangrene 01/10/2016  . CKD stage 2 due to type 2 diabetes mellitus ([place]) 02/25/2015  . Chronic diastolic heart failure ([place]) 02/25/2015  . Right renal mass 02/25/2015  . Other constipation 02/22/2015  . Chronic venous insufficiency 01/18/2015  . Superficial phlebitis 01/18/2015  . AKI (acute kidney injury) ([place]) 11/12/2013  . IBS (irritable bowel syndrome) 11/12/2013  . Family history of prostate cancer 12/26/2011  . Diabetes mellitus with renal complications ([place]) 02/13/4817  . History of gout 12/31/2007  . HYPERLIPIDEMIA 08/14/2007  . ERECTILE DYSFUNCTION 05/09/2007  . NONSPECIFIC ABNORMAL ELECTROCARDIOGRAM 05/09/2007  . Essential hypertension 11/28/2006    Current Outpatient Prescriptions on File  Prior to Visit  Medication Sig Dispense Refill  . allopurinol (ZYLOPRIM) 300 MG tablet TAKE ONE TABLET BY MOUTH ONCE DAILY 30 tablet 5  . aspirin EC 81 MG tablet Take 81 mg by mouth every morning.    [name] atorvastatin (LIPITOR) 20 MG tablet Take 20 mg by mouth daily.    . cloNIDine (CATAPRES) 0.1 MG tablet Take 1 tablet (0.1 mg total) by mouth 2 (two) times daily. 180 tablet 1  . furosemide (LASIX) 20 MG tablet Take 20-40 mg by mouth daily as needed for fluid or edema.    [name] glimepiride (AMARYL) 2 MG tablet TAKE ONE TABLET BY MOUTH ONCE DAILY BEFORE  BREAKFAST 30 tablet 5  . glucose blood (ONE TOUCH ULTRA TEST) test strip Use to test blood sugar 2 times per day ICD 10 E11.29 100 each 3  . hydrALAZINE (APRESOLINE) 25 MG tablet Take 1 tablet (25 mg total) by mouth every 8 (eight) hours. 270 tablet 1  . JANUMET 50-1000 MG tablet TAKE ONE TABLET BY MOUTH TWICE DAILY WITH MEALS 60 tablet 5  . linaclotide (LINZESS) 290 MCG CAPS capsule Take 1 capsule (290 mcg total) by mouth daily before breakfast. 30 capsule 2  . metoprolol (LOPRESSOR) 100 MG tablet TAKE ONE TABLET BY MOUTH TWICE DAILY 180 tablet 2  . ONETOUCH DELICA LANCETS FINE MISC TEST AS DIRECTED TWICE DAILY 100 each 1  . polyethylene glycol powder (GLYCOLAX/MIRALAX) powder Take 255 g by mouth daily. 255 g 3  . verapamil (CALAN-SR) 240 MG CR tablet TAKE ONE-HALF TABLET BY MOUTH TWICE DAILY 90 tablet  1  . VIAGRA 100 MG tablet TAKE AS DIRECTED 6 tablet 3   No current facility-administered medications on file prior to visit.     Past Medical History:  Diagnosis Date  . Chronic diastolic heart failure ([place])   . Constipation   . Diabetes mellitus   . Diverticulosis   . ED (erectile dysfunction)   . Gout   . Hyperlipidemia   . Hypertension   . IBS (irritable bowel syndrome)   . Renal calculus   . Renal insufficiency   . Tubular adenoma of colon     Past Surgical History:  Procedure Laterality Date  . COLONOSCOPY  1998   negative; no F/U   . knee effusion tapped      Social History   Social History  . Marital status: Married    Spouse name: N/A  . Number of children: 1  . Years of education: N/A   Occupational History  . .................[name]    [phone]   Social History Main Topics  . Smoking status: Former Smoker    Packs/day: 0.30    Types: Cigarettes    Quit date: 07/04/1991  . Smokeless tobacco: Never Used     Comment: smoked age [age], up to 1/3 ppd; may be less  . Alcohol use 1.2 oz/week    2 Glasses of wine per week     Comment:  socially  . Drug use: No  . Sexual activity: Not on file   Other Topics Concern  . Not on file   Social History Narrative   Exercise: walks 1 mile three times a week    Family History  Problem Relation Age of Onset  . Heart attack Mother 46  . Hypertension Mother   . Prostate cancer Father 42  . Diabetes Father   . Irritable bowel syndrome Father   . Colon cancer Brother     dx in his late 35's  . Irritable bowel syndrome Sister     x 2  . Irritable bowel syndrome Brother   . Stroke Neg Hx     Review of Systems  Constitutional: Negative for fever.  Respiratory: Negative for cough, shortness of breath and wheezing.   Cardiovascular: Positive for leg swelling (occ, mild). Negative for chest pain and palpitations.  Neurological: Positive for headaches (rare). Negative for dizziness and light-headedness.       Objective:   Vitals:   07/12/16 1030  BP: 134/72  Pulse: 65  Resp: 16  Temp: 97.6 F (36.4 C)   Wt Readings from Last 3 Encounters:  07/12/16 226 lb (102.5 kg)  03/14/16 216 lb (98 kg)  01/10/16 237 lb (107.5 kg)   Body mass index is 34.36 kg/m.   Physical Exam    Constitutional: Appears well-developed and well-nourished. No distress.  HENT:  Head: Normocephalic and atraumatic.  Neck: Neck supple. No tracheal deviation present. No thyromegaly present.  No cervical lymphadenopathy Cardiovascular: Normal rate,  regular rhythm and normal heart sounds.   No murmur heard. No carotid bruit .  No edema Pulmonary/Chest: Effort normal and breath sounds normal. No respiratory distress. No has no wheezes. No rales.  Skin: Skin is warm and dry. Not diaphoretic.  Psychiatric: Normal mood and affect. Behavior is normal.      Assessment & Plan:    See Problem List for Assessment and Plan of chronic medical problems.

## 2016-07-12 NOTE — Assessment & Plan Note (Signed)
Check lipid panel  Continue daily statin Regular exercise and healthy diet encouraged  

## 2016-07-12 NOTE — Progress Notes (Signed)
Pre visit review using our clinic review tool, if applicable. No additional management support is needed unless otherwise documented below in the visit note. 

## 2016-07-12 NOTE — Patient Instructions (Addendum)

## 2016-07-12 NOTE — Assessment & Plan Note (Signed)
BP well controlled Current regimen effective and well tolerated Continue current medications at current doses cmp  

## 2016-07-12 NOTE — Assessment & Plan Note (Signed)
Will see Dr Dwyane Dee soon Will check a1c Sugars controlled at home Not exercising ( does not exercise in cold weather) Continue current medications - managed by Dr Dwyane Dee

## 2016-07-27 ENCOUNTER — Other Ambulatory Visit: Payer: Self-pay | Admitting: Internal Medicine

## 2016-08-06 IMAGING — CT CT ABD-PEL WO/W CM
3 of 11 series · 13 of 46 positions shown, 19 images · IV contrast (omnipaque)
Comparison: Renal ultrasound dated 02/24/2015

CLINICAL DATA: Evaluate right renal mass on ultrasound

EXAM:
CT ABDOMEN AND PELVIS WITHOUT AND WITH CONTRAST
TECHNIQUE: Multidetector CT imaging of the abdomen and pelvis was performed
following the standard protocol before and following the bolus
administration of intravenous contrast.
CONTRAST:  80mL OMNIPAQUE IOHEXOL 300 MG/ML  SOLN

[Series 2: renal w/o · axial · non-contrast · 0.79mm/px · z∈[-248,-78]mm · 4 of 97 slices shown]
[im 20/97  soft-tissue]
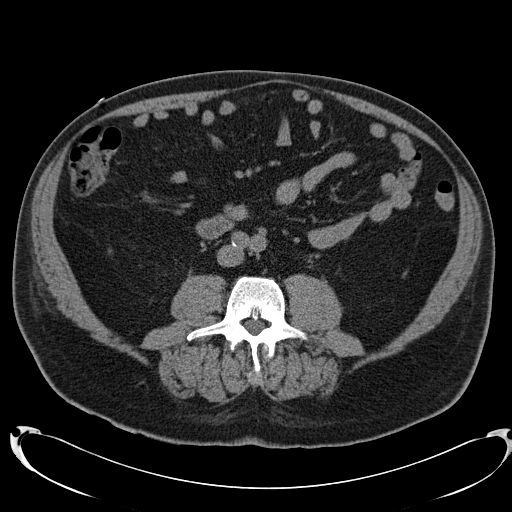
[im 39/97  soft-tissue]
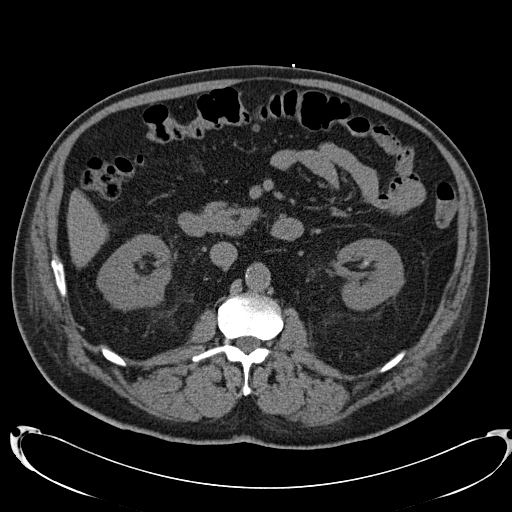
[im 58/97  soft-tissue]
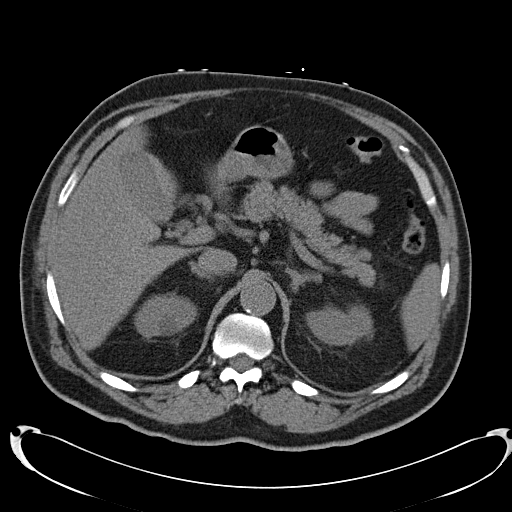
[im 77/97  soft-tissue]
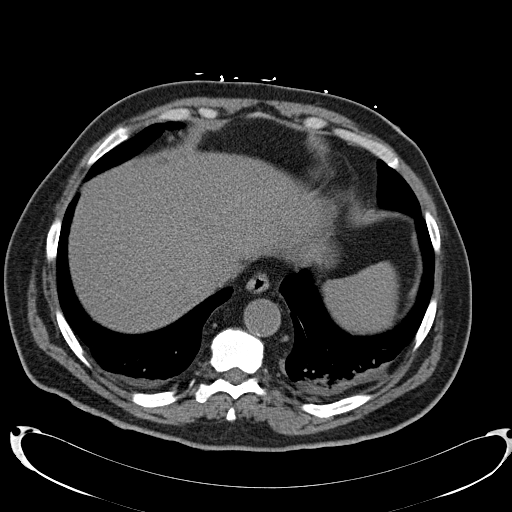

[Series 6: venous · axial · portal-venous · 0.79mm/px · z∈[-432,-78]mm · 7 of 158 slices shown, 12 images]
[im 20/158  soft-tissue]
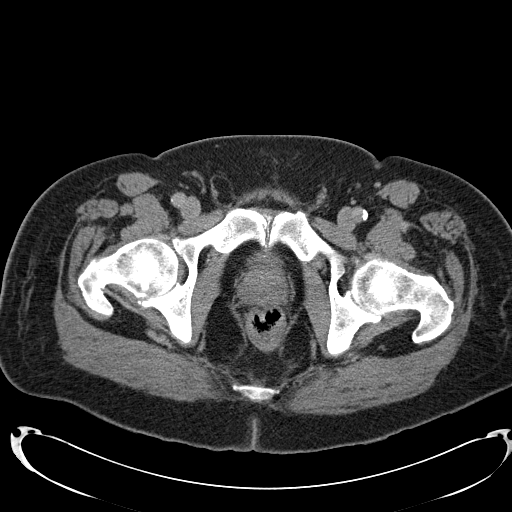
[im 20/158  bone]
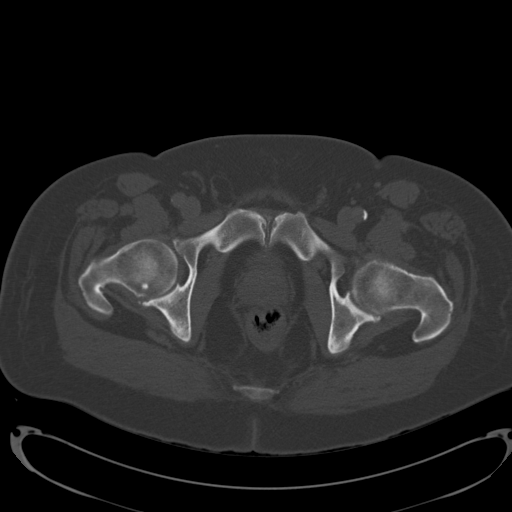
[im 40/158  soft-tissue]
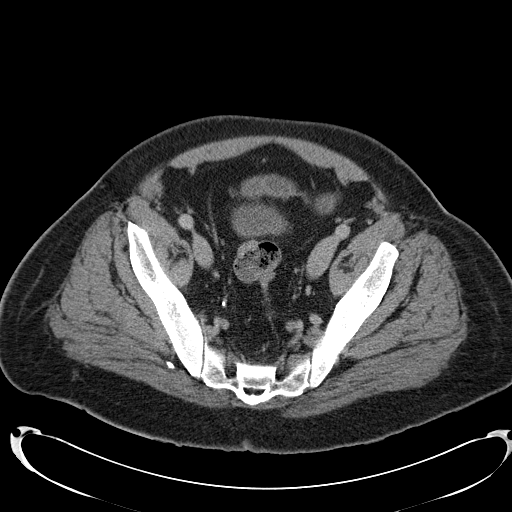
[im 59/158  soft-tissue]
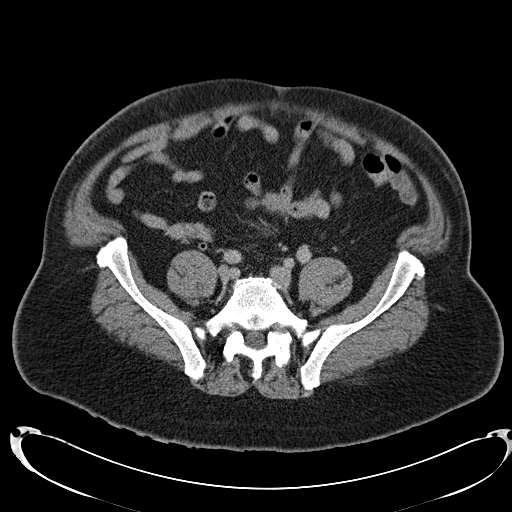
[im 79/158  soft-tissue]
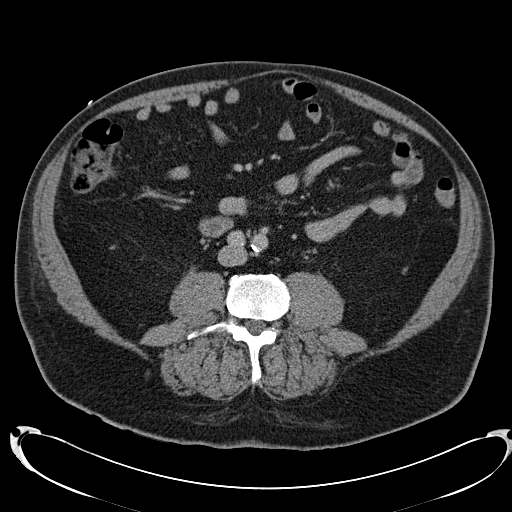
[im 79/158  lung]
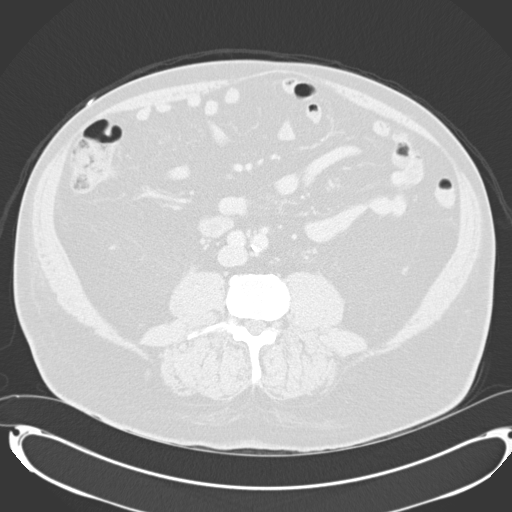
[im 99/158  soft-tissue]
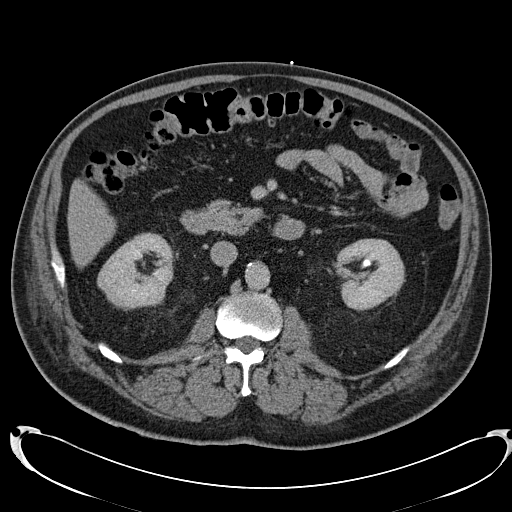
[im 99/158  lung]
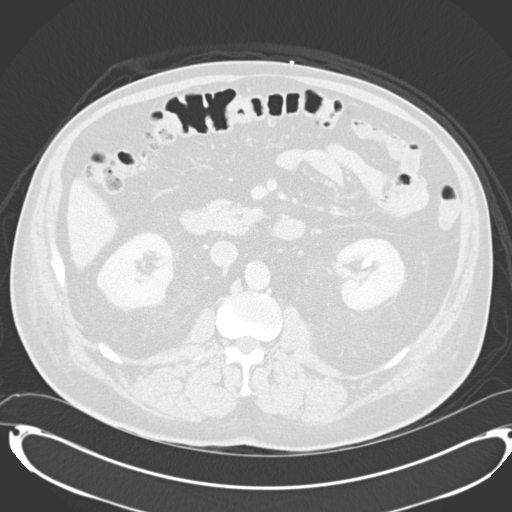
[im 118/158  soft-tissue]
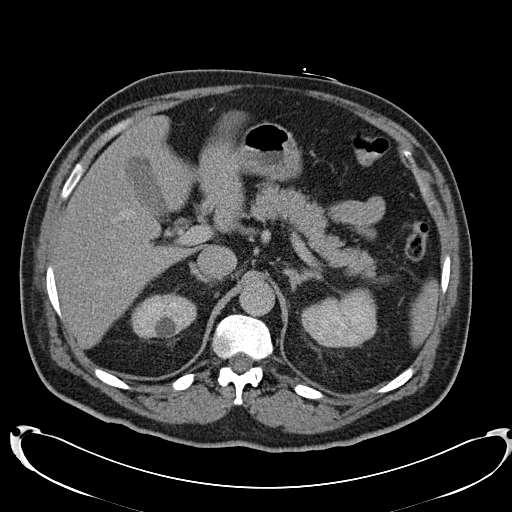
[im 118/158  lung]
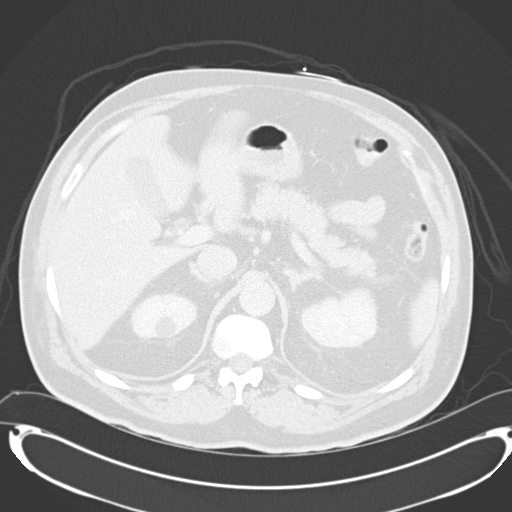
[im 138/158  soft-tissue]
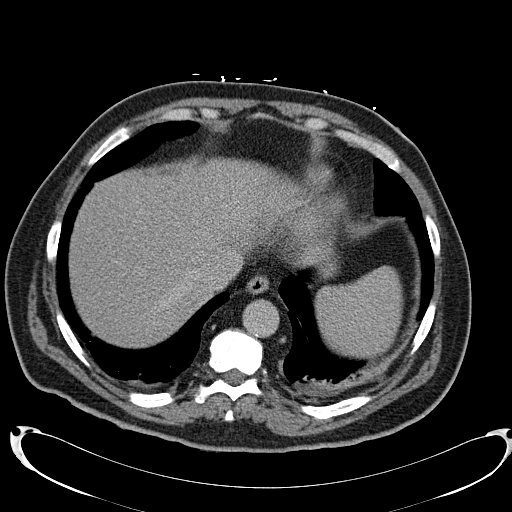
[im 138/158  lung]
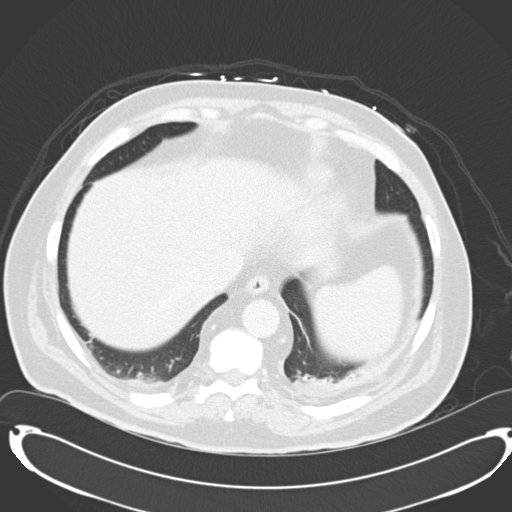

[Series 602: <mpr thick range> · coronal · 0.79mm/px · 2 of 98 slices shown, 3 images]
[im 33/98  soft-tissue]
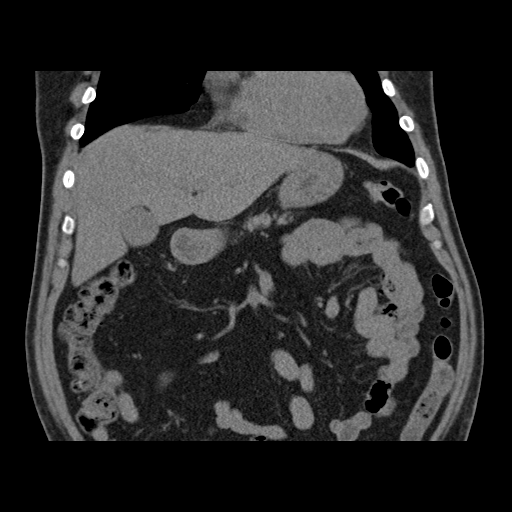
[im 33/98  bone]
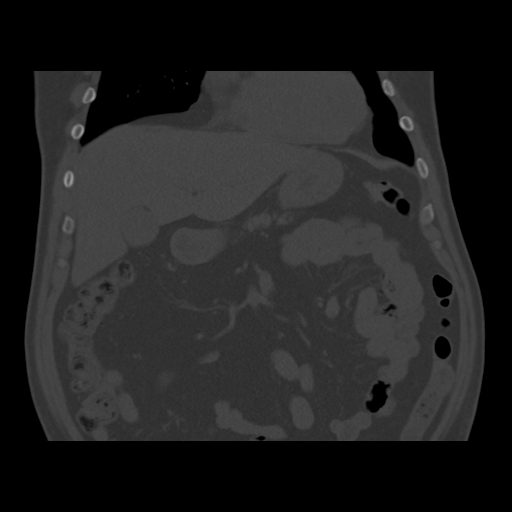
[im 65/98  soft-tissue]
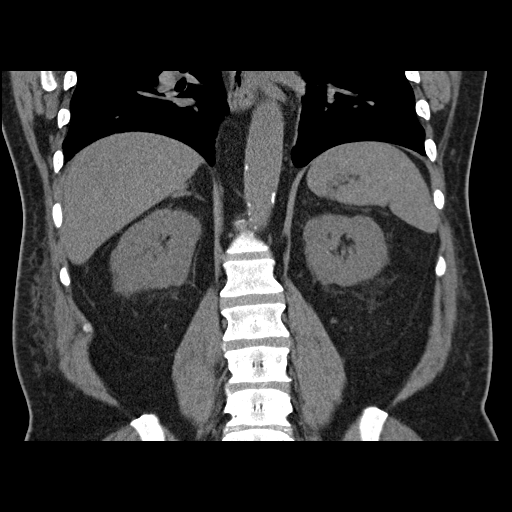

[13 of 46 positions shown; findings below may reference images not displayed]

FINDINGS: Lower chest: Trace bilateral pleural effusions. Associated dependent
atelectasis in the bilateral lower lobes.

Hepatobiliary: Liver is within normal limits. No
suspicious/enhancing hepatic lesions.

Gallbladder sludge. No associated inflammatory changes. No
intrahepatic or extrahepatic ductal dilatation.

Pancreas: Within normal limits.

Spleen: Within normal limits.

Adrenals/Urinary Tract: Adrenal glands are within normal limits.

1.7 cm right upper pole renal cyst (series 4/ image 39). No
enhancing renal lesions.

7 mm left lower pole nonobstructing renal calculus (series 2/ image
61). No ureteral or bladder calculi. No hydronephrosis.

Bladder is within normal limits.

Stomach/Bowel: Stomach is within normal limits.

No evidence of bowel obstruction.

Normal appendix.

Colonic diverticulosis, without evidence of diverticulitis.

Vascular/Lymphatic: Atherosclerotic calcifications of the abdominal
aorta and branch vessels.

No suspicious abdominopelvic lymphadenopathy.

Reproductive: Prostate is unremarkable.

Other: No abdominopelvic ascites.

Small fat containing right inguinal hernia.

Musculoskeletal: Degenerative changes of the visualized
thoracolumbar spine.
IMPRESSION: 1.7 cm right upper pole renal cyst.  No enhancing renal lesions.

7 mm nonobstructing left lower pole renal calculus. No ureteral or
bladder calculi. No hydronephrosis.

Additional ancillary findings as above.

## 2016-08-14 ENCOUNTER — Other Ambulatory Visit: Payer: Self-pay | Admitting: Internal Medicine

## 2016-08-14 ENCOUNTER — Telehealth: Payer: Self-pay | Admitting: Internal Medicine

## 2016-08-14 NOTE — Telephone Encounter (Signed)
Would perform bowel purge today with sample SuPrep or Moviprep Have him call if not able to have effective BM

## 2016-08-14 NOTE — Telephone Encounter (Signed)
Pt states he is having the same issue he had in September when he was seen for chronic constipation. He states he is taking linzess 247mcg along with 2 daily doses of miralax and some stool softeners. Pt states he passed a little bit of stool yesterday morning and a little bit this morning. He was given moviprep in September and wants to know if he needs to be seen or if he can just do another bowel prep. Please advise.

## 2016-08-14 NOTE — Telephone Encounter (Signed)
Pt aware and moviprep sample left up front for pt to pick up.

## 2016-08-25 ENCOUNTER — Other Ambulatory Visit: Payer: Self-pay

## 2016-08-25 MED ORDER — SITAGLIPTIN PHOS-METFORMIN HCL 50-1000 MG PO TABS: 1.0000 | ORAL_TABLET | Freq: Two times a day (BID) | ORAL | 5 refills | Status: DC

## 2016-09-02 ENCOUNTER — Ambulatory Visit (INDEPENDENT_AMBULATORY_CARE_PROVIDER_SITE_OTHER): Payer: Medicare Other | Admitting: Family Medicine

## 2016-09-02 ENCOUNTER — Encounter: Payer: Self-pay | Admitting: Family Medicine

## 2016-09-02 VITALS — BP 140/68 | HR 55 | Temp 98.0°F | Resp 13 | Wt 227.8 lb

## 2016-09-02 DIAGNOSIS — J069 Acute upper respiratory infection, unspecified: Secondary | ICD-10-CM | POA: Diagnosis not present

## 2016-09-02 DIAGNOSIS — R0781 Pleurodynia: Secondary | ICD-10-CM

## 2016-09-02 NOTE — Progress Notes (Signed)
Pre visit review using our clinic review tool, if applicable. No additional management support is needed unless otherwise documented below in the visit note. 

## 2016-09-02 NOTE — Progress Notes (Signed)
Subjective:    Patient ID: Noah Barnes, male    DOB: 02-19-1946, 71 y.o.   MRN: [id number]  HPI This is a 71 yo male who presents today with chest congestion (ponting to lower anterior ribs), chest tightness, little cough with colorless phlegm. No fever. Feels tight when he takes a deep breath. Sore over ribs. No SOB, no wheezing. Felt uncomfortable last night when rolling over. A little clear nasal drainage, no sore throat or ear pain. No unusual activity. Checks blood sugars at home, runs low 100s in the morning. Last HgbA1C 5.7. Has been taking mucinex.   Past Medical History:  Diagnosis Date  . Chronic diastolic heart failure ([place])   . Constipation   . Diabetes mellitus   . Diverticulosis   . ED (erectile dysfunction)   . Gout   . Hyperlipidemia   . Hypertension   . IBS (irritable bowel syndrome)   . Renal calculus   . Renal insufficiency   . Tubular adenoma of colon    Past Surgical History:  Procedure Laterality Date  . COLONOSCOPY  1998   negative; no F/U  . knee effusion tapped     Family History  Problem Relation Age of Onset  . Heart attack Mother 83  . Hypertension Mother   . Prostate cancer Father 43  . Diabetes Father   . Irritable bowel syndrome Father   . Colon cancer Brother     dx in his late 73's  . Irritable bowel syndrome Sister     x 2  . Irritable bowel syndrome Brother   . Stroke Neg Hx    Social History  Substance Use Topics  . Smoking status: Former Smoker    Packs/day: 0.30    Types: Cigarettes    Quit date: 07/04/1991  . Smokeless tobacco: Never Used     Comment: smoked age [age], up to 1/3 ppd; may be less  . Alcohol use 1.2 oz/week    2 Glasses of wine per week     Comment: rare      Review of Systems  Constitutional: Negative for fever.  HENT: Positive for rhinorrhea (little, clear). Negative for ear pain and sore throat.   Respiratory: Positive for cough (little, clear phlegm). Negative for shortness of breath and wheezing.    Gastrointestinal: Positive for constipation (chronic on Linzess, no changes).       Objective:   Physical Exam  Constitutional: He is oriented to person, place, and time. He appears well-developed and well-nourished. No distress.  Obese.    HENT:  Head: Normocephalic and atraumatic.  Right Ear: Tympanic membrane, external ear and ear canal normal.  Left Ear: Tympanic membrane, external ear and ear canal normal.  Nose: Nose normal.  Mouth/Throat: Oropharynx is clear and moist. No oropharyngeal exudate.  Eyes: Conjunctivae are normal.  Cardiovascular: Regular rhythm and normal heart sounds.  Bradycardia present.   Pulmonary/Chest: Breath sounds normal. No respiratory distress. He has no wheezes. He has no rales. He exhibits tenderness (over anterior ribs).  Neurological: He is alert and oriented to person, place, and time.  Skin: Skin is warm. He is not diaphoretic.  Psychiatric: He has a normal mood and affect. His behavior is normal. Judgment and thought content normal.  Vitals reviewed.     BP 140/68 (BP Location: Left Arm, Patient Position: Sitting, Cuff Size: Normal)   Pulse (!) 55   Temp 98 F (36.7 C) (Oral)   Resp 13   Wt 227 lb  12 oz (103.3 kg)   SpO2 96%   BMI 34.63 kg/m  Wt Readings from Last 3 Encounters:  09/02/16 227 lb 12 oz (103.3 kg)  07/12/16 226 lb (102.5 kg)  03/14/16 216 lb (98 kg)   BP Readings from Last 3 Encounters:  09/02/16 140/68  07/12/16 134/72  03/14/16 118/60       Assessment & Plan:  1. URI with cough and congestion - Provided written and verbal information regarding diagnosis and treatment. - continue mucinex - exam and history reassuring for no worrisome process - follow up if no improvement in 3 days or if worsening symptoms  2. Rib pain - tender to palpation, encouraged him to use OTC analgesics, heat, gentle ROM   Clarene Reamer, FNP-BC  [facility]  09/02/2016  9:18 AM

## 2016-09-02 NOTE — Patient Instructions (Signed)
  It was a pleasure to see you today  I think you likely have a virus and possibly some muscle strain of your rib area Continue to take mucinex, take some tylenol or ibuprofen for pain, drink enough water to make your urine light yellow Let us know if you are not feeling better by Monday or if you develop a fever, more cough or phlegm.

## 2016-09-05 ENCOUNTER — Other Ambulatory Visit (INDEPENDENT_AMBULATORY_CARE_PROVIDER_SITE_OTHER): Payer: Medicare Other

## 2016-09-05 ENCOUNTER — Ambulatory Visit: Payer: Medicare Other | Admitting: Endocrinology

## 2016-09-05 ENCOUNTER — Encounter: Payer: Self-pay | Admitting: Internal Medicine

## 2016-09-05 ENCOUNTER — Ambulatory Visit (INDEPENDENT_AMBULATORY_CARE_PROVIDER_SITE_OTHER)
Admission: RE | Admit: 2016-09-05 | Discharge: 2016-09-05 | Disposition: A | Discharge status: Home / Self Care | Payer: Medicare Other | Source: Ambulatory Visit | Attending: Internal Medicine | Admitting: Internal Medicine

## 2016-09-05 ENCOUNTER — Ambulatory Visit (INDEPENDENT_AMBULATORY_CARE_PROVIDER_SITE_OTHER): Payer: Medicare Other | Admitting: Internal Medicine

## 2016-09-05 VITALS — BP 138/80 | HR 57 | Temp 97.9°F | Ht 67.5 in | Wt 224.0 lb

## 2016-09-05 DIAGNOSIS — R1011 Right upper quadrant pain: Secondary | ICD-10-CM

## 2016-09-05 DIAGNOSIS — I10 Essential (primary) hypertension: Secondary | ICD-10-CM

## 2016-09-05 DIAGNOSIS — I5032 Chronic diastolic (congestive) heart failure: Secondary | ICD-10-CM

## 2016-09-05 LAB — CBC WITH DIFFERENTIAL/PLATELET
Basophils Absolute: 0.1 10*3/uL (ref 0.0–0.1)
Basophils Relative: 1.3 % (ref 0.0–3.0)
Eosinophils Absolute: 0.1 10*3/uL (ref 0.0–0.7)
Eosinophils Relative: 2.2 % (ref 0.0–5.0)
HCT: 41.8 % (ref 39.0–52.0)
Hemoglobin: 13.3 g/dL (ref 13.0–17.0)
LYMPHS PCT: 28.5 % (ref 12.0–46.0)
Lymphs Abs: 1.3 10*3/uL (ref 0.7–4.0)
MCHC: 31.9 g/dL (ref 30.0–36.0)
MCV: 84.2 fl (ref 78.0–100.0)
MONOS PCT: 8.7 % (ref 3.0–12.0)
Monocytes Absolute: 0.4 10*3/uL (ref 0.1–1.0)
NEUTROS ABS: 2.7 10*3/uL (ref 1.4–7.7)
Neutrophils Relative %: 59.3 % (ref 43.0–77.0)
Platelets: 219 10*3/uL (ref 150.0–400.0)
RBC: 4.96 Mil/uL (ref 4.22–5.81)
RDW: 15.5 % (ref 11.5–15.5)
WBC: 4.6 10*3/uL (ref 4.0–10.5)

## 2016-09-05 LAB — URINALYSIS, ROUTINE W REFLEX MICROSCOPIC
BILIRUBIN URINE: NEGATIVE
Hgb urine dipstick: NEGATIVE
Ketones, ur: NEGATIVE
Leukocytes, UA: NEGATIVE
Nitrite: NEGATIVE
PH: 5.5 (ref 5.0–8.0)
RBC / HPF: NONE SEEN (ref 0–?)
Specific Gravity, Urine: >=1.03 — AB (ref 1.000–1.030)
Urine Glucose: NEGATIVE
Urobilinogen, UA: 0.2 (ref 0.0–1.0)

## 2016-09-05 LAB — LIPASE: Lipase: 77 U/L — ABNORMAL HIGH (ref 11.0–59.0)

## 2016-09-05 LAB — BASIC METABOLIC PANEL
BUN: 29 mg/dL — ABNORMAL HIGH (ref 6–23)
CO2: 22 mEq/L (ref 19–32)
Calcium: 9.4 mg/dL (ref 8.4–10.5)
Chloride: 113 mEq/L — ABNORMAL HIGH (ref 96–112)
Creatinine, Ser: 1.38 mg/dL (ref 0.40–1.50)
GFR: 65.57 mL/min (ref >60.00)
Glucose, Bld: 112 mg/dL — ABNORMAL HIGH (ref 70–99)
Potassium: 4.9 mEq/L (ref 3.5–5.1)
Sodium: 140 mEq/L (ref 135–145)

## 2016-09-05 LAB — HEPATIC FUNCTION PANEL
ALT: 33 U/L (ref 0–53)
AST: 17 U/L (ref 0–37)
Albumin: 4.3 g/dL (ref 3.5–5.2)
Alkaline Phosphatase: 62 U/L (ref 39–117)
BILIRUBIN DIRECT: 0.1 mg/dL (ref 0.0–0.3)
Total Bilirubin: 0.6 mg/dL (ref 0.2–1.2)
Total Protein: 7.2 g/dL (ref 6.0–8.3)

## 2016-09-05 MED ORDER — TIZANIDINE HCL 2 MG PO TABS: 2.0000 mg | ORAL_TABLET | Freq: Four times a day (QID) | ORAL | 1 refills | Status: DC | PRN

## 2016-09-05 NOTE — Progress Notes (Signed)
Subjective:    Patient ID: Noah Barnes, male    DOB: 21-Nov-1945, 71 y.o.   MRN: [id number]  HPI  Here with acute onset right sided/RUQ pain for 1 wk, mod severe but sometimes mild, constant, but worse with moving, standing up, twisting and bending as well as deep breaths.   No trauma, no rash or other skin redness or swelling.   Pt denies fever, wt loss, night sweats, loss of appetite, or other constitutional symptoms.  Does have occas small reflux symptoms but no dysphagia, n/v, or blood.  Sees Dr Pyrtle/GI with IBS related constipation , chronic persistent per pt, not really worse, takes linzess daily.  Last BM yesterday - a bit more watery than loose, but has been using stool softner with linzess.  No blood.   Son is OB'GYN who advised him for tests.  No recent lifting or unusual actifivty. Past Medical History:  Diagnosis Date  . Chronic diastolic heart failure ([place])   . Constipation   . Diabetes mellitus   . Diverticulosis   . ED (erectile dysfunction)   . Gout   . Hyperlipidemia   . Hypertension   . IBS (irritable bowel syndrome)   . Renal calculus   . Renal insufficiency   . Tubular adenoma of colon    Past Surgical History:  Procedure Laterality Date  . COLONOSCOPY  1998   negative; no F/U  . knee effusion tapped      reports that he quit smoking about 25 years ago. His smoking use included Cigarettes. He smoked 0.30 packs per day. He has never used smokeless tobacco. He reports that he drinks about 1.2 oz of alcohol per week . He reports that he does not use drugs. family history includes Colon cancer in his brother; Diabetes in his father; Heart attack (age of onset: 27) in his mother; Hypertension in his mother; Irritable bowel syndrome in his brother, father, and sister; Prostate cancer (age of onset: 24) in his father. Allergies  Allergen Reactions  . Ace Inhibitors Other (See Comments)    Severe AKI due to ARB + dehydration Aug 2016  . Angiotensin Receptor Blockers  Other (See Comments)    Severe AKI due to ARB + dehydration Aug 2016  . Losartan Other (See Comments)    Severe AKI due to ARB + dehydration Aug 2016   Current Outpatient Prescriptions on File Prior to Visit  Medication Sig Dispense Refill  . allopurinol (ZYLOPRIM) 300 MG tablet TAKE ONE TABLET BY MOUTH ONCE DAILY 30 tablet 5  . aspirin EC 81 MG tablet Take 81 mg by mouth every morning.    [name] atorvastatin (LIPITOR) 20 MG tablet Take 20 mg by mouth daily.    . cloNIDine (CATAPRES) 0.1 MG tablet Take 1 tablet (0.1 mg total) by mouth 2 (two) times daily. 180 tablet 1  . furosemide (LASIX) 20 MG tablet Take 20-40 mg by mouth daily as needed for fluid or edema.    [name] glimepiride (AMARYL) 2 MG tablet TAKE ONE TABLET BY MOUTH ONCE DAILY BEFORE  BREAKFAST 30 tablet 5  . glucose blood (ONE TOUCH ULTRA TEST) test strip Use to test blood sugar 2 times per day ICD 10 E11.29 100 each 3  . hydrALAZINE (APRESOLINE) 25 MG tablet Take 1 tablet (25 mg total) by mouth every 8 (eight) hours. 270 tablet 1  . linaclotide (LINZESS) 290 MCG CAPS capsule Take 1 capsule (290 mcg total) by mouth daily before breakfast. 30 capsule 2  .  metoprolol (LOPRESSOR) 100 MG tablet TAKE ONE TABLET BY MOUTH TWICE DAILY 180 tablet 2  . ONETOUCH DELICA LANCETS FINE MISC TEST AS DIRECTED TWICE DAILY 100 each 1  . polyethylene glycol powder (GLYCOLAX/MIRALAX) powder Take 255 g by mouth daily. 255 g 3  . sitaGLIPtin-metformin (JANUMET) 50-1000 MG tablet Take 1 tablet by mouth 2 (two) times daily with a meal. 60 tablet 5  . verapamil (CALAN-SR) 240 MG CR tablet TAKE ONE-HALF TABLET BY MOUTH TWICE DAILY 90 tablet 1  . VIAGRA 100 MG tablet TAKE AS DIRECTED 6 tablet 3   No current facility-administered medications on file prior to visit.    Review of Systems  Constitutional: Negative for unusual diaphoresis or night sweats HENT: Negative for ear swelling or discharge Eyes: Negative for worsening visual haziness  Respiratory: Negative  for choking and stridor.   Gastrointestinal: Negative for distension or worsening eructation Genitourinary: Negative for retention or change in urine volume.  Musculoskeletal: Negative for other MSK pain or swelling Skin: Negative for color change and worsening wound Neurological: Negative for tremors and numbness other than noted  Psychiatric/Behavioral: Negative for decreased concentration or agitation other than above   All other system neg per pt    Objective:   Physical Exam BP 138/80   Pulse (!) 57   Temp 97.9 F (36.6 C)   Ht 5' 7.5" (1.715 m)   Wt 224 lb (101.6 kg)   SpO2 98%   BMI 34.57 kg/m  VS noted,  Constitutional: Pt appears in no apparent distress HENT: Head: NCAT.  Right Ear: External ear normal.  Left Ear: External ear normal.  Eyes: . Pupils are equal, round, and reactive to light. Conjunctivae and EOM are normal Neck: Normal range of motion. Neck supple.  Cardiovascular: Normal rate and regular rhythm.   Pulmonary/Chest: Effort normal and breath sounds without rales or wheezing.  Abd:  Soft, NT, ND, + BS with vague mild tender to right side/RUQ to the anterior axillary line about t10-t12 Neurological: Pt is alert. Not confused , motor grossly intact Skin: Skin is warm. No rash, no LE edema Psychiatric: Pt behavior is normal. No agitation. mild nervous No other exam findings  Lab Results  Component Value Date   WBC 4.6 01/10/2016   HGB 13.6 01/10/2016   HCT 42.2 01/10/2016   PLT 231.0 01/10/2016   GLUCOSE 93 07/12/2016   CHOL 138 07/12/2016   TRIG 80.0 07/12/2016   HDL 43.30 07/12/2016   LDLDIRECT 80.1 04/15/2014   LDLCALC 79 07/12/2016   ALT 22 07/12/2016   AST 13 07/12/2016   NA 138 07/12/2016   K 5.1 07/12/2016   CL 110 07/12/2016   CREATININE 1.32 07/12/2016   BUN 29 (H) 07/12/2016   CO2 21 07/12/2016   TSH 1.61 01/10/2016   PSA 0.56 11/13/2012   HGBA1C 5.7 07/12/2016   MICROALBUR 5.2 (H) 01/10/2016     03/02/2015 12:44 PM 03/02/2015  1:27 PM  Study Result   CLINICAL DATA:  Evaluate right renal mass on ultrasound  EXAM: CT ABDOMEN AND PELVIS WITHOUT AND WITH CONTRAST  TECHNIQUE: Multidetector CT imaging of the abdomen and pelvis was performed following the standard protocol before and following the bolus administration of intravenous contrast.  CONTRAST:  25mL OMNIPAQUE IOHEXOL 300 MG/ML  SOLN  COMPARISON:  Renal ultrasound dated 02/24/2015  FINDINGS: Lower chest: Trace bilateral pleural effusions. Associated dependent atelectasis in the bilateral lower lobes.  Hepatobiliary: Liver is within normal limits. No suspicious/enhancing hepatic lesions.  Gallbladder  sludge. No associated inflammatory changes. No intrahepatic or extrahepatic ductal dilatation.  Pancreas: Within normal limits.  Spleen: Within normal limits.  Adrenals/Urinary Tract: Adrenal glands are within normal limits.  1.7 cm right upper pole renal cyst (series 4/ image 39). No enhancing renal lesions.  7 mm left lower pole nonobstructing renal calculus (series 2/ image 61). No ureteral or bladder calculi. No hydronephrosis.  Bladder is within normal limits.  Stomach/Bowel: Stomach is within normal limits.  No evidence of bowel obstruction.  Normal appendix.  Colonic diverticulosis, without evidence of diverticulitis.  Vascular/Lymphatic: Atherosclerotic calcifications of the abdominal aorta and branch vessels.  No suspicious abdominopelvic lymphadenopathy.  Reproductive: Prostate is unremarkable.  Other: No abdominopelvic ascites.  Small fat containing right inguinal hernia.  Musculoskeletal: Degenerative changes of the visualized thoracolumbar spine.  IMPRESSION: 1.7 cm right upper pole renal cyst.  No enhancing renal lesions.  7 mm nonobstructing left lower pole renal calculus. No ureteral or bladder calculi. No hydronephrosis.  Additional ancillary findings as above.         Assessment & Plan:

## 2016-09-05 NOTE — Assessment & Plan Note (Signed)
Etiology unclear, suspect MSK but will need further evaluation given symptoms and age; for cxr, abd u/s, UA and labs as ordered; also empiric tizanidine prn.

## 2016-09-05 NOTE — Patient Instructions (Signed)
Please take all new medication as prescribed - the tizanidine as needed for muscle relaxer  Please continue all other medications as before, and refills have been done if requested.  Please have the pharmacy call with any other refills you may need.  Please keep your appointments with your specialists as you may have planned  You will be contacted regarding the referral for: Abdomen ultrasound  Please go to the XRAY Department in the Basement (go straight as you get off the elevator) for the x-ray testing  Please go to the LAB in the Basement (turn left off the elevator) for the tests to be done today  You will be contacted by phone if any changes need to be made immediately.  Otherwise, you will receive a letter about your results with an explanation, but please check with MyChart first.

## 2016-09-09 ENCOUNTER — Encounter (HOSPITAL_COMMUNITY): Payer: Self-pay | Admitting: Emergency Medicine

## 2016-09-09 ENCOUNTER — Ambulatory Visit (HOSPITAL_COMMUNITY)
Admission: EM | Admit: 2016-09-09 | Discharge: 2016-09-09 | Disposition: A | Discharge status: Home / Self Care | Payer: Medicare Other | Attending: Internal Medicine | Admitting: Internal Medicine

## 2016-09-09 DIAGNOSIS — J40 Bronchitis, not specified as acute or chronic: Secondary | ICD-10-CM

## 2016-09-09 DIAGNOSIS — R059 Cough, unspecified: Secondary | ICD-10-CM

## 2016-09-09 DIAGNOSIS — R05 Cough: Secondary | ICD-10-CM

## 2016-09-09 MED ORDER — BENZONATATE 100 MG PO CAPS: 200.0000 mg | ORAL_CAPSULE | Freq: Three times a day (TID) | ORAL | 0 refills | Status: DC | PRN

## 2016-09-09 MED ORDER — AZITHROMYCIN 250 MG PO TABS: 250.0000 mg | ORAL_TABLET | Freq: Every day | ORAL | 0 refills | Status: DC

## 2016-09-09 NOTE — ED Triage Notes (Signed)
Pt reports a sore throat three days ago that has progressed to nasal congestion and a cough.

## 2016-09-09 NOTE — ED Provider Notes (Signed)
CSN: [id number]     Arrival date & time 09/09/16  1201 History   First MD Initiated Contact with Patient 09/09/16 1217     Chief Complaint  Patient presents with  . URI   (Consider location/radiation/quality/duration/timing/severity/associated sxs/prior Treatment) Patient c/o sore throat x 3 days and c/o nasal congestion   The history is provided by the patient.  URI  Presenting symptoms: congestion and fatigue   Severity:  Moderate Onset quality:  Sudden Timing:  Constant Chronicity:  New Worsened by:  Nothing Ineffective treatments:  None tried   Past Medical History:  Diagnosis Date  . Chronic diastolic heart failure ([place])   . Constipation   . Diabetes mellitus   . Diverticulosis   . ED (erectile dysfunction)   . Gout   . Hyperlipidemia   . Hypertension   . IBS (irritable bowel syndrome)   . Renal calculus   . Renal insufficiency   . Tubular adenoma of colon    Past Surgical History:  Procedure Laterality Date  . COLONOSCOPY  1998   negative; no F/U  . knee effusion tapped     Family History  Problem Relation Age of Onset  . Heart attack Mother 66  . Hypertension Mother   . Prostate cancer Father 31  . Diabetes Father   . Irritable bowel syndrome Father   . Colon cancer Brother     dx in his late 11's  . Irritable bowel syndrome Sister     x 2  . Irritable bowel syndrome Brother   . Stroke Neg Hx    Social History  Substance Use Topics  . Smoking status: Former Smoker    Packs/day: 0.30    Types: Cigarettes    Quit date: 07/04/1991  . Smokeless tobacco: Never Used     Comment: smoked age [age], up to 1/3 ppd; may be less  . Alcohol use 1.2 oz/week    2 Glasses of wine per week     Comment: rare    Review of Systems  Constitutional: Positive for fatigue.  HENT: Positive for congestion.   Eyes: Negative.   Respiratory: Negative.   Cardiovascular: Negative.   Gastrointestinal: Negative.   Endocrine: Negative.   Genitourinary: Negative.    Musculoskeletal: Negative.   Allergic/Immunologic: Negative.   Neurological: Negative.   Hematological: Negative.   Psychiatric/Behavioral: Negative.     Allergies  Ace inhibitors; Angiotensin receptor blockers; and Losartan  Home Medications   Prior to Admission medications   Medication Sig Start Date End Date Taking? Authorizing Provider  allopurinol (ZYLOPRIM) 300 MG tablet TAKE ONE TABLET BY MOUTH ONCE DAILY 03/27/16  Yes Binnie Rail, MD  aspirin EC 81 MG tablet Take 81 mg by mouth every morning.   Yes Historical Provider, MD  atorvastatin (LIPITOR) 20 MG tablet Take 20 mg by mouth daily.   Yes Historical Provider, MD  cloNIDine (CATAPRES) 0.1 MG tablet Take 1 tablet (0.1 mg total) by mouth 2 (two) times daily. 03/13/16  Yes Binnie Rail, MD  furosemide (LASIX) 20 MG tablet Take 20-40 mg by mouth daily as needed for fluid or edema.   Yes Historical Provider, MD  glimepiride (AMARYL) 2 MG tablet TAKE ONE TABLET BY MOUTH ONCE DAILY BEFORE  BREAKFAST 06/09/16  Yes Elayne Snare, MD  glucose blood (ONE TOUCH ULTRA TEST) test strip Use to test blood sugar 2 times per day ICD 10 E11.29 09/22/15  Yes Elayne Snare, MD  hydrALAZINE (APRESOLINE) 25 MG tablet Take 1 tablet (  25 mg total) by mouth every 8 (eight) hours. 02/22/16  Yes Binnie Rail, MD  linaclotide Rolan Lipa) 290 MCG CAPS capsule Take 1 capsule (290 mcg total) by mouth daily before breakfast. 06/23/16  Yes Jerene Bears, MD  metoprolol (LOPRESSOR) 100 MG tablet TAKE ONE TABLET BY MOUTH TWICE DAILY 08/14/16  Yes Binnie Rail, MD  [facility] DELICA LANCETS FINE MISC TEST AS DIRECTED TWICE DAILY 05/08/16  Yes Aleksei Plotnikov V, MD  polyethylene glycol powder (GLYCOLAX/MIRALAX) powder Take 255 g by mouth daily. 03/14/16  Yes Levin Erp, PA  sitaGLIPtin-metformin (JANUMET) 50-1000 MG tablet Take 1 tablet by mouth 2 (two) times daily with a meal. 08/25/16  Yes Elayne Snare, MD  tiZANidine (ZANAFLEX) 2 MG tablet Take 1 tablet (2 mg total)  by mouth every 6 (six) hours as needed for muscle spasms. 09/05/16  Yes Biagio Borg, MD  verapamil (CALAN-SR) 240 MG CR tablet TAKE ONE-HALF TABLET BY MOUTH TWICE DAILY 07/31/16  Yes Binnie Rail, MD  VIAGRA 100 MG tablet TAKE AS DIRECTED 05/22/16  Yes Binnie Rail, MD  azithromycin (ZITHROMAX) 250 MG tablet Take 1 tablet (250 mg total) by mouth daily. Take first 2 tablets together, then 1 every day until finished. 09/09/16   Lysbeth Penner, FNP  benzonatate (TESSALON) 100 MG capsule Take 2 capsules (200 mg total) by mouth 3 (three) times daily as needed for cough. 09/09/16   Lysbeth Penner, FNP   Meds Ordered and Administered this Visit  Medications - No data to display  BP 155/75 (BP Location: Left Arm)   Pulse 68   Temp 98.3 F (36.8 C) (Oral)   SpO2 98%  No data found.   Physical Exam  Constitutional: He appears well-developed and well-nourished.  HENT:  Head: Normocephalic and atraumatic.  Right Ear: External ear normal.  Left Ear: External ear normal.  Mouth/Throat: Oropharynx is clear and moist.  Eyes: Conjunctivae and EOM are normal. Pupils are equal, round, and reactive to light.  Neck: Normal range of motion. Neck supple.  Cardiovascular: Normal rate, regular rhythm and normal heart sounds.   Pulmonary/Chest: Effort normal and breath sounds normal.  Abdominal: Soft. Bowel sounds are normal.  Musculoskeletal: Normal range of motion.  Nursing note and vitals reviewed.   Urgent Care Course     Procedures (including critical care time)  Labs Review Labs Reviewed - No data to display  Imaging Review No results found.   Visual Acuity Review  Right Eye Distance:   Left Eye Distance:   Bilateral Distance:    Right Eye Near:   Left Eye Near:    Bilateral Near:         MDM   1. Bronchitis   2. Cough    Amoxicillin 875mg  one po bid x 7 days #14  Push po fluids, rest, tylenol and motrin otc prn as directed for fever, arthralgias, and myalgias.  Follow  up prn if sx's continue or persist.    Lysbeth Penner, FNP 09/09/16 [place], [place] 09/09/16 1329

## 2016-09-10 NOTE — Assessment & Plan Note (Signed)
stable overall by history and exam, and pt to continue medical treatment as before,  to f/u any worsening symptoms or concerns 

## 2016-09-10 NOTE — Assessment & Plan Note (Signed)
elev today likely reactive, o/w stable overall by history and exam, recent data reviewed with pt, and pt to continue medical treatment as before,  to f/u any worsening symptoms or concerns BP Readings from Last 3 Encounters:  09/09/16 155/75  09/05/16 138/80  09/02/16 140/68

## 2016-09-14 ENCOUNTER — Ambulatory Visit
Admission: RE | Admit: 2016-09-14 | Discharge: 2016-09-14 | Disposition: A | Discharge status: Home / Self Care | Payer: Medicare Other | Source: Ambulatory Visit | Attending: Internal Medicine | Admitting: Internal Medicine

## 2016-09-14 DIAGNOSIS — R1011 Right upper quadrant pain: Secondary | ICD-10-CM

## 2016-09-25 ENCOUNTER — Telehealth: Payer: Self-pay | Admitting: Internal Medicine

## 2016-09-25 NOTE — Telephone Encounter (Signed)
Pharmacy did not receive Lasix, says Historical Provider. Please resend for refill to [facility] on [address].

## 2016-09-26 ENCOUNTER — Other Ambulatory Visit: Payer: Self-pay | Admitting: Internal Medicine

## 2016-09-26 MED ORDER — FUROSEMIDE 20 MG PO TABS: 20.0000 mg | ORAL_TABLET | Freq: Every day | ORAL | 2 refills | Status: DC | PRN

## 2016-09-26 NOTE — Telephone Encounter (Signed)
RX has been sent to [facility]

## 2016-09-27 ENCOUNTER — Ambulatory Visit (INDEPENDENT_AMBULATORY_CARE_PROVIDER_SITE_OTHER): Payer: Medicare Other | Admitting: Internal Medicine

## 2016-09-27 ENCOUNTER — Encounter: Payer: Self-pay | Admitting: Internal Medicine

## 2016-09-27 VITALS — BP 118/70 | HR 56 | Ht 66.75 in | Wt 226.5 lb

## 2016-09-27 DIAGNOSIS — K581 Irritable bowel syndrome with constipation: Secondary | ICD-10-CM | POA: Diagnosis not present

## 2016-09-27 DIAGNOSIS — K5909 Other constipation: Secondary | ICD-10-CM

## 2016-09-27 MED ORDER — LINACLOTIDE 290 MCG PO CAPS: 290.0000 ug | ORAL_CAPSULE | Freq: Every day | ORAL | 1 refills | Status: DC

## 2016-09-27 NOTE — Patient Instructions (Signed)
Continue Linzess and Miralax.  You may need a bowel purge every 3-4 months as needed.  You may use either a Miralax bowel purge or Moviprep bowel purge (if we have Moviprep samples available).  Dr Hilarie Fredrickson recommends that you complete a bowel purge (to clean out your bowels). Please do the following: Purchase a 255 g bottle of Miralax over the counter Drink 15 capfuls of Miralax mixed in 64 of water/juice/gatorade (you may choose which of these liquids to drink) over the next 2-3 hours. You should expect results within 1 to 6 hours after completing the bowel purge.  If you are age 70 or older, your body mass index should be between 23-30. Your Body mass index is 35.74 kg/m. If this is out of the aforementioned range listed, please consider follow up with your Primary Care Provider.  If you are age 25 or younger, your body mass index should be between 19-25. Your Body mass index is 35.74 kg/m. If this is out of the aformentioned range listed, please consider follow up with your Primary Care Provider.

## 2016-09-27 NOTE — Progress Notes (Signed)
   Subjective:    Patient ID: Noah Barnes, male    DOB: 02/13/1946, 71 y.o.   MRN: [id number]  HPI Noah Barnes is a 71 year old male with a past medical history of IBS with chronic constipation, history of colon polyps and diverticulosis who is here for follow-up. Last seen 6 months ago by Ellouise Newer, PA-C.  He has chronic constipation and intermittently requires bowel purge. He continues on Linzess 290 g daily along with MiraLAX which he varies the dose of. He's currently using this 2-3 times per week. When he uses it daily he states that his bowels "do not flow well". No blood per rectum or melena. He did have right upper quadrant pain about 3 weeks ago exacerbated by movement. He had an ultrasound of his abdomen ordered by primary care which was unremarkable except for a left nonobstructing kidney stone and a benign-appearing kidney cyst. Gallbladder look normal. Pain has resolved. Appetite continues to be good. Denies dysphagia. Denies heartburn and indigestion.  He states that when he has a bowel purge, previously with moviprep in Feb 2018, he has a good result and then his Linzess seems to work better. He estimates he needs bowel purge 3-4 times per year. Looking back this seems correct.  Review of Systems As per HPI, otherwise negative  Current Medications, Allergies, Past Medical History, Past Surgical History, Family History and Social History were reviewed in [employer] record.     Objective:   Physical Exam BP 118/70 (BP Location: Left Arm, Patient Position: Sitting, Cuff Size: Normal)   Pulse (!) 56   Ht 5' 6.75" (1.695 m) Comment: height measured without shoes  Wt 226 lb 8 oz (102.7 kg)   BMI 35.74 kg/m  Constitutional: Well-developed and well-nourished. No distress. HEENT: Normocephalic and atraumatic.  Conjunctivae are normal.  No scleral icterus. Neck: Neck supple. Trachea midline. Cardiovascular: Normal rate, regular rhythm and intact  distal pulses. No M/R/G Pulmonary/chest: Effort normal and breath sounds normal. No wheezing, rales or rhonchi. Abdominal: Soft, nontender, nondistended. Bowel sounds active throughout. Umbilical hernia nontender Extremities: no clubbing, cyanosis, or edema Neurological: Alert and oriented to person place and time. Skin: Skin is warm and dry.  Psychiatric: Normal mood and affect. Behavior is normal.      Assessment & Plan:  71 year old male with a past medical history of IBS with chronic constipation, history of colon polyps and diverticulosis who is here for follow-up.  1. Chronic constipation and IBS -- he does well with Linzess 290 g though several times per year it seems to stop working and he develops significant abdominal discomfort and distention. This responds well to bowel purge. We discussed the addition of another laxative such as Amitiza or Trulance, but he would like to continue current therapy given good results. Thus we'll continue Linzess 290 g daily. Continue MiraLAX 2-3 days per week. I am okay with periodic bowel purge as long as this is not required frequently. Should it be required frequently I would recommend we change therapy. MoviPrep same provided today for future use. MiraLAX prep in split fashion also an option.  2. History of colon polyps -- surveillance colonoscopy recommended 2020  25 minutes spent with the patient today. Greater than 50% was spent in counseling and coordination of care with the patient

## 2016-10-03 ENCOUNTER — Other Ambulatory Visit: Payer: Medicare Other

## 2016-10-09 ENCOUNTER — Encounter: Payer: Self-pay | Admitting: Endocrinology

## 2016-10-09 ENCOUNTER — Ambulatory Visit (INDEPENDENT_AMBULATORY_CARE_PROVIDER_SITE_OTHER): Payer: Medicare Other | Admitting: Endocrinology

## 2016-10-09 VITALS — BP 120/70 | HR 52 | Ht 67.0 in | Wt 226.0 lb

## 2016-10-09 DIAGNOSIS — E1165 Type 2 diabetes mellitus with hyperglycemia: Secondary | ICD-10-CM | POA: Diagnosis not present

## 2016-10-09 DIAGNOSIS — E669 Obesity, unspecified: Secondary | ICD-10-CM | POA: Diagnosis not present

## 2016-10-09 LAB — POCT GLYCOSYLATED HEMOGLOBIN (HGB A1C): Hemoglobin A1C: 5.8

## 2016-10-09 NOTE — Progress Notes (Signed)
Patient ID: Noah Barnes, male   DOB: [birth year], 71 y.o.   MRN: [id number]    Reason for Appointment : F/u for Type 2 Diabetes  History of Present Illness          Diagnosis: Type 2 diabetes mellitus, date of diagnosis: 2008       Past history: His diabetes was borderline in the beginning and not clear what medications he was started on. Has been taking metformin for several years and this was later changed to [place]. Amaryl probably started in 5/14 when blood sugars were higher, initially was given 1 mg and now has been taking 2 mg. He had a relatively high A1c of 7.1, difficulty losing weight and a glucose of 202 on his initial consultation in 06/2013 Since then he has been on Invokana in addition to his Janumet and Amaryl Invokana was reduced to 100 mg in early 2015 been creatinine had gone up to 1.6 His Amaryl was stopped in 6/15 because of tendency to occasional hypoglycemia  Recent history:   He has not been seen in follow-up for over a year  However his blood sugars are still very well controlled with A1c 5.7 Because of renal dysfunction previously his Janumet reduced to once a day but is taking this twice a day now   Amaryl 2 mg added at dinnertime in 9/16 Recently has not been motivated to exercise and does not like to walk cold weather also. He has not done readings after meals much at all He thinks his diet is usually fairly good       Oral hypoglycemic drugs the patient is taking are: Amaryl 2 mg at supper, Janumet 50/1000  Side effects from medications have been:  none  Glucose monitoring:  done 1 times a day         Glucometer: One Touch   Blood Glucose readings recently   Mean values apply above for all meters except median for One Touch  PRE-MEAL Fasting Lunch Dinner Bedtime Overall  Glucose range: 79-98    96-1 44     Mean/median:     103    Hypoglycemia: none  Glycemic control:   Lab Results  Component Value Date   HGBA1C 5.8 10/09/2016     HGBA1C 5.7 07/12/2016   HGBA1C 6.0 01/10/2016   Lab Results  Component Value Date   MICROALBUR 5.2 (H) 01/10/2016   LDLCALC 79 07/12/2016   CREATININE 1.38 09/05/2016    Self-care: The diet  improved since he saw the nutritionist and his wife is cooking better, more vegetables in diet  Exercise:  walking 0-2/7 days a week     Dietician consultations: 08/2013              Retinal exam: Most recent: 2013  Weight history:   Wt Readings from Last 3 Encounters:  10/09/16 226 lb (102.5 kg)  09/27/16 226 lb 8 oz (102.7 kg)  09/05/16 224 lb (101.6 kg)       Allergies as of 10/09/2016      Reactions   Ace Inhibitors Other (See Comments)   Severe AKI due to ARB + dehydration Aug 2016   Angiotensin Receptor Blockers Other (See Comments)   Severe AKI due to ARB + dehydration Aug 2016   Losartan Other (See Comments)   Severe AKI due to ARB + dehydration Aug 2016      Medication List       Accurate as of 10/09/16  9:54 PM.  Always use your most recent med list.          allopurinol 300 MG tablet Commonly known as:  ZYLOPRIM TAKE ONE TABLET BY MOUTH ONCE DAILY   aspirin EC 81 MG tablet Take 81 mg by mouth every morning.   atorvastatin 20 MG tablet Commonly known as:  LIPITOR Take 20 mg by mouth daily.   cloNIDine 0.1 MG tablet Commonly known as:  CATAPRES TAKE ONE TABLET BY MOUTH TWICE DAILY   furosemide 20 MG tablet Commonly known as:  LASIX Take 1-2 tablets (20-40 mg total) by mouth daily as needed for fluid or edema.   glimepiride 2 MG tablet Commonly known as:  AMARYL TAKE ONE TABLET BY MOUTH ONCE DAILY BEFORE  BREAKFAST   glucose blood test strip Commonly known as:  ONE TOUCH ULTRA TEST Use to test blood sugar 2 times per day ICD 10 E11.29   hydrALAZINE 25 MG tablet Commonly known as:  APRESOLINE Take 1 tablet (25 mg total) by mouth every 8 (eight) hours.   linaclotide 290 MCG Caps capsule Commonly known as:  LINZESS Take 1 capsule (290 mcg total) by  mouth daily before breakfast.   metoprolol 100 MG tablet Commonly known as:  LOPRESSOR TAKE ONE TABLET BY MOUTH TWICE DAILY   ONETOUCH DELICA LANCETS FINE Misc TEST AS DIRECTED TWICE DAILY   polyethylene glycol powder powder Commonly known as:  GLYCOLAX/MIRALAX Take 255 g by mouth daily.   sitaGLIPtin-metformin 50-1000 MG tablet Commonly known as:  JANUMET Take 1 tablet by mouth 2 (two) times daily with a meal.   tiZANidine 2 MG tablet Commonly known as:  ZANAFLEX Take 1 tablet (2 mg total) by mouth every 6 (six) hours as needed for muscle spasms.   verapamil 240 MG CR tablet Commonly known as:  CALAN-SR TAKE ONE-HALF TABLET BY MOUTH TWICE DAILY   VIAGRA 100 MG tablet Generic drug:  sildenafil TAKE AS DIRECTED       Allergies:  Allergies  Allergen Reactions  . Ace Inhibitors Other (See Comments)    Severe AKI due to ARB + dehydration Aug 2016  . Angiotensin Receptor Blockers Other (See Comments)    Severe AKI due to ARB + dehydration Aug 2016  . Losartan Other (See Comments)    Severe AKI due to ARB + dehydration Aug 2016    Past Medical History:  Diagnosis Date  . Chronic diastolic heart failure ([place])   . Constipation   . Diabetes mellitus   . Diverticulosis   . ED (erectile dysfunction)   . Gout   . Hyperlipidemia   . Hypertension   . IBS (irritable bowel syndrome)   . Renal calculus   . Renal insufficiency   . Tubular adenoma of colon     Past Surgical History:  Procedure Laterality Date  . COLONOSCOPY  1998   negative; no F/U  . knee effusion tapped      Family History  Problem Relation Age of Onset  . Heart attack Mother 79  . Hypertension Mother   . Prostate cancer Father 91  . Diabetes Father   . Irritable bowel syndrome Father   . Colon cancer Brother     dx in his late 8's  . Irritable bowel syndrome Sister     x 2  . Irritable bowel syndrome Brother   . Stroke Neg Hx     Social History:  reports that he quit smoking about 25  years ago. His smoking use included Cigarettes. He  smoked 0.30 packs per day. He has never used smokeless tobacco. He reports that he drinks about 1.2 oz of alcohol per week . He reports that he does not use drugs.    Review of Systems   RENAL dysfunction: He has stable function in the last year  Lab Results  Component Value Date   CREATININE 1.38 09/05/2016   CREATININE 1.32 07/12/2016   CREATININE 1.38 01/10/2016   CREATININE 1.31 11/02/2015           Lipids: Has been on pravastatin for hyperlipidemia, baseline LDL? 162; labs as follows  Lab Results  Component Value Date   CHOL 138 07/12/2016   HDL 43.30 07/12/2016   LDLCALC 79 07/12/2016   LDLDIRECT 80.1 04/15/2014   TRIG 80.0 07/12/2016   CHOLHDL 3 07/12/2016       The blood pressure has been high for several years and usually well controlled.  Currently taking verapamil, hydralazine and metoprolol.    Also on Lasix 20 mg   Blood pressure is good   LABS:  Office Visit on 10/09/2016  Component Date Value Ref Range Status  . Hemoglobin A1C 10/09/2016 5.8   Final    Physical Examination:  BP 120/70   Pulse (!) 52   Ht 5\' 7"  (1.702 m)   Wt 226 lb (102.5 kg)   BMI 35.40 kg/m    Trace  edema present  Diabetic Foot Exam - Simple   Simple Foot Form Diabetic Foot exam was performed with the following findings:  Yes 10/09/2016 11:02 AM  Visual Inspection No deformities, no ulcerations, no other skin breakdown bilaterally:  Yes See comments:  Yes Sensation Testing Intact to touch and monofilament testing bilaterally:  Yes See comments:  Yes Pulse Check Posterior Tibialis and Dorsalis pulse intact bilaterally:  Yes Comments Nearly flat feet      ASSESSMENT/PLAN:   Diabetes type 2, uncontrolled with obesity, BMI 34    His blood sugars are excellent without hypoglycemia and Now taking 2 mg Amaryl and Janumet twice a day Likely benefiting from continuing Januvia Although he has not exercise much  he has  not gained weight Generally watching his diet and home readings are excellent but needs to do readings after meals also  He will start going to the gym for exercise hardly start walking more encouraged him to exercise for long-term benefits   Hypertension: Blood pressure is normal and no recurrence of edema    Patient Instructions  Check blood sugars on waking up 2-3x weekly   Also check blood sugars about 2 hours after a meal and do this after different meals by rotation  Recommended blood sugar levels on waking up is 90-130 and about 2 hours after meal is 130-160  Please bring your blood sugar monitor to each visit, thank you     [facility] 10/09/2016, 9:54 PM

## 2016-10-09 NOTE — Patient Instructions (Signed)
Check blood sugars on waking up 2-3x weekly   Also check blood sugars about 2 hours after a meal and do this after different meals by rotation  Recommended blood sugar levels on waking up is 90-130 and about 2 hours after meal is 130-160  Please bring your blood sugar monitor to each visit, thank you

## 2016-12-05 ENCOUNTER — Other Ambulatory Visit: Payer: Self-pay | Admitting: Internal Medicine

## 2016-12-12 ENCOUNTER — Other Ambulatory Visit: Payer: Self-pay | Admitting: Internal Medicine

## 2016-12-16 NOTE — Progress Notes (Signed)
Subjective:    Patient ID: Noah Barnes, male    DOB: 09-12-1945, 71 y.o.   MRN: [id number]  HPI He is here for a physical exam.   He goes to the gym 3-4 times a week.   Medications and allergies reviewed with patient and updated if appropriate.  Patient Active Problem List   Diagnosis Date Noted  . RUQ pain 09/05/2016  . Abdominal hernia without obstruction and without gangrene 01/10/2016  . CKD stage 2 due to type 2 diabetes mellitus ([place]) 02/25/2015  . Chronic diastolic heart failure ([place]) 02/25/2015  . Right renal mass 02/25/2015  . Other constipation 02/22/2015  . Chronic venous insufficiency 01/18/2015  . Superficial phlebitis 01/18/2015  . AKI (acute kidney injury) ([place]) 11/12/2013  . IBS (irritable bowel syndrome) 11/12/2013  . Family history of prostate cancer 12/26/2011  . Diabetes mellitus with renal complications ([place]) 96/78/9381  . History of gout 12/31/2007  . HYPERLIPIDEMIA 08/14/2007  . ERECTILE DYSFUNCTION 05/09/2007  . NONSPECIFIC ABNORMAL ELECTROCARDIOGRAM 05/09/2007  . Essential hypertension 11/28/2006    Current Outpatient Prescriptions on File Prior to Visit  Medication Sig Dispense Refill  . allopurinol (ZYLOPRIM) 300 MG tablet TAKE ONE TABLET BY MOUTH ONCE DAILY 90 tablet 1  . aspirin EC 81 MG tablet Take 81 mg by mouth every morning.    [name] atorvastatin (LIPITOR) 20 MG tablet Take 20 mg by mouth daily.    . cloNIDine (CATAPRES) 0.1 MG tablet TAKE ONE TABLET BY MOUTH TWICE DAILY 180 tablet 1  . furosemide (LASIX) 20 MG tablet Take 1-2 tablets (20-40 mg total) by mouth daily as needed for fluid or edema. 30 tablet 2  . glimepiride (AMARYL) 2 MG tablet TAKE ONE TABLET BY MOUTH ONCE DAILY BEFORE  BREAKFAST 30 tablet 5  . glucose blood (ONE TOUCH ULTRA TEST) test strip Use to test blood sugar 2 times per day ICD 10 E11.29 100 each 3  . hydrALAZINE (APRESOLINE) 25 MG tablet TAKE ONE TABLET BY MOUTH EVERY 8 HOURS 270 tablet 0  . linaclotide (LINZESS)  290 MCG CAPS capsule Take 1 capsule (290 mcg total) by mouth daily before breakfast. 90 capsule 1  . metoprolol (LOPRESSOR) 100 MG tablet TAKE ONE TABLET BY MOUTH TWICE DAILY 180 tablet 2  . ONETOUCH DELICA LANCETS FINE MISC TEST AS DIRECTED TWICE DAILY 100 each 1  . polyethylene glycol powder (GLYCOLAX/MIRALAX) powder Take 255 g by mouth daily. 255 g 3  . sildenafil (VIAGRA) 100 MG tablet TAKE AS DIRECTED 6 tablet 3  . sitaGLIPtin-metformin (JANUMET) 50-1000 MG tablet Take 1 tablet by mouth 2 (two) times daily with a meal. 60 tablet 5  . tiZANidine (ZANAFLEX) 2 MG tablet Take 1 tablet (2 mg total) by mouth every 6 (six) hours as needed for muscle spasms. 40 tablet 1  . verapamil (CALAN-SR) 240 MG CR tablet TAKE ONE-HALF TABLET BY MOUTH TWICE DAILY 90 tablet 1   No current facility-administered medications on file prior to visit.     Past Medical History:  Diagnosis Date  . Chronic diastolic heart failure ([place])   . Constipation   . Diabetes mellitus   . Diverticulosis   . ED (erectile dysfunction)   . Gout   . Hyperlipidemia   . Hypertension   . IBS (irritable bowel syndrome)   . Renal calculus   . Renal insufficiency   . Tubular adenoma of colon     Past Surgical History:  Procedure Laterality Date  . COLONOSCOPY  1998  negative; no F/U  . knee effusion tapped      Social History   Social History  . Marital status: Married    Spouse name: N/A  . Number of children: 1  . Years of education: N/A   Occupational History  . .................[name]    [phone]   Social History Main Topics  . Smoking status: Former Smoker    Packs/day: 0.30    Types: Cigarettes    Quit date: 07/04/1991  . Smokeless tobacco: Never Used     Comment: smoked age [age], up to 1/3 ppd; may be less  . Alcohol use 1.2 oz/week    2 Glasses of wine per week     Comment: rare  . Drug use: No  . Sexual activity: Not Asked   Other Topics Concern  . None   Social  History Narrative   Exercise: walks 1 mile three times a week    Family History  Problem Relation Age of Onset  . Heart attack Mother 19  . Hypertension Mother   . Prostate cancer Father 38  . Diabetes Father   . Irritable bowel syndrome Father   . Colon cancer Brother        dx in his late 1's  . Irritable bowel syndrome Sister        x 2  . Irritable bowel syndrome Brother   . Stroke Neg Hx     Review of Systems  Constitutional: Negative for appetite change, chills, fatigue, fever and unexpected weight change.  Eyes: Negative for visual disturbance.  Respiratory: Negative for cough, shortness of breath and wheezing.   Cardiovascular: Positive for leg swelling (occasional). Negative for chest pain and palpitations.  Gastrointestinal: Positive for constipation. Negative for abdominal pain, blood in stool, diarrhea and nausea.       Occ gerd  Genitourinary: Negative for difficulty urinating, dysuria and hematuria.  Musculoskeletal: Positive for arthralgias (knees). Negative for back pain and myalgias.  Skin: Negative for color change and rash.  Neurological: Negative for dizziness, light-headedness and headaches.  Psychiatric/Behavioral: Negative for dysphoric mood. The patient is not nervous/anxious.        Objective:   Vitals:   12/18/16 0807  BP: 118/72  Pulse: 61  Resp: 16  Temp: 98.2 F (36.8 C)   Filed Weights   12/18/16 0807  Weight: 226 lb (102.5 kg)   Body mass index is 35.4 kg/m.  Wt Readings from Last 3 Encounters:  12/18/16 226 lb (102.5 kg)  10/09/16 226 lb (102.5 kg)  09/27/16 226 lb 8 oz (102.7 kg)     Physical Exam Constitutional: He appears well-developed and well-nourished. No distress.  HENT:  Head: Normocephalic and atraumatic.  Right Ear: External ear normal.  Left Ear: External ear normal.  Mouth/Throat: Oropharynx is clear and moist.  Normal ear canals and TM b/l  Eyes: Conjunctivae and EOM are normal.  Neck: Neck supple. No  tracheal deviation present. No thyromegaly present.  No carotid bruit  Cardiovascular: Normal rate, regular rhythm, normal heart sounds and intact distal pulses.  No murmur heard.  Trace edema b/l LE Pulmonary/Chest: Effort normal and breath sounds normal. No respiratory distress. He has no wheezes. He has no rales.  Abdominal: Soft. Umbilical hernia - reducible and non-tender. He exhibits no distension. There is no tenderness.  Genitourinary: deferred  Lymphadenopathy:   He has no cervical adenopathy.  Skin: Skin is warm and dry. He is not diaphoretic.  Psychiatric: He has  a normal mood and affect. His behavior is normal.         Assessment & Plan:   Physical exam: Screening blood work ordered Immunizations  Discussed - prevnar - he deferred will get in the fall; discussed tetanus and shingrix Colonoscopy  Up to date  Eye exams   Up to date  EKG   - done by cardio Exercise  regular Weight - advised weight loss - he is working on it Skin   - no concerns Substance abuse   none  See Problem List for Assessment and Plan of chronic medical problems.   FU annually

## 2016-12-18 ENCOUNTER — Ambulatory Visit (INDEPENDENT_AMBULATORY_CARE_PROVIDER_SITE_OTHER): Payer: Medicare Other | Admitting: Internal Medicine

## 2016-12-18 ENCOUNTER — Encounter: Payer: Self-pay | Admitting: Internal Medicine

## 2016-12-18 ENCOUNTER — Other Ambulatory Visit (INDEPENDENT_AMBULATORY_CARE_PROVIDER_SITE_OTHER): Payer: Medicare Other

## 2016-12-18 VITALS — BP 118/72 | HR 61 | Temp 98.2°F | Resp 16 | Ht 67.0 in | Wt 226.0 lb

## 2016-12-18 DIAGNOSIS — I5032 Chronic diastolic (congestive) heart failure: Secondary | ICD-10-CM | POA: Diagnosis not present

## 2016-12-18 DIAGNOSIS — Z8739 Personal history of other diseases of the musculoskeletal system and connective tissue: Secondary | ICD-10-CM

## 2016-12-18 DIAGNOSIS — Z125 Encounter for screening for malignant neoplasm of prostate: Secondary | ICD-10-CM

## 2016-12-18 DIAGNOSIS — E1129 Type 2 diabetes mellitus with other diabetic kidney complication: Secondary | ICD-10-CM

## 2016-12-18 DIAGNOSIS — E1122 Type 2 diabetes mellitus with diabetic chronic kidney disease: Secondary | ICD-10-CM | POA: Diagnosis not present

## 2016-12-18 DIAGNOSIS — N182 Chronic kidney disease, stage 2 (mild): Secondary | ICD-10-CM

## 2016-12-18 DIAGNOSIS — I10 Essential (primary) hypertension: Secondary | ICD-10-CM | POA: Diagnosis not present

## 2016-12-18 DIAGNOSIS — Z0001 Encounter for general adult medical examination with abnormal findings: Secondary | ICD-10-CM | POA: Diagnosis not present

## 2016-12-18 DIAGNOSIS — E782 Mixed hyperlipidemia: Secondary | ICD-10-CM

## 2016-12-18 LAB — CBC WITH DIFFERENTIAL/PLATELET
Basophils Absolute: 0 10*3/uL (ref 0.0–0.1)
Basophils Relative: 1 % (ref 0.0–3.0)
Eosinophils Absolute: 0.1 10*3/uL (ref 0.0–0.7)
Eosinophils Relative: 2.7 % (ref 0.0–5.0)
HCT: 40.5 % (ref 39.0–52.0)
Hemoglobin: 12.9 g/dL — ABNORMAL LOW (ref 13.0–17.0)
Lymphocytes Relative: 30.5 % (ref 12.0–46.0)
Lymphs Abs: 1.4 10*3/uL (ref 0.7–4.0)
MCHC: 31.8 g/dL (ref 30.0–36.0)
MCV: 84 fl (ref 78.0–100.0)
Monocytes Absolute: 0.4 10*3/uL (ref 0.1–1.0)
Monocytes Relative: 9.4 % (ref 3.0–12.0)
Neutro Abs: 2.5 10*3/uL (ref 1.4–7.7)
Neutrophils Relative %: 56.4 % (ref 43.0–77.0)
Platelets: 225 10*3/uL (ref 150.0–400.0)
RBC: 4.82 Mil/uL (ref 4.22–5.81)
RDW: 14.9 % (ref 11.5–15.5)
WBC: 4.4 10*3/uL (ref 4.0–10.5)

## 2016-12-18 LAB — LIPID PANEL
Cholesterol: 124 mg/dL (ref 0–200)
HDL: 42.4 mg/dL (ref >39.00)
LDL Cholesterol: 66 mg/dL (ref 0–99)
NonHDL: 81.81
Total CHOL/HDL Ratio: 3
Triglycerides: 77 mg/dL (ref 0.0–149.0)
VLDL: 15.4 mg/dL (ref 0.0–40.0)

## 2016-12-18 LAB — COMPREHENSIVE METABOLIC PANEL
ALBUMIN: 4.1 g/dL (ref 3.5–5.2)
ALT: 22 U/L (ref 0–53)
AST: 12 U/L (ref 0–37)
Alkaline Phosphatase: 61 U/L (ref 39–117)
BUN: 29 mg/dL — ABNORMAL HIGH (ref 6–23)
CHLORIDE: 113 meq/L — AB (ref 96–112)
CO2: 21 mEq/L (ref 19–32)
Calcium: 9.9 mg/dL (ref 8.4–10.5)
Creatinine, Ser: 1.35 mg/dL (ref 0.40–1.50)
GFR: 67.2 mL/min (ref >60.00)
Glucose, Bld: 131 mg/dL — ABNORMAL HIGH (ref 70–99)
POTASSIUM: 4.8 meq/L (ref 3.5–5.1)
Sodium: 140 mEq/L (ref 135–145)
TOTAL PROTEIN: 6.5 g/dL (ref 6.0–8.3)
Total Bilirubin: 0.6 mg/dL (ref 0.2–1.2)

## 2016-12-18 LAB — TSH: TSH: 1.91 u[IU]/mL (ref 0.35–4.50)

## 2016-12-18 LAB — PSA, MEDICARE: PSA: 0.7 ng/ml (ref 0.10–4.00)

## 2016-12-18 LAB — HEMOGLOBIN A1C: Hgb A1c MFr Bld: 6.4 % (ref 4.6–6.5)

## 2016-12-18 NOTE — Patient Instructions (Signed)
Test(s) ordered today. Your results will be released to [place] (or called to you) after review, usually within 72hours after test completion. If any changes need to be made, you will be notified at that same time.  All other Health Maintenance issues reviewed.   All recommended immunizations and age-appropriate screenings are up-to-date or discussed.  No immunizations administered today.   Medications reviewed and updated.  No changes recommended at this time.   Please followup in one year for a physical    Health Maintenance, Male A healthy lifestyle and preventive care is important for your health and wellness. Ask your health care provider about what schedule of regular examinations is right for you. What should I know about weight and diet? Eat a Healthy Diet  Eat plenty of vegetables, fruits, whole grains, low-fat dairy products, and lean protein.  Do not eat a lot of foods high in solid fats, added sugars, or salt.  Maintain a Healthy Weight Regular exercise can help you achieve or maintain a healthy weight. You should:  Do at least 150 minutes of exercise each week. The exercise should increase your heart rate and make you sweat (moderate-intensity exercise).  Do strength-training exercises at least twice a week.  Watch Your Levels of Cholesterol and Blood Lipids  Have your blood tested for lipids and cholesterol every 5 years starting at 70 years of age. If you are at high risk for heart disease, you should start having your blood tested when you are 70 years old. You may need to have your cholesterol levels checked more often if: ? Your lipid or cholesterol levels are high. ? You are older than 70 years of age. ? You are at high risk for heart disease.  What should I know about cancer screening? Many types of cancers can be detected early and may often be prevented. Lung Cancer  You should be screened every year for lung cancer if: ? You are a current smoker who  has smoked for at least 30 years. ? You are a former smoker who has quit within the past 15 years.  Talk to your health care provider about your screening options, when you should start screening, and how often you should be screened.  Colorectal Cancer  Routine colorectal cancer screening usually begins at 70 years of age and should be repeated every 5-10 years until you are 70 years old. You may need to be screened more often if early forms of precancerous polyps or small growths are found. Your health care provider may recommend screening at an earlier age if you have risk factors for colon cancer.  Your health care provider may recommend using home test kits to check for hidden blood in the stool.  A small camera at the end of a tube can be used to examine your colon (sigmoidoscopy or colonoscopy). This checks for the earliest forms of colorectal cancer.  Prostate and Testicular Cancer  Depending on your age and overall health, your health care provider may do certain tests to screen for prostate and testicular cancer.  Talk to your health care provider about any symptoms or concerns you have about testicular or prostate cancer.  Skin Cancer  Check your skin from head to toe regularly.  Tell your health care provider about any new moles or changes in moles, especially if: ? There is a change in a mole's size, shape, or color. ? You have a mole that is larger than a pencil eraser.  Always use sunscreen.  Apply sunscreen liberally and repeat throughout the day.  Protect yourself by wearing long sleeves, pants, a wide-brimmed hat, and sunglasses when outside.  What should I know about heart disease, diabetes, and high blood pressure?  If you are 85-62 years of age, have your blood pressure checked every 3-5 years. If you are 103 years of age or older, have your blood pressure checked every year. You should have your blood pressure measured twice-once when you are at a hospital or  clinic, and once when you are not at a hospital or clinic. Record the average of the two measurements. To check your blood pressure when you are not at a hospital or clinic, you can use: ? An automated blood pressure machine at a pharmacy. ? A home blood pressure monitor.  Talk to your health care provider about your target blood pressure.  If you are between 59-21 years old, ask your health care provider if you should take aspirin to prevent heart disease.  Have regular diabetes screenings by checking your fasting blood sugar level. ? If you are at a normal weight and have a low risk for diabetes, have this test once every three years after the age of 51. ? If you are overweight and have a high risk for diabetes, consider being tested at a younger age or more often.  A one-time screening for abdominal aortic aneurysm (AAA) by ultrasound is recommended for men aged 20-75 years who are current or former smokers. What should I know about preventing infection? Hepatitis B If you have a higher risk for hepatitis B, you should be screened for this virus. Talk with your health care provider to find out if you are at risk for hepatitis B infection. Hepatitis C Blood testing is recommended for:  Everyone born from [birth year] through 1964.  Anyone with known risk factors for hepatitis C.  Sexually Transmitted Diseases (STDs)  You should be screened each year for STDs including gonorrhea and chlamydia if: ? You are sexually active and are younger than 70 years of age. ? You are older than 70 years of age and your health care provider tells you that you are at risk for this type of infection. ? Your sexual activity has changed since you were last screened and you are at an increased risk for chlamydia or gonorrhea. Ask your health care provider if you are at risk.  Talk with your health care provider about whether you are at high risk of being infected with HIV. Your health care provider may recommend a  prescription medicine to help prevent HIV infection.  What else can I do?  Schedule regular health, dental, and eye exams.  Stay current with your vaccines (immunizations).  Do not use any tobacco products, such as cigarettes, chewing tobacco, and e-cigarettes. If you need help quitting, ask your health care provider.  Limit alcohol intake to no more than 2 drinks per day. One drink equals 12 ounces of beer, 5 ounces of wine, or 1 ounces of hard liquor.  Do not use street drugs.  Do not share needles.  Ask your health care provider for help if you need support or information about quitting drugs.  Tell your health care provider if you often feel depressed.  Tell your health care provider if you have ever been abused or do not feel safe at home. This information is not intended to replace advice given to you by your health care provider. Make sure you discuss any questions you have  with your health care provider. Document Released: 12/16/2007 Document Revised: 02/16/2016 Document Reviewed: 03/23/2015 Elsevier Interactive Patient Education  [employer].

## 2016-12-18 NOTE — Assessment & Plan Note (Signed)
Follows with Dr Dwyane Dee Controlled Check a1c since getting labs done Working on weight loss

## 2016-12-18 NOTE — Assessment & Plan Note (Signed)
BP well controlled Current regimen effective and well tolerated Continue current medications at current doses  

## 2016-12-18 NOTE — Assessment & Plan Note (Signed)
Check lipid panel  Continue daily statin Regular exercise and healthy diet encouraged  

## 2016-12-18 NOTE — Assessment & Plan Note (Signed)
Controlled - no symptoms of gout Continue allopurinol

## 2016-12-18 NOTE — Assessment & Plan Note (Signed)
cmp today 

## 2016-12-18 NOTE — Assessment & Plan Note (Signed)
euvolemic Will f/u with cardio Continue current meds

## 2017-01-22 ENCOUNTER — Other Ambulatory Visit: Payer: Self-pay | Admitting: Internal Medicine

## 2017-01-24 ENCOUNTER — Telehealth: Payer: Self-pay | Admitting: Internal Medicine

## 2017-01-24 MED ORDER — MOVIPREP 100 G PO SOLR: 1.0000 | Freq: Once | ORAL | 0 refills | Status: AC

## 2017-01-24 NOTE — Telephone Encounter (Signed)
Per Dr Vena Rua last office note, patient uses either Miralax prep or Moviprep as a bowel purge every 3-4 months. Rx for Moviprep sent to patient's pharmacy.

## 2017-01-31 ENCOUNTER — Other Ambulatory Visit: Payer: Self-pay | Admitting: Internal Medicine

## 2017-02-02 ENCOUNTER — Other Ambulatory Visit: Payer: Self-pay | Admitting: Endocrinology

## 2017-02-07 ENCOUNTER — Other Ambulatory Visit: Payer: Self-pay | Admitting: Endocrinology

## 2017-02-21 ENCOUNTER — Telehealth: Payer: Self-pay | Admitting: Internal Medicine

## 2017-02-21 NOTE — Telephone Encounter (Signed)
Spoke with pt and she is aware.

## 2017-02-21 NOTE — Telephone Encounter (Signed)
Pt states he is having severe constipation. He reports he used his moviprep purge a couple of weeks ago.He states he feels like he has a lot of stool in his lower abdomen. He reports he only passed a little bit of stool yesterday. Please advise.

## 2017-02-21 NOTE — Telephone Encounter (Signed)
Would advise a MiraLax bowel purge and that he call if no result Once purged, resume previous laxative schedule MiraLax 255 g dissolved in 64 oz of gatorade or the like.  Drink slowly over 2-4 hours until completed

## 2017-02-26 ENCOUNTER — Telehealth: Payer: Self-pay | Admitting: Internal Medicine

## 2017-02-26 ENCOUNTER — Other Ambulatory Visit: Payer: Self-pay

## 2017-02-26 ENCOUNTER — Ambulatory Visit (INDEPENDENT_AMBULATORY_CARE_PROVIDER_SITE_OTHER)
Admission: RE | Admit: 2017-02-26 | Discharge: 2017-02-26 | Disposition: A | Discharge status: Home / Self Care | Payer: Medicare Other | Source: Ambulatory Visit | Attending: Internal Medicine | Admitting: Internal Medicine

## 2017-02-26 DIAGNOSIS — K5900 Constipation, unspecified: Secondary | ICD-10-CM

## 2017-02-26 NOTE — Telephone Encounter (Signed)
Order in epic, pt aware, and sample left up front for pt to pick up.

## 2017-02-26 NOTE — Telephone Encounter (Signed)
Pt states that the miralax/gatorade purge that he did on 02/21/17 did not work well, reports he only passed a small amount and it was watery. Pt states he still feels severely constipated. Pt wants to try something else. Please advise.

## 2017-02-26 NOTE — Telephone Encounter (Signed)
Would have him come for 2 view abd xray and provide a SuPrep sample.  If sample not available then MoviPrep sample.  Would complete this in 1 dose fashion (not split).  If neither sample available would prescribe suprep.

## 2017-03-02 ENCOUNTER — Other Ambulatory Visit: Payer: Self-pay | Admitting: Endocrinology

## 2017-03-29 ENCOUNTER — Other Ambulatory Visit: Payer: Self-pay | Admitting: Internal Medicine

## 2017-04-02 ENCOUNTER — Encounter: Payer: Self-pay | Admitting: Cardiovascular Disease

## 2017-04-02 ENCOUNTER — Ambulatory Visit (INDEPENDENT_AMBULATORY_CARE_PROVIDER_SITE_OTHER): Payer: Medicare Other | Admitting: Cardiovascular Disease

## 2017-04-02 VITALS — BP 120/70 | HR 51 | Ht 68.0 in | Wt 232.4 lb

## 2017-04-02 DIAGNOSIS — I5032 Chronic diastolic (congestive) heart failure: Secondary | ICD-10-CM | POA: Diagnosis not present

## 2017-04-02 DIAGNOSIS — I10 Essential (primary) hypertension: Secondary | ICD-10-CM | POA: Diagnosis not present

## 2017-04-02 NOTE — Patient Instructions (Signed)

## 2017-04-02 NOTE — Progress Notes (Signed)
Cardiology Office Note Date:  04/03/2017   ID:  Noah Barnes, DOB Dec 21, 1945, MRN [id number]  PCP:  Binnie Rail, MD  Cardiologist:  Sherren Mocha, MD    Chief Complaint  Patient presents with  . Follow-up    abnormal nuclear stress test     History of Present Illness: Noah Barnes is a 71 y.o. male who presents for follow-up evaluation. He was last seen 10-29-2015.   He's here alone today. No cardiac-related complaints. Today, he denies symptoms of palpitations, chest pain, shortness of breath, orthopnea, PND, lower extremity edema, dizziness, or syncope. He hasn't been exercising regularly. He is compliant with his medications.   Past Medical History:  Diagnosis Date  . Chronic diastolic heart failure ([place])   . Constipation   . Diabetes mellitus   . Diverticulosis   . ED (erectile dysfunction)   . Gout   . Hyperlipidemia   . Hypertension   . IBS (irritable bowel syndrome)   . Renal calculus   . Renal insufficiency   . Tubular adenoma of colon     Past Surgical History:  Procedure Laterality Date  . COLONOSCOPY  1998   negative; no F/U  . knee effusion tapped      Current Outpatient Prescriptions  Medication Sig Dispense Refill  . allopurinol (ZYLOPRIM) 300 MG tablet TAKE 1 TABLET BY MOUTH ONCE DAILY 90 tablet 1  . aspirin EC 81 MG tablet Take 81 mg by mouth every morning.    [name] atorvastatin (LIPITOR) 20 MG tablet Take 20 mg by mouth daily.    . cloNIDine (CATAPRES) 0.1 MG tablet TAKE ONE TABLET BY MOUTH TWICE DAILY 180 tablet 1  . furosemide (LASIX) 20 MG tablet Take 1-2 tablets (20-40 mg total) by mouth daily as needed for fluid or edema. 30 tablet 2  . glimepiride (AMARYL) 2 MG tablet TAKE ONE TABLET BY MOUTH ONCE DAILY BEFORE BREAKFAST 30 tablet 5  . hydrALAZINE (APRESOLINE) 25 MG tablet TAKE ONE TABLET BY MOUTH EVERY 8 HOURS 270 tablet 0  . JANUMET 50-1000 MG tablet TAKE 1 TABLET BY MOUTH TWICE DAILY WITH A MEAL 60 tablet 5  . linaclotide (LINZESS)  290 MCG CAPS capsule Take 1 capsule (290 mcg total) by mouth daily before breakfast. 90 capsule 1  . metoprolol tartrate (LOPRESSOR) 100 MG tablet TAKE 1 TABLET BY MOUTH TWICE DAILY 180 tablet 1  . ONE TOUCH ULTRA TEST test strip TEST TWICE A DAY 100 each 1  . ONETOUCH DELICA LANCETS FINE MISC TEST AS DIRECTED TWICE DAILY 100 each 1  . polyethylene glycol powder (GLYCOLAX/MIRALAX) powder Take 255 g by mouth daily. 255 g 3  . sildenafil (VIAGRA) 100 MG tablet TAKE AS DIRECTED 6 tablet 3  . verapamil (CALAN-SR) 240 MG CR tablet TAKE ONE-HALF TABLET BY MOUTH TWICE DAILY 90 tablet 1   No current facility-administered medications for this visit.     Allergies:   Ace inhibitors; Angiotensin receptor blockers; and Losartan   Social History:  The patient  reports that he quit smoking about 25 years ago. His smoking use included Cigarettes. He smoked 0.30 packs per day. He has never used smokeless tobacco. He reports that he drinks about 1.2 oz of alcohol per week . He reports that he does not use drugs.   Family History:  The patient's family history includes Colon cancer in his brother; Diabetes in his father; Heart attack (age of onset: 5) in his mother; Hypertension in his mother; Irritable bowel  syndrome in his brother, father, and sister; Prostate cancer (age of onset: 95) in his father.   ROS:  Please see the history of present illness.  Otherwise, review of systems is positive for constipation.  All other systems are reviewed and negative.   PHYSICAL EXAM: VS:  BP 120/70   Pulse (!) 51   Ht 5\' 8"  (1.727 m)   Wt 105.4 kg (232 lb 6.4 oz)   BMI 35.34 kg/m  , BMI Body mass index is 35.34 kg/m. GEN: Well nourished, well developed, in no acute distress  HEENT: normal  Neck: no JVD, no masses. No carotid bruits Cardiac: brady, regular without murmur or gallop             Respiratory:  clear to auscultation bilaterally, normal work of breathing GI: soft, nontender, nondistended, + BS, abdomen  obese MS: no deformity or atrophy  Ext: no pretibial edema, pedal pulses 2+= bilaterally Skin: warm and dry, no rash Neuro:  Strength and sensation are intact Psych: euthymic mood, full affect  EKG:  EKG is ordered today. The ekg ordered today shows sinus bradycardia 51 bpm, voltage criteria for LVH, nonspecific T wave abnormality  Recent Labs: 12/18/2016: ALT 22; BUN 29; Creatinine, Ser 1.35; Hemoglobin 12.9; Platelets 225.0; Potassium 4.8; Sodium 140; TSH 1.91   Lipid Panel     Component Value Date/Time   CHOL 124 12/18/2016 0843   TRIG 77.0 12/18/2016 0843   HDL 42.40 12/18/2016 0843   CHOLHDL 3 12/18/2016 0843   VLDL 15.4 12/18/2016 0843   LDLCALC 66 12/18/2016 0843   LDLDIRECT 80.1 04/15/2014 0953      Wt Readings from Last 3 Encounters:  04/02/17 105.4 kg (232 lb 6.4 oz)  12/18/16 102.5 kg (226 lb)  10/09/16 102.5 kg (226 lb)     Cardiac Studies Reviewed: Echo 03-04-2015: Study Conclusions  - Left ventricle: The cavity size was normal. Systolic function was   normal. The estimated ejection fraction was in the range of 60%   to 65%. There is hypokinesis of the basal-midinferior myocardium.   Doppler parameters are consistent with abnormal left ventricular   relaxation (grade 1 diastolic dysfunction). - Aortic valve: Valve mobility was restricted. - Mitral valve: There was mild regurgitation. - Tricuspid valve: There was mild regurgitation. - Pulmonary arteries: Systolic pressure was mildly increased. PA   peak pressure: 36 mm Hg (S).  Impressions:  - No significant change from prior echocardiogram.  Nuclear Stress Test 11/04/2015: Study Highlights    The left ventricular ejection fraction is mildly decreased (45-54%).  Nuclear stress EF: 46%.  There was no ST segment deviation noted during stress.  Defect 1: There is a medium defect of moderate severity present in the basal inferior, basal inferolateral, mid inferior, apical inferior and apex  location.  This is an intermediate risk study.      ASSESSMENT AND PLAN: 1.  Essential HTN: BP well-controlled. Unable to tolerate ACE/ARB with hx of AKI on these agents.   2. Type 2 DM: followed by Dr Dwyane Dee, treated with oral hypoglycemics. Last HgB A1C is 6.4 mg/dL. We discussed weight loss and it's role in diabetic management.   3. Abnormal EKG/Abnormal nuclear stress test:  Nonreversible defect in the inferior wall noted. Pt has been asymptomatic without angina and medical therapy is recommended. LVEF is normal by echo assessment (see above).  He has never undergone cath evaluation and there is currently no indication for further testing.   4. Hyperlipidemia: treated with a statin  drug. Last LDL cholesterol is 66 mg/dL.   Current medicines are reviewed with the patient today.  The patient does not have concerns regarding medicines.  Labs/ tests ordered today include:   Orders Placed This Encounter  Procedures  . EKG 12-Lead   Disposition:   FU one year  Signed, Sherren Mocha, MD  04/03/2017 [facility] [place], [place], [place]  [address] Phone: [phone]; Fax: [phone]

## 2017-04-10 ENCOUNTER — Encounter: Payer: Self-pay | Admitting: Endocrinology

## 2017-04-10 ENCOUNTER — Ambulatory Visit (INDEPENDENT_AMBULATORY_CARE_PROVIDER_SITE_OTHER): Payer: Medicare Other | Admitting: Endocrinology

## 2017-04-10 VITALS — BP 128/78 | HR 50 | Ht 67.25 in | Wt 228.8 lb

## 2017-04-10 DIAGNOSIS — E1165 Type 2 diabetes mellitus with hyperglycemia: Secondary | ICD-10-CM | POA: Diagnosis not present

## 2017-04-10 DIAGNOSIS — Z23 Encounter for immunization: Secondary | ICD-10-CM

## 2017-04-10 LAB — POCT GLYCOSYLATED HEMOGLOBIN (HGB A1C): Hemoglobin A1C: 6

## 2017-04-10 NOTE — Progress Notes (Signed)
Patient ID: Noah Barnes, male   DOB: [birth year], 71 y.o.   MRN: [id number]    Reason for Appointment : F/u for Type 2 Diabetes  History of Present Illness          Diagnosis: Type 2 diabetes mellitus, date of diagnosis: 2008       Past history: His diabetes was borderline in the beginning and not clear what medications he was started on. Has been taking metformin for several years and this was later changed to [place]. Amaryl probably started in 5/14 when blood sugars were higher, initially was given 1 mg and now has been taking 2 mg. He had a relatively high A1c of 7.1, difficulty losing weight and a glucose of 202 on his initial consultation in 06/2013 Since then he has been on Invokana in addition to his Janumet and Amaryl Invokana was reduced to 100 mg in early 2015 been creatinine had gone up to 1.6  His Amaryl was stopped in 6/15 because of tendency to occasional hypoglycemia  Recent history:   Oral hypoglycemic drugs the patient is taking are: Amaryl 2 mg at supper, Janumet 50/1000 twice daily   His A1c is now 6.4, previously 5.8  Current management, blood sugar patterns and problems identified:  He has not been seen for several months in the office  He only checks sporadic blood sugars at home in the morning and none after meals as directed  Despite reminders he does not exercise or go for walks  Again has difficulty losing weight  Side effects from medications have been:  none  Glucose monitoring:  done 1 times a day         Glucometer: One Touch   Blood Glucose readings recently from download  FASTING readings range from 108-156 Only one reading in the afternoon of 72 Overall MEDIAN 125  Hypoglycemia: none  Glycemic control:   Lab Results  Component Value Date   HGBA1C 6.0 04/10/2017   HGBA1C 6.4 12/18/2016   HGBA1C 5.8 10/09/2016   Lab Results  Component Value Date   MICROALBUR 5.2 (H) 01/10/2016   LDLCALC 66 12/18/2016   CREATININE 1.35  12/18/2016    Self-care: The diet  Is generally low in fat  Exercise: not walking      Dietician consultations: 08/2013              Retinal exam: Most recent: 2013  Weight history:   Wt Readings from Last 3 Encounters:  04/10/17 228 lb 12.8 oz (103.8 kg)  04/02/17 232 lb 6.4 oz (105.4 kg)  12/18/16 226 lb (102.5 kg)       Allergies as of 04/10/2017      Reactions   Ace Inhibitors Other (See Comments)   Severe AKI due to ARB + dehydration Aug 2016   Angiotensin Receptor Blockers Other (See Comments)   Severe AKI due to ARB + dehydration Aug 2016   Losartan Other (See Comments)   Severe AKI due to ARB + dehydration Aug 2016      Medication List       Accurate as of 04/10/17  1:00 PM. Always use your most recent med list.          allopurinol 300 MG tablet Commonly known as:  ZYLOPRIM TAKE 1 TABLET BY MOUTH ONCE DAILY   aspirin EC 81 MG tablet Take 81 mg by mouth every morning.   atorvastatin 20 MG tablet Commonly known as:  LIPITOR Take 20 mg by mouth  daily.   cloNIDine 0.1 MG tablet Commonly known as:  CATAPRES TAKE ONE TABLET BY MOUTH TWICE DAILY   furosemide 20 MG tablet Commonly known as:  LASIX Take 1-2 tablets (20-40 mg total) by mouth daily as needed for fluid or edema.   glimepiride 2 MG tablet Commonly known as:  AMARYL TAKE ONE TABLET BY MOUTH ONCE DAILY BEFORE BREAKFAST   hydrALAZINE 25 MG tablet Commonly known as:  APRESOLINE TAKE ONE TABLET BY MOUTH EVERY 8 HOURS   JANUMET 50-1000 MG tablet Generic drug:  sitaGLIPtin-metformin TAKE 1 TABLET BY MOUTH TWICE DAILY WITH A MEAL   linaclotide 290 MCG Caps capsule Commonly known as:  LINZESS Take 1 capsule (290 mcg total) by mouth daily before breakfast.   metoprolol tartrate 100 MG tablet Commonly known as:  LOPRESSOR TAKE 1 TABLET BY MOUTH TWICE DAILY   ONE TOUCH ULTRA TEST test strip Generic drug:  glucose blood TEST TWICE A DAY   ONETOUCH DELICA LANCETS FINE Misc TEST AS  DIRECTED TWICE DAILY   polyethylene glycol powder powder Commonly known as:  GLYCOLAX/MIRALAX Take 255 g by mouth daily.   sildenafil 100 MG tablet Commonly known as:  VIAGRA TAKE AS DIRECTED   verapamil 240 MG CR tablet Commonly known as:  CALAN-SR TAKE ONE-HALF TABLET BY MOUTH TWICE DAILY       Allergies:  Allergies  Allergen Reactions  . Ace Inhibitors Other (See Comments)    Severe AKI due to ARB + dehydration Aug 2016  . Angiotensin Receptor Blockers Other (See Comments)    Severe AKI due to ARB + dehydration Aug 2016  . Losartan Other (See Comments)    Severe AKI due to ARB + dehydration Aug 2016    Past Medical History:  Diagnosis Date  . Chronic diastolic heart failure ([place])   . Constipation   . Diabetes mellitus   . Diverticulosis   . ED (erectile dysfunction)   . Gout   . Hyperlipidemia   . Hypertension   . IBS (irritable bowel syndrome)   . Renal calculus   . Renal insufficiency   . Tubular adenoma of colon     Past Surgical History:  Procedure Laterality Date  . COLONOSCOPY  1998   negative; no F/U  . knee effusion tapped      Family History  Problem Relation Age of Onset  . Heart attack Mother 36  . Hypertension Mother   . Prostate cancer Father 7  . Diabetes Father   . Irritable bowel syndrome Father   . Colon cancer Brother        dx in his late 60's  . Irritable bowel syndrome Sister        x 2  . Irritable bowel syndrome Brother   . Stroke Neg Hx     Social History:  reports that he quit smoking about 25 years ago. His smoking use included Cigarettes. He smoked 0.30 packs per day. He has never used smokeless tobacco. He reports that he drinks about 1.2 oz of alcohol per week . He reports that he does not use drugs.    Review of Systems   RENAL dysfunction: He has stable function > 1 year  Lab Results  Component Value Date   CREATININE 1.35 12/18/2016   CREATININE 1.38 09/05/2016   CREATININE 1.32 07/12/2016   CREATININE  1.38 01/10/2016           Lipids: Has been on pravastatin for hyperlipidemia, baseline LDL? 162; labs as follows  Lab Results  Component Value Date   CHOL 124 12/18/2016   HDL 42.40 12/18/2016   LDLCALC 66 12/18/2016   LDLDIRECT 80.1 04/15/2014   TRIG 77.0 12/18/2016   CHOLHDL 3 12/18/2016       The blood pressure has been high for several years and usually well controlled.  Currently taking verapamil, hydralazine and metoprolol.    Also on Lasix 20 mg, Followed by cardiologist for history of diastolic dysfunction     LABS:  Office Visit on 04/10/2017  Component Date Value Ref Range Status  . Hemoglobin A1C 04/10/2017 6.0   Final    Physical Examination:  BP 128/78   Pulse (!) 50   Ht 5' 7.25" (1.708 m)   Wt 228 lb 12.8 oz (103.8 kg)   SpO2 97%   BMI 35.57 kg/m   Blood pressure was checked standing up also   He has significant abdominal obesity 1+ ankle edema present  ASSESSMENT/PLAN:   Diabetes type 2, uncontrolled with obesity, BMI 34    His blood sugars are fairly good although A1c is higher than usual at its 0.4 He is only doing readings in the mornings and none after meals Fasting readings are near normal  Today discussed the need for regular exercise and weight loss and he thinks he can start doing this now Also reminded him about checking readings after meals to help him continue to improve his diet This is a good candidate for adding [place] or Invokana with his abdominal obesity, difficulty with losing weight and congestive heart failure Discussed that these medications do  reduce cardiovascular events and mortality and will need to check with his cardiologist if he can start [place] or Invokana which ever is covered Most likely will leave off his low-dose Lasix if he does that   HYPERTENSION: Blood pressure is fairly good although slightly higher the first time, mild decrease on standing up    Patient Instructions  Check blood sugars on  waking up  2-3/7 days  Also check blood sugars about 2 hours after a meal and do this after different meals by rotation  Recommended blood sugar levels on waking up is 90-130 and about 2 hours after meal is 130-160  Please bring your blood sugar monitor to each visit, thank you     Graig Hessling 04/10/2017, 1:00 PM

## 2017-04-10 NOTE — Patient Instructions (Signed)
Check blood sugars on waking up  2-3/7 days  Also check blood sugars about 2 hours after a meal and do this after different meals by rotation  Recommended blood sugar levels on waking up is 90-130 and about 2 hours after meal is 130-160  Please bring your blood sugar monitor to each visit, thank you

## 2017-04-11 ENCOUNTER — Other Ambulatory Visit: Payer: Self-pay | Admitting: Endocrinology

## 2017-04-11 ENCOUNTER — Telehealth: Payer: Self-pay | Admitting: Endocrinology

## 2017-04-11 MED ORDER — CANAGLIFLOZIN 100 MG PO TABS: ORAL_TABLET | ORAL | 1 refills | Status: DC

## 2017-04-11 NOTE — Telephone Encounter (Signed)
Will need to start [place] as discussed with him; approved by Dr Burt Knack. With this he will stop Lasix and amaryl and see me me in 1 month with labs

## 2017-04-12 LAB — MICROALBUMIN / CREATININE URINE RATIO
Creatinine,U: 208 mg/dL
Microalb Creat Ratio: 0.9 mg/g (ref 0.0–30.0)
Microalb, Ur: 1.8 mg/dL (ref 0.0–1.9)

## 2017-04-12 LAB — COMPREHENSIVE METABOLIC PANEL
ALT: 24 U/L (ref 0–53)
AST: 15 U/L (ref 0–37)
Albumin: 4.1 g/dL (ref 3.5–5.2)
Alkaline Phosphatase: 60 U/L (ref 39–117)
BUN: 25 mg/dL — ABNORMAL HIGH (ref 6–23)
CHLORIDE: 110 meq/L (ref 96–112)
CO2: 21 mEq/L (ref 19–32)
CREATININE: 1.35 mg/dL (ref 0.40–1.50)
Calcium: 9 mg/dL (ref 8.4–10.5)
GFR: 67.14 mL/min (ref >60.00)
Glucose, Bld: 110 mg/dL — ABNORMAL HIGH (ref 70–99)
Potassium: 4.5 mEq/L (ref 3.5–5.1)
Sodium: 138 mEq/L (ref 135–145)
Total Bilirubin: 0.5 mg/dL (ref 0.2–1.2)
Total Protein: 7 g/dL (ref 6.0–8.3)

## 2017-04-12 NOTE — Addendum Note (Signed)
Addended by: Kaylyn Lim I on: 04/12/2017 10:30 AM   Modules accepted: Orders

## 2017-04-12 NOTE — Telephone Encounter (Signed)
Called patient and let him know Dr. Jodelle Green message and he stated that he will pick up the Invokana and stop the Amaryl and the Lasix and if he has any concerns he will call our office back.  I have also made him an appointment on 05/22/2017 at 10:15am to see Dr. Dwyane Dee and a lab appointment on 05/17/2017 at 10:15am.

## 2017-04-12 NOTE — Addendum Note (Signed)
Addended by: Kaylyn Lim I on: 04/12/2017 09:52 AM   Modules accepted: Orders

## 2017-04-12 NOTE — Telephone Encounter (Signed)
Invokana will replace Amaryl and will help him with weight loss and reduce chances of more heart problems.  Again reminded that he will not take Lasix unless having swelling when he takes Invokana.  We will need to have him come back in a month for follow-up with labs

## 2017-04-12 NOTE — Telephone Encounter (Signed)
Called patient and let him know the message. He is a little confused because he was under the impression that he was going to continue what he was taking since his a1c was 6.0 and he would be reevaluated in January.   Is the Invokana taking place of the Amaryl? He also stated that he knew you were going to contact Dr. Burt Knack about the Lasix.  Please advise.

## 2017-04-17 ENCOUNTER — Other Ambulatory Visit: Payer: Self-pay | Admitting: Internal Medicine

## 2017-04-30 ENCOUNTER — Other Ambulatory Visit: Payer: Self-pay | Admitting: Internal Medicine

## 2017-05-15 ENCOUNTER — Other Ambulatory Visit: Payer: Self-pay | Admitting: Endocrinology

## 2017-05-15 DIAGNOSIS — E1165 Type 2 diabetes mellitus with hyperglycemia: Secondary | ICD-10-CM

## 2017-05-17 ENCOUNTER — Other Ambulatory Visit (INDEPENDENT_AMBULATORY_CARE_PROVIDER_SITE_OTHER): Payer: Medicare Other

## 2017-05-17 DIAGNOSIS — E1165 Type 2 diabetes mellitus with hyperglycemia: Secondary | ICD-10-CM

## 2017-05-17 LAB — BASIC METABOLIC PANEL
BUN: 38 mg/dL — ABNORMAL HIGH (ref 6–23)
CO2: 15 meq/L — AB (ref 19–32)
Calcium: 10.3 mg/dL (ref 8.4–10.5)
Chloride: 115 mEq/L — ABNORMAL HIGH (ref 96–112)
Creatinine, Ser: 1.88 mg/dL — ABNORMAL HIGH (ref 0.40–1.50)
GFR: 45.8 mL/min — AB (ref >60.00)
GLUCOSE: 127 mg/dL — AB (ref 70–99)
Potassium: 5.1 mEq/L (ref 3.5–5.1)
Sodium: 138 mEq/L (ref 135–145)

## 2017-05-18 LAB — FRUCTOSAMINE: Fructosamine: 244 umol/L (ref 0–285)

## 2017-05-22 ENCOUNTER — Ambulatory Visit: Payer: Medicare Other | Admitting: Endocrinology

## 2017-05-22 ENCOUNTER — Encounter: Payer: Self-pay | Admitting: Endocrinology

## 2017-05-22 VITALS — BP 110/70 | HR 57 | Ht 67.25 in | Wt 221.6 lb

## 2017-05-22 DIAGNOSIS — E119 Type 2 diabetes mellitus without complications: Secondary | ICD-10-CM | POA: Diagnosis not present

## 2017-05-22 DIAGNOSIS — N289 Disorder of kidney and ureter, unspecified: Secondary | ICD-10-CM

## 2017-05-22 MED ORDER — GLIMEPIRIDE 2 MG PO TABS: ORAL_TABLET | ORAL | 5 refills | Status: DC

## 2017-05-22 NOTE — Patient Instructions (Signed)
Verapramil once daily in pm only

## 2017-05-22 NOTE — Progress Notes (Signed)
Patient ID: Noah Barnes, male   DOB: 10-31-1945, 71 y.o.   MRN: [id number]    Reason for Appointment : F/u for Type 2 Diabetes  History of Present Illness          Diagnosis: Type 2 diabetes mellitus, date of diagnosis: 2008       Past history: His diabetes was borderline in the beginning and not clear what medications he was started on. Has been taking metformin for several years and this was later changed to [place]. Amaryl probably started in 5/14 when blood sugars were higher, initially was given 1 mg and now has been taking 2 mg. He had a relatively high A1c of 7.1, difficulty losing weight and a glucose of 202 on his initial consultation in 70/2014 Since then he has been on Invokana in addition to his Janumet and Amaryl Invokana was reduced to 100 mg in early 2015 been creatinine had gone up to 1.6  His Amaryl was stopped in 6/15 because of tendency to occasional hypoglycemia  Recent history:   Oral hypoglycemic drugs the patient is taking are: Amaryl 2 mg at supper, Janumet 50/1000 twice daily   His A1c is last 6.0, Previous range 5.8-6.4 Fructosamine is 244  Current management, blood sugar patterns and problems identified:  Because of his progressive weight gain and cardiovascular risk reduction he was tried on Invokana in early October replacing Amaryl  However he started seeing his fasting blood sugars going up to as much as 169, previously otherwise near normal  He stopped his Invokana about 2 weeks ago and went back to Amaryl on his own with much improved blood sugars recently with the highest being lonely 139 last Friday  He has a few readings at bedtime which are usually excellent even when taking Invokana  His weight has gone down 7 pounds  He says he is trying to be a little more active also  However his creatinine has gone up from about 1.4 to nearly 1.9  Side effects from medications have been:  renal dysfunction from Invokana  Glucose  monitoring:  done 1 times a day         Glucometer: One Touch   Blood Glucose readings recently from download  FASTING readings range from 89-1 69 Evening range 88-1 29  Overall MEDIAN 129  Hypoglycemia: none  Glycemic control:   Lab Results  Component Value Date   HGBA1C 6.0 04/10/2017   HGBA1C 6.4 12/18/2016   HGBA1C 5.8 10/09/2016   Lab Results  Component Value Date   MICROALBUR 1.8 04/12/2017   LDLCALC 66 12/18/2016   CREATININE 1.88 (H) 05/17/2017    Self-care: Usually has low-fat diet  Exercise: walking  a little more recently    Dietician consultations: 08/2013              Retinal exam: Most recent: 2013  Weight history:   Wt Readings from Last 3 Encounters:  05/22/17 221 lb 9.6 oz (100.5 kg)  04/10/17 228 lb 12.8 oz (103.8 kg)  04/02/17 232 lb 6.4 oz (105.4 kg)       Allergies as of 05/22/2017      Reactions   Ace Inhibitors Other (See Comments)   Severe AKI due to ARB + dehydration Aug 2016   Angiotensin Receptor Blockers Other (See Comments)   Severe AKI due to ARB + dehydration Aug 2016   Losartan Other (See Comments)   Severe AKI due to ARB + dehydration Aug 2016  Medication List        Accurate as of 05/22/17 11:19 AM. Always use your most recent med list.          allopurinol 300 MG tablet Commonly known as:  ZYLOPRIM TAKE 1 TABLET BY MOUTH ONCE DAILY   aspirin EC 81 MG tablet Take 81 mg by mouth every morning.   atorvastatin 20 MG tablet Commonly known as:  LIPITOR Take 20 mg by mouth daily.   canagliflozin 100 MG Tabs tablet Commonly known as:  INVOKANA 1 tablet before breakfast   cloNIDine 0.1 MG tablet Commonly known as:  CATAPRES TAKE 1 TABLET BY MOUTH TWICE DAILY   furosemide 20 MG tablet Commonly known as:  LASIX Take 1-2 tablets (20-40 mg total) by mouth daily as needed for fluid or edema.   glimepiride 2 MG tablet Commonly known as:  AMARYL TAKE ONE TABLET BY MOUTH ONCE DAILY BEFORE BREAKFAST     hydrALAZINE 25 MG tablet Commonly known as:  APRESOLINE TAKE 1 TABLET BY MOUTH EVERY 8 HOURS   JANUMET 50-1000 MG tablet Generic drug:  sitaGLIPtin-metformin TAKE 1 TABLET BY MOUTH TWICE DAILY WITH A MEAL   linaclotide 290 MCG Caps capsule Commonly known as:  LINZESS Take 1 capsule (290 mcg total) by mouth daily before breakfast.   metoprolol tartrate 100 MG tablet Commonly known as:  LOPRESSOR TAKE 1 TABLET BY MOUTH TWICE DAILY   ONE TOUCH ULTRA TEST test strip Generic drug:  glucose blood TEST TWICE A DAY   ONETOUCH DELICA LANCETS FINE Misc TEST AS DIRECTED TWICE DAILY   polyethylene glycol powder powder Commonly known as:  GLYCOLAX/MIRALAX Take 255 g by mouth daily.   sildenafil 100 MG tablet Commonly known as:  VIAGRA TAKE AS DIRECTED   verapamil 240 MG CR tablet Commonly known as:  CALAN-SR TAKE ONE-HALF TABLET BY MOUTH TWICE DAILY       Allergies:  Allergies  Allergen Reactions  . Ace Inhibitors Other (See Comments)    Severe AKI due to ARB + dehydration Aug 2016  . Angiotensin Receptor Blockers Other (See Comments)    Severe AKI due to ARB + dehydration Aug 2016  . Losartan Other (See Comments)    Severe AKI due to ARB + dehydration Aug 2016    Past Medical History:  Diagnosis Date  . Chronic diastolic heart failure ([place])   . Constipation   . Diabetes mellitus   . Diverticulosis   . ED (erectile dysfunction)   . Gout   . Hyperlipidemia   . Hypertension   . IBS (irritable bowel syndrome)   . Renal calculus   . Renal insufficiency   . Tubular adenoma of colon     Past Surgical History:  Procedure Laterality Date  . COLONOSCOPY  1998   negative; no F/U  . knee effusion tapped      Family History  Problem Relation Age of Onset  . Heart attack Mother 83  . Hypertension Mother   . Prostate cancer Father 81  . Diabetes Father   . Irritable bowel syndrome Father   . Colon cancer Brother        dx in his late 69's  . Irritable bowel  syndrome Sister        x 2  . Irritable bowel syndrome Brother   . Stroke Neg Hx     Social History:  reports that he quit smoking about 25 years ago. His smoking use included cigarettes. He smoked 0.30 packs per day.  he has never used smokeless tobacco. He reports that he drinks about 1.2 oz of alcohol per week. He reports that he does not use drugs.    Review of Systems   RENAL dysfunction: He has stable function previously with creatinine about 1.38 but now higher  He has not taken Lasix along with the Invokana as suggested and recently also has taken it only occasionally when he has edema  Lab Results  Component Value Date   CREATININE 1.88 (H) 05/17/2017   CREATININE 1.35 04/12/2017   CREATININE 1.35 12/18/2016   CREATININE 1.38 09/05/2016           Lipids: Has been on pravastatin for hyperlipidemia, baseline LDL? 162; labs as follows  Lab Results  Component Value Date   CHOL 124 12/18/2016   HDL 42.40 12/18/2016   LDLCALC 66 12/18/2016   LDLDIRECT 80.1 04/15/2014   TRIG 77.0 12/18/2016   CHOLHDL 3 12/18/2016       The blood pressure has been high for several years and usually well controlled.  Currently taking verapamil, hydralazine, clonidine and metoprolol.  Has not checked blood pressure at home recently   Also on Lasix 20 mg, Followed by cardiologist for history of diastolic dysfunction     LABS:  Lab on 05/17/2017  Component Date Value Ref Range Status  . Fructosamine 05/17/2017 244  0 - 285 umol/L Final   Comment: Published reference interval for apparently healthy subjects between age 21 and 66 is 92 - 285 umol/L and in a poorly controlled diabetic population is 228 - 563 umol/L with a mean of 396 umol/L.   [name] Sodium 05/17/2017 138  135 - 145 mEq/L Final  . Potassium 05/17/2017 5.1  3.5 - 5.1 mEq/L Final  . Chloride 05/17/2017 115* 96 - 112 mEq/L Final  . CO2 05/17/2017 15* 19 - 32 mEq/L Final  . Glucose, Bld 05/17/2017 127* 70 - 99 mg/dL Final    . BUN 05/17/2017 38* 6 - 23 mg/dL Final  . Creatinine, Ser 05/17/2017 1.88* 0.40 - 1.50 mg/dL Final  . Calcium 05/17/2017 10.3  8.4 - 10.5 mg/dL Final  . GFR 05/17/2017 45.80* >60.00 mL/min Final    Physical Examination:  BP 110/70   Pulse (!) 57   Ht 5' 7.25" (1.708 m)   Wt 221 lb 9.6 oz (100.5 kg)   SpO2 97%   BMI 34.45 kg/m    Blood pressure was checked standing up also    ASSESSMENT/PLAN:   Diabetes type 2, uncontrolled with obesity, BMI 34    Last A1c 6% His blood sugars are fairly good although appeared to be higher when he was taking Invokana instead of Amaryl With this change he did lose weight which he had been not able to before Also trying to do a little more walking Recent blood sugars are fairly normal and he can continue Amaryl  RENAL dysfunction: This is likely to be from [facility] although he has not taken this for 2 weeks, blood pressure is lower than before  HYPERTENSION: Blood pressure is low normal Because of his renal dysfunction will reduce his verapamil down to one half daily only Reminded him to check blood pressure readings at home regularly Will have him follow-up in 6 weeks with BMP Take Lasix as needed for now    Patient Instructions  Verapramil once daily in pm only    Lourdes Kucharski 05/22/2017, 11:19 AM

## 2017-05-23 ENCOUNTER — Other Ambulatory Visit: Payer: Self-pay | Admitting: Internal Medicine

## 2017-06-18 ENCOUNTER — Telehealth: Payer: Self-pay | Admitting: Internal Medicine

## 2017-06-18 MED ORDER — NA SULFATE-K SULFATE-MG SULF 17.5-3.13-1.6 GM/177ML PO SOLN: 1.0000 | Freq: Once | ORAL | 1 refills | Status: AC

## 2017-06-18 NOTE — Telephone Encounter (Signed)
Pt states he has not had a BM in several days. Reports that sometimes the linzess stops working as well as it normally does and he has to do a purge with a colon prep. After the prep he states the linzess starts working again. Pt is requesting a colon prep be called to his pharmacy. Please advise.

## 2017-06-18 NOTE — Telephone Encounter (Signed)
[facility] for [employer] or SuPrep with 1 refill JMP

## 2017-06-18 NOTE — Telephone Encounter (Signed)
Pt aware and script sent to pharmacy. 

## 2017-06-29 ENCOUNTER — Other Ambulatory Visit: Payer: Self-pay | Admitting: Internal Medicine

## 2017-07-02 ENCOUNTER — Other Ambulatory Visit: Payer: Medicare Other

## 2017-07-05 ENCOUNTER — Ambulatory Visit: Payer: Medicare Other | Admitting: Endocrinology

## 2017-07-11 ENCOUNTER — Other Ambulatory Visit (INDEPENDENT_AMBULATORY_CARE_PROVIDER_SITE_OTHER): Payer: Medicare Other

## 2017-07-11 DIAGNOSIS — E119 Type 2 diabetes mellitus without complications: Secondary | ICD-10-CM

## 2017-07-11 LAB — BASIC METABOLIC PANEL
BUN: 35 mg/dL — ABNORMAL HIGH (ref 6–23)
CO2: 20 mEq/L (ref 19–32)
Calcium: 9.3 mg/dL (ref 8.4–10.5)
Chloride: 111 mEq/L (ref 96–112)
Creatinine, Ser: 1.68 mg/dL — ABNORMAL HIGH (ref 0.40–1.50)
GFR: 52.13 mL/min — ABNORMAL LOW (ref >60.00)
Glucose, Bld: 96 mg/dL (ref 70–99)
Potassium: 4.9 mEq/L (ref 3.5–5.1)
Sodium: 138 mEq/L (ref 135–145)

## 2017-07-15 NOTE — Progress Notes (Deleted)
Patient ID: Noah Barnes, male   DOB: Dec 04, 1945, 72 y.o.   MRN: [id number]    Reason for Appointment : F/u for Type 2 Diabetes  History of Present Illness          Diagnosis: Type 2 diabetes mellitus, date of diagnosis: 2008       Past history: His diabetes was borderline in the beginning and not clear what medications he was started on. Has been taking metformin for several years and this was later changed to [place]. Amaryl probably started in 5/14 when blood sugars were higher, initially was given 1 mg and now has been taking 2 mg. He had a relatively high A1c of 7.1, difficulty losing weight and a glucose of 202 on his initial consultation in 06/2013 Since then he has been on Invokana in addition to his Janumet and Amaryl Invokana was reduced to 100 mg in early 2015 been creatinine had gone up to 1.6  His Amaryl was stopped in 6/15 because of tendency to occasional hypoglycemia  Recent history:   Oral hypoglycemic drugs the patient is taking are: Amaryl 2 mg at supper, Janumet 50/1000 twice daily   His A1c is last 6.0, Previous range 5.8-6.4 Fructosamine is 244  Current management, blood sugar patterns and problems identified:  Because of his progressive weight gain and cardiovascular risk reduction he was tried on Invokana in early October replacing Amaryl  However he started seeing his fasting blood sugars going up to as much as 169, previously otherwise near normal  He stopped his Invokana about 2 weeks ago and went back to Amaryl on his own with much improved blood sugars recently with the highest being lonely 139 last Friday  He has a few readings at bedtime which are usually excellent even when taking Invokana  His weight has gone down 7 pounds  He says he is trying to be a little more active also  However his creatinine has gone up from about 1.4 to nearly 1.9  Side effects from medications have been:  renal dysfunction from Invokana  Glucose  monitoring:  done 1 times a day         Glucometer: One Touch   Blood Glucose readings recently from download  FASTING readings range from 89-1 69 Evening range 88-1 29  Overall MEDIAN 129  Hypoglycemia: none  Glycemic control:   Lab Results  Component Value Date   HGBA1C 6.0 04/10/2017   HGBA1C 6.4 12/18/2016   HGBA1C 5.8 10/09/2016   Lab Results  Component Value Date   MICROALBUR 1.8 04/12/2017   LDLCALC 66 12/18/2016   CREATININE 1.68 (H) 07/11/2017    Self-care: Usually has low-fat diet  Exercise: walking  a little more recently    Dietician consultations: 08/2013              Retinal exam: Most recent: 2013  Weight history:   Wt Readings from Last 3 Encounters:  05/22/17 221 lb 9.6 oz (100.5 kg)  04/10/17 228 lb 12.8 oz (103.8 kg)  04/02/17 232 lb 6.4 oz (105.4 kg)       Allergies as of 07/16/2017      Reactions   Ace Inhibitors Other (See Comments)   Severe AKI due to ARB + dehydration Aug 2016   Angiotensin Receptor Blockers Other (See Comments)   Severe AKI due to ARB + dehydration Aug 2016   Losartan Other (See Comments)   Severe AKI due to ARB + dehydration Aug 2016  Medication List        Accurate as of 07/15/17  9:05 PM. Always use your most recent med list.          allopurinol 300 MG tablet Commonly known as:  ZYLOPRIM TAKE 1 TABLET BY MOUTH ONCE DAILY   aspirin EC 81 MG tablet Take 81 mg by mouth every morning.   atorvastatin 20 MG tablet Commonly known as:  LIPITOR Take 20 mg by mouth daily.   cloNIDine 0.1 MG tablet Commonly known as:  CATAPRES TAKE 1 TABLET BY MOUTH TWICE DAILY   furosemide 20 MG tablet Commonly known as:  LASIX Take 1-2 tablets (20-40 mg total) by mouth daily as needed for fluid or edema.   glimepiride 2 MG tablet Commonly known as:  AMARYL TAKE ONE TABLET BY MOUTH ONCE DAILY BEFORE BREAKFAST   hydrALAZINE 25 MG tablet Commonly known as:  APRESOLINE TAKE 1 TABLET BY MOUTH EVERY 8 HOURS     JANUMET 50-1000 MG tablet Generic drug:  sitaGLIPtin-metformin TAKE 1 TABLET BY MOUTH TWICE DAILY WITH A MEAL   linaclotide 290 MCG Caps capsule Commonly known as:  LINZESS Take 1 capsule (290 mcg total) by mouth daily before breakfast.   LINZESS 290 MCG Caps capsule Generic drug:  linaclotide TAKE ONE CAPSULE BY MOUTH ONCE DAILY BEFORE BREAKFAST   metoprolol tartrate 100 MG tablet Commonly known as:  LOPRESSOR TAKE 1 TABLET BY MOUTH TWICE DAILY   ONE TOUCH ULTRA TEST test strip Generic drug:  glucose blood TEST TWICE A DAY   ONETOUCH DELICA LANCETS FINE Misc TEST AS DIRECTED TWICE DAILY   polyethylene glycol powder powder Commonly known as:  GLYCOLAX/MIRALAX Take 255 g by mouth daily.   sildenafil 100 MG tablet Commonly known as:  VIAGRA TAKE AS DIRECTED   verapamil 240 MG CR tablet Commonly known as:  CALAN-SR TAKE ONE-HALF TABLET BY MOUTH TWICE DAILY       Allergies:  Allergies  Allergen Reactions  . Ace Inhibitors Other (See Comments)    Severe AKI due to ARB + dehydration Aug 2016  . Angiotensin Receptor Blockers Other (See Comments)    Severe AKI due to ARB + dehydration Aug 2016  . Losartan Other (See Comments)    Severe AKI due to ARB + dehydration Aug 2016    Past Medical History:  Diagnosis Date  . Chronic diastolic heart failure ([place])   . Constipation   . Diabetes mellitus   . Diverticulosis   . ED (erectile dysfunction)   . Gout   . Hyperlipidemia   . Hypertension   . IBS (irritable bowel syndrome)   . Renal calculus   . Renal insufficiency   . Tubular adenoma of colon     Past Surgical History:  Procedure Laterality Date  . COLONOSCOPY  1998   negative; no F/U  . knee effusion tapped      Family History  Problem Relation Age of Onset  . Heart attack Mother 57  . Hypertension Mother   . Prostate cancer Father 61  . Diabetes Father   . Irritable bowel syndrome Father   . Colon cancer Brother        dx in his late 56's  .  Irritable bowel syndrome Sister        x 2  . Irritable bowel syndrome Brother   . Stroke Neg Hx     Social History:  reports that he quit smoking about 26 years ago. His smoking use included cigarettes. He  smoked 0.30 packs per day. he has never used smokeless tobacco. He reports that he drinks about 1.2 oz of alcohol per week. He reports that he does not use drugs.    Review of Systems   RENAL dysfunction: He has stable function previously with creatinine about 1.38 but now higher  He has not taken Lasix along with the Invokana as suggested and recently also has taken it only occasionally when he has edema  Lab Results  Component Value Date   CREATININE 1.68 (H) 07/11/2017   CREATININE 1.88 (H) 05/17/2017   CREATININE 1.35 04/12/2017   CREATININE 1.35 12/18/2016           Lipids: Has been on pravastatin for hyperlipidemia, baseline LDL? 162; labs as follows  Lab Results  Component Value Date   CHOL 124 12/18/2016   HDL 42.40 12/18/2016   LDLCALC 66 12/18/2016   LDLDIRECT 80.1 04/15/2014   TRIG 77.0 12/18/2016   CHOLHDL 3 12/18/2016       The blood pressure has been high for several years and usually well controlled.  Currently taking verapamil, hydralazine, clonidine and metoprolol.  Has not checked blood pressure at home recently   Also on Lasix 20 mg, Followed by cardiologist for history of diastolic dysfunction     LABS:  Lab on 07/11/2017  Component Date Value Ref Range Status  . Sodium 07/11/2017 138  135 - 145 mEq/L Final  . Potassium 07/11/2017 4.9  3.5 - 5.1 mEq/L Final  . Chloride 07/11/2017 111  96 - 112 mEq/L Final  . CO2 07/11/2017 20  19 - 32 mEq/L Final  . Glucose, Bld 07/11/2017 96  70 - 99 mg/dL Final  . BUN 07/11/2017 35* 6 - 23 mg/dL Final  . Creatinine, Ser 07/11/2017 1.68* 0.40 - 1.50 mg/dL Final  . Calcium 07/11/2017 9.3  8.4 - 10.5 mg/dL Final  . GFR 07/11/2017 52.13* >60.00 mL/min Final    Physical Examination:  There were no  vitals taken for this visit.   Blood pressure was checked standing up also    ASSESSMENT/PLAN:   Diabetes type 2, uncontrolled with obesity, BMI 34    Last A1c 6% His blood sugars are fairly good although appeared to be higher when he was taking Invokana instead of Amaryl With this change he did lose weight which he had been not able to before Also trying to do a little more walking Recent blood sugars are fairly normal and he can continue Amaryl  RENAL dysfunction: This is likely to be from [facility] although he has not taken this for 2 weeks, blood pressure is lower than before  HYPERTENSION: Blood pressure is low normal Because of his renal dysfunction will reduce his verapamil down to one half daily only Reminded him to check blood pressure readings at home regularly Will have him follow-up in 6 weeks with BMP Take Lasix as needed for now    There are no Patient Instructions on file for this visit.   Elayne Snare 07/15/2017, 9:05 PM

## 2017-07-16 ENCOUNTER — Ambulatory Visit: Payer: Medicare Other | Admitting: Endocrinology

## 2017-07-19 ENCOUNTER — Ambulatory Visit: Payer: Medicare Other | Admitting: Endocrinology

## 2017-07-19 ENCOUNTER — Encounter: Payer: Self-pay | Admitting: Endocrinology

## 2017-07-19 VITALS — BP 130/78 | HR 43 | Temp 97.9°F | Wt 224.0 lb

## 2017-07-19 DIAGNOSIS — E1129 Type 2 diabetes mellitus with other diabetic kidney complication: Secondary | ICD-10-CM | POA: Diagnosis not present

## 2017-07-19 LAB — POCT GLYCOSYLATED HEMOGLOBIN (HGB A1C): Hemoglobin A1C: 6

## 2017-07-19 NOTE — Patient Instructions (Signed)
Check blood sugars on waking up  2/7 days  Also check blood sugars about 2 hours after a meal and do this after different meals by rotation  Recommended blood sugar levels on waking up is 90-130 and about 2 hours after meal is 130-160  Please bring your blood sugar monitor to each visit, thank you

## 2017-07-19 NOTE — Progress Notes (Signed)
Patient ID: Noah Barnes, male   DOB: 1945/09/28, 72 y.o.   MRN: [id number]    Reason for Appointment : F/u for Type 2 Diabetes  History of Present Illness          Diagnosis: Type 2 diabetes mellitus, date of diagnosis: 2008       Past history: His diabetes was borderline in the beginning and not clear what medications he was started on. Has been taking metformin for several years and this was later changed to [place]. Amaryl probably started in 5/14 when blood sugars were higher, initially was given 1 mg and now has been taking 2 mg. He had a relatively high A1c of 7.1, difficulty losing weight and a glucose of 202 on his initial consultation in 06/2013 He was then tried on Invokana in addition to his Janumet and Amaryl Invokana was reduced to 100 mg in early 2015 been creatinine had gone up to 1.6  Recent history:   Oral hypoglycemic drugs the patient is taking are: Amaryl 2 mg at supper, Janumet 50/1000 twice daily   His A1c is again 6.0, Previous range 5.8-6.4  Current management, blood sugar patterns and problems identified:  He has been continued on Amaryl along with his Janumet since late 2018  Previously did not have improvement with trying Invokana again and his renal function got worse  With Invokana he did lose weight but he has gained back only 3 pounds since his last visit  He has started doing regular walking with exercise at the gym more regularly now  GLUCOSE readings at home are being monitored only fasting despite instructions to vary the time of monitoring  No hypoglycemia symptoms reported  Side effects from medications have been:  renal dysfunction from Invokana  Glucose monitoring:  done 1 times a day         Glucometer: One Touch   Blood Glucose readings recently from download  FASTING readings range from 91-129 with AVERAGE 108 No readings after meals  Glycemic control:   Lab Results  Component Value Date   HGBA1C 6.0 07/19/2017   HGBA1C 6.0 04/10/2017   HGBA1C 6.4 12/18/2016   Lab Results  Component Value Date   MICROALBUR 1.8 04/12/2017   LDLCALC 66 12/18/2016   CREATININE 1.68 (H) 07/11/2017    Self-care: Usually has low-fat diet  Exercise: walking 3/7, 1.5 miles    Dietician consultations: 08/2013              Retinal exam: Most recent: 2013  Weight history:   Wt Readings from Last 3 Encounters:  07/19/17 224 lb (101.6 kg)  05/22/17 221 lb 9.6 oz (100.5 kg)  04/10/17 228 lb 12.8 oz (103.8 kg)       Allergies as of 07/19/2017      Reactions   Ace Inhibitors Other (See Comments)   Severe AKI due to ARB + dehydration Aug 2016   Angiotensin Receptor Blockers Other (See Comments)   Severe AKI due to ARB + dehydration Aug 2016   Losartan Other (See Comments)   Severe AKI due to ARB + dehydration Aug 2016      Medication List        Accurate as of 07/19/17 12:50 PM. Always use your most recent med list.          allopurinol 300 MG tablet Commonly known as:  ZYLOPRIM TAKE 1 TABLET BY MOUTH ONCE DAILY   aspirin EC 81 MG tablet Take 81 mg by mouth  every morning.   atorvastatin 20 MG tablet Commonly known as:  LIPITOR Take 20 mg by mouth daily.   cloNIDine 0.1 MG tablet Commonly known as:  CATAPRES TAKE 1 TABLET BY MOUTH TWICE DAILY   furosemide 20 MG tablet Commonly known as:  LASIX Take 1-2 tablets (20-40 mg total) by mouth daily as needed for fluid or edema.   glimepiride 2 MG tablet Commonly known as:  AMARYL TAKE ONE TABLET BY MOUTH ONCE DAILY BEFORE BREAKFAST   hydrALAZINE 25 MG tablet Commonly known as:  APRESOLINE TAKE 1 TABLET BY MOUTH EVERY 8 HOURS   JANUMET 50-1000 MG tablet Generic drug:  sitaGLIPtin-metformin TAKE 1 TABLET BY MOUTH TWICE DAILY WITH A MEAL   linaclotide 290 MCG Caps capsule Commonly known as:  LINZESS Take 1 capsule (290 mcg total) by mouth daily before breakfast.   LINZESS 290 MCG Caps capsule Generic drug:  linaclotide TAKE ONE CAPSULE BY  MOUTH ONCE DAILY BEFORE BREAKFAST   metoprolol tartrate 100 MG tablet Commonly known as:  LOPRESSOR TAKE 1 TABLET BY MOUTH TWICE DAILY   ONE TOUCH ULTRA TEST test strip Generic drug:  glucose blood TEST TWICE A DAY   ONETOUCH DELICA LANCETS FINE Misc TEST AS DIRECTED TWICE DAILY   polyethylene glycol powder powder Commonly known as:  GLYCOLAX/MIRALAX Take 255 g by mouth daily.   sildenafil 100 MG tablet Commonly known as:  VIAGRA TAKE AS DIRECTED   verapamil 240 MG CR tablet Commonly known as:  CALAN-SR TAKE ONE-HALF TABLET BY MOUTH TWICE DAILY       Allergies:  Allergies  Allergen Reactions  . Ace Inhibitors Other (See Comments)    Severe AKI due to ARB + dehydration Aug 2016  . Angiotensin Receptor Blockers Other (See Comments)    Severe AKI due to ARB + dehydration Aug 2016  . Losartan Other (See Comments)    Severe AKI due to ARB + dehydration Aug 2016    Past Medical History:  Diagnosis Date  . Chronic diastolic heart failure ([place])   . Constipation   . Diabetes mellitus   . Diverticulosis   . ED (erectile dysfunction)   . Gout   . Hyperlipidemia   . Hypertension   . IBS (irritable bowel syndrome)   . Renal calculus   . Renal insufficiency   . Tubular adenoma of colon     Past Surgical History:  Procedure Laterality Date  . COLONOSCOPY  1998   negative; no F/U  . knee effusion tapped      Family History  Problem Relation Age of Onset  . Heart attack Mother 58  . Hypertension Mother   . Prostate cancer Father 35  . Diabetes Father   . Irritable bowel syndrome Father   . Colon cancer Brother        dx in his late 67's  . Irritable bowel syndrome Sister        x 2  . Irritable bowel syndrome Brother   . Stroke Neg Hx     Social History:  reports that he quit smoking about 26 years ago. His smoking use included cigarettes. He smoked 0.30 packs per day. he has never used smokeless tobacco. He reports that he drinks about 1.2 oz of alcohol  per week. He reports that he does not use drugs.    Review of Systems   RENAL dysfunction:  This was relatively worse with a trial of Invokana in 2018 but has not come back to normal baseline  He was told to reduce verapamil dose by a half tablet in November because of low normal blood pressure He takes Lasix only occasionally, probably twice a week when he has swelling   Lab Results  Component Value Date   CREATININE 1.68 (H) 07/11/2017   CREATININE 1.88 (H) 05/17/2017   CREATININE 1.35 04/12/2017   CREATININE 1.35 12/18/2016           Lipids: Has been on pravastatin for hyperlipidemia, baseline LDL? 162; labs as follows  Lab Results  Component Value Date   CHOL 124 12/18/2016   HDL 42.40 12/18/2016   LDLCALC 66 12/18/2016   LDLDIRECT 80.1 04/15/2014   TRIG 77.0 12/18/2016   CHOLHDL 3 12/18/2016       The blood pressure has been high for several years and usually well controlled.  Currently taking verapamil 120 mg, hydralazine, clonidine and metoprolol.   Has not checked blood pressure at home recently     LABS:  Office Visit on 07/19/2017  Component Date Value Ref Range Status  . Hemoglobin A1C 07/19/2017 6.0   Final    Physical Examination:  BP 130/78 (BP Location: Left Arm, Patient Position: Sitting, Cuff Size: Normal)   Pulse (!) 43   Temp 97.9 F (36.6 C) (Oral)   Wt 224 lb (101.6 kg)   SpO2 95%   BMI 34.82 kg/m    Standing blood pressure about the same   ASSESSMENT/PLAN:   Diabetes type 2, uncontrolled with obesity, BMI 34    A1c is excellent at 6%  He is doing reasonably well with Amaryl and Janumet Recently has been exercising and fasting glucose readings are near normal most of the time He does need to check readings after meals at home also instead of just fasting Encouraged him to be consistent with diet to prevent any further weight gain  Follow-up in 4 months  RENAL dysfunction: This has still not improved back to baseline Not  clear if etiology, not related to nephropathy We will need to follow-up with his cardiologist to discuss adjustment of his antihypertensives He will Lasix as needed again  HYPERTENSION: Blood pressure is normal     Patient Instructions  Check blood sugars on waking up  2/7 days  Also check blood sugars about 2 hours after a meal and do this after different meals by rotation  Recommended blood sugar levels on waking up is 90-130 and about 2 hours after meal is 130-160  Please bring your blood sugar monitor to each visit, thank you     Elayne Snare 07/19/2017, 12:50 PM

## 2017-07-20 ENCOUNTER — Telehealth: Payer: Self-pay

## 2017-07-20 NOTE — Telephone Encounter (Signed)
Left message to call back to arrange appointment with Dr. Antionette Char assistant.

## 2017-07-20 NOTE — Telephone Encounter (Signed)
-----   Message from Sherren Mocha, MD sent at 07/19/2017 11:47 PM EST ----- Madaline Brilliant will arrange. thx ----- Message ----- From: Elayne Snare, MD Sent: 07/19/2017  12:53 PM To: Sherren Mocha, MD  Patient needs follow-up for medication adjustment and reassessment of renal function, thanks

## 2017-07-24 NOTE — Telephone Encounter (Signed)
Per patient request, scheduled patient 2/12 for evaluation. He was grateful for call and agrees with treatment plan.

## 2017-07-29 ENCOUNTER — Other Ambulatory Visit: Payer: Self-pay | Admitting: Internal Medicine

## 2017-08-01 ENCOUNTER — Telehealth: Payer: Self-pay | Admitting: Internal Medicine

## 2017-08-01 NOTE — Telephone Encounter (Signed)
Patient calling with constipation, has appt in April. Periodically he needs a bowel prep for constipation. Please advise if ok to send in prep for pt.

## 2017-08-01 NOTE — Telephone Encounter (Signed)
Patient states he is having constipation problems and wants to know what to do before appointment on 4.11.19.

## 2017-08-02 MED ORDER — NA SULFATE-K SULFATE-MG SULF 17.5-3.13-1.6 GM/177ML PO SOLN: 1.0000 | Freq: Once | ORAL | 1 refills | Status: AC

## 2017-08-02 NOTE — Telephone Encounter (Signed)
Ok for [employer] or SuPrep with 1 refill

## 2017-08-02 NOTE — Telephone Encounter (Signed)
Patient calling back regarding this.  °

## 2017-08-02 NOTE — Telephone Encounter (Signed)
Suprep sent to pharmacy and pt aware.

## 2017-08-03 ENCOUNTER — Other Ambulatory Visit: Payer: Self-pay

## 2017-08-07 ENCOUNTER — Other Ambulatory Visit: Payer: Self-pay | Admitting: Endocrinology

## 2017-08-08 ENCOUNTER — Ambulatory Visit (INDEPENDENT_AMBULATORY_CARE_PROVIDER_SITE_OTHER): Payer: Medicare Other | Admitting: *Deleted

## 2017-08-08 VITALS — BP 108/54 | HR 58 | Resp 18 | Ht 67.0 in | Wt 224.0 lb

## 2017-08-08 DIAGNOSIS — Z0000 Encounter for general adult medical examination without abnormal findings: Secondary | ICD-10-CM | POA: Diagnosis not present

## 2017-08-08 NOTE — Patient Instructions (Addendum)
Continue doing brain stimulating activities (puzzles, reading, adult coloring books, staying active) to keep memory sharp.   Continue to eat heart healthy diet (full of fruits, vegetables, whole grains, lean protein, water--limit salt, fat, and sugar intake) and increase physical activity as tolerated.   [name] , Thank you for taking time to come for your Medicare Wellness Visit. I appreciate your ongoing commitment to your health goals. Please review the following plan we discussed and let me know if I can assist you in the future.   These are the goals we discussed: Goals    . Exercise 3x per week (30 min per time)     Will increase exercise to walking daily [facility] [employer]; Will continue toward weight loss goals    . Patient Stated     Try as much as possible to stay ahead of the constipation. Continue to take the medications to help and follow the instructions of Dr. Hilarie Fredrickson.  Enjoy life, family and grand-daughter.       This is a list of the screening recommended for you and due dates:  Health Maintenance  Topic Date Due  . Tetanus Vaccine  01/03/1966  . Pneumonia vaccines (1 of 2 - PCV13) 01/04/2012  . Eye exam for diabetics  12/27/2014  . Complete foot exam   10/09/2017  . Hemoglobin A1C  01/16/2018  . Urine Protein Check  04/12/2018  . Colon Cancer Screening  03/04/2019  . Flu Shot  Completed  .  Hepatitis C: One time screening is recommended by Center for Disease Control  (CDC) for  adults born from [birth year] through 1965.   Completed

## 2017-08-08 NOTE — Progress Notes (Addendum)
Subjective:   Noah Barnes is a 72 y.o. male who presents for Medicare Annual/Subsequent preventive examination.  Review of Systems:  No ROS.  Medicare Wellness Visit. Additional risk factors are reflected in the social history.  Cardiac Risk Factors include: advanced age (>99men, >13 women);diabetes mellitus;hypertension;male gender;dyslipidemia Sleep patterns: feels rested on waking, does not get up to void and sleeps 7-8 hours nightly.   Home Safety/Smoke Alarms: Feels safe in home. Smoke alarms in place.  Living environment; residence and Firearm Safety: 1-story house/ trailer, firearms stored safely. Lives with wife, no needs for DME, good support system Seat Belt Safety/Bike Helmet: Wears seat belt.   PSA-  Lab Results  Component Value Date   PSA 0.70 12/18/2016   PSA 0.56 11/13/2012   PSA 0.70 05/09/2007       Objective:    Vitals: BP (!) 108/54   Pulse (!) 58   Resp 18   Ht 5\' 7"  (1.702 m)   Wt 224 lb (101.6 kg)   SpO2 99%   BMI 35.08 kg/m   Body mass index is 35.08 kg/m.  Advanced Directives 08/08/2017 02/24/2015 12/15/2014  Does Patient Have a Medical Advance Directive? No No No  Would patient like information on creating a medical advance directive? No - Patient declined - No - patient declined information    Tobacco Social History   Tobacco Use  Smoking Status Former Smoker  . Packs/day: 0.30  . Types: Cigarettes  . Last attempt to quit: 07/04/1991  . Years since quitting: 26.1  Smokeless Tobacco Never Used  Tobacco Comment   smoked age [age], up to 1/3 ppd; may be less     Counseling given: Not Answered Comment: smoked age [age], up to 1/3 ppd; may be less  Past Medical History:  Diagnosis Date  . Chronic diastolic heart failure ([place])   . Constipation   . Diabetes mellitus   . Diverticulosis   . ED (erectile dysfunction)   . Gout   . Hyperlipidemia   . Hypertension   . IBS (irritable bowel syndrome)   . Renal calculus   . Renal  insufficiency   . Tubular adenoma of colon    Past Surgical History:  Procedure Laterality Date  . COLONOSCOPY  1998   negative; no F/U  . knee effusion tapped     Family History  Problem Relation Age of Onset  . Heart attack Mother 34  . Hypertension Mother   . Prostate cancer Father 56  . Diabetes Father   . Irritable bowel syndrome Father   . Colon cancer Brother        dx in his late 60's  . Irritable bowel syndrome Sister        x 2  . Irritable bowel syndrome Brother   . Stroke Neg Hx    Social History   Socioeconomic History  . Marital status: Married    Spouse name: None  . Number of children: 1  . Years of education: None  . Highest education level: None  Social Needs  . Financial resource strain: Not hard at all  . Food insecurity - worry: Never true  . Food insecurity - inability: Never true  . Transportation needs - medical: Yes  . Transportation needs - non-medical: Yes  Occupational History  . Occupation: ..................    Employer: [employer]    Comment: [phone]  Tobacco Use  . Smoking status: Former Smoker    Packs/day: 0.30  Types: Cigarettes    Last attempt to quit: 07/04/1991    Years since quitting: 26.1  . Smokeless tobacco: Never Used  . Tobacco comment: smoked age [age], up to 1/3 ppd; may be less  Substance and Sexual Activity  . Alcohol use: Yes    Alcohol/week: 1.2 oz    Types: 2 Glasses of wine per week    Comment: rare  . Drug use: No  . Sexual activity: No  Other Topics Concern  . None  Social History Narrative   Exercise: walks 1 mile three times a week    Outpatient Encounter Medications as of 08/08/2017  Medication Sig  . allopurinol (ZYLOPRIM) 300 MG tablet TAKE 1 TABLET BY MOUTH ONCE DAILY  . aspirin EC 81 MG tablet Take 81 mg by mouth every morning.  [name] atorvastatin (LIPITOR) 20 MG tablet Take 20 mg by mouth daily.  . cloNIDine (CATAPRES) 0.1 MG tablet TAKE 1 TABLET BY MOUTH TWICE DAILY  .  furosemide (LASIX) 20 MG tablet Take 1-2 tablets (20-40 mg total) by mouth daily as needed for fluid or edema.  [name] glimepiride (AMARYL) 2 MG tablet TAKE ONE TABLET BY MOUTH ONCE DAILY BEFORE BREAKFAST  . hydrALAZINE (APRESOLINE) 25 MG tablet TAKE 1 TABLET BY MOUTH EVERY 8 HOURS  . JANUMET 50-1000 MG tablet TAKE 1 TABLET BY MOUTH TWICE DAILY WITH A MEAL  . linaclotide (LINZESS) 290 MCG CAPS capsule Take 1 capsule (290 mcg total) by mouth daily before breakfast.  . metoprolol tartrate (LOPRESSOR) 100 MG tablet TAKE 1 TABLET BY MOUTH TWICE DAILY  . ONE TOUCH ULTRA TEST test strip TEST TWICE A DAY  . ONETOUCH DELICA LANCETS FINE MISC USE AS DIRECTED TWICE DAILY  . polyethylene glycol powder (GLYCOLAX/MIRALAX) powder Take 255 g by mouth daily.  . sildenafil (VIAGRA) 100 MG tablet TAKE AS DIRECTED  . verapamil (CALAN-SR) 240 MG CR tablet TAKE ONE-HALF TABLET BY MOUTH TWICE DAILY  . [DISCONTINUED] LINZESS 290 MCG CAPS capsule TAKE 1 CAPSULE BY MOUTH ONCE DAILY BEFORE BREAKFAST   No facility-administered encounter medications on file as of 08/08/2017.     Activities of Daily Living In your present state of health, do you have any difficulty performing the following activities: 08/08/2017  Hearing? N  Vision? N  Difficulty concentrating or making decisions? N  Walking or climbing stairs? N  Dressing or bathing? N  Doing errands, shopping? N  Preparing Food and eating ? N  Using the Toilet? N  In the past six months, have you accidently leaked urine? N  Do you have problems with loss of bowel control? N  Managing your Medications? N  Managing your Finances? N  Housekeeping or managing your Housekeeping? N  Some recent data might be hidden    Patient Care Team: Binnie Rail, MD as PCP - General (Internal Medicine)   Assessment:   This is a routine wellness examination for Noah Barnes. Physical assessment deferred to PCP.   Exercise Activities and Dietary recommendations Current Exercise  Habits: Home exercise routine, Type of exercise: walking, Time (Minutes): 30, Frequency (Times/Week): 3, Weekly Exercise (Minutes/Week): 90, Intensity: Mild, Exercise limited by: None identified Diet (meal preparation, eat out, water intake, caffeinated beverages, dairy products, fruits and vegetables): in general, a "healthy" diet  , well balanced, eats a variety of fruits and vegetables daily, limits salt, fat/cholesterol, sugar, caffeine. Encouraged patient to increase daily water intake.  Goals    . Exercise 3x per week (30 min per time)  Will increase exercise to walking [facility]; Will continue toward weight loss goals    . Patient Stated     Try as much as possible to stay ahead of the constipation. Continue to take the medications to help and follow the instructions of Dr. Hilarie Fredrickson.  Enjoy life, family and grand-daughter.       Fall Risk Fall Risk  08/08/2017 07/12/2016 12/15/2014 09/02/2014 11/13/2012  Falls in the past year? No No No No No   Depression Screen PHQ 2/9 Scores 08/08/2017 07/12/2016 12/15/2014 09/02/2014  PHQ - 2 Score 0 0 0 0  PHQ- 9 Score 0 - - -    Cognitive Function MMSE - Mini Mental State Exam 08/08/2017 12/15/2014  Not completed: - Unable to complete  Orientation to time 5 -  Orientation to Place 5 -  Registration 3 -  Attention/ Calculation 5 -  Recall 3 -  Language- name 2 objects 2 -  Language- repeat 1 -  Language- follow 3 step command 3 -  Language- read & follow direction 1 -  Write a sentence 1 -  Copy design 1 -  Total score 30 -        Immunization History  Administered Date(s) Administered  . Influenza, High Dose Seasonal PF 07/12/2016, 04/10/2017   Screening Tests Health Maintenance  Topic Date Due  . TETANUS/TDAP  01/03/1966  . PNA vac Low Risk Adult (1 of 2 - PCV13) 01/04/2012  . OPHTHALMOLOGY EXAM  12/27/2014  . FOOT EXAM  10/09/2017  . HEMOGLOBIN A1C  01/16/2018  . URINE MICROALBUMIN  04/12/2018  .  COLONOSCOPY  03/04/2019  . INFLUENZA VACCINE  Completed  . Hepatitis C Screening  Completed      Plan:    Continue doing brain stimulating activities (puzzles, reading, adult coloring books, staying active) to keep memory sharp.   Continue to eat heart healthy diet (full of fruits, vegetables, whole grains, lean protein, water--limit salt, fat, and sugar intake) and increase physical activity as tolerated.  I have personally reviewed and noted the following in the patient's chart:   . Medical and social history . Use of alcohol, tobacco or illicit drugs  . Current medications and supplements . Functional ability and status . Nutritional status . Physical activity . Advanced directives . List of other physicians . Vitals . Screenings to include cognitive, depression, and falls . Referrals and appointments  In addition, I have reviewed and discussed with patient certain preventive protocols, quality metrics, and best practice recommendations. A written personalized care plan for preventive services as well as general preventive health recommendations were provided to patient.     Michiel Cowboy, RN  08/08/2017    Medical screening examination/treatment/procedure(s) were performed by non-physician practitioner and as supervising physician I was immediately available for consultation/collaboration. I agree with above. Binnie Rail, MD

## 2017-08-12 MED ORDER — ONETOUCH DELICA LANCETS FINE MISC: 5 refills | Status: DC

## 2017-08-13 ENCOUNTER — Other Ambulatory Visit: Payer: Self-pay

## 2017-08-13 MED ORDER — SITAGLIPTIN PHOS-METFORMIN HCL 50-1000 MG PO TABS: ORAL_TABLET | ORAL | 2 refills | Status: DC

## 2017-08-14 ENCOUNTER — Ambulatory Visit: Payer: Medicare Other | Admitting: Endocrinology

## 2017-08-14 ENCOUNTER — Encounter: Payer: Self-pay | Admitting: Physician Assistant

## 2017-08-14 ENCOUNTER — Ambulatory Visit: Payer: Medicare Other | Admitting: Physician Assistant

## 2017-08-14 VITALS — BP 102/60 | HR 50 | Ht 67.0 in | Wt 223.8 lb

## 2017-08-14 DIAGNOSIS — I10 Essential (primary) hypertension: Secondary | ICD-10-CM

## 2017-08-14 DIAGNOSIS — N183 Chronic kidney disease, stage 3 unspecified: Secondary | ICD-10-CM | POA: Insufficient documentation

## 2017-08-14 NOTE — Progress Notes (Signed)
Cardiology Office Note:    Date:  08/14/2017   ID:  Noah Barnes, DOB 01-19-1946, MRN [id number]  PCP:  Binnie Rail, MD  Cardiologist:  Sherren Mocha, MD   Referring MD: Binnie Rail, MD   Chief Complaint  Patient presents with  . Follow-up    HTN, CKD    History of Present Illness:    Noah Barnes is a 72 y.o. male with a hx of hypertension, hyperlipidemia, diabetes, abnormal EKG, diastolic heart failure, chronic kidney disease.  He was admitted in 2016 with acute renal failure.  An echocardiogram during that admission demonstrated normal LV function with inferior hypokinesis.  Follow-up nuclear stress test in 5/17 demonstrated inferior wall defect.  There was no ischemia.  As the patient has remained asymptomatic without angina, no further testing was pursued.  He was last seen by Dr. Burt Knack in October 2018.  He was recently seen by endocrinology.  He was asked to follow-up with cardiology to review his antihypertensives in the setting of chronic kidney disease.  Noah Barnes returns for evaluation of blood pressure.  He has not complaints.  He denies chest pain, shortness of breath, syncope.  He takes Lasix as needed for swelling.  He does not take it on a regular basis.  He checks his blood pressure occasionally at home and typically gets readings like we obtained today.    Prior CV studies:   The following studies were reviewed today:  Nuclear stress test 11/04/15 EF 46, inferior/inferolateral, apical defect (scar versus tissue attenuation), no ischemia, intermediate risk  Echo 03/04/15 EF 60-65, inferior hypokinesis, grade 1 diastolic dysfunction, mild MR, mild TR, PASP 36  Nuclear stress test 01/22/12 No ischemia, EF 57, normal study  Echo 01/17/12 Mild LVH, EF 55-60, normal wall motion, grade 1 diastolic dysfunction, trivial MR, mild LAE, PASP 28  Past Medical History:  Diagnosis Date  . Chronic diastolic heart failure ([place])   . Constipation   . Diabetes mellitus     . Diverticulosis   . ED (erectile dysfunction)   . Gout   . Hyperlipidemia   . Hypertension   . IBS (irritable bowel syndrome)   . Renal calculus   . Renal insufficiency   . Tubular adenoma of colon     Past Surgical History:  Procedure Laterality Date  . COLONOSCOPY  1998   negative; no F/U  . knee effusion tapped      Current Medications: Current Meds  Medication Sig  . allopurinol (ZYLOPRIM) 300 MG tablet TAKE 1 TABLET BY MOUTH ONCE DAILY  . aspirin EC 81 MG tablet Take 81 mg by mouth every morning.  [name] atorvastatin (LIPITOR) 20 MG tablet Take 20 mg by mouth daily.  . cloNIDine (CATAPRES) 0.1 MG tablet TAKE 1 TABLET BY MOUTH TWICE DAILY  . furosemide (LASIX) 20 MG tablet Take 1-2 tablets (20-40 mg total) by mouth daily as needed for fluid or edema.  [name] glimepiride (AMARYL) 2 MG tablet TAKE ONE TABLET BY MOUTH ONCE DAILY BEFORE BREAKFAST  . hydrALAZINE (APRESOLINE) 25 MG tablet Take 25 mg by mouth 2 (two) times daily.  [name] linaclotide (LINZESS) 290 MCG CAPS capsule Take 1 capsule (290 mcg total) by mouth daily before breakfast.  . metoprolol tartrate (LOPRESSOR) 100 MG tablet TAKE 1 TABLET BY MOUTH TWICE DAILY  . ONE TOUCH ULTRA TEST test strip TEST TWICE A DAY  . ONETOUCH DELICA LANCETS FINE MISC TEST AS DIRECTED once DAILY at various times  . polyethylene  glycol (MIRALAX / GLYCOLAX) packet Take 17 g by mouth as needed for mild constipation.  . sildenafil (VIAGRA) 100 MG tablet TAKE AS DIRECTED  . sitaGLIPtin-metformin (JANUMET) 50-1000 MG tablet TAKE 1 TABLET BY MOUTH TWICE DAILY WITH A MEAL  . verapamil (CALAN-SR) 240 MG CR tablet TAKE ONE-HALF TABLET BY MOUTH TWICE DAILY     Allergies:   Ace inhibitors; Angiotensin receptor blockers; and Losartan   Social History   Tobacco Use  . Smoking status: Former Smoker    Packs/day: 0.30    Types: Cigarettes    Last attempt to quit: 07/04/1991    Years since quitting: 26.1  . Smokeless tobacco: Never Used  . Tobacco comment:  smoked age [age], up to 1/3 ppd; may be less  Substance Use Topics  . Alcohol use: Yes    Alcohol/week: 1.2 oz    Types: 2 Glasses of wine per week    Comment: rare  . Drug use: No     Family Hx: The patient's family history includes Colon cancer in his brother; Diabetes in his father; Heart attack (age of onset: 74) in his mother; Hypertension in his mother; Irritable bowel syndrome in his brother, father, and sister; Prostate cancer (age of onset: 39) in his father. There is no history of Stroke.  ROS:   Please see the history of present illness.    ROS All other systems reviewed and are negative.   EKGs/Labs/Other Test Reviewed:    EKG:  EKG is not ordered today.    Recent Labs: 12/18/2016: Hemoglobin 12.9; Platelets 225.0; TSH 1.91 04/12/2017: ALT 24 07/11/2017: BUN 35; Creatinine, Ser 1.68; Potassium 4.9; Sodium 138   Recent Lipid Panel Lab Results  Component Value Date/Time   CHOL 124 12/18/2016 08:43 AM   TRIG 77.0 12/18/2016 08:43 AM   HDL 42.40 12/18/2016 08:43 AM   CHOLHDL 3 12/18/2016 08:43 AM   LDLCALC 66 12/18/2016 08:43 AM   LDLDIRECT 80.1 04/15/2014 09:53 AM    Physical Exam:    VS:  BP 102/60   Pulse (!) 50   Ht 5\' 7"  (1.702 m)   Wt 223 lb 12.8 oz (101.5 kg)   SpO2 97%   BMI 35.05 kg/m     Wt Readings from Last 3 Encounters:  08/14/17 223 lb 12.8 oz (101.5 kg)  08/08/17 224 lb (101.6 kg)  07/19/17 224 lb (101.6 kg)     Physical Exam  Constitutional: He is oriented to person, place, and time. He appears well-developed and well-nourished. No distress.  HENT:  Head: Normocephalic and atraumatic.  Neck: No JVD present. Carotid bruit is not present.  Cardiovascular: Normal rate and regular rhythm.  Pulmonary/Chest: Effort normal. He has no rales.  Abdominal: Soft. He exhibits no abdominal bruit.  Musculoskeletal: He exhibits no edema.  Neurological: He is alert and oriented to person, place, and time.  Skin: Skin is warm and dry.    ASSESSMENT  & PLAN:    1. Essential hypertension  His blood pressure is well controlled.  If anything, it is on the low side.  He is not symptomatic.  His HR is similar to historical heart rates.  He was taken off of his angiotensin receptor blocker in 2016 when he was admitted for acute renal failure.  He has had normal LV function.  His ejection fraction on his Nuclear stress test in 2017 was underestimated (the ejection fraction on echocardiogram was normal). He has an inferior wall motion abnormality and an inferior defect on  his nuclear study and has been managed medically as he has no symptoms to suggest obstructive coronary artery disease.  An ACE inhibitor or angiotensin receptor blocker would be reasonable from a cardiovascular perspective, but is not required (no documented myocardial infarction or systolic congestive heart failure).  His pressure is controlled and, considering his hx of acute renal failure, I would not try to re-challenge him at this point.  I did consider a renal artery ultrasound to rule out renal artery stenosis.  However, his blood pressure is controlled and his creatinine is stable.  I reviewed this with Dr. Liam Rogers (attending MD today) who agreed that we do not need to pursue a renal artery ultrasound.    -Continue current regimen  2. CKD (chronic kidney disease) stage 3, GFR 30-59 ml/min (HCC) Renal function stable.  He could be referred to Nephrology to manage his CKD.  I will leave this up to his primary care provider.   Dispo:  Return in about 8 months (around 04/13/2018) for Routine Follow Up, w/ Dr. Burt Knack.   Medication Adjustments/Labs and Tests Ordered: Current medicines are reviewed at length with the patient today.  Concerns regarding medicines are outlined above.  Tests Ordered: No orders of the defined types were placed in this encounter.  Medication Changes: No orders of the defined types were placed in this encounter.   Signed, Richardson Dopp, PA-C    08/14/2017 10:35 AM    [facility] [place], [place], [place]  [address] Phone: [phone]; Fax: [phone]

## 2017-08-14 NOTE — Patient Instructions (Addendum)
Medication Instructions:  Your physician recommends that you continue on your current medications as directed. Please refer to the Current Medication list given to you today.   Labwork: None ordered  Testing/Procedures: None ordered  Follow-Up: Your physician wants you to follow-up in: [address]. You will receive a reminder letter in the mail two months in advance. If you don't receive a letter, please call our office to schedule the follow-up appointment.   Any Other Special Instructions Will Be Listed Below (If Applicable).     If you need a refill on your cardiac medications before your next appointment, please call your pharmacy.

## 2017-08-20 ENCOUNTER — Other Ambulatory Visit: Payer: Self-pay | Admitting: Internal Medicine

## 2017-09-04 ENCOUNTER — Encounter: Payer: Self-pay | Admitting: Internal Medicine

## 2017-09-04 ENCOUNTER — Ambulatory Visit: Payer: Medicare Other | Admitting: Internal Medicine

## 2017-09-04 ENCOUNTER — Ambulatory Visit: Payer: Medicare Other | Admitting: Family Medicine

## 2017-09-04 VITALS — BP 110/64 | HR 68 | Temp 98.7°F | Resp 16 | Wt 226.0 lb

## 2017-09-04 DIAGNOSIS — J22 Unspecified acute lower respiratory infection: Secondary | ICD-10-CM

## 2017-09-04 MED ORDER — CEFDINIR 300 MG PO CAPS: 300.0000 mg | ORAL_CAPSULE | Freq: Two times a day (BID) | ORAL | 0 refills | Status: DC

## 2017-09-04 NOTE — Assessment & Plan Note (Signed)
History of bacterial bronchitis Mild wheezing on exam and a productive cough Concern for bacterial component Start Omnicef twice daily times 10 days Continue Mucinex Over-the-counter cold medications as needed Increase rest and fluids Call if no improvement or if symptoms worsen

## 2017-09-04 NOTE — Progress Notes (Signed)
Subjective:    Patient ID: Noah Barnes, male    DOB: Nov 09, 1945, 72 y.o.   MRN: [id number]  HPI He is here for an acute visit for cold symptoms.  His symptoms started several days ago  He is experiencing decreased appetite, fatigue, chills, initially he had a sore throat but that has improved, tightness in his chest, productive cough, shortness of breath and headaches.  He denies any fevers, nasal congestion, ear pain, sinus pain, wheezing or GI symptoms.  He denies body aches.  He has tried taking Mucinex   Medications and allergies reviewed with patient and updated if appropriate.  Patient Active Problem List   Diagnosis Date Noted  . CKD (chronic kidney disease) stage 3, GFR 30-59 ml/min (HCC) 08/14/2017  . Abdominal hernia without obstruction and without gangrene 01/10/2016  . Chronic diastolic heart failure ([place]) 02/25/2015  . Right renal mass 02/25/2015  . Other constipation 02/22/2015  . Chronic venous insufficiency 01/18/2015  . Superficial phlebitis 01/18/2015  . IBS (irritable bowel syndrome) 11/12/2013  . Family history of prostate cancer 12/26/2011  . Diabetes mellitus with renal complications ([place]) 71/24/5809  . History of gout 12/31/2007  . HYPERLIPIDEMIA 08/14/2007  . ERECTILE DYSFUNCTION 05/09/2007  . Abnormal electrocardiogram (ECG) (EKG) 05/09/2007  . Essential hypertension 11/28/2006    Current Outpatient Medications on File Prior to Visit  Medication Sig Dispense Refill  . allopurinol (ZYLOPRIM) 300 MG tablet TAKE 1 TABLET BY MOUTH ONCE DAILY 90 tablet 1  . aspirin EC 81 MG tablet Take 81 mg by mouth every morning.    [name] atorvastatin (LIPITOR) 20 MG tablet Take 20 mg by mouth daily.    . cloNIDine (CATAPRES) 0.1 MG tablet TAKE 1 TABLET BY MOUTH TWICE DAILY 180 tablet 1  . furosemide (LASIX) 20 MG tablet Take 1-2 tablets (20-40 mg total) by mouth daily as needed for fluid or edema. 30 tablet 2  . glimepiride (AMARYL) 2 MG tablet TAKE ONE TABLET BY  MOUTH ONCE DAILY BEFORE BREAKFAST 30 tablet 5  . hydrALAZINE (APRESOLINE) 25 MG tablet Take 25 mg by mouth 2 (two) times daily.    [name] linaclotide (LINZESS) 290 MCG CAPS capsule Take 1 capsule (290 mcg total) by mouth daily before breakfast. 90 capsule 1  . metoprolol tartrate (LOPRESSOR) 100 MG tablet TAKE 1 TABLET BY MOUTH TWICE DAILY 180 tablet 1  . ONE TOUCH ULTRA TEST test strip TEST TWICE A DAY 100 each 1  . ONETOUCH DELICA LANCETS FINE MISC TEST AS DIRECTED once DAILY at various times 50 each 5  . polyethylene glycol (MIRALAX / GLYCOLAX) packet Take 17 g by mouth as needed for mild constipation.    . sildenafil (VIAGRA) 100 MG tablet TAKE AS DIRECTED 6 tablet 2  . sitaGLIPtin-metformin (JANUMET) 50-1000 MG tablet TAKE 1 TABLET BY MOUTH TWICE DAILY WITH A MEAL 180 tablet 2  . verapamil (CALAN-SR) 240 MG CR tablet TAKE 1/2 (ONE-HALF) TABLET BY MOUTH TWICE DAILY 90 tablet 1   No current facility-administered medications on file prior to visit.     Past Medical History:  Diagnosis Date  . Chronic diastolic heart failure ([place])   . Constipation   . Diabetes mellitus   . Diverticulosis   . ED (erectile dysfunction)   . Gout   . Hyperlipidemia   . Hypertension   . IBS (irritable bowel syndrome)   . Renal calculus   . Renal insufficiency   . Tubular adenoma of colon     Past  Surgical History:  Procedure Laterality Date  . COLONOSCOPY  1998   negative; no F/U  . knee effusion tapped      Social History   Socioeconomic History  . Marital status: Married    Spouse name: None  . Number of children: 1  . Years of education: None  . Highest education level: None  Social Needs  . Financial resource strain: Not hard at all  . Food insecurity - worry: Never true  . Food insecurity - inability: Never true  . Transportation needs - medical: Yes  . Transportation needs - non-medical: Yes  Occupational History  . Occupation: ..................    Employer: [employer]     Comment: [phone]  Tobacco Use  . Smoking status: Former Smoker    Packs/day: 0.30    Types: Cigarettes    Last attempt to quit: 07/04/1991    Years since quitting: 26.1  . Smokeless tobacco: Never Used  . Tobacco comment: smoked age [age], up to 1/3 ppd; may be less  Substance and Sexual Activity  . Alcohol use: Yes    Alcohol/week: 1.2 oz    Types: 2 Glasses of wine per week    Comment: rare  . Drug use: No  . Sexual activity: No  Other Topics Concern  . None  Social History Narrative   Exercise: walks 1 mile three times a week    Family History  Problem Relation Age of Onset  . Heart attack Mother 55  . Hypertension Mother   . Prostate cancer Father 51  . Diabetes Father   . Irritable bowel syndrome Father   . Colon cancer Brother        dx in his late 100's  . Irritable bowel syndrome Sister        x 2  . Irritable bowel syndrome Brother   . Stroke Neg Hx     Review of Systems  Constitutional: Positive for appetite change, chills and fatigue. Negative for fever.  HENT: Positive for sore throat (initially). Negative for congestion, ear pain and sinus pain.   Respiratory: Positive for cough (productive), chest tightness and shortness of breath. Negative for wheezing.   Gastrointestinal: Negative for diarrhea and nausea.  Musculoskeletal: Negative for myalgias.  Neurological: Positive for headaches. Negative for dizziness and light-headedness.       Objective:   Vitals:   09/04/17 1605  BP: 110/64  Pulse: 68  Resp: 16  Temp: 98.7 F (37.1 C)  SpO2: 94%   Filed Weights   09/04/17 1605  Weight: 226 lb (102.5 kg)   Body mass index is 35.4 kg/m.  Wt Readings from Last 3 Encounters:  09/04/17 226 lb (102.5 kg)  08/14/17 223 lb 12.8 oz (101.5 kg)  08/08/17 224 lb (101.6 kg)     Physical Exam GENERAL APPEARANCE: Appears stated age, well appearing, NAD EYES: conjunctiva clear, no icterus HEENT: bilateral tympanic membranes and ear canals normal,  oropharynx with no erythema, no thyromegaly, trachea midline, no cervical or supraclavicular lymphadenopathy LUNGS:unlabored breathing, good air entry bilaterally, mild wheezes right lower lung CARDIOVASCULAR: Normal S1,S2 without murmurs, no edema SKIN: warm, dry        Assessment & Plan:   See Problem List for Assessment and Plan of chronic medical problems.

## 2017-09-04 NOTE — Patient Instructions (Signed)
Take the antibiotic as prescribed.  Continue the mucinex and other over the counter cold medications.    Your prescription(s) have been submitted to your pharmacy or been printed and provided for you. Please take as directed and contact our office if you believe you are having problem(s) with the medication(s) or have any questions.  If your symptoms worsen or fail to improve, please contact our office for further instruction, or in case of emergency go directly to the emergency room at the closest medical facility.   General Recommendations:    Please drink plenty of fluids.  Get plenty of rest   Sleep in humidified air  Use saline nasal sprays  Netti pot  OTC Medications:  Decongestants - helps relieve congestion   Flonase (generic fluticasone) or Nasacort (generic triamcinolone) - please make sure to use the "cross-over" technique at a 45 degree angle towards the opposite eye as opposed to straight up the nasal passageway.   Sudafed (generic pseudoephedrine - Note this is the one that is available behind the pharmacy counter); Products with phenylephrine (-PE) may also be used but is often not as effective as pseudoephedrine.   If you have HIGH BLOOD PRESSURE - Coricidin HBP; AVOID any product that is -D as this contains pseudoephedrine which may increase your blood pressure.  Afrin (oxymetazoline) every 6-8 hours for up to 3 days.  Allergies - helps relieve runny nose, itchy eyes and sneezing   Claritin (generic loratidine), Allegra (fexofenidine), or Zyrtec (generic cyrterizine) for runny nose. These medications should not cause drowsiness.  Note - Benadryl (generic diphenhydramine) may be used however may cause drowsiness  Cough -   Delsym or Robitussin (generic dextromethorphan)  Expectorants - helps loosen mucus to ease removal   Mucinex (generic guaifenesin) as directed on the package.  Headaches / General Aches   Tylenol (generic  acetaminophen) - DO NOT EXCEED 3 grams (3,000 mg) in a 24 hour time period  Advil/Motrin (generic ibuprofen)  Sore Throat -   Salt water gargle   Chloraseptic (generic benzocaine) spray or lozenges / Sucrets (generic dyclonine)

## 2017-09-19 ENCOUNTER — Other Ambulatory Visit: Payer: Self-pay | Admitting: Internal Medicine

## 2017-10-02 ENCOUNTER — Other Ambulatory Visit: Payer: Self-pay | Admitting: Internal Medicine

## 2017-10-11 ENCOUNTER — Encounter: Payer: Self-pay | Admitting: Internal Medicine

## 2017-10-11 ENCOUNTER — Ambulatory Visit: Payer: Medicare Other | Admitting: Internal Medicine

## 2017-10-11 VITALS — BP 116/72 | HR 64 | Ht 67.5 in | Wt 222.2 lb

## 2017-10-11 DIAGNOSIS — K5909 Other constipation: Secondary | ICD-10-CM | POA: Diagnosis not present

## 2017-10-11 DIAGNOSIS — Z86010 Personal history of colonic polyps: Secondary | ICD-10-CM

## 2017-10-11 DIAGNOSIS — K581 Irritable bowel syndrome with constipation: Secondary | ICD-10-CM | POA: Diagnosis not present

## 2017-10-11 MED ORDER — LINACLOTIDE 290 MCG PO CAPS: 290.0000 ug | ORAL_CAPSULE | Freq: Every day | ORAL | 1 refills | Status: DC

## 2017-10-11 MED ORDER — PEG-KCL-NACL-NASULF-NA ASC-C 140 G PO SOLR: 140.0000 g | ORAL | 0 refills | Status: DC

## 2017-10-11 NOTE — Progress Notes (Signed)
   Subjective:    Patient ID: Noah Barnes, male    DOB: [birth year], 72 y.o.   MRN: [id number]  HPI Noah Barnes is a 72 year old male with a history of IBS with chronic constipation, history of colon polyps and diverticulosis who is here for follow-up.  Last seen on 09/27/2016.  Here alone today.  He has been maintained on Linzess 290 mcg daily which for the most part works well for him.  He has needed a complete bowel purge with colonoscopy preparation about once per month.  This purge has been needed despite continuous Linzess therapy.  Usually for the purge she has been using MiraLAX prep but also has used sample preparations as well.  They are both effective for him.  When he uses MiraLAX with Linzess on an ongoing basis he often develops diarrhea.  He reports every 3-4 weeks he will have issues with bloating, some abdominal discomfort and ineffective/incomplete bowel movement.  This is when he feels a bowel purge as needed.  After prefers the Linzess seems to work very well.  No blood in his stool or melena.  No new upper GI or hepatobiliary complaint.  Last colonoscopy was 3-1/2 years ago, 03/03/2014 where 2 adenomatous polyps were removed, the largest 6 mm  Review of Systems As per HPI, otherwise negative  Current Medications, Allergies, Past Medical History, Past Surgical History, Family History and Social History were reviewed in [employer] [employer] record.     Objective:   Physical Exam BP 116/72   Pulse 64   Ht 5' 7.5" (1.715 m)   Wt 222 lb 4 oz (100.8 kg)   BMI 34.30 kg/m  Constitutional: Well-developed and well-nourished. No distress. HEENT: Normocephalic and atraumatic. No scleral icterus. Neck: Neck supple. Trachea midline. Cardiovascular: Normal rate, regular rhythm and intact distal pulses. No M/R/G Pulmonary/chest: Effort normal and breath sounds normal. No wheezing, rales or rhonchi. Abdominal: Soft, nontender, mildly distended with nontender umbilical  hernia.  Bowel sounds active throughout. Extremities: no clubbing, cyanosis, or edema Neurological: Alert and oriented to person place and time. Skin: Skin is warm and dry. Psychiatric: Normal mood and affect. Behavior is normal.      Assessment & Plan:  72 year old male with a history of IBS with chronic constipation, history of colon polyps and diverticulosis who is here for follow-up.  1.  Chronic constipation with IBS --he is doing well on Linzess but requiring complete bowel purge every 1-2 months.  We discussed the addition of another laxative but he prefers current regimen.  He will continue Linzess 290 mcg daily with occasional, as needed bowel purge.  He can use MiraLAX 255 g dissolved in 64 ounces of Gatorade which he takes in split fashion.  He was given 2 sample preps today to use in the future when needed (Plenvu).  If constipation becomes more frequent despite Linzess he is asked to notify me.  He voices understanding  2.  History of colon polyps --surveillance colonoscopy recommended September 2020  15 minutes spent with the patient today. Greater than 50% was spent in counseling and coordination of care with the patient

## 2017-10-11 NOTE — Patient Instructions (Addendum)
If you are age 71 or older, your body mass index should be between 23-30. Your Body mass index is 34.3 kg/m. If this is out of the aforementioned range listed, please consider follow up with your Primary Care Provider.  If you are age 70 or younger, your body mass index should be between 19-25. Your Body mass index is 34.3 kg/m. If this is out of the aformentioned range listed, please consider follow up with your Primary Care Provider.   Follow up with Dr Hilarie Fredrickson in 1 year.  Continue Linzess 290 mcg daily.   Use Plenvu bowel purge as needed, as previously directed.   Thank you,  Dr. Hilarie Fredrickson

## 2017-10-29 ENCOUNTER — Other Ambulatory Visit: Payer: Self-pay | Admitting: Endocrinology

## 2017-11-09 ENCOUNTER — Other Ambulatory Visit: Payer: Self-pay | Admitting: Internal Medicine

## 2017-11-13 ENCOUNTER — Other Ambulatory Visit (INDEPENDENT_AMBULATORY_CARE_PROVIDER_SITE_OTHER): Payer: Medicare Other

## 2017-11-13 DIAGNOSIS — E1129 Type 2 diabetes mellitus with other diabetic kidney complication: Secondary | ICD-10-CM | POA: Diagnosis not present

## 2017-11-13 LAB — COMPREHENSIVE METABOLIC PANEL
ALT: 23 U/L (ref 0–53)
AST: 14 U/L (ref 0–37)
Albumin: 3.9 g/dL (ref 3.5–5.2)
Alkaline Phosphatase: 63 U/L (ref 39–117)
BUN: 26 mg/dL — ABNORMAL HIGH (ref 6–23)
CHLORIDE: 112 meq/L (ref 96–112)
CO2: 21 mEq/L (ref 19–32)
Calcium: 9.6 mg/dL (ref 8.4–10.5)
Creatinine, Ser: 1.32 mg/dL (ref 0.40–1.50)
GFR: 68.79 mL/min (ref >60.00)
Glucose, Bld: 105 mg/dL — ABNORMAL HIGH (ref 70–99)
Potassium: 5 mEq/L (ref 3.5–5.1)
Sodium: 140 mEq/L (ref 135–145)
Total Bilirubin: 0.4 mg/dL (ref 0.2–1.2)
Total Protein: 6.8 g/dL (ref 6.0–8.3)

## 2017-11-13 LAB — HEMOGLOBIN A1C: Hgb A1c MFr Bld: 6 % (ref 4.6–6.5)

## 2017-11-16 ENCOUNTER — Ambulatory Visit: Payer: Medicare Other | Admitting: Endocrinology

## 2017-11-16 ENCOUNTER — Encounter: Payer: Self-pay | Admitting: Endocrinology

## 2017-11-16 VITALS — BP 130/80 | HR 54 | Ht 67.5 in | Wt 224.4 lb

## 2017-11-16 DIAGNOSIS — E119 Type 2 diabetes mellitus without complications: Secondary | ICD-10-CM | POA: Diagnosis not present

## 2017-11-16 NOTE — Progress Notes (Signed)
Patient ID: Noah Barnes, male   DOB: 1946/02/02, 72 y.o.   MRN: [id number]    Reason for Appointment : F/u for Type 2 Diabetes  History of Present Illness          Diagnosis: Type 2 diabetes mellitus, date of diagnosis: 2008       Past history: His diabetes was borderline in the beginning and not clear what medications he was started on. Has been taking metformin for several years and this was later changed to [place]. Amaryl probably started in 5/14 when blood sugars were higher, initially was given 1 mg and now has been taking 2 mg. He had a relatively high A1c of 7.1, difficulty losing weight and a glucose of 202 on his initial consultation in 06/2013 He was then tried on Invokana in addition to his Janumet and Amaryl Invokana was reduced to 100 mg in early 2015 been creatinine had gone up to 1.6  Recent history:   Oral hypoglycemic drugs the patient is taking are: Amaryl 2 mg at supper, Janumet 50/1000 twice daily   His A1c is again 6.0, Previous range 5.8-6.4  Current management, blood sugar patterns and problems identified:  He checks his blood sugars very sporadically and mostly in the mornings  Although his blood sugars are excellent he does not do any readings after meals especially after supper  Highest reading is 158 after lunch  His glucose was 73 before suppertime once because of skipping lunch  Although he has been occasionally walking he has not been doing this regularly as discussed previously  No side effects from Janumet  No hypoglycemic symptoms during the day, taking Amaryl in the evening  Side effects from medications have been:  renal dysfunction from Invokana  Glucose monitoring:  done <1 times a day         Glucometer: One Touch   Blood Glucose readings recently from download  MORNING readings range from 97-140 AVERAGE overall 117  Afternoon 73, 158  Glycemic control:   Lab Results  Component Value Date   HGBA1C 6.0 11/13/2017   HGBA1C 6.0 07/19/2017   HGBA1C 6.0 04/10/2017   Lab Results  Component Value Date   MICROALBUR 1.8 04/12/2017   LDLCALC 66 12/18/2016   CREATININE 1.32 11/13/2017    Self-care: Usually has low-fat diet  Exercise: walking 2-3/7, 1.5 miles    Dietician consultations: 08/2013              Retinal exam: Most recent: 2013  Weight history:   Wt Readings from Last 3 Encounters:  11/16/17 224 lb 6.4 oz (101.8 kg)  10/11/17 222 lb 4 oz (100.8 kg)  09/04/17 226 lb (102.5 kg)       Allergies as of 11/16/2017      Reactions   Ace Inhibitors Other (See Comments)   Severe AKI due to ARB + dehydration Aug 2016   Angiotensin Receptor Blockers Other (See Comments)   Severe AKI due to ARB + dehydration Aug 2016   Losartan Other (See Comments)   Severe AKI due to ARB + dehydration Aug 2016      Medication List        Accurate as of 11/16/17 10:03 AM. Always use your most recent med list.          allopurinol 300 MG tablet Commonly known as:  ZYLOPRIM TAKE 1 TABLET BY MOUTH ONCE DAILY   aspirin EC 81 MG tablet Take 81 mg by mouth every morning.  atorvastatin 20 MG tablet Commonly known as:  LIPITOR Take 20 mg by mouth daily.   cloNIDine 0.1 MG tablet Commonly known as:  CATAPRES TAKE 1 TABLET BY MOUTH TWICE DAILY   furosemide 20 MG tablet Commonly known as:  LASIX Take 1-2 tablets (20-40 mg total) by mouth daily as needed for fluid or edema.   glimepiride 2 MG tablet Commonly known as:  AMARYL TAKE ONE TABLET BY MOUTH ONCE DAILY BEFORE BREAKFAST   hydrALAZINE 25 MG tablet Commonly known as:  APRESOLINE Take 25 mg by mouth 2 (two) times daily.   linaclotide 290 MCG Caps capsule Commonly known as:  LINZESS Take 1 capsule (290 mcg total) by mouth daily before breakfast.   metoprolol tartrate 100 MG tablet Commonly known as:  LOPRESSOR TAKE 1 TABLET BY MOUTH TWICE DAILY   ONE TOUCH ULTRA TEST test strip Generic drug:  glucose blood USE TO TEST TWICE DAILY     ONETOUCH DELICA LANCETS FINE Misc TEST AS DIRECTED once DAILY at various times   ONETOUCH DELICA LANCETS FINE Misc USE AS DIRECTED TWICE DAILY   PEG-KCl-NaCl-NaSulf-Na Asc-C 140 g Solr Commonly known as:  PLENVU Take 140 g by mouth as directed.   polyethylene glycol packet Commonly known as:  MIRALAX / GLYCOLAX Take 17 g by mouth as needed for mild constipation.   sildenafil 100 MG tablet Commonly known as:  VIAGRA TAKE AS DIRECTED   sitaGLIPtin-metformin 50-1000 MG tablet Commonly known as:  JANUMET TAKE 1 TABLET BY MOUTH TWICE DAILY WITH A MEAL   verapamil 240 MG CR tablet Commonly known as:  CALAN-SR TAKE 1/2 (ONE-HALF) TABLET BY MOUTH TWICE DAILY       Allergies:  Allergies  Allergen Reactions  . Ace Inhibitors Other (See Comments)    Severe AKI due to ARB + dehydration Aug 2016  . Angiotensin Receptor Blockers Other (See Comments)    Severe AKI due to ARB + dehydration Aug 2016  . Losartan Other (See Comments)    Severe AKI due to ARB + dehydration Aug 2016    Past Medical History:  Diagnosis Date  . Chronic diastolic heart failure ([place])   . Constipation   . Diabetes mellitus   . Diverticulosis   . ED (erectile dysfunction)   . Gout   . Hyperlipidemia   . Hypertension   . IBS (irritable bowel syndrome)   . Renal calculus   . Renal insufficiency   . Tubular adenoma of colon     Past Surgical History:  Procedure Laterality Date  . COLONOSCOPY  1998   negative; no F/U  . knee effusion tapped      Family History  Problem Relation Age of Onset  . Heart attack Mother 35  . Hypertension Mother   . Prostate cancer Father 47  . Diabetes Father   . Irritable bowel syndrome Father   . Colon cancer Brother        dx in his late 47's  . Irritable bowel syndrome Sister        x 2  . Irritable bowel syndrome Brother   . Stroke Neg Hx     Social History:  reports that he quit smoking about 26 years ago. His smoking use included cigarettes. He  smoked 0.30 packs per day. He has never used smokeless tobacco. He reports that he drinks about 1.2 oz of alcohol per week. He reports that he does not use drugs.    Review of Systems   RENAL dysfunction:  This is improved now   Lab Results  Component Value Date   CREATININE 1.32 11/13/2017   CREATININE 1.68 (H) 07/11/2017   CREATININE 1.88 (H) 05/17/2017   CREATININE 1.35 04/12/2017           Lipids: Has been on Lipitor 20 mg now for hyperlipidemia, baseline LDL? 162 To have follow-up with PCP  Lab Results  Component Value Date   CHOL 124 12/18/2016   HDL 42.40 12/18/2016   LDLCALC 66 12/18/2016   LDLDIRECT 80.1 04/15/2014   TRIG 77.0 12/18/2016   CHOLHDL 3 12/18/2016       The blood pressure has been high for several years and usually well controlled.  Currently taking verapamil 120 mg, hydralazine, clonidine and metoprolol.   Has not checked blood pressure at home   He does get periodic edema   LABS:  Lab on 11/13/2017  Component Date Value Ref Range Status  . Sodium 11/13/2017 140  135 - 145 mEq/L Final  . Potassium 11/13/2017 5.0  3.5 - 5.1 mEq/L Final  . Chloride 11/13/2017 112  96 - 112 mEq/L Final  . CO2 11/13/2017 21  19 - 32 mEq/L Final  . Glucose, Bld 11/13/2017 105* 70 - 99 mg/dL Final  . BUN 11/13/2017 26* 6 - 23 mg/dL Final  . Creatinine, Ser 11/13/2017 1.32  0.40 - 1.50 mg/dL Final  . Total Bilirubin 11/13/2017 0.4  0.2 - 1.2 mg/dL Final  . Alkaline Phosphatase 11/13/2017 63  39 - 117 U/L Final  . AST 11/13/2017 14  0 - 37 U/L Final  . ALT 11/13/2017 23  0 - 53 U/L Final  . Total Protein 11/13/2017 6.8  6.0 - 8.3 g/dL Final  . Albumin 11/13/2017 3.9  3.5 - 5.2 g/dL Final  . Calcium 11/13/2017 9.6  8.4 - 10.5 mg/dL Final  . GFR 11/13/2017 68.79  >60.00 mL/min Final  . Hgb A1c MFr Bld 11/13/2017 6.0  4.6 - 6.5 % Final   Glycemic Control Guidelines for People with Diabetes:Non Diabetic:  <6%Goal of Therapy: <7%Additional Action Suggested:  >8%      Physical Examination:  BP 130/80 (BP Location: Left Arm, Patient Position: Sitting, Cuff Size: Normal)   Pulse (!) 54   Ht 5' 7.5" (1.715 m)   Wt 224 lb 6.4 oz (101.8 kg)   SpO2 98%   BMI 34.63 kg/m       ASSESSMENT/PLAN:   Diabetes type 2, uncontrolled with obesity, BMI 34    A1c is excellent at 6% and this is very consistent  His main difficulty is losing weight He can do better with his physical activity and exercise level and encouraged him to do so Also needs to check readings after meals Currently no tendency to hypoglycemia with Amaryl although his reading was 73 once but due to missing his lunch  Follow-up in 4 months  RENAL dysfunction: This has improved  HYPERTENSION: Blood pressure is recently well controlled     There are no Patient Instructions on file for this visit.   Elayne Snare 11/16/2017, 10:03 AM

## 2017-11-16 NOTE — Patient Instructions (Signed)
Check blood sugars on waking up  3/7  Also check blood sugars about 2 hours after a meal and do this after different meals by rotation  Recommended blood sugar levels on waking up is 90-130 and about 2 hours after meal is 130-160  Please bring your blood sugar monitor to each visit, thank you  Do not skip lunch

## 2017-12-03 ENCOUNTER — Other Ambulatory Visit: Payer: Self-pay | Admitting: Internal Medicine

## 2017-12-19 NOTE — Patient Instructions (Addendum)
Test(s) ordered today. Your results will be released to [place] (or called to you) after review, usually within 72hours after test completion. If any changes need to be made, you will be notified at that same time.  All other Health Maintenance issues reviewed.   All recommended immunizations and age-appropriate screenings are up-to-date or discussed.  No immunizations administered today.   Consider getting a tetanus vaccine at your pharmacy.    Medications reviewed and updated.  No changes recommended at this time.   Please followup in one year    Health Maintenance, Male A healthy lifestyle and preventive care is important for your health and wellness. Ask your health care provider about what schedule of regular examinations is right for you. What should I know about weight and diet? Eat a Healthy Diet  Eat plenty of vegetables, fruits, whole grains, low-fat dairy products, and lean protein.  Do not eat a lot of foods high in solid fats, added sugars, or salt.  Maintain a Healthy Weight Regular exercise can help you achieve or maintain a healthy weight. You should:  Do at least 150 minutes of exercise each week. The exercise should increase your heart rate and make you sweat (moderate-intensity exercise).  Do strength-training exercises at least twice a week.  Watch Your Levels of Cholesterol and Blood Lipids  Have your blood tested for lipids and cholesterol every 5 years starting at 71 years of age. If you are at high risk for heart disease, you should start having your blood tested when you are 71 years old. You may need to have your cholesterol levels checked more often if: ? Your lipid or cholesterol levels are high. ? You are older than 71 years of age. ? You are at high risk for heart disease.  What should I know about cancer screening? Many types of cancers can be detected early and may often be prevented. Lung Cancer  You should be screened every year for lung  cancer if: ? You are a current smoker who has smoked for at least 30 years. ? You are a former smoker who has quit within the past 15 years.  Talk to your health care provider about your screening options, when you should start screening, and how often you should be screened.  Colorectal Cancer  Routine colorectal cancer screening usually begins at 71 years of age and should be repeated every 5-10 years until you are 71 years old. You may need to be screened more often if early forms of precancerous polyps or small growths are found. Your health care provider may recommend screening at an earlier age if you have risk factors for colon cancer.  Your health care provider may recommend using home test kits to check for hidden blood in the stool.  A small camera at the end of a tube can be used to examine your colon (sigmoidoscopy or colonoscopy). This checks for the earliest forms of colorectal cancer.  Prostate and Testicular Cancer  Depending on your age and overall health, your health care provider may do certain tests to screen for prostate and testicular cancer.  Talk to your health care provider about any symptoms or concerns you have about testicular or prostate cancer.  Skin Cancer  Check your skin from head to toe regularly.  Tell your health care provider about any new moles or changes in moles, especially if: ? There is a change in a mole's size, shape, or color. ? You have a mole that is larger  than a pencil eraser.  Always use sunscreen. Apply sunscreen liberally and repeat throughout the day.  Protect yourself by wearing long sleeves, pants, a wide-brimmed hat, and sunglasses when outside.  What should I know about heart disease, diabetes, and high blood pressure?  If you are 8-34 years of age, have your blood pressure checked every 3-5 years. If you are 25 years of age or older, have your blood pressure checked every year. You should have your blood pressure measured  twice-once when you are at a hospital or clinic, and once when you are not at a hospital or clinic. Record the average of the two measurements. To check your blood pressure when you are not at a hospital or clinic, you can use: ? An automated blood pressure machine at a pharmacy. ? A home blood pressure monitor.  Talk to your health care provider about your target blood pressure.  If you are between 63-74 years old, ask your health care provider if you should take aspirin to prevent heart disease.  Have regular diabetes screenings by checking your fasting blood sugar level. ? If you are at a normal weight and have a low risk for diabetes, have this test once every three years after the age of 24. ? If you are overweight and have a high risk for diabetes, consider being tested at a younger age or more often.  A one-time screening for abdominal aortic aneurysm (AAA) by ultrasound is recommended for men aged 35-75 years who are current or former smokers. What should I know about preventing infection? Hepatitis B If you have a higher risk for hepatitis B, you should be screened for this virus. Talk with your health care provider to find out if you are at risk for hepatitis B infection. Hepatitis C Blood testing is recommended for:  Everyone born from [birth year] through 1964.  Anyone with known risk factors for hepatitis C.  Sexually Transmitted Diseases (STDs)  You should be screened each year for STDs including gonorrhea and chlamydia if: ? You are sexually active and are younger than 71 years of age. ? You are older than 71 years of age and your health care provider tells you that you are at risk for this type of infection. ? Your sexual activity has changed since you were last screened and you are at an increased risk for chlamydia or gonorrhea. Ask your health care provider if you are at risk.  Talk with your health care provider about whether you are at high risk of being infected with HIV.  Your health care provider may recommend a prescription medicine to help prevent HIV infection.  What else can I do?  Schedule regular health, dental, and eye exams.  Stay current with your vaccines (immunizations).  Do not use any tobacco products, such as cigarettes, chewing tobacco, and e-cigarettes. If you need help quitting, ask your health care provider.  Limit alcohol intake to no more than 2 drinks per day. One drink equals 12 ounces of beer, 5 ounces of wine, or 1 ounces of hard liquor.  Do not use street drugs.  Do not share needles.  Ask your health care provider for help if you need support or information about quitting drugs.  Tell your health care provider if you often feel depressed.  Tell your health care provider if you have ever been abused or do not feel safe at home. This information is not intended to replace advice given to you by your health care provider.  Make sure you discuss any questions you have with your health care provider. Document Released: 12/16/2007 Document Revised: 02/16/2016 Document Reviewed: 03/23/2015 Elsevier Interactive Patient Education  [employer].

## 2017-12-19 NOTE — Progress Notes (Signed)
Subjective:    Patient ID: Noah Barnes, male    DOB: 05-04-1946, 72 y.o.   MRN: [id number]  HPI He is here for a physical exam.     Medications and allergies reviewed with patient and updated if appropriate.  Patient Active Problem List   Diagnosis Date Noted  . CKD (chronic kidney disease) stage 3, GFR 30-59 ml/min (HCC) 08/14/2017  . Abdominal hernia without obstruction and without gangrene 01/10/2016  . Chronic diastolic heart failure ([place]) 02/25/2015  . Right renal mass 02/25/2015  . Other constipation 02/22/2015  . Chronic venous insufficiency 01/18/2015  . Superficial phlebitis 01/18/2015  . IBS (irritable bowel syndrome) 11/12/2013  . Family history of prostate cancer 12/26/2011  . Diabetes mellitus with renal complications ([place]) 19/41/7408  . Gout 12/31/2007  . HYPERLIPIDEMIA 08/14/2007  . ERECTILE DYSFUNCTION 05/09/2007  . Abnormal electrocardiogram (ECG) (EKG) 05/09/2007  . Essential hypertension 11/28/2006    Current Outpatient Medications on File Prior to Visit  Medication Sig Dispense Refill  . allopurinol (ZYLOPRIM) 300 MG tablet TAKE 1 TABLET BY MOUTH ONCE DAILY 90 tablet 0  . aspirin EC 81 MG tablet Take 81 mg by mouth every morning.    [name] atorvastatin (LIPITOR) 20 MG tablet Take 20 mg by mouth daily.    . cloNIDine (CATAPRES) 0.1 MG tablet TAKE 1 TABLET BY MOUTH TWICE DAILY 180 tablet 0  . furosemide (LASIX) 20 MG tablet Take 1-2 tablets (20-40 mg total) by mouth daily as needed for fluid or edema. 30 tablet 2  . glimepiride (AMARYL) 2 MG tablet TAKE ONE TABLET BY MOUTH ONCE DAILY BEFORE BREAKFAST 30 tablet 5  . hydrALAZINE (APRESOLINE) 25 MG tablet Take 25 mg by mouth 2 (two) times daily.    [name] linaclotide (LINZESS) 290 MCG CAPS capsule Take 1 capsule (290 mcg total) by mouth daily before breakfast. 90 capsule 1  . metoprolol tartrate (LOPRESSOR) 100 MG tablet TAKE 1 TABLET BY MOUTH TWICE DAILY 180 tablet 0  . ONE TOUCH ULTRA TEST test strip USE TO TEST  TWICE DAILY 100 each 3  . ONETOUCH DELICA LANCETS FINE MISC USE AS DIRECTED TWICE DAILY 100 each 0  . PEG-KCl-NaCl-NaSulf-Na Asc-C (PLENVU) 140 g SOLR Take 140 g by mouth as directed. 2 each 0  . polyethylene glycol (MIRALAX / GLYCOLAX) packet Take 17 g by mouth as needed for mild constipation.    . sildenafil (VIAGRA) 100 MG tablet TAKE AS DIRECTED 6 tablet 1  . sitaGLIPtin-metformin (JANUMET) 50-1000 MG tablet TAKE 1 TABLET BY MOUTH TWICE DAILY WITH A MEAL 180 tablet 2  . verapamil (CALAN-SR) 240 MG CR tablet TAKE 1/2 (ONE-HALF) TABLET BY MOUTH TWICE DAILY 90 tablet 1   No current facility-administered medications on file prior to visit.     Past Medical History:  Diagnosis Date  . Chronic diastolic heart failure ([place])   . Constipation   . Diabetes mellitus   . Diverticulosis   . ED (erectile dysfunction)   . Gout   . Hyperlipidemia   . Hypertension   . IBS (irritable bowel syndrome)   . Renal calculus   . Renal insufficiency   . Tubular adenoma of colon     Past Surgical History:  Procedure Laterality Date  . COLONOSCOPY  1998   negative; no F/U  . knee effusion tapped      Social History   Socioeconomic History  . Marital status: Married    Spouse name: Not on file  . Number of  children: 1  . Years of education: Not on file  . Highest education level: Not on file  Occupational History  . Occupation: ..................    Employer: [employer]    Comment: [phone]  Social Needs  . Financial resource strain: Not hard at all  . Food insecurity:    Worry: Never true    Inability: Never true  . Transportation needs:    Medical: Yes    Non-medical: Yes  Tobacco Use  . Smoking status: Former Smoker    Packs/day: 0.30    Types: Cigarettes    Last attempt to quit: 07/04/1991    Years since quitting: 26.4  . Smokeless tobacco: Never Used  . Tobacco comment: smoked age [age], up to 1/3 ppd; may be less  Substance and Sexual Activity  . Alcohol use:  Yes    Alcohol/week: 1.2 oz    Types: 2 Glasses of wine per week    Comment: rare  . Drug use: No  . Sexual activity: Never  Lifestyle  . Physical activity:    Days per week: 3 days    Minutes per session: 30 min  . Stress: Not at all  Relationships  . Social connections:    Talks on phone: More than three times a week    Gets together: More than three times a week    Attends religious service: More than 4 times per year    Active member of club or organization: Not on file    Attends meetings of clubs or organizations: More than 4 times per year    Relationship status: Married  Other Topics Concern  . Not on file  Social History Narrative   Exercise: walks 1 mile three times a week    Family History  Problem Relation Age of Onset  . Heart attack Mother 35  . Hypertension Mother   . Prostate cancer Father 43  . Diabetes Father   . Irritable bowel syndrome Father   . Colon cancer Brother        dx in his late 72's  . Irritable bowel syndrome Sister        x 2  . Irritable bowel syndrome Brother   . Stroke Neg Hx     Review of Systems  Constitutional: Negative for chills, fatigue and fever.  Eyes: Negative for visual disturbance.  Respiratory: Negative for cough, shortness of breath and wheezing.   Cardiovascular: Positive for leg swelling (mild, occasional). Negative for chest pain and palpitations.  Gastrointestinal: Positive for abdominal distention (IBS related), abdominal pain (IBS related) and constipation. Negative for blood in stool, diarrhea and nausea.       No gerd  Genitourinary: Negative for difficulty urinating and dysuria.  Musculoskeletal: Positive for arthralgias (knee pain). Negative for back pain.  Skin: Negative for color change and rash.  Neurological: Negative for dizziness, light-headedness, numbness and headaches.  Psychiatric/Behavioral: Negative for dysphoric mood and sleep disturbance. The patient is not nervous/anxious.          Objective:   Vitals:   12/20/17 0805  BP: (!) 98/56  Pulse: (!) 56  Resp: 16  Temp: 98.1 F (36.7 C)  SpO2: 95%   Filed Weights   12/20/17 0805  Weight: 225 lb (102.1 kg)   Body mass index is 34.72 kg/m.   BP Readings from Last 3 Encounters:  12/20/17 (!) 98/56  11/16/17 130/80  10/11/17 116/72    Wt Readings from Last 3 Encounters:  12/20/17 225  lb (102.1 kg)  11/16/17 224 lb 6.4 oz (101.8 kg)  10/11/17 222 lb 4 oz (100.8 kg)     Physical Exam Constitutional: He appears well-developed and well-nourished. No distress.  HENT:  Head: Normocephalic and atraumatic.  Right Ear: External ear normal.  Left Ear: External ear normal.  Mouth/Throat: Oropharynx is clear and moist.  Normal ear canals and TM b/l  Eyes: Conjunctivae and EOM are normal.  Neck: Neck supple. No tracheal deviation present. No thyromegaly present.  No carotid bruit  Cardiovascular: Normal rate, regular rhythm, normal heart sounds and intact distal pulses.  2/6 systolic murmur heard.  1 + mild b/l LE edema Pulmonary/Chest: Effort normal and breath sounds normal. No respiratory distress. He has no wheezes. He has no rales.  Abdominal: Soft. Ventral hernia and umbilical hernia - non-tender and reducible. He exhibits no distension. There is no tenderness.  Genitourinary: deferred  Lymphadenopathy:   He has no cervical adenopathy.  Skin: Skin is warm and dry. He is not diaphoretic.  Psychiatric: He has a normal mood and affect. His behavior is normal.    Diabetic Foot Exam - Simple   Simple Foot Form Diabetic Foot exam was performed with the following findings:  Yes 12/20/2017  8:34 AM  Visual Inspection No deformities, no ulcerations, no other skin breakdown bilaterally:  Yes Sensation Testing Intact to touch and monofilament testing bilaterally:  Yes Pulse Check Posterior Tibialis and Dorsalis pulse intact bilaterally:  Yes Comments         Assessment & Plan:   Physical exam: Screening  blood work oredered Immunizations   discussed shingles, tetanus due, discussed pneumonia vaccines Colonoscopy   up-to-date Eye exams   up-to-date EKG   last done 04/2017 Exercise  2-3 times a [place], rides bike, then some weights - encouarged exercising 3-4 times a week Weight Skin  Substance abuse  none  See Problem List for Assessment and Plan of chronic medical problems.    Follow-up in 1 year

## 2017-12-20 ENCOUNTER — Encounter: Payer: Self-pay | Admitting: Internal Medicine

## 2017-12-20 ENCOUNTER — Other Ambulatory Visit (INDEPENDENT_AMBULATORY_CARE_PROVIDER_SITE_OTHER): Payer: Medicare Other

## 2017-12-20 ENCOUNTER — Ambulatory Visit (INDEPENDENT_AMBULATORY_CARE_PROVIDER_SITE_OTHER): Payer: Medicare Other | Admitting: Internal Medicine

## 2017-12-20 VITALS — BP 98/56 | HR 56 | Temp 98.1°F | Resp 16 | Ht 67.5 in | Wt 225.0 lb

## 2017-12-20 DIAGNOSIS — I340 Nonrheumatic mitral (valve) insufficiency: Secondary | ICD-10-CM

## 2017-12-20 DIAGNOSIS — K581 Irritable bowel syndrome with constipation: Secondary | ICD-10-CM | POA: Diagnosis not present

## 2017-12-20 DIAGNOSIS — I10 Essential (primary) hypertension: Secondary | ICD-10-CM | POA: Diagnosis not present

## 2017-12-20 DIAGNOSIS — N183 Chronic kidney disease, stage 3 unspecified: Secondary | ICD-10-CM

## 2017-12-20 DIAGNOSIS — I5032 Chronic diastolic (congestive) heart failure: Secondary | ICD-10-CM | POA: Diagnosis not present

## 2017-12-20 DIAGNOSIS — E782 Mixed hyperlipidemia: Secondary | ICD-10-CM

## 2017-12-20 DIAGNOSIS — M109 Gout, unspecified: Secondary | ICD-10-CM

## 2017-12-20 DIAGNOSIS — Z0001 Encounter for general adult medical examination with abnormal findings: Secondary | ICD-10-CM | POA: Diagnosis not present

## 2017-12-20 DIAGNOSIS — E1129 Type 2 diabetes mellitus with other diabetic kidney complication: Secondary | ICD-10-CM

## 2017-12-20 DIAGNOSIS — K469 Unspecified abdominal hernia without obstruction or gangrene: Secondary | ICD-10-CM | POA: Diagnosis not present

## 2017-12-20 LAB — CBC WITH DIFFERENTIAL/PLATELET
Basophils Absolute: 0.1 10*3/uL (ref 0.0–0.1)
Basophils Relative: 1.7 % (ref 0.0–3.0)
Eosinophils Absolute: 0.1 10*3/uL (ref 0.0–0.7)
Eosinophils Relative: 2.6 % (ref 0.0–5.0)
HEMATOCRIT: 39.2 % (ref 39.0–52.0)
Hemoglobin: 12.6 g/dL — ABNORMAL LOW (ref 13.0–17.0)
Lymphocytes Relative: 26.5 % (ref 12.0–46.0)
Lymphs Abs: 1.3 10*3/uL (ref 0.7–4.0)
MCHC: 32.1 g/dL (ref 30.0–36.0)
MCV: 83.9 fl (ref 78.0–100.0)
Monocytes Absolute: 0.4 10*3/uL (ref 0.1–1.0)
Monocytes Relative: 7.7 % (ref 3.0–12.0)
Neutro Abs: 3.1 10*3/uL (ref 1.4–7.7)
Neutrophils Relative %: 61.5 % (ref 43.0–77.0)
Platelets: 238 10*3/uL (ref 150.0–400.0)
RBC: 4.68 Mil/uL (ref 4.22–5.81)
RDW: 15.2 % (ref 11.5–15.5)
WBC: 5.1 10*3/uL (ref 4.0–10.5)

## 2017-12-20 LAB — COMPREHENSIVE METABOLIC PANEL
ALK PHOS: 66 U/L (ref 39–117)
ALT: 29 U/L (ref 0–53)
AST: 13 U/L (ref 0–37)
Albumin: 4 g/dL (ref 3.5–5.2)
BUN: 32 mg/dL — ABNORMAL HIGH (ref 6–23)
CO2: 17 mEq/L — ABNORMAL LOW (ref 19–32)
Calcium: 9.5 mg/dL (ref 8.4–10.5)
Chloride: 113 mEq/L — ABNORMAL HIGH (ref 96–112)
Creatinine, Ser: 1.47 mg/dL (ref 0.40–1.50)
GFR: 60.74 mL/min (ref >60.00)
Glucose, Bld: 115 mg/dL — ABNORMAL HIGH (ref 70–99)
Potassium: 4.6 mEq/L (ref 3.5–5.1)
Sodium: 139 mEq/L (ref 135–145)
Total Bilirubin: 0.4 mg/dL (ref 0.2–1.2)
Total Protein: 6.8 g/dL (ref 6.0–8.3)

## 2017-12-20 LAB — LIPID PANEL
Cholesterol: 132 mg/dL (ref 0–200)
HDL: 40.8 mg/dL (ref >39.00)
LDL Cholesterol: 78 mg/dL (ref 0–99)
NonHDL: 91.4
TRIGLYCERIDES: 68 mg/dL (ref 0.0–149.0)
Total CHOL/HDL Ratio: 3
VLDL: 13.6 mg/dL (ref 0.0–40.0)

## 2017-12-20 LAB — MICROALBUMIN / CREATININE URINE RATIO
Creatinine,U: 286.1 mg/dL
Microalb Creat Ratio: 1.2 mg/g (ref 0.0–30.0)
Microalb, Ur: 3.5 mg/dL — ABNORMAL HIGH (ref 0.0–1.9)

## 2017-12-20 LAB — TSH: TSH: 2.31 u[IU]/mL (ref 0.35–4.50)

## 2017-12-20 LAB — HEMOGLOBIN A1C: Hgb A1c MFr Bld: 6.2 % (ref 4.6–6.5)

## 2017-12-20 LAB — URIC ACID: Uric Acid, Serum: 4.7 mg/dL (ref 4.0–7.8)

## 2017-12-20 NOTE — Assessment & Plan Note (Signed)
Ventral and umbilical  Asymptomatic Reducible and non tender No intervention needed

## 2017-12-20 NOTE — Assessment & Plan Note (Signed)
a1c 6.0% management per Dr Dwyane Dee Advised to increase exercise

## 2017-12-20 NOTE — Assessment & Plan Note (Signed)
Check lipid panel  Continue daily statin Regular exercise and healthy diet encouraged  

## 2017-12-20 NOTE — Assessment & Plan Note (Signed)
euvolemic Mild ankle edema - chronic and stable continue current medications

## 2017-12-20 NOTE — Assessment & Plan Note (Signed)
BP Readings from Last 3 Encounters:  12/20/17 (!) 98/56  11/16/17 130/80  10/11/17 116/72    BP well controlled Current regimen effective and well tolerated Continue current medications at current doses cmp

## 2017-12-20 NOTE — Assessment & Plan Note (Signed)
Improved cmp

## 2017-12-20 NOTE — Assessment & Plan Note (Signed)
Following with GI Chronic constipation stable

## 2017-12-20 NOTE — Assessment & Plan Note (Signed)
Murmur - has known mild MR, mild TR monitor

## 2017-12-20 NOTE — Assessment & Plan Note (Signed)
No gout symptoms since last visit Check uric acid Continue allopurinol

## 2017-12-21 ENCOUNTER — Other Ambulatory Visit: Payer: Self-pay

## 2017-12-21 ENCOUNTER — Other Ambulatory Visit: Payer: Self-pay | Admitting: Internal Medicine

## 2017-12-21 ENCOUNTER — Telehealth: Payer: Self-pay | Admitting: Internal Medicine

## 2017-12-21 LAB — HM DIABETES EYE EXAM

## 2017-12-21 MED ORDER — PEG 3350-KCL-NA BICARB-NACL 420 G PO SOLR: 4000.0000 mL | Freq: Once | ORAL | 0 refills | Status: AC

## 2017-12-21 NOTE — Telephone Encounter (Signed)
Please advise 

## 2017-12-21 NOTE — Telephone Encounter (Signed)
Per chart review with Dr Loletha Carrow. Pt wont pay out of pocket for Plenvu.Rx sent for [facility]. Pt notified and aware, he agrees to give it a try.

## 2017-12-21 NOTE — Telephone Encounter (Signed)
Pt called to f/u on request for prep. He stated that he is very constipated and needs helps. Pls call him.

## 2017-12-24 NOTE — Telephone Encounter (Signed)
Please set aside 2 Plenvu prep samples for him to come and pick up.  He uses these infrequently, but does need to perform colonic purge from time to time. Thanks

## 2017-12-24 NOTE — Telephone Encounter (Signed)
Patient advised samples have been placed at front.

## 2017-12-26 ENCOUNTER — Encounter: Payer: Self-pay | Admitting: Internal Medicine

## 2018-01-11 ENCOUNTER — Other Ambulatory Visit: Payer: Self-pay | Admitting: Internal Medicine

## 2018-01-30 NOTE — Telephone Encounter (Signed)
2 plenvu samples have been placed back in sample closet since patient never came to pick up as of today's date.

## 2018-02-01 ENCOUNTER — Other Ambulatory Visit: Payer: Self-pay | Admitting: Internal Medicine

## 2018-02-18 ENCOUNTER — Telehealth: Payer: Self-pay | Admitting: Internal Medicine

## 2018-02-18 NOTE — Telephone Encounter (Signed)
Spoke with pt and let him know that a sample of Clenpiq has been left up front for him to pickup, pt aware.

## 2018-02-18 NOTE — Telephone Encounter (Signed)
Pt's said that he needs medication that is used to cleanse your colon, he did not remember the name. He stated that Dr. Tonye Royalty told him that if he needed it to just call to give him a sample because his insurance does not cover it.

## 2018-02-22 ENCOUNTER — Other Ambulatory Visit: Payer: Self-pay | Admitting: Endocrinology

## 2018-02-22 ENCOUNTER — Other Ambulatory Visit: Payer: Self-pay | Admitting: Internal Medicine

## 2018-02-27 ENCOUNTER — Other Ambulatory Visit: Payer: Self-pay | Admitting: Internal Medicine

## 2018-03-06 ENCOUNTER — Telehealth: Payer: Self-pay | Admitting: Internal Medicine

## 2018-03-06 MED ORDER — NA SULFATE-K SULFATE-MG SULF 17.5-3.13-1.6 GM/177ML PO SOLN: 1.0000 | Freq: Once | ORAL | 1 refills | Status: AC

## 2018-03-06 NOTE — Telephone Encounter (Signed)
Options are as follows Can call if Suprep or come for sample (refill x 1) or use Miralax  (255g) in 64 oz of gatorade for bowel purge/prep

## 2018-03-06 NOTE — Telephone Encounter (Signed)
Spoke with pt and he states miralax does not work for him. Script sent to pharmacy for suprep and pt aware.

## 2018-03-06 NOTE — Telephone Encounter (Signed)
Pt called on 8/19 requesting bowel prep for constipation, he was given clenpiq. Pt states he does not feel that it "cleaned him out." States he is still having issues with severe constipation. States he had a very "light" BM yesterday but he can feel stool down at the bottom of his abdomen. Please advise.

## 2018-03-07 ENCOUNTER — Other Ambulatory Visit: Payer: Self-pay | Admitting: Internal Medicine

## 2018-03-19 ENCOUNTER — Ambulatory Visit: Payer: Medicare Other | Admitting: Endocrinology

## 2018-03-19 ENCOUNTER — Other Ambulatory Visit (INDEPENDENT_AMBULATORY_CARE_PROVIDER_SITE_OTHER): Payer: Medicare Other

## 2018-03-19 DIAGNOSIS — E119 Type 2 diabetes mellitus without complications: Secondary | ICD-10-CM

## 2018-03-19 LAB — BASIC METABOLIC PANEL
BUN: 27 mg/dL — ABNORMAL HIGH (ref 6–23)
CO2: 18 mEq/L — ABNORMAL LOW (ref 19–32)
CREATININE: 1.58 mg/dL — AB (ref 0.40–1.50)
Calcium: 9.4 mg/dL (ref 8.4–10.5)
Chloride: 111 mEq/L (ref 96–112)
GFR: 55.85 mL/min — ABNORMAL LOW (ref >60.00)
Glucose, Bld: 90 mg/dL (ref 70–99)
Potassium: 4.9 mEq/L (ref 3.5–5.1)
Sodium: 139 mEq/L (ref 135–145)

## 2018-03-19 LAB — HEMOGLOBIN A1C: Hgb A1c MFr Bld: 6.2 % (ref 4.6–6.5)

## 2018-03-19 LAB — MICROALBUMIN / CREATININE URINE RATIO
CREATININE, U: 155.8 mg/dL
Microalb Creat Ratio: 3 mg/g (ref 0.0–30.0)
Microalb, Ur: 4.7 mg/dL — ABNORMAL HIGH (ref 0.0–1.9)

## 2018-03-22 ENCOUNTER — Ambulatory Visit: Payer: Medicare Other | Admitting: Endocrinology

## 2018-03-22 ENCOUNTER — Other Ambulatory Visit: Payer: Medicare Other

## 2018-03-22 ENCOUNTER — Encounter: Payer: Self-pay | Admitting: Endocrinology

## 2018-03-22 VITALS — BP 100/60 | HR 52 | Temp 97.5°F | Ht 68.0 in | Wt 222.0 lb

## 2018-03-22 DIAGNOSIS — Z23 Encounter for immunization: Secondary | ICD-10-CM

## 2018-03-22 DIAGNOSIS — E1165 Type 2 diabetes mellitus with hyperglycemia: Secondary | ICD-10-CM

## 2018-03-22 NOTE — Progress Notes (Signed)
Patient ID: Noah Barnes, male   DOB: Jun 09, 1946, 72 y.o.   MRN: [id number]    Reason for Appointment : F/u for Type 2 Diabetes  History of Present Illness          Diagnosis: Type 2 diabetes mellitus, date of diagnosis: 2008       Past history: His diabetes was borderline in the beginning and not clear what medications he was started on. Has been taking metformin for several years and this was later changed to [place]. Amaryl probably started in 5/14 when blood sugars were higher, initially was given 1 mg and now has been taking 2 mg. He had a relatively high A1c of 7.1, difficulty losing weight and a glucose of 202 on his initial consultation in 06/2013 He was then tried on Invokana in addition to his Janumet and Amaryl Invokana was reduced to 100 mg in early 2015 been creatinine had gone up to 1.6  Recent history:   Oral hypoglycemic drugs the patient is taking are: Amaryl 2 mg at supper, Janumet 50/1000 twice daily   His A1c is stable at 6.2, Previous range 5.8-6.4  Current management, blood sugar patterns and problems identified:  He is checking blood sugars only fasting despite reminders of doing it in the evening  He was having mostly high readings for a few days at the end of last month but blood sugars are more consistently in the low 100 range recently  Overall he has been still trying to be active with going to the [facility] and walking about a mile or so about 3 days a week  Weight is slightly better  Lowest blood sugar in the morning 80 and he has not had any hypoglycemic symptoms during the day  He is usually able to control portions but has had only little weight loss  Side effects from medications have been:  renal dysfunction from Invokana  Glucose monitoring:  done <1 times a day         Glucometer: One Touch   Blood Glucose readings recently from download  AVERAGE 119 to the range 80-195, all fasting readings  Glycemic control:   Lab Results    Component Value Date   HGBA1C 6.2 03/19/2018   HGBA1C 6.2 12/20/2017   HGBA1C 6.0 11/13/2017   Lab Results  Component Value Date   MICROALBUR 4.7 (H) 03/19/2018   LDLCALC 78 12/20/2017   CREATININE 1.58 (H) 03/19/2018    Self-care: Usually has low-fat diet  Exercise:  Recently walking 2-3/7 days, 1.5 miles    Dietician consultations: 08/2013              Weight history:   Wt Readings from Last 3 Encounters:  03/22/18 222 lb (100.7 kg)  12/20/17 225 lb (102.1 kg)  11/16/17 224 lb 6.4 oz (101.8 kg)       Allergies as of 03/22/2018      Reactions   Ace Inhibitors Other (See Comments)   Severe AKI due to ARB + dehydration Aug 2016   Angiotensin Receptor Blockers Other (See Comments)   Severe AKI due to ARB + dehydration Aug 2016   Losartan Other (See Comments)   Severe AKI due to ARB + dehydration Aug 2016      Medication List        Accurate as of 03/22/18 11:59 PM. Always use your most recent med list.          allopurinol 300 MG tablet Commonly known as:  ZYLOPRIM TAKE 1 TABLET BY MOUTH ONCE DAILY   aspirin EC 81 MG tablet Take 81 mg by mouth every morning.   atorvastatin 20 MG tablet Commonly known as:  LIPITOR TAKE 1 TABLET BY MOUTH ONCE DAILY   cloNIDine 0.1 MG tablet Commonly known as:  CATAPRES TAKE 1 TABLET BY MOUTH TWICE DAILY   furosemide 20 MG tablet Commonly known as:  LASIX TAKE 1 TO 2 TABLETS BY MOUTH ONCE DAILY AS NEEDED FOR  FLUID  EDEMA   glimepiride 2 MG tablet Commonly known as:  AMARYL TAKE ONE TABLET BY MOUTH ONCE DAILY BEFORE BREAKFAST   hydrALAZINE 25 MG tablet Commonly known as:  APRESOLINE Take 25 mg by mouth 2 (two) times daily.   linaclotide 290 MCG Caps capsule Commonly known as:  LINZESS Take 1 capsule (290 mcg total) by mouth daily before breakfast.   metoprolol tartrate 100 MG tablet Commonly known as:  LOPRESSOR TAKE 1 TABLET BY MOUTH TWICE DAILY   ONE TOUCH ULTRA TEST test strip Generic drug:  glucose  blood USE TO TEST TWICE DAILY   ONETOUCH DELICA LANCETS FINE Misc USE AS DIRECTED TWICE DAILY   PEG-KCl-NaCl-NaSulf-Na Asc-C 140 g Solr Take 140 g by mouth as directed.   polyethylene glycol packet Commonly known as:  MIRALAX / GLYCOLAX Take 17 g by mouth as needed for mild constipation.   sildenafil 100 MG tablet Commonly known as:  VIAGRA TAKE AS DIRECTED   sitaGLIPtin-metformin 50-1000 MG tablet Commonly known as:  JANUMET TAKE 1 TABLET BY MOUTH TWICE DAILY WITH A MEAL   verapamil 240 MG CR tablet Commonly known as:  CALAN-SR TAKE 1/2 (ONE-HALF) TABLET BY MOUTH TWICE DAILY       Allergies:  Allergies  Allergen Reactions  . Ace Inhibitors Other (See Comments)    Severe AKI due to ARB + dehydration Aug 2016  . Angiotensin Receptor Blockers Other (See Comments)    Severe AKI due to ARB + dehydration Aug 2016  . Losartan Other (See Comments)    Severe AKI due to ARB + dehydration Aug 2016    Past Medical History:  Diagnosis Date  . Chronic diastolic heart failure ([place])   . Constipation   . Diabetes mellitus   . Diverticulosis   . ED (erectile dysfunction)   . Gout   . Hyperlipidemia   . Hypertension   . IBS (irritable bowel syndrome)   . Renal calculus   . Renal insufficiency   . Tubular adenoma of colon     Past Surgical History:  Procedure Laterality Date  . COLONOSCOPY  1998   negative; no F/U  . knee effusion tapped      Family History  Problem Relation Age of Onset  . Heart attack Mother 47  . Hypertension Mother   . Prostate cancer Father 77  . Diabetes Father   . Irritable bowel syndrome Father   . Colon cancer Brother        dx in his late 26's  . Irritable bowel syndrome Sister        x 2  . Irritable bowel syndrome Brother   . Stroke Neg Hx     Social History:  reports that he quit smoking about 26 years ago. His smoking use included cigarettes. He smoked 0.30 packs per day. He has never used smokeless tobacco. He reports that he  drinks about 2.0 standard drinks of alcohol per week. He reports that he does not use drugs.    Review  of Systems   RENAL dysfunction: Creatinine is relatively higher over the last few months   Lab Results  Component Value Date   CREATININE 1.58 (H) 03/19/2018   CREATININE 1.47 12/20/2017   CREATININE 1.32 11/13/2017   CREATININE 1.68 (H) 07/11/2017           Lipids: Has been on Lipitor 20 mg for hyperlipidemia, baseline LDL about 162   Lab Results  Component Value Date   CHOL 132 12/20/2017   HDL 40.80 12/20/2017   LDLCALC 78 12/20/2017   LDLDIRECT 80.1 04/15/2014   TRIG 68.0 12/20/2017   CHOLHDL 3 12/20/2017       The blood pressure has been high for several years and usually well controlled.  Currently taking verapamil 120 mg twice daily, hydralazine, clonidine and metoprolol.   Has not checked blood pressure at home   BP Readings from Last 3 Encounters:  03/22/18 100/60  12/20/17 (!) 98/56  11/16/17 130/80   CONSTIPATION: He thinks he is having more difficulties with this and is due to see the gastroenterologist   LABS:  Lab on 03/19/2018  Component Date Value Ref Range Status  . Microalb, Ur 03/19/2018 4.7* 0.0 - 1.9 mg/dL Final  . Creatinine,U 03/19/2018 155.8  mg/dL Final  . Microalb Creat Ratio 03/19/2018 3.0  0.0 - 30.0 mg/g Final  . Sodium 03/19/2018 139  135 - 145 mEq/L Final  . Potassium 03/19/2018 4.9  3.5 - 5.1 mEq/L Final  . Chloride 03/19/2018 111  96 - 112 mEq/L Final  . CO2 03/19/2018 18* 19 - 32 mEq/L Final  . Glucose, Bld 03/19/2018 90  70 - 99 mg/dL Final  . BUN 03/19/2018 27* 6 - 23 mg/dL Final  . Creatinine, Ser 03/19/2018 1.58* 0.40 - 1.50 mg/dL Final  . Calcium 03/19/2018 9.4  8.4 - 10.5 mg/dL Final  . GFR 03/19/2018 55.85* >60.00 mL/min Final  . Hgb A1c MFr Bld 03/19/2018 6.2  4.6 - 6.5 % Final   Glycemic Control Guidelines for People with Diabetes:Non Diabetic:  <6%Goal of Therapy: <7%Additional Action Suggested:  >8%      Physical Examination:  BP 100/60   Pulse (!) 52   Temp (!) 97.5 F (36.4 C) (Oral)   Ht 5\' 8"  (1.727 m)   Wt 222 lb (100.7 kg)   BMI 33.75 kg/m       ASSESSMENT/PLAN:   Diabetes type 2, uncontrolled with obesity, BMI 34    A1c is excellent at 6.2% and this is again consistent   Currently on regimen of Janumet maximum doses and low-dose Amaryl in the evening Although he is trying to walk more recently has lost only 3 pounds Also does not check readings after meals as directed Meanwhile fasting readings recently are excellent Encouraged him to check more readings after meals to help comply with diet better  Follow-up in 4 months  RENAL dysfunction: This has slightly worsened and this may be because of low normal blood pressure now No microalbuminuria present  HYPERTENSION: Blood pressure is recently relatively low even though he is asymptomatic Considering that he is having significant constipation he needs to taper down and possibly stop verapamil, to reduce it to once a day for now To follow-up with PCP or consultants for follow-up  Influenza vaccine given, high-dose  Patient Instructions  Check blood sugars on waking up  3/7 day  Also check blood sugars about 2 hours after a meal and do this after different meals by rotation  Recommended blood sugar  levels on waking up is 90-130 and about 2 hours after meal is 130-160  Please bring your blood sugar monitor to each visit, thank you  Stop am Verapramil    Elayne Snare 03/24/2018, 9:08 PM

## 2018-03-22 NOTE — Patient Instructions (Addendum)
Check blood sugars on waking up  3/7 day  Also check blood sugars about 2 hours after a meal and do this after different meals by rotation  Recommended blood sugar levels on waking up is 90-130 and about 2 hours after meal is 130-160  Please bring your blood sugar monitor to each visit, thank you  Stop am Verapramil

## 2018-05-14 ENCOUNTER — Ambulatory Visit: Payer: Medicare Other | Admitting: Internal Medicine

## 2018-05-14 ENCOUNTER — Encounter: Payer: Self-pay | Admitting: Internal Medicine

## 2018-05-14 VITALS — BP 96/60 | HR 40 | Ht 66.75 in | Wt 220.5 lb

## 2018-05-14 DIAGNOSIS — K5909 Other constipation: Secondary | ICD-10-CM

## 2018-05-14 DIAGNOSIS — Z86010 Personal history of colonic polyps: Secondary | ICD-10-CM

## 2018-05-14 MED ORDER — LUBIPROSTONE 24 MCG PO CAPS: 24.0000 ug | ORAL_CAPSULE | Freq: Two times a day (BID) | ORAL | 3 refills | Status: DC

## 2018-05-14 NOTE — Progress Notes (Signed)
   Subjective:    Patient ID: Noah Barnes, male    DOB: 09-Jun-1946, 72 y.o.   MRN: [id number]  HPI Noah Barnes is a 72 year old male with a history of IBS with chronic constipation, history of colon polyps and diverticulosis here for follow-up.  Last seen on 10/11/2017.  Here alone today.  He has been maintained on Linzess 290 mcg daily which for the most part works well for him.  He does require bowel purge with colonoscopy prep every 1 to 2 months.  After he does this the Linzess seems to work much better.  He determines that he needs a bowel purge when he develops more constipation with out bowel movement for 1 to 2 days, abdominal bloating, distention and discomfort.  Once bowel purge occurs he feels immediately better and then his laxative seems to work better.  No blood in his stool or melena.  No upper GI or hepatobiliary complaint.  Last colonoscopy was done 03/03/2014 where he had a 6 mm adenoma removed, recall colonoscopy set for September 2020  Review of Systems As per HPI, otherwise negative  Current Medications, Allergies, Past Medical History, Past Surgical History, Family History and Social History were reviewed in [employer] record.     Objective:   Physical Exam BP 96/60 (BP Location: Left Arm, Patient Position: Sitting, Cuff Size: Normal)   Pulse (!) 40   Ht 5' 6.75" (1.695 m)   Wt 220 lb 8 oz (100 kg)   BMI 34.79 kg/m  Gen: awake, alert, NAD HEENT: anicteric, op clear CV: RRR, no mrg Pulm: CTA b/l Abd: soft, obese, NT/ND, +BS throughout, umbilical hernia which is nontender Ext: no c/c/e Neuro: nonfocal  CBC    Component Value Date/Time   WBC 5.1 12/20/2017 0843   RBC 4.68 12/20/2017 0843   HGB 12.6 (L) 12/20/2017 0843   HCT 39.2 12/20/2017 0843   PLT 238.0 12/20/2017 0843   MCV 83.9 12/20/2017 0843   MCH 27.0 03/03/2015 1820   MCHC 32.1 12/20/2017 0843   RDW 15.2 12/20/2017 0843   LYMPHSABS 1.3 12/20/2017 0843   MONOABS 0.4  12/20/2017 0843   EOSABS 0.1 12/20/2017 0843   BASOSABS 0.1 12/20/2017 0843        Assessment & Plan:   72 year old male with a history of IBS with chronic constipation, history of colon polyps and diverticulosis here for follow-up.  1.  Chronic constipation with IBS --I suggested we try him on a different laxative given that despite Linzess he periodically needs bowel purge.  We will try Amitiza 24 mcg twice daily with food.  If this works better without the need for bowel purge that he will continue this therapy.  If not or if less effective he can go back to Linzess 290 mcg daily versus trying Motegrity.   It is okay for him to use Plenvu or Suprep periodically for bowel purge, he was given sample of Plenvu today.  He is using this less than once monthly as it seems to restart more regular bowel movements for him.  He is up-to-date with colonoscopy and there is been no change in symptom.  We can provide him bowel prep samples when available.  2.  History of adenomatous colon polyp --recall colonoscopy September 2020  Return in 6 to 12 months, sooner if needed 15 minutes spent with the patient today. Greater than 50% was spent in counseling and coordination of care with the patient

## 2018-05-14 NOTE — Patient Instructions (Signed)
We have sent the following medications to your pharmacy for you to pick up at your convenience: Amitiza 24 mcg twice daily (in place of Linzess)  If you are age 71 or older, your body mass index should be between 23-30. Your Body mass index is 34.79 kg/m. If this is out of the aforementioned range listed, please consider follow up with your Primary Care Provider.  If you are age 61 or younger, your body mass index should be between 19-25. Your Body mass index is 34.79 kg/m. If this is out of the aformentioned range listed, please consider follow up with your Primary Care Provider.

## 2018-05-22 ENCOUNTER — Ambulatory Visit: Payer: Medicare Other | Admitting: Internal Medicine

## 2018-05-22 ENCOUNTER — Encounter: Payer: Self-pay | Admitting: Internal Medicine

## 2018-05-22 VITALS — BP 128/70 | HR 52 | Temp 97.9°F | Resp 16 | Ht 66.75 in | Wt 221.0 lb

## 2018-05-22 DIAGNOSIS — L03032 Cellulitis of left toe: Secondary | ICD-10-CM

## 2018-05-22 DIAGNOSIS — J302 Other seasonal allergic rhinitis: Secondary | ICD-10-CM

## 2018-05-22 MED ORDER — CEPHALEXIN 500 MG PO CAPS: 500.0000 mg | ORAL_CAPSULE | Freq: Three times a day (TID) | ORAL | 0 refills | Status: DC

## 2018-05-22 NOTE — Assessment & Plan Note (Signed)
Left first toe paronychia He will start soaking his toe today Start Keflex 3 times daily x10 days He will monitor closely and call if there is no improvement

## 2018-05-22 NOTE — Patient Instructions (Addendum)
Medications reviewed and updated.  Changes include :   Keflex antibiotic for your toe.  Continue over the counter cold medications.    Your prescription(s) have been submitted to your pharmacy. Please take as directed and contact our office if you believe you are having problem(s) with the medication(s).  Soak the foot 2-3 times a day.     Call if no improvement     Paronychia Paronychia is an infection of the skin that surrounds a nail. It usually affects the skin around a fingernail, but it may also occur near a toenail. It often causes pain and swelling around the nail. This condition may come on suddenly or develop over a longer period. In some cases, a collection of pus (abscess) can form near or under the nail. Usually, paronychia is not serious and it clears up with treatment. What are the causes? This condition may be caused by bacteria or fungi. It is commonly caused by either Streptococcus or Staphylococcus bacteria. The bacteria or fungi often cause the infection by getting into the affected area through an opening in the skin, such as a cut or a hangnail. What increases the risk? This condition is more likely to develop in:  People who get their hands wet often, such as those who work as Designer, industrial/product, bartenders, or nurses.  People who bite their fingernails or suck their thumbs.  People who trim their nails too short.  People who have hangnails or injured fingertips.  People who get manicures.  People who have diabetes.  What are the signs or symptoms? Symptoms of this condition include:  Redness and swelling of the skin near the nail.  Tenderness around the nail when you touch the area.  Pus-filled bumps under the cuticle. The cuticle is the skin at the base or sides of the nail.  Fluid or pus under the nail.  Throbbing pain in the area.  How is this diagnosed? This condition is usually diagnosed with a physical exam. In some cases, a sample of pus may be  taken from an abscess to be tested in a lab. This can help to determine what type of bacteria or fungi is causing the condition. How is this treated? Treatment for this condition depends on the cause and severity of the condition. If the condition is mild, it may clear up on its own in a few days. Your health care provider may recommend soaking the affected area in warm water a few times a day. When treatment is needed, the options may include:  Antibiotic medicine, if the condition is caused by a bacterial infection.  Antifungal medicine, if the condition is caused by a fungal infection.  Incision and drainage, if an abscess is present. In this procedure, the health care provider will cut open the abscess so the pus can drain out.  Follow these instructions at home:  Soak the affected area in warm water if directed to do so by your health care provider. You may be told to do this for 20 minutes, 2-3 times a day. Keep the area dry in between soakings.  Take medicines only as directed by your health care provider.  If you were prescribed an antibiotic medicine, finish all of it even if you start to feel better.  Keep the affected area clean.  Do not try to drain a fluid-filled bump yourself.  If you will be washing dishes or performing other tasks that require your hands to get wet, wear rubber gloves. You should also  wear gloves if your hands might come in contact with irritating substances, such as cleaners or chemicals.  Follow your health care provider's instructions about: ? Wound care. ? Bandage (dressing) changes and removal. Contact a health care provider if:  Your symptoms get worse or do not improve with treatment.  You have a fever or chills.  You have redness spreading from the affected area.  You have continued or increased fluid, blood, or pus coming from the affected area.  Your finger or knuckle becomes swollen or is difficult to move. This information is not  intended to replace advice given to you by your health care provider. Make sure you discuss any questions you have with your health care provider. Document Released: 12/13/2000 Document Revised: 11/25/2015 Document Reviewed: 05/27/2014 Elsevier Interactive Patient Education  [employer].

## 2018-05-22 NOTE — Progress Notes (Signed)
Subjective:    Patient ID: Noah Barnes, male    DOB: [birth year], 72 y.o.   MRN: [id number]  HPI He is here for an acute visit for cold symptoms.  Cold symptoms: His symptoms started 10 days ago.  He is experiencing chest congestion.  Most years he has similar symptoms that often occur late Dec through Feb.  It is hard to get it out of his system, but the warmer weather helps and respond his symptoms go away.  Continue current has mild nasal congestion, postnasal drip, mild, intermittent sore throat, chest tightness at times, dry cough and mild headaches.  He denies fevers, chills, ear pain, sinus pain, shortness of breath and wheeze.  He has tried taking mucinex which helps a little  Left first toe pain: Skin in the medial edge of his first left toe started being sore about 2 weeks ago after he went and had his nails cut.  He has some swelling, tenderness and redness in this area and it has not improved-he actually thinks has gotten slightly worse.  He denies any open wound or discharge.  He has not had any fevers or chills.  He has not taking anything for it.    Medications and allergies reviewed with patient and updated if appropriate.  Patient Active Problem List   Diagnosis Date Noted  . MR (mitral regurgitation) 12/20/2017  . CKD (chronic kidney disease) stage 3, GFR 30-59 ml/min (HCC) 08/14/2017  . Abdominal hernia without obstruction and without gangrene 01/10/2016  . Chronic diastolic heart failure ([place]) 02/25/2015  . Right renal mass 02/25/2015  . Other constipation 02/22/2015  . Chronic venous insufficiency 01/18/2015  . Superficial phlebitis 01/18/2015  . IBS (irritable bowel syndrome) 11/12/2013  . Family history of prostate cancer 12/26/2011  . Diabetes mellitus with renal complications ([place]) 38/18/2993  . Gout 12/31/2007  . HYPERLIPIDEMIA 08/14/2007  . ERECTILE DYSFUNCTION 05/09/2007  . Abnormal electrocardiogram (ECG) (EKG) 05/09/2007  . Essential hypertension  11/28/2006    Current Outpatient Medications on File Prior to Visit  Medication Sig Dispense Refill  . allopurinol (ZYLOPRIM) 300 MG tablet TAKE 1 TABLET BY MOUTH ONCE DAILY 90 tablet 2  . aspirin EC 81 MG tablet Take 81 mg by mouth every morning.    [name] atorvastatin (LIPITOR) 20 MG tablet TAKE 1 TABLET BY MOUTH ONCE DAILY 90 tablet 3  . cloNIDine (CATAPRES) 0.1 MG tablet TAKE 1 TABLET BY MOUTH TWICE DAILY 180 tablet 2  . furosemide (LASIX) 20 MG tablet TAKE 1 TO 2 TABLETS BY MOUTH ONCE DAILY AS NEEDED FOR  FLUID  EDEMA 90 tablet 3  . glimepiride (AMARYL) 2 MG tablet TAKE ONE TABLET BY MOUTH ONCE DAILY BEFORE BREAKFAST 30 tablet 5  . hydrALAZINE (APRESOLINE) 25 MG tablet Take 25 mg by mouth 2 (two) times daily.    [name] lubiprostone (AMITIZA) 24 MCG capsule Take 1 capsule (24 mcg total) by mouth 2 (two) times daily with a meal. 60 capsule 3  . metoprolol tartrate (LOPRESSOR) 100 MG tablet TAKE 1 TABLET BY MOUTH TWICE DAILY 180 tablet 0  . ONE TOUCH ULTRA TEST test strip USE TO TEST TWICE DAILY 100 each 3  . ONETOUCH DELICA LANCETS FINE MISC USE AS DIRECTED TWICE DAILY 100 each 0  . polyethylene glycol (MIRALAX / GLYCOLAX) packet Take 17 g by mouth as needed for mild constipation.    . sildenafil (VIAGRA) 100 MG tablet TAKE AS DIRECTED 6 tablet 5  . sitaGLIPtin-metformin (JANUMET) 50-1000 MG  tablet TAKE 1 TABLET BY MOUTH TWICE DAILY WITH A MEAL 180 tablet 2  . verapamil (CALAN-SR) 240 MG CR tablet TAKE 1/2 (ONE-HALF) TABLET BY MOUTH TWICE DAILY 90 tablet 1   No current facility-administered medications on file prior to visit.     Past Medical History:  Diagnosis Date  . Chronic diastolic heart failure ([place])   . Constipation   . Diabetes mellitus   . Diverticulosis   . ED (erectile dysfunction)   . Gout   . Hyperlipidemia   . Hypertension   . IBS (irritable bowel syndrome)   . Renal calculus   . Renal insufficiency   . Tubular adenoma of colon     Past Surgical History:  Procedure  Laterality Date  . COLONOSCOPY  1998   negative; no F/U  . knee effusion tapped      Social History   Socioeconomic History  . Marital status: Married    Spouse name: Not on file  . Number of children: 1  . Years of education: Not on file  . Highest education level: Not on file  Occupational History  . Occupation: ..................    Employer: [employer]    Comment: [phone]  Social Needs  . Financial resource strain: Not hard at all  . Food insecurity:    Worry: Never true    Inability: Never true  . Transportation needs:    Medical: Yes    Non-medical: Yes  Tobacco Use  . Smoking status: Former Smoker    Packs/day: 0.30    Types: Cigarettes    Last attempt to quit: 07/04/1991    Years since quitting: 26.9  . Smokeless tobacco: Never Used  . Tobacco comment: smoked age [age], up to 1/3 ppd; may be less  Substance and Sexual Activity  . Alcohol use: Yes    Alcohol/week: 2.0 standard drinks    Types: 2 Glasses of wine per week    Comment: rare  . Drug use: No  . Sexual activity: Never  Lifestyle  . Physical activity:    Days per week: 3 days    Minutes per session: 30 min  . Stress: Not at all  Relationships  . Social connections:    Talks on phone: More than three times a week    Gets together: More than three times a week    Attends religious service: More than 4 times per year    Active member of club or organization: Not on file    Attends meetings of clubs or organizations: More than 4 times per year    Relationship status: Married  Other Topics Concern  . Not on file  Social History Narrative   Exercise: walks 1 mile three times a week    Family History  Problem Relation Age of Onset  . Heart attack Mother 59  . Hypertension Mother   . Prostate cancer Father 33  . Diabetes Father   . Irritable bowel syndrome Father   . Colon cancer Brother        dx in his late 77's  . Irritable bowel syndrome Sister        x 2  . Irritable  bowel syndrome Brother   . Stroke Neg Hx     Review of Systems  Constitutional: Negative for chills and fever.  HENT: Positive for congestion, postnasal drip and sore throat (occ). Negative for ear pain and sinus pain.   Respiratory: Positive for cough (chest congestion) and chest tightness (  at times). Negative for shortness of breath and wheezing.   Gastrointestinal: Negative for diarrhea.  Neurological: Positive for headaches (occ). Negative for light-headedness.       Objective:   Vitals:   05/22/18 0959  BP: 128/70  Pulse: (!) 52  Resp: 16  Temp: 97.9 F (36.6 C)  SpO2: 95%   Filed Weights   05/22/18 0959  Weight: 221 lb (100.2 kg)   Body mass index is 34.87 kg/m.  Wt Readings from Last 3 Encounters:  05/22/18 221 lb (100.2 kg)  05/14/18 220 lb 8 oz (100 kg)  03/22/18 222 lb (100.7 kg)     Physical Exam GENERAL APPEARANCE: Appears stated age, well appearing, NAD EYES: conjunctiva clear, no icterus HEENT: bilateral tympanic membranes and ear canals normal, oropharynx with no erythema, no thyromegaly, trachea midline, no cervical or supraclavicular lymphadenopathy LUNGS: Clear to auscultation without wheeze or crackles, unlabored breathing, good air entry bilaterally CARDIOVASCULAR: Normal S1,S2 without murmurs, no edema SKIN: warm, dry LEFT FOOT: Left first toe-medial distal aspect of the nail is erythematous, slightly swollen and very tender to light touch.  No fluctuance, no open wound or discharge        Assessment & Plan:   See Problem List for Assessment and Plan of chronic medical problems.

## 2018-05-22 NOTE — Assessment & Plan Note (Signed)
His upper respiratory symptoms are consistent with likely seasonal allergies.  He gets this on a yearly basis and it only lasts in the winter No obvious infection Discussed that he may have postnasal drip which is causing some of the chest congestion Discussed allergy medications, Mucinex No need for an antibiotic

## 2018-05-28 ENCOUNTER — Telehealth: Payer: Self-pay | Admitting: Internal Medicine

## 2018-05-28 ENCOUNTER — Other Ambulatory Visit: Payer: Self-pay | Admitting: Endocrinology

## 2018-05-28 ENCOUNTER — Other Ambulatory Visit: Payer: Self-pay | Admitting: Internal Medicine

## 2018-05-28 MED ORDER — LINACLOTIDE 290 MCG PO CAPS: 290.0000 ug | ORAL_CAPSULE | Freq: Every day | ORAL | 6 refills | Status: DC

## 2018-05-28 NOTE — Telephone Encounter (Signed)
Pt called in med is not working will like to change

## 2018-05-28 NOTE — Telephone Encounter (Signed)
Pt states the amitiza is not working well for him, he is requesting script for linzess be sent in for him. Script sent in for linzess 255mcg to pharmacy. Dr. Hilarie Fredrickson aware.

## 2018-06-18 ENCOUNTER — Other Ambulatory Visit (INDEPENDENT_AMBULATORY_CARE_PROVIDER_SITE_OTHER): Payer: Medicare Other

## 2018-06-18 ENCOUNTER — Other Ambulatory Visit: Payer: Medicare Other

## 2018-06-18 DIAGNOSIS — E1165 Type 2 diabetes mellitus with hyperglycemia: Secondary | ICD-10-CM

## 2018-06-18 LAB — BASIC METABOLIC PANEL
BUN: 32 mg/dL — ABNORMAL HIGH (ref 6–23)
CO2: 16 mEq/L — ABNORMAL LOW (ref 19–32)
Calcium: 9.4 mg/dL (ref 8.4–10.5)
Chloride: 113 mEq/L — ABNORMAL HIGH (ref 96–112)
Creatinine, Ser: 1.56 mg/dL — ABNORMAL HIGH (ref 0.40–1.50)
GFR: 56.63 mL/min — ABNORMAL LOW (ref >60.00)
Glucose, Bld: 117 mg/dL — ABNORMAL HIGH (ref 70–99)
Potassium: 5.3 mEq/L — ABNORMAL HIGH (ref 3.5–5.1)
SODIUM: 138 meq/L (ref 135–145)

## 2018-06-18 LAB — HEMOGLOBIN A1C: Hgb A1c MFr Bld: 6 % (ref 4.6–6.5)

## 2018-07-01 ENCOUNTER — Telehealth: Payer: Self-pay

## 2018-07-01 NOTE — Progress Notes (Signed)
Subjective:    Patient ID: Noah Barnes, male    DOB: 01-Oct-1945, 72 y.o.   MRN: [id number]  HPI The patient is here for an acute visit for worsening kidney function.   He has known CKD, stage 3.  His kidney function worsened in September and December compared to this spring-summer.   He takes the lasix as needed.  He takes it once a week on average.  He drinks a lot of water during the day.  On occasion he will take an Advil or Aleve, but not regularly.  He is taking all his medication as prescribed.   Lab Results  Component Value Date   CREATININE 1.56 (H) 06/18/2018   CREATININE 1.58 (H) 03/19/2018   CREATININE 1.47 12/20/2017     He has been having pain in his right lower back.  He thinks it started about 10 days ago.  The pain was severe when it first started and he could hardly walk.  There was no activity that could have caused the pain.  he denies pain into the leg.  Since then it has improved and is now a 3/10.  The pain is constant.  He took a couple of aleve when it was severe, which helped.  He denies a history of kidney stones.  He denies any urinary symptoms.  Medications and allergies reviewed with patient and updated if appropriate.  Patient Active Problem List   Diagnosis Date Noted  . Seasonal allergic rhinitis 05/22/2018  . Paronychia of great toe of left foot 05/22/2018  . MR (mitral regurgitation) 12/20/2017  . CKD (chronic kidney disease) stage 3, GFR 30-59 ml/min (HCC) 08/14/2017  . Abdominal hernia without obstruction and without gangrene 01/10/2016  . Chronic diastolic heart failure ([place]) 02/25/2015  . Right renal mass 02/25/2015  . Other constipation 02/22/2015  . Chronic venous insufficiency 01/18/2015  . Superficial phlebitis 01/18/2015  . IBS (irritable bowel syndrome) 11/12/2013  . Family history of prostate cancer 12/26/2011  . Diabetes mellitus with renal complications ([place]) 34/19/3790  . Gout 12/31/2007  . HYPERLIPIDEMIA 08/14/2007  .  ERECTILE DYSFUNCTION 05/09/2007  . Abnormal electrocardiogram (ECG) (EKG) 05/09/2007  . Essential hypertension 11/28/2006    Current Outpatient Medications on File Prior to Visit  Medication Sig Dispense Refill  . allopurinol (ZYLOPRIM) 300 MG tablet TAKE 1 TABLET BY MOUTH ONCE DAILY 90 tablet 2  . aspirin EC 81 MG tablet Take 81 mg by mouth every morning.    [name] atorvastatin (LIPITOR) 20 MG tablet TAKE 1 TABLET BY MOUTH ONCE DAILY 90 tablet 3  . cloNIDine (CATAPRES) 0.1 MG tablet TAKE 1 TABLET BY MOUTH TWICE DAILY 180 tablet 2  . furosemide (LASIX) 20 MG tablet TAKE 1 TO 2 TABLETS BY MOUTH ONCE DAILY AS NEEDED FOR  FLUID  EDEMA 90 tablet 3  . glimepiride (AMARYL) 2 MG tablet TAKE ONE TABLET BY MOUTH ONCE DAILY BEFORE BREAKFAST 30 tablet 5  . hydrALAZINE (APRESOLINE) 25 MG tablet Take 25 mg by mouth 2 (two) times daily.    [name] linaclotide (LINZESS) 290 MCG CAPS capsule Take 1 capsule (290 mcg total) by mouth daily before breakfast. 30 capsule 6  . lubiprostone (AMITIZA) 24 MCG capsule Take 1 capsule (24 mcg total) by mouth 2 (two) times daily with a meal. 60 capsule 3  . metoprolol tartrate (LOPRESSOR) 100 MG tablet TAKE 1 TABLET BY MOUTH TWICE DAILY 180 tablet 0  . ONE TOUCH ULTRA TEST test strip USE TO TEST TWICE DAILY  100 each 3  . ONETOUCH DELICA LANCETS FINE MISC USE AS DIRECTED TWICE DAILY 100 each 0  . polyethylene glycol (MIRALAX / GLYCOLAX) packet Take 17 g by mouth as needed for mild constipation.    . sildenafil (VIAGRA) 100 MG tablet TAKE AS DIRECTED 6 tablet 5  . sitaGLIPtin-metformin (JANUMET) 50-1000 MG tablet TAKE 1 TABLET BY MOUTH TWICE DAILY WITH MEALS 180 tablet 2  . verapamil (CALAN-SR) 240 MG CR tablet TAKE 1/2 (ONE-HALF) TABLET BY MOUTH TWICE DAILY 90 tablet 1   No current facility-administered medications on file prior to visit.     Past Medical History:  Diagnosis Date  . Chronic diastolic heart failure ([place])   . Constipation   . Diabetes mellitus   .  Diverticulosis   . ED (erectile dysfunction)   . Gout   . Hyperlipidemia   . Hypertension   . IBS (irritable bowel syndrome)   . Renal calculus   . Renal insufficiency   . Tubular adenoma of colon     Past Surgical History:  Procedure Laterality Date  . COLONOSCOPY  1998   negative; no F/U  . knee effusion tapped      Social History   Socioeconomic History  . Marital status: Married    Spouse name: Not on file  . Number of children: 1  . Years of education: Not on file  . Highest education level: Not on file  Occupational History  . Occupation: ..................    Employer: [employer]    Comment: [phone]  Social Needs  . Financial resource strain: Not hard at all  . Food insecurity:    Worry: Never true    Inability: Never true  . Transportation needs:    Medical: Yes    Non-medical: Yes  Tobacco Use  . Smoking status: Former Smoker    Packs/day: 0.30    Types: Cigarettes    Last attempt to quit: 07/04/1991    Years since quitting: 27.0  . Smokeless tobacco: Never Used  . Tobacco comment: smoked age [age], up to 1/3 ppd; may be less  Substance and Sexual Activity  . Alcohol use: Yes    Alcohol/week: 2.0 standard drinks    Types: 2 Glasses of wine per week    Comment: rare  . Drug use: No  . Sexual activity: Never  Lifestyle  . Physical activity:    Days per week: 3 days    Minutes per session: 30 min  . Stress: Not at all  Relationships  . Social connections:    Talks on phone: More than three times a week    Gets together: More than three times a week    Attends religious service: More than 4 times per year    Active member of club or organization: Not on file    Attends meetings of clubs or organizations: More than 4 times per year    Relationship status: Married  Other Topics Concern  . Not on file  Social History Narrative   Exercise: walks 1 mile three times a week    Family History  Problem Relation Age of Onset  . Heart  attack Mother 66  . Hypertension Mother   . Prostate cancer Father 74  . Diabetes Father   . Irritable bowel syndrome Father   . Colon cancer Brother        dx in his late 56's  . Irritable bowel syndrome Sister        x  2  . Irritable bowel syndrome Brother   . Stroke Neg Hx     Review of Systems  Constitutional: Negative for chills and fever.  Gastrointestinal: Positive for constipation (IBS related - no change). Negative for abdominal pain, blood in stool, diarrhea and nausea.  Genitourinary: Positive for flank pain (right side). Negative for dysuria, frequency, hematuria and urgency.       No change in color or odor in urine  Musculoskeletal: Positive for back pain.  Neurological: Negative for dizziness, light-headedness and headaches.       Objective:   Vitals:   07/02/18 0739  BP: 108/62  Pulse: (!) 48  Resp: 16  Temp: 98.1 F (36.7 C)  SpO2: 98%   BP Readings from Last 3 Encounters:  07/02/18 108/62  05/22/18 128/70  05/14/18 96/60   Wt Readings from Last 3 Encounters:  07/02/18 223 lb (101.2 kg)  05/22/18 221 lb (100.2 kg)  05/14/18 220 lb 8 oz (100 kg)   Body mass index is 35.19 kg/m.   Physical Exam    Constitutional: Appears well-developed and well-nourished. No distress.  HENT:  Head: Normocephalic and atraumatic.  Neck: Neck supple. No tracheal deviation present. No thyromegaly present.  No cervical lymphadenopathy Cardiovascular: Normal rate, regular rhythm and normal heart sounds.   1/6 sys murmur heard. No carotid bruit .  Trace pitting b/l LE edema Pulmonary/Chest: Effort normal and breath sounds normal. No respiratory distress. No has no wheezes. No rales.  Abdomen: Soft, nondistended, minimal tenderness right flank Musculoskeletal: Mild tenderness with palpation right lower back to right mid back, no lumbar or thoracic spine tenderness Skin: Skin is warm and dry. Not diaphoretic.  Psychiatric: Normal mood and affect. Behavior is normal.        Assessment & Plan:    See Problem List for Assessment and Plan of chronic medical problems.

## 2018-07-01 NOTE — Telephone Encounter (Signed)
Pt set up for 7:45 tomorrow.

## 2018-07-01 NOTE — Telephone Encounter (Signed)
Copied from [place] [id number]. Topic: Appointment Scheduling - Prior Auth Required for Appointment >> Jul 01, 2018  8:17 AM Judyann Munson wrote: No appointment has been scheduled. Patient is requesting  a appt with Dr.Burns he stated  he was advise he could be worked in.  The patient stated the appt is in regards to his kidney issues. Best contact number is [phone] >> Jul 01, 2018  8:22 AM Morey Hummingbird wrote: Work in tomorrow?

## 2018-07-01 NOTE — Telephone Encounter (Signed)
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 Please schedule pt with next available for Dr. Quay Burow or have him see another provider if he is willing. Thank you.

## 2018-07-01 NOTE — Telephone Encounter (Signed)
Spoke with Almyra Free and we will leave this decision up to Dr. Quay Burow.

## 2018-07-02 ENCOUNTER — Encounter: Payer: Self-pay | Admitting: Internal Medicine

## 2018-07-02 ENCOUNTER — Other Ambulatory Visit (INDEPENDENT_AMBULATORY_CARE_PROVIDER_SITE_OTHER): Payer: Medicare Other

## 2018-07-02 ENCOUNTER — Ambulatory Visit: Payer: Medicare Other | Admitting: Internal Medicine

## 2018-07-02 VITALS — BP 108/62 | HR 48 | Temp 98.1°F | Resp 16 | Ht 66.75 in | Wt 223.0 lb

## 2018-07-02 DIAGNOSIS — N183 Chronic kidney disease, stage 3 unspecified: Secondary | ICD-10-CM

## 2018-07-02 DIAGNOSIS — I10 Essential (primary) hypertension: Secondary | ICD-10-CM | POA: Diagnosis not present

## 2018-07-02 DIAGNOSIS — M545 Low back pain, unspecified: Secondary | ICD-10-CM

## 2018-07-02 DIAGNOSIS — R109 Unspecified abdominal pain: Secondary | ICD-10-CM | POA: Diagnosis not present

## 2018-07-02 LAB — BASIC METABOLIC PANEL
BUN: 25 mg/dL — ABNORMAL HIGH (ref 6–23)
CO2: 19 mEq/L (ref 19–32)
Calcium: 9.2 mg/dL (ref 8.4–10.5)
Chloride: 112 mEq/L (ref 96–112)
Creatinine, Ser: 1.46 mg/dL (ref 0.40–1.50)
GFR: 61.13 mL/min (ref >60.00)
Glucose, Bld: 106 mg/dL — ABNORMAL HIGH (ref 70–99)
Potassium: 4.8 mEq/L (ref 3.5–5.1)
Sodium: 139 mEq/L (ref 135–145)

## 2018-07-02 LAB — URINALYSIS, ROUTINE W REFLEX MICROSCOPIC
Bilirubin Urine: NEGATIVE
Hgb urine dipstick: NEGATIVE
Ketones, ur: NEGATIVE
Leukocytes, UA: NEGATIVE
Nitrite: NEGATIVE
RBC / HPF: NONE SEEN (ref 0–?)
Specific Gravity, Urine: >=1.03 — AB (ref 1.000–1.030)
Urine Glucose: NEGATIVE
Urobilinogen, UA: 0.2 (ref 0.0–1.0)
pH: 5.5 (ref 5.0–8.0)

## 2018-07-02 NOTE — Patient Instructions (Addendum)
  Tests ordered today. Your results will be released to [facility] (or called to you) after review, usually within 72hours after test completion. If any changes need to be made, you will be notified at that same time.   Medications reviewed and updated.  Changes include :   Stop the verapamil.  Monitor your BP at home.   Please followup in 3 months

## 2018-07-03 ENCOUNTER — Encounter: Payer: Self-pay | Admitting: Internal Medicine

## 2018-07-03 DIAGNOSIS — M545 Low back pain, unspecified: Secondary | ICD-10-CM | POA: Insufficient documentation

## 2018-07-03 DIAGNOSIS — R109 Unspecified abdominal pain: Secondary | ICD-10-CM | POA: Insufficient documentation

## 2018-07-03 LAB — URINE CULTURE
MICRO NUMBER:: 91556500
Result:: NO GROWTH
SPECIMEN QUALITY:: ADEQUATE

## 2018-07-03 NOTE — Assessment & Plan Note (Signed)
Chronic kidney disease, stage III GFR has varied over the past year-recent decrease in GFR without obvious cause Takes occasional Advil or Aleve-advised to avoid this whenever possible and only take Tylenol Continue good water intake throughout the day Takes Lasix only as needed-approximately once a week, can continue Stressed low-sodium diet BP on the low side-we will discontinue verapamil Continue all other medications BMP today, urinalysis

## 2018-07-03 NOTE — Assessment & Plan Note (Signed)
Right lower-mid back pain that started 10 days ago without cause-pain radiates around to right flank Possibly musculoskeletal, possible renal stone, less likely UTI given lack of urinary symptoms He is concerned this is related to the decreased kidney function, which I do not think they are related BMP, urinalysis, urine culture Symptomatic treatment with Tylenol, heat Pain has improved and hopefully will continue to improve, if it worsens or does not resolve over the next several days we will pursue imaging

## 2018-07-03 NOTE — Assessment & Plan Note (Signed)
BP Readings from Last 3 Encounters:  07/02/18 108/62  05/22/18 128/70  05/14/18 96/60   Blood pressure has been on the low side-we will discontinue verapamil since he is only taking 120 mg once a day at this point Monitor blood pressure at home Follow-up in 3 months to recheck blood pressure

## 2018-07-03 NOTE — Assessment & Plan Note (Signed)
Right lower-mid back pain and right flank pain Possibly musculoskeletal versus renal stone No urinary symptoms, but could have microscopic hematuria-check urinalysis, culture Pain has improved and hopefully will continue to improve-if it does not we will pursue imaging

## 2018-07-11 ENCOUNTER — Ambulatory Visit: Payer: Medicare Other | Admitting: Internal Medicine

## 2018-07-23 ENCOUNTER — Ambulatory Visit: Payer: Medicare Other | Admitting: Endocrinology

## 2018-07-23 ENCOUNTER — Encounter: Payer: Self-pay | Admitting: Endocrinology

## 2018-07-23 VITALS — BP 104/60 | HR 52 | Ht 66.75 in | Wt 224.4 lb

## 2018-07-23 DIAGNOSIS — E119 Type 2 diabetes mellitus without complications: Secondary | ICD-10-CM

## 2018-07-23 NOTE — Patient Instructions (Signed)
Check blood sugars on waking up 2 days a week  Also check blood sugars about 2 hours after meals and do this after different meals by rotation  Recommended blood sugar levels on waking up are 90-130 and about 2 hours after meal is 130-160  Please bring your blood sugar monitor to each visit, thank you  Restart exercise

## 2018-07-23 NOTE — Progress Notes (Signed)
Patient ID: Noah Barnes, male   DOB: 1945-07-11, 73 y.o.   MRN: [id number]    Reason for Appointment : Follow-up visit  History of Present Illness          Diagnosis: Type 2 diabetes mellitus, date of diagnosis: 2008       Past history: His diabetes was borderline in the beginning and not clear what medications he was started on. Has been taking metformin for several years and this was later changed to [place]. Amaryl probably started in 5/14 when blood sugars were higher, initially was given 1 mg and now has been taking 2 mg. He had a relatively high A1c of 7.1, difficulty losing weight and a glucose of 202 on his initial consultation in 06/2013 He was then tried on Invokana in addition to his Janumet and Amaryl Invokana was reduced to 100 mg in early 2015 been creatinine had gone up to 1.6  Recent history:   Oral hypoglycemic drugs the patient is taking are: Amaryl 2 mg at supper, Janumet 50/1000 twice daily   His A1c is slightly better at 6.0.  Previous range 5.8-6.4  Current management, blood sugar patterns and problems identified:  His medication regimen has been unchanged for a while  Blood sugars are controlled despite his not being very consistent with exercise or possibly diet with no recent weight loss  He is only sporadically checking blood sugars in the morning and these are fairly good with a range of 104 up to 136  No side effects from [place]  Also his renal function is stable for use of Janumet  Side effects from medications have been:  renal dysfunction from Invokana  Glucose monitoring:  done <1 times a day         Glucometer: One Touch   Blood Glucose readings recently from download  AVERAGE 116, previously 119  Glycemic control:   Lab Results  Component Value Date   HGBA1C 6.0 06/18/2018   HGBA1C 6.2 03/19/2018   HGBA1C 6.2 12/20/2017   Lab Results  Component Value Date   MICROALBUR 4.7 (H) 03/19/2018   LDLCALC 78 12/20/2017   CREATININE 1.46 07/02/2018    Self-care: Usually has low-fat diet  Exercise:  Only occasionally walking    Dietician consultations: 08/2013              Weight history:   Wt Readings from Last 3 Encounters:  07/23/18 224 lb 6.4 oz (101.8 kg)  07/02/18 223 lb (101.2 kg)  05/22/18 221 lb (100.2 kg)       Allergies as of 07/23/2018      Reactions   Ace Inhibitors Other (See Comments)   Severe AKI due to ARB + dehydration Aug 2016   Angiotensin Receptor Blockers Other (See Comments)   Severe AKI due to ARB + dehydration Aug 2016   Losartan Other (See Comments)   Severe AKI due to ARB + dehydration Aug 2016      Medication List       Accurate as of July 23, 2018 10:01 AM. Always use your most recent med list.        allopurinol 300 MG tablet Commonly known as:  ZYLOPRIM TAKE 1 TABLET BY MOUTH ONCE DAILY   aspirin EC 81 MG tablet Take 81 mg by mouth every morning.   atorvastatin 20 MG tablet Commonly known as:  LIPITOR TAKE 1 TABLET BY MOUTH ONCE DAILY   cloNIDine 0.1 MG tablet Commonly known as:  CATAPRES TAKE  1 TABLET BY MOUTH TWICE DAILY   furosemide 20 MG tablet Commonly known as:  LASIX TAKE 1 TO 2 TABLETS BY MOUTH ONCE DAILY AS NEEDED FOR  FLUID  EDEMA   glimepiride 2 MG tablet Commonly known as:  AMARYL TAKE ONE TABLET BY MOUTH ONCE DAILY BEFORE BREAKFAST   hydrALAZINE 25 MG tablet Commonly known as:  APRESOLINE Take 25 mg by mouth 2 (two) times daily.   linaclotide 290 MCG Caps capsule Commonly known as:  LINZESS Take 1 capsule (290 mcg total) by mouth daily before breakfast.   lubiprostone 24 MCG capsule Commonly known as:  AMITIZA Take 1 capsule (24 mcg total) by mouth 2 (two) times daily with a meal.   metoprolol tartrate 100 MG tablet Commonly known as:  LOPRESSOR TAKE 1 TABLET BY MOUTH TWICE DAILY   ONE TOUCH ULTRA TEST test strip Generic drug:  glucose blood USE TO TEST TWICE DAILY   ONETOUCH DELICA LANCETS FINE Misc USE AS  DIRECTED TWICE DAILY   polyethylene glycol packet Commonly known as:  MIRALAX / GLYCOLAX Take 17 g by mouth as needed for mild constipation.   sildenafil 100 MG tablet Commonly known as:  VIAGRA TAKE AS DIRECTED   sitaGLIPtin-metformin 50-1000 MG tablet Commonly known as:  JANUMET TAKE 1 TABLET BY MOUTH TWICE DAILY WITH MEALS       Allergies:  Allergies  Allergen Reactions  . Ace Inhibitors Other (See Comments)    Severe AKI due to ARB + dehydration Aug 2016  . Angiotensin Receptor Blockers Other (See Comments)    Severe AKI due to ARB + dehydration Aug 2016  . Losartan Other (See Comments)    Severe AKI due to ARB + dehydration Aug 2016    Past Medical History:  Diagnosis Date  . Chronic diastolic heart failure ([place])   . Constipation   . Diabetes mellitus   . Diverticulosis   . ED (erectile dysfunction)   . Gout   . Hyperlipidemia   . Hypertension   . IBS (irritable bowel syndrome)   . Renal calculus   . Renal insufficiency   . Tubular adenoma of colon     Past Surgical History:  Procedure Laterality Date  . COLONOSCOPY  1998   negative; no F/U  . knee effusion tapped      Family History  Problem Relation Age of Onset  . Heart attack Mother 43  . Hypertension Mother   . Prostate cancer Father 87  . Diabetes Father   . Irritable bowel syndrome Father   . Colon cancer Brother        dx in his late 51's  . Irritable bowel syndrome Sister        x 2  . Irritable bowel syndrome Brother   . Stroke Neg Hx     Social History:  reports that he quit smoking about 27 years ago. His smoking use included cigarettes. He smoked 0.30 packs per day. He has never used smokeless tobacco. He reports current alcohol use of about 2.0 standard drinks of alcohol per week. He reports that he does not use drugs.    Review of Systems   RENAL dysfunction: Creatinine is relatively higher although not showing any consistent worsening Recent potassium is normal   Lab  Results  Component Value Date   CREATININE 1.46 07/02/2018   CREATININE 1.56 (H) 06/18/2018   CREATININE 1.58 (H) 03/19/2018   CREATININE 1.47 12/20/2017     Lab Results  Component Value Date  K 4.8 07/02/2018          Lipids: Has been on Lipitor 20 mg for hyperlipidemia, baseline LDL about 162   Lab Results  Component Value Date   CHOL 132 12/20/2017   HDL 40.80 12/20/2017   LDLCALC 78 12/20/2017   LDLDIRECT 80.1 04/15/2014   TRIG 68.0 12/20/2017   CHOLHDL 3 12/20/2017       The blood pressure has been high for several years and usually well controlled.  Currently taking, hydralazine, clonidine and metoprolol. Despite stopping verapamil his blood pressure is still low normal but no lightheadedness, followed by PCP  Has not checked blood pressure at home   BP Readings from Last 3 Encounters:  07/23/18 104/60  07/02/18 108/62  05/22/18 128/70   CONSTIPATION: Better with stopping verapamil but still needing a laxative   LABS:  No visits with results within 1 Week(s) from this visit.  Latest known visit with results is:  Appointment on 07/02/2018  Component Date Value Ref Range Status  . Color, Urine 07/02/2018 YELLOW  Yellow;Lt. Yellow Final  . APPearance 07/02/2018 CLEAR  Clear Final  . Specific Gravity, Urine 07/02/2018 >=1.030* 1.000 - 1.030 Final  . pH 07/02/2018 5.5  5.0 - 8.0 Final  . Total Protein, Urine 07/02/2018 TRACE* Negative Final  . Urine Glucose 07/02/2018 NEGATIVE  Negative Final  . Ketones, ur 07/02/2018 NEGATIVE  Negative Final  . Bilirubin Urine 07/02/2018 NEGATIVE  Negative Final  . Hgb urine dipstick 07/02/2018 NEGATIVE  Negative Final  . Urobilinogen, UA 07/02/2018 0.2  0.0 - 1.0 Final  . Leukocytes, UA 07/02/2018 NEGATIVE  Negative Final  . Nitrite 07/02/2018 NEGATIVE  Negative Final  . WBC, UA 07/02/2018 0-2/hpf  0-2/hpf Final  . RBC / HPF 07/02/2018 none seen  0-2/hpf Final  . Mucus, UA 07/02/2018 Presence of* None Final  .  Squamous Epithelial / LPF 07/02/2018 Rare(0-4/hpf)  Rare(0-4/hpf) Final  . MICRO NUMBER: 07/02/2018 [id number]   Final  . SPECIMEN QUALITY: 07/02/2018 Adequate   Final  . Sample Source 07/02/2018 NOT GIVEN   Final  . STATUS: 07/02/2018 FINAL   Final  . Result: 07/02/2018 No Growth   Final  . Sodium 07/02/2018 139  135 - 145 mEq/L Final  . Potassium 07/02/2018 4.8  3.5 - 5.1 mEq/L Final  . Chloride 07/02/2018 112  96 - 112 mEq/L Final  . CO2 07/02/2018 19  19 - 32 mEq/L Final  . Glucose, Bld 07/02/2018 106* 70 - 99 mg/dL Final  . BUN 07/02/2018 25* 6 - 23 mg/dL Final  . Creatinine, Ser 07/02/2018 1.46  0.40 - 1.50 mg/dL Final  . Calcium 07/02/2018 9.2  8.4 - 10.5 mg/dL Final  . GFR 07/02/2018 61.13  >60.00 mL/min Final    Physical Examination:  BP 104/60 (BP Location: Left Arm, Patient Position: Sitting, Cuff Size: Normal)   Pulse (!) 52   Ht 5' 6.75" (1.695 m)   Wt 224 lb 6.4 oz (101.8 kg)   SpO2 98%   BMI 35.41 kg/m       ASSESSMENT/PLAN:   Diabetes type 2, with obesity, BMI 35 now  A1c is excellent at 6%  His diabetes has been relatively mild with good control on Janumet twice a day and Amaryl in the evening Blood sugars are near normal without hypoglycemia However checking blood sugars very infrequently and not after meals. Discussed needing to check blood sugars after meals to help him adjust his diet and reduce foods that are raising his blood  sugars with Also does need to exercise and reminded him to start going to the [facility] He can continue the same regimen especially with his renal function being stable  Follow-up in 4 months  RENAL dysfunction: Etiology unclear, not from diabetes He needs to be followed by PCP regularly for this  HYPERTENSION: Blood pressure is again relatively low even though he is asymptomatic Will defer any change in his medications to PCP    There are no Patient Instructions on file for this visit.   Elayne Snare 07/23/2018, 10:01  AM

## 2018-08-06 ENCOUNTER — Other Ambulatory Visit: Payer: Self-pay | Admitting: Endocrinology

## 2018-08-09 ENCOUNTER — Encounter: Payer: Self-pay | Admitting: Cardiology

## 2018-08-12 NOTE — Progress Notes (Addendum)
Subjective:   Noah Barnes is a 73 y.o. male who presents for Medicare Annual/Subsequent preventive examination.  Review of Systems:  No ROS.  Medicare Wellness Visit. Additional risk factors are reflected in the social history. Cardiac Risk Factors include: advanced age (>21men, >38 women);diabetes mellitus;dyslipidemia;male gender;hypertension Sleep patterns: feels rested on waking, gets up 1 times nightly to void and sleeps 6-7 hours nightly.    Home Safety/Smoke Alarms: Feels safe in home. Smoke alarms in place.  Living environment; residence and Firearm Safety: 1-story house/ trailer. Lives with wife, no needs for DME, good support system Seat Belt Safety/Bike Helmet: Wears seat belt.   PSA-  Lab Results  Component Value Date   PSA 0.70 12/18/2016   PSA 0.56 11/13/2012   PSA 0.70 05/09/2007       Objective:    Vitals: BP 122/71   Pulse (!) 57   Resp 17   Ht 5\' 7"  (1.702 m)   Wt 224 lb (101.6 kg)   SpO2 98%   BMI 35.08 kg/m   Body mass index is 35.08 kg/m.  Advanced Directives 08/13/2018 08/08/2017 02/24/2015 12/15/2014  Does Patient Have a Medical Advance Directive? No No No No  Does patient want to make changes to medical advance directive? Yes (ED - Information included in AVS) - - -  Would patient like information on creating a medical advance directive? - No - Patient declined - No - patient declined information    Tobacco Social History   Tobacco Use  Smoking Status Former Smoker  . Packs/day: 0.30  . Types: Cigarettes  . Last attempt to quit: 07/04/1991  . Years since quitting: 27.1  Smokeless Tobacco Never Used  Tobacco Comment   smoked age [age], up to 1/3 ppd; may be less     Counseling given: Not Answered Comment: smoked age [age], up to 1/3 ppd; may be less  Past Medical History:  Diagnosis Date  . Chronic diastolic heart failure ([place])   . Constipation   . Diabetes mellitus   . Diverticulosis   . ED (erectile dysfunction)   . Gout   .  Hyperlipidemia   . Hypertension   . IBS (irritable bowel syndrome)   . Renal calculus   . Renal insufficiency   . Tubular adenoma of colon    Past Surgical History:  Procedure Laterality Date  . COLONOSCOPY  1998   negative; no F/U  . knee effusion tapped     Family History  Problem Relation Age of Onset  . Heart attack Mother 20  . Hypertension Mother   . Prostate cancer Father 21  . Diabetes Father   . Irritable bowel syndrome Father   . Colon cancer Brother        dx in his late 1's  . Irritable bowel syndrome Sister        x 2  . Irritable bowel syndrome Brother   . Stroke Neg Hx    Social History   Socioeconomic History  . Marital status: Married    Spouse name: Not on file  . Number of children: 1  . Years of education: Not on file  . Highest education level: Not on file  Occupational History  . Occupation: Metallurgist: [employer]: [phone]  Social Needs  . Financial resource strain: Not hard at all  . Food insecurity:    Worry: Never true    Inability: Never true  .  Transportation needs:    Medical: Yes    Non-medical: Yes  Tobacco Use  . Smoking status: Former Smoker    Packs/day: 0.30    Types: Cigarettes    Last attempt to quit: 07/04/1991    Years since quitting: 27.1  . Smokeless tobacco: Never Used  . Tobacco comment: smoked age [age], up to 1/3 ppd; may be less  Substance and Sexual Activity  . Alcohol use: Yes    Alcohol/week: 2.0 standard drinks    Types: 2 Glasses of Aidyn Sportsman per week    Comment: rare  . Drug use: No  . Sexual activity: Yes  Lifestyle  . Physical activity:    Days per week: 0 days    Minutes per session: 0 min  . Stress: Not at all  Relationships  . Social connections:    Talks on phone: More than three times a week    Gets together: More than three times a week    Attends religious service: More than 4 times per year    Active member of club or organization: Not on file      Attends meetings of clubs or organizations: More than 4 times per year    Relationship status: Married  Other Topics Concern  . Not on file  Social History Narrative  . Not on file    Outpatient Encounter Medications as of 08/13/2018  Medication Sig  . allopurinol (ZYLOPRIM) 300 MG tablet TAKE 1 TABLET BY MOUTH ONCE DAILY  . aspirin EC 81 MG tablet Take 81 mg by mouth every morning.  [name] atorvastatin (LIPITOR) 20 MG tablet TAKE 1 TABLET BY MOUTH ONCE DAILY  . cloNIDine (CATAPRES) 0.1 MG tablet TAKE 1 TABLET BY MOUTH TWICE DAILY  . furosemide (LASIX) 20 MG tablet TAKE 1 TO 2 TABLETS BY MOUTH ONCE DAILY AS NEEDED FOR  FLUID  EDEMA  . glimepiride (AMARYL) 2 MG tablet TAKE ONE TABLET BY MOUTH ONCE DAILY BEFORE BREAKFAST  . hydrALAZINE (APRESOLINE) 25 MG tablet Take 25 mg by mouth 2 (two) times daily.  [name] linaclotide (LINZESS) 290 MCG CAPS capsule Take 1 capsule (290 mcg total) by mouth daily before breakfast.  . lubiprostone (AMITIZA) 24 MCG capsule Take 1 capsule (24 mcg total) by mouth 2 (two) times daily with a meal.  . metoprolol tartrate (LOPRESSOR) 100 MG tablet TAKE 1 TABLET BY MOUTH TWICE DAILY  . ONE TOUCH ULTRA TEST test strip USE TO TEST TWICE DAILY  . ONETOUCH DELICA LANCETS 48N MISC USE AS DIRECTED TWICE DAILY  . sildenafil (VIAGRA) 100 MG tablet TAKE AS DIRECTED  . sitaGLIPtin-metformin (JANUMET) 50-1000 MG tablet TAKE 1 TABLET BY MOUTH TWICE DAILY WITH MEALS  . [DISCONTINUED] polyethylene glycol (MIRALAX / GLYCOLAX) packet Take 17 g by mouth as needed for mild constipation.   No facility-administered encounter medications on file as of 08/13/2018.     Activities of Daily Living In your present state of health, do you have any difficulty performing the following activities: 08/13/2018  Hearing? N  Vision? N  Difficulty concentrating or making decisions? N  Walking or climbing stairs? N  Dressing or bathing? N  Doing errands, shopping? N  Preparing Food and eating ? N   Using the Toilet? N  In the past six months, have you accidently leaked urine? N  Do you have problems with loss of bowel control? N  Managing your Medications? N  Managing your Finances? N  Housekeeping or managing your Housekeeping? N  Some recent data  might be hidden    Patient Care Team: Binnie Rail, MD as PCP - General (Internal Medicine) Sherren Mocha, MD as PCP - Cardiology (Cardiology)   Assessment:   This is a routine wellness examination for [name]. Physical assessment deferred to PCP.  Exercise Activities and Dietary recommendations Current Exercise Habits: The patient does not participate in regular exercise at present, Exercise limited by: None identified  Diet (meal preparation, eat out, water intake, caffeinated beverages, dairy products, fruits and vegetables): in general, a "healthy" diet  , well balanced  eats a variety of fruits and vegetables daily, limits salt, fat/cholesterol, sugar,carbohydrates,caffeine, drinks 6-8 glasses of water and/or healthy fluid daily .     Goals    . Exercise 3x per week (30 min per time)     Will increase exercise to walking [facility]; Will continue toward weight loss goals    . Patient Stated     Try as much as possible to stay ahead of the constipation. Continue to take the medications to help and follow the instructions of Dr. Hilarie Fredrickson.  Enjoy life, family and grand-daughter.    . Patient Stated     Continue to be physically and socially active.       Fall Risk Fall Risk  08/13/2018 08/08/2017 07/12/2016 12/15/2014 09/02/2014  Falls in the past year? 0 No No No No    Depression Screen PHQ 2/9 Scores 08/08/2017 07/12/2016 12/15/2014 09/02/2014  PHQ - 2 Score 0 0 0 0  PHQ- 9 Score 0 - - -    Cognitive Function MMSE - Mini Mental State Exam 08/08/2017 12/15/2014  Not completed: - Unable to complete  Orientation to time 5 -  Orientation to Place 5 -  Registration 3 -  Attention/ Calculation 5 -   Recall 3 -  Language- name 2 objects 2 -  Language- repeat 1 -  Language- follow 3 step command 3 -  Language- read & follow direction 1 -  Write a sentence 1 -  Copy design 1 -  Total score 30 -       Ad8 score reviewed for issues:  Issues making decisions: no  Less interest in hobbies / activities: no  Repeats questions, stories (family complaining): no  Trouble using ordinary gadgets (microwave, computer, phone):no  Forgets the month or year: no  Mismanaging finances: no  Remembering appts: no  Daily problems with thinking and/or memory: no Ad8 score is= 0  Immunization History  Administered Date(s) Administered  . Influenza, High Dose Seasonal PF 07/12/2016, 04/10/2017, 03/22/2018   Screening Tests Health Maintenance  Topic Date Due  . TETANUS/TDAP  01/03/1966  . PNA vac Low Risk Adult (1 of 2 - PCV13) 08/14/2019 (Originally 01/04/2012)  . HEMOGLOBIN A1C  12/18/2018  . FOOT EXAM  12/21/2018  . OPHTHALMOLOGY EXAM  12/22/2018  . COLONOSCOPY  03/04/2019  . INFLUENZA VACCINE  Completed  . Hepatitis C Screening  Completed       Plan:     Reviewed health maintenance screenings with patient today and relevant education, vaccines, and/or referrals were provided.   Continue doing brain stimulating activities (puzzles, reading, adult coloring books, staying active) to keep memory sharp.   Continue to eat heart healthy diet (full of fruits, vegetables, whole grains, lean protein, water--limit salt, fat, and sugar intake) and increase physical activity as tolerated.  I have personally reviewed and noted the following in the patient's chart:   . Medical and social history .  Use of alcohol, tobacco or illicit drugs  . Current medications and supplements . Functional ability and status . Nutritional status . Physical activity . Advanced directives . List of other physicians . Vitals . Screenings to include cognitive, depression, and falls . Referrals and  appointments  In addition, I have reviewed and discussed with patient certain preventive protocols, quality metrics, and best practice recommendations. A written personalized care plan for preventive services as well as general preventive health recommendations were provided to patient.     Michiel Cowboy, RN  08/13/2018    Medical screening examination/treatment/procedure(s) were performed by non-physician practitioner and as supervising physician I was immediately available for consultation/collaboration. I agree with above. Binnie Rail, MD

## 2018-08-13 ENCOUNTER — Ambulatory Visit (INDEPENDENT_AMBULATORY_CARE_PROVIDER_SITE_OTHER): Payer: Medicare Other | Admitting: *Deleted

## 2018-08-13 VITALS — BP 122/71 | HR 57 | Resp 17 | Ht 67.0 in | Wt 224.0 lb

## 2018-08-13 DIAGNOSIS — Z0000 Encounter for general adult medical examination without abnormal findings: Secondary | ICD-10-CM | POA: Diagnosis not present

## 2018-08-13 NOTE — Patient Instructions (Addendum)
Continue doing brain stimulating activities (puzzles, reading, adult coloring books, staying active) to keep memory sharp.   Continue to eat heart healthy diet (full of fruits, vegetables, whole grains, lean protein, water--limit salt, fat, and sugar intake) and increase physical activity as tolerated.   Noah Barnes , Thank you for taking time to come for your Medicare Wellness Visit. I appreciate your ongoing commitment to your health goals. Please review the following plan we discussed and let me know if I can assist you in the future.   These are the goals we discussed: Goals    . Exercise 3x per week (30 min per time)     Will increase exercise to walking daily [facility]; Will continue toward weight loss goals    . Patient Stated     Try as much as possible to stay ahead of the constipation. Continue to take the medications to help and follow the instructions of Dr. Hilarie Fredrickson.  Enjoy life, family and grand-daughter.    . Patient Stated     Continue to be physically and socially active.       This is a list of the screening recommended for you and due dates:  Health Maintenance  Topic Date Due  . Tetanus Vaccine  01/03/1966  . Pneumonia vaccines (1 of 2 - PCV13) 08/14/2019*  . Hemoglobin A1C  12/18/2018  . Complete foot exam   12/21/2018  . Eye exam for diabetics  12/22/2018  . Colon Cancer Screening  03/04/2019  . Flu Shot  Completed  .  Hepatitis C: One time screening is recommended by Center for Disease Control  (CDC) for  adults born from [birth year] through 1965.   Completed  *Topic was postponed. The date shown is not the original due date.    Preventive Care 6 Years and Older, Male Preventive care refers to lifestyle choices and visits with your health care provider that can promote health and wellness. What does preventive care include?   A yearly physical exam. This is also called an annual well check.  Dental exams once or twice a year.  Routine  eye exams. Ask your health care provider how often you should have your eyes checked.  Personal lifestyle choices, including: ? Daily care of your teeth and gums. ? Regular physical activity. ? Eating a healthy diet. ? Avoiding tobacco and drug use. ? Limiting alcohol use. ? Practicing safe sex. ? Taking low doses of aspirin every day. ? Taking vitamin and mineral supplements as recommended by your health care provider. What happens during an annual well check? The services and screenings done by your health care provider during your annual well check will depend on your age, overall health, lifestyle risk factors, and family history of disease. Counseling Your health care provider may ask you questions about your:  Alcohol use.  Tobacco use.  Drug use.  Emotional well-being.  Home and relationship well-being.  Sexual activity.  Eating habits.  History of falls.  Memory and ability to understand (cognition).  Work and work Statistician. Screening You may have the following tests or measurements:  Height, weight, and BMI.  Blood pressure.  Lipid and cholesterol levels. These may be checked every 5 years, or more frequently if you are over 3 years old.  Skin check.  Lung cancer screening. You may have this screening every year starting at age 63 if you have a 30-pack-year history of smoking and currently smoke or have quit within the past  15 years.  Colorectal cancer screening. All adults should have this screening starting at age 53 and continuing until age 91. You will have tests every 1-10 years, depending on your results and the type of screening test. People at increased risk should start screening at an earlier age. Screening tests may include: ? Guaiac-based fecal occult blood testing. ? Fecal immunochemical test (FIT). ? Stool DNA test. ? Virtual colonoscopy. ? Sigmoidoscopy. During this test, a flexible tube with a tiny camera (sigmoidoscope) is used to  examine your rectum and lower colon. The sigmoidoscope is inserted through your anus into your rectum and lower colon. ? Colonoscopy. During this test, a long, thin, flexible tube with a tiny camera (colonoscope) is used to examine your entire colon and rectum.  Prostate cancer screening. Recommendations will vary depending on your family history and other risks.  Hepatitis C blood test.  Hepatitis B blood test.  Sexually transmitted disease (STD) testing.  Diabetes screening. This is done by checking your blood sugar (glucose) after you have not eaten for a while (fasting). You may have this done every 1-3 years.  Abdominal aortic aneurysm (AAA) screening. You may need this if you are a current or former smoker.  Osteoporosis. You may be screened starting at age 27 if you are at high risk. Talk with your health care provider about your test results, treatment options, and if necessary, the need for more tests. Vaccines Your health care provider may recommend certain vaccines, such as:  Influenza vaccine. This is recommended every year.  Tetanus, diphtheria, and acellular pertussis (Tdap, Td) vaccine. You may need a Td booster every 10 years.  Varicella vaccine. You may need this if you have not been vaccinated.  Zoster vaccine. You may need this after age 75.  Measles, mumps, and rubella (MMR) vaccine. You may need at least one dose of MMR if you were born in 1957 or later. You may also need a second dose.  Pneumococcal 13-valent conjugate (PCV13) vaccine. One dose is recommended after age 35.  Pneumococcal polysaccharide (PPSV23) vaccine. One dose is recommended after age 59.  Meningococcal vaccine. You may need this if you have certain conditions.  Hepatitis A vaccine. You may need this if you have certain conditions or if you travel or work in places where you may be exposed to hepatitis A.  Hepatitis B vaccine. You may need this if you have certain conditions or if you  travel or work in places where you may be exposed to hepatitis B.  Haemophilus influenzae type b (Hib) vaccine. You may need this if you have certain risk factors. Talk to your health care provider about which screenings and vaccines you need and how often you need them. This information is not intended to replace advice given to you by your health care provider. Make sure you discuss any questions you have with your health care provider. Document Released: 07/16/2015 Document Revised: 08/09/2017 Document Reviewed: 04/20/2015 Elsevier Interactive Patient Education  2019 [employer].

## 2018-08-28 ENCOUNTER — Ambulatory Visit: Payer: Medicare Other | Admitting: Cardiovascular Disease

## 2018-08-28 ENCOUNTER — Encounter: Payer: Self-pay | Admitting: Cardiovascular Disease

## 2018-08-28 VITALS — BP 130/78 | HR 47 | Ht 67.0 in | Wt 225.8 lb

## 2018-08-28 DIAGNOSIS — N183 Chronic kidney disease, stage 3 unspecified: Secondary | ICD-10-CM

## 2018-08-28 DIAGNOSIS — I10 Essential (primary) hypertension: Secondary | ICD-10-CM | POA: Diagnosis not present

## 2018-08-28 DIAGNOSIS — I5032 Chronic diastolic (congestive) heart failure: Secondary | ICD-10-CM | POA: Diagnosis not present

## 2018-08-28 DIAGNOSIS — I252 Old myocardial infarction: Secondary | ICD-10-CM | POA: Diagnosis not present

## 2018-08-28 NOTE — Progress Notes (Signed)
Cardiology Office Note:    Date:  08/28/2018   ID:  Noah Barnes, DOB 05-21-1946, MRN [id number]  PCP:  Binnie Rail, MD  Cardiologist:  Sherren Mocha, MD  Electrophysiologist:  None   Referring MD: Binnie Rail, MD   Chief Complaint  Patient presents with  . Shortness of Breath    History of Present Illness:    Noah Barnes is a 73 y.o. male with a hx of abnormal EKG, chronic diastolic heart failure, hypertension, hyperlipidemia, type 2 diabetes, and chronic kidney disease.  He underwent evaluation back in 2016 when he was hospitalized with acute renal failure.  His EKG had evidence of an age-indeterminate inferior infarct.  Echocardiogram showed normal LV function with inferior hypokinesis and a nuclear stress test demonstrated a fixed inferior wall defect without significant ischemia.  He has remained symptomatic without any symptoms of angina.  The patient is here alone today for routine follow-up evaluation.  He feels that he is doing well.  He denies any chest pain or chest pressure.  He has had no lightheadedness, heart palpitations, leg swelling, orthopnea, or PND.  He has mild exertional dyspnea that he attributes to deconditioning.  This is unchanged over time.  Past Medical History:  Diagnosis Date  . Chronic diastolic heart failure ([place])   . Constipation   . Diabetes mellitus   . Diverticulosis   . ED (erectile dysfunction)   . Gout   . Hyperlipidemia   . Hypertension   . IBS (irritable bowel syndrome)   . Renal calculus   . Renal insufficiency   . Tubular adenoma of colon     Past Surgical History:  Procedure Laterality Date  . COLONOSCOPY  1998   negative; no F/U  . knee effusion tapped      Current Medications: Current Meds  Medication Sig  . allopurinol (ZYLOPRIM) 300 MG tablet TAKE 1 TABLET BY MOUTH ONCE DAILY  . aspirin EC 81 MG tablet Take 81 mg by mouth every morning.  [name] atorvastatin (LIPITOR) 20 MG tablet TAKE 1 TABLET BY MOUTH ONCE  DAILY  . cloNIDine (CATAPRES) 0.1 MG tablet TAKE 1 TABLET BY MOUTH TWICE DAILY  . furosemide (LASIX) 20 MG tablet TAKE 1 TO 2 TABLETS BY MOUTH ONCE DAILY AS NEEDED FOR  FLUID  EDEMA  . glimepiride (AMARYL) 2 MG tablet TAKE ONE TABLET BY MOUTH ONCE DAILY BEFORE BREAKFAST  . hydrALAZINE (APRESOLINE) 25 MG tablet Take 25 mg by mouth 2 (two) times daily.  [name] linaclotide (LINZESS) 290 MCG CAPS capsule Take 1 capsule (290 mcg total) by mouth daily before breakfast.  . metoprolol tartrate (LOPRESSOR) 100 MG tablet TAKE 1 TABLET BY MOUTH TWICE DAILY  . ONE TOUCH ULTRA TEST test strip USE TO TEST TWICE DAILY  . ONETOUCH DELICA LANCETS 51Z MISC USE AS DIRECTED TWICE DAILY  . sildenafil (VIAGRA) 100 MG tablet TAKE AS DIRECTED  . sitaGLIPtin-metformin (JANUMET) 50-1000 MG tablet TAKE 1 TABLET BY MOUTH TWICE DAILY WITH MEALS     Allergies:   Ace inhibitors; Angiotensin receptor blockers; and Losartan   Social History   Socioeconomic History  . Marital status: Married    Spouse name: Not on file  . Number of children: 1  . Years of education: Not on file  . Highest education level: Not on file  Occupational History  . Occupation: Metallurgist: [employer]: [phone]  Social Needs  .  Financial resource strain: Not hard at all  . Food insecurity:    Worry: Never true    Inability: Never true  . Transportation needs:    Medical: Yes    Non-medical: Yes  Tobacco Use  . Smoking status: Former Smoker    Packs/day: 0.30    Types: Cigarettes    Last attempt to quit: 07/04/1991    Years since quitting: 27.1  . Smokeless tobacco: Never Used  . Tobacco comment: smoked age [age], up to 1/3 ppd; may be less  Substance and Sexual Activity  . Alcohol use: Yes    Alcohol/week: 2.0 standard drinks    Types: 2 Glasses of wine per week    Comment: rare  . Drug use: No  . Sexual activity: Yes  Lifestyle  . Physical activity:    Days per week: 0 days     Minutes per session: 0 min  . Stress: Not at all  Relationships  . Social connections:    Talks on phone: More than three times a week    Gets together: More than three times a week    Attends religious service: More than 4 times per year    Active member of club or organization: Not on file    Attends meetings of clubs or organizations: More than 4 times per year    Relationship status: Married  Other Topics Concern  . Not on file  Social History Narrative  . Not on file     Family History: The patient's family history includes Colon cancer in his brother; Diabetes in his father; Heart attack (age of onset: 69) in his mother; Hypertension in his mother; Irritable bowel syndrome in his brother, father, and sister; Prostate cancer (age of onset: 24) in his father. There is no history of Stroke.  ROS:   Please see the history of present illness.    Positive for abdominal pain, snoring, constipation.  All other systems reviewed and are negative.  EKGs/Labs/Other Studies Reviewed:    The following studies were reviewed today: Nuclear stress test 11/04/2015: Study Highlights    The left ventricular ejection fraction is mildly decreased (45-54%).  Nuclear stress EF: 46%.  There was no ST segment deviation noted during stress.  Defect 1: There is a medium defect of moderate severity present in the basal inferior, basal inferolateral, mid inferior, apical inferior and apex location.  This is an intermediate risk study.     Echo 03/04/2015: Study Conclusions  - Left ventricle: The cavity size was normal. Systolic function was   normal. The estimated ejection fraction was in the range of 60%   to 65%. There is hypokinesis of the basal-midinferior myocardium.   Doppler parameters are consistent with abnormal left ventricular   relaxation (grade 1 diastolic dysfunction). - Aortic valve: Valve mobility was restricted. - Mitral valve: There was mild regurgitation. - Tricuspid valve:  There was mild regurgitation. - Pulmonary arteries: Systolic pressure was mildly increased. PA   peak pressure: 36 mm Hg (S).  Impressions:  - No significant change from prior echocardiogram.  EKG:  EKG is ordered today.  The ekg ordered today demonstrates sinus bradycardia 47 bpm, T wave abnormality consider inferior ischemia.  No significant change from previous tracings.  Recent Labs: 12/20/2017: ALT 29; Hemoglobin 12.6; Platelets 238.0; TSH 2.31 07/02/2018: BUN 25; Creatinine, Ser 1.46; Potassium 4.8; Sodium 139  Recent Lipid Panel    Component Value Date/Time   CHOL 132 12/20/2017 0843   TRIG 68.0 12/20/2017  0843   HDL 40.80 12/20/2017 0843   CHOLHDL 3 12/20/2017 0843   VLDL 13.6 12/20/2017 0843   LDLCALC 78 12/20/2017 0843   LDLDIRECT 80.1 04/15/2014 0953    Physical Exam:    VS:  BP 130/78   Pulse (!) 47   Ht 5\' 7"  (1.702 m)   Wt 225 lb 12.8 oz (102.4 kg)   SpO2 98%   BMI 35.37 kg/m     Wt Readings from Last 3 Encounters:  08/28/18 225 lb 12.8 oz (102.4 kg)  08/13/18 224 lb (101.6 kg)  07/23/18 224 lb 6.4 oz (101.8 kg)     GEN: Pleasant overweight male in no distress HEENT: Normal NECK: No JVD; No carotid bruits LYMPHATICS: No lymphadenopathy CARDIAC: Bradycardic and regular, no murmurs, rubs, gallops RESPIRATORY:  Clear to auscultation without rales, wheezing or rhonchi  ABDOMEN: Soft, non-tender, non-distended MUSCULOSKELETAL:  No edema; No deformity  SKIN: Warm and dry NEUROLOGIC:  Alert and oriented x 3 PSYCHIATRIC:  Normal affect   ASSESSMENT:    1. Chronic diastolic heart failure ([place])   2. Essential hypertension   3. CKD (chronic kidney disease) stage 3, GFR 30-59 ml/min (HCC)   4. Old MI (myocardial infarction)    PLAN:    In order of problems listed above:  1. The patient appears euvolemic on exam without symptoms of heart failure.  Medical program is reviewed and will be continued.  He has been unable to tolerate ACE or ARB secondary  to history of severe acute kidney injury on these agents. 2. Blood pressure is well controlled on clonidine, hydralazine, and metoprolol 3. Most recent labs reviewed and demonstrate stable renal function with creatinine of 1.46 mg/dL.  He is followed closely by his primary physician. 4. With EKG findings as well as previous findings of echo and nuclear stress test studies, I think it is likely the patient has had an old inferior MI that was undetected.  He understands that with an absence of symptoms and a low risk stress nuclear study as he has had in the past, ongoing medical therapy is indicated.  Medication Adjustments/Labs and Tests Ordered: Current medicines are reviewed at length with the patient today.  Concerns regarding medicines are outlined above.  Orders Placed This Encounter  Procedures  . EKG 12-Lead   No orders of the defined types were placed in this encounter.   Patient Instructions  Medication Instructions:  Your provider recommends that you continue on your current medications as directed. Please refer to the Current Medication list given to you today.   If you need a refill on your cardiac medications before your next appointment, please call your pharmacy.   Follow-Up: At [facility], you and your health needs are our priority.  As part of our continuing mission to provide you with exceptional heart care, we have created designated Provider Care Teams.  These Care Teams include your primary Cardiologist (physician) and Advanced Practice Providers (APPs -  Physician Assistants and Nurse Practitioners) who all work together to provide you with the care you need, when you need it. You will need a follow up appointment in:  12 months.  Please call our office 2 months in advance to schedule this appointment.  You may see Sherren Mocha, MD or one of the following Advanced Practice Providers on your designated Care Team: Richardson Dopp, PA-C [place], [place] . Daune Perch,  NP    Signed, Sherren Mocha, MD  08/28/2018 12:42 PM    [facility]

## 2018-08-28 NOTE — Patient Instructions (Signed)
Medication Instructions:  Your provider recommends that you continue on your current medications as directed. Please refer to the Current Medication list given to you today.   If you need a refill on your cardiac medications before your next appointment, please call your pharmacy.    Follow-Up: At [facility], you and your health needs are our priority.  As part of our continuing mission to provide you with exceptional heart care, we have created designated Provider Care Teams.  These Care Teams include your primary Cardiologist (physician) and Advanced Practice Providers (APPs -  Physician Assistants and Nurse Practitioners) who all work together to provide you with the care you need, when you need it. You will need a follow up appointment in:  12 months.  Please call our office 2 months in advance to schedule this appointment.  You may see Michael Cooper, MD or one of the following Advanced Practice Providers on your designated Care Team: Scott Weaver, PA-C Vin Bhagat, PA-C . Janine Hammond, NP      

## 2018-09-01 ENCOUNTER — Other Ambulatory Visit: Payer: Self-pay | Admitting: Endocrinology

## 2018-09-18 ENCOUNTER — Telehealth: Payer: Self-pay | Admitting: Endocrinology

## 2018-09-18 ENCOUNTER — Telehealth: Payer: Self-pay | Admitting: Internal Medicine

## 2018-09-18 NOTE — Telephone Encounter (Signed)
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   Please advise

## 2018-09-18 NOTE — Telephone Encounter (Signed)
Copied from [place] [id number]. Topic: General - Other >> Sep 18, 2018  2:16 PM Leward Quan A wrote: Reason for CRM: Patient called to say that he is employed with the [employer] system and was informed by his employer that since he is Diabetic and over the age of 55 he need to get an excuse letter from his PCP to excuse him from work. Ph# [phone]   Please advise, Thank you.

## 2018-09-18 NOTE — Telephone Encounter (Signed)
To be done by PCP

## 2018-09-18 NOTE — Telephone Encounter (Signed)
Called pt and gave him MD message. Pt verbalized understanding. 

## 2018-09-18 NOTE — Telephone Encounter (Signed)
Do you want this to come from PCP?

## 2018-09-19 NOTE — Telephone Encounter (Signed)
Note done and set up front for pt to pick up. Pt is aware.

## 2018-09-19 NOTE — Telephone Encounter (Signed)
Pt needs note to give to [place] to stay out of work. He is 28 and has diabetes. Please advise / Pt needs note asap

## 2018-09-20 ENCOUNTER — Other Ambulatory Visit: Payer: Self-pay | Admitting: Internal Medicine

## 2018-09-29 NOTE — Progress Notes (Signed)
Virtual Visit via Video Note  I connected with Noah Barnes on 09/29/18 at 11:00 AM EDT by a video enabled telemedicine application and verified that I am speaking with the correct person using two identifiers.   I discussed the limitations of evaluation and management by telemedicine and the availability of in person appointments. The patient expressed understanding and agreed to proceed.  The patient is currently at home and I am in the office.    No referring provider.    History of Present Illness: This visit is for follow up of his chronic medical conditions.        Observations/Objective:  Lab Results  Component Value Date   WBC 5.1 12/20/2017   HGB 12.6 (L) 12/20/2017   HCT 39.2 12/20/2017   PLT 238.0 12/20/2017   GLUCOSE 106 (H) 07/02/2018   CHOL 132 12/20/2017   TRIG 68.0 12/20/2017   HDL 40.80 12/20/2017   LDLDIRECT 80.1 04/15/2014   LDLCALC 78 12/20/2017   ALT 29 12/20/2017   AST 13 12/20/2017   NA 139 07/02/2018   K 4.8 07/02/2018   CL 112 07/02/2018   CREATININE 1.46 07/02/2018   BUN 25 (H) 07/02/2018   CO2 19 07/02/2018   TSH 2.31 12/20/2017   PSA 0.70 12/18/2016   HGBA1C 6.0 06/18/2018   MICROALBUR 4.7 (H) 03/19/2018   Assessment and Plan:  See Problem List for Assessment and Plan of chronic medical problems.   Follow Up Instructions:    I discussed the assessment and treatment plan with the patient. The patient was provided an opportunity to ask questions and all were answered. The patient agreed with the plan and demonstrated an understanding of the instructions.   The patient was advised to call back or seek an in-person evaluation if the symptoms worsen or if the condition fails to improve as anticipated.      Binnie Rail, MD   This encounter was created in error - please disregard.

## 2018-09-30 ENCOUNTER — Other Ambulatory Visit: Payer: Self-pay | Admitting: Internal Medicine

## 2018-10-01 ENCOUNTER — Encounter: Payer: Medicare Other | Admitting: Internal Medicine

## 2018-11-05 ENCOUNTER — Other Ambulatory Visit: Payer: Self-pay | Admitting: Internal Medicine

## 2018-11-19 ENCOUNTER — Other Ambulatory Visit: Payer: Self-pay

## 2018-11-19 ENCOUNTER — Other Ambulatory Visit (INDEPENDENT_AMBULATORY_CARE_PROVIDER_SITE_OTHER): Payer: Medicare Other

## 2018-11-19 DIAGNOSIS — E119 Type 2 diabetes mellitus without complications: Secondary | ICD-10-CM | POA: Diagnosis not present

## 2018-11-19 LAB — COMPREHENSIVE METABOLIC PANEL
ALT: 25 U/L (ref 0–53)
AST: 16 U/L (ref 0–37)
Albumin: 3.9 g/dL (ref 3.5–5.2)
Alkaline Phosphatase: 67 U/L (ref 39–117)
BUN: 29 mg/dL — ABNORMAL HIGH (ref 6–23)
CO2: 18 mEq/L — ABNORMAL LOW (ref 19–32)
Calcium: 9.2 mg/dL (ref 8.4–10.5)
Chloride: 113 mEq/L — ABNORMAL HIGH (ref 96–112)
Creatinine, Ser: 1.63 mg/dL — ABNORMAL HIGH (ref 0.40–1.50)
GFR: 50.59 mL/min — ABNORMAL LOW (ref >60.00)
Glucose, Bld: 108 mg/dL — ABNORMAL HIGH (ref 70–99)
Potassium: 4.7 mEq/L (ref 3.5–5.1)
Sodium: 137 mEq/L (ref 135–145)
Total Bilirubin: 0.5 mg/dL (ref 0.2–1.2)
Total Protein: 6.7 g/dL (ref 6.0–8.3)

## 2018-11-19 LAB — HEMOGLOBIN A1C: Hgb A1c MFr Bld: 6.5 % (ref 4.6–6.5)

## 2018-11-19 NOTE — Progress Notes (Signed)
Patient ID: Noah Barnes, male   DOB: 11-13-1945, 73 y.o.   MRN: [id number]   Today's office visit was provided via telemedicine using audio technique Explained to the patient and the the limitations of evaluation and management by telemedicine and the availability of in person appointments.  The patient understood the limitations and agreed to proceed. Patient also understood that the telehealth visit is billable. . Location of the patient: Home . Location of the provider: Office Only the patient and myself were participating in the encounter  Duration of phone encounter =7 minutes   Reason for Appointment : Follow-up visit  History of Present Illness          Diagnosis: Type 2 diabetes mellitus, date of diagnosis: 2008       Past history: His diabetes was borderline in the beginning and not clear what medications he was started on. Has been taking metformin for several years and this was later changed to Kell. Amaryl probably started in 5/14 when blood sugars were higher, initially was given 1 mg and now has been taking 2 mg. He had a relatively high A1c of 7.1, difficulty losing weight and a glucose of 202 on his initial consultation in 06/2013 He was then tried on Invokana in addition to his Janumet and Amaryl Invokana was reduced to 100 mg in early 2015 been creatinine had gone up to 1.6  Recent history:   Oral hypoglycemic drugs the patient is taking are: Amaryl 2 mg at supper, Janumet 50/1000 twice daily   He is being seen after his last visit of 07/2018  His A1c is slightly higher at 6.5, previously at 6.0.  Previous range 5.8-6.4  Current management, blood sugar patterns and problems identified:  His blood sugar monitoring appears to be still infrequent and mostly doing it in the morning  This is despite reminders of checking readings after meals  He said that he has not been watching his meals and snacks because of the pandemic and may have had higher  sugars because of this  He has not been making any significant attempts to exercise with walking weekly with the gym being closed since last month  No hypoglycemia with taking Amaryl  She thinks her weight is about the same but has not checked it  Currently taking Janumet twice a day and his creatinine clearance is borderline  Side effects from medications have been:  renal dysfunction from Invokana  Glucose monitoring:  done <1 times a day         Glucometer: One Touch   Blood Glucose readings recently from patient recalling his numbers as below  Sugar range 94-136 over the last week or so    Glycemic control:   Lab Results  Component Value Date   HGBA1C 6.5 11/19/2018   HGBA1C 6.0 06/18/2018   HGBA1C 6.2 03/19/2018   Lab Results  Component Value Date   MICROALBUR 4.7 (H) 03/19/2018   LDLCALC 78 12/20/2017   CREATININE 1.63 (H) 11/19/2018    Self-care: Usually has low-fat diet  Exercise:  Only occasionally walking    Dietician consultations: 08/2013              Weight history:   Wt Readings from Last 3 Encounters:  08/28/18 225 lb 12.8 oz (102.4 kg)  08/13/18 224 lb (101.6 kg)  07/23/18 224 lb 6.4 oz (101.8 kg)       Allergies as of 11/20/2018      Reactions   Ace  Inhibitors Other (See Comments)   Severe AKI due to ARB + dehydration Aug 2016   Angiotensin Receptor Blockers Other (See Comments)   Severe AKI due to ARB + dehydration Aug 2016   Losartan Other (See Comments)   Severe AKI due to ARB + dehydration Aug 2016      Medication List       Accurate as of Nov 19, 2018  9:04 PM. If you have any questions, ask your nurse or doctor.        allopurinol 300 MG tablet Commonly known as:  ZYLOPRIM Take 1 tablet by mouth once daily   aspirin EC 81 MG tablet Take 81 mg by mouth every morning.   atorvastatin 20 MG tablet Commonly known as:  LIPITOR TAKE 1 TABLET BY MOUTH ONCE DAILY   cloNIDine 0.1 MG tablet Commonly known as:  CATAPRES TAKE 1  TABLET BY MOUTH TWICE DAILY   furosemide 20 MG tablet Commonly known as:  LASIX TAKE 1 TO 2 TABLETS BY MOUTH ONCE DAILY AS NEEDED FOR  FLUID  EDEMA   glimepiride 2 MG tablet Commonly known as:  AMARYL TAKE 1 TABLET BY MOUTH ONCE DAILY BEFORE BREAKFAST   hydrALAZINE 25 MG tablet Commonly known as:  APRESOLINE Take 25 mg by mouth 2 (two) times daily.   linaclotide 290 MCG Caps capsule Commonly known as:  LINZESS Take 1 capsule (290 mcg total) by mouth daily before breakfast.   metoprolol tartrate 100 MG tablet Commonly known as:  LOPRESSOR Take 1 tablet by mouth twice daily   ONE TOUCH ULTRA TEST test strip Generic drug:  glucose blood USE TO TEST TWICE DAILY   OneTouch Delica Lancets 46E Misc USE AS DIRECTED TWICE DAILY   sildenafil 100 MG tablet Commonly known as:  VIAGRA take as directed -- Office visit needed for further refills   sitaGLIPtin-metformin 50-1000 MG tablet Commonly known as:  Janumet TAKE 1 TABLET BY MOUTH TWICE DAILY WITH MEALS       Allergies:  Allergies  Allergen Reactions  . Ace Inhibitors Other (See Comments)    Severe AKI due to ARB + dehydration Aug 2016  . Angiotensin Receptor Blockers Other (See Comments)    Severe AKI due to ARB + dehydration Aug 2016  . Losartan Other (See Comments)    Severe AKI due to ARB + dehydration Aug 2016    Past Medical History:  Diagnosis Date  . Chronic diastolic heart failure ([place])   . Constipation   . Diabetes mellitus   . Diverticulosis   . ED (erectile dysfunction)   . Gout   . Hyperlipidemia   . Hypertension   . IBS (irritable bowel syndrome)   . Renal calculus   . Renal insufficiency   . Tubular adenoma of colon     Past Surgical History:  Procedure Laterality Date  . COLONOSCOPY  1998   negative; no F/U  . knee effusion tapped      Family History  Problem Relation Age of Onset  . Heart attack Mother 66  . Hypertension Mother   . Prostate cancer Father 40  . Diabetes Father   .  Irritable bowel syndrome Father   . Colon cancer Brother        dx in his late 32's  . Irritable bowel syndrome Sister        x 2  . Irritable bowel syndrome Brother   . Stroke Neg Hx     Social History:  reports that he  quit smoking about 27 years ago. His smoking use included cigarettes. He smoked 0.30 packs per day. He has never used smokeless tobacco. He reports current alcohol use of about 2.0 standard drinks of alcohol per week. He reports that he does not use drugs.    Review of Systems   RENAL dysfunction: Creatinine is relatively higher again Etiology unclear but may be nephrosclerosis from hypertension   Lab Results  Component Value Date   CREATININE 1.63 (H) 11/19/2018   CREATININE 1.46 07/02/2018   CREATININE 1.56 (H) 06/18/2018   CREATININE 1.58 (H) 03/19/2018     Lab Results  Component Value Date   K 4.7 11/19/2018          Lipids: Has been on Lipitor 20 mg for hyperlipidemia, baseline LDL about 162 Followed by Dr. Quay Burow  Lab Results  Component Value Date   CHOL 132 12/20/2017   HDL 40.80 12/20/2017   LDLCALC 78 12/20/2017   LDLDIRECT 80.1 04/15/2014   TRIG 68.0 12/20/2017   CHOLHDL 3 12/20/2017       The blood pressure has been high for several years and usually well controlled.  Currently taking, hydralazine, clonidine and metoprolol.   Has not checked blood pressure at home   BP Readings from Last 3 Encounters:  08/28/18 130/78  08/13/18 122/71  07/23/18 104/60      LABS:  Lab on 11/19/2018  Component Date Value Ref Range Status  . Sodium 11/19/2018 137  135 - 145 mEq/L Final  . Potassium 11/19/2018 4.7  3.5 - 5.1 mEq/L Final  . Chloride 11/19/2018 113* 96 - 112 mEq/L Final  . CO2 11/19/2018 18* 19 - 32 mEq/L Final  . Glucose, Bld 11/19/2018 108* 70 - 99 mg/dL Final  . BUN 11/19/2018 29* 6 - 23 mg/dL Final  . Creatinine, Ser 11/19/2018 1.63* 0.40 - 1.50 mg/dL Final  . Total Bilirubin 11/19/2018 0.5  0.2 - 1.2 mg/dL Final  .  Alkaline Phosphatase 11/19/2018 67  39 - 117 U/L Final  . AST 11/19/2018 16  0 - 37 U/L Final  . ALT 11/19/2018 25  0 - 53 U/L Final  . Total Protein 11/19/2018 6.7  6.0 - 8.3 g/dL Final  . Albumin 11/19/2018 3.9  3.5 - 5.2 g/dL Final  . Calcium 11/19/2018 9.2  8.4 - 10.5 mg/dL Final  . GFR 11/19/2018 50.59* >60.00 mL/min Final  . Hgb A1c MFr Bld 11/19/2018 6.5  4.6 - 6.5 % Final   Glycemic Control Guidelines for People with Diabetes:Non Diabetic:  <6%Goal of Therapy: <7%Additional Action Suggested:  >8%     Physical Examination:  There were no vitals taken for this visit.      ASSESSMENT/PLAN:   Diabetes type 2, with obesity  He is on Janumet and Amaryl  A1c is 6.5 which is still fairly good although has been around 6% previously  He may have some postprandial hyperglycemia Currently not monitoring readings after meals Also not exercising much or watching his diet as discussed above. His lab glucose is also still over 100 fasting  For now we will continue his regimen unchanged especially with his renal function adequate for his Janumet dose However he thinks he can do better with planning his meals and starting back on some walking   Follow-up in 4 months  RENAL dysfunction: Etiology unclear, not from diabetes  Renal function is somewhat worse and will defer any management to PCP   HYPERTENSION: Blood pressure needs to be checked regularly and he  will follow-up with his PCP who is prescribing his medications    There are no Patient Instructions on file for this visit.   Elayne Snare 11/19/2018, 9:04 PM

## 2018-11-20 ENCOUNTER — Ambulatory Visit (INDEPENDENT_AMBULATORY_CARE_PROVIDER_SITE_OTHER): Payer: Medicare Other | Admitting: Endocrinology

## 2018-11-20 DIAGNOSIS — N289 Disorder of kidney and ureter, unspecified: Secondary | ICD-10-CM | POA: Diagnosis not present

## 2018-11-20 DIAGNOSIS — E119 Type 2 diabetes mellitus without complications: Secondary | ICD-10-CM | POA: Diagnosis not present

## 2018-11-20 DIAGNOSIS — E1129 Type 2 diabetes mellitus with other diabetic kidney complication: Secondary | ICD-10-CM

## 2018-11-22 ENCOUNTER — Ambulatory Visit: Payer: Medicare Other | Admitting: Endocrinology

## 2018-12-09 ENCOUNTER — Other Ambulatory Visit: Payer: Self-pay | Admitting: Endocrinology

## 2018-12-10 ENCOUNTER — Other Ambulatory Visit: Payer: Self-pay | Admitting: Internal Medicine

## 2018-12-24 ENCOUNTER — Other Ambulatory Visit: Payer: Self-pay | Admitting: Internal Medicine

## 2019-01-03 ENCOUNTER — Other Ambulatory Visit: Payer: Self-pay | Admitting: Internal Medicine

## 2019-01-05 NOTE — Patient Instructions (Addendum)
No blood work  All other Health Maintenance issues reviewed.   All recommended immunizations and age-appropriate screenings are up-to-date or discussed.  prevnar immunizations administered today.   Medications reviewed and updated.  Changes include :   none    Please followup in 6 months    Health Maintenance, Male Adopting a healthy lifestyle and getting preventive care are important in promoting health and wellness. Ask your health care provider about:  The right schedule for you to have regular tests and exams.  Things you can do on your own to prevent diseases and keep yourself healthy. What should I know about diet, weight, and exercise? Eat a healthy diet   Eat a diet that includes plenty of vegetables, fruits, low-fat dairy products, and lean protein.  Do not eat a lot of foods that are high in solid fats, added sugars, or sodium. Maintain a healthy weight Body mass index (BMI) is a measurement that can be used to identify possible weight problems. It estimates body fat based on height and weight. Your health care provider can help determine your BMI and help you achieve or maintain a healthy weight. Get regular exercise Get regular exercise. This is one of the most important things you can do for your health. Most adults should:  Exercise for at least 150 minutes each week. The exercise should increase your heart rate and make you sweat (moderate-intensity exercise).  Do strengthening exercises at least twice a week. This is in addition to the moderate-intensity exercise.  Spend less time sitting. Even light physical activity can be beneficial. Watch cholesterol and blood lipids Have your blood tested for lipids and cholesterol at 72 years of age, then have this test every 5 years. You may need to have your cholesterol levels checked more often if:  Your lipid or cholesterol levels are high.  You are older than 72 years of age.  You are at high risk for heart  disease. What should I know about cancer screening? Many types of cancers can be detected early and may often be prevented. Depending on your health history and family history, you may need to have cancer screening at various ages. This may include screening for:  Colorectal cancer.  Prostate cancer.  Skin cancer.  Lung cancer. What should I know about heart disease, diabetes, and high blood pressure? Blood pressure and heart disease  High blood pressure causes heart disease and increases the risk of stroke. This is more likely to develop in people who have high blood pressure readings, are of African descent, or are overweight.  Talk with your health care provider about your target blood pressure readings.  Have your blood pressure checked: ? Every 3-5 years if you are 48-12 years of age. ? Every year if you are 25 years old or older.  If you are between the ages of 63 and 5 and are a current or former smoker, ask your health care provider if you should have a one-time screening for abdominal aortic aneurysm (AAA). Diabetes Have regular diabetes screenings. This checks your fasting blood sugar level. Have the screening done:  Once every three years after age 39 if you are at a normal weight and have a low risk for diabetes.  More often and at a younger age if you are overweight or have a high risk for diabetes. What should I know about preventing infection? Hepatitis B If you have a higher risk for hepatitis B, you should be screened for this virus. Talk  with your health care provider to find out if you are at risk for hepatitis B infection. Hepatitis C Blood testing is recommended for:  Everyone born from [birth year] through 1964.  Anyone with known risk factors for hepatitis C. Sexually transmitted infections (STIs)  You should be screened each year for STIs, including gonorrhea and chlamydia, if: ? You are sexually active and are younger than 72 years of age. ? You are older  than 72 years of age and your health care provider tells you that you are at risk for this type of infection. ? Your sexual activity has changed since you were last screened, and you are at increased risk for chlamydia or gonorrhea. Ask your health care provider if you are at risk.  Ask your health care provider about whether you are at high risk for HIV. Your health care provider may recommend a prescription medicine to help prevent HIV infection. If you choose to take medicine to prevent HIV, you should first get tested for HIV. You should then be tested every 3 months for as long as you are taking the medicine. Follow these instructions at home: Lifestyle  Do not use any products that contain nicotine or tobacco, such as cigarettes, e-cigarettes, and chewing tobacco. If you need help quitting, ask your health care provider.  Do not use street drugs.  Do not share needles.  Ask your health care provider for help if you need support or information about quitting drugs. Alcohol use  Do not drink alcohol if your health care provider tells you not to drink.  If you drink alcohol: ? Limit how much you have to 0-2 drinks a day. ? Be aware of how much alcohol is in your drink. In the [place] one drink equals one 12 oz bottle of beer (355 mL), one 5 oz glass of wine (148 mL), or one 1 oz glass of hard liquor (44 mL). General instructions  Schedule regular health, dental, and eye exams.  Stay current with your vaccines.  Tell your health care provider if: ? You often feel depressed. ? You have ever been abused or do not feel safe at home. Summary  Adopting a healthy lifestyle and getting preventive care are important in promoting health and wellness.  Follow your health care provider's instructions about healthy diet, exercising, and getting tested or screened for diseases.  Follow your health care provider's instructions on monitoring your cholesterol and blood pressure. This  information is not intended to replace advice given to you by your health care provider. Make sure you discuss any questions you have with your health care provider. Document Released: 12/16/2007 Document Revised: 06/12/2018 Document Reviewed: 06/12/2018 Elsevier Patient Education  2020 [employer].

## 2019-01-05 NOTE — Progress Notes (Signed)
Subjective:    Patient ID: Noah Barnes, male    DOB: 26-Aug-1945, 73 y.o.   MRN: [id number]  HPI He is here for a physical exam.   He denies any changes in his health and has no concerns.   Medications and allergies reviewed with patient and updated if appropriate.  Patient Active Problem List   Diagnosis Date Noted  . Right flank pain 07/03/2018  . Acute right-sided low back pain without sciatica 07/03/2018  . Seasonal allergic rhinitis 05/22/2018  . Paronychia of great toe of left foot 05/22/2018  . MR (mitral regurgitation) 12/20/2017  . CKD (chronic kidney disease) stage 3, GFR 30-59 ml/min ([facility]) 08/14/2017  . Abdominal hernia without obstruction and without gangrene 01/10/2016  . Chronic diastolic heart failure ([place]) 02/25/2015  . Right renal mass 02/25/2015  . Other constipation 02/22/2015  . Chronic venous insufficiency 01/18/2015  . Superficial phlebitis 01/18/2015  . IBS (irritable bowel syndrome) 11/12/2013  . Family history of prostate cancer 12/26/2011  . Diabetes mellitus with renal complications ([place]) 54/00/8676  . Gout 12/31/2007  . HYPERLIPIDEMIA 08/14/2007  . ERECTILE DYSFUNCTION 05/09/2007  . Abnormal electrocardiogram (ECG) (EKG) 05/09/2007  . Essential hypertension 11/28/2006    Current Outpatient Medications on File Prior to Visit  Medication Sig Dispense Refill  . allopurinol (ZYLOPRIM) 300 MG tablet Take 1 tablet by mouth once daily 90 tablet 0  . aspirin EC 81 MG tablet Take 81 mg by mouth every morning.    [name] atorvastatin (LIPITOR) 20 MG tablet TAKE 1 TABLET BY MOUTH ONCE DAILY 90 tablet 3  . cloNIDine (CATAPRES) 0.1 MG tablet Take 1 tablet (0.1 mg total) by mouth 2 (two) times daily. Follow-up appt is due in Sept must see provider for future refills 180 tablet 0  . furosemide (LASIX) 20 MG tablet TAKE 1 TO 2 TABLETS BY MOUTH ONCE DAILY AS NEEDED FOR  FLUID  EDEMA 90 tablet 3  . glimepiride (AMARYL) 2 MG tablet TAKE 1 TABLET BY MOUTH ONCE DAILY  BEFORE BREAKFAST 90 tablet 0  . hydrALAZINE (APRESOLINE) 25 MG tablet Take 25 mg by mouth 2 (two) times daily.    [name] linaclotide (LINZESS) 290 MCG CAPS capsule Take 1 capsule (290 mcg total) by mouth daily before breakfast. 30 capsule 6  . metoprolol tartrate (LOPRESSOR) 100 MG tablet Take 1 tablet by mouth twice daily 180 tablet 0  . ONE TOUCH ULTRA TEST test strip USE TO TEST TWICE DAILY 100 each 3  . ONETOUCH DELICA LANCETS 19J MISC USE AS DIRECTED TWICE DAILY 100 each 0  . sildenafil (VIAGRA) 100 MG tablet TAKE AS DIRECTED **KEEP 01/06/19 OFFICE VISIT FOR FURTHER REFILLS** 6 tablet 0  . sitaGLIPtin-metformin (JANUMET) 50-1000 MG tablet TAKE 1 TABLET BY MOUTH TWICE DAILY WITH MEALS 180 tablet 2   No current facility-administered medications on file prior to visit.     Past Medical History:  Diagnosis Date  . Chronic diastolic heart failure ([place])   . Constipation   . Diabetes mellitus   . Diverticulosis   . ED (erectile dysfunction)   . Gout   . Hyperlipidemia   . Hypertension   . IBS (irritable bowel syndrome)   . Renal calculus   . Renal insufficiency   . Tubular adenoma of colon     Past Surgical History:  Procedure Laterality Date  . COLONOSCOPY  1998   negative; no F/U  . knee effusion tapped      Social History   Socioeconomic  History  . Marital status: Married    Spouse name: Not on file  . Number of children: 1  . Years of education: Not on file  . Highest education level: Not on file  Occupational History  . 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 Financial resource strain: Not hard at all  . Food insecurity    Worry: Never true    Inability: Never true  . Transportation needs    Medical: Yes    Non-medical: Yes  Tobacco Use  . Smoking status: Former Smoker    Packs/day: 0.30    Types: Cigarettes    Quit date: 07/04/1991    Years since quitting: 27.5  . Smokeless tobacco: Never Used   . Tobacco comment: smoked age [age], up to 1/3 ppd; may be less  Substance and Sexual Activity  . Alcohol use: Yes    Alcohol/week: 2.0 standard drinks    Types: 2 Glasses of wine per week    Comment: rare  . Drug use: No  . Sexual activity: Yes  Lifestyle  . Physical activity    Days per week: 0 days    Minutes per session: 0 min  . Stress: Not at all  Relationships  . Social connections    Talks on phone: More than three times a week    Gets together: More than three times a week    Attends religious service: More than 4 times per year    Active member of club or organization: Not on file    Attends meetings of clubs or organizations: More than 4 times per year    Relationship status: Married  Other Topics Concern  . Not on file  Social History Narrative  . Not on file    Family History  Problem Relation Age of Onset  . Heart attack Mother 57  . Hypertension Mother   . Prostate cancer Father 51  . Diabetes Father   . Irritable bowel syndrome Father   . Colon cancer Brother        dx in his late 82's  . Irritable bowel syndrome Sister        x 2  . Irritable bowel syndrome Brother   . Stroke Neg Hx     Review of Systems  Constitutional: Negative for chills and fever.  Eyes: Negative for visual disturbance.  Respiratory: Negative for cough, shortness of breath and wheezing.   Cardiovascular: Positive for leg swelling (intermittent). Negative for chest pain and palpitations.  Gastrointestinal: Positive for constipation (linzess controlled). Negative for abdominal pain, blood in stool, diarrhea and nausea.       Rare gerd  Genitourinary: Negative for dysuria and hematuria.  Musculoskeletal: Positive for arthralgias (knees).  Skin: Negative for color change and rash.  Neurological: Negative for light-headedness, numbness and headaches.  Psychiatric/Behavioral: Negative for dysphoric mood. The patient is not nervous/anxious.        Objective:   Vitals:    01/06/19 1456  BP: 140/62  Pulse: (!) 56  Resp: 16  Temp: 98.5 F (36.9 C)  SpO2: 97%   Filed Weights   01/06/19 1456  Weight: 212 lb 12.8 oz (96.5 kg)   Body mass index is 33.33 kg/m.  Wt Readings from Last 3 Encounters:  01/06/19 212 lb 12.8 oz (96.5 kg)  08/28/18 225 lb 12.8 oz (102.4 kg)  08/13/18 224 lb (101.6 kg)  Physical Exam Constitutional: He appears well-developed and well-nourished. No distress.  HENT:  Head: Normocephalic and atraumatic.  Right Ear: External ear normal.  Left Ear: External ear normal.  Mouth/Throat: Oropharynx is clear and moist.  Normal ear canals and TM b/l  Eyes: Conjunctivae and EOM are normal.  Neck: Neck supple. No tracheal deviation present. No thyromegaly present.  No carotid bruit  Cardiovascular: Normal rate, regular rhythm, normal heart sounds and intact distal pulses.   No murmur heard. Pulmonary/Chest: Effort normal and breath sounds normal. No respiratory distress. He has no wheezes. He has no rales.  Abdominal: Soft. He exhibits no distension. There is no tenderness.  Genitourinary: deferred  Musculoskeletal: He exhibits no edema.  Lymphadenopathy:   He has no cervical adenopathy.  Skin: Skin is warm and dry. He is not diaphoretic.  Psychiatric: He has a normal mood and affect. His behavior is normal.         Assessment & Plan:   Physical exam: Screening blood work  deferred Immunizations   prevnar today, Discussed shingrix, others up to date Colonoscopy   Up to date  -- due 03/2019 Eye exams   Due - will schedule Exercise  None - stopped when gym closed Weight  Has lost weight  - encouraged to continue efforts Skin  No concerns Substance abuse   none  See Problem List for Assessment and Plan of chronic medical problems.   FU in 6 months

## 2019-01-06 ENCOUNTER — Other Ambulatory Visit: Payer: Self-pay

## 2019-01-06 ENCOUNTER — Ambulatory Visit (INDEPENDENT_AMBULATORY_CARE_PROVIDER_SITE_OTHER): Payer: Medicare Other | Admitting: Internal Medicine

## 2019-01-06 ENCOUNTER — Encounter: Payer: Self-pay | Admitting: Internal Medicine

## 2019-01-06 VITALS — BP 140/62 | HR 56 | Temp 98.5°F | Resp 16 | Ht 67.0 in | Wt 212.8 lb

## 2019-01-06 DIAGNOSIS — N183 Chronic kidney disease, stage 3 unspecified: Secondary | ICD-10-CM

## 2019-01-06 DIAGNOSIS — Z0000 Encounter for general adult medical examination without abnormal findings: Secondary | ICD-10-CM

## 2019-01-06 DIAGNOSIS — Z23 Encounter for immunization: Secondary | ICD-10-CM

## 2019-01-06 DIAGNOSIS — I5032 Chronic diastolic (congestive) heart failure: Secondary | ICD-10-CM

## 2019-01-06 DIAGNOSIS — E782 Mixed hyperlipidemia: Secondary | ICD-10-CM

## 2019-01-06 DIAGNOSIS — I10 Essential (primary) hypertension: Secondary | ICD-10-CM

## 2019-01-06 DIAGNOSIS — E1129 Type 2 diabetes mellitus with other diabetic kidney complication: Secondary | ICD-10-CM | POA: Diagnosis not present

## 2019-01-06 DIAGNOSIS — M109 Gout, unspecified: Secondary | ICD-10-CM

## 2019-01-06 NOTE — Assessment & Plan Note (Signed)
Continue statin. 

## 2019-01-06 NOTE — Assessment & Plan Note (Signed)
Has remained stable Recent blood work reviewed

## 2019-01-06 NOTE — Assessment & Plan Note (Signed)
BP well controlled Current regimen effective and well tolerated Continue current medications at current doses  

## 2019-01-06 NOTE — Assessment & Plan Note (Signed)
Continue allopurinol 

## 2019-01-06 NOTE — Assessment & Plan Note (Signed)
Management per Dr. Dwyane Dee Sugars have been controlled Will be having blood work in a couple of months we will not do additional blood work today

## 2019-01-06 NOTE — Assessment & Plan Note (Signed)
Slight lower extremity edema, but overall euvolemic on exam Following with cardiology Continue current dose of Lasix

## 2019-01-08 LAB — HM DIABETES EYE EXAM

## 2019-01-09 ENCOUNTER — Encounter: Payer: Self-pay | Admitting: Internal Medicine

## 2019-01-11 ENCOUNTER — Other Ambulatory Visit: Payer: Self-pay | Admitting: Endocrinology

## 2019-01-13 ENCOUNTER — Other Ambulatory Visit: Payer: Self-pay | Admitting: Internal Medicine

## 2019-01-22 ENCOUNTER — Other Ambulatory Visit: Payer: Self-pay | Admitting: Internal Medicine

## 2019-01-31 ENCOUNTER — Encounter: Payer: Self-pay | Admitting: Internal Medicine

## 2019-02-10 ENCOUNTER — Other Ambulatory Visit: Payer: Self-pay | Admitting: Internal Medicine

## 2019-02-15 ENCOUNTER — Other Ambulatory Visit: Payer: Self-pay | Admitting: Internal Medicine

## 2019-03-02 ENCOUNTER — Other Ambulatory Visit: Payer: Self-pay

## 2019-03-02 ENCOUNTER — Emergency Department (HOSPITAL_COMMUNITY)
Admission: EM | Admit: 2019-03-02 | Discharge: 2019-03-02 | Disposition: A | Discharge status: Home / Self Care | Payer: Medicare Other | Attending: Emergency Medicine | Admitting: Emergency Medicine

## 2019-03-02 ENCOUNTER — Encounter (HOSPITAL_COMMUNITY): Payer: Self-pay | Admitting: Emergency Medicine

## 2019-03-02 ENCOUNTER — Emergency Department (HOSPITAL_COMMUNITY): Payer: Medicare Other

## 2019-03-02 DIAGNOSIS — I5032 Chronic diastolic (congestive) heart failure: Secondary | ICD-10-CM | POA: Insufficient documentation

## 2019-03-02 DIAGNOSIS — I130 Hypertensive heart and chronic kidney disease with heart failure and stage 1 through stage 4 chronic kidney disease, or unspecified chronic kidney disease: Secondary | ICD-10-CM | POA: Diagnosis not present

## 2019-03-02 DIAGNOSIS — Z7982 Long term (current) use of aspirin: Secondary | ICD-10-CM | POA: Diagnosis not present

## 2019-03-02 DIAGNOSIS — N183 Chronic kidney disease, stage 3 (moderate): Secondary | ICD-10-CM | POA: Diagnosis not present

## 2019-03-02 DIAGNOSIS — E1122 Type 2 diabetes mellitus with diabetic chronic kidney disease: Secondary | ICD-10-CM | POA: Diagnosis not present

## 2019-03-02 DIAGNOSIS — R319 Hematuria, unspecified: Secondary | ICD-10-CM

## 2019-03-02 DIAGNOSIS — Z87891 Personal history of nicotine dependence: Secondary | ICD-10-CM | POA: Diagnosis not present

## 2019-03-02 DIAGNOSIS — R1030 Lower abdominal pain, unspecified: Secondary | ICD-10-CM | POA: Diagnosis present

## 2019-03-02 DIAGNOSIS — N200 Calculus of kidney: Secondary | ICD-10-CM | POA: Diagnosis not present

## 2019-03-02 DIAGNOSIS — N289 Disorder of kidney and ureter, unspecified: Secondary | ICD-10-CM

## 2019-03-02 DIAGNOSIS — R109 Unspecified abdominal pain: Secondary | ICD-10-CM

## 2019-03-02 DIAGNOSIS — Z7984 Long term (current) use of oral hypoglycemic drugs: Secondary | ICD-10-CM | POA: Insufficient documentation

## 2019-03-02 LAB — COMPREHENSIVE METABOLIC PANEL
ALT: 23 U/L (ref 0–44)
AST: 14 U/L — ABNORMAL LOW (ref 15–41)
Albumin: 4 g/dL (ref 3.5–5.0)
Alkaline Phosphatase: 67 U/L (ref 38–126)
Anion gap: 10 (ref 5–15)
BUN: 24 mg/dL — ABNORMAL HIGH (ref 8–23)
CO2: 18 mmol/L — ABNORMAL LOW (ref 22–32)
Calcium: 9.6 mg/dL (ref 8.9–10.3)
Chloride: 113 mmol/L — ABNORMAL HIGH (ref 98–111)
Creatinine, Ser: 1.93 mg/dL — ABNORMAL HIGH (ref 0.61–1.24)
GFR calc Af Amer: 39 mL/min — ABNORMAL LOW (ref >60)
GFR calc non Af Amer: 34 mL/min — ABNORMAL LOW (ref >60)
Glucose, Bld: 143 mg/dL — ABNORMAL HIGH (ref 70–99)
Potassium: 4.6 mmol/L (ref 3.5–5.1)
Sodium: 141 mmol/L (ref 135–145)
Total Bilirubin: 0.9 mg/dL (ref 0.3–1.2)
Total Protein: 7.1 g/dL (ref 6.5–8.1)

## 2019-03-02 LAB — URINALYSIS, ROUTINE W REFLEX MICROSCOPIC
Bacteria, UA: NONE SEEN
Bilirubin Urine: NEGATIVE
Glucose, UA: NEGATIVE mg/dL
Ketones, ur: NEGATIVE mg/dL
Nitrite: NEGATIVE
Protein, ur: 100 mg/dL — AB
RBC / HPF: >50 RBC/hpf — ABNORMAL HIGH (ref 0–5)
Specific Gravity, Urine: 1.018 (ref 1.005–1.030)
pH: 5 (ref 5.0–8.0)

## 2019-03-02 LAB — LIPASE, BLOOD: Lipase: 43 U/L (ref 11–51)

## 2019-03-02 LAB — CBC
HCT: 41.2 % (ref 39.0–52.0)
Hemoglobin: 12.6 g/dL — ABNORMAL LOW (ref 13.0–17.0)
MCH: 26.7 pg (ref 26.0–34.0)
MCHC: 30.6 g/dL (ref 30.0–36.0)
MCV: 87.3 fL (ref 80.0–100.0)
Platelets: 251 10*3/uL (ref 150–400)
RBC: 4.72 MIL/uL (ref 4.22–5.81)
RDW: 14.6 % (ref 11.5–15.5)
WBC: 7.2 10*3/uL (ref 4.0–10.5)
nRBC: 0 % (ref 0.0–0.2)

## 2019-03-02 MED ORDER — IOHEXOL 300 MG/ML  SOLN: 30.0000 mL | Freq: Once | INTRAMUSCULAR | Status: DC | PRN

## 2019-03-02 NOTE — Discharge Instructions (Addendum)
Your work-up today was reassuring.  However your kidney function is worsening and you have kidney stones in both kidneys.  Your urine today shows some blood in it which is consistent with kidney stones.  I would recommend following up with a urologist for reevaluation of your kidney stones, blood in your urine, and your kidney function.  Continue taking your home medications as prescribed.  Drink plenty of fluids and get plenty of rest.  I would have your gastroenterologist or PCP recheck your BUN/creatinine (kidney function test) in the next few days to ensure it is improving.   Return to the emergency department if any concerning signs or symptoms develop such as worsening pain, persistent vomiting, high fevers, or decreased urine production.

## 2019-03-02 NOTE — ED Triage Notes (Signed)
Per pt, states lower, left abdominal pain for 2 days-increased pain yesterday-states scheduled for colonoscopy on the 26 th-states pain has subsided today-no N/V/D-

## 2019-03-02 NOTE — ED Provider Notes (Signed)
[facility] DEPT Provider Note   CSN: [id number] Arrival date & time: 03/02/19  1029     History   Chief Complaint Chief Complaint  Patient presents with  . Abdominal Pain    HPI Noah Barnes is a 73 y.o. male with history of chronic diastolic heart failure, IBS, diabetes mellitus, diverticulosis, hyperlipidemia, hypertension, renal insufficiency presenting for evaluation of acute onset, somewhat improving lower abdominal pain for 2 days.  Reports symptoms began yesterday morning, crampy pressure sensation worse in the left lower quadrant of the abdomen.  No aggravating or alleviating factors noted.  He reports that he drank a bowel purge medication because his Linzess was not working for him.  He did have a loose, nonbloody bowel movement afterwards.  Pain has improved today.  He denies fever, chills, nausea, vomiting, chest pain, shortness of breath, urinary symptoms, no known sick contacts or suspicious food intake.  Reports he has had similar pains previously but this was more severe.     The history is provided by the patient.    Past Medical History:  Diagnosis Date  . Chronic diastolic heart failure ([place])   . Constipation   . Diabetes mellitus   . Diverticulosis   . ED (erectile dysfunction)   . Gout   . Hyperlipidemia   . Hypertension   . IBS (irritable bowel syndrome)   . Renal calculus   . Renal insufficiency   . Tubular adenoma of colon     Patient Active Problem List   Diagnosis Date Noted  . Right flank pain 07/03/2018  . Acute right-sided low back pain without sciatica 07/03/2018  . Seasonal allergic rhinitis 05/22/2018  . Paronychia of great toe of left foot 05/22/2018  . MR (mitral regurgitation) 12/20/2017  . CKD (chronic kidney disease) stage 3, GFR 30-59 ml/min (HCC) 08/14/2017  . Abdominal hernia without obstruction and without gangrene 01/10/2016  . Chronic diastolic heart failure ([place]) 02/25/2015  . Right renal  mass 02/25/2015  . Other constipation 02/22/2015  . Chronic venous insufficiency 01/18/2015  . Superficial phlebitis 01/18/2015  . IBS (irritable bowel syndrome) 11/12/2013  . Family history of prostate cancer 12/26/2011  . Diabetes mellitus with renal complications ([place]) 40/98/1191  . Gout 12/31/2007  . HYPERLIPIDEMIA 08/14/2007  . ERECTILE DYSFUNCTION 05/09/2007  . Abnormal electrocardiogram (ECG) (EKG) 05/09/2007  . Essential hypertension 11/28/2006    Past Surgical History:  Procedure Laterality Date  . COLONOSCOPY  1998   negative; no F/U  . knee effusion tapped          Home Medications    Prior to Admission medications   Medication Sig Start Date End Date Taking? Authorizing Provider  allopurinol (ZYLOPRIM) 300 MG tablet Take 1 tablet by mouth once daily 01/13/19  Yes Burns, Claudina Lick, MD  aspirin EC 81 MG tablet Take 81 mg by mouth every morning.   Yes [provider]  atorvastatin (LIPITOR) 20 MG tablet Take 1 tablet by mouth once daily 02/17/19  Yes Burns, Claudina Lick, MD  cloNIDine (CATAPRES) 0.1 MG tablet Take 1 tablet (0.1 mg total) by mouth 2 (two) times daily. Follow-up appt is due in Sept must see provider for future refills 01/06/19  Yes Burns, Claudina Lick, MD  furosemide (LASIX) 20 MG tablet TAKE 1 TO 2 TABLETS BY MOUTH ONCE DAILY AS NEEDED FOR  FLUID  EDEMA Patient taking differently: Take 20-40 mg by mouth daily as needed for fluid or edema.  12/21/17  Yes Billey Gosling  J, MD  glimepiride (AMARYL) 2 MG tablet TAKE 1 TABLET BY MOUTH ONCE DAILY BEFORE BREAKFAST Patient taking differently: Take 2 mg by mouth daily with breakfast. TAKE 1 TABLET BY MOUTH ONCE DAILY BEFORE BREAKFAST 12/09/18  Yes Elayne Snare, MD  hydrALAZINE (APRESOLINE) 25 MG tablet Take 25 mg by mouth 2 (two) times daily.   Yes [provider]  LINZESS 290 MCG CAPS capsule TAKE 1 CAPSULE BY MOUTH ONCE DAILY BEFORE BREAKFAST Patient taking differently: Take 290 mcg by mouth daily before breakfast.   02/10/19  Yes Pyrtle, Lajuan Lines, MD  metoprolol tartrate (LOPRESSOR) 100 MG tablet Take 1 tablet by mouth twice daily 12/24/18  Yes Burns, Claudina Lick, MD  Na Sulfate-K Sulfate-Mg Sulf (SUPREP BOWEL PREP KIT) 17.5-3.13-1.6 GM/177ML SOLN Take by mouth.   Yes [provider]  sitaGLIPtin-metformin (JANUMET) 50-1000 MG tablet TAKE 1 TABLET BY MOUTH TWICE DAILY WITH MEALS 05/28/18  Yes Elayne Snare, MD  Lancets [facility] DELICA PLUS [id number]) [place] USE AS DIRECTED TWICE A DAILY 01/14/19   Elayne Snare, MD  [facility] ULTRA test strip USE TO TEST TWICE DAILY 01/14/19   Elayne Snare, MD  sildenafil (VIAGRA) 100 MG tablet TAKE AS DIRECTED Patient not taking: Reported on 03/02/2019 01/22/19   Binnie Rail, MD    Family History Family History  Problem Relation Age of Onset  . Heart attack Mother 86  . Hypertension Mother   . Prostate cancer Father 82  . Diabetes Father   . Irritable bowel syndrome Father   . Colon cancer Brother        dx in his late 79's  . Irritable bowel syndrome Sister        x 2  . Irritable bowel syndrome Brother   . Stroke Neg Hx     Social History Social History   Tobacco Use  . Smoking status: Former Smoker    Packs/day: 0.30    Types: Cigarettes    Quit date: 07/04/1991    Years since quitting: 27.6  . Smokeless tobacco: Never Used  . Tobacco comment: smoked age [age], up to 1/3 ppd; may be less  Substance Use Topics  . Alcohol use: Yes    Alcohol/week: 2.0 standard drinks    Types: 2 Glasses of wine per week    Comment: rare  . Drug use: No     Allergies   Ace inhibitors, Angiotensin receptor blockers, and Losartan   Review of Systems Review of Systems  Constitutional: Negative for chills and fever.  Respiratory: Negative for shortness of breath.   Cardiovascular: Negative for chest pain.  Gastrointestinal: Positive for abdominal pain, constipation and diarrhea. Negative for nausea and vomiting.  Genitourinary: Negative for dysuria, frequency and  hematuria.  All other systems reviewed and are negative.    Physical Exam Updated Vital Signs BP (!) 160/81 (BP Location: Right Arm)   Pulse (!) 44   Temp 98.8 F (37.1 C) (Oral)   Resp 18   SpO2 98%   Physical Exam Vitals signs and nursing note reviewed.  Constitutional:      General: He is not in acute distress.    Appearance: He is well-developed.  HENT:     Head: Normocephalic and atraumatic.  Eyes:     General:        Right eye: No discharge.        Left eye: No discharge.     Conjunctiva/sclera: Conjunctivae normal.  Neck:     Vascular: No JVD.  Trachea: No tracheal deviation.  Cardiovascular:     Rate and Rhythm: Normal rate and regular rhythm.  Pulmonary:     Effort: Pulmonary effort is normal.     Breath sounds: Normal breath sounds.  Abdominal:     General: Abdomen is protuberant. Bowel sounds are decreased. There is no distension.     Palpations: Abdomen is soft.     Tenderness: There is abdominal tenderness in the left lower quadrant. There is no right CVA tenderness, left CVA tenderness, guarding or rebound.  Skin:    General: Skin is warm and dry.     Findings: No erythema.  Neurological:     Mental Status: He is alert.  Psychiatric:        Behavior: Behavior normal.      ED Treatments / Results  Labs (all labs ordered are listed, but only abnormal results are displayed) Labs Reviewed  COMPREHENSIVE METABOLIC PANEL - Abnormal; Notable for the following components:      Result Value   Chloride 113 (*)    CO2 18 (*)    Glucose, Bld 143 (*)    BUN 24 (*)    Creatinine, Ser 1.93 (*)    AST 14 (*)    GFR calc non Af Amer 34 (*)    GFR calc Af Amer 39 (*)    All other components within normal limits  CBC - Abnormal; Notable for the following components:   Hemoglobin 12.6 (*)    All other components within normal limits  URINALYSIS, ROUTINE W REFLEX MICROSCOPIC - Abnormal; Notable for the following components:   APPearance CLOUDY (*)     Hgb urine dipstick LARGE (*)    Protein, ur 100 (*)    Leukocytes,Ua TRACE (*)    RBC / HPF >50 (*)    All other components within normal limits  LIPASE, BLOOD    EKG None  Radiology Ct Abdomen Pelvis Wo Contrast  Result Date: 03/02/2019 CLINICAL DATA:  72 year old male with acute LEFT abdominal and pelvic pain for 2 days. EXAM: CT ABDOMEN AND PELVIS WITHOUT CONTRAST TECHNIQUE: Multidetector CT imaging of the abdomen and pelvis was performed following the standard protocol without IV contrast. COMPARISON:  03/02/2015 FINDINGS: Please note that parenchymal abnormalities may be missed without intravenous contrast. Lower chest: No acute abnormality. Hepatobiliary: The liver and gallbladder are unremarkable. No biliary dilatation. Pancreas: Unremarkable Spleen: Unremarkable Adrenals/Urinary Tract: A 12 mm calculus is present within the LEFT renal pelvis with mild fullness of the LEFT renal calices, both new since 2016. A nonobstructing 7 mm calculus within the RIGHT LOWER kidney and 10 mm calculus within the LEFT LOWER kidney are noted. A RIGHT renal cyst is present. The adrenal glands and bladder are unremarkable. Stomach/Bowel: Stomach is within normal limits. Appendix appears normal. No evidence of bowel wall thickening, distention, or inflammatory changes. Colonic diverticulosis noted without evidence of diverticulitis. Vascular/Lymphatic: Aortic atherosclerosis. No enlarged abdominal or pelvic lymph nodes. Reproductive: Prostate is unremarkable. Other: No ascites, pneumoperitoneum or focal collection. Musculoskeletal: No acute or suspicious bony abnormalities. Mild degenerative changes in the lumbar spine and hips noted. IMPRESSION: 1. 12 mm calculus within the LEFT renal pelvis with mild fullness of the LEFT renal calices, both new since 2016. 2. Bilateral nephrolithiasis 3. Colonic diverticulosis without evidence of diverticulitis. 4.  Aortic Atherosclerosis (ICD10-I70.0). Electronically Signed    By: Margarette Canada M.D.   On: 03/02/2019 15:07   Dg Abd 2 Views  Result Date: 03/02/2019 CLINICAL DATA:  Left  lower quadrant abdominal pain for the past 2 days. EXAM: ABDOMEN - 2 VIEW COMPARISON:  Abdominal x-ray dated February 26, 2017. FINDINGS: The bowel gas pattern is normal. There is no evidence of free air. Unchanged 11 mm left renal calculus. No acute osseous abnormality. IMPRESSION: 1. No acute findings. 2. Unchanged left nephrolithiasis. Electronically Signed   By: Titus Dubin M.D.   On: 03/02/2019 11:48    Procedures Procedures (including critical care time)  Medications Ordered in ED Medications - No data to display   Initial Impression / Assessment and Plan / ED Course  I have reviewed the triage vital signs and the nursing notes.  Pertinent labs & imaging results that were available during my care of the patient were reviewed by me and considered in my medical decision making (see chart for details).        Patient presenting for evaluation of abdominal pain.  He has chronic abdominal pain secondary to IBS reports this feels similar just a little more severe.  He is afebrile, vital signs at patient's baseline.  He is nontoxic in appearance.  No peritoneal signs on examination of the abdomen.  Pain is worse in the left lower quadrant, so we will obtain lab work and imaging to rule out diverticulitis.  Lab work reviewed by me shows no leukocytosis, mild anemia, worsening renal insufficiency compared to blood work over the last few months.  Due to this a noncontrast CT was obtained which shows no evidence of obstruction, perforation, appendicitis, cholecystitis, or other acute surgical abdominal pathology.  He does have a 12 mm calculus within the left renal pelvis with mild fullness of the left renal calyces as well as bilateral nephrolithiasis.  He has some hematuria on his UA, question if this and his worsening renal insufficiency could be secondary to his nephrolithiasis.  He  has no signs of obstructing stones within the ureters.  No evidence of UTI on UA.  On reevaluation patient is resting comfortably no apparent distress, reports that his pain has entirely resolved.  He is tolerating p.o. fluids without difficulty and serial abdominal examinations remained benign.  He has an appointment to see his gastroenterologist this week and I encouraged him to keep his appointment.  I will refer him to a urologist for reevaluation of his kidney stones.  No further emergent work-up required at this time.  Discussed strict ED return precautions.  Patient and wife verbalized understanding of and agreement with plan and patient stable for discharge home at this time.  Final Clinical Impressions(s) / ED Diagnoses   Final diagnoses:  Abdominal pain  Bilateral nephrolithiasis  Renal insufficiency  Hematuria, unspecified type    ED Discharge Orders    None       Debroah Baller 03/03/19 1122    Lacretia Leigh, MD 03/03/19 1445

## 2019-03-02 NOTE — ED Notes (Signed)
Patient ambulated to restroom and back to room without complication or assistance.

## 2019-03-05 NOTE — Progress Notes (Signed)
  Subjective:    Patient ID: Noah Barnes, male    DOB: 10/23/1945, 73 y.o.   MRN: [id number]  HPI The patient is here for follow up.   He is here for follow up from the ED.   He went to the ED with abdominal pain that started two days prior.  He was acute onset and improved a little over the two days.  The pain was a crampy pressure sensation worse in the LLQ.  There were no aggravating or alleviating factors.  He had drank a bowel purge medication because linzess was not working, so he did have loose BM's after.  His urine was a slightly different color. He denied fever, nausea, chest pain, SOB and urinary symptoms.  He has had similar pains in the past that were not this severe.   ED:  He appeared nontoxic.  He had mild anemia and worsened GFR.  Noncontrast CT done and showed 12 mm calculus in the left renal pelvis with mild fullness of the left renal calyces and b/l nephrolithiasis.  No obstructing stones.  UA shoed some hematuria.   There was no UTI.  Pain had entirely resolved when in ED.  He was tolerating po fluids.  He has a f/u with GI his week.  He was referred to urology.  He has not had any pain since prior to going to the ED.  He is drinking a lot of fluids. He denies dysuria, hematuria and difficulty urinating.  He denies fevers. He has a follow up with GI.  He plans on calling urology to set up an appointment.    CKD:  He has increased his fluids since being in the ED.    Diabetes:  He is taking all of his medications as prescribed.   he is eating well.  His sugar this morning was 106.    Hypertension: He is taking his medication daily. He is compliant with a low sodium diet.     Medications and allergies reviewed with patient and updated if appropriate.  Patient Active Problem List   Diagnosis Date Noted  . Right flank pain 07/03/2018  . Acute right-sided low back pain without sciatica 07/03/2018  . Seasonal allergic rhinitis 05/22/2018  . Paronychia of great toe  of left foot 05/22/2018  . MR (mitral regurgitation) 12/20/2017  . CKD (chronic kidney disease) stage 3, GFR 30-59 ml/min (HCC) 08/14/2017  . Abdominal hernia without obstruction and without gangrene 01/10/2016  . Chronic diastolic heart failure (HCC) 02/25/2015  . Right renal mass 02/25/2015  . Other constipation 02/22/2015  . Chronic venous insufficiency 01/18/2015  . Superficial phlebitis 01/18/2015  . IBS (irritable bowel syndrome) 11/12/2013  . Family history of prostate cancer 12/26/2011  . Diabetes mellitus with renal complications (HCC) 12/18/2008  . Gout 12/31/2007  . HYPERLIPIDEMIA 08/14/2007  . ERECTILE DYSFUNCTION 05/09/2007  . Abnormal electrocardiogram (ECG) (EKG) 05/09/2007  . Essential hypertension 11/28/2006    Current Outpatient Medications on File Prior to Visit  Medication Sig Dispense Refill  . allopurinol (ZYLOPRIM) 300 MG tablet Take 1 tablet by mouth once daily 90 tablet 1  . aspirin EC 81 MG tablet Take 81 mg by mouth every morning.    . atorvastatin (LIPITOR) 20 MG tablet Take 1 tablet by mouth once daily 90 tablet 0  . cloNIDine (CATAPRES) 0.1 MG tablet Take 1 tablet (0.1 mg total) by mouth 2 (two) times daily. Follow-up appt is due in Sept must see provider for future refills   180 tablet 0  . furosemide (LASIX) 20 MG tablet TAKE 1 TO 2 TABLETS BY MOUTH ONCE DAILY AS NEEDED FOR  FLUID  EDEMA (Patient taking differently: Take 20-40 mg by mouth daily as needed for fluid or edema. ) 90 tablet 3  . glimepiride (AMARYL) 2 MG tablet TAKE 1 TABLET BY MOUTH ONCE DAILY BEFORE BREAKFAST (Patient taking differently: Take 2 mg by mouth daily with breakfast. TAKE 1 TABLET BY MOUTH ONCE DAILY BEFORE BREAKFAST) 90 tablet 0  . hydrALAZINE (APRESOLINE) 25 MG tablet Take 25 mg by mouth 2 (two) times daily.    . Lancets (ONETOUCH DELICA PLUS LANCET30G) MISC USE AS DIRECTED TWICE A DAILY 100 each 0  . LINZESS 290 MCG CAPS capsule TAKE 1 CAPSULE BY MOUTH ONCE DAILY BEFORE BREAKFAST  (Patient taking differently: Take 290 mcg by mouth daily before breakfast. ) 30 capsule 1  . metoprolol tartrate (LOPRESSOR) 100 MG tablet Take 1 tablet by mouth twice daily 180 tablet 0  . Na Sulfate-K Sulfate-Mg Sulf (SUPREP BOWEL PREP KIT) 17.5-3.13-1.6 GM/177ML SOLN Take by mouth.    . ONETOUCH ULTRA test strip USE TO TEST TWICE DAILY 100 strip 3  . sildenafil (VIAGRA) 100 MG tablet TAKE AS DIRECTED 6 tablet 1  . sitaGLIPtin-metformin (JANUMET) 50-1000 MG tablet TAKE 1 TABLET BY MOUTH TWICE DAILY WITH MEALS 180 tablet 2   No current facility-administered medications on file prior to visit.     Past Medical History:  Diagnosis Date  . Chronic diastolic heart failure (HCC)   . Constipation   . Diabetes mellitus   . Diverticulosis   . ED (erectile dysfunction)   . Gout   . Hyperlipidemia   . Hypertension   . IBS (irritable bowel syndrome)   . Renal calculus   . Renal insufficiency   . Tubular adenoma of colon     Past Surgical History:  Procedure Laterality Date  . COLONOSCOPY  1998   negative; no F/U  . knee effusion tapped      Social History   Socioeconomic History  . Marital status: Married    Spouse name: Not on file  . Number of children: 1  . Years of education: Not on file  . Highest education level: Not on file  Occupational History  . Occupation: School Bus Driver     Employer: [employer]    Comment: [phone]  Social Needs  . Financial resource strain: Not hard at all  . Food insecurity    Worry: Never true    Inability: Never true  . Transportation needs    Medical: Yes    Non-medical: Yes  Tobacco Use  . Smoking status: Former Smoker    Packs/day: 0.30    Types: Cigarettes    Quit date: 07/04/1991    Years since quitting: 27.6  . Smokeless tobacco: Never Used  . Tobacco comment: smoked age [age], up to 1/3 ppd; may be less  Substance and Sexual Activity  . Alcohol use: Yes    Alcohol/week: 2.0 standard drinks    Types: 2  Glasses of wine per week    Comment: rare  . Drug use: No  . Sexual activity: Yes  Lifestyle  . Physical activity    Days per week: 0 days    Minutes per session: 0 min  . Stress: Not at all  Relationships  . Social connections    Talks on phone: More than three times a week    Gets together: More   than three times a week    Attends religious service: More than 4 times per year    Active member of club or organization: Not on file    Attends meetings of clubs or organizations: More than 4 times per year    Relationship status: Married  Other Topics Concern  . Not on file  Social History Narrative  . Not on file    Family History  Problem Relation Age of Onset  . Heart attack Mother 69  . Hypertension Mother   . Prostate cancer Father 85  . Diabetes Father   . Irritable bowel syndrome Father   . Colon cancer Brother        dx in his late 40's  . Irritable bowel syndrome Sister        x 2  . Irritable bowel syndrome Brother   . Stroke Neg Hx     Review of Systems  Constitutional: Negative for chills and fever.  Gastrointestinal: Negative for abdominal pain and nausea.  Genitourinary: Negative for difficulty urinating, dysuria, frequency and hematuria.  Musculoskeletal: Negative for back pain.  Neurological: Negative for light-headedness and headaches.       Objective:   Vitals:   03/06/19 1005  BP: 116/72  Pulse: (!) 49  Temp: 98.1 F (36.7 C)  SpO2: 96%   BP Readings from Last 3 Encounters:  03/06/19 116/72  03/02/19 (!) 160/81  01/06/19 140/62   Wt Readings from Last 3 Encounters:  03/06/19 223 lb (101.2 kg)  01/06/19 212 lb 12.8 oz (96.5 kg)  08/28/18 225 lb 12.8 oz (102.4 kg)   Body mass index is 34.93 kg/m.   Physical Exam    Constitutional: Appears well-developed and well-nourished. No distress.  HENT:  Head: Normocephalic and atraumatic.  Neck: Neck supple. No tracheal deviation present. No thyromegaly present.  No cervical  lymphadenopathy Cardiovascular: Normal rate, regular rhythm and normal heart sounds.   No murmur heard. No carotid bruit .  No edema Pulmonary/Chest: Effort normal and breath sounds normal. No respiratory distress. No has no wheezes. No rales.  Skin: Skin is warm and dry. Not diaphoretic.  Psychiatric: Normal mood and affect. Behavior is normal.    Lab Results  Component Value Date   WBC 7.2 03/02/2019   HGB 12.6 (L) 03/02/2019   HCT 41.2 03/02/2019   PLT 251 03/02/2019   GLUCOSE 143 (H) 03/02/2019   CHOL 132 12/20/2017   TRIG 68.0 12/20/2017   HDL 40.80 12/20/2017   LDLDIRECT 80.1 04/15/2014   LDLCALC 78 12/20/2017   ALT 23 03/02/2019   AST 14 (L) 03/02/2019   NA 141 03/02/2019   K 4.6 03/02/2019   CL 113 (H) 03/02/2019   CREATININE 1.93 (H) 03/02/2019   BUN 24 (H) 03/02/2019   CO2 18 (L) 03/02/2019   TSH 2.31 12/20/2017   PSA 0.70 12/18/2016   HGBA1C 6.5 11/19/2018   MICROALBUR 4.7 (H) 03/19/2018   CT ABDOMEN PELVIS WO CONTRAST CLINICAL DATA:  73-year-old male with acute LEFT abdominal and pelvic pain for 2 days.  EXAM: CT ABDOMEN AND PELVIS WITHOUT CONTRAST  TECHNIQUE: Multidetector CT imaging of the abdomen and pelvis was performed following the standard protocol without IV contrast.  COMPARISON:  03/02/2015  FINDINGS: Please note that parenchymal abnormalities may be missed without intravenous contrast.  Lower chest: No acute abnormality.  Hepatobiliary: The liver and gallbladder are unremarkable. No biliary dilatation.  Pancreas: Unremarkable  Spleen: Unremarkable  Adrenals/Urinary Tract: A 12 mm calculus is present   within the LEFT renal pelvis with mild fullness of the LEFT renal calices, both new since 2016.  A nonobstructing 7 mm calculus within the RIGHT LOWER kidney and 10 mm calculus within the LEFT LOWER kidney are noted.  A RIGHT renal cyst is present.  The adrenal glands and bladder are unremarkable.  Stomach/Bowel: Stomach is  within normal limits. Appendix appears normal. No evidence of bowel wall thickening, distention, or inflammatory changes. Colonic diverticulosis noted without evidence of diverticulitis.  Vascular/Lymphatic: Aortic atherosclerosis. No enlarged abdominal or pelvic lymph nodes.  Reproductive: Prostate is unremarkable.  Other: No ascites, pneumoperitoneum or focal collection.  Musculoskeletal: No acute or suspicious bony abnormalities. Mild degenerative changes in the lumbar spine and hips noted.  IMPRESSION: 1. 12 mm calculus within the LEFT renal pelvis with mild fullness of the LEFT renal calices, both new since 2016. 2. Bilateral nephrolithiasis 3. Colonic diverticulosis without evidence of diverticulitis. 4.  Aortic Atherosclerosis (ICD10-I70.0).  Electronically Signed   By: Margarette Canada M.D.   On: 03/02/2019 15:07 DG Abd 2 Views CLINICAL DATA:  Left lower quadrant abdominal pain for the past 2 days.  EXAM: ABDOMEN - 2 VIEW  COMPARISON:  Abdominal x-ray dated February 26, 2017.  FINDINGS: The bowel gas pattern is normal. There is no evidence of free air. Unchanged 11 mm left renal calculus. No acute osseous abnormality.  IMPRESSION: 1. No acute findings. 2. Unchanged left nephrolithiasis.  Electronically Signed   By: Titus Dubin M.D.   On: 03/02/2019 11:48    Assessment & Plan:    See Problem List for Assessment and Plan of chronic medical problems.

## 2019-03-06 ENCOUNTER — Encounter: Payer: Self-pay | Admitting: Internal Medicine

## 2019-03-06 ENCOUNTER — Other Ambulatory Visit: Payer: Self-pay

## 2019-03-06 ENCOUNTER — Other Ambulatory Visit (INDEPENDENT_AMBULATORY_CARE_PROVIDER_SITE_OTHER): Payer: Medicare Other

## 2019-03-06 ENCOUNTER — Ambulatory Visit (INDEPENDENT_AMBULATORY_CARE_PROVIDER_SITE_OTHER): Payer: Medicare Other | Admitting: Internal Medicine

## 2019-03-06 ENCOUNTER — Ambulatory Visit (AMBULATORY_SURGERY_CENTER): Payer: Self-pay

## 2019-03-06 VITALS — BP 116/72 | HR 49 | Temp 98.1°F | Ht 67.0 in | Wt 223.0 lb

## 2019-03-06 VITALS — Ht 68.0 in | Wt 223.0 lb

## 2019-03-06 DIAGNOSIS — N183 Chronic kidney disease, stage 3 (moderate): Secondary | ICD-10-CM

## 2019-03-06 DIAGNOSIS — E1129 Type 2 diabetes mellitus with other diabetic kidney complication: Secondary | ICD-10-CM | POA: Diagnosis not present

## 2019-03-06 DIAGNOSIS — N179 Acute kidney failure, unspecified: Secondary | ICD-10-CM | POA: Diagnosis not present

## 2019-03-06 DIAGNOSIS — N200 Calculus of kidney: Secondary | ICD-10-CM

## 2019-03-06 DIAGNOSIS — Z86010 Personal history of colonic polyps: Secondary | ICD-10-CM

## 2019-03-06 DIAGNOSIS — I10 Essential (primary) hypertension: Secondary | ICD-10-CM

## 2019-03-06 DIAGNOSIS — E782 Mixed hyperlipidemia: Secondary | ICD-10-CM | POA: Diagnosis not present

## 2019-03-06 DIAGNOSIS — Z23 Encounter for immunization: Secondary | ICD-10-CM | POA: Diagnosis not present

## 2019-03-06 DIAGNOSIS — Z800 Family history of malignant neoplasm of digestive organs: Secondary | ICD-10-CM

## 2019-03-06 LAB — BASIC METABOLIC PANEL
BUN: 31 mg/dL — ABNORMAL HIGH (ref 6–23)
CO2: 18 mEq/L — ABNORMAL LOW (ref 19–32)
Calcium: 9.5 mg/dL (ref 8.4–10.5)
Chloride: 114 mEq/L — ABNORMAL HIGH (ref 96–112)
Creatinine, Ser: 1.77 mg/dL — ABNORMAL HIGH (ref 0.40–1.50)
GFR: 45.96 mL/min — ABNORMAL LOW (ref >60.00)
Glucose, Bld: 111 mg/dL — ABNORMAL HIGH (ref 70–99)
Potassium: 5.2 mEq/L — ABNORMAL HIGH (ref 3.5–5.1)
Sodium: 141 mEq/L (ref 135–145)

## 2019-03-06 LAB — URINALYSIS, ROUTINE W REFLEX MICROSCOPIC
Bilirubin Urine: NEGATIVE
Ketones, ur: NEGATIVE
Leukocytes,Ua: NEGATIVE
Nitrite: NEGATIVE
Specific Gravity, Urine: >=1.03 — AB (ref 1.000–1.030)
Total Protein, Urine: 100 — AB
Urine Glucose: NEGATIVE
Urobilinogen, UA: 0.2 (ref 0.0–1.0)
pH: 5 (ref 5.0–8.0)

## 2019-03-06 LAB — LIPID PANEL
Cholesterol: 101 mg/dL (ref 0–200)
HDL: 36.2 mg/dL — ABNORMAL LOW (ref >39.00)
LDL Cholesterol: 52 mg/dL (ref 0–99)
NonHDL: 64.84
Total CHOL/HDL Ratio: 3
Triglycerides: 64 mg/dL (ref 0.0–149.0)
VLDL: 12.8 mg/dL (ref 0.0–40.0)

## 2019-03-06 LAB — MICROALBUMIN / CREATININE URINE RATIO
Creatinine,U: 146.1 mg/dL
Microalb Creat Ratio: 31.7 mg/g — ABNORMAL HIGH (ref 0.0–30.0)
Microalb, Ur: 46.4 mg/dL — ABNORMAL HIGH (ref 0.0–1.9)

## 2019-03-06 LAB — HEPATIC FUNCTION PANEL
ALT: 21 U/L (ref 0–53)
AST: 12 U/L (ref 0–37)
Albumin: 4.1 g/dL (ref 3.5–5.2)
Alkaline Phosphatase: 70 U/L (ref 39–117)
Bilirubin, Direct: 0.1 mg/dL (ref 0.0–0.3)
Total Bilirubin: 0.5 mg/dL (ref 0.2–1.2)
Total Protein: 6.7 g/dL (ref 6.0–8.3)

## 2019-03-06 LAB — HEMOGLOBIN A1C: Hgb A1c MFr Bld: 6.3 % (ref 4.6–6.5)

## 2019-03-06 MED ORDER — NA SULFATE-K SULFATE-MG SULF 17.5-3.13-1.6 GM/177ML PO SOLN: 1.0000 | Freq: Once | ORAL | 0 refills | Status: AC

## 2019-03-06 NOTE — Assessment & Plan Note (Signed)
Worsening of kidney function in ED - possibly secondary to nephrolithiasis cmp today Will see urology

## 2019-03-06 NOTE — Assessment & Plan Note (Signed)
Went to ED for Abd pain, discolored urine See on CT in ED Symptoms resolved Likely passed a stone Has b/l stones UA today, cmp Will make an appt with urology

## 2019-03-06 NOTE — Assessment & Plan Note (Addendum)
Sugars well controlled at home Has upcoming appt with Dr Dwyane Dee Will check a1c, urice acid

## 2019-03-06 NOTE — Progress Notes (Signed)
Denies allergies to eggs or soy products. Denies complication of anesthesia or sedation. Denies use of weight loss medication. Denies use of O2.   Emmi instructions given for colonoscopy.  

## 2019-03-06 NOTE — Assessment & Plan Note (Signed)
Check lipids. Continue statin.

## 2019-03-06 NOTE — Assessment & Plan Note (Signed)
BP well controlled Current regimen effective and well tolerated Continue current medications at current doses cmp  

## 2019-03-06 NOTE — Patient Instructions (Signed)
  Tests ordered today. Your results will be released to [facility] (or called to you) after review.  If any changes need to be made, you will be notified at that same time.   Medications reviewed and updated.  Changes include :   none

## 2019-03-07 ENCOUNTER — Encounter: Payer: Self-pay | Admitting: Internal Medicine

## 2019-03-12 ENCOUNTER — Telehealth: Payer: Self-pay | Admitting: Internal Medicine

## 2019-03-12 NOTE — Telephone Encounter (Signed)
Pt requested an answer by 5:00 PM.

## 2019-03-12 NOTE — Telephone Encounter (Signed)
Pt states he was told at his last OV 05-2018 by Dr Hilarie Fredrickson, he can gave a sample Plenvu or Suprep for a bowel purge due to chronic constipation- He has his Suprep from his 9-3 PV- this is not PV related according to the pt-  Do we give him preps like this?   Please see last OV note- Lelan Pons

## 2019-03-12 NOTE — Telephone Encounter (Signed)
Pt called today . Please advise when this patient may pick up prep.

## 2019-03-12 NOTE — Telephone Encounter (Signed)
If patient was told he can have prep, this should go to previsit

## 2019-03-13 NOTE — Telephone Encounter (Signed)
Late entry: Suprep placed at front desk. Patient aware.

## 2019-03-14 ENCOUNTER — Other Ambulatory Visit: Payer: Self-pay | Admitting: Urology

## 2019-03-14 ENCOUNTER — Other Ambulatory Visit: Payer: Self-pay | Admitting: Endocrinology

## 2019-03-20 ENCOUNTER — Encounter: Payer: Medicare Other | Admitting: Internal Medicine

## 2019-03-20 ENCOUNTER — Encounter (HOSPITAL_COMMUNITY): Payer: Self-pay | Admitting: *Deleted

## 2019-03-20 ENCOUNTER — Ambulatory Visit (HOSPITAL_COMMUNITY)
Admission: RE | Admit: 2019-03-20 | Discharge: 2019-03-20 | Disposition: A | Discharge status: Home / Self Care | Payer: Medicare Other | Attending: Urology | Admitting: Urology

## 2019-03-20 ENCOUNTER — Ambulatory Visit (HOSPITAL_COMMUNITY): Payer: Medicare Other

## 2019-03-20 ENCOUNTER — Encounter (HOSPITAL_COMMUNITY)
Admission: RE | Disposition: A | Discharge status: Home / Self Care | Payer: Self-pay | Source: Home / Self Care | Attending: Urology

## 2019-03-20 DIAGNOSIS — Z7984 Long term (current) use of oral hypoglycemic drugs: Secondary | ICD-10-CM | POA: Insufficient documentation

## 2019-03-20 DIAGNOSIS — I10 Essential (primary) hypertension: Secondary | ICD-10-CM | POA: Insufficient documentation

## 2019-03-20 DIAGNOSIS — N201 Calculus of ureter: Secondary | ICD-10-CM | POA: Insufficient documentation

## 2019-03-20 DIAGNOSIS — E119 Type 2 diabetes mellitus without complications: Secondary | ICD-10-CM | POA: Diagnosis not present

## 2019-03-20 DIAGNOSIS — Z79899 Other long term (current) drug therapy: Secondary | ICD-10-CM | POA: Insufficient documentation

## 2019-03-20 DIAGNOSIS — N200 Calculus of kidney: Secondary | ICD-10-CM

## 2019-03-20 HISTORY — PX: EXTRACORPOREAL SHOCK WAVE LITHOTRIPSY: SHX1557

## 2019-03-20 LAB — GLUCOSE, CAPILLARY: Glucose-Capillary: 141 mg/dL — ABNORMAL HIGH (ref 70–99)

## 2019-03-20 SURGERY — LITHOTRIPSY, ESWL
Anesthesia: LOCAL | Laterality: Left

## 2019-03-20 MED ORDER — CIPROFLOXACIN HCL 500 MG PO TABS
500.0000 mg | ORAL_TABLET | ORAL | Status: AC
  Administered 2019-03-20: 500 mg via ORAL
  Filled 2019-03-20: qty 1

## 2019-03-20 MED ORDER — OXYCODONE-ACETAMINOPHEN 5-325 MG PO TABS: 1.0000 | ORAL_TABLET | ORAL | 0 refills | Status: DC | PRN

## 2019-03-20 MED ORDER — DIPHENHYDRAMINE HCL 25 MG PO CAPS
25.0000 mg | ORAL_CAPSULE | ORAL | Status: AC
  Administered 2019-03-20: 25 mg via ORAL
  Filled 2019-03-20: qty 1

## 2019-03-20 MED ORDER — SODIUM CHLORIDE 0.9 % IV SOLN
INTRAVENOUS | Status: DC
  Administered 2019-03-20: 07:00:00 via INTRAVENOUS

## 2019-03-20 MED ORDER — DIAZEPAM 5 MG PO TABS
10.0000 mg | ORAL_TABLET | ORAL | Status: AC
  Administered 2019-03-20: 10 mg via ORAL
  Filled 2019-03-20: qty 2

## 2019-03-20 MED ORDER — TAMSULOSIN HCL 0.4 MG PO CAPS: 0.4000 mg | ORAL_CAPSULE | Freq: Every day | ORAL | 0 refills | Status: DC

## 2019-03-20 NOTE — Discharge Instructions (Signed)
Lithotripsy, Care After °This sheet gives you information about how to care for yourself after your procedure. Your health care provider may also give you more specific instructions. If you have problems or questions, contact your health care provider. °What can I expect after the procedure? °After the procedure, it is common to have: °· Some blood in your urine. This should only last for a few days. °· Soreness in your back, sides, or upper abdomen for a few days. °· Blotches or bruises on your back where the pressure wave entered the skin. °· Pain, discomfort, or nausea when pieces (fragments) of the kidney stone move through the tube that carries urine from the kidney to the bladder (ureter). Stone fragments may pass soon after the procedure, but they may continue to pass for up to 4-8 weeks. °? If you have severe pain or nausea, contact your health care provider. This may be caused by a large stone that was not broken up, and this may mean that you need more treatment. °· Some pain or discomfort during urination. °· Some pain or discomfort in the lower abdomen or (in men) at the base of the penis. °Follow these instructions at home: °Medicines °· Take over-the-counter and prescription medicines only as told by your health care provider. °· If you were prescribed an antibiotic medicine, take it as told by your health care provider. Do not stop taking the antibiotic even if you start to feel better. °· Do not drive for 24 hours if you were given a medicine to help you relax (sedative). °· Do not drive or use heavy machinery while taking prescription pain medicine. °Eating and drinking ° °  ° °· Drink enough water and fluids to keep your urine clear or pale yellow. This helps any remaining pieces of the stone to pass. It can also help prevent new stones from forming. °· Eat plenty of fresh fruits and vegetables. °· Follow instructions from your health care provider about eating and drinking restrictions. You may be  instructed: °? To reduce how much salt (sodium) you eat or drink. Check ingredients and nutrition facts on packaged foods and beverages. °? To reduce how much meat you eat. °· Eat the recommended amount of calcium for your age and gender. Ask your health care provider how much calcium you should have. °General instructions °· Get plenty of rest. °· Most people can resume normal activities 1-2 days after the procedure. Ask your health care provider what activities are safe for you. °· Your health care provider may direct you to lie in a certain position (postural drainage) and tap firmly (percuss) over your kidney area to help stone fragments pass. Follow instructions as told by your health care provider. °· If directed, strain all urine through the strainer that was provided by your health care provider. °? Keep all fragments for your health care provider to see. Any stones that are found may be sent to a medical lab for examination. The stone may be as small as a grain of salt. °· Keep all follow-up visits as told by your health care provider. This is important. °Contact a health care provider if: °· You have pain that is severe or does not get better with medicine. °· You have nausea that is severe or does not go away. °· You have blood in your urine longer than your health care provider told you to expect. °· You have more blood in your urine. °· You have pain during urination that does   not go away.  You urinate more frequently than usual and this does not go away.  You develop a rash or any other possible signs of an allergic reaction. Get help right away if:  You have severe pain in your back, sides, or upper abdomen.  You have severe pain while urinating.  Your urine is very dark red.  You have blood in your stool (feces).  You cannot pass any urine at all.  You feel a strong urge to urinate after emptying your bladder.  You have a fever or chills.  You develop shortness of breath,  difficulty breathing, or chest pain.  You have severe nausea that leads to persistent vomiting.  You faint. Summary  After this procedure, it is common to have some pain, discomfort, or nausea when pieces (fragments) of the kidney stone move through the tube that carries urine from the kidney to the bladder (ureter). If this pain or nausea is severe, however, you should contact your health care provider.  Most people can resume normal activities 1-2 days after the procedure. Ask your health care provider what activities are safe for you.  Drink enough water and fluids to keep your urine clear or pale yellow. This helps any remaining pieces of the stone to pass, and it can help prevent new stones from forming.  If directed, strain your urine and keep all fragments for your health care provider to see. Fragments or stones may be as small as a grain of salt.  Get help right away if you have severe pain in your back, sides, or upper abdomen or have severe pain while urinating. This information is not intended to replace advice given to you by your health care provider. Make sure you discuss any questions you have with your health care provider. Document Released: 07/09/2007 Document Revised: 09/30/2018 Document Reviewed: 05/10/2016 Elsevier Patient Education  2020 [employer].

## 2019-03-21 ENCOUNTER — Encounter (HOSPITAL_COMMUNITY): Payer: Self-pay | Admitting: Urology

## 2019-03-24 ENCOUNTER — Emergency Department (HOSPITAL_COMMUNITY): Payer: Medicare Other

## 2019-03-24 ENCOUNTER — Inpatient Hospital Stay (HOSPITAL_COMMUNITY)
Admission: EM | Admit: 2019-03-24 | Discharge: 2019-03-29 | DRG: 193 | Disposition: A | Discharge status: Home / Self Care | Payer: Medicare Other | Attending: Internal Medicine | Admitting: Internal Medicine

## 2019-03-24 ENCOUNTER — Other Ambulatory Visit: Payer: Medicare Other

## 2019-03-24 ENCOUNTER — Telehealth: Payer: Self-pay

## 2019-03-24 ENCOUNTER — Encounter (HOSPITAL_COMMUNITY): Payer: Self-pay

## 2019-03-24 ENCOUNTER — Ambulatory Visit: Payer: Self-pay | Admitting: *Deleted

## 2019-03-24 ENCOUNTER — Other Ambulatory Visit: Payer: Self-pay

## 2019-03-24 DIAGNOSIS — I10 Essential (primary) hypertension: Secondary | ICD-10-CM | POA: Diagnosis not present

## 2019-03-24 DIAGNOSIS — N189 Chronic kidney disease, unspecified: Secondary | ICD-10-CM | POA: Diagnosis present

## 2019-03-24 DIAGNOSIS — I82441 Acute embolism and thrombosis of right tibial vein: Secondary | ICD-10-CM | POA: Diagnosis present

## 2019-03-24 DIAGNOSIS — Y95 Nosocomial condition: Secondary | ICD-10-CM | POA: Diagnosis present

## 2019-03-24 DIAGNOSIS — E785 Hyperlipidemia, unspecified: Secondary | ICD-10-CM | POA: Diagnosis present

## 2019-03-24 DIAGNOSIS — I82409 Acute embolism and thrombosis of unspecified deep veins of unspecified lower extremity: Secondary | ICD-10-CM | POA: Diagnosis not present

## 2019-03-24 DIAGNOSIS — E782 Mixed hyperlipidemia: Secondary | ICD-10-CM | POA: Diagnosis not present

## 2019-03-24 DIAGNOSIS — M109 Gout, unspecified: Secondary | ICD-10-CM | POA: Diagnosis present

## 2019-03-24 DIAGNOSIS — I130 Hypertensive heart and chronic kidney disease with heart failure and stage 1 through stage 4 chronic kidney disease, or unspecified chronic kidney disease: Secondary | ICD-10-CM | POA: Diagnosis present

## 2019-03-24 DIAGNOSIS — Z7982 Long term (current) use of aspirin: Secondary | ICD-10-CM

## 2019-03-24 DIAGNOSIS — E872 Acidosis: Secondary | ICD-10-CM | POA: Diagnosis not present

## 2019-03-24 DIAGNOSIS — Z87891 Personal history of nicotine dependence: Secondary | ICD-10-CM | POA: Diagnosis not present

## 2019-03-24 DIAGNOSIS — N179 Acute kidney failure, unspecified: Secondary | ICD-10-CM | POA: Diagnosis present

## 2019-03-24 DIAGNOSIS — Z800 Family history of malignant neoplasm of digestive organs: Secondary | ICD-10-CM | POA: Diagnosis not present

## 2019-03-24 DIAGNOSIS — E1129 Type 2 diabetes mellitus with other diabetic kidney complication: Secondary | ICD-10-CM | POA: Diagnosis present

## 2019-03-24 DIAGNOSIS — R0902 Hypoxemia: Secondary | ICD-10-CM

## 2019-03-24 DIAGNOSIS — Z7984 Long term (current) use of oral hypoglycemic drugs: Secondary | ICD-10-CM | POA: Diagnosis not present

## 2019-03-24 DIAGNOSIS — D631 Anemia in chronic kidney disease: Secondary | ICD-10-CM | POA: Diagnosis present

## 2019-03-24 DIAGNOSIS — N183 Chronic kidney disease, stage 3 (moderate): Secondary | ICD-10-CM | POA: Diagnosis present

## 2019-03-24 DIAGNOSIS — R911 Solitary pulmonary nodule: Secondary | ICD-10-CM | POA: Diagnosis present

## 2019-03-24 DIAGNOSIS — K589 Irritable bowel syndrome without diarrhea: Secondary | ICD-10-CM | POA: Diagnosis present

## 2019-03-24 DIAGNOSIS — E669 Obesity, unspecified: Secondary | ICD-10-CM | POA: Diagnosis present

## 2019-03-24 DIAGNOSIS — K5790 Diverticulosis of intestine, part unspecified, without perforation or abscess without bleeding: Secondary | ICD-10-CM | POA: Diagnosis present

## 2019-03-24 DIAGNOSIS — R609 Edema, unspecified: Secondary | ICD-10-CM | POA: Diagnosis not present

## 2019-03-24 DIAGNOSIS — G894 Chronic pain syndrome: Secondary | ICD-10-CM | POA: Diagnosis present

## 2019-03-24 DIAGNOSIS — Z833 Family history of diabetes mellitus: Secondary | ICD-10-CM | POA: Diagnosis not present

## 2019-03-24 DIAGNOSIS — Z888 Allergy status to other drugs, medicaments and biological substances status: Secondary | ICD-10-CM | POA: Diagnosis not present

## 2019-03-24 DIAGNOSIS — J189 Pneumonia, unspecified organism: Principal | ICD-10-CM | POA: Diagnosis present

## 2019-03-24 DIAGNOSIS — I82432 Acute embolism and thrombosis of left popliteal vein: Secondary | ICD-10-CM | POA: Diagnosis present

## 2019-03-24 DIAGNOSIS — Z6832 Body mass index (BMI) 32.0-32.9, adult: Secondary | ICD-10-CM

## 2019-03-24 DIAGNOSIS — R0989 Other specified symptoms and signs involving the circulatory and respiratory systems: Secondary | ICD-10-CM | POA: Diagnosis not present

## 2019-03-24 DIAGNOSIS — N132 Hydronephrosis with renal and ureteral calculous obstruction: Secondary | ICD-10-CM | POA: Diagnosis present

## 2019-03-24 DIAGNOSIS — I5032 Chronic diastolic (congestive) heart failure: Secondary | ICD-10-CM | POA: Diagnosis present

## 2019-03-24 DIAGNOSIS — E1122 Type 2 diabetes mellitus with diabetic chronic kidney disease: Secondary | ICD-10-CM | POA: Diagnosis present

## 2019-03-24 DIAGNOSIS — J9601 Acute respiratory failure with hypoxia: Secondary | ICD-10-CM | POA: Diagnosis present

## 2019-03-24 DIAGNOSIS — N1330 Unspecified hydronephrosis: Secondary | ICD-10-CM | POA: Diagnosis not present

## 2019-03-24 DIAGNOSIS — I82403 Acute embolism and thrombosis of unspecified deep veins of lower extremity, bilateral: Secondary | ICD-10-CM | POA: Diagnosis not present

## 2019-03-24 DIAGNOSIS — Z20828 Contact with and (suspected) exposure to other viral communicable diseases: Secondary | ICD-10-CM | POA: Diagnosis present

## 2019-03-24 DIAGNOSIS — Z8042 Family history of malignant neoplasm of prostate: Secondary | ICD-10-CM | POA: Diagnosis not present

## 2019-03-24 DIAGNOSIS — K581 Irritable bowel syndrome with constipation: Secondary | ICD-10-CM | POA: Diagnosis not present

## 2019-03-24 DIAGNOSIS — I361 Nonrheumatic tricuspid (valve) insufficiency: Secondary | ICD-10-CM | POA: Diagnosis not present

## 2019-03-24 DIAGNOSIS — Z8249 Family history of ischemic heart disease and other diseases of the circulatory system: Secondary | ICD-10-CM

## 2019-03-24 LAB — CBC WITH DIFFERENTIAL/PLATELET
Abs Immature Granulocytes: 0.03 10*3/uL (ref 0.00–0.07)
Basophils Absolute: 0 10*3/uL (ref 0.0–0.1)
Basophils Relative: 0 %
Eosinophils Absolute: 0 10*3/uL (ref 0.0–0.5)
Eosinophils Relative: 0 %
HCT: 40.8 % (ref 39.0–52.0)
Hemoglobin: 12.3 g/dL — ABNORMAL LOW (ref 13.0–17.0)
Immature Granulocytes: 0 %
Lymphocytes Relative: 11 %
Lymphs Abs: 0.9 10*3/uL (ref 0.7–4.0)
MCH: 26.6 pg (ref 26.0–34.0)
MCHC: 30.1 g/dL (ref 30.0–36.0)
MCV: 88.3 fL (ref 80.0–100.0)
Monocytes Absolute: 0.6 10*3/uL (ref 0.1–1.0)
Monocytes Relative: 7 %
Neutro Abs: 6.5 10*3/uL (ref 1.7–7.7)
Neutrophils Relative %: 82 %
Platelets: 256 10*3/uL (ref 150–400)
RBC: 4.62 MIL/uL (ref 4.22–5.81)
RDW: 13.2 % (ref 11.5–15.5)
WBC: 8 10*3/uL (ref 4.0–10.5)
nRBC: 0 % (ref 0.0–0.2)

## 2019-03-24 LAB — URINALYSIS, ROUTINE W REFLEX MICROSCOPIC
Bilirubin Urine: NEGATIVE
Glucose, UA: NEGATIVE mg/dL
Ketones, ur: 5 mg/dL — AB
Leukocytes,Ua: NEGATIVE
Nitrite: NEGATIVE
Protein, ur: 100 mg/dL — AB
Specific Gravity, Urine: 1.02 (ref 1.005–1.030)
pH: 5 (ref 5.0–8.0)

## 2019-03-24 LAB — SARS CORONAVIRUS 2 BY RT PCR (HOSPITAL ORDER, PERFORMED IN ~~LOC~~ HOSPITAL LAB): SARS Coronavirus 2: NEGATIVE

## 2019-03-24 LAB — BASIC METABOLIC PANEL
Anion gap: 11 (ref 5–15)
BUN: 39 mg/dL — ABNORMAL HIGH (ref 8–23)
CO2: 19 mmol/L — ABNORMAL LOW (ref 22–32)
Calcium: 9.7 mg/dL (ref 8.9–10.3)
Chloride: 109 mmol/L (ref 98–111)
Creatinine, Ser: 3.15 mg/dL — ABNORMAL HIGH (ref 0.61–1.24)
GFR calc Af Amer: 22 mL/min — ABNORMAL LOW (ref >60)
GFR calc non Af Amer: 19 mL/min — ABNORMAL LOW (ref >60)
Glucose, Bld: 140 mg/dL — ABNORMAL HIGH (ref 70–99)
Potassium: 5.1 mmol/L (ref 3.5–5.1)
Sodium: 139 mmol/L (ref 135–145)

## 2019-03-24 LAB — HEPATIC FUNCTION PANEL
ALT: 14 U/L (ref 0–44)
AST: 10 U/L — ABNORMAL LOW (ref 15–41)
Albumin: 2.7 g/dL — ABNORMAL LOW (ref 3.5–5.0)
Alkaline Phosphatase: 46 U/L (ref 38–126)
Bilirubin, Direct: 0.1 mg/dL (ref 0.0–0.2)
Indirect Bilirubin: 0.7 mg/dL (ref 0.3–0.9)
Total Bilirubin: 0.8 mg/dL (ref 0.3–1.2)
Total Protein: 5.9 g/dL — ABNORMAL LOW (ref 6.5–8.1)

## 2019-03-24 LAB — BRAIN NATRIURETIC PEPTIDE: B Natriuretic Peptide: 204.7 pg/mL — ABNORMAL HIGH (ref 0.0–100.0)

## 2019-03-24 LAB — TROPONIN I (HIGH SENSITIVITY)
Troponin I (High Sensitivity): 26 ng/L — ABNORMAL HIGH (ref <18)
Troponin I (High Sensitivity): 21 ng/L — ABNORMAL HIGH (ref <18)

## 2019-03-24 LAB — D-DIMER, QUANTITATIVE: D-Dimer, Quant: 11.94 ug/mL-FEU — ABNORMAL HIGH (ref 0.00–0.50)

## 2019-03-24 MED ORDER — GLIMEPIRIDE 2 MG PO TABS
2.0000 mg | ORAL_TABLET | Freq: Every day | ORAL | Status: DC
  Administered 2019-03-25 – 2019-03-26 (×2): 2 mg via ORAL
  Filled 2019-03-24 (×2): qty 1

## 2019-03-24 MED ORDER — TAMSULOSIN HCL 0.4 MG PO CAPS
0.4000 mg | ORAL_CAPSULE | Freq: Every day | ORAL | Status: DC
  Administered 2019-03-25 – 2019-03-28 (×4): 0.4 mg via ORAL
  Filled 2019-03-24 (×4): qty 1

## 2019-03-24 MED ORDER — INSULIN ASPART 100 UNIT/ML ~~LOC~~ SOLN
0.0000 [IU] | Freq: Three times a day (TID) | SUBCUTANEOUS | Status: DC
  Administered 2019-03-25 – 2019-03-26 (×4): 1 [IU] via SUBCUTANEOUS
  Administered 2019-03-27 – 2019-03-28 (×3): 2 [IU] via SUBCUTANEOUS
  Administered 2019-03-28: 1 [IU] via SUBCUTANEOUS
  Administered 2019-03-28: 2 [IU] via SUBCUTANEOUS
  Administered 2019-03-29: 1 [IU] via SUBCUTANEOUS
  Administered 2019-03-29: 2 [IU] via SUBCUTANEOUS

## 2019-03-24 MED ORDER — SODIUM CHLORIDE 0.9 % IV SOLN
INTRAVENOUS | Status: DC
  Administered 2019-03-25 – 2019-03-26 (×4): via INTRAVENOUS

## 2019-03-24 MED ORDER — HYDRALAZINE HCL 25 MG PO TABS
25.0000 mg | ORAL_TABLET | Freq: Two times a day (BID) | ORAL | Status: DC
  Administered 2019-03-25 – 2019-03-28 (×9): 25 mg via ORAL
  Filled 2019-03-24 (×9): qty 1

## 2019-03-24 MED ORDER — SODIUM CHLORIDE 0.9 % IV SOLN
2.0000 g | Freq: Once | INTRAVENOUS | Status: AC
  Administered 2019-03-24: 2 g via INTRAVENOUS
  Filled 2019-03-24: qty 2

## 2019-03-24 MED ORDER — METOPROLOL TARTRATE 50 MG PO TABS
100.0000 mg | ORAL_TABLET | Freq: Two times a day (BID) | ORAL | Status: DC
  Administered 2019-03-25 – 2019-03-29 (×10): 100 mg via ORAL
  Filled 2019-03-24 (×10): qty 2

## 2019-03-24 MED ORDER — ATORVASTATIN CALCIUM 20 MG PO TABS
20.0000 mg | ORAL_TABLET | Freq: Every day | ORAL | Status: DC
  Administered 2019-03-25 – 2019-03-29 (×5): 20 mg via ORAL
  Filled 2019-03-24 (×5): qty 1

## 2019-03-24 MED ORDER — ALLOPURINOL 300 MG PO TABS
300.0000 mg | ORAL_TABLET | Freq: Every day | ORAL | Status: DC
  Administered 2019-03-25 – 2019-03-29 (×5): 300 mg via ORAL
  Filled 2019-03-24 (×5): qty 1

## 2019-03-24 MED ORDER — HEPARIN SODIUM (PORCINE) 5000 UNIT/ML IJ SOLN
5000.0000 [IU] | Freq: Three times a day (TID) | INTRAMUSCULAR | Status: DC
  Administered 2019-03-25 – 2019-03-26 (×4): 5000 [IU] via SUBCUTANEOUS
  Filled 2019-03-24 (×4): qty 1

## 2019-03-24 MED ORDER — LINACLOTIDE 145 MCG PO CAPS
290.0000 ug | ORAL_CAPSULE | Freq: Every day | ORAL | Status: DC
  Administered 2019-03-25 – 2019-03-29 (×5): 290 ug via ORAL
  Filled 2019-03-24 (×5): qty 2
  Filled 2019-03-24: qty 1

## 2019-03-24 MED ORDER — CLONIDINE HCL 0.1 MG PO TABS
0.1000 mg | ORAL_TABLET | Freq: Two times a day (BID) | ORAL | Status: DC
  Administered 2019-03-25 – 2019-03-29 (×10): 0.1 mg via ORAL
  Filled 2019-03-24 (×10): qty 1

## 2019-03-24 MED ORDER — SODIUM CHLORIDE 0.9 % IV SOLN
1.0000 g | Freq: Once | INTRAVENOUS | Status: DC
  Filled 2019-03-24: qty 10

## 2019-03-24 MED ORDER — OXYCODONE-ACETAMINOPHEN 5-325 MG PO TABS
1.0000 | ORAL_TABLET | ORAL | Status: DC | PRN
  Administered 2019-03-25 – 2019-03-29 (×12): 1 via ORAL
  Filled 2019-03-24 (×12): qty 1

## 2019-03-24 MED ORDER — ASPIRIN EC 81 MG PO TBEC
81.0000 mg | DELAYED_RELEASE_TABLET | Freq: Every morning | ORAL | Status: DC
  Administered 2019-03-26 – 2019-03-29 (×4): 81 mg via ORAL
  Filled 2019-03-24 (×5): qty 1

## 2019-03-24 MED ORDER — SODIUM CHLORIDE 0.9 % IV SOLN
500.0000 mg | Freq: Once | INTRAVENOUS | Status: AC
  Administered 2019-03-24: 500 mg via INTRAVENOUS
  Filled 2019-03-24: qty 500

## 2019-03-24 MED ORDER — TRAMADOL HCL 50 MG PO TABS: 50.0000 mg | ORAL_TABLET | Freq: Two times a day (BID) | ORAL | Status: DC | PRN

## 2019-03-24 MED ORDER — INSULIN ASPART 100 UNIT/ML ~~LOC~~ SOLN: 0.0000 [IU] | Freq: Every day | SUBCUTANEOUS | Status: DC

## 2019-03-24 MED ORDER — VANCOMYCIN HCL 10 G IV SOLR
2000.0000 mg | Freq: Once | INTRAVENOUS | Status: AC
  Administered 2019-03-24: 2000 mg via INTRAVENOUS
  Filled 2019-03-24: qty 2000

## 2019-03-24 MED ORDER — LEVOFLOXACIN IN D5W 750 MG/150ML IV SOLN
750.0000 mg | INTRAVENOUS | Status: DC
  Filled 2019-03-24: qty 150

## 2019-03-24 NOTE — Telephone Encounter (Signed)
I guess this is an FYI?

## 2019-03-24 NOTE — ED Notes (Signed)
ED TO INPATIENT HANDOFF REPORT  ED Nurse Name and Phone #: Gara Kroner, RN  S Name/Age/Gender Noah Barnes 73 y.o. male Room/Bed: WA20/WA20  Code Status   Code Status: Prior  Home/SNF/Other Home Patient oriented to: self, place, time and situation Is this baseline? Yes   Triage Complete: Triage complete  Chief Complaint rt side chest congestion  Triage Note Pt presents with c/o dry cough and some right sided rib cage pain for the past day. Pt reports the pain in his rib cage is more severe with movement. Pt did have a lithotripsy done this past Thursday.   Allergies Allergies  Allergen Reactions  . Ace Inhibitors Other (See Comments)    Severe AKI due to ARB + dehydration Aug 2016  . Angiotensin Receptor Blockers Other (See Comments)    Severe AKI due to ARB + dehydration Aug 2016  . Losartan Other (See Comments)    Severe AKI due to ARB + dehydration Aug 2016    Level of Care/Admitting Diagnosis ED Disposition    ED Disposition Condition Comment   Admit  Hospital Area: [facility] [[id number]]  Level of Care: Med-Surg [16]  Covid Evaluation: Confirmed COVID Negative  Diagnosis: HCAP (healthcare-associated pneumonia) [[id number]]  Admitting Physician: Elwyn Reach [[id number]]  Attending Physician: Elwyn Reach [[id number]]  Estimated length of stay: past midnight tomorrow  Certification:: I certify this patient will need inpatient services for at least 2 midnights  PT Class (Do Not Modify): Inpatient [101]  PT Acc Code (Do Not Modify): Private [1]       B Medical/Surgery History Past Medical History:  Diagnosis Date  . CHF (congestive heart failure) ([place])   . Chronic diastolic heart failure ([place])   . Constipation   . Diabetes mellitus   . Diverticulosis   . ED (erectile dysfunction)   . Gout   . Heart murmur   . Hyperlipidemia   . Hypertension   . IBS (irritable bowel syndrome)   . Renal calculus   . Renal insufficiency   . Tubular  adenoma of colon    Past Surgical History:  Procedure Laterality Date  . COLONOSCOPY  1998   negative; no F/U  . EXTRACORPOREAL SHOCK WAVE LITHOTRIPSY Left 03/20/2019   Procedure: EXTRACORPOREAL SHOCK WAVE LITHOTRIPSY (ESWL);  Surgeon: Cleon Gustin, MD;  Location: [facility];  Service: Urology;  Laterality: Left;  . knee effusion tapped       A IV Location/Drains/Wounds Patient Lines/Drains/Airways Status   Active Line/Drains/Airways    Name:   Placement date:   Placement time:   Site:   Days:   Peripheral IV 03/24/19 Right Forearm   03/24/19    -    Forearm   less than 1          Intake/Output Last 24 hours No intake or output data in the 24 hours ending 03/24/19 2320  Labs/Imaging Results for orders placed or performed during the hospital encounter of 03/24/19 (from the past 48 hour(s))  CBC with Differential     Status: Abnormal   Collection Time: 03/24/19  4:07 PM  Result Value Ref Range   WBC 8.0 4.0 - 10.5 K/uL   RBC 4.62 4.22 - 5.81 MIL/uL   Hemoglobin 12.3 (L) 13.0 - 17.0 g/dL   HCT 40.8 39.0 - 52.0 %   MCV 88.3 80.0 - 100.0 fL   MCH 26.6 26.0 - 34.0 pg   MCHC 30.1 30.0 - 36.0 g/dL   RDW 13.2  11.5 - 15.5 %   Platelets 256 150 - 400 K/uL   nRBC 0.0 0.0 - 0.2 %   Neutrophils Relative % 82 %   Neutro Abs 6.5 1.7 - 7.7 K/uL   Lymphocytes Relative 11 %   Lymphs Abs 0.9 0.7 - 4.0 K/uL   Monocytes Relative 7 %   Monocytes Absolute 0.6 0.1 - 1.0 K/uL   Eosinophils Relative 0 %   Eosinophils Absolute 0.0 0.0 - 0.5 K/uL   Basophils Relative 0 %   Basophils Absolute 0.0 0.0 - 0.1 K/uL   Immature Granulocytes 0 %   Abs Immature Granulocytes 0.03 0.00 - 0.07 K/uL    Comment: Performed at [facility], [place] [address], [place], [place] [address]  Basic metabolic panel     Status: Abnormal   Collection Time: 03/24/19  4:07 PM  Result Value Ref Range   Sodium 139 135 - 145 mmol/L   Potassium 5.1 3.5 - 5.1 mmol/L   Chloride 109 98 - 111 mmol/L    CO2 19 (L) 22 - 32 mmol/L   Glucose, Bld 140 (H) 70 - 99 mg/dL   BUN 39 (H) 8 - 23 mg/dL   Creatinine, Ser 3.15 (H) 0.61 - 1.24 mg/dL   Calcium 9.7 8.9 - 10.3 mg/dL   GFR calc non Af Amer 19 (L) >60 mL/min   GFR calc Af Amer 22 (L) >60 mL/min   Anion gap 11 5 - 15    Comment: Performed at [facility], [place] [address], [place], [place] [address]  Troponin I (High Sensitivity)     Status: Abnormal   Collection Time: 03/24/19  4:07 PM  Result Value Ref Range   Troponin I (High Sensitivity) 26 (H) <18 ng/L    Comment: (NOTE) Elevated high sensitivity troponin I (hsTnI) values and significant  changes across serial measurements may suggest ACS but many other  chronic and acute conditions are known to elevate hsTnI results.  Refer to the "Links" section for chest pain algorithms and additional  guidance. Performed at [facility], [place] [address] [place], [place] [address]   D-dimer, quantitative (not at [facility])     Status: Abnormal   Collection Time: 03/24/19  4:07 PM  Result Value Ref Range   D-Dimer, Quant 11.94 (H) 0.00 - 0.50 ug/mL-FEU    Comment: (NOTE) At the manufacturer cut-off of 0.50 ug/mL FEU, this assay has been documented to exclude PE with a sensitivity and negative predictive value of 97 to 99%.  At this time, this assay has not been approved by the FDA to exclude DVT/VTE. Results should be correlated with clinical presentation. Performed at [facility], [place] [address] [place], [place] [address]   Brain natriuretic peptide     Status: Abnormal   Collection Time: 03/24/19  4:08 PM  Result Value Ref Range   B Natriuretic Peptide 204.7 (H) 0.0 - 100.0 pg/mL    Comment: Performed at [facility], [place] [address], [place], [place] [address]  SARS Coronavirus 2 [facility] order, Performed in [facility] hospital lab) Nasopharyngeal Nasopharyngeal Swab     Status: None   Collection Time: 03/24/19   4:08 PM   Specimen: Nasopharyngeal Swab  Result Value Ref Range   SARS Coronavirus 2 NEGATIVE NEGATIVE    Comment: (NOTE) If result is NEGATIVE SARS-CoV-2 target nucleic acids are NOT DETECTED. The SARS-CoV-2 RNA is generally detectable in upper and lower  respiratory specimens during the  acute phase of infection. The lowest  concentration of SARS-CoV-2 viral copies this assay can detect is 250  copies / mL. A negative result does not preclude SARS-CoV-2 infection  and should not be used as the sole basis for treatment or other  patient management decisions.  A negative result may occur with  improper specimen collection / handling, submission of specimen other  than nasopharyngeal swab, presence of viral mutation(s) within the  areas targeted by this assay, and inadequate number of viral copies  (<250 copies / mL). A negative result must be combined with clinical  observations, patient history, and epidemiological information. If result is POSITIVE SARS-CoV-2 target nucleic acids are DETECTED. The SARS-CoV-2 RNA is generally detectable in upper and lower  respiratory specimens dur ing the acute phase of infection.  Positive  results are indicative of active infection with SARS-CoV-2.  Clinical  correlation with patient history and other diagnostic information is  necessary to determine patient infection status.  Positive results do  not rule out bacterial infection or co-infection with other viruses. If result is PRESUMPTIVE POSTIVE SARS-CoV-2 nucleic acids MAY BE PRESENT.   A presumptive positive result was obtained on the submitted specimen  and confirmed on repeat testing.  While 2019 novel coronavirus  (SARS-CoV-2) nucleic acids may be present in the submitted sample  additional confirmatory testing may be necessary for epidemiological  and / or clinical management purposes  to differentiate between  SARS-CoV-2 and other Sarbecovirus currently known to infect humans.  If  clinically indicated additional testing with an alternate test  methodology [phone]) is advised. The SARS-CoV-2 RNA is generally  detectable in upper and lower respiratory sp ecimens during the acute  phase of infection. The expected result is Negative. Fact Sheet for Patients:  StrictlyIdeas.no Fact Sheet for Healthcare Providers: BankingDealers.co.za This test is not yet approved or cleared by the [place] FDA and has been authorized for detection and/or diagnosis of SARS-CoV-2 by FDA under an Emergency Use Authorization (EUA).  This EUA will remain in effect (meaning this test can be used) for the duration of the COVID-19 declaration under Section 564(b)(1) of the Act, 21 U.S.C. section 360bbb-3(b)(1), unless the authorization is terminated or revoked sooner. Performed at [facility], [place] [address] [place], [place] [address]   Troponin I (High Sensitivity)     Status: Abnormal   Collection Time: 03/24/19  5:52 PM  Result Value Ref Range   Troponin I (High Sensitivity) 21 (H) <18 ng/L    Comment: (NOTE) Elevated high sensitivity troponin I (hsTnI) values and significant  changes across serial measurements may suggest ACS but many other  chronic and acute conditions are known to elevate hsTnI results.  Refer to the "Links" section for chest pain algorithms and additional  guidance. Performed at [facility], [place] [address] [place],  [address]   Hepatic function panel     Status: Abnormal   Collection Time: 03/24/19  5:52 PM  Result Value Ref Range   Total Protein 5.9 (L) 6.5 - 8.1 g/dL   Albumin 2.7 (L) 3.5 - 5.0 g/dL   AST 10 (L) 15 - 41 U/L   ALT 14 0 - 44 U/L   Alkaline Phosphatase 46 38 - 126 U/L   Total Bilirubin 0.8 0.3 - 1.2 mg/dL   Bilirubin, Direct 0.1 0.0 - 0.2 mg/dL   Indirect Bilirubin 0.7 0.3 - 0.9 mg/dL    Comment: Performed at [facility],  [place]  [address] [place], [place] [address]  Urinalysis, Routine w reflex microscopic     Status: Abnormal   Collection Time: 03/24/19  7:17 PM  Result Value Ref Range   Color, Urine YELLOW YELLOW   APPearance HAZY (A) CLEAR   Specific Gravity, Urine 1.020 1.005 - 1.030   pH 5.0 5.0 - 8.0   Glucose, UA NEGATIVE NEGATIVE mg/dL   Hgb urine dipstick SMALL (A) NEGATIVE   Bilirubin Urine NEGATIVE NEGATIVE   Ketones, ur 5 (A) NEGATIVE mg/dL   Protein, ur 100 (A) NEGATIVE mg/dL   Nitrite NEGATIVE NEGATIVE   Leukocytes,Ua NEGATIVE NEGATIVE   RBC / HPF 11-20 0 - 5 RBC/hpf   WBC, UA 6-10 0 - 5 WBC/hpf   Bacteria, UA RARE (A) NONE SEEN   Squamous Epithelial / LPF 0-5 0 - 5    Comment: Performed at [facility], [place] [address] [place], [place] [address]   Ct Chest Wo Contrast  Result Date: 03/24/2019 CLINICAL DATA:  Cough, chest pain, shortness of breath EXAM: CT CHEST WITHOUT CONTRAST TECHNIQUE: Multidetector CT imaging of the chest was performed following the standard protocol without IV contrast. COMPARISON:  Chest x-ray earlier today FINDINGS: Cardiovascular: Scattered aortic and coronary artery calcifications. Heart is normal size. Aorta is normal caliber. Mediastinum/Nodes: No mediastinal, hilar, or axillary adenopathy. Lungs/Pleura: Patchy airspace opacities in the upper lobes, right greater than left, most pronounced peripherally. Some associated tree-in-bud nodular densities in both upper lobes. Areas of consolidation noted in the right lower lobe/lung base. Probable left base atelectasis. Small right pleural effusion. Upper Abdomen: Imaging into the upper abdomen shows no acute findings. Musculoskeletal: Chest wall soft tissues are unremarkable. No acute bony abnormality. IMPRESSION: Patchy bilateral airspace opacities with tree-in-bud nodular densities in the upper lobes bilaterally, right greater than left. More confluent consolidation in the right lower lobe. Findings  concerning for pneumonia. 5-6 mm nodule in the left lower lobe along the fissure. Non-contrast chest CT at 6-12 months is recommended. If the nodule is stable at time of repeat CT, then future CT at 18-24 months (from today's scan) is considered optional for low-risk patients, but is recommended for high-risk patients. This recommendation follows the consensus statement: Guidelines for Management of Incidental Pulmonary Nodules Detected on CT Images: From the Fleischner Society 2017; Radiology 2017; 284:228-243. Small right pleural effusion. Coronary artery disease. Aortic Atherosclerosis (ICD10-I70.0). Electronically Signed   By: Rolm Baptise M.D.   On: 03/24/2019 19:51   Dg Chest Portable 1 View  Result Date: 03/24/2019 CLINICAL DATA:  Cough and right chest pain since yesterday. EXAM: PORTABLE CHEST 1 VIEW COMPARISON:  PA and lateral chest 09/05/2016. FINDINGS: Lung volumes are low but the lungs are clear. Elevation the right hemidiaphragm relative to the left noted. Heart size is upper normal. No pneumothorax or pleural fluid. IMPRESSION: No acute disease. Electronically Signed   By: Inge Rise M.D.   On: 03/24/2019 16:39   Ct Renal Stone Study  Result Date: 03/24/2019 CLINICAL DATA:  Cough, right side pain. EXAM: CT ABDOMEN AND PELVIS WITHOUT CONTRAST TECHNIQUE: Multidetector CT imaging of the abdomen and pelvis was performed following the standard protocol without IV contrast. COMPARISON:  03/02/2019 FINDINGS: Lower chest: Airspace opacities in the right lower lobe concerning for pneumonia. Left base atelectasis. Trace right effusion. Hepatobiliary: No focal hepatic abnormality. Gallbladder unremarkable. Pancreas: No focal abnormality or ductal dilatation. Spleen: No focal abnormality.  Normal size. Adrenals/Urinary Tract: Multiple left renal stones are noted. Elongated stone is seen within  the proximal left ureter with a cross-sectional diameter of 4 mm, extending over approximately 2 cm length.  There is mild left hydronephrosis. Small nonobstructing stone in the lower pole of the right kidney. No hydronephrosis on the right. Urinary bladder and adrenal glands unremarkable. Stomach/Bowel: Sigmoid diverticulosis. No active diverticulitis. Normal appendix. Stomach and small bowel decompressed, unremarkable. Vascular/Lymphatic: Aortic atherosclerosis. No enlarged abdominal or pelvic lymph nodes. Reproductive: No visible focal abnormality. Other: No free fluid or free air. Musculoskeletal: No acute bony abnormality. IMPRESSION: Airspace consolidation in the right lower lobe with trace right effusion. Findings concerning for pneumonia. Bilateral nephrolithiasis, left greater than right. 20 x 4 mm elongated stone or stones seen within the proximal left ureter with mild left hydronephrosis. Aortic atherosclerosis. Electronically Signed   By: Rolm Baptise M.D.   On: 03/24/2019 19:56    Pending Labs Unresulted Labs (From admission, onward)    Start     Ordered   03/24/19 2034  Blood culture (routine x 2)  BLOOD CULTURE X 2,   STAT     03/24/19 2033   Signed and Held  Culture, blood (routine x 2) Call MD if unable to obtain prior to antibiotics being given  BLOOD CULTURE X 2,   R    Comments: If blood cultures drawn in Emergency Department - Do not draw and cancel order    Signed and Held   Signed and Held  Culture, sputum-assessment  Once,   R     Signed and Held   Signed and Held  HIV antibody (Routine Screening)  Once,   R     Signed and Held   Signed and Held  Strep pneumoniae urinary antigen  Once,   R     Signed and Held   Signed and Held  Comprehensive metabolic panel  Tomorrow morning,   R     Signed and Held   Signed and Held  CBC  (heparin)  Once,   R    Comments: Baseline for heparin therapy IF NOT ALREADY DRAWN.  Notify MD if PLT < 100 K.    Signed and Held   Signed and Held  Creatinine, serum  (heparin)  Once,   R    Comments: Baseline for heparin therapy IF NOT ALREADY DRAWN.     Signed and Held          Vitals/Pain Today's Vitals   03/24/19 2130 03/24/19 2200 03/24/19 2230 03/24/19 2311  BP: (!) 162/79 (!) 156/72 (!) 164/78   Pulse: 71 75 85   Resp: (!) 24 (!) 27 (!) 23   Temp:    98.8 F (37.1 C)  TempSrc:    Oral  SpO2: 95% 97% 93% 96%  PainSc:        Isolation Precautions Airborne and Contact precautions  Medications Medications  vancomycin (VANCOCIN) 2,000 mg in sodium chloride 0.9 % 500 mL IVPB (2,000 mg Intravenous New Bag/Given 03/24/19 2236)  levofloxacin (LEVAQUIN) IVPB 750 mg (has no administration in time range)  azithromycin (ZITHROMAX) 500 mg in sodium chloride 0.9 % 250 mL IVPB (0 mg Intravenous Stopped 03/24/19 2221)  ceFEPIme (MAXIPIME) 2 g in sodium chloride 0.9 % 100 mL IVPB (0 g Intravenous Stopped 03/24/19 2256)    Mobility walks Low fall risk   Focused Assessments Pulmonary Assessment Handoff:  Lung sounds:   O2 Device: Nasal Cannula O2 Flow Rate (L/min): 3 L/min      R Recommendations: See Admitting Provider Note  Report given to:  Additional Notes: N/A

## 2019-03-24 NOTE — Telephone Encounter (Signed)
I am sorry to hear that.  Obviously there is a long wait.  I will see the ED records as they come in.

## 2019-03-24 NOTE — Telephone Encounter (Signed)
FYI

## 2019-03-24 NOTE — Telephone Encounter (Signed)
Pt called in c/o shortness of breath and pain in his right rib area.   He had lithotripsy done last Thursday.  I let him know he needed to go to the ED.   He wanted me to check with Dr. Quay Burow first.   "I hate to go to the ER".    I called into the office and spoke with [place] who also agreed he should go on to the ED.    He was agreeable to going and is going now.  I asked if he had someone that could go with him and he said his wife was at work but he felt ok to drive himself there.  I sent these notes to Dr. Quay Burow' office so she would be aware of the ED referral.    Reason for Disposition . Major surgery in the past month    Had lithotripsy on Thursday for a left kidney stone.  Answer Assessment - Initial Assessment Questions 1. RESPIRATORY STATUS: "Describe your breathing?" (e.g., wheezing, shortness of breath, unable to speak, severe coughing)      It started yesterday.   I had lithotripsy on Thurs at [facility].   I've been home since then.     I've had my flu shot and pneumonia 2-3 weeks ago.   It feels like I have a cold. 2. ONSET: "When did this breathing problem begin?"      It started yesterday. 3. PATTERN "Does the difficult breathing come and go, or has it been constant since it started?"      I'm sore in my right lung area.   If I'm moving around like walking I get out of breath and the soreness concerns me too. 4. SEVERITY: "How bad is your breathing?" (e.g., mild, moderate, severe)    - MILD: No SOB at rest, mild SOB with walking, speaks normally in sentences, can lay down, no retractions, pulse < 100.    - MODERATE: SOB at rest, SOB with minimal exertion and prefers to sit, cannot lie down flat, speaks in phrases, mild retractions, audible wheezing, pulse 100-120.    - SEVERE: Very SOB at rest, speaks in single words, struggling to breathe, sitting hunched forward, retractions, pulse > 120      When I walk around I get short of breath. 5. RECURRENT SYMPTOM: "Have you had  difficulty breathing before?" If so, ask: "When was the last time?" and "What happened that time?"      No 6. CARDIAC HISTORY: "Do you have any history of heart disease?" (e.g., heart attack, angina, bypass surgery, angioplasty)      No    I see my cardiologist every year.    7. LUNG HISTORY: "Do you have any history of lung disease?"  (e.g., pulmonary embolus, asthma, emphysema)     In the winter time my chest would get tight and stay that way until maybe January.   This happens every year. 8. CAUSE: "What do you think is causing the breathing problem?"      Maybe the weather changing. 9. OTHER SYMPTOMS: "Do you have any other symptoms? (e.g., dizziness, runny nose, cough, chest pain, fever)     No runny nose or sore throat.   I have some weakness from the procedure on Thkurs. 10. PREGNANCY: "Is there any chance you are pregnant?" "When was your last menstrual period?"       N/A 11. TRAVEL: "Have you traveled out of the country in the last month?" (e.g., travel history,  exposures)       No travels or exposures to COVID-19.  Protocols used: BREATHING DIFFICULTY-A-AH

## 2019-03-24 NOTE — Telephone Encounter (Signed)
Copied from [place] [id number]. Topic: General - Inquiry >> Mar 24, 2019  3:04 PM Scherrie Gerlach wrote: Reason for CRM: pt wants the dr to know he is just now called back at [facility] long.  Pt triaged at 9 am and advised to go to ED.

## 2019-03-24 NOTE — ED Notes (Addendum)
RN informed by Oliver Barre, Molecular Imaging Supervisor [phone]) that [place] and Perf Scan would not be able to be performed until 9 am 9/22 due to cameras being down. Dr. Lita Mains aware.

## 2019-03-24 NOTE — ED Provider Notes (Signed)
[facility] DEPT Provider Note   CSN: [id number] Arrival date & time: 03/24/19  1020     History   Chief Complaint Chief Complaint  Patient presents with  . Rib cage pain    HPI DAUD CAYER is a 73 y.o. male.     HPI   Pt is a 73 y/o male with h/o CHF, DM, diverticulosis, heart murmur, HLD, HTN, IBS, nephrolithiasis, who presents to the ED today c/o left rib pain that has been ongoing for the last 3 days.  States pain is intermittent. It is worse with certain movements and with inspiration. States that he has been getting some SOB with movement and mildly at rest.   He has had a mild productive cough. He denies hemoptysis. He has intermittent BLE edema and his wife states this is worse today.   Denies associated abd pain, NVD. He denies any known exposures to COVID.   States he had a lithotripsy on the left on 9/17. States he was not intubated for this procedure.   States he has a h/o tobacco use but quit 10 years ago.   Past Medical History:  Diagnosis Date  . CHF (congestive heart failure) ([place])   . Chronic diastolic heart failure ([place])   . Constipation   . Diabetes mellitus   . Diverticulosis   . ED (erectile dysfunction)   . Gout   . Heart murmur   . Hyperlipidemia   . Hypertension   . IBS (irritable bowel syndrome)   . Renal calculus   . Renal insufficiency   . Tubular adenoma of colon     Patient Active Problem List   Diagnosis Date Noted  . Nephrolithiasis 03/06/2019  . Right flank pain 07/03/2018  . Acute right-sided low back pain without sciatica 07/03/2018  . Seasonal allergic rhinitis 05/22/2018  . Paronychia of great toe of left foot 05/22/2018  . MR (mitral regurgitation) 12/20/2017  . CKD (chronic kidney disease) stage 3, GFR 30-59 ml/min (HCC) 08/14/2017  . Abdominal hernia without obstruction and without gangrene 01/10/2016  . Chronic diastolic heart failure ([place]) 02/25/2015  . Right renal mass 02/25/2015   . Acute on chronic kidney failure ([place]) 02/24/2015  . Other constipation 02/22/2015  . Chronic venous insufficiency 01/18/2015  . Superficial phlebitis 01/18/2015  . IBS (irritable bowel syndrome) 11/12/2013  . Family history of prostate cancer 12/26/2011  . Diabetes mellitus with renal complications ([place]) 25/11/3974  . Gout 12/31/2007  . HYPERLIPIDEMIA 08/14/2007  . ERECTILE DYSFUNCTION 05/09/2007  . Abnormal electrocardiogram (ECG) (EKG) 05/09/2007  . Essential hypertension 11/28/2006    Past Surgical History:  Procedure Laterality Date  . COLONOSCOPY  1998   negative; no F/U  . EXTRACORPOREAL SHOCK WAVE LITHOTRIPSY Left 03/20/2019   Procedure: EXTRACORPOREAL SHOCK WAVE LITHOTRIPSY (ESWL);  Surgeon: Cleon Gustin, MD;  Location: [facility];  Service: Urology;  Laterality: Left;  . knee effusion tapped          Home Medications    Prior to Admission medications   Medication Sig Start Date End Date Taking? Authorizing Provider  allopurinol (ZYLOPRIM) 300 MG tablet Take 1 tablet by mouth once daily 01/13/19  Yes Burns, Claudina Lick, MD  atorvastatin (LIPITOR) 20 MG tablet Take 1 tablet by mouth once daily 02/17/19  Yes Burns, Claudina Lick, MD  cloNIDine (CATAPRES) 0.1 MG tablet Take 1 tablet (0.1 mg total) by mouth 2 (two) times daily. Follow-up appt is due in Sept must see provider for future refills  01/06/19  Yes Burns, Claudina Lick, MD  glimepiride (AMARYL) 2 MG tablet TAKE 1 TABLET BY MOUTH ONCE DAILY BEFORE BREAKFAST Patient taking differently: Take 2 mg by mouth daily with breakfast.  03/14/19  Yes Elayne Snare, MD  hydrALAZINE (APRESOLINE) 25 MG tablet Take 25 mg by mouth 2 (two) times daily.   Yes [provider]  Lancets (ONETOUCH DELICA PLUS [id number]) [place] USE AS DIRECTED TWICE A DAILY Patient taking differently: 1 application by Other route 2 (two) times daily.  01/14/19  Yes Elayne Snare, MD  LINZESS 290 MCG CAPS capsule TAKE 1 CAPSULE BY MOUTH ONCE DAILY BEFORE  BREAKFAST Patient taking differently: Take 290 mcg by mouth daily before breakfast.  02/10/19  Yes Pyrtle, Lajuan Lines, MD  metoprolol tartrate (LOPRESSOR) 100 MG tablet Take 1 tablet by mouth twice daily 12/24/18  Yes Burns, Claudina Lick, MD  oxyCODONE-acetaminophen (PERCOCET) 5-325 MG tablet Take 1 tablet by mouth every 4 (four) hours as needed for moderate pain or severe pain. 03/20/19 03/19/20 Yes McKenzie, Candee Furbish, MD  sildenafil (VIAGRA) 100 MG tablet TAKE AS DIRECTED Patient taking differently: Take 100 mg by mouth as needed for erectile dysfunction.  01/22/19  Yes Burns, Claudina Lick, MD  sitaGLIPtin-metformin (JANUMET) 50-1000 MG tablet TAKE 1 TABLET BY MOUTH TWICE DAILY WITH MEALS Patient taking differently: Take 1 tablet by mouth 2 (two) times daily with a meal.  05/28/18  Yes Elayne Snare, MD  tamsulosin (FLOMAX) 0.4 MG CAPS capsule Take 1 capsule (0.4 mg total) by mouth daily after supper. 03/20/19  Yes McKenzie, Candee Furbish, MD  traMADol (ULTRAM) 50 MG tablet Take 50-100 mg by mouth every 6 (six) hours as needed. 03/14/19  Yes [provider]  aspirin EC 81 MG tablet Take 81 mg by mouth every morning.    [provider]  furosemide (LASIX) 20 MG tablet TAKE 1 TO 2 TABLETS BY MOUTH ONCE DAILY AS NEEDED FOR  FLUID  EDEMA Patient not taking: No sig reported 12/21/17   Binnie Rail, MD  [facility] ULTRA test strip USE TO TEST TWICE DAILY Patient taking differently: 1 each by Other route 2 (two) times daily.  01/14/19   Elayne Snare, MD    Family History Family History  Problem Relation Age of Onset  . Heart attack Mother 68  . Hypertension Mother   . Prostate cancer Father 81  . Diabetes Father   . Irritable bowel syndrome Father   . Colon cancer Brother        dx in his late 75's  . Irritable bowel syndrome Sister        x 2  . Irritable bowel syndrome Brother   . Stroke Neg Hx   . Esophageal cancer Neg Hx   . Rectal cancer Neg Hx   . Stomach cancer Neg Hx     Social  History Social History   Tobacco Use  . Smoking status: Former Smoker    Packs/day: 0.30    Types: Cigarettes    Quit date: 07/04/1991    Years since quitting: 27.7  . Smokeless tobacco: Never Used  . Tobacco comment: smoked age [age], up to 1/3 ppd; may be less  Substance Use Topics  . Alcohol use: Yes    Alcohol/week: 2.0 standard drinks    Types: 2 Glasses of wine per week    Comment: rare  . Drug use: No     Allergies   Ace inhibitors, Angiotensin receptor blockers, and Losartan   Review of  Systems Review of Systems  Constitutional: Positive for chills. Negative for fever.  HENT: Negative for ear pain and sore throat.   Eyes: Negative for pain and visual disturbance.  Respiratory: Positive for cough and shortness of breath.   Cardiovascular: Positive for chest pain and leg swelling.  Gastrointestinal: Negative for abdominal pain, constipation, diarrhea, nausea and vomiting.  Genitourinary: Negative for dysuria and hematuria.  Musculoskeletal: Negative for back pain.  Skin: Negative for rash.  Neurological: Negative for headaches.  All other systems reviewed and are negative.    Physical Exam Updated Vital Signs BP (!) 179/88   Pulse 71   Temp 98.6 F (37 C) (Oral)   Resp (!) 22   SpO2 96%   Physical Exam Vitals signs and nursing note reviewed.  Constitutional:      Appearance: He is well-developed. He is not ill-appearing.  HENT:     Head: Normocephalic and atraumatic.  Eyes:     Conjunctiva/sclera: Conjunctivae normal.  Neck:     Musculoskeletal: Neck supple.  Cardiovascular:     Rate and Rhythm: Normal rate and regular rhythm.     Pulses: Normal pulses.     Heart sounds: Normal heart sounds. No murmur.  Pulmonary:     Effort: Pulmonary effort is normal. No respiratory distress.     Breath sounds: Normal breath sounds. No wheezing, rhonchi or rales.  Chest:     Chest wall: Tenderness (right lower chest wall) present.  Abdominal:     General:  Bowel sounds are normal.     Palpations: Abdomen is soft.     Tenderness: There is no abdominal tenderness. There is no guarding or rebound.  Musculoskeletal:        General: No tenderness.     Right lower leg: Edema present.     Left lower leg: Edema present.  Skin:    General: Skin is warm and dry.  Neurological:     Mental Status: He is alert.      ED Treatments / Results  Labs (all labs ordered are listed, but only abnormal results are displayed) Labs Reviewed  CBC WITH DIFFERENTIAL/PLATELET - Abnormal; Notable for the following components:      Result Value   Hemoglobin 12.3 (*)    All other components within normal limits  BASIC METABOLIC PANEL - Abnormal; Notable for the following components:   CO2 19 (*)    Glucose, Bld 140 (*)    BUN 39 (*)    Creatinine, Ser 3.15 (*)    GFR calc non Af Amer 19 (*)    GFR calc Af Amer 22 (*)    All other components within normal limits  BRAIN NATRIURETIC PEPTIDE - Abnormal; Notable for the following components:   B Natriuretic Peptide 204.7 (*)    All other components within normal limits  D-DIMER, QUANTITATIVE (NOT AT [facility]) - Abnormal; Notable for the following components:   D-Dimer, Quant 11.94 (*)    All other components within normal limits  HEPATIC FUNCTION PANEL - Abnormal; Notable for the following components:   Total Protein 5.9 (*)    Albumin 2.7 (*)    AST 10 (*)    All other components within normal limits  URINALYSIS, ROUTINE W REFLEX MICROSCOPIC - Abnormal; Notable for the following components:   APPearance HAZY (*)    Hgb urine dipstick SMALL (*)    Ketones, ur 5 (*)    Protein, ur 100 (*)    Bacteria, UA RARE (*)  All other components within normal limits  TROPONIN I (HIGH SENSITIVITY) - Abnormal; Notable for the following components:   Troponin I (High Sensitivity) 26 (*)    All other components within normal limits  TROPONIN I (HIGH SENSITIVITY) - Abnormal; Notable for the following components:    Troponin I (High Sensitivity) 21 (*)    All other components within normal limits  SARS CORONAVIRUS 2 (HOSPITAL ORDER, [facility])    EKG EKG Interpretation  Date/Time:  Monday March 24 2019 16:24:01 EDT Ventricular Rate:  72 PR Interval:    QRS Duration: 87 QT Interval:  383 QTC Calculation: 420 R Axis:   6 Text Interpretation:  Sinus rhythm Consider left ventricular hypertrophy Nonspecific T abnormalities, inferior leads Confirmed by Julianne Rice [phone]) on 03/24/2019 5:36:46 PM   Radiology Ct Chest Wo Contrast  Result Date: 03/24/2019 CLINICAL DATA:  Cough, chest pain, shortness of breath EXAM: CT CHEST WITHOUT CONTRAST TECHNIQUE: Multidetector CT imaging of the chest was performed following the standard protocol without IV contrast. COMPARISON:  Chest x-ray earlier today FINDINGS: Cardiovascular: Scattered aortic and coronary artery calcifications. Heart is normal size. Aorta is normal caliber. Mediastinum/Nodes: No mediastinal, hilar, or axillary adenopathy. Lungs/Pleura: Patchy airspace opacities in the upper lobes, right greater than left, most pronounced peripherally. Some associated tree-in-bud nodular densities in both upper lobes. Areas of consolidation noted in the right lower lobe/lung base. Probable left base atelectasis. Small right pleural effusion. Upper Abdomen: Imaging into the upper abdomen shows no acute findings. Musculoskeletal: Chest wall soft tissues are unremarkable. No acute bony abnormality. IMPRESSION: Patchy bilateral airspace opacities with tree-in-bud nodular densities in the upper lobes bilaterally, right greater than left. More confluent consolidation in the right lower lobe. Findings concerning for pneumonia. 5-6 mm nodule in the left lower lobe along the fissure. Non-contrast chest CT at 6-12 months is recommended. If the nodule is stable at time of repeat CT, then future CT at 18-24 months (from today's scan) is considered  optional for low-risk patients, but is recommended for high-risk patients. This recommendation follows the consensus statement: Guidelines for Management of Incidental Pulmonary Nodules Detected on CT Images: From the Fleischner Society 2017; Radiology 2017; 284:228-243. Small right pleural effusion. Coronary artery disease. Aortic Atherosclerosis (ICD10-I70.0). Electronically Signed   By: Rolm Baptise M.D.   On: 03/24/2019 19:51   Dg Chest Portable 1 View  Result Date: 03/24/2019 CLINICAL DATA:  Cough and right chest pain since yesterday. EXAM: PORTABLE CHEST 1 VIEW COMPARISON:  PA and lateral chest 09/05/2016. FINDINGS: Lung volumes are low but the lungs are clear. Elevation the right hemidiaphragm relative to the left noted. Heart size is upper normal. No pneumothorax or pleural fluid. IMPRESSION: No acute disease. Electronically Signed   By: Inge Rise M.D.   On: 03/24/2019 16:39   Ct Renal Stone Study  Result Date: 03/24/2019 CLINICAL DATA:  Cough, right side pain. EXAM: CT ABDOMEN AND PELVIS WITHOUT CONTRAST TECHNIQUE: Multidetector CT imaging of the abdomen and pelvis was performed following the standard protocol without IV contrast. COMPARISON:  03/02/2019 FINDINGS: Lower chest: Airspace opacities in the right lower lobe concerning for pneumonia. Left base atelectasis. Trace right effusion. Hepatobiliary: No focal hepatic abnormality. Gallbladder unremarkable. Pancreas: No focal abnormality or ductal dilatation. Spleen: No focal abnormality.  Normal size. Adrenals/Urinary Tract: Multiple left renal stones are noted. Elongated stone is seen within the proximal left ureter with a cross-sectional diameter of 4 mm, extending over approximately 2 cm length. There is  mild left hydronephrosis. Small nonobstructing stone in the lower pole of the right kidney. No hydronephrosis on the right. Urinary bladder and adrenal glands unremarkable. Stomach/Bowel: Sigmoid diverticulosis. No active diverticulitis.  Normal appendix. Stomach and small bowel decompressed, unremarkable. Vascular/Lymphatic: Aortic atherosclerosis. No enlarged abdominal or pelvic lymph nodes. Reproductive: No visible focal abnormality. Other: No free fluid or free air. Musculoskeletal: No acute bony abnormality. IMPRESSION: Airspace consolidation in the right lower lobe with trace right effusion. Findings concerning for pneumonia. Bilateral nephrolithiasis, left greater than right. 20 x 4 mm elongated stone or stones seen within the proximal left ureter with mild left hydronephrosis. Aortic atherosclerosis. Electronically Signed   By: Rolm Baptise M.D.   On: 03/24/2019 19:56    Procedures Procedures (including critical care time)  Medications Ordered in ED Medications  cefTRIAXone (ROCEPHIN) 1 g in sodium chloride 0.9 % 100 mL IVPB (has no administration in time range)  azithromycin (ZITHROMAX) 500 mg in sodium chloride 0.9 % 250 mL IVPB (has no administration in time range)     Initial Impression / Assessment and Plan / ED Course  I have reviewed the triage vital signs and the nursing notes.  Pertinent labs & imaging results that were available during my care of the patient were reviewed by me and considered in my medical decision making (see chart for details).     Final Clinical Impressions(s) / ED Diagnoses   Final diagnoses:  Community acquired pneumonia of right lung, unspecified part of lung  Hypoxia   Pt presenting with SOB and right lower chest pain that is pleuritic starting yesterday.   On arrival pt found to be hypoxic. He has afebrile and is not tachycardic. His BP is stable.   Lungs are CTAB. Heart with RRR. Abd soft and nontender.   Will obtain labs, ekg, cxr CBC with mild leukocytosis. Anemia present but at baseline.  BMP with low bicarb at 19, elevated BUN/Cr at 39/3.15 up from prior labs from 2 weeks ago at 31/1.77.  Trop Ddimer BNP COVID testing  Discussed with Network engineer, she contacted  technologist for VQ scan who states that currently scanner is down and a reading for the scan will not be available until tomorrow at 9:00 AM.   Discussed case with Dr. Lita Mains. He personally evaluated the patient. He recommended CT imaging of the chest and abd.   ED Discharge Orders    None       Bishop Dublin 03/24/19 2022    Carmin Muskrat, MD 03/25/19 [phone]

## 2019-03-24 NOTE — ED Triage Notes (Signed)
Pt presents with c/o dry cough and some right sided rib cage pain for the past day. Pt reports the pain in his rib cage is more severe with movement. Pt did have a lithotripsy done this past Thursday.

## 2019-03-24 NOTE — H&P (Signed)
History and Physical   LARK RUNK EGB:[id number] DOB: Apr 23, 1946 DOA: 03/24/2019  Referring MD/NP/PA: Dr. Lita Mains  PCP: Binnie Rail, MD   Outpatient Specialists: None  Patient coming from: Home  Chief Complaint: Shortness of breath  HPI: Noah Barnes is a 73 y.o. male with medical history significant of nephrolithiasis status post lithotripsy only last week, diastolic dysfunction CHF, diabetes, hypertension, gout, irritable bowel syndrome, diverticular disease and erectile dysfunction who came to the ER with progressive shortness of breath over the last couple of days.  He had his lithotripsy about a week ago.  He was in short stay and patient had anesthesia.  Patient returned today with shortness of breath and cough.  Associated with some low-grade temperature.  He denied any significant sputum.  He also has some right-sided chest pain which was dull ache with occasional sharp pain in the area.  Highest was 6 out of 10.  Patient seen and evaluated in the ER.  Appears to have right lower lobe pneumonia on CT.  He has acute on chronic kidney disease so unable to get CT angiogram to rule out PE.  Patient is being admitted at this point with diagnoses of healthcare associated pneumonia possibly aspiration from his procedure last week..  ED Course: Temperature is 98.6 blood pressure 187/101 pulse 87 respiratory 30 oxygen sat 81% on room air currently 99% on 2 L.  White count is 8.0 hemoglobin 12.3 and platelets 256.  Sodium 139 potassium 5.1 chloride 109 CO2 of 19 with BUN 39 creatinine 3.15 his baseline is 1.7.  Calcium 9.7.  His albumin is 2.7.  Troponin XX 1 and 26 BNP 204 d-dimer is 19.  Chest x-ray showed no acute findings but CT chest without contrast showed patchy bilateral airspace opacities with tree-in-bud nodular density in the upper lobes bilaterally right greater than left more confluent consolidation in the right lower lobe.  Findings concerning for an pneumonia.  He has some  nodule 5.6 mm in the left lower lobe.  CT abdomen renal stone study showed the airspace consolidation in the right lower lobe with bilateral nephrolithiasis left greater than right.  He has 20 x 4 mm stone seen within the proximal left ureter with mild left hydronephrosis.  Review of Systems: As per HPI otherwise 10 point review of systems negative.    Past Medical History:  Diagnosis Date  . CHF (congestive heart failure) ([place])   . Chronic diastolic heart failure ([place])   . Constipation   . Diabetes mellitus   . Diverticulosis   . ED (erectile dysfunction)   . Gout   . Heart murmur   . Hyperlipidemia   . Hypertension   . IBS (irritable bowel syndrome)   . Renal calculus   . Renal insufficiency   . Tubular adenoma of colon     Past Surgical History:  Procedure Laterality Date  . COLONOSCOPY  1998   negative; no F/U  . EXTRACORPOREAL SHOCK WAVE LITHOTRIPSY Left 03/20/2019   Procedure: EXTRACORPOREAL SHOCK WAVE LITHOTRIPSY (ESWL);  Surgeon: Cleon Gustin, MD;  Location: [facility];  Service: Urology;  Laterality: Left;  . knee effusion tapped       reports that he quit smoking about 27 years ago. His smoking use included cigarettes. He smoked 0.30 packs per day. He has never used smokeless tobacco. He reports current alcohol use of about 2.0 standard drinks of alcohol per week. He reports that he does not use drugs.  Allergies  Allergen Reactions  .  Ace Inhibitors Other (See Comments)    Severe AKI due to ARB + dehydration Aug 2016  . Angiotensin Receptor Blockers Other (See Comments)    Severe AKI due to ARB + dehydration Aug 2016  . Losartan Other (See Comments)    Severe AKI due to ARB + dehydration Aug 2016    Family History  Problem Relation Age of Onset  . Heart attack Mother 47  . Hypertension Mother   . Prostate cancer Father 90  . Diabetes Father   . Irritable bowel syndrome Father   . Colon cancer Brother        dx in his late 46's  . Irritable bowel  syndrome Sister        x 2  . Irritable bowel syndrome Brother   . Stroke Neg Hx   . Esophageal cancer Neg Hx   . Rectal cancer Neg Hx   . Stomach cancer Neg Hx      Prior to Admission medications   Medication Sig Start Date End Date Taking? Authorizing Provider  allopurinol (ZYLOPRIM) 300 MG tablet Take 1 tablet by mouth once daily 01/13/19  Yes Burns, Claudina Lick, MD  atorvastatin (LIPITOR) 20 MG tablet Take 1 tablet by mouth once daily 02/17/19  Yes Burns, Claudina Lick, MD  cloNIDine (CATAPRES) 0.1 MG tablet Take 1 tablet (0.1 mg total) by mouth 2 (two) times daily. Follow-up appt is due in Sept must see provider for future refills 01/06/19  Yes Burns, Claudina Lick, MD  glimepiride (AMARYL) 2 MG tablet TAKE 1 TABLET BY MOUTH ONCE DAILY BEFORE BREAKFAST Patient taking differently: Take 2 mg by mouth daily with breakfast.  03/14/19  Yes Elayne Snare, MD  hydrALAZINE (APRESOLINE) 25 MG tablet Take 25 mg by mouth 2 (two) times daily.   Yes [provider]  Lancets (ONETOUCH DELICA PLUS [id number]) [place] USE AS DIRECTED TWICE A DAILY Patient taking differently: 1 application by Other route 2 (two) times daily.  01/14/19  Yes Elayne Snare, MD  LINZESS 290 MCG CAPS capsule TAKE 1 CAPSULE BY MOUTH ONCE DAILY BEFORE BREAKFAST Patient taking differently: Take 290 mcg by mouth daily before breakfast.  02/10/19  Yes Pyrtle, Lajuan Lines, MD  metoprolol tartrate (LOPRESSOR) 100 MG tablet Take 1 tablet by mouth twice daily 12/24/18  Yes Burns, Claudina Lick, MD  oxyCODONE-acetaminophen (PERCOCET) 5-325 MG tablet Take 1 tablet by mouth every 4 (four) hours as needed for moderate pain or severe pain. 03/20/19 03/19/20 Yes McKenzie, Candee Furbish, MD  sildenafil (VIAGRA) 100 MG tablet TAKE AS DIRECTED Patient taking differently: Take 100 mg by mouth as needed for erectile dysfunction.  01/22/19  Yes Burns, Claudina Lick, MD  sitaGLIPtin-metformin (JANUMET) 50-1000 MG tablet TAKE 1 TABLET BY MOUTH TWICE DAILY WITH MEALS Patient taking  differently: Take 1 tablet by mouth 2 (two) times daily with a meal.  05/28/18  Yes Elayne Snare, MD  tamsulosin (FLOMAX) 0.4 MG CAPS capsule Take 1 capsule (0.4 mg total) by mouth daily after supper. 03/20/19  Yes McKenzie, Candee Furbish, MD  traMADol (ULTRAM) 50 MG tablet Take 50-100 mg by mouth every 6 (six) hours as needed. 03/14/19  Yes [provider]  aspirin EC 81 MG tablet Take 81 mg by mouth every morning.    [provider]  furosemide (LASIX) 20 MG tablet TAKE 1 TO 2 TABLETS BY MOUTH ONCE DAILY AS NEEDED FOR  FLUID  EDEMA Patient not taking: No sig reported 12/21/17   Binnie Rail, MD  ONETOUCH ULTRA test strip USE TO TEST TWICE DAILY Patient taking differently: 1 each by Other route 2 (two) times daily.  01/14/19   Elayne Snare, MD    Physical Exam: Vitals:   03/24/19 2030 03/24/19 2045 03/24/19 2100 03/24/19 2130  BP: (!) 171/83  (!) 158/66 (!) 162/79  Pulse: 67 66 69 71  Resp: (!) 30 20 (!) 27 (!) 24  Temp:      TempSrc:      SpO2: 98% 97% 97% 95%      Constitutional: Stable, no acute distress, Vitals:   03/24/19 2030 03/24/19 2045 03/24/19 2100 03/24/19 2130  BP: (!) 171/83  (!) 158/66 (!) 162/79  Pulse: 67 66 69 71  Resp: (!) 30 20 (!) 27 (!) 24  Temp:      TempSrc:      SpO2: 98% 97% 97% 95%   Eyes: PERRL, lids and conjunctivae normal ENMT: Mucous membranes are moist. Posterior pharynx clear of any exudate or lesions.Normal dentition.  Neck: normal, supple, no masses, no thyromegaly Respiratory: Decreased air entry bilaterally, coarse breath sounds and rhonchi no wheezing, no crackles. Normal respiratory effort. No accessory muscle use.  Cardiovascular: Regular rate and rhythm, no murmurs / rubs / gallops. No extremity edema. 2+ pedal pulses. No carotid bruits.  Abdomen: no tenderness, no masses palpated. No hepatosplenomegaly. Bowel sounds positive.  Musculoskeletal: no clubbing / cyanosis. No joint deformity upper and lower extremities. Good ROM,  no contractures. Normal muscle tone.  Skin: no rashes, lesions, ulcers. No induration Neurologic: CN 2-12 grossly intact. Sensation intact, DTR normal. Strength 5/5 in all 4.  Psychiatric: Normal judgment and insight. Alert and oriented x 3. Normal mood.     Labs on Admission: I have personally reviewed following labs and imaging studies  CBC: Recent Labs  Lab 03/24/19 1607  WBC 8.0  NEUTROABS 6.5  HGB 12.3*  HCT 40.8  MCV 88.3  PLT 539   Basic Metabolic Panel: Recent Labs  Lab 03/24/19 1607  NA 139  K 5.1  CL 109  CO2 19*  GLUCOSE 140*  BUN 39*  CREATININE 3.15*  CALCIUM 9.7   GFR: Estimated Creatinine Clearance: 23.8 mL/min (A) (by C-G formula based on SCr of 3.15 mg/dL (H)). Liver Function Tests: Recent Labs  Lab 03/24/19 1752  AST 10*  ALT 14  ALKPHOS 46  BILITOT 0.8  PROT 5.9*  ALBUMIN 2.7*   No results for input(s): LIPASE, AMYLASE in the last 168 hours. No results for input(s): AMMONIA in the last 168 hours. Coagulation Profile: No results for input(s): INR, PROTIME in the last 168 hours. Cardiac Enzymes: No results for input(s): CKTOTAL, CKMB, CKMBINDEX, TROPONINI in the last 168 hours. BNP (last 3 results) No results for input(s): PROBNP in the last 8760 hours. HbA1C: No results for input(s): HGBA1C in the last 72 hours. CBG: Recent Labs  Lab 03/20/19 0625  GLUCAP 141*   Lipid Profile: No results for input(s): CHOL, HDL, LDLCALC, TRIG, CHOLHDL, LDLDIRECT in the last 72 hours. Thyroid Function Tests: No results for input(s): TSH, T4TOTAL, FREET4, T3FREE, THYROIDAB in the last 72 hours. Anemia Panel: No results for input(s): VITAMINB12, FOLATE, FERRITIN, TIBC, IRON, RETICCTPCT in the last 72 hours. Urine analysis:    Component Value Date/Time   COLORURINE YELLOW 03/24/2019 1917   APPEARANCEUR HAZY (A) 03/24/2019 1917   LABSPEC 1.020 03/24/2019 1917   PHURINE 5.0 03/24/2019 [place] 03/24/2019 [place]  03/06/2019 1041   HGBUR  SMALL (A) 03/24/2019 [place] 03/24/2019 1917   BILIRUBINUR Neg 01/05/2015 0944   KETONESUR 5 (A) 03/24/2019 1917   PROTEINUR 100 (A) 03/24/2019 1917   UROBILINOGEN 0.2 03/06/2019 1041   NITRITE NEGATIVE 03/24/2019 1917   LEUKOCYTESUR NEGATIVE 03/24/2019 1917   Sepsis Labs: @LABRCNTIP (procalcitonin:4,lacticidven:4) ) Recent Results (from the past 240 hour(s))  SARS Coronavirus 2 [facility] order, Performed in [facility] hospital lab) Nasopharyngeal Nasopharyngeal Swab     Status: None   Collection Time: 03/24/19  4:08 PM   Specimen: Nasopharyngeal Swab  Result Value Ref Range Status   SARS Coronavirus 2 NEGATIVE NEGATIVE Final    Comment: (NOTE) If result is NEGATIVE SARS-CoV-2 target nucleic acids are NOT DETECTED. The SARS-CoV-2 RNA is generally detectable in upper and lower  respiratory specimens during the acute phase of infection. The lowest  concentration of SARS-CoV-2 viral copies this assay can detect is 250  copies / mL. A negative result does not preclude SARS-CoV-2 infection  and should not be used as the sole basis for treatment or other  patient management decisions.  A negative result may occur with  improper specimen collection / handling, submission of specimen other  than nasopharyngeal swab, presence of viral mutation(s) within the  areas targeted by this assay, and inadequate number of viral copies  (<250 copies / mL). A negative result must be combined with clinical  observations, patient history, and epidemiological information. If result is POSITIVE SARS-CoV-2 target nucleic acids are DETECTED. The SARS-CoV-2 RNA is generally detectable in upper and lower  respiratory specimens dur ing the acute phase of infection.  Positive  results are indicative of active infection with SARS-CoV-2.  Clinical  correlation with patient history and other diagnostic information is  necessary to determine patient infection status.   Positive results do  not rule out bacterial infection or co-infection with other viruses. If result is PRESUMPTIVE POSTIVE SARS-CoV-2 nucleic acids MAY BE PRESENT.   A presumptive positive result was obtained on the submitted specimen  and confirmed on repeat testing.  While 2019 novel coronavirus  (SARS-CoV-2) nucleic acids may be present in the submitted sample  additional confirmatory testing may be necessary for epidemiological  and / or clinical management purposes  to differentiate between  SARS-CoV-2 and other Sarbecovirus currently known to infect humans.  If clinically indicated additional testing with an alternate test  methodology [phone]) is advised. The SARS-CoV-2 RNA is generally  detectable in upper and lower respiratory sp ecimens during the acute  phase of infection. The expected result is Negative. Fact Sheet for Patients:  StrictlyIdeas.no Fact Sheet for Healthcare Providers: BankingDealers.co.za This test is not yet approved or cleared by the [place] FDA and has been authorized for detection and/or diagnosis of SARS-CoV-2 by FDA under an Emergency Use Authorization (EUA).  This EUA will remain in effect (meaning this test can be used) for the duration of the COVID-19 declaration under Section 564(b)(1) of the Act, 21 U.S.C. section 360bbb-3(b)(1), unless the authorization is terminated or revoked sooner. Performed at [facility], [place] [address] [place], [place] [address]      Radiological Exams on Admission: Ct Chest Wo Contrast  Result Date: 03/24/2019 CLINICAL DATA:  Cough, chest pain, shortness of breath EXAM: CT CHEST WITHOUT CONTRAST TECHNIQUE: Multidetector CT imaging of the chest was performed following the standard protocol without IV contrast. COMPARISON:  Chest x-ray earlier today FINDINGS: Cardiovascular: Scattered aortic and coronary artery calcifications. Heart is normal size.  Aorta  is normal caliber. Mediastinum/Nodes: No mediastinal, hilar, or axillary adenopathy. Lungs/Pleura: Patchy airspace opacities in the upper lobes, right greater than left, most pronounced peripherally. Some associated tree-in-bud nodular densities in both upper lobes. Areas of consolidation noted in the right lower lobe/lung base. Probable left base atelectasis. Small right pleural effusion. Upper Abdomen: Imaging into the upper abdomen shows no acute findings. Musculoskeletal: Chest wall soft tissues are unremarkable. No acute bony abnormality. IMPRESSION: Patchy bilateral airspace opacities with tree-in-bud nodular densities in the upper lobes bilaterally, right greater than left. More confluent consolidation in the right lower lobe. Findings concerning for pneumonia. 5-6 mm nodule in the left lower lobe along the fissure. Non-contrast chest CT at 6-12 months is recommended. If the nodule is stable at time of repeat CT, then future CT at 18-24 months (from today's scan) is considered optional for low-risk patients, but is recommended for high-risk patients. This recommendation follows the consensus statement: Guidelines for Management of Incidental Pulmonary Nodules Detected on CT Images: From the Fleischner Society 2017; Radiology 2017; 284:228-243. Small right pleural effusion. Coronary artery disease. Aortic Atherosclerosis (ICD10-I70.0). Electronically Signed   By: Rolm Baptise M.D.   On: 03/24/2019 19:51   Dg Chest Portable 1 View  Result Date: 03/24/2019 CLINICAL DATA:  Cough and right chest pain since yesterday. EXAM: PORTABLE CHEST 1 VIEW COMPARISON:  PA and lateral chest 09/05/2016. FINDINGS: Lung volumes are low but the lungs are clear. Elevation the right hemidiaphragm relative to the left noted. Heart size is upper normal. No pneumothorax or pleural fluid. IMPRESSION: No acute disease. Electronically Signed   By: Inge Rise M.D.   On: 03/24/2019 16:39   Ct Renal Stone Study  Result  Date: 03/24/2019 CLINICAL DATA:  Cough, right side pain. EXAM: CT ABDOMEN AND PELVIS WITHOUT CONTRAST TECHNIQUE: Multidetector CT imaging of the abdomen and pelvis was performed following the standard protocol without IV contrast. COMPARISON:  03/02/2019 FINDINGS: Lower chest: Airspace opacities in the right lower lobe concerning for pneumonia. Left base atelectasis. Trace right effusion. Hepatobiliary: No focal hepatic abnormality. Gallbladder unremarkable. Pancreas: No focal abnormality or ductal dilatation. Spleen: No focal abnormality.  Normal size. Adrenals/Urinary Tract: Multiple left renal stones are noted. Elongated stone is seen within the proximal left ureter with a cross-sectional diameter of 4 mm, extending over approximately 2 cm length. There is mild left hydronephrosis. Small nonobstructing stone in the lower pole of the right kidney. No hydronephrosis on the right. Urinary bladder and adrenal glands unremarkable. Stomach/Bowel: Sigmoid diverticulosis. No active diverticulitis. Normal appendix. Stomach and small bowel decompressed, unremarkable. Vascular/Lymphatic: Aortic atherosclerosis. No enlarged abdominal or pelvic lymph nodes. Reproductive: No visible focal abnormality. Other: No free fluid or free air. Musculoskeletal: No acute bony abnormality. IMPRESSION: Airspace consolidation in the right lower lobe with trace right effusion. Findings concerning for pneumonia. Bilateral nephrolithiasis, left greater than right. 20 x 4 mm elongated stone or stones seen within the proximal left ureter with mild left hydronephrosis. Aortic atherosclerosis. Electronically Signed   By: Rolm Baptise M.D.   On: 03/24/2019 19:56      Assessment/Plan Principal Problem:   HCAP (healthcare-associated pneumonia) Active Problems:   Diabetes mellitus with renal complications ([place])   HYPERLIPIDEMIA   Essential hypertension   IBS (irritable bowel syndrome)   Acute on chronic kidney failure ([facility])   Chronic  diastolic heart failure ([place])     #1 Healthcare versus aspiration pneumonia: Based on patient's recent procedure and stay at the day hospital we will treat his pneumonia  as healthcare associated.  Treated with combination of Vanco cefepime and Levaquin.  Blood cultures.  If sputum is produced we will get cultures as well.  Oxygen as well as supportive care.  Less likely to be PE.  COVID-19 is negative  #2 acute on chronic kidney disease: May be related to his obstructive uropathy.  Patient will be monitored.  Pain control.  IV fluids and monitor renal function.  Urology may be reconsulted due to his mild hydronephrosis but he just had lithotripsy.  #3 diabetes: Continue with sliding scale insulin.  Hold metformin.  #4 hyperlipidemia: Continue with the statin  #5 chronic diastolic heart failure: Stable.  Gentle diuresis therefore.  #6 essential hypertension: Monitor blood pressure and continue home regimen   DVT prophylaxis: Heparin Code Status: Full code Family Communication: No family at bedside Disposition Plan: Home Consults called: None Admission status: Inpatient  Severity of Illness: The appropriate patient status for this patient is INPATIENT. Inpatient status is judged to be reasonable and necessary in order to provide the required intensity of service to ensure the patient's safety. The patient's presenting symptoms, physical exam findings, and initial radiographic and laboratory data in the context of their chronic comorbidities is felt to place them at high risk for further clinical deterioration. Furthermore, it is not anticipated that the patient will be medically stable for discharge from the hospital within 2 midnights of admission. The following factors support the patient status of inpatient.   " The patient's presenting symptoms include shortness of breath. " The worrisome physical exam findings include coarse breath sounds in the bases. " The initial radiographic and  laboratory data are worrisome because of chest CT showing bilateral pneumonia right more than left. " The chronic co-morbidities include diabetes with hypertension.   * I certify that at the point of admission it is my clinical judgment that the patient will require inpatient hospital care spanning beyond 2 midnights from the point of admission due to high intensity of service, high risk for further deterioration and high frequency of surveillance required.Barbette Merino MD [facility] Pager [phone]  If 7PM-7AM, please contact night-coverage [email or web address] Password [facility]  03/24/2019, 9:51 PM

## 2019-03-25 ENCOUNTER — Other Ambulatory Visit: Payer: Self-pay

## 2019-03-25 ENCOUNTER — Inpatient Hospital Stay (HOSPITAL_COMMUNITY): Payer: Medicare Other

## 2019-03-25 LAB — COMPREHENSIVE METABOLIC PANEL
ALT: 17 U/L (ref 0–44)
AST: 13 U/L — ABNORMAL LOW (ref 15–41)
Albumin: 3.2 g/dL — ABNORMAL LOW (ref 3.5–5.0)
Alkaline Phosphatase: 59 U/L (ref 38–126)
Anion gap: 14 (ref 5–15)
BUN: 35 mg/dL — ABNORMAL HIGH (ref 8–23)
CO2: 15 mmol/L — ABNORMAL LOW (ref 22–32)
Calcium: 9.2 mg/dL (ref 8.9–10.3)
Chloride: 112 mmol/L — ABNORMAL HIGH (ref 98–111)
Creatinine, Ser: 2.96 mg/dL — ABNORMAL HIGH (ref 0.61–1.24)
GFR calc Af Amer: 23 mL/min — ABNORMAL LOW (ref >60)
GFR calc non Af Amer: 20 mL/min — ABNORMAL LOW (ref >60)
Glucose, Bld: 135 mg/dL — ABNORMAL HIGH (ref 70–99)
Potassium: 5.1 mmol/L (ref 3.5–5.1)
Sodium: 141 mmol/L (ref 135–145)
Total Bilirubin: 0.8 mg/dL (ref 0.3–1.2)
Total Protein: 6.9 g/dL (ref 6.5–8.1)

## 2019-03-25 LAB — GLUCOSE, CAPILLARY
Glucose-Capillary: 118 mg/dL — ABNORMAL HIGH (ref 70–99)
Glucose-Capillary: 126 mg/dL — ABNORMAL HIGH (ref 70–99)
Glucose-Capillary: 126 mg/dL — ABNORMAL HIGH (ref 70–99)
Glucose-Capillary: 134 mg/dL — ABNORMAL HIGH (ref 70–99)
Glucose-Capillary: 138 mg/dL — ABNORMAL HIGH (ref 70–99)

## 2019-03-25 LAB — MRSA PCR SCREENING: MRSA by PCR: NEGATIVE

## 2019-03-25 LAB — STREP PNEUMONIAE URINARY ANTIGEN: Strep Pneumo Urinary Antigen: NEGATIVE

## 2019-03-25 LAB — HIV ANTIBODY (ROUTINE TESTING W REFLEX): HIV Screen 4th Generation wRfx: NONREACTIVE

## 2019-03-25 MED ORDER — VANCOMYCIN HCL IN DEXTROSE 1-5 GM/200ML-% IV SOLN: 1000.0000 mg | INTRAVENOUS | Status: DC

## 2019-03-25 MED ORDER — TECHNETIUM TO 99M ALBUMIN AGGREGATED
1.5700 | Freq: Once | INTRAVENOUS | Status: AC | PRN
  Administered 2019-03-25: 1.57 via INTRAVENOUS

## 2019-03-25 MED ORDER — TECHNETIUM TC 99M DIETHYLENETRIAME-PENTAACETIC ACID
29.5000 | Freq: Once | INTRAVENOUS | Status: AC | PRN
  Administered 2019-03-25: 29.5 via INTRAVENOUS

## 2019-03-25 MED ORDER — SODIUM CHLORIDE 0.9 % IV SOLN
2.0000 g | INTRAVENOUS | Status: DC
  Administered 2019-03-25 – 2019-03-28 (×4): 2 g via INTRAVENOUS
  Filled 2019-03-25 (×4): qty 2

## 2019-03-25 NOTE — Progress Notes (Signed)
Pharmacy Antibiotic Note  ED MANDICH is a 73 y.o. male admitted on 03/24/2019 with pneumonia.  Pharmacy has been consulted for Vancomycin, cefepime, levofloxacin dosing.  Plan: Vancomycin 2gm iv x1, then Vancomycin 1000 mg IV Q 48 hrs. Goal AUC 400-550. Expected AUC: 461 SCr used: 2.96  Cefepime 2gm iv x1, then 2gm iv q24hr  Levofloxacin 750mg  iv q48hr--start 9/22 at 1800 due to prior azithromycin dose 9/21   Height: 5\' 8"  (172.7 cm) Weight: 212 lb 4.9 oz (96.3 kg) IBW/kg (Calculated) : 68.4  Temp (24hrs), Avg:99.1 F (37.3 C), Min:98.6 F (37 C), Max:100 F (37.8 C)  Recent Labs  Lab 03/24/19 1607 03/25/19 0048  WBC 8.0  --   CREATININE 3.15* 2.96*    Estimated Creatinine Clearance: 25.4 mL/min (A) (by C-G formula based on SCr of 2.96 mg/dL (H)).    Allergies  Allergen Reactions  . Ace Inhibitors Other (See Comments)    Severe AKI due to ARB + dehydration Aug 2016  . Angiotensin Receptor Blockers Other (See Comments)    Severe AKI due to ARB + dehydration Aug 2016  . Losartan Other (See Comments)    Severe AKI due to ARB + dehydration Aug 2016    Antimicrobials this admission: Vancomycin 03/24/2019 >> Cefepime 03/24/2019 >>  Levofloxacin 03/25/2019 >>  Dose adjustments this admission: -  Microbiology results: -  Thank you for allowing pharmacy to be a part of this patient's care.  Nani Skillern Crowford 03/25/2019 2:14 AM

## 2019-03-25 NOTE — Plan of Care (Signed)
  Problem: Education: Goal: Knowledge of General Education information will improve Description: Including pain rating scale, medication(s)/side effects and non-pharmacologic comfort measures 03/25/2019 0018 by Tonny Bollman, RN Outcome: Progressing 03/25/2019 0018 by Tonny Bollman, RN Outcome: Progressing   Problem: Health Behavior/Discharge Planning: Goal: Ability to manage health-related needs will improve 03/25/2019 0018 by Tonny Bollman, RN Outcome: Progressing 03/25/2019 0018 by Tonny Bollman, RN Outcome: Progressing   Problem: Clinical Measurements: Goal: Ability to maintain clinical measurements within normal limits will improve Outcome: Progressing Goal: Will remain free from infection Outcome: Progressing Goal: Diagnostic test results will improve Outcome: Progressing Goal: Respiratory complications will improve Outcome: Progressing Goal: Cardiovascular complication will be avoided Outcome: Progressing   Problem: Activity: Goal: Risk for activity intolerance will decrease 03/25/2019 0018 by Tonny Bollman, RN Outcome: Progressing 03/25/2019 0018 by Tonny Bollman, RN Outcome: Progressing   Problem: Nutrition: Goal: Adequate nutrition will be maintained Outcome: Progressing   Problem: Coping: Goal: Level of anxiety will decrease 03/25/2019 0018 by Tonny Bollman, RN Outcome: Progressing 03/25/2019 0018 by Tonny Bollman, RN Outcome: Progressing   Problem: Elimination: Goal: Will not experience complications related to bowel motility 03/25/2019 0018 by Tonny Bollman, RN Outcome: Progressing 03/25/2019 0018 by Tonny Bollman, RN Outcome: Progressing Goal: Will not experience complications related to urinary retention Outcome: Progressing   Problem: Pain Managment: Goal: General experience of comfort will improve 03/25/2019 0018 by Tonny Bollman, RN Outcome: Progressing 03/25/2019 0018 by Tonny Bollman, RN Outcome: Progressing   Problem: Safety: Goal:  Ability to remain free from injury will improve Outcome: Progressing   Problem: Skin Integrity: Goal: Risk for impaired skin integrity will decrease Outcome: Progressing

## 2019-03-25 NOTE — Progress Notes (Addendum)
PROGRESS NOTE    OLAJUWON FOSDICK  QJJ:[id number] DOB: 09/05/1945 DOA: 03/24/2019 PCP: Binnie Rail, MD    Brief Narrative:   Noah Barnes is a 73 y.o. male with medical history significant of nephrolithiasis status post lithotripsy only last week, diastolic dysfunction CHF, diabetes, hypertension, gout, irritable bowel syndrome, diverticular disease and erectile dysfunction who came to the ER with progressive shortness of breath over the last couple of days.  He had his lithotripsy about a week ago.  He was in short stay and patient had anesthesia.  Patient returned today with shortness of breath and cough. Associated with some low-grade temperature.  He denied any significant sputum.  He also has some right-sided chest pain which was dull ache with occasional sharp pain in the area.  Highest was 6 out of 10.  Patient seen and evaluated in the ER.  Appears to have right lower lobe pneumonia on CT.  He has acute on chronic kidney disease so unable to get CT angiogram to rule out PE.  Patient is being admitted at this point with diagnoses of healthcare associated pneumonia possibly aspiration from his procedure last week..  ED Course: Temperature is 98.6 blood pressure 187/101 pulse 87 respiratory 30 oxygen sat 81% on room air currently 99% on 2 L.  White count is 8.0 hemoglobin 12.3 and platelets 256.  Sodium 139 potassium 5.1 chloride 109 CO2 of 19 with BUN 39 creatinine 3.15 his baseline is 1.7.  Calcium 9.7.  His albumin is 2.7.  Troponin XX 1 and 26 BNP 204 d-dimer is 19.  Chest x-ray showed no acute findings but CT chest without contrast showed patchy bilateral airspace opacities with tree-in-bud nodular density in the upper lobes bilaterally right greater than left more confluent consolidation in the right lower lobe.  Findings concerning for an pneumonia.  He has some nodule 5.6 mm in the left lower lobe.  CT abdomen renal stone study showed the airspace consolidation in the right lower lobe  with bilateral nephrolithiasis left greater than right.  He has 20 x 4 mm stone seen within the proximal left ureter with mild left hydronephrosis.   Assessment & Plan:   Principal Problem:   HCAP (healthcare-associated pneumonia) Active Problems:   Diabetes mellitus with renal complications ([place])   HYPERLIPIDEMIA   Essential hypertension   IBS (irritable bowel syndrome)   Acute on chronic kidney failure (HCC)   Chronic diastolic heart failure (HCC)   Right lower lobe pneumonia Patient presenting to the ED with progressive shortness of breath and cough.  Recently underwent lithotripsy with anesthesia 1 week ago.  Patient was noted to be hypoxic on room air oxygenating 81%.  Patient is afebrile without leukocytosis.  BNP 204, D-dimer elevated at 11.94. COVID-19 negative. 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 CT chest w/o contrast   Concerning for right lower lobe consolidation.  Strep pneumo urinary antigen negative.  Patient was started on vancomycin, Levaquin, and cefepime. Low probability PE with no tachycardia and review of EKG, no S in lead I, no Q wave in III, but twave inversion in lead III but similar in comparison to EKG dated 08/28/2018 on review. 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  Recently underwent  lithotripsy by Dr. Rolm Baptise. CT renal stone study on admission with   Bilateral nephrolithiasis, left greater than right with mild left hydronephrosis --Cr 3.15-->2.96 --avoid nephrotoxins, renally dose all medications --IV fluids, pain control --Tamsulosin 0.4 mg p.o. daily -- follow renal function daily  Pulmonary nodule, LLL Incidental finding on CT chest without  contrast, 5-6 mm nodule  Left lower lobe.  Recommend interval CT scan in 6-12 months.  T2DM On Glimepiride 2 mg PO daily and Janumet 50-1000 PO BID at home. Hemoglobin A1c 6.3 on 03/06/2019, well controlled. --hold oral hypoglycemics while inpatient --insulin sliding scale for coverage  HLD: continue statin  Chronic diastolic congestive heart failure, compensated Essential hypertension  BP 153/72 this morning. --continue clonidine 0.1 mg BID, metoprolol tartrate 100 mg BID, Hydralazine 25mg  BID --no ACEi secondary to renal function --on aspirin and statin  Chronic pain syndrome:  Continue tramadol/ Percocet prn  DVT prophylaxis:  heparin Code Status:  Full code Family Communication:  No family at bedside Disposition Plan:  Continue inpatient hospitalization, continue to attempt to titrate off supplemental oxygen daily, further dependent on clinical course   Consultants:   none  Procedures:   none  Antimicrobials:    vancomycin 9/21>>   cefepime 9/21>>   Levaquin 9/21 -9/22   Subjective: Patient seen examined at bedside, resting comfortably.  States breathing is improved,   But continues with significant shortness of breath and hypoxia when ambulated again today by nursing staff.  No other specific complaints at this time.  Denies headache, no fever/chills /night sweats, no nausea/vomiting /diarrhea, no chest pain, no palpitations, no abdominal pain, no weakness, no fatigue, no paresthesias.  No acute events overnight per nursing staff.  Objective: Vitals:   03/24/19 2355 03/25/19 0021 03/25/19 0133 03/25/19 0633  BP: (!) 185/87  (!) 153/72 122/77  Pulse: 85  88 (!) 55  Resp: 18   20  Temp: 100 F (37.8 C)   98.4 F (36.9 C)  TempSrc: Oral   Oral  SpO2: 100%   95%  Weight:  96.3 kg    Height:  5\' 8"  (1.727 m)      Intake/Output Summary (Last 24 hours) at 03/25/2019 0175 Last data filed at 03/25/2019 0541 Gross per 24 hour  Intake 1459.77 ml  Output 150 ml   Net 1309.77 ml   Filed Weights   03/25/19 0021  Weight: 96.3 kg    Examination:  General exam: Appears calm and comfortable  Respiratory system:   Decreased breath sounds bilateral bases, mild rhonchi right base, no wheezing, normal respiratory effort, on 3 L nasal cannula Cardiovascular system: S1 & S2 heard, RRR. No JVD, murmurs, rubs, gallops or clicks. No pedal edema. Gastrointestinal system: Abdomen is nondistended, soft and nontender. No organomegaly or masses felt. Normal bowel sounds heard. Central nervous system: Alert and oriented. No focal neurological deficits. Extremities: Symmetric 5 x 5 power. Skin: No rashes, lesions or ulcers Psychiatry: Judgement and insight appear normal. Mood & affect appropriate.     Data Reviewed: I have personally reviewed following labs and imaging studies  CBC: Recent Labs  Lab 03/24/19 1607  WBC 8.0  NEUTROABS 6.5  HGB 12.3*  HCT 40.8  MCV 88.3  PLT 102   Basic Metabolic Panel: Recent Labs  Lab 03/24/19 1607 03/25/19 0048  NA 139 141  K 5.1 5.1  CL 109 112*  CO2 19* 15*  GLUCOSE 140* 135*  BUN 39* 35*  CREATININE 3.15* 2.96*  CALCIUM 9.7 9.2  GFR: Estimated Creatinine Clearance: 25.4 mL/min (A) (by C-G formula based on SCr of 2.96 mg/dL (H)). Liver Function Tests: Recent Labs  Lab 03/24/19 1752 03/25/19 0048  AST 10* 13*  ALT 14 17  ALKPHOS 46 59  BILITOT 0.8 0.8  PROT 5.9* 6.9  ALBUMIN 2.7* 3.2*   No results for input(s): LIPASE, AMYLASE in the last 168 hours. No results for input(s): AMMONIA in the last 168 hours. Coagulation Profile: No results for input(s): INR, PROTIME in the last 168 hours. Cardiac Enzymes: No results for input(s): CKTOTAL, CKMB, CKMBINDEX, TROPONINI in the last 168 hours. BNP (last 3 results) No results for input(s): PROBNP in the last 8760 hours. HbA1C: No results for input(s): HGBA1C in the last 72 hours. CBG: Recent Labs  Lab 03/20/19 0625 03/25/19 0000  GLUCAP 141*  134*   Lipid Profile: No results for input(s): CHOL, HDL, LDLCALC, TRIG, CHOLHDL, LDLDIRECT in the last 72 hours. Thyroid Function Tests: No results for input(s): TSH, T4TOTAL, FREET4, T3FREE, THYROIDAB in the last 72 hours. Anemia Panel: No results for input(s): VITAMINB12, FOLATE, FERRITIN, TIBC, IRON, RETICCTPCT in the last 72 hours. Sepsis Labs: No results for input(s): PROCALCITON, LATICACIDVEN in the last 168 hours.  Recent Results (from the past 240 hour(s))  SARS Coronavirus 2 [facility] order, Performed in [facility] hospital lab) Nasopharyngeal Nasopharyngeal Swab     Status: None   Collection Time: 03/24/19  4:08 PM   Specimen: Nasopharyngeal Swab  Result Value Ref Range Status   SARS Coronavirus 2 NEGATIVE NEGATIVE Final    Comment: (NOTE) If result is NEGATIVE SARS-CoV-2 target nucleic acids are NOT DETECTED. The SARS-CoV-2 RNA is generally detectable in upper and lower  respiratory specimens during the acute phase of infection. The lowest  concentration of SARS-CoV-2 viral copies this assay can detect is 250  copies / mL. A negative result does not preclude SARS-CoV-2 infection  and should not be used as the sole basis for treatment or other  patient management decisions.  A negative result may occur with  improper specimen collection / handling, submission of specimen other  than nasopharyngeal swab, presence of viral mutation(s) within the  areas targeted by this assay, and inadequate number of viral copies  (<250 copies / mL). A negative result must be combined with clinical  observations, patient history, and epidemiological information. If result is POSITIVE SARS-CoV-2 target nucleic acids are DETECTED. The SARS-CoV-2 RNA is generally detectable in upper and lower  respiratory specimens dur ing the acute phase of infection.  Positive  results are indicative of active infection with SARS-CoV-2.  Clinical  correlation with patient history and other diagnostic  information is  necessary to determine patient infection status.  Positive results do  not rule out bacterial infection or co-infection with other viruses. If result is PRESUMPTIVE POSTIVE SARS-CoV-2 nucleic acids MAY BE PRESENT.   A presumptive positive result was obtained on the submitted specimen  and confirmed on repeat testing.  While 2019 novel coronavirus  (SARS-CoV-2) nucleic acids may be present in the submitted sample  additional confirmatory testing may be necessary for epidemiological  and / or clinical management purposes  to differentiate between  SARS-CoV-2 and other Sarbecovirus currently known to infect humans.  If clinically indicated additional testing with an alternate test  methodology [phone]) is advised. The SARS-CoV-2 RNA is generally  detectable in upper and lower respiratory sp ecimens during the acute  phase of infection. The expected result is Negative. Fact Sheet for Patients:  StrictlyIdeas.no  Fact Sheet for Healthcare Providers: BankingDealers.co.za This test is not yet approved or cleared by the [place] FDA and has been authorized for detection and/or diagnosis of SARS-CoV-2 by FDA under an Emergency Use Authorization (EUA).  This EUA will remain in effect (meaning this test can be used) for the duration of the COVID-19 declaration under Section 564(b)(1) of the Act, 21 U.S.C. section 360bbb-3(b)(1), unless the authorization is terminated or revoked sooner. Performed at [facility], [place] [address], [place], [place] [address]   Blood culture (routine x 2)     Status: None (Preliminary result)   Collection Time: 03/25/19 12:48 AM   Specimen: BLOOD RIGHT HAND  Result Value Ref Range Status   Specimen Description   Final    BLOOD RIGHT HAND Performed at [facility], [place] [address] [place], [place] [address]    Special Requests   Final    BOTTLES DRAWN AEROBIC ONLY Blood  Culture adequate volume Performed at [place] [address], [place],  [address]    Culture PENDING  Incomplete   Report Status PENDING  Incomplete         Radiology Studies: Ct Chest Wo Contrast  Result Date: 03/24/2019 CLINICAL DATA:  Cough, chest pain, shortness of breath EXAM: CT CHEST WITHOUT CONTRAST TECHNIQUE: Multidetector CT imaging of the chest was performed following the standard protocol without IV contrast. COMPARISON:  Chest x-ray earlier today FINDINGS: Cardiovascular: Scattered aortic and coronary artery calcifications. Heart is normal size. Aorta is normal caliber. Mediastinum/Nodes: No mediastinal, hilar, or axillary adenopathy. Lungs/Pleura: Patchy airspace opacities in the upper lobes, right greater than left, most pronounced peripherally. Some associated tree-in-bud nodular densities in both upper lobes. Areas of consolidation noted in the right lower lobe/lung base. Probable left base atelectasis. Small right pleural effusion. Upper Abdomen: Imaging into the upper abdomen shows no acute findings. Musculoskeletal: Chest wall soft tissues are unremarkable. No acute bony abnormality. IMPRESSION: Patchy bilateral airspace opacities with tree-in-bud nodular densities in the upper lobes bilaterally, right greater than left. More confluent consolidation in the right lower lobe. Findings concerning for pneumonia. 5-6 mm nodule in the left lower lobe along the fissure. Non-contrast chest CT at 6-12 months is recommended. If the nodule is stable at time of repeat CT, then future CT at 18-24 months (from today's scan) is considered optional for low-risk patients, but is recommended for high-risk patients. This recommendation follows the consensus statement: Guidelines for Management of Incidental Pulmonary Nodules Detected on CT Images: From the Fleischner Society 2017; Radiology 2017; 284:228-243. Small right pleural effusion. Coronary artery disease. Aortic  Atherosclerosis (ICD10-I70.0). Electronically Signed   By: Rolm Baptise M.D.   On: 03/24/2019 19:51   Dg Chest Portable 1 View  Result Date: 03/24/2019 CLINICAL DATA:  Cough and right chest pain since yesterday. EXAM: PORTABLE CHEST 1 VIEW COMPARISON:  PA and lateral chest 09/05/2016. FINDINGS: Lung volumes are low but the lungs are clear. Elevation the right hemidiaphragm relative to the left noted. Heart size is upper normal. No pneumothorax or pleural fluid. IMPRESSION: No acute disease. Electronically Signed   By: Inge Rise M.D.   On: 03/24/2019 16:39   Ct Renal Stone Study  Result Date: 03/24/2019 CLINICAL DATA:  Cough, right side pain. EXAM: CT ABDOMEN AND PELVIS WITHOUT CONTRAST TECHNIQUE: Multidetector CT imaging of the abdomen and pelvis was performed following the standard protocol without IV contrast. COMPARISON:  03/02/2019 FINDINGS: Lower chest: Airspace opacities in the right lower  lobe concerning for pneumonia. Left base atelectasis. Trace right effusion. Hepatobiliary: No focal hepatic abnormality. Gallbladder unremarkable. Pancreas: No focal abnormality or ductal dilatation. Spleen: No focal abnormality.  Normal size. Adrenals/Urinary Tract: Multiple left renal stones are noted. Elongated stone is seen within the proximal left ureter with a cross-sectional diameter of 4 mm, extending over approximately 2 cm length. There is mild left hydronephrosis. Small nonobstructing stone in the lower pole of the right kidney. No hydronephrosis on the right. Urinary bladder and adrenal glands unremarkable. Stomach/Bowel: Sigmoid diverticulosis. No active diverticulitis. Normal appendix. Stomach and small bowel decompressed, unremarkable. Vascular/Lymphatic: Aortic atherosclerosis. No enlarged abdominal or pelvic lymph nodes. Reproductive: No visible focal abnormality. Other: No free fluid or free air. Musculoskeletal: No acute bony abnormality. IMPRESSION: Airspace consolidation in the right lower  lobe with trace right effusion. Findings concerning for pneumonia. Bilateral nephrolithiasis, left greater than right. 20 x 4 mm elongated stone or stones seen within the proximal left ureter with mild left hydronephrosis. Aortic atherosclerosis. Electronically Signed   By: Rolm Baptise M.D.   On: 03/24/2019 19:56        Scheduled Meds: . allopurinol  300 mg Oral Daily  . aspirin EC  81 mg Oral q morning - 10a  . atorvastatin  20 mg Oral Daily  . cloNIDine  0.1 mg Oral BID  . glimepiride  2 mg Oral Q breakfast  . heparin  5,000 Units Subcutaneous Q8H  . hydrALAZINE  25 mg Oral BID  . insulin aspart  0-5 Units Subcutaneous QHS  . insulin aspart  0-9 Units Subcutaneous TID WC  . linaclotide  290 mcg Oral QAC breakfast  . metoprolol tartrate  100 mg Oral BID  . tamsulosin  0.4 mg Oral QPC supper   Continuous Infusions: . sodium chloride 100 mL/hr at 03/25/19 0038  . ceFEPime (MAXIPIME) IV    . levofloxacin (LEVAQUIN) IV    . [START ON 03/27/2019] vancomycin       LOS: 1 day     Time spent: 39 minutes spent on chart review, discussion with nursing staff, consultants, updating family and interview/physical exam; more than 50% of that time was spent in counseling and/or coordination of care.     Tanise Russman J [place] DO [facility] Pager [phone]  If 7PM-7AM, please contact night-coverage [email or web address] Password [facility] 03/25/2019, 6:38 AM

## 2019-03-25 NOTE — Evaluation (Signed)
Physical Therapy Evaluation Patient Details Name: Noah Barnes MRN: [id number] DOB: [birth year] Today's Date: 03/25/2019   History of Present Illness  73 yo male admitted to ED on 9/21 with RLL PNA. Pt with recent history of L lithotripsy on 9/17. PMH includes CHF, DM, diverticulosis, heart murmur, HLD, HTN, IBS, nephrolithiasis, BLE edema, tubular adenoma of colon.  Clinical Impression  Pt presents with dyspnea on exertion with O2 desaturation, increased time and effort to perform mobility tasks, and decreased activity tolerance. Pt to benefit from acute PT to address deficits. Pt ambulated hallway distance with slow, steady gait without use of AD, pt's main limiting factor was respiratory status. PT suspects pt will progress well with mobility, no PT follow up recommended at this time but will follow acutely to ensure pt maintains and progresses mobility. PT to progress mobility as tolerated.  SpO2 on 2-3LO2 during ambulation: 83% (increased to 3LO2) -94%     Follow Up Recommendations No PT follow up    Equipment Recommendations  None recommended by PT    Recommendations for Other Services       Precautions / Restrictions Precautions Precautions: Fall Restrictions Weight Bearing Restrictions: No      Mobility  Bed Mobility Overal bed mobility: Modified Independent             General bed mobility comments: increased time, assist for IV line.  Transfers Overall transfer level: Needs assistance Equipment used: Rolling walker (2 wheeled) Transfers: Sit to/from Stand Sit to Stand: Supervision         General transfer comment: supervision for safety, increased time to rise.  Ambulation/Gait Ambulation/Gait assistance: Supervision;Min guard Gait Distance (Feet): 220 Feet Assistive device: None Gait Pattern/deviations: Step-through pattern;WFL(Within Functional Limits) Gait velocity: decr   General Gait Details: slow, steady gait without use of AD. Pt difficulty  speaking to PT at 100 ft, with SpO2 at 86% on 2LO2. Pt unable to recover sats >90% with breathing techniques, O2 increased to 3LO2. SpO2 range during ambulation 83%-94%.  Stairs            Wheelchair Mobility    Modified Rankin (Stroke Patients Only)       Balance Overall balance assessment: Mild deficits observed, not formally tested                                           Pertinent Vitals/Pain Pain Assessment: No/denies pain    Home Living Family/patient expects to be discharged to:: Private residence Living Arrangements: Spouse/significant other Available Help at Discharge: Family;Available PRN/intermittently(wife works) Type of Home: House Home Access: Stairs to enter   Technical brewer of Steps: a couple Interior and spatial designer: Laundry or work area in [employer]: None      Prior Function Level of Independence: Independent               Journalist, newspaper   Dominant Hand: Right    Extremity/Trunk Assessment   Upper Extremity Assessment Upper Extremity Assessment: Overall WFL for tasks assessed    Lower Extremity Assessment Lower Extremity Assessment: Overall WFL for tasks assessed    Cervical / Trunk Assessment Cervical / Trunk Assessment: Normal  Communication   Communication: No difficulties  Cognition Arousal/Alertness: Awake/alert Behavior During Therapy: WFL for tasks assessed/performed Overall Cognitive Status: Within Functional Limits for tasks assessed  General Comments      Exercises     Assessment/Plan    PT Assessment Patient needs continued PT services  PT Problem List Decreased mobility;Decreased activity tolerance;Cardiopulmonary status limiting activity       PT Treatment Interventions DME instruction;Therapeutic activities;Gait training;Therapeutic exercise;Patient/family education;Stair training;Balance training;Functional mobility  training    PT Goals (Current goals can be found in the Care Plan section)  Acute Rehab PT Goals Patient Stated Goal: go home, breathe better PT Goal Formulation: With patient Time For Goal Achievement: 04/08/19 Potential to Achieve Goals: Good    Frequency Min 3X/week   Barriers to discharge        Co-evaluation               AM-PAC PT "6 Clicks" Mobility  Outcome Measure Help needed turning from your back to your side while in a flat bed without using bedrails?: None Help needed moving from lying on your back to sitting on the side of a flat bed without using bedrails?: None Help needed moving to and from a bed to a chair (including a wheelchair)?: A Little Help needed standing up from a chair using your arms (e.g., wheelchair or bedside chair)?: A Little Help needed to walk in hospital room?: A Little Help needed climbing 3-5 steps with a railing? : A Little 6 Click Score: 20    End of Session Equipment Utilized During Treatment: Gait belt Activity Tolerance: Patient tolerated treatment well Patient left: in bed;with call bell/phone within reach;with bed alarm set Nurse Communication: Mobility status PT Visit Diagnosis: Other abnormalities of gait and mobility (R26.89)    Time: 1884-1660 PT Time Calculation (min) (ACUTE ONLY): 15 min   Charges:   PT Evaluation $PT Eval Low Complexity: 1 Low          Julien Girt, PT Acute Rehabilitation Services Pager [phone]  Office [phone]   Waleed Dettman D Elonda Husky 03/25/2019, 4:45 PM

## 2019-03-25 NOTE — Progress Notes (Signed)
Attempted to wean patient O2 - patient ambulated to BR on RA and sats dropped to 78%.  Patient slightly SOB.  2L O2 applied and sats increased to 90%, further increased to 2.5L to maintain at 92%

## 2019-03-26 ENCOUNTER — Inpatient Hospital Stay (HOSPITAL_COMMUNITY): Payer: Medicare Other

## 2019-03-26 DIAGNOSIS — K581 Irritable bowel syndrome with constipation: Secondary | ICD-10-CM

## 2019-03-26 DIAGNOSIS — E782 Mixed hyperlipidemia: Secondary | ICD-10-CM

## 2019-03-26 DIAGNOSIS — E1129 Type 2 diabetes mellitus with other diabetic kidney complication: Secondary | ICD-10-CM

## 2019-03-26 DIAGNOSIS — R0989 Other specified symptoms and signs involving the circulatory and respiratory systems: Secondary | ICD-10-CM

## 2019-03-26 DIAGNOSIS — N183 Chronic kidney disease, stage 3 (moderate): Secondary | ICD-10-CM

## 2019-03-26 DIAGNOSIS — I5032 Chronic diastolic (congestive) heart failure: Secondary | ICD-10-CM

## 2019-03-26 DIAGNOSIS — N179 Acute kidney failure, unspecified: Secondary | ICD-10-CM

## 2019-03-26 DIAGNOSIS — I361 Nonrheumatic tricuspid (valve) insufficiency: Secondary | ICD-10-CM

## 2019-03-26 DIAGNOSIS — I82403 Acute embolism and thrombosis of unspecified deep veins of lower extremity, bilateral: Secondary | ICD-10-CM

## 2019-03-26 DIAGNOSIS — R609 Edema, unspecified: Secondary | ICD-10-CM

## 2019-03-26 DIAGNOSIS — I10 Essential (primary) hypertension: Secondary | ICD-10-CM

## 2019-03-26 LAB — CBC
HCT: 34.8 % — ABNORMAL LOW (ref 39.0–52.0)
Hemoglobin: 10.3 g/dL — ABNORMAL LOW (ref 13.0–17.0)
MCH: 26.5 pg (ref 26.0–34.0)
MCHC: 29.6 g/dL — ABNORMAL LOW (ref 30.0–36.0)
MCV: 89.5 fL (ref 80.0–100.0)
Platelets: 239 10*3/uL (ref 150–400)
RBC: 3.89 MIL/uL — ABNORMAL LOW (ref 4.22–5.81)
RDW: 13.4 % (ref 11.5–15.5)
WBC: 7.8 10*3/uL (ref 4.0–10.5)
nRBC: 0 % (ref 0.0–0.2)

## 2019-03-26 LAB — BASIC METABOLIC PANEL
Anion gap: 9 (ref 5–15)
BUN: 32 mg/dL — ABNORMAL HIGH (ref 8–23)
CO2: 16 mmol/L — ABNORMAL LOW (ref 22–32)
Calcium: 8.7 mg/dL — ABNORMAL LOW (ref 8.9–10.3)
Chloride: 115 mmol/L — ABNORMAL HIGH (ref 98–111)
Creatinine, Ser: 2.58 mg/dL — ABNORMAL HIGH (ref 0.61–1.24)
GFR calc Af Amer: 28 mL/min — ABNORMAL LOW (ref >60)
GFR calc non Af Amer: 24 mL/min — ABNORMAL LOW (ref >60)
Glucose, Bld: 116 mg/dL — ABNORMAL HIGH (ref 70–99)
Potassium: 5.1 mmol/L (ref 3.5–5.1)
Sodium: 140 mmol/L (ref 135–145)

## 2019-03-26 LAB — GLUCOSE, CAPILLARY
Glucose-Capillary: 111 mg/dL — ABNORMAL HIGH (ref 70–99)
Glucose-Capillary: 137 mg/dL — ABNORMAL HIGH (ref 70–99)
Glucose-Capillary: 105 mg/dL — ABNORMAL HIGH (ref 70–99)
Glucose-Capillary: 120 mg/dL — ABNORMAL HIGH (ref 70–99)

## 2019-03-26 LAB — APTT: aPTT: 34 seconds (ref 24–36)

## 2019-03-26 LAB — ECHOCARDIOGRAM COMPLETE
Height: 68 in
Weight: 3472 oz

## 2019-03-26 LAB — PROTIME-INR
INR: 1.2 (ref 0.8–1.2)
Prothrombin Time: 14.9 seconds (ref 11.4–15.2)

## 2019-03-26 LAB — MAGNESIUM: Magnesium: 1.7 mg/dL (ref 1.7–2.4)

## 2019-03-26 LAB — TROPONIN I (HIGH SENSITIVITY)
Troponin I (High Sensitivity): 31 ng/L — ABNORMAL HIGH (ref <18)
Troponin I (High Sensitivity): 34 ng/L — ABNORMAL HIGH (ref <18)

## 2019-03-26 LAB — HEPARIN LEVEL (UNFRACTIONATED): Heparin Unfractionated: 0.64 IU/mL (ref 0.30–0.70)

## 2019-03-26 LAB — D-DIMER, QUANTITATIVE: D-Dimer, Quant: 11.14 ug/mL-FEU — ABNORMAL HIGH (ref 0.00–0.50)

## 2019-03-26 MED ORDER — HEPARIN BOLUS VIA INFUSION
2000.0000 [IU] | Freq: Once | INTRAVENOUS | Status: AC
  Administered 2019-03-26: 2000 [IU] via INTRAVENOUS
  Filled 2019-03-26: qty 2000

## 2019-03-26 MED ORDER — SODIUM CHLORIDE 0.9 % IV SOLN
INTRAVENOUS | Status: DC
  Administered 2019-03-26: 16:00:00 via INTRAVENOUS

## 2019-03-26 MED ORDER — HEPARIN (PORCINE) 25000 UT/250ML-% IV SOLN
1450.0000 [IU]/h | INTRAVENOUS | Status: AC
  Administered 2019-03-26 – 2019-03-29 (×5): 1450 [IU]/h via INTRAVENOUS
  Filled 2019-03-26 (×5): qty 250

## 2019-03-26 NOTE — Progress Notes (Signed)
Bilateral lower extremity venous duplex has been completed. Preliminary results can be found in CV Proc through chart review.  Results were given to the patient's nurse, Megan.  03/26/19 8:46 AM Noah Barnes RVT

## 2019-03-26 NOTE — Progress Notes (Signed)
Echocardiogram 2D Echocardiogram has been performed.  Noah Barnes 03/26/2019, 1:49 PM

## 2019-03-26 NOTE — Progress Notes (Signed)
PROGRESS NOTE    Noah Barnes  FYB:[id number] DOB: 1945-10-14 DOA: 03/24/2019 PCP: Binnie Rail, MD   Brief Narrative:  [name] a 73 y.o.malewith medical history significant ofnephrolithiasis status post lithotripsy only last week, diastolic dysfunction CHF, diabetes, hypertension, gout, irritable bowel syndrome, diverticular disease and erectile dysfunction who came to the ER with progressive shortness of breath over the last couple of days. He had his lithotripsy about a week ago. He was in short stay and patient had anesthesia. Patient returned today with shortness of breath and cough. Associated with some low-grade temperature. He denied any significant sputum. He also has some right-sided chest pain which was dull ache with occasional sharp pain in the area. Highest was 6 out of 10. Patient seen and evaluated in the ER. Appears to have right lower lobe pneumonia on CT. He has acute on chronic kidney disease so unable to get CT angiogram to rule out PE. Patient is being admitted at this point with diagnoses of healthcare associated pneumonia possibly aspiration from his procedure last week..  ED Course:Temperature is 98.6 blood pressure 187/101 pulse 87 respiratory 30 oxygen sat 81% on room air currently 99% on 2 L. White count is 8.0 hemoglobin 12.3 and platelets 256. Sodium 139 potassium 5.1 chloride 109 CO2 of 19 with BUN 39 creatinine 3.15 his baseline is 1.7. Calcium 9.7.His albumin is 2.7. Troponin XX 1 and 26 BNP 204 d-dimer is 19. Chest x-ray showed no acute findings but CT chest without contrast showed patchy bilateral airspace opacities with tree-in-bud nodular density in the upper lobes bilaterally right greater than left more confluent consolidation in the right lower lobe. Findings concerning for an pneumonia. He has some nodule 5.6 mm in the left lower lobe. CT abdomen renal stone study showed the airspace consolidation in the right lower lobe with  bilateral nephrolithiasis left greater than right. He has 20 x 4 mm stone seen within the proximal left ureter with mild left hydronephrosis.  **Interim History  Patient's VQ scan was read as intermediate probability for PE on examination today he did have some swelling bilaterally.  Lower extremity venous duplex was done and did show bilateral DVTs.  Patient was started on a heparin drip and renal function is trending down with IV hydration however will reduce fluid rate to 50 mL's per hour for 10 more hours given his history of diastolic CHF.   Assessment & Plan:   Principal Problem:   HCAP (healthcare-associated pneumonia) Active Problems:   Diabetes mellitus with renal complications ([place])   HYPERLIPIDEMIA   Essential hypertension   IBS (irritable bowel syndrome)   Acute on chronic kidney failure (HCC)   Chronic diastolic heart failure (HCC)  Right lower lobe pneumonia with acute respiratory failure with hypoxia -Patient presenting to the ED with progressive shortness of breath and cough.  Recently underwent lithotripsy with anesthesia 1 week ago.   -Patient was noted to be hypoxic on room air oxygenating 81%.   -Patient was afebrile without leukocytosis but spiked a temperature of 100.4.  BNP 204, D-dimer elevated at 11.94. COVID-19 negative. 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  -CT chest w/o contrast Concerning for right lower lobe consolidation.  Strep pneumo urinary antigen negative.   -Patient was started on vancomycin, Levaquin, and cefepime.  -Initially thought to have Low probability PE with no tachycardia and review of EKG, no S in lead I, no Q wave in III, but twave inversion in lead III but similar in comparison to  EKG dated 08/28/2018 on review but now given DVT's and V/Q Scan suspect PE -Discontinued Levaquin and Discontinued IV Vancomycin given Negative PCR -C/w IV Cefepime  -Currently on 2.5 L nasal cannula, continue to titrate supplemental oxygen to maintain SpO2 >92% -Will  Need home Ambulatory screen prior to discharge and yesterday he desaturated down to 78% on room air  Elevated D-Dimer with Suspected PE in the setting of Bilateral DVT's -D-Dimer was elevated on admission at 11.94 and repeat was 11.14 -V/Q showed "Triple matched abnormal ventilation, perfusion and radiographic abnormalities in the RIGHT lower lobe.  Slightly larger perfusion defect and ventilation defect at the lateral aspect of the lingula, with only minimal subsegmental atelectasis in the lingula on prior CT. Findings represent an intermediate probability for pulmonary embolism." -LE Duplex Preliminary Results show an acute right DVT involving the right posterior tibial veins with no cystic structure follow popliteal fossa and there is also an acute left-sided DVT involving the left popliteal vein with no cystic structure found in the popliteal fossa -Check ECHOCardiogram to evaluate Right Heart Strain  -Anticoagulate with Heparin gtt for now and then transition to oral NOAC -Continue supplemental oxygen via nasal cannula and wean O2 as tolerated -We will need an ambulatory home oxygen to screen prior to discharge  Acute on Chronic [id number] Metabolic Acidosis  Nephrolithiasis -Baseline creatinine 1.4-1.6 with GFR 49.  Recently underwent lithotripsy by Dr. Rolm Baptise. CT renal stone study on admission with Bilateral nephrolithiasis, left greater than right with mild left hydronephrosis -Patient's BUN/Cr is trending down and went from 39/3.15 and is now 32/2.58 -Avoid nephrotoxins, renally dose all medications and avoid contrast dyes as well as hypotension if possible --IV fluids and rate was decreased down to 50 mL's per hour given that he is also found to be started on a heparin drip and has diastolic CHF, pain control as below -Continue with tamsulosin 0.4 mg p.o. daily -Continue monitor and trend renal function daily and repeat CMP in a.m.  Pulmonary nodule, LLL -Incidental finding on CT  chest without contrast, 5-6 mm nodule  Left lower lobe.   -Recommend interval CT scan in 6-12 months.  T2DM -On Glimepiride 2 mg PO daily and Janumet 50-1000 PO BID at home. -Hemoglobin A1c 6.3 on 03/06/2019, well controlled. -Hold oral hypoglycemics while inpatient and I stopped his Glimperide as he was still on it this AM  -C/w Sensitive Novolog SSI AC/HS -CBGs ranging from 111-138  HLD -Continue Atorvastatin 20 mg po Daily   Chronic Diastolic congestive heart failure, compensated currently  Essential Hypertension  BP 153/72 this morning. -Continue Clonidine 0.1 mg BID, metoprolol tartrate 100 mg BID, Hydralazine 25mg  BID -no ACEi secondary to renal function -Strict I's and O's and daily weights and continue to monitor her volume status carefully We will repeat echocardiogram to evaluate right heart strain given newly diagnosed DVTs -On aspirin and statin  Chronic Pain Syndrome -Continue Tramadol 50-100 mg po q12hprn Moderate -C/w Oxycodone-Acetaminophen 1 tab po q4hprn Moderate and Severe Pain   Normocephalic Anemia -Patient's hemoglobin/hematocrit went from 2.3/40.8 is now 10.3/34.8 -Check anemia panel in the a.m. -Continue to monitor for signs and symptoms of bleeding now that he is anticoagulated with a heparin drip; currently no overt bleeding noted -Repeat CBC in a.m.  Obesity -Estimated body mass index is 32.99 kg/m as calculated from the following:   Height as of this encounter: 5\' 8"  (1.727 m).   Weight as of this encounter: 98.4 kg. -Weight Loss and Dietary Counseling given  DVT prophylaxis: Anticoagulated with Heparin gtt Code Status: FULL CODE  Family Communication: No family present at bedside  Disposition Plan: Pending further workup and evaluation   Consultants:   None   Procedures:  LE VENOUS DUPLEX PRELIMINARY RESULTS Summary: Right: Findings consistent with acute deep vein thrombosis involving the right posterior tibial veins. No cystic  structure found in the popliteal fossa. Left: Findings consistent with acute deep vein thrombosis involving the left popliteal vein. No cystic structure found in the popliteal fossa.  ECHOCARDIOGRAM -Done and Pending Read   Antimicrobials:  Anti-infectives (From admission, onward)   Start     Dose/Rate Route Frequency Ordered Stop   03/27/19 1000  vancomycin (VANCOCIN) IVPB 1000 mg/200 mL premix  Status:  Discontinued     1,000 mg 200 mL/hr over 60 Minutes Intravenous Every 24 hours 03/25/19 0214 03/25/19 1613   03/26/19 2200  vancomycin (VANCOCIN) IVPB 1000 mg/200 mL premix  Status:  Discontinued     1,000 mg 200 mL/hr over 60 Minutes Intravenous Every 48 hours 03/25/19 1613 03/26/19 0756   03/25/19 2200  ceFEPIme (MAXIPIME) 2 g in sodium chloride 0.9 % 100 mL IVPB     2 g 200 mL/hr over 30 Minutes Intravenous Every 24 hours 03/25/19 0214     03/25/19 1800  levofloxacin (LEVAQUIN) IVPB 750 mg  Status:  Discontinued     750 mg 100 mL/hr over 90 Minutes Intravenous Every 48 hours 03/24/19 2211 03/25/19 1331   03/24/19 2215  ceFEPIme (MAXIPIME) 2 g in sodium chloride 0.9 % 100 mL IVPB     2 g 200 mL/hr over 30 Minutes Intravenous  Once 03/24/19 2209 03/24/19 2256   03/24/19 2215  vancomycin (VANCOCIN) 2,000 mg in sodium chloride 0.9 % 500 mL IVPB     2,000 mg 250 mL/hr over 120 Minutes Intravenous  Once 03/24/19 2209 03/25/19 0038   03/24/19 2030  cefTRIAXone (ROCEPHIN) 1 g in sodium chloride 0.9 % 100 mL IVPB  Status:  Discontinued     1 g 200 mL/hr over 30 Minutes Intravenous  Once 03/24/19 2018 03/24/19 2209   03/24/19 2030  azithromycin (ZITHROMAX) 500 mg in sodium chloride 0.9 % 250 mL IVPB     500 mg 250 mL/hr over 60 Minutes Intravenous  Once 03/24/19 2018 03/24/19 2221     Subjective: Seen and examined at bedside and states that he had no shortness of breath currently.  No chest pain, lightheadedness or dizziness.  Did have some lower extremity swelling and does not wear  oxygen still remains on oxygen currently.  No other concerns or complaints at this time.  Objective: Vitals:   03/25/19 1326 03/25/19 2150 03/26/19 0423 03/26/19 0432  BP: 128/80 (!) 166/83 (!) 151/88   Pulse: 63 82 82   Resp: 16 18 20    Temp: (!) 97.4 F (36.3 C) (!) 100.4 F (38 C) 99.2 F (37.3 C)   TempSrc: Oral Oral Oral   SpO2: 93% 94% 91%   Weight:    98.4 kg  Height:        Intake/Output Summary (Last 24 hours) at 03/26/2019 1401 Last data filed at 03/26/2019 1110 Gross per 24 hour  Intake 3700.14 ml  Output -  Net 3700.14 ml   Filed Weights   03/25/19 0021 03/26/19 0432  Weight: 96.3 kg 98.4 kg   Examination: Physical Exam:  Constitutional: WN/WD obese AAM in NAD and appears mildly agitated Eyes: Lids and conjunctivae normal, sclerae anicteric  ENMT: External Ears, Nose  appear normal. Grossly normal hearing. Mucous membranes are moist.  Neck: Appears normal, supple, no cervical masses, normal ROM, no appreciable thyromegaly; no JVD Respiratory: Diminished to auscultation bilaterally but worse on the Right no wheezing, rales, rhonchi or crackles. Normal respiratory effort and patient is not tachypenic. No accessory muscle use. Wearing supplemental O2 via [place] Cardiovascular: RRR, no murmurs / rubs / gallops. S1 and S2 auscultated. 1+ LE extremity edema with Right slightly worse than Left.  Abdomen: Soft, non-tender, non-distended. No masses palpated. No appreciable hepatosplenomegaly. Bowel sounds positive.  GU: Deferred. Musculoskeletal: No clubbing / cyanosis of digits/nails.  Normal strength and muscle tone.  Skin: No rashes, lesions, ulcers on a limited skin evaluation. No induration; Warm and dry.  Neurologic: CN 2-12 grossly intact with no focal deficits. Romberg sign and cerebellar reflexes not assessed.  Psychiatric: Normal judgment and insight. Alert and oriented x 3. Anxious mood and appropriate affect.   Data Reviewed: I have personally reviewed following  labs and imaging studies  CBC: Recent Labs  Lab 03/24/19 1607 03/26/19 0428  WBC 8.0 7.8  NEUTROABS 6.5  --   HGB 12.3* 10.3*  HCT 40.8 34.8*  MCV 88.3 89.5  PLT 256 528   Basic Metabolic Panel: Recent Labs  Lab 03/24/19 1607 03/25/19 0048 03/26/19 0428  NA 139 141 140  K 5.1 5.1 5.1  CL 109 112* 115*  CO2 19* 15* 16*  GLUCOSE 140* 135* 116*  BUN 39* 35* 32*  CREATININE 3.15* 2.96* 2.58*  CALCIUM 9.7 9.2 8.7*  MG  --   --  1.7   GFR: Estimated Creatinine Clearance: 29.4 mL/min (A) (by C-G formula based on SCr of 2.58 mg/dL (H)). Liver Function Tests: Recent Labs  Lab 03/24/19 1752 03/25/19 0048  AST 10* 13*  ALT 14 17  ALKPHOS 46 59  BILITOT 0.8 0.8  PROT 5.9* 6.9  ALBUMIN 2.7* 3.2*   No results for input(s): LIPASE, AMYLASE in the last 168 hours. No results for input(s): AMMONIA in the last 168 hours. Coagulation Profile: Recent Labs  Lab 03/26/19 0824  INR 1.2   Cardiac Enzymes: No results for input(s): CKTOTAL, CKMB, CKMBINDEX, TROPONINI in the last 168 hours. BNP (last 3 results) No results for input(s): PROBNP in the last 8760 hours. HbA1C: No results for input(s): HGBA1C in the last 72 hours. CBG: Recent Labs  Lab 03/25/19 1122 03/25/19 1651 03/25/19 2150 03/26/19 0805 03/26/19 1154  GLUCAP 126* 138* 118* 111* 137*   Lipid Profile: No results for input(s): CHOL, HDL, LDLCALC, TRIG, CHOLHDL, LDLDIRECT in the last 72 hours. Thyroid Function Tests: No results for input(s): TSH, T4TOTAL, FREET4, T3FREE, THYROIDAB in the last 72 hours. Anemia Panel: No results for input(s): VITAMINB12, FOLATE, FERRITIN, TIBC, IRON, RETICCTPCT in the last 72 hours. Sepsis Labs: No results for input(s): PROCALCITON, LATICACIDVEN in the last 168 hours.  Recent Results (from the past 240 hour(s))  SARS Coronavirus 2 [facility] order, Performed in [facility] hospital lab) Nasopharyngeal Nasopharyngeal Swab     Status: None   Collection Time: 03/24/19  4:08 PM    Specimen: Nasopharyngeal Swab  Result Value Ref Range Status   SARS Coronavirus 2 NEGATIVE NEGATIVE Final    Comment: (NOTE) If result is NEGATIVE SARS-CoV-2 target nucleic acids are NOT DETECTED. The SARS-CoV-2 RNA is generally detectable in upper and lower  respiratory specimens during the acute phase of infection. The lowest  concentration of SARS-CoV-2 viral copies this assay can detect is 250  copies / mL.  A negative result does not preclude SARS-CoV-2 infection  and should not be used as the sole basis for treatment or other  patient management decisions.  A negative result may occur with  improper specimen collection / handling, submission of specimen other  than nasopharyngeal swab, presence of viral mutation(s) within the  areas targeted by this assay, and inadequate number of viral copies  (<250 copies / mL). A negative result must be combined with clinical  observations, patient history, and epidemiological information. If result is POSITIVE SARS-CoV-2 target nucleic acids are DETECTED. The SARS-CoV-2 RNA is generally detectable in upper and lower  respiratory specimens dur ing the acute phase of infection.  Positive  results are indicative of active infection with SARS-CoV-2.  Clinical  correlation with patient history and other diagnostic information is  necessary to determine patient infection status.  Positive results do  not rule out bacterial infection or co-infection with other viruses. If result is PRESUMPTIVE POSTIVE SARS-CoV-2 nucleic acids MAY BE PRESENT.   A presumptive positive result was obtained on the submitted specimen  and confirmed on repeat testing.  While 2019 novel coronavirus  (SARS-CoV-2) nucleic acids may be present in the submitted sample  additional confirmatory testing may be necessary for epidemiological  and / or clinical management purposes  to differentiate between  SARS-CoV-2 and other Sarbecovirus currently known to infect humans.  If  clinically indicated additional testing with an alternate test  methodology [phone]) is advised. The SARS-CoV-2 RNA is generally  detectable in upper and lower respiratory sp ecimens during the acute  phase of infection. The expected result is Negative. Fact Sheet for Patients:  StrictlyIdeas.no Fact Sheet for Healthcare Providers: BankingDealers.co.za This test is not yet approved or cleared by the [place] FDA and has been authorized for detection and/or diagnosis of SARS-CoV-2 by FDA under an Emergency Use Authorization (EUA).  This EUA will remain in effect (meaning this test can be used) for the duration of the COVID-19 declaration under Section 564(b)(1) of the Act, 21 U.S.C. section 360bbb-3(b)(1), unless the authorization is terminated or revoked sooner. Performed at [facility], [place] [address] [place], [place] [address]   Blood culture (routine x 2)     Status: None (Preliminary result)   Collection Time: 03/24/19  9:15 PM   Specimen: BLOOD  Result Value Ref Range Status   Specimen Description   Final    BLOOD RIGHT ANTECUBITAL Performed at [facility] [address], [place], [place] [address]    Special Requests   Final    BOTTLES DRAWN AEROBIC AND ANAEROBIC Blood Culture adequate volume Performed at [facility] [address], [place], [place] [address]    Culture   Final    NO GROWTH 2 DAYS Performed at [facility] [address], [place], [place] [address]    Report Status PENDING  Incomplete  Blood culture (routine x 2)     Status: None (Preliminary result)   Collection Time: 03/25/19 12:48 AM   Specimen: BLOOD RIGHT HAND  Result Value Ref Range Status   Specimen Description   Final    BLOOD RIGHT HAND Performed at [facility], [place] [address], [place], [place] [address]    Special Requests   Final    BOTTLES DRAWN AEROBIC ONLY  Blood Culture adequate volume Performed at [facility] [address] [place], [place] [address]    Culture   Final  NO GROWTH 1 DAY Performed at [facility], [place] [address] [place], [place] [address]    Report Status PENDING  Incomplete  MRSA PCR Screening     Status: None   Collection Time: 03/25/19  1:31 PM   Specimen: Nasal Mucosa; Nasopharyngeal  Result Value Ref Range Status   MRSA by PCR NEGATIVE NEGATIVE Final    Comment:        The GeneXpert MRSA Assay (FDA approved for NASAL specimens only), is one component of a comprehensive MRSA colonization surveillance program. It is not intended to diagnose MRSA infection nor to guide or monitor treatment for MRSA infections. Performed at [facility], [place] [address], [place], [place] [address]     Radiology Studies: Ct Chest Wo Contrast  Result Date: 03/24/2019 CLINICAL DATA:  Cough, chest pain, shortness of breath EXAM: CT CHEST WITHOUT CONTRAST TECHNIQUE: Multidetector CT imaging of the chest was performed following the standard protocol without IV contrast. COMPARISON:  Chest x-ray earlier today FINDINGS: Cardiovascular: Scattered aortic and coronary artery calcifications. Heart is normal size. Aorta is normal caliber. Mediastinum/Nodes: No mediastinal, hilar, or axillary adenopathy. Lungs/Pleura: Patchy airspace opacities in the upper lobes, right greater than left, most pronounced peripherally. Some associated tree-in-bud nodular densities in both upper lobes. Areas of consolidation noted in the right lower lobe/lung base. Probable left base atelectasis. Small right pleural effusion. Upper Abdomen: Imaging into the upper abdomen shows no acute findings. Musculoskeletal: Chest wall soft tissues are unremarkable. No acute bony abnormality. IMPRESSION: Patchy bilateral airspace opacities with tree-in-bud nodular densities in the upper lobes bilaterally, right greater than left. More  confluent consolidation in the right lower lobe. Findings concerning for pneumonia. 5-6 mm nodule in the left lower lobe along the fissure. Non-contrast chest CT at 6-12 months is recommended. If the nodule is stable at time of repeat CT, then future CT at 18-24 months (from today's scan) is considered optional for low-risk patients, but is recommended for high-risk patients. This recommendation follows the consensus statement: Guidelines for Management of Incidental Pulmonary Nodules Detected on CT Images: From the Fleischner Society 2017; Radiology 2017; 284:228-243. Small right pleural effusion. Coronary artery disease. Aortic Atherosclerosis (ICD10-I70.0). Electronically Signed   By: Rolm Baptise M.D.   On: 03/24/2019 19:51   Nm Pulmonary Vent And Perf (v/q Scan)  Result Date: 03/25/2019 CLINICAL DATA:  Elevated D-dimer, progressive shortness of breath, cough, mild fever, some RIGHT-side chest pain, question pulmonary embolism EXAM: NUCLEAR MEDICINE VENTILATION - PERFUSION LUNG SCAN TECHNIQUE: Ventilation images were obtained in multiple projections using inhaled aerosol Tc-39m DTPA. Perfusion images were obtained in multiple projections after intravenous injection of Tc-80m MAA. RADIOPHARMACEUTICALS:  29.5 mCi of Tc-48m DTPA aerosol inhalation and 1.57 mCi Tc25m MAA IV COMPARISON:  Chest radiograph 03/24/2019, CT chest 03/24/2019 FINDINGS: Ventilation: Focal ventilatory defects at the base of the RIGHT lower lobe and at the lateral aspect of the lingula. Diminished ventilation at LEFT apex. Mild elevation of RIGHT diaphragm. Perfusion: Matching diminished perfusion in RIGHT lower lobe and LEFT apex. Mild elevation of RIGHT diaphragm. Larger perfusion defect than ventilation defect at lateral aspect of lingula. Chest CT: RIGHT upper lobe infiltrate. Subsegmental atelectasis BILATERAL lower lobes greater on RIGHT. Minimal subsegmental atelectasis in lingula. IMPRESSION: Triple matched abnormal ventilation,  perfusion and radiographic abnormalities in the RIGHT lower lobe. Slightly larger perfusion defect and ventilation defect at the lateral aspect of the lingula, with only minimal subsegmental atelectasis in the lingula on prior CT. Findings represent an  intermediate probability for pulmonary embolism. Electronically Signed   By: Lavonia Dana M.D.   On: 03/25/2019 16:22   Dg Chest Portable 1 View  Result Date: 03/24/2019 CLINICAL DATA:  Cough and right chest pain since yesterday. EXAM: PORTABLE CHEST 1 VIEW COMPARISON:  PA and lateral chest 09/05/2016. FINDINGS: Lung volumes are low but the lungs are clear. Elevation the right hemidiaphragm relative to the left noted. Heart size is upper normal. No pneumothorax or pleural fluid. IMPRESSION: No acute disease. Electronically Signed   By: Inge Rise M.D.   On: 03/24/2019 16:39   Ct Renal Stone Study  Result Date: 03/24/2019 CLINICAL DATA:  Cough, right side pain. EXAM: CT ABDOMEN AND PELVIS WITHOUT CONTRAST TECHNIQUE: Multidetector CT imaging of the abdomen and pelvis was performed following the standard protocol without IV contrast. COMPARISON:  03/02/2019 FINDINGS: Lower chest: Airspace opacities in the right lower lobe concerning for pneumonia. Left base atelectasis. Trace right effusion. Hepatobiliary: No focal hepatic abnormality. Gallbladder unremarkable. Pancreas: No focal abnormality or ductal dilatation. Spleen: No focal abnormality.  Normal size. Adrenals/Urinary Tract: Multiple left renal stones are noted. Elongated stone is seen within the proximal left ureter with a cross-sectional diameter of 4 mm, extending over approximately 2 cm length. There is mild left hydronephrosis. Small nonobstructing stone in the lower pole of the right kidney. No hydronephrosis on the right. Urinary bladder and adrenal glands unremarkable. Stomach/Bowel: Sigmoid diverticulosis. No active diverticulitis. Normal appendix. Stomach and small bowel decompressed,  unremarkable. Vascular/Lymphatic: Aortic atherosclerosis. No enlarged abdominal or pelvic lymph nodes. Reproductive: No visible focal abnormality. Other: No free fluid or free air. Musculoskeletal: No acute bony abnormality. IMPRESSION: Airspace consolidation in the right lower lobe with trace right effusion. Findings concerning for pneumonia. Bilateral nephrolithiasis, left greater than right. 20 x 4 mm elongated stone or stones seen within the proximal left ureter with mild left hydronephrosis. Aortic atherosclerosis. Electronically Signed   By: Rolm Baptise M.D.   On: 03/24/2019 19:56   Vas [place] Lower Extremity Venous (dvt)  Result Date: 03/26/2019  Lower Venous Study Indications: Edema.  Risk Factors: None identified. Comparison Study: No prior studies. Performing Technologist: Oliver Hum RVT  Examination Guidelines: A complete evaluation includes B-mode imaging, spectral Doppler, color Doppler, and power Doppler as needed of all accessible portions of each vessel. Bilateral testing is considered an integral part of a complete examination. Limited examinations for reoccurring indications may be performed as noted.  +---------+---------------+---------+-----------+----------+--------------+ RIGHT    CompressibilityPhasicitySpontaneityPropertiesThrombus Aging +---------+---------------+---------+-----------+----------+--------------+ CFV      Full           Yes      Yes                                 +---------+---------------+---------+-----------+----------+--------------+ SFJ      Full                                                        +---------+---------------+---------+-----------+----------+--------------+ FV Prox  Full                                                        +---------+---------------+---------+-----------+----------+--------------+  FV Mid   Full                                                         +---------+---------------+---------+-----------+----------+--------------+ FV DistalFull                                                        +---------+---------------+---------+-----------+----------+--------------+ PFV      Full                                                        +---------+---------------+---------+-----------+----------+--------------+ POP      Full           Yes      Yes                                 +---------+---------------+---------+-----------+----------+--------------+ PTV      Partial                                      Acute          +---------+---------------+---------+-----------+----------+--------------+ PERO     Full                                                        +---------+---------------+---------+-----------+----------+--------------+   +---------+---------------+---------+-----------+----------+--------------+ LEFT     CompressibilityPhasicitySpontaneityPropertiesThrombus Aging +---------+---------------+---------+-----------+----------+--------------+ CFV      Full           Yes      Yes                                 +---------+---------------+---------+-----------+----------+--------------+ SFJ      Full                                                        +---------+---------------+---------+-----------+----------+--------------+ FV Prox  Full                                                        +---------+---------------+---------+-----------+----------+--------------+ FV Mid   Full                                                        +---------+---------------+---------+-----------+----------+--------------+  FV DistalFull                                                        +---------+---------------+---------+-----------+----------+--------------+ PFV      Full                                                         +---------+---------------+---------+-----------+----------+--------------+ POP      Partial        Yes      Yes                  Acute          +---------+---------------+---------+-----------+----------+--------------+ PTV      None                                                        +---------+---------------+---------+-----------+----------+--------------+ PERO     None                                                        +---------+---------------+---------+-----------+----------+--------------+     Summary: Right: Findings consistent with acute deep vein thrombosis involving the right posterior tibial veins. No cystic structure found in the popliteal fossa. Left: Findings consistent with acute deep vein thrombosis involving the left popliteal vein. No cystic structure found in the popliteal fossa.  *See table(s) above for measurements and observations.    Preliminary    Scheduled Meds: . allopurinol  300 mg Oral Daily  . aspirin EC  81 mg Oral q morning - 10a  . atorvastatin  20 mg Oral Daily  . cloNIDine  0.1 mg Oral BID  . glimepiride  2 mg Oral Q breakfast  . hydrALAZINE  25 mg Oral BID  . insulin aspart  0-5 Units Subcutaneous QHS  . insulin aspart  0-9 Units Subcutaneous TID WC  . linaclotide  290 mcg Oral QAC breakfast  . metoprolol tartrate  100 mg Oral BID  . tamsulosin  0.4 mg Oral QPC supper   Continuous Infusions: . sodium chloride 100 mL/hr at 03/26/19 0600  . ceFEPime (MAXIPIME) IV Stopped (03/25/19 2239)  . heparin 1,450 Units/hr (03/26/19 4098)    LOS: 2 days   Kerney Elbe, DO [facility] PAGER is on [place]  If 7PM-7AM, please contact night-coverage [email or web address] Password [email or web address] 03/26/2019, 2:01 PM

## 2019-03-26 NOTE — Progress Notes (Addendum)
ANTICOAGULATION CONSULT NOTE - Initial Consult  Pharmacy Consult for IV Heparin Indication: PE/DVT  Allergies  Allergen Reactions  . Ace Inhibitors Other (See Comments)    Severe AKI due to ARB + dehydration Aug 2016  . Angiotensin Receptor Blockers Other (See Comments)    Severe AKI due to ARB + dehydration Aug 2016    Patient Measurements: Height: 5\' 8"  (172.7 cm) Weight: 217 lb (98.4 kg) IBW/kg (Calculated) : 68.4 Heparin Dosing Weight: 89.4 kg  Vital Signs: Temp: 99.2 F (37.3 C) (09/23 0423) Temp Source: Oral (09/23 0423) BP: 151/88 (09/23 0423) Pulse Rate: 82 (09/23 0423)  Labs: Recent Labs    03/24/19 1607 03/24/19 1752 03/25/19 0048 03/26/19 0428  HGB 12.3*  --   --  10.3*  HCT 40.8  --   --  34.8*  PLT 256  --   --  239  CREATININE 3.15*  --  2.96* 2.58*  TROPONINIHS 26* 21*  --   --     Estimated Creatinine Clearance: 29.4 mL/min (A) (by C-G formula based on SCr of 2.58 mg/dL (H)).   Medical History: Past Medical History:  Diagnosis Date  . CHF (congestive heart failure) ([place])   . Chronic diastolic heart failure ([place])   . Constipation   . Diabetes mellitus   . Diverticulosis   . ED (erectile dysfunction)   . Gout   . Heart murmur   . Hyperlipidemia   . Hypertension   . IBS (irritable bowel syndrome)   . Renal calculus   . Renal insufficiency   . Tubular adenoma of colon     Assessment: 84 y/oM admitted on 03/24/2019 for right lower lobe pneumonia. D-dimer on admission elevated at 11.94. Unable to obtain CTPA due to CKD3. VQ scan with intermediate probability for PE. Pharmacy consulted for IV heparin dosing. Lower extremity dopplers also + for bilateral DVT. Patient not on any anticoagulants PTA. He has been on SQ heparin 5000 units SQ q8h for VTE prophylaxis, last dose administered this morning at 0508. CBC: Hgb 10.3, Pltc WNL.   Goal of Therapy:  Heparin level 0.3-0.7 units/ml Monitor platelets by anticoagulation protocol: Yes   Plan:    Check baseline PT/INR, aPTT  Discontinue SQ heparin  Heparin 2000 unit bolus (smaller bolus due to recent SQ heparin dose), then start heparin infusion at 1450 units/hr  Heparin level 8 hours after initiation  Daily CBC, heparin level  Monitor closely for s/sx of bleeding   Lindell Spar, PharmD, BCPS Clinical Pharmacist  03/26/2019,8:07 AM     Addendum:  First heparin level = 0.64 units/mL, therapeutic  No bleeding or infusion issues noted per nursing   Plan:  Continue heparin infusion at 1450 units/hr  Check heparin level in 8 hours to confirm remains within therapeutic range  Daily CBC, heparin level   Lindell Spar, PharmD, BCPS Clinical Pharmacist  03/26/2019 6:27 PM

## 2019-03-27 ENCOUNTER — Ambulatory Visit: Payer: Medicare Other | Admitting: Endocrinology

## 2019-03-27 ENCOUNTER — Inpatient Hospital Stay (HOSPITAL_COMMUNITY): Payer: Medicare Other

## 2019-03-27 DIAGNOSIS — E872 Acidosis: Secondary | ICD-10-CM

## 2019-03-27 LAB — HEPARIN LEVEL (UNFRACTIONATED)
Heparin Unfractionated: 0.65 IU/mL (ref 0.30–0.70)
Heparin Unfractionated: 0.64 IU/mL (ref 0.30–0.70)

## 2019-03-27 LAB — COMPREHENSIVE METABOLIC PANEL
ALT: 14 U/L (ref 0–44)
AST: 12 U/L — ABNORMAL LOW (ref 15–41)
Albumin: 2.5 g/dL — ABNORMAL LOW (ref 3.5–5.0)
Alkaline Phosphatase: 49 U/L (ref 38–126)
Anion gap: 9 (ref 5–15)
BUN: 29 mg/dL — ABNORMAL HIGH (ref 8–23)
CO2: 16 mmol/L — ABNORMAL LOW (ref 22–32)
Calcium: 8.7 mg/dL — ABNORMAL LOW (ref 8.9–10.3)
Chloride: 115 mmol/L — ABNORMAL HIGH (ref 98–111)
Creatinine, Ser: 2.57 mg/dL — ABNORMAL HIGH (ref 0.61–1.24)
GFR calc Af Amer: 28 mL/min — ABNORMAL LOW (ref >60)
GFR calc non Af Amer: 24 mL/min — ABNORMAL LOW (ref >60)
Glucose, Bld: 103 mg/dL — ABNORMAL HIGH (ref 70–99)
Potassium: 4.8 mmol/L (ref 3.5–5.1)
Sodium: 140 mmol/L (ref 135–145)
Total Bilirubin: 0.7 mg/dL (ref 0.3–1.2)
Total Protein: 5.8 g/dL — ABNORMAL LOW (ref 6.5–8.1)

## 2019-03-27 LAB — CBC WITH DIFFERENTIAL/PLATELET
Abs Immature Granulocytes: 0.04 10*3/uL (ref 0.00–0.07)
Basophils Absolute: 0 10*3/uL (ref 0.0–0.1)
Basophils Relative: 0 %
Eosinophils Absolute: 0.2 10*3/uL (ref 0.0–0.5)
Eosinophils Relative: 3 %
HCT: 31.6 % — ABNORMAL LOW (ref 39.0–52.0)
Hemoglobin: 9.8 g/dL — ABNORMAL LOW (ref 13.0–17.0)
Immature Granulocytes: 1 %
Lymphocytes Relative: 13 %
Lymphs Abs: 0.9 10*3/uL (ref 0.7–4.0)
MCH: 27.1 pg (ref 26.0–34.0)
MCHC: 31 g/dL (ref 30.0–36.0)
MCV: 87.5 fL (ref 80.0–100.0)
Monocytes Absolute: 0.7 10*3/uL (ref 0.1–1.0)
Monocytes Relative: 10 %
Neutro Abs: 5.2 10*3/uL (ref 1.7–7.7)
Neutrophils Relative %: 73 %
Platelets: 228 10*3/uL (ref 150–400)
RBC: 3.61 MIL/uL — ABNORMAL LOW (ref 4.22–5.81)
RDW: 13.2 % (ref 11.5–15.5)
WBC: 7.1 10*3/uL (ref 4.0–10.5)
nRBC: 0 % (ref 0.0–0.2)

## 2019-03-27 LAB — PHOSPHORUS: Phosphorus: 2.9 mg/dL (ref 2.5–4.6)

## 2019-03-27 LAB — GLUCOSE, CAPILLARY
Glucose-Capillary: 103 mg/dL — ABNORMAL HIGH (ref 70–99)
Glucose-Capillary: 180 mg/dL — ABNORMAL HIGH (ref 70–99)
Glucose-Capillary: 165 mg/dL — ABNORMAL HIGH (ref 70–99)
Glucose-Capillary: 121 mg/dL — ABNORMAL HIGH (ref 70–99)

## 2019-03-27 LAB — MAGNESIUM: Magnesium: 1.7 mg/dL (ref 1.7–2.4)

## 2019-03-27 MED ORDER — SODIUM BICARBONATE-DEXTROSE 150-5 MEQ/L-% IV SOLN
150.0000 meq | INTRAVENOUS | Status: AC
  Administered 2019-03-27: 150 meq via INTRAVENOUS
  Filled 2019-03-27: qty 1000

## 2019-03-27 MED ORDER — MAGNESIUM SULFATE IN D5W 1-5 GM/100ML-% IV SOLN
1.0000 g | Freq: Once | INTRAVENOUS | Status: AC
  Administered 2019-03-27: 1 g via INTRAVENOUS
  Filled 2019-03-27: qty 100

## 2019-03-27 NOTE — Progress Notes (Signed)
[place] for IV Heparin Indication: PE/DVT  Allergies  Allergen Reactions  . Ace Inhibitors Other (See Comments)    Severe AKI due to ARB + dehydration Aug 2016  . Angiotensin Receptor Blockers Other (See Comments)    Severe AKI due to ARB + dehydration Aug 2016    Patient Measurements: Height: 5\' 8"  (172.7 cm) Weight: 217 lb (98.4 kg) IBW/kg (Calculated) : 68.4 Heparin Dosing Weight: 89.4 kg  Vital Signs: Temp: 99.7 F (37.6 C) (09/24 0507) Temp Source: Oral (09/24 0507) BP: 174/77 (09/24 0507) Pulse Rate: 67 (09/24 0507)  Labs: Recent Labs    03/24/19 1607 03/24/19 1752 03/25/19 0048 03/26/19 0428 03/26/19 0824 03/26/19 1515 03/26/19 1707 03/27/19 0040 03/27/19 0906  HGB 12.3*  --   --  10.3*  --   --   --  9.8*  --   HCT 40.8  --   --  34.8*  --   --   --  31.6*  --   PLT 256  --   --  239  --   --   --  228  --   APTT  --   --   --   --  34  --   --   --   --   LABPROT  --   --   --   --  14.9  --   --   --   --   INR  --   --   --   --  1.2  --   --   --   --   HEPARINUNFRC  --   --   --   --   --   --  0.64 0.65 0.64  CREATININE 3.15*  --  2.96* 2.58*  --   --   --  2.57*  --   TROPONINIHS 26* 21*  --   --   --  31* 34*  --   --     Estimated Creatinine Clearance: 29.5 mL/min (A) (by C-G formula based on SCr of 2.57 mg/dL (H)).   Medical History: Past Medical History:  Diagnosis Date  . CHF (congestive heart failure) ([place])   . Chronic diastolic heart failure ([place])   . Constipation   . Diabetes mellitus   . Diverticulosis   . ED (erectile dysfunction)   . Gout   . Heart murmur   . Hyperlipidemia   . Hypertension   . IBS (irritable bowel syndrome)   . Renal calculus   . Renal insufficiency   . Tubular adenoma of colon     Assessment: 22 y/oM admitted on 03/24/2019 for right lower lobe pneumonia. D-dimer on admission elevated at 11.94. Unable to obtain CTPA due to CKD3. VQ scan with intermediate  probability for PE. Pharmacy consulted for IV heparin dosing. Lower extremity dopplers also + for bilateral DVT. Patient not on any anticoagulants PTA. He was on SQ heparin for VTE prophylaxis during this admission, which has been discontinued.   Today, 03/27/19:  0906 Heparin level = 0.64 units/mL, remains therapeutic  CBC: Hgb decreased to 9.8, Pltc WNL  No bleeding or infusion issues noted per nursing  Goal of Therapy:  Heparin level 0.3-0.7 units/ml Monitor platelets by anticoagulation protocol: Yes   Plan:   Continue heparin infusion at 1450 units/hr  Daily CBC, heparin level  Monitor closely for s/sx of bleeding  F/u for transition to PO anticoagulation    Lindell Spar, PharmD, BCPS  Clinical Pharmacist  03/27/2019,9:42 AM

## 2019-03-27 NOTE — Progress Notes (Signed)
ANTICOAGULATION CONSULT NOTE - Follow Up Consult  Pharmacy Consult for Heparin Indication: pulmonary embolus and DVT  Allergies  Allergen Reactions  . Ace Inhibitors Other (See Comments)    Severe AKI due to ARB + dehydration Aug 2016  . Angiotensin Receptor Blockers Other (See Comments)    Severe AKI due to ARB + dehydration Aug 2016    Patient Measurements: Height: 5\' 8"  (172.7 cm) Weight: 217 lb (98.4 kg) IBW/kg (Calculated) : 68.4 Heparin Dosing Weight:   Vital Signs: Temp: 99.7 F (37.6 C) (09/24 0507) Temp Source: Oral (09/24 0507) BP: 174/77 (09/24 0507) Pulse Rate: 67 (09/24 0507)  Labs: Recent Labs    03/24/19 1607 03/24/19 1752 03/25/19 0048 03/26/19 0428 03/26/19 0824 03/26/19 1515 03/26/19 1707 03/27/19 0040  HGB 12.3*  --   --  10.3*  --   --   --  9.8*  HCT 40.8  --   --  34.8*  --   --   --  31.6*  PLT 256  --   --  239  --   --   --  228  APTT  --   --   --   --  34  --   --   --   LABPROT  --   --   --   --  14.9  --   --   --   INR  --   --   --   --  1.2  --   --   --   HEPARINUNFRC  --   --   --   --   --   --  0.64 0.65  CREATININE 3.15*  --  2.96* 2.58*  --   --   --  2.57*  TROPONINIHS 26* 21*  --   --   --  31* 34*  --     Estimated Creatinine Clearance: 29.5 mL/min (A) (by C-G formula based on SCr of 2.57 mg/dL (H)).   Medications:  Infusions:  . ceFEPime (MAXIPIME) IV 2 g (03/26/19 2119)  . heparin 1,450 Units/hr (03/27/19 0149)    Assessment: Patient with heparin level at goal. No heparin issues noted.  Goal of Therapy:  Heparin level 0.3-0.7 units/ml Monitor platelets by anticoagulation protocol: Yes   Plan:  Continue heparin drip at current rate Recheck level at 0900  Tyler Deis, Shea Stakes [place] 03/27/2019,5:41 AM

## 2019-03-27 NOTE — Progress Notes (Signed)
Physical Therapy Treatment Patient Details Name: Noah Barnes MRN: [id number] DOB: 29-Apr-1946 Today's Date: 03/27/2019    History of Present Illness 73 yo male admitted to ED on 9/21 with RLL PNA. Pt with recent history of L lithotripsy on 9/17. PMH includes CHF, DM, diverticulosis, heart murmur, HLD, HTN, IBS, nephrolithiasis, BLE edema, tubular adenoma of colon.    PT Comments    Pt OOB in recliner one 1 lt nasal at 96%.  Pt reports he is "feeling better".  "That pain in my right chest is gone but I also took pain meds this morning".  Assisted with amb a great distance in hallway while monitoring O2 sats.   SATURATION QUALIFICATIONS: (This note is used to comply with regulatory documentation for home oxygen)  Patient Saturations on Room Air at Rest = 92%  Patient Saturations on Room Air while Ambulating 155 feet = 88%   HR 114 no dyspnea  Please briefly explain why patient needs home oxygen:  Pt did NOT require supplemental Oxygen during activity.     Follow Up Recommendations  No PT follow up     Equipment Recommendations  None recommended by PT    Recommendations for Other Services       Precautions / Restrictions Precautions Precautions: Fall Precaution Comments: monitor O2 sats Restrictions Weight Bearing Restrictions: No    Mobility  Bed Mobility               General bed mobility comments: OOB in recliner  Transfers Overall transfer level: Needs assistance Equipment used: Rolling walker (2 wheeled) Transfers: Sit to/from Stand Sit to Stand: Supervision         General transfer comment: supervision for safety, increased time to rise.  Ambulation/Gait Ambulation/Gait assistance: Supervision Gait Distance (Feet): 275 Feet Assistive device: Rolling walker (2 wheeled) Gait Pattern/deviations: Step-through pattern Gait velocity: WFL   General Gait Details: used a walker only for pt safety as therapist had IV pole and dynamate/O2 tank.   Monitoring sats and HR.  Pt tolerated an increased distance and did not require supplemental oxygen.   Stairs             Wheelchair Mobility    Modified Rankin (Stroke Patients Only)       Balance                                            Cognition Arousal/Alertness: Awake/alert Behavior During Therapy: WFL for tasks assessed/performed Overall Cognitive Status: Within Functional Limits for tasks assessed                                 General Comments: very pleasant and aware of his current medical situation      Exercises      General Comments        Pertinent Vitals/Pain Pain Assessment: No/denies pain    Home Living                      Prior Function            PT Goals (current goals can now be found in the care plan section) Progress towards PT goals: Progressing toward goals    Frequency    Min 3X/week      PT Plan Current plan remains appropriate  Co-evaluation              AM-PAC PT "6 Clicks" Mobility   Outcome Measure  Help needed turning from your back to your side while in a flat bed without using bedrails?: None Help needed moving from lying on your back to sitting on the side of a flat bed without using bedrails?: None Help needed moving to and from a bed to a chair (including a wheelchair)?: None Help needed standing up from a chair using your arms (e.g., wheelchair or bedside chair)?: None Help needed to walk in hospital room?: None Help needed climbing 3-5 steps with a railing? : A Little 6 Click Score: 23    End of Session Equipment Utilized During Treatment: Gait belt Activity Tolerance: Patient tolerated treatment well Patient left: in chair;with call bell/phone within reach   PT Visit Diagnosis: Other abnormalities of gait and mobility (R26.89)     Time: 3976-7341 PT Time Calculation (min) (ACUTE ONLY): 25 min  Charges:  $Gait Training: 8-22 mins $Therapeutic  Activity: 8-22 mins                     Rica Koyanagi  PTA Acute  Rehabilitation Services Pager      [phone] Office      [phone]

## 2019-03-27 NOTE — Progress Notes (Signed)
PROGRESS NOTE    Noah Barnes  BWI:[id number] DOB: 11-29-1945 DOA: 03/24/2019 PCP: Binnie Rail, MD   Brief Narrative:  Noah Barnes a 73 y.o.malewith medical history significant ofnephrolithiasis status post lithotripsy only last week, diastolic dysfunction CHF, diabetes, hypertension, gout, irritable bowel syndrome, diverticular disease and erectile dysfunction who came to the ER with progressive shortness of breath over the last couple of days. He had his lithotripsy about a week ago. He was in short stay and patient had anesthesia. Patient returned today with shortness of breath and cough. Associated with some low-grade temperature. He denied any significant sputum. He also has some right-sided chest pain which was dull ache with occasional sharp pain in the area. Highest was 6 out of 10. Patient seen and evaluated in the ER. Appears to have right lower lobe pneumonia on CT. He has acute on chronic kidney disease so unable to get CT angiogram to rule out PE. Patient is being admitted at this point with diagnoses of healthcare associated pneumonia possibly aspiration from his procedure last week..  ED Course:Temperature is 98.6 blood pressure 187/101 pulse 87 respiratory 30 oxygen sat 81% on room air currently 99% on 2 L. White count is 8.0 hemoglobin 12.3 and platelets 256. Sodium 139 potassium 5.1 chloride 109 CO2 of 19 with BUN 39 creatinine 3.15 his baseline is 1.7. Calcium 9.7.His albumin is 2.7. Troponin XX 1 and 26 BNP 204 d-dimer is 19. Chest x-ray showed no acute findings but CT chest without contrast showed patchy bilateral airspace opacities with tree-in-bud nodular density in the upper lobes bilaterally right greater than left more confluent consolidation in the right lower lobe. Findings concerning for an pneumonia. He has some nodule 5.6 mm in the left lower lobe. CT abdomen renal stone study showed the airspace consolidation in the right lower lobe with  bilateral nephrolithiasis left greater than right. He has 20 x 4 mm stone seen within the proximal left ureter with mild left hydronephrosis.  **Interim History  Patient's VQ scan was read as intermediate probability for PE on examination today he did have some swelling bilaterally.  Lower extremity venous duplex was done and did show bilateral DVTs.  Patient was started on a heparin drip and renal function is trending down with IV hydration however will reduce fluid rate to 50 mL's per hour for 10 more hours given his history of diastolic CHF.  Because the patient has an acidosis he is started on a bicarbonate drip 75 mL's per hour for 10 hours today.  Assessment & Plan:   Principal Problem:   HCAP (healthcare-associated pneumonia) Active Problems:   Diabetes mellitus with renal complications ([place])   HYPERLIPIDEMIA   Essential hypertension   IBS (irritable bowel syndrome)   Acute on chronic kidney failure (HCC)   Chronic diastolic heart failure (HCC)  Right lower lobe pneumonia with acute respiratory failure with hypoxia, improving -Patient presenting to the ED with progressive shortness of breath and cough.  Recently underwent lithotripsy with anesthesia 1 week ago.   -Patient was noted to be hypoxic on room air oxygenating 81%.   -Patient was afebrile without leukocytosis but spiked a temperature of 100.4 few days ago and could be secondary to the PE.  BNP 204, D-dimer elevated at 11.94. COVID-19 negative. 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  -CT chest w/o contrast Concerning for right lower lobe consolidation.  Strep pneumo urinary antigen negative.   -Patient was started on vancomycin, Levaquin, and cefepime.  -Initially thought to  have Low probability PE with no tachycardia and review of EKG, no S in lead I, no Q wave in III, but twave inversion in lead III but similar in comparison to EKG dated 08/28/2018 on review but now given DVT's and V/Q Scan suspect PE -Discontinued Levaquin and  Discontinued IV Vancomycin given Negative PCR -C/w IV Cefepime for now and transition to p.o. antibiotics at discharge and today is day 4 of antibiotics -Currently on 2.5 L nasal cannula, continue to titrate supplemental oxygen to maintain SpO2 >92%; when I walked in he was titrated down and will wean to room air -Will Need home Ambulatory screen prior to discharge and yesterday he desaturated down to 78% on room air; today he did not desaturate down past 88% we will repeat O2 screen again in the a.m. prior to discharge hopefully  Elevated D-Dimer with Suspected PE in the setting of Bilateral DVT's -D-Dimer was elevated on admission at 11.94 and repeat was 11.14 -V/Q showed "Triple matched abnormal ventilation, perfusion and radiographic abnormalities in the RIGHT lower lobe.  Slightly larger perfusion defect and ventilation defect at the lateral aspect of the lingula, with only minimal subsegmental atelectasis in the lingula on prior CT. Findings represent an intermediate probability for pulmonary embolism." -LE Duplex Preliminary Results show an acute right DVT involving the right posterior tibial veins with no cystic structure follow popliteal fossa and there is also an acute left-sided DVT involving the left popliteal vein with no cystic structure found in the popliteal fossa -Checked ECHOCardiogram to evaluate Right Heart Strain there is none -Anticoagulate with Heparin gtt for now and then transition to oral NOAC and likely to be Eliquis based on kidney function results and likely will transition tomorrow -Discussed with Dr. Maylon Peppers of medical oncology for further outpatient evaluation for hypercoagulable work-up -Continue supplemental oxygen via nasal cannula and wean O2 as tolerated -We will need an ambulatory home oxygen to screen prior to discharge again  Acute on Chronic RCB6L Metabolic Acidosis  Nephrolithiasis -Baseline creatinine 1.4-1.6 with GFR 49.  Recently underwent lithotripsy by  Dr. Alyson Ingles. CT renal stone study on admission with Bilateral nephrolithiasis, left greater than right with mild left hydronephrosis -Patient's BUN/Cr is trending down and went from 39/3.15 and is now 29/2.57 -Avoid nephrotoxins, renally dose all medications and avoid contrast dyes as well as hypotension if possible -Patient CO2 was 16, anion gap was 9, chloride levels 115 -IV fluids and rate was decreased down to 50 mL's yesterday per hour given that he is also found to be started on a heparin drip and has diastolic CHF yesterday and had stopped; -Restarted IV fluids at 75 mL's per hour with sodium bicarbonate drip with 150 mEq given his metabolic acidosis -Pain control as below -Continue with tamsulosin 0.4 mg p.o. daily -Continue monitor and trend renal function daily and repeat CMP in a.m. -Patient may need further nephrological evaluation as an outpatient and will make referral -We will discuss with Urology to see if further evaluation is warranted and the urology resident recommends obtaining a renal ultrasound and repeating creatinine; patient may need urological stenting given that he has 24 mm stones that could be likely causing obstructive uropathy.  Pulmonary nodule, LLL -Incidental finding on CT chest without contrast, 5-6 mm nodule  Left lower lobe.   -Recommend interval CT scan in 6-12 months.  T2DM -On Glimepiride 2 mg PO daily and Janumet 50-1000 PO BID at home. -Hemoglobin A1c 6.3 on 03/06/2019, well controlled. -Hold oral hypoglycemics while inpatient  and I stopped his Glimperide as he was still on it this AM  -C/w Sensitive Novolog SSI AC/HS -CBGs ranging from 103-180  HLD -Continue Atorvastatin 20 mg po Daily   Chronic Diastolic congestive heart failure, compensated currently  Essential Hypertension  BP 122/69 this morning. -Continue Clonidine 0.1 mg BID, metoprolol tartrate 100 mg BID, Hydralazine 25mg  BID -no ACEi secondary to renal function -Strict I's and  O's and daily weights and continue to monitor her volume status carefully We will repeat echocardiogram to evaluate right heart strain given newly diagnosed DVTs -On aspirin and statin  Chronic Pain Syndrome -Continue Tramadol 50-100 mg po q12hprn Moderate -C/w Oxycodone-Acetaminophen 1 tab po q4hprn Moderate and Severe Pain   Normocephalic Anemia -Patient's hemoglobin/hematocrit went from 12.3/40.8 is now 9.8/31.6 -Check Anemia Panel in the a.m. -Continue to monitor for signs and symptoms of bleeding now that he is anticoagulated with a heparin drip; currently no overt bleeding noted -Repeat CBC in a.m.  Obesity -Estimated body mass index is 32.99 kg/m as calculated from the following:   Height as of this encounter: 5\' 8"  (1.727 m).   Weight as of this encounter: 98.4 kg. -Weight Loss and Dietary Counseling given   DVT prophylaxis: Anticoagulated with Heparin gtt Code Status: FULL CODE  Family Communication: No family present at bedside but discussed with  Disposition Plan: Pending further workup and evaluation and anticipate D/C home in the next 24-48 hours if stable  Consultants:   Discussed with Medical Oncology Dr. Maylon Peppers   Procedures:  LE VENOUS DUPLEX PRELIMINARY RESULTS Summary: Right: Findings consistent with acute deep vein thrombosis involving the right posterior tibial veins. No cystic structure found in the popliteal fossa. Left: Findings consistent with acute deep vein thrombosis involving the left popliteal vein. No cystic structure found in the popliteal fossa.  ECHOCARDIOGRAM IMPRESSIONS    1. Left ventricular ejection fraction, by visual estimation, is 60 to 65%. The left ventricle has normal function. Normal left ventricular size. There is mildly increased left ventricular hypertrophy.  2. Left ventricular diastolic Doppler parameters are consistent with impaired relaxation pattern of LV diastolic filling.  3. Global right ventricle has normal systolic  function.The right ventricular size is normal. No increase in right ventricular wall thickness.  4. Left atrial size was normal.  5. Right atrial size was normal.  6. The mitral valve is normal in structure. No evidence of mitral valve regurgitation. No evidence of mitral stenosis.  7. The tricuspid valve is normal in structure. Tricuspid valve regurgitation is mild.  8. The aortic valve is tricuspid Aortic valve regurgitation is trivial by color flow Doppler. Mild aortic valve sclerosis without stenosis.  9. The pulmonic valve was normal in structure. Pulmonic valve regurgitation is trivial by color flow Doppler. 10. Severely elevated pulmonary artery systolic pressure. 11. The inferior vena cava is normal in size with greater than 50% respiratory variability, suggesting right atrial pressure of 3 mmHg.  FINDINGS  Left Ventricle: Left ventricular ejection fraction, by visual estimation, is 60 to 65%. The left ventricle has normal function. There is mildly increased left ventricular hypertrophy. Normal left ventricular size. Spectral Doppler shows Left ventricular  diastolic Doppler parameters are consistent with impaired relaxation pattern of LV diastolic filling.  Right Ventricle: The right ventricular size is normal. No increase in right ventricular wall thickness. Global RV systolic function is has normal systolic function. The tricuspid regurgitant velocity is 4.46 m/s, and with an assumed right atrial pressure  of 10 mmHg, the estimated right  ventricular systolic pressure is severely elevated at 89.6 mmHg.  Left Atrium: Left atrial size was normal in size.  Right Atrium: Right atrial size was normal in size  Pericardium: There is no evidence of pericardial effusion.  Mitral Valve: The mitral valve is normal in structure. No evidence of mitral valve stenosis by observation. No evidence of mitral valve regurgitation.  Tricuspid Valve: The tricuspid valve is normal in structure.  Tricuspid valve regurgitation is mild by color flow Doppler.  Aortic Valve: The aortic valve is tricuspid. Aortic valve regurgitation is trivial by color flow Doppler. Mild aortic valve sclerosis is present, with no evidence of aortic valve stenosis.  Pulmonic Valve: The pulmonic valve was normal in structure. Pulmonic valve regurgitation is trivial by color flow Doppler.  Aorta: The aortic root, ascending aorta and aortic arch are all structurally normal, with no evidence of dilitation or obstruction.  Venous: The inferior vena cava is normal in size with greater than 50% respiratory variability, suggesting right atrial pressure of 3 mmHg.  IAS/Shunts: No atrial level shunt detected by color flow Doppler. No ventricular septal defect is seen or detected. There is no evidence of an atrial septal defect.     LEFT VENTRICLE PLAX 2D LVIDd:         4.30 cm  Diastology LVIDs:         2.90 cm  LV e' lateral:   0.12 cm/s LV PW:         1.30 cm  LV E/e' lateral: 7.0 LV IVS:        1.30 cm  LV e' medial:    0.08 cm/s LVOT diam:     2.00 cm  LV E/e' medial:  11.1 LV SV:         51 ml LV SV Index:   23.05 LVOT Area:     3.14 cm    RIGHT VENTRICLE RV Basal diam:  2.60 cm RV S prime:     12.80 cm/s TAPSE (M-mode): 2.5 cm  LEFT ATRIUM             Index LA diam:        3.50 cm 1.65 cm/m LA Vol (A2C):   53.7 ml 25.38 ml/m LA Vol (A4C):   46.1 ml 21.79 ml/m LA Biplane Vol: 51.8 ml 24.48 ml/m  AORTIC VALVE LVOT Vmax:   99.90 cm/s LVOT Vmean:  71.200 cm/s LVOT VTI:    0.227 m   AORTA Ao Root diam: 3.30 cm  MITRAL VALVE                         TRICUSPID VALVE MV Area (PHT): 3.85 cm              TR Peak grad:   79.6 mmHg MV PHT:        57.13 msec            TR Vmax:        451.00 cm/s MV Decel Time: 197 msec MV E velocity: 0.84 cm/s   103 cm/s  SHUNTS MV A velocity: 109.00 cm/s 70.3 cm/s Systemic VTI:  0.23 m MV E/A ratio:  0.01        1.5       Systemic Diam: 2.00  cm   Antimicrobials:  Anti-infectives (From admission, onward)   Start     Dose/Rate Route Frequency Ordered Stop   03/27/19 1000  vancomycin (VANCOCIN) IVPB 1000 mg/200 mL premix  Status:  Discontinued     1,000 mg 200 mL/hr over 60 Minutes Intravenous Every 24 hours 03/25/19 0214 03/25/19 1613   03/26/19 2200  vancomycin (VANCOCIN) IVPB 1000 mg/200 mL premix  Status:  Discontinued     1,000 mg 200 mL/hr over 60 Minutes Intravenous Every 48 hours 03/25/19 1613 03/26/19 0756   03/25/19 2200  ceFEPIme (MAXIPIME) 2 g in sodium chloride 0.9 % 100 mL IVPB     2 g 200 mL/hr over 30 Minutes Intravenous Every 24 hours 03/25/19 0214     03/25/19 1800  levofloxacin (LEVAQUIN) IVPB 750 mg  Status:  Discontinued     750 mg 100 mL/hr over 90 Minutes Intravenous Every 48 hours 03/24/19 2211 03/25/19 1331   03/24/19 2215  ceFEPIme (MAXIPIME) 2 g in sodium chloride 0.9 % 100 mL IVPB     2 g 200 mL/hr over 30 Minutes Intravenous  Once 03/24/19 2209 03/24/19 2256   03/24/19 2215  vancomycin (VANCOCIN) 2,000 mg in sodium chloride 0.9 % 500 mL IVPB     2,000 mg 250 mL/hr over 120 Minutes Intravenous  Once 03/24/19 2209 03/25/19 0038   03/24/19 2030  cefTRIAXone (ROCEPHIN) 1 g in sodium chloride 0.9 % 100 mL IVPB  Status:  Discontinued     1 g 200 mL/hr over 30 Minutes Intravenous  Once 03/24/19 2018 03/24/19 2209   03/24/19 2030  azithromycin (ZITHROMAX) 500 mg in sodium chloride 0.9 % 250 mL IVPB     500 mg 250 mL/hr over 60 Minutes Intravenous  Once 03/24/19 2018 03/24/19 2221     Subjective: Seen and examined at bedside sitting in chair and felt better today.  Was not as short of breath and thinks his pneumonia is "breaking up".  No chest pain, lightheadedness or dizziness and oxygen requirement is weaning.  No other concerns or plans at this time and has not had any bleeding episodes noted.  Objective: Vitals:   03/26/19 2308 03/27/19 0507 03/27/19 1055 03/27/19 1327  BP: (!) 144/88 (!)  174/77 (!) 168/74 122/69  Pulse: 63 67  62  Resp:  20  16  Temp:  99.7 F (37.6 C)  98.8 F (37.1 C)  TempSrc:  Oral  Oral  SpO2:  93%  96%  Weight:      Height:        Intake/Output Summary (Last 24 hours) at 03/27/2019 1726 Last data filed at 03/27/2019 0600 Gross per 24 hour  Intake 1159.47 ml  Output 425 ml  Net 734.47 ml   Filed Weights   03/25/19 0021 03/26/19 0432  Weight: 96.3 kg 98.4 kg   Examination: Physical Exam:  Constitutional: WN/WD obese AAM in NAD and appears calm Eyes: Lids and conjunctivae normal, sclerae anicteric  ENMT: External Ears, Nose appear normal. Grossly normal hearing. Mucous membranes are moist.  Neck: Appears normal, supple, no cervical masses, normal ROM, no appreciable thyromegaly; no JVD Respiratory: Diminished to auscultation bilaterally, no wheezing, rales, rhonchi or crackles. Normal respiratory effort and patient is not tachypenic. No accessory muscle use.  Cardiovascular: RRR, no murmurs / rubs / gallops. S1 and S2 auscultated. 1+ LE extremity edema bilaterally. 2+ pedal pulses. No carotid bruits.  Abdomen: Soft, non-tender, Distended 2/2 body habitus. No masses palpated. No appreciable hepatosplenomegaly. Bowel sounds positive x4.  GU: Deferred. Musculoskeletal: No clubbing / cyanosis of digits/nails. No joint deformity upper and lower extremities. Skin: No rashes, lesions, ulcers on a limited skin eval. No induration; Warm and dry.  Neurologic: CN 2-12  grossly intact with no focal deficits. Romberg sign cerebellar reflexes not assessed.  Psychiatric: Normal judgment and insight. Alert and oriented x 3. Normal mood and appropriate affect.   Data Reviewed: I have personally reviewed following labs and imaging studies  CBC: Recent Labs  Lab 03/24/19 1607 03/26/19 0428 03/27/19 0040  WBC 8.0 7.8 7.1  NEUTROABS 6.5  --  5.2  HGB 12.3* 10.3* 9.8*  HCT 40.8 34.8* 31.6*  MCV 88.3 89.5 87.5  PLT 256 239 644   Basic Metabolic  Panel: Recent Labs  Lab 03/24/19 1607 03/25/19 0048 03/26/19 0428 03/27/19 0040  NA 139 141 140 140  K 5.1 5.1 5.1 4.8  CL 109 112* 115* 115*  CO2 19* 15* 16* 16*  GLUCOSE 140* 135* 116* 103*  BUN 39* 35* 32* 29*  CREATININE 3.15* 2.96* 2.58* 2.57*  CALCIUM 9.7 9.2 8.7* 8.7*  MG  --   --  1.7 1.7  PHOS  --   --   --  2.9   GFR: Estimated Creatinine Clearance: 29.5 mL/min (A) (by C-G formula based on SCr of 2.57 mg/dL (H)). Liver Function Tests: Recent Labs  Lab 03/24/19 1752 03/25/19 0048 03/27/19 0040  AST 10* 13* 12*  ALT 14 17 14   ALKPHOS 46 59 49  BILITOT 0.8 0.8 0.7  PROT 5.9* 6.9 5.8*  ALBUMIN 2.7* 3.2* 2.5*   No results for input(s): LIPASE, AMYLASE in the last 168 hours. No results for input(s): AMMONIA in the last 168 hours. Coagulation Profile: Recent Labs  Lab 03/26/19 0824  INR 1.2   Cardiac Enzymes: No results for input(s): CKTOTAL, CKMB, CKMBINDEX, TROPONINI in the last 168 hours. BNP (last 3 results) No results for input(s): PROBNP in the last 8760 hours. HbA1C: No results for input(s): HGBA1C in the last 72 hours. CBG: Recent Labs  Lab 03/26/19 1636 03/26/19 2107 03/27/19 0728 03/27/19 1126 03/27/19 1622  GLUCAP 105* 120* 103* 180* 165*   Lipid Profile: No results for input(s): CHOL, HDL, LDLCALC, TRIG, CHOLHDL, LDLDIRECT in the last 72 hours. Thyroid Function Tests: No results for input(s): TSH, T4TOTAL, FREET4, T3FREE, THYROIDAB in the last 72 hours. Anemia Panel: No results for input(s): VITAMINB12, FOLATE, FERRITIN, TIBC, IRON, RETICCTPCT in the last 72 hours. Sepsis Labs: No results for input(s): PROCALCITON, LATICACIDVEN in the last 168 hours.  Recent Results (from the past 240 hour(s))  SARS Coronavirus 2 [facility] order, Performed in [facility] hospital lab) Nasopharyngeal Nasopharyngeal Swab     Status: None   Collection Time: 03/24/19  4:08 PM   Specimen: Nasopharyngeal Swab  Result Value Ref Range Status   SARS  Coronavirus 2 NEGATIVE NEGATIVE Final    Comment: (NOTE) If result is NEGATIVE SARS-CoV-2 target nucleic acids are NOT DETECTED. The SARS-CoV-2 RNA is generally detectable in upper and lower  respiratory specimens during the acute phase of infection. The lowest  concentration of SARS-CoV-2 viral copies this assay can detect is 250  copies / mL. A negative result does not preclude SARS-CoV-2 infection  and should not be used as the sole basis for treatment or other  patient management decisions.  A negative result may occur with  improper specimen collection / handling, submission of specimen other  than nasopharyngeal swab, presence of viral mutation(s) within the  areas targeted by this assay, and inadequate number of viral copies  (<250 copies / mL). A negative result must be combined with clinical  observations, patient history, and epidemiological information. If result is POSITIVE SARS-CoV-2 target  nucleic acids are DETECTED. The SARS-CoV-2 RNA is generally detectable in upper and lower  respiratory specimens dur ing the acute phase of infection.  Positive  results are indicative of active infection with SARS-CoV-2.  Clinical  correlation with patient history and other diagnostic information is  necessary to determine patient infection status.  Positive results do  not rule out bacterial infection or co-infection with other viruses. If result is PRESUMPTIVE POSTIVE SARS-CoV-2 nucleic acids MAY BE PRESENT.   A presumptive positive result was obtained on the submitted specimen  and confirmed on repeat testing.  While 2019 novel coronavirus  (SARS-CoV-2) nucleic acids may be present in the submitted sample  additional confirmatory testing may be necessary for epidemiological  and / or clinical management purposes  to differentiate between  SARS-CoV-2 and other Sarbecovirus currently known to infect humans.  If clinically indicated additional testing with an alternate test   methodology [phone]) is advised. The SARS-CoV-2 RNA is generally  detectable in upper and lower respiratory sp ecimens during the acute  phase of infection. The expected result is Negative. Fact Sheet for Patients:  StrictlyIdeas.no Fact Sheet for Healthcare Providers: BankingDealers.co.za This test is not yet approved or cleared by the [place] FDA and has been authorized for detection and/or diagnosis of SARS-CoV-2 by FDA under an Emergency Use Authorization (EUA).  This EUA will remain in effect (meaning this test can be used) for the duration of the COVID-19 declaration under Section 564(b)(1) of the Act, 21 U.S.C. section 360bbb-3(b)(1), unless the authorization is terminated or revoked sooner. Performed at [facility], [place] [address], [place], [place] [address]   Blood culture (routine x 2)     Status: None (Preliminary result)   Collection Time: 03/24/19  9:15 PM   Specimen: BLOOD  Result Value Ref Range Status   Specimen Description   Final    BLOOD RIGHT ANTECUBITAL Performed at [facility] [address] [place], [place] [address]    Special Requests   Final    BOTTLES DRAWN AEROBIC AND ANAEROBIC Blood Culture adequate volume Performed at [facility] [address], [place], [place] [address]    Culture   Final    NO GROWTH 3 DAYS Performed at [facility], [place] [address], [place], [place] [address]    Report Status PENDING  Incomplete  Blood culture (routine x 2)     Status: None (Preliminary result)   Collection Time: 03/25/19 12:48 AM   Specimen: BLOOD RIGHT HAND  Result Value Ref Range Status   Specimen Description   Final    BLOOD RIGHT HAND Performed at [facility], [place] [address], [place], [place] [address]    Special Requests   Final    BOTTLES DRAWN AEROBIC ONLY Blood Culture adequate volume Performed at [facility] [address], [place], [place] [address]    Culture   Final    NO GROWTH 2 DAYS Performed at [facility] [address], [place],  [address]    Report Status PENDING  Incomplete  MRSA PCR Screening     Status: None   Collection Time: 03/25/19  1:31 PM   Specimen: Nasal Mucosa; Nasopharyngeal  Result Value Ref Range Status   MRSA by PCR NEGATIVE NEGATIVE Final    Comment:        The GeneXpert MRSA Assay (FDA approved for NASAL specimens only), is one component of a comprehensive MRSA  colonization surveillance program. It is not intended to diagnose MRSA infection nor to guide or monitor treatment for MRSA infections. Performed at [facility], [place] [address] [place], [place] [address]     Radiology Studies: Vas [place] Lower Extremity Venous (dvt)  Result Date: 03/26/2019  Lower Venous Study Indications: Edema.  Risk Factors: None identified. Comparison Study: No prior studies. Performing Technologist: Oliver Hum RVT  Examination Guidelines: A complete evaluation includes B-mode imaging, spectral Doppler, color Doppler, and power Doppler as needed of all accessible portions of each vessel. Bilateral testing is considered an integral part of a complete examination. Limited examinations for reoccurring indications may be performed as noted.  +---------+---------------+---------+-----------+----------+--------------+ RIGHT    CompressibilityPhasicitySpontaneityPropertiesThrombus Aging +---------+---------------+---------+-----------+----------+--------------+ CFV      Full           Yes      Yes                                 +---------+---------------+---------+-----------+----------+--------------+ SFJ      Full                                                        +---------+---------------+---------+-----------+----------+--------------+ FV Prox  Full                                                         +---------+---------------+---------+-----------+----------+--------------+ FV Mid   Full                                                        +---------+---------------+---------+-----------+----------+--------------+ FV DistalFull                                                        +---------+---------------+---------+-----------+----------+--------------+ PFV      Full                                                        +---------+---------------+---------+-----------+----------+--------------+ POP      Full           Yes      Yes                                 +---------+---------------+---------+-----------+----------+--------------+ PTV      Partial                                      Acute          +---------+---------------+---------+-----------+----------+--------------+  PERO     Full                                                        +---------+---------------+---------+-----------+----------+--------------+   +---------+---------------+---------+-----------+----------+--------------+ LEFT     CompressibilityPhasicitySpontaneityPropertiesThrombus Aging +---------+---------------+---------+-----------+----------+--------------+ CFV      Full           Yes      Yes                                 +---------+---------------+---------+-----------+----------+--------------+ SFJ      Full                                                        +---------+---------------+---------+-----------+----------+--------------+ FV Prox  Full                                                        +---------+---------------+---------+-----------+----------+--------------+ FV Mid   Full                                                        +---------+---------------+---------+-----------+----------+--------------+ FV DistalFull                                                         +---------+---------------+---------+-----------+----------+--------------+ PFV      Full                                                        +---------+---------------+---------+-----------+----------+--------------+ POP      Partial        Yes      Yes                  Acute          +---------+---------------+---------+-----------+----------+--------------+ PTV      None                                                        +---------+---------------+---------+-----------+----------+--------------+ PERO     None                                                        +---------+---------------+---------+-----------+----------+--------------+  Summary: Right: Findings consistent with acute deep vein thrombosis involving the right posterior tibial veins. No cystic structure found in the popliteal fossa. Left: Findings consistent with acute deep vein thrombosis involving the left popliteal vein. No cystic structure found in the popliteal fossa.  *See table(s) above for measurements and observations. Electronically signed by Harold Barban MD on 03/26/2019 at 2:14:37 PM.    Final    Scheduled Meds: . allopurinol  300 mg Oral Daily  . aspirin EC  81 mg Oral q morning - 10a  . atorvastatin  20 mg Oral Daily  . cloNIDine  0.1 mg Oral BID  . hydrALAZINE  25 mg Oral BID  . insulin aspart  0-5 Units Subcutaneous QHS  . insulin aspart  0-9 Units Subcutaneous TID WC  . linaclotide  290 mcg Oral QAC breakfast  . metoprolol tartrate  100 mg Oral BID  . tamsulosin  0.4 mg Oral QPC supper   Continuous Infusions: . ceFEPime (MAXIPIME) IV 2 g (03/26/19 2119)  . heparin 1,450 Units/hr (03/27/19 0149)  . sodium bicarbonate 150 mEq in dextrose 5% 1000 mL 150 mEq (03/27/19 1057)    LOS: 3 days   Kerney Elbe, DO [facility] PAGER is on [place]  If 7PM-7AM, please contact night-coverage [email or web address] Password [facility] 03/27/2019, 5:26 PM

## 2019-03-27 NOTE — Progress Notes (Signed)
PHYSICAL THERAPY  SATURATION QUALIFICATIONS: (This note is used to comply with regulatory documentation for home oxygen)  Patient Saturations on Room Air at Rest = 92%  Patient Saturations on Room Air while Ambulating 155 feet = 88%   HR 114 no dyspnea   Please briefly explain why patient needs home oxygen:  Pt did NOT require supplemental Oxygen during activity.  Rica Koyanagi  PTA Acute  Rehabilitation Services Pager      [phone] Office      [phone]

## 2019-03-27 NOTE — Care Management Important Message (Signed)
Important Message  Patient Details IM Letter given to Dessa Phi RN to present to the Patient Name: Noah Barnes MRN: [id number] Date of Birth: 1946-06-04   Medicare Important Message Given:  Yes     [name] 03/27/2019, 11:54 AM

## 2019-03-28 DIAGNOSIS — N1330 Unspecified hydronephrosis: Secondary | ICD-10-CM

## 2019-03-28 LAB — COMPREHENSIVE METABOLIC PANEL
ALT: 14 U/L (ref 0–44)
AST: 14 U/L — ABNORMAL LOW (ref 15–41)
Albumin: 2.5 g/dL — ABNORMAL LOW (ref 3.5–5.0)
Alkaline Phosphatase: 50 U/L (ref 38–126)
Anion gap: 10 (ref 5–15)
BUN: 25 mg/dL — ABNORMAL HIGH (ref 8–23)
CO2: 20 mmol/L — ABNORMAL LOW (ref 22–32)
Calcium: 9 mg/dL (ref 8.9–10.3)
Chloride: 108 mmol/L (ref 98–111)
Creatinine, Ser: 2.64 mg/dL — ABNORMAL HIGH (ref 0.61–1.24)
GFR calc Af Amer: 27 mL/min — ABNORMAL LOW (ref >60)
GFR calc non Af Amer: 23 mL/min — ABNORMAL LOW (ref >60)
Glucose, Bld: 158 mg/dL — ABNORMAL HIGH (ref 70–99)
Potassium: 4.2 mmol/L (ref 3.5–5.1)
Sodium: 138 mmol/L (ref 135–145)
Total Bilirubin: 0.6 mg/dL (ref 0.3–1.2)
Total Protein: 5.9 g/dL — ABNORMAL LOW (ref 6.5–8.1)

## 2019-03-28 LAB — CBC
HCT: 33.1 % — ABNORMAL LOW (ref 39.0–52.0)
Hemoglobin: 10.3 g/dL — ABNORMAL LOW (ref 13.0–17.0)
MCH: 27.2 pg (ref 26.0–34.0)
MCHC: 31.1 g/dL (ref 30.0–36.0)
MCV: 87.3 fL (ref 80.0–100.0)
Platelets: 281 10*3/uL (ref 150–400)
RBC: 3.79 MIL/uL — ABNORMAL LOW (ref 4.22–5.81)
RDW: 13.2 % (ref 11.5–15.5)
WBC: 6.8 10*3/uL (ref 4.0–10.5)
nRBC: 0 % (ref 0.0–0.2)

## 2019-03-28 LAB — IRON AND TIBC
Iron: 29 ug/dL — ABNORMAL LOW (ref 45–182)
Saturation Ratios: 13 % — ABNORMAL LOW (ref 17.9–39.5)
TIBC: 216 ug/dL — ABNORMAL LOW (ref 250–450)
UIBC: 187 ug/dL

## 2019-03-28 LAB — VITAMIN B12: Vitamin B-12: 206 pg/mL (ref 180–914)

## 2019-03-28 LAB — HEPARIN LEVEL (UNFRACTIONATED): Heparin Unfractionated: 0.51 IU/mL (ref 0.30–0.70)

## 2019-03-28 LAB — CREATININE, SERUM
Creatinine, Ser: 2.66 mg/dL — ABNORMAL HIGH (ref 0.61–1.24)
GFR calc Af Amer: 27 mL/min — ABNORMAL LOW (ref >60)
GFR calc non Af Amer: 23 mL/min — ABNORMAL LOW (ref >60)

## 2019-03-28 LAB — FOLATE: Folate: 5.4 ng/mL — ABNORMAL LOW (ref >5.9)

## 2019-03-28 LAB — RETICULOCYTES
Immature Retic Fract: 20.3 % — ABNORMAL HIGH (ref 2.3–15.9)
RBC.: 3.79 MIL/uL — ABNORMAL LOW (ref 4.22–5.81)
Retic Count, Absolute: 44 10*3/uL (ref 19.0–186.0)
Retic Ct Pct: 1.2 % (ref 0.4–3.1)

## 2019-03-28 LAB — GLUCOSE, CAPILLARY
Glucose-Capillary: 138 mg/dL — ABNORMAL HIGH (ref 70–99)
Glucose-Capillary: 187 mg/dL — ABNORMAL HIGH (ref 70–99)
Glucose-Capillary: 195 mg/dL — ABNORMAL HIGH (ref 70–99)
Glucose-Capillary: 125 mg/dL — ABNORMAL HIGH (ref 70–99)

## 2019-03-28 LAB — PHOSPHORUS: Phosphorus: 3.1 mg/dL (ref 2.5–4.6)

## 2019-03-28 LAB — FERRITIN: Ferritin: 211 ng/mL (ref 24–336)

## 2019-03-28 LAB — MAGNESIUM: Magnesium: 1.9 mg/dL (ref 1.7–2.4)

## 2019-03-28 MED ORDER — SODIUM BICARBONATE-DEXTROSE 150-5 MEQ/L-% IV SOLN
150.0000 meq | INTRAVENOUS | Status: AC
  Administered 2019-03-28 (×2): 150 meq via INTRAVENOUS
  Filled 2019-03-28 (×2): qty 1000

## 2019-03-28 MED ORDER — POLYSACCHARIDE IRON COMPLEX 150 MG PO CAPS
150.0000 mg | ORAL_CAPSULE | Freq: Every day | ORAL | Status: DC
  Administered 2019-03-28 – 2019-03-29 (×2): 150 mg via ORAL
  Filled 2019-03-28 (×2): qty 1

## 2019-03-28 NOTE — Progress Notes (Signed)
Pharmacy Antibiotic Note  Noah Barnes is a 73 y.o. male admitted on 03/24/2019 with pneumonia.  Pharmacy has been consulted for Cefepime dosing. Vancomycin and Levofloxacin have been discontinued   Plan: Continue Cefepime 2g IV q24h Monitor renal function, cultures, clinical course  Height: 5\' 8"  (172.7 cm) Weight: 213 lb (96.6 kg) IBW/kg (Calculated) : 68.4  Temp (24hrs), Avg:98.9 F (37.2 C), Min:98.7 F (37.1 C), Max:99.1 F (37.3 C)  Recent Labs  Lab 03/24/19 1607 03/25/19 0048 03/26/19 0428 03/27/19 0040 03/28/19 0508  WBC 8.0  --  7.8 7.1 6.8  CREATININE 3.15* 2.96* 2.58* 2.57* 2.66*    Estimated Creatinine Clearance: 28.3 mL/min (A) (by C-G formula based on SCr of 2.66 mg/dL (H)).    Allergies  Allergen Reactions  . Ace Inhibitors Other (See Comments)    Severe AKI due to ARB + dehydration Aug 2016  . Angiotensin Receptor Blockers Other (See Comments)    Severe AKI due to ARB + dehydration Aug 2016    Antimicrobials this admission: 9/21 Vancomycin>> 9/23 9/21 Cefepime>> 9/21 Azithromycin x 1 9/22 LVQ >> 9/22  Microbiology results: 9/21-9/22 BCx: NGTD 9/21 COVID: negative  9/22 MRSA PCR: negative  9/22 HIV: non reactive   Thank you for allowing pharmacy to be a part of this patient's care.    Lindell Spar, PharmD, BCPS Clinical Pharmacist  03/28/2019 8:13 AM

## 2019-03-28 NOTE — Progress Notes (Signed)
[place] for IV Heparin Indication: PE/DVT  Allergies  Allergen Reactions  . Ace Inhibitors Other (See Comments)    Severe AKI due to ARB + dehydration Aug 2016  . Angiotensin Receptor Blockers Other (See Comments)    Severe AKI due to ARB + dehydration Aug 2016    Patient Measurements: Height: 5\' 8"  (172.7 cm) Weight: 213 lb (96.6 kg) IBW/kg (Calculated) : 68.4 Heparin Dosing Weight: 89.4 kg  Vital Signs: Temp: 98.7 F (37.1 C) (09/25 0415) Temp Source: Oral (09/25 0415) BP: 193/95 (09/25 0900) Pulse Rate: 72 (09/25 0900)  Labs: Recent Labs    03/26/19 0428 03/26/19 0824 03/26/19 1515  03/26/19 1707 03/27/19 0040 03/27/19 0906 03/28/19 0505 03/28/19 0508  HGB 10.3*  --   --   --   --  9.8*  --   --  10.3*  HCT 34.8*  --   --   --   --  31.6*  --   --  33.1*  PLT 239  --   --   --   --  228  --   --  281  APTT  --  34  --   --   --   --   --   --   --   LABPROT  --  14.9  --   --   --   --   --   --   --   INR  --  1.2  --   --   --   --   --   --   --   HEPARINUNFRC  --   --   --    < > 0.64 0.65 0.64  --  0.51  CREATININE 2.58*  --   --   --   --  2.57*  --  2.64* 2.66*  TROPONINIHS  --   --  31*  --  34*  --   --   --   --    < > = values in this interval not displayed.    Estimated Creatinine Clearance: 28.3 mL/min (A) (by C-G formula based on SCr of 2.66 mg/dL (H)).   Medical History: Past Medical History:  Diagnosis Date  . CHF (congestive heart failure) ([place])   . Chronic diastolic heart failure ([place])   . Constipation   . Diabetes mellitus   . Diverticulosis   . ED (erectile dysfunction)   . Gout   . Heart murmur   . Hyperlipidemia   . Hypertension   . IBS (irritable bowel syndrome)   . Renal calculus   . Renal insufficiency   . Tubular adenoma of colon     Assessment: 38 y/oM admitted on 03/24/2019 for right lower lobe pneumonia. D-dimer on admission elevated at 11.94. Unable to obtain CTPA due to  CKD3. VQ scan with intermediate probability for PE. Pharmacy consulted for IV heparin dosing. Lower extremity dopplers also + for bilateral DVT. Patient not on any anticoagulants PTA. He was on SQ heparin for VTE prophylaxis during this admission, which has been discontinued.   Today, 03/28/19:  AM Heparin level = 0.51 units/mL, remains therapeutic  CBC: Hgb low/stable at 10.3, Pltc WNL  No bleeding or infusion issues noted per nursing  Goal of Therapy:  Heparin level 0.3-0.7 units/ml Monitor platelets by anticoagulation protocol: Yes   Plan:   Continue heparin infusion at 1450 units/hr  Daily CBC, heparin level  Monitor closely for s/sx of bleeding  F/u for transition to PO anticoagulation    Lindell Spar, PharmD, BCPS Clinical Pharmacist  03/28/2019,9:48 AM

## 2019-03-28 NOTE — Progress Notes (Signed)
PROGRESS NOTE    Noah Barnes  BDZ:[id number] DOB: Feb 23, 1946 DOA: 03/24/2019 PCP: Noah Rail, MD   Brief Narrative:  Noah Barnes a 73 y.o.malewith medical history significant ofnephrolithiasis status post lithotripsy only last week, diastolic dysfunction CHF, diabetes, hypertension, gout, irritable bowel syndrome, diverticular disease and erectile dysfunction who came to the ER with progressive shortness of breath over the last couple of days. He had his lithotripsy about a week ago. He was in short stay and patient had anesthesia. Patient returned today with shortness of breath and cough. Associated with some low-grade temperature. He denied any significant sputum. He also has some right-sided chest pain which was dull ache with occasional sharp pain in the area. Highest was 6 out of 10. Patient seen and evaluated in the ER. Appears to have right lower lobe pneumonia on CT. He has acute on chronic kidney disease so unable to get CT angiogram to rule out PE. Patient is being admitted at this point with diagnoses of healthcare associated pneumonia possibly aspiration from his procedure last week..  ED Course:Temperature is 98.6 blood pressure 187/101 pulse 87 respiratory 30 oxygen sat 81% on room air currently 99% on 2 L. White count is 8.0 hemoglobin 12.3 and platelets 256. Sodium 139 potassium 5.1 chloride 109 CO2 of 19 with BUN 39 creatinine 3.15 his baseline is 1.7. Calcium 9.7.His albumin is 2.7. Troponin XX 1 and 26 BNP 204 d-dimer is 19. Chest x-ray showed no acute findings but CT chest without contrast showed patchy bilateral airspace opacities with tree-in-bud nodular density in the upper lobes bilaterally right greater than left more confluent consolidation in the right lower lobe. Findings concerning for an pneumonia. He has some nodule 5.6 mm in the left lower lobe. CT abdomen renal stone study showed the airspace consolidation in the right lower lobe with  bilateral nephrolithiasis left greater than right. He has 20 x 4 mm stone seen within the proximal left ureter with mild left hydronephrosis.  **Interim History  Patient's VQ scan was read as intermediate probability for PE and on examination he did have some swelling bilaterally.  Lower extremity venous duplex was done and did show bilateral DVTs.  Patient was started on a heparin drip and renal function is trending down with IV hydration. It was resumed but changed to sodium bicarbonate with 150 mEq at 75 mL's per hour for 24 hours to try to avoid volume overloading due to history of CHF.    Assessment & Plan:   Principal Problem:   HCAP (healthcare-associated pneumonia) Active Problems:   Diabetes mellitus with renal complications (Noah Barnes)   HYPERLIPIDEMIA   Essential hypertension   IBS (irritable bowel syndrome)   Acute on chronic kidney failure (Noah Barnes)   Chronic diastolic heart failure (Noah Barnes)  Right lower lobe pneumonia with acute respiratory failure with hypoxia, improving -Patient presenting to the ED with progressive shortness of breath and cough.  Recently underwent lithotripsy with anesthesia 1 week ago.   -Patient was noted to be hypoxic on room air oxygenating 81% but now oxygenation is improved significantly  -Patient was afebrile without leukocytosis but spiked a temperature of 100.4 few days ago and could be secondary to the PE.  BNP 204, D-dimer elevated at 11.94. COVID-19 negative. 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  -CT chest w/o contrast Concerning for right lower lobe consolidation.  Strep pneumo urinary antigen negative.   -Patient was started on vancomycin, Levaquin, and cefepime.  -Initially thought to have Low probability PE with  no tachycardia and review of EKG, no S in lead I, no Q wave in III, but twave inversion in lead III but similar in comparison to EKG dated 08/28/2018 on review but now given DVT's and V/Q Scan suspect PE -Discontinued Levaquin and Discontinued IV  Vancomycin given Negative PCR -C/w IV Cefepime for now and transition to p.o. antibiotics at discharge and today is day 5 of antibiotics -Continue to titrate supplemental oxygen to maintain SpO2 >92%; when I walked in he was titrated down and will wean to room air; currently off of oxygen and saturating well -Repeat ambulatory home O2 screen showed the patient did not require any oxygen discharge the patient in the next 24 to 48 hours she will be transitioned to an oral anticoagulant agent  Elevated D-Dimer with Suspected PE in the setting of Bilateral DVT's -D-Dimer was elevated on admission at 11.94 and repeat was 11.14 -V/Q showed "Triple matched abnormal ventilation, perfusion and radiographic abnormalities in the RIGHT lower lobe.  Slightly larger perfusion defect and ventilation defect at the lateral aspect of the lingula, with only minimal subsegmental atelectasis in the lingula on prior CT. Findings represent an intermediate probability for pulmonary embolism." -LE Duplex Preliminary Results show an acute right DVT involving the right posterior tibial veins with no cystic structure follow popliteal fossa and there is also an acute left-sided DVT involving the left popliteal vein with no cystic structure found in the popliteal fossa -Checked ECHOCardiogram to evaluate Right Heart Strain there is none -Anticoagulate with Heparin gtt for now and then transition to oral NOAC and likely to be Eliquis based on kidney function results and likely will transition tomorrow on 03/29/2019 -Discussed with Noah Barnes of medical oncology for further outpatient evaluation for hypercoagulable work-up -Continue supplemental oxygen via nasal cannula and wean O2 as tolerated -Repeated Ambulatory home oxygen to screen prior to discharge agai  Acute on Chronic GMW1U Metabolic Acidosis  Nephrolithiasis -Baseline creatinine 1.4-1.6 with GFR 49.  Recently underwent lithotripsy by Noah Barnes. CT renal stone study  on admission with Bilateral nephrolithiasis, left greater than right with mild left hydronephrosis -Patient's BUN/Cr is trending down and went from 39/3.15 and is now 25/2.64 -Avoid nephrotoxins, renally dose all medications and avoid contrast dyes as well as hypotension if possible -Patient CO2 was 16, anion gap was 9, chloride levels 115; Now CO2 is 20, AG is 10 and Chloride is 108 --Restarted IV fluids at 75 mL's per hour with sodium bicarbonate drip with 150 mEq given his metabolic acidosis for 20 more hours  -Pain control as below -Continue with Tamsulosin 0.4 mg p.o. daily per Urology  -Continue monitor and trend renal function daily and repeat CMP in a.m. -Patient may need further nephrological evaluation as an outpatient and will make referral -Discussed with Urology to see if further evaluation is warranted and the urology resident recommends obtaining a renal ultrasound and repeating creatinine; urology recommends aggressive IV and p.o. hydration as well as continuing medical management of the proximal left ureteral stone at this time.  They recommend if AKI worsens or patient becomes symptomatic they will need to consider a left ureteral stent placement but will hold off for now -Likely patient has been patient to full anticoagulation and be discharged home with outpatient neurology follow-up  Pulmonary nodule, LLL -Incidental finding on CT chest without contrast, 5-6 mm nodule  Left lower lobe.   -Recommend interval CT scan in 6-12 months.  T2DM -On Glimepiride 2 mg PO daily and Janumet 50-1000  PO BID at home. -Hemoglobin A1c 6.3 on 03/06/2019, well controlled. -Hold oral hypoglycemics while inpatient and I stopped his Glimperide as he was still on it this AM  -C/w Sensitive Novolog SSI AC/HS -CBGs ranging from 121-187  HLD -Continue Atorvastatin 20 mg po Daily   Chronic Diastolic congestive heart failure, compensated currently  Essential Hypertension -BP was significantly  elevated this AM and now improved . -Continue Clonidine 0.1 mg BID, metoprolol tartrate 100 mg BID, Hydralazine 25mg  BID -no ACEi secondary to renal function -Strict I's and O's and daily weights and continue to monitor her volume status carefully We will repeat echocardiogram to evaluate right heart strain given newly diagnosed DVTs -On aspirin and statin  Chronic Pain Syndrome -Continue Tramadol 50-100 mg po q12hprn Moderate -C/w Oxycodone-Acetaminophen 1 tab po q4hprn Moderate and Severe Pain   Normocytic AnemiaAnemia -Patient's hemoglobin/hematocrit went from 12.3/40.8 is now 9.8/31.6 -Checked Anemia Panel this Am and showed iron level of 29, U IBC 187, TIBC of 216, saturation ratio 13%, ferritin level 211, folate level 5.4, vitamin B12 level of 206 -Continue to monitor for signs and symptoms of bleeding now that he is anticoagulated with a heparin drip; currently no overt bleeding noted -Repeat CBC in a.m.  Obesity -Estimated body mass index is 32.39 kg/m as calculated from the following:   Height as of this encounter: 5\' 8"  (1.727 m).   Weight as of this encounter: 96.6 kg. -Weight Loss and Dietary Counseling given   DVT prophylaxis: Anticoagulated with Heparin gtt and will transition to Oral Anticoagulation in AM  Code Status: FULL CODE  Family Communication: No family present at bedside but discussed with  Disposition Plan: Pending further workup and evaluation and anticipate D/C home in the next 24-48 hours if stable  Consultants:   Discussed with Medical Oncology Noah Barnes  Urology  Discussed with Nephrology Dr. Carolin Sicks   Procedures:  LE VENOUS DUPLEX PRELIMINARY RESULTS Summary: Right: Findings consistent with acute deep vein thrombosis involving the right posterior tibial veins. No cystic structure found in the popliteal fossa. Left: Findings consistent with acute deep vein thrombosis involving the left popliteal vein. No cystic structure found in the popliteal  fossa.  ECHOCARDIOGRAM IMPRESSIONS    1. Left ventricular ejection fraction, by visual estimation, is 60 to 65%. The left ventricle has normal function. Normal left ventricular size. There is mildly increased left ventricular hypertrophy.  2. Left ventricular diastolic Doppler parameters are consistent with impaired relaxation pattern of LV diastolic filling.  3. Global right ventricle has normal systolic function.The right ventricular size is normal. No increase in right ventricular wall thickness.  4. Left atrial size was normal.  5. Right atrial size was normal.  6. The mitral valve is normal in structure. No evidence of mitral valve regurgitation. No evidence of mitral stenosis.  7. The tricuspid valve is normal in structure. Tricuspid valve regurgitation is mild.  8. The aortic valve is tricuspid Aortic valve regurgitation is trivial by color flow Doppler. Mild aortic valve sclerosis without stenosis.  9. The pulmonic valve was normal in structure. Pulmonic valve regurgitation is trivial by color flow Doppler. 10. Severely elevated pulmonary artery systolic pressure. 11. The inferior vena cava is normal in size with greater than 50% respiratory variability, suggesting right atrial pressure of 3 mmHg.  FINDINGS  Left Ventricle: Left ventricular ejection fraction, by visual estimation, is 60 to 65%. The left ventricle has normal function. There is mildly increased left ventricular hypertrophy. Normal left ventricular size. Spectral Doppler  shows Left ventricular  diastolic Doppler parameters are consistent with impaired relaxation pattern of LV diastolic filling.  Right Ventricle: The right ventricular size is normal. No increase in right ventricular wall thickness. Global RV systolic function is has normal systolic function. The tricuspid regurgitant velocity is 4.46 m/s, and with an assumed right atrial pressure  of 10 mmHg, the estimated right ventricular systolic pressure is  severely elevated at 89.6 mmHg.  Left Atrium: Left atrial size was normal in size.  Right Atrium: Right atrial size was normal in size  Pericardium: There is no evidence of pericardial effusion.  Mitral Valve: The mitral valve is normal in structure. No evidence of mitral valve stenosis by observation. No evidence of mitral valve regurgitation.  Tricuspid Valve: The tricuspid valve is normal in structure. Tricuspid valve regurgitation is mild by color flow Doppler.  Aortic Valve: The aortic valve is tricuspid. Aortic valve regurgitation is trivial by color flow Doppler. Mild aortic valve sclerosis is present, with no evidence of aortic valve stenosis.  Pulmonic Valve: The pulmonic valve was normal in structure. Pulmonic valve regurgitation is trivial by color flow Doppler.  Aorta: The aortic root, ascending aorta and aortic arch are all structurally normal, with no evidence of dilitation or obstruction.  Venous: The inferior vena cava is normal in size with greater than 50% respiratory variability, suggesting right atrial pressure of 3 mmHg.  IAS/Shunts: No atrial level shunt detected by color flow Doppler. No ventricular septal defect is seen or detected. There is no evidence of an atrial septal defect.     LEFT VENTRICLE PLAX 2D LVIDd:         4.30 cm  Diastology LVIDs:         2.90 cm  LV e' lateral:   0.12 cm/s LV PW:         1.30 cm  LV E/e' lateral: 7.0 LV IVS:        1.30 cm  LV e' medial:    0.08 cm/s LVOT diam:     2.00 cm  LV E/e' medial:  11.1 LV SV:         51 ml LV SV Index:   23.05 LVOT Area:     3.14 cm    RIGHT VENTRICLE RV Basal diam:  2.60 cm RV S prime:     12.80 cm/s TAPSE (M-mode): 2.5 cm  LEFT ATRIUM             Index LA diam:        3.50 cm 1.65 cm/m LA Vol (A2C):   53.7 ml 25.38 ml/m LA Vol (A4C):   46.1 ml 21.79 ml/m LA Biplane Vol: 51.8 ml 24.48 ml/m  AORTIC VALVE LVOT Vmax:   99.90 cm/s LVOT Vmean:  71.200 cm/s LVOT VTI:     0.227 m   AORTA Ao Root diam: 3.30 cm  MITRAL VALVE                         TRICUSPID VALVE MV Area (PHT): 3.85 cm              TR Peak grad:   79.6 mmHg MV PHT:        57.13 msec            TR Vmax:        451.00 cm/s MV Decel Time: 197 msec MV E velocity: 0.84 cm/s   103 cm/s  SHUNTS MV A velocity: 109.00 cm/s 70.3  cm/s Systemic VTI:  0.23 m MV E/A ratio:  0.01        1.5       Systemic Diam: 2.00 cm   Antimicrobials:  Anti-infectives (From admission, onward)   Start     Dose/Rate Route Frequency Ordered Stop   03/27/19 1000  vancomycin (VANCOCIN) IVPB 1000 mg/200 mL premix  Status:  Discontinued     1,000 mg 200 mL/hr over 60 Minutes Intravenous Every 24 hours 03/25/19 0214 03/25/19 1613   03/26/19 2200  vancomycin (VANCOCIN) IVPB 1000 mg/200 mL premix  Status:  Discontinued     1,000 mg 200 mL/hr over 60 Minutes Intravenous Every 48 hours 03/25/19 1613 03/26/19 0756   03/25/19 2200  ceFEPIme (MAXIPIME) 2 g in sodium chloride 0.9 % 100 mL IVPB     2 g 200 mL/hr over 30 Minutes Intravenous Every 24 hours 03/25/19 0214     03/25/19 1800  levofloxacin (LEVAQUIN) IVPB 750 mg  Status:  Discontinued     750 mg 100 mL/hr over 90 Minutes Intravenous Every 48 hours 03/24/19 2211 03/25/19 1331   03/24/19 2215  ceFEPIme (MAXIPIME) 2 g in sodium chloride 0.9 % 100 mL IVPB     2 g 200 mL/hr over 30 Minutes Intravenous  Once 03/24/19 2209 03/24/19 2256   03/24/19 2215  vancomycin (VANCOCIN) 2,000 mg in sodium chloride 0.9 % 500 mL IVPB     2,000 mg 250 mL/hr over 120 Minutes Intravenous  Once 03/24/19 2209 03/25/19 0038   03/24/19 2030  cefTRIAXone (ROCEPHIN) 1 g in sodium chloride 0.9 % 100 mL IVPB  Status:  Discontinued     1 g 200 mL/hr over 30 Minutes Intravenous  Once 03/24/19 2018 03/24/19 2209   03/24/19 2030  azithromycin (ZITHROMAX) 500 mg in sodium chloride 0.9 % 250 mL IVPB     500 mg 250 mL/hr over 60 Minutes Intravenous  Once 03/24/19 2018 03/24/19 2221      Subjective: Seen and examined at bedside sitting in chair and was doing well.  Was wearing oxygen this morning but weaned off of it and tolerated room air without desaturating.  Spoke with urology this morning.  Patient denies any lightheadedness or dizziness and thinks shortness of breath is improved.  Objective: Vitals:   03/28/19 1231 03/28/19 1232 03/28/19 1233 03/28/19 1309  BP:    115/77  Pulse:    67  Resp:    16  Temp:    97.7 F (36.5 C)  TempSrc:      SpO2: 92% (!) 89% 91% 93%  Weight:      Height:        Intake/Output Summary (Last 24 hours) at 03/28/2019 1602 Last data filed at 03/28/2019 0949 Gross per 24 hour  Intake 1119.48 ml  Output 150 ml  Net 969.48 ml   Filed Weights   03/25/19 0021 03/26/19 0432 03/28/19 0415  Weight: 96.3 kg 98.4 kg 96.6 kg   Examination: Physical Exam:  Constitutional: WN/WD obese AAM in NAD and appears calm and comfortable Eyes: Lids and conjunctivae normal, sclerae anicteric  ENMT: External Ears, Nose appear normal. Grossly normal hearing. Mucous membranes are moist.  Neck: Appears normal, supple, no cervical masses, normal ROM, no appreciable thyromegaly; no JVD Respiratory: Diminished to auscultation bilaterally, no wheezing, rales, rhonchi or crackles. Normal respiratory effort and patient is not tachypenic. No accessory muscle use.  Cardiovascular: RRR, no murmurs / rubs / gallops. S1 and S2 auscultated. 1+ LE extremity edema.  Abdomen: Soft, non-tender, Distended. No masses palpated. No appreciable hepatosplenomegaly. Bowel sounds positive x4.  GU: Deferred. Musculoskeletal: No clubbing / cyanosis of digits/nails. No joint deformity upper and lower extremities. Skin: No rashes, lesions, ulcers on a limited skin evaluation. No induration; Warm and dry.  Neurologic: CN 2-12 grossly intact with no focal deficits.  Romberg sign and cerebellar reflexes not assessed.   Psychiatric: Normal judgment and insight. Alert and oriented x  3. Normal mood and appropriate affect.   Data Reviewed: I have personally reviewed following labs and imaging studies  CBC: Recent Labs  Lab 03/24/19 1607 03/26/19 0428 03/27/19 0040 03/28/19 0508  WBC 8.0 7.8 7.1 6.8  NEUTROABS 6.5  --  5.2  --   HGB 12.3* 10.3* 9.8* 10.3*  HCT 40.8 34.8* 31.6* 33.1*  MCV 88.3 89.5 87.5 87.3  PLT 256 239 228 324   Basic Metabolic Panel: Recent Labs  Lab 03/24/19 1607 03/25/19 0048 03/26/19 0428 03/27/19 0040 03/28/19 0505 03/28/19 0508  NA 139 141 140 140 138  --   K 5.1 5.1 5.1 4.8 4.2  --   CL 109 112* 115* 115* 108  --   CO2 19* 15* 16* 16* 20*  --   GLUCOSE 140* 135* 116* 103* 158*  --   BUN 39* 35* 32* 29* 25*  --   CREATININE 3.15* 2.96* 2.58* 2.57* 2.64* 2.66*  CALCIUM 9.7 9.2 8.7* 8.7* 9.0  --   MG  --   --  1.7 1.7 1.9  --   PHOS  --   --   --  2.9 3.1  --    GFR: Estimated Creatinine Clearance: 28.3 mL/min (A) (by C-G formula based on SCr of 2.66 mg/dL (H)). Liver Function Tests: Recent Labs  Lab 03/24/19 1752 03/25/19 0048 03/27/19 0040 03/28/19 0505  AST 10* 13* 12* 14*  ALT 14 17 14 14   ALKPHOS 46 59 49 50  BILITOT 0.8 0.8 0.7 0.6  PROT 5.9* 6.9 5.8* 5.9*  ALBUMIN 2.7* 3.2* 2.5* 2.5*   No results for input(s): LIPASE, AMYLASE in the last 168 hours. No results for input(s): AMMONIA in the last 168 hours. Coagulation Profile: Recent Labs  Lab 03/26/19 0824  INR 1.2   Cardiac Enzymes: No results for input(s): CKTOTAL, CKMB, CKMBINDEX, TROPONINI in the last 168 hours. BNP (last 3 results) No results for input(s): PROBNP in the last 8760 hours. HbA1C: No results for input(s): HGBA1C in the last 72 hours. CBG: Recent Labs  Lab 03/27/19 1126 03/27/19 1622 03/27/19 2215 03/28/19 0749 03/28/19 1123  GLUCAP 180* 165* 121* 138* 187*   Lipid Profile: No results for input(s): CHOL, HDL, LDLCALC, TRIG, CHOLHDL, LDLDIRECT in the last 72 hours. Thyroid Function Tests: No results for input(s): TSH,  T4TOTAL, FREET4, T3FREE, THYROIDAB in the last 72 hours. Anemia Panel: Recent Labs    03/28/19 0508  VITAMINB12 206  FOLATE 5.4*  FERRITIN 211  TIBC 216*  IRON 29*  RETICCTPCT 1.2   Sepsis Labs: No results for input(s): PROCALCITON, LATICACIDVEN in the last 168 hours.  Recent Results (from the past 240 hour(s))  SARS Coronavirus 2 [facility] order, Performed in [facility] hospital lab) Nasopharyngeal Nasopharyngeal Swab     Status: None   Collection Time: 03/24/19  4:08 PM   Specimen: Nasopharyngeal Swab  Result Value Ref Range Status   SARS Coronavirus 2 NEGATIVE NEGATIVE Final    Comment: (NOTE) If result is NEGATIVE SARS-CoV-2 target nucleic acids are NOT DETECTED. The SARS-CoV-2 RNA  is generally detectable in upper and lower  respiratory specimens during the acute phase of infection. The lowest  concentration of SARS-CoV-2 viral copies this assay can detect is 250  copies / mL. A negative result does not preclude SARS-CoV-2 infection  and should not be used as the sole basis for treatment or other  patient management decisions.  A negative result may occur with  improper specimen collection / handling, submission of specimen other  than nasopharyngeal swab, presence of viral mutation(s) within the  areas targeted by this assay, and inadequate number of viral copies  (<250 copies / mL). A negative result must be combined with clinical  observations, patient history, and epidemiological information. If result is POSITIVE SARS-CoV-2 target nucleic acids are DETECTED. The SARS-CoV-2 RNA is generally detectable in upper and lower  respiratory specimens dur ing the acute phase of infection.  Positive  results are indicative of active infection with SARS-CoV-2.  Clinical  correlation with patient history and other diagnostic information is  necessary to determine patient infection status.  Positive results do  not rule out bacterial infection or co-infection with other  viruses. If result is PRESUMPTIVE POSTIVE SARS-CoV-2 nucleic acids MAY BE PRESENT.   A presumptive positive result was obtained on the submitted specimen  and confirmed on repeat testing.  While 2019 novel coronavirus  (SARS-CoV-2) nucleic acids may be present in the submitted sample  additional confirmatory testing may be necessary for epidemiological  and / or clinical management purposes  to differentiate between  SARS-CoV-2 and other Sarbecovirus currently known to infect humans.  If clinically indicated additional testing with an alternate test  methodology [phone]) is advised. The SARS-CoV-2 RNA is generally  detectable in upper and lower respiratory sp ecimens during the acute  phase of infection. The expected result is Negative. Fact Sheet for Patients:  StrictlyIdeas.no Fact Sheet for Healthcare Providers: BankingDealers.co.za This test is not yet approved or cleared by the [place] FDA and has been authorized for detection and/or diagnosis of SARS-CoV-2 by FDA under an Emergency Use Authorization (EUA).  This EUA will remain in effect (meaning this test can be used) for the duration of the COVID-19 declaration under Section 564(b)(1) of the Act, 21 U.S.C. section 360bbb-3(b)(1), unless the authorization is terminated or revoked sooner. Performed at [facility], [place] [address], [place], [place] [address]   Blood culture (routine x 2)     Status: None (Preliminary result)   Collection Time: 03/24/19  9:15 PM   Specimen: BLOOD  Result Value Ref Range Status   Specimen Description   Final    BLOOD RIGHT ANTECUBITAL Performed at [place] [address] [place], [place] [address]    Special Requests   Final    BOTTLES DRAWN AEROBIC AND ANAEROBIC Blood Culture adequate volume Performed at [place] [address], [place], [place] [address]    Culture    Final    NO GROWTH 4 DAYS Performed at [facility], [place] [address], [place], [place] [address]    Report Status PENDING  Incomplete  Blood culture (routine x 2)     Status: None (Preliminary result)   Collection Time: 03/25/19 12:48 AM   Specimen: BLOOD RIGHT HAND  Result Value Ref Range Status   Specimen Description   Final    BLOOD RIGHT HAND Performed at [facility], [place] [address], [place],  [address]    Special Requests   Final  BOTTLES DRAWN AEROBIC ONLY Blood Culture adequate volume Performed at [place] [address] [place], [place] [address]    Culture   Final    NO GROWTH 3 DAYS Performed at [facility], [place] [address], [place], [place] [address]    Report Status PENDING  Incomplete  MRSA PCR Screening     Status: None   Collection Time: 03/25/19  1:31 PM   Specimen: Nasal Mucosa; Nasopharyngeal  Result Value Ref Range Status   MRSA by PCR NEGATIVE NEGATIVE Final    Comment:        The GeneXpert MRSA Assay (FDA approved for NASAL specimens only), is one component of a comprehensive MRSA colonization surveillance program. It is not intended to diagnose MRSA infection nor to guide or monitor treatment for MRSA infections. Performed at [facility], [place] [address] [place], [place] [address]     Radiology Studies: US Renal  Result Date: 03/27/2019 CLINICAL DATA:  Acute kidney injury. Technologist notes state lithotripsy on the right 1 week ago. EXAM: RENAL / URINARY TRACT ULTRASOUND COMPLETE COMPARISON:  Noncontrast CT 03/24/2019 FINDINGS: Right Kidney: Renal measurements: 11.1 x 5.5 x 5.3 cm = volume: 168 mL. 2.2 cm simple cyst in the upper right kidney. Small stone lower right kidney on prior CT is not resolved sonographically. Echogenicity within normal limits. No solid mass or hydronephrosis visualized. Left Kidney: Renal measurements: 12.4 x 6.6 x 5.4 cm = volume: 232 mL. Mild  hydronephrosis. 14 mm shadowing calculus in the lower left kidney. The proximal ureteral calculi on prior CT are not definitively seen. Parapelvic cyst measuring 19 mm. No solid mass. Bladder: Appears normal for degree of bladder distention. IMPRESSION: 1. Mild left hydronephrosis, also seen on CT 3 days ago. The previous proximal left ureteral calculi are not visualized sonographically. Nonobstructing stone in the lower left kidney. 2. No right hydronephrosis. Nonobstructing right renal stone on CT is not visualized. 3. Bilateral renal cysts. Electronically Signed   By: Keith Rake M.D.   On: 03/27/2019 22:06   Scheduled Meds: . allopurinol  300 mg Oral Daily  . aspirin EC  81 mg Oral q morning - 10a  . atorvastatin  20 mg Oral Daily  . cloNIDine  0.1 mg Oral BID  . hydrALAZINE  25 mg Oral BID  . insulin aspart  0-5 Units Subcutaneous QHS  . insulin aspart  0-9 Units Subcutaneous TID WC  . iron polysaccharides  150 mg Oral Daily  . linaclotide  290 mcg Oral QAC breakfast  . metoprolol tartrate  100 mg Oral BID  . tamsulosin  0.4 mg Oral QPC supper   Continuous Infusions: . ceFEPime (MAXIPIME) IV 2 g (03/27/19 2136)  . heparin 1,450 Units/hr (03/28/19 1217)  . sodium bicarbonate 150 mEq in dextrose 5% 1000 mL 150 mEq (03/28/19 0949)    LOS: 4 days   Kerney Elbe, DO [facility] PAGER is on [place]  If 7PM-7AM, please contact night-coverage [email or web address] Password [email or web address] 03/28/2019, 4:02 PM

## 2019-03-28 NOTE — Progress Notes (Signed)
SATURATION QUALIFICATIONS: (This note is used to comply with regulatory documentation for home oxygen)  Patient Saturations on Room Air at Rest = 91-92%  Patient Saturations on Room Air while Ambulating = 89-92%

## 2019-03-28 NOTE — Consult Note (Addendum)
Urology Consult   Physician requesting consult: Raiford Noble  Reason for consult: Nephrolithiasis  History of Present Illness: Noah Barnes is a 73 y.o. with a history of nephrolithiasis.  He is status post ESWL for stone approximately 10 days ago.  He is now hospitalized with pneumonia and with evidence of DVT and PE on heparin drip.  He is also noted to have a new AKI.  Creatinine was 3.15 on admission from baseline of 1.8.  This has been downtrending over the past few days to 2.6 with IV fluids.  CT scan on admission did demonstrate residual left proximal ureteral stone with some mild left hydronephrosis.  There was also a known nonobstructing bilateral nephrolithiasis.  Renal ultrasound on 03/27/2019 redemonstrated mild left hydronephrosis with left ureteral stones not clearly seen.  He is asymptomatic from his left ureteral stone.  He denies flank pain, hematuria, or voiding symptoms.  Urine output not recorded consistently so unable to evaluate UOP.  Past Medical History:  Diagnosis Date  . CHF (congestive heart failure) ([place])   . Chronic diastolic heart failure ([place])   . Constipation   . Diabetes mellitus   . Diverticulosis   . ED (erectile dysfunction)   . Gout   . Heart murmur   . Hyperlipidemia   . Hypertension   . IBS (irritable bowel syndrome)   . Renal calculus   . Renal insufficiency   . Tubular adenoma of colon     Past Surgical History:  Procedure Laterality Date  . COLONOSCOPY  1998   negative; no F/U  . EXTRACORPOREAL SHOCK WAVE LITHOTRIPSY Left 03/20/2019   Procedure: EXTRACORPOREAL SHOCK WAVE LITHOTRIPSY (ESWL);  Surgeon: Cleon Gustin, MD;  Location: [facility];  Service: Urology;  Laterality: Left;  . knee effusion tapped      Current Hospital Medications:  Home Meds:  No current facility-administered medications on file prior to encounter.    Current Outpatient Medications on File Prior to Encounter  Medication Sig Dispense Refill  .  allopurinol (ZYLOPRIM) 300 MG tablet Take 1 tablet by mouth once daily (Patient taking differently: Take 300 mg by mouth daily. ) 90 tablet 1  . atorvastatin (LIPITOR) 20 MG tablet Take 1 tablet by mouth once daily (Patient taking differently: Take 20 mg by mouth daily. ) 90 tablet 0  . cloNIDine (CATAPRES) 0.1 MG tablet Take 1 tablet (0.1 mg total) by mouth 2 (two) times daily. Follow-up appt is due in Sept must see provider for future refills 180 tablet 0  . glimepiride (AMARYL) 2 MG tablet TAKE 1 TABLET BY MOUTH ONCE DAILY BEFORE BREAKFAST (Patient taking differently: Take 2 mg by mouth daily with breakfast. ) 30 tablet 0  . hydrALAZINE (APRESOLINE) 25 MG tablet Take 25 mg by mouth 2 (two) times daily.    . Lancets (ONETOUCH DELICA PLUS [id number]) MISC USE AS DIRECTED TWICE A DAILY (Patient taking differently: 1 application by Other route 2 (two) times daily. ) 100 each 0  . LINZESS 290 MCG CAPS capsule TAKE 1 CAPSULE BY MOUTH ONCE DAILY BEFORE BREAKFAST (Patient taking differently: Take 290 mcg by mouth daily before breakfast. ) 30 capsule 1  . metoprolol tartrate (LOPRESSOR) 100 MG tablet Take 1 tablet by mouth twice daily (Patient taking differently: Take 100 mg by mouth 2 (two) times daily. ) 180 tablet 0  . oxyCODONE-acetaminophen (PERCOCET) 5-325 MG tablet Take 1 tablet by mouth every 4 (four) hours as needed for moderate pain or severe pain. 30 tablet 0  .  sildenafil (VIAGRA) 100 MG tablet TAKE AS DIRECTED (Patient taking differently: Take 100 mg by mouth as needed for erectile dysfunction. ) 6 tablet 1  . sitaGLIPtin-metformin (JANUMET) 50-1000 MG tablet TAKE 1 TABLET BY MOUTH TWICE DAILY WITH MEALS (Patient taking differently: Take 1 tablet by mouth 2 (two) times daily with a meal. ) 180 tablet 2  . tamsulosin (FLOMAX) 0.4 MG CAPS capsule Take 1 capsule (0.4 mg total) by mouth daily after supper. 30 capsule 0  . traMADol (ULTRAM) 50 MG tablet Take 50-100 mg by mouth every 6 (six) hours as  needed.    [name] aspirin EC 81 MG tablet Take 81 mg by mouth every morning.    . furosemide (LASIX) 20 MG tablet TAKE 1 TO 2 TABLETS BY MOUTH ONCE DAILY AS NEEDED FOR  FLUID  EDEMA (Patient not taking: No sig reported) 90 tablet 3  . ONETOUCH ULTRA test strip USE TO TEST TWICE DAILY (Patient taking differently: 1 each by Other route 2 (two) times daily. ) 100 strip 3     Scheduled Meds: . allopurinol  300 mg Oral Daily  . aspirin EC  81 mg Oral q morning - 10a  . atorvastatin  20 mg Oral Daily  . cloNIDine  0.1 mg Oral BID  . hydrALAZINE  25 mg Oral BID  . insulin aspart  0-5 Units Subcutaneous QHS  . insulin aspart  0-9 Units Subcutaneous TID WC  . linaclotide  290 mcg Oral QAC breakfast  . metoprolol tartrate  100 mg Oral BID  . tamsulosin  0.4 mg Oral QPC supper   Continuous Infusions: . ceFEPime (MAXIPIME) IV 2 g (03/27/19 2136)  . heparin 1,450 Units/hr (03/27/19 1905)   PRN Meds:.oxyCODONE-acetaminophen, traMADol  Allergies:  Allergies  Allergen Reactions  . Ace Inhibitors Other (See Comments)    Severe AKI due to ARB + dehydration Aug 2016  . Angiotensin Receptor Blockers Other (See Comments)    Severe AKI due to ARB + dehydration Aug 2016    Family History  Problem Relation Age of Onset  . Heart attack Mother 42  . Hypertension Mother   . Prostate cancer Father 8  . Diabetes Father   . Irritable bowel syndrome Father   . Colon cancer Brother        dx in his late 26's  . Irritable bowel syndrome Sister        x 2  . Irritable bowel syndrome Brother   . Stroke Neg Hx   . Esophageal cancer Neg Hx   . Rectal cancer Neg Hx   . Stomach cancer Neg Hx     Social History:  reports that he quit smoking about 27 years ago. His smoking use included cigarettes. He smoked 0.30 packs per day. He has never used smokeless tobacco. He reports current alcohol use of about 2.0 standard drinks of alcohol per week. He reports that he does not use drugs.  ROS: A complete review  of systems was performed.  All systems are negative except for pertinent findings as noted.  Physical Exam:  Vital signs in last 24 hours: Temp:  [98.7 F (37.1 C)-99.1 F (37.3 C)] 98.7 F (37.1 C) (09/25 0415) Pulse Rate:  [62-68] 68 (09/25 0415) Resp:  [16-18] 18 (09/25 0415) BP: (122-168)/(69-96) 159/96 (09/25 0415) SpO2:  [91 %-96 %] 91 % (09/25 0415) Weight:  [96.6 kg] 96.6 kg (09/25 0415) Constitutional:  Alert and oriented, No acute distress Cardiovascular: Regular rate  Respiratory: Normal respiratory effort  GI: Abdomen is soft, nontender, nondistended, no abdominal masses GU: No CVA tenderness bilaterally Lymphatic: No lymphadenopathy Neurologic: Grossly intact, no focal deficits Psychiatric: Normal mood and affect  Laboratory Data:  Recent Labs    03/26/19 0428 03/27/19 0040 03/28/19 0508  WBC 7.8 7.1 6.8  HGB 10.3* 9.8* 10.3*  HCT 34.8* 31.6* 33.1*  PLT 239 228 281    Recent Labs    03/26/19 0428 03/27/19 0040 03/28/19 0508  NA 140 140  --   K 5.1 4.8  --   CL 115* 115*  --   GLUCOSE 116* 103*  --   BUN 32* 29*  --   CALCIUM 8.7* 8.7*  --   CREATININE 2.58* 2.57* 2.66*     Results for orders placed or performed during the hospital encounter of 03/24/19 (from the past 24 hour(s))  Glucose, capillary     Status: Abnormal   Collection Time: 03/27/19  7:28 AM  Result Value Ref Range   Glucose-Capillary 103 (H) 70 - 99 mg/dL  Heparin level (unfractionated)     Status: None   Collection Time: 03/27/19  9:06 AM  Result Value Ref Range   Heparin Unfractionated 0.64 0.30 - 0.70 IU/mL  Glucose, capillary     Status: Abnormal   Collection Time: 03/27/19 11:26 AM  Result Value Ref Range   Glucose-Capillary 180 (H) 70 - 99 mg/dL  Glucose, capillary     Status: Abnormal   Collection Time: 03/27/19  4:22 PM  Result Value Ref Range   Glucose-Capillary 165 (H) 70 - 99 mg/dL  Glucose, capillary     Status: Abnormal   Collection Time: 03/27/19 10:15 PM   Result Value Ref Range   Glucose-Capillary 121 (H) 70 - 99 mg/dL  CBC     Status: Abnormal   Collection Time: 03/28/19  5:08 AM  Result Value Ref Range   WBC 6.8 4.0 - 10.5 K/uL   RBC 3.79 (L) 4.22 - 5.81 MIL/uL   Hemoglobin 10.3 (L) 13.0 - 17.0 g/dL   HCT 33.1 (L) 39.0 - 52.0 %   MCV 87.3 80.0 - 100.0 fL   MCH 27.2 26.0 - 34.0 pg   MCHC 31.1 30.0 - 36.0 g/dL   RDW 13.2 11.5 - 15.5 %   Platelets 281 150 - 400 K/uL   nRBC 0.0 0.0 - 0.2 %  Heparin level (unfractionated)     Status: None   Collection Time: 03/28/19  5:08 AM  Result Value Ref Range   Heparin Unfractionated 0.51 0.30 - 0.70 IU/mL  Creatinine, serum     Status: Abnormal   Collection Time: 03/28/19  5:08 AM  Result Value Ref Range   Creatinine, Ser 2.66 (H) 0.61 - 1.24 mg/dL   GFR calc non Af Amer 23 (L) >60 mL/min   GFR calc Af Amer 27 (L) >60 mL/min  Vitamin B12     Status: None   Collection Time: 03/28/19  5:08 AM  Result Value Ref Range   Vitamin B-12 206 180 - 914 pg/mL  Folate     Status: Abnormal   Collection Time: 03/28/19  5:08 AM  Result Value Ref Range   Folate 5.4 (L) >5.9 ng/mL  Iron and TIBC     Status: Abnormal   Collection Time: 03/28/19  5:08 AM  Result Value Ref Range   Iron 29 (L) 45 - 182 ug/dL   TIBC 216 (L) 250 - 450 ug/dL   Saturation Ratios 13 (L) 17.9 - 39.5 %  UIBC 187 ug/dL  Ferritin     Status: None   Collection Time: 03/28/19  5:08 AM  Result Value Ref Range   Ferritin 211 24 - 336 ng/mL  Reticulocytes     Status: Abnormal   Collection Time: 03/28/19  5:08 AM  Result Value Ref Range   Retic Ct Pct 1.2 0.4 - 3.1 %   RBC. 3.79 (L) 4.22 - 5.81 MIL/uL   Retic Count, Absolute 44.0 19.0 - 186.0 K/uL   Immature Retic Fract 20.3 (H) 2.3 - 15.9 %   Recent Results (from the past 240 hour(s))  SARS Coronavirus 2 [facility] order, Performed in [facility] hospital lab) Nasopharyngeal Nasopharyngeal Swab     Status: None   Collection Time: 03/24/19  4:08 PM   Specimen: Nasopharyngeal  Swab  Result Value Ref Range Status   SARS Coronavirus 2 NEGATIVE NEGATIVE Final    Comment: (NOTE) If result is NEGATIVE SARS-CoV-2 target nucleic acids are NOT DETECTED. The SARS-CoV-2 RNA is generally detectable in upper and lower  respiratory specimens during the acute phase of infection. The lowest  concentration of SARS-CoV-2 viral copies this assay can detect is 250  copies / mL. A negative result does not preclude SARS-CoV-2 infection  and should not be used as the sole basis for treatment or other  patient management decisions.  A negative result may occur with  improper specimen collection / handling, submission of specimen other  than nasopharyngeal swab, presence of viral mutation(s) within the  areas targeted by this assay, and inadequate number of viral copies  (<250 copies / mL). A negative result must be combined with clinical  observations, patient history, and epidemiological information. If result is POSITIVE SARS-CoV-2 target nucleic acids are DETECTED. The SARS-CoV-2 RNA is generally detectable in upper and lower  respiratory specimens dur ing the acute phase of infection.  Positive  results are indicative of active infection with SARS-CoV-2.  Clinical  correlation with patient history and other diagnostic information is  necessary to determine patient infection status.  Positive results do  not rule out bacterial infection or co-infection with other viruses. If result is PRESUMPTIVE POSTIVE SARS-CoV-2 nucleic acids MAY BE PRESENT.   A presumptive positive result was obtained on the submitted specimen  and confirmed on repeat testing.  While 2019 novel coronavirus  (SARS-CoV-2) nucleic acids may be present in the submitted sample  additional confirmatory testing may be necessary for epidemiological  and / or clinical management purposes  to differentiate between  SARS-CoV-2 and other Sarbecovirus currently known to infect humans.  If clinically indicated  additional testing with an alternate test  methodology [phone]) is advised. The SARS-CoV-2 RNA is generally  detectable in upper and lower respiratory sp ecimens during the acute  phase of infection. The expected result is Negative. Fact Sheet for Patients:  StrictlyIdeas.no Fact Sheet for Healthcare Providers: BankingDealers.co.za This test is not yet approved or cleared by the [place] FDA and has been authorized for detection and/or diagnosis of SARS-CoV-2 by FDA under an Emergency Use Authorization (EUA).  This EUA will remain in effect (meaning this test can be used) for the duration of the COVID-19 declaration under Section 564(b)(1) of the Act, 21 U.S.C. section 360bbb-3(b)(1), unless the authorization is terminated or revoked sooner. Performed at [facility], [place] [address], [place], [place] [address]   Blood culture (routine x 2)     Status: None (Preliminary result)   Collection Time: 03/24/19  9:15 PM  Specimen: BLOOD  Result Value Ref Range Status   Specimen Description   Final    BLOOD RIGHT ANTECUBITAL Performed at [place] [address] [place], [place] [address]    Special Requests   Final    BOTTLES DRAWN AEROBIC AND ANAEROBIC Blood Culture adequate volume Performed at [place] [address], [place], [place] [address]    Culture   Final    NO GROWTH 3 DAYS Performed at [facility], [place] [address] [place], [place] [address]    Report Status PENDING  Incomplete  Blood culture (routine x 2)     Status: None (Preliminary result)   Collection Time: 03/25/19 12:48 AM   Specimen: BLOOD RIGHT HAND  Result Value Ref Range Status   Specimen Description   Final    BLOOD RIGHT HAND Performed at [facility], [place] [address], [place], [place] [address]    Special Requests   Final    BOTTLES DRAWN AEROBIC ONLY Blood Culture adequate  volume Performed at [place] [address], [place], [place] [address]    Culture   Final    NO GROWTH 2 DAYS Performed at [place] [address], [place], [place] [address]    Report Status PENDING  Incomplete  MRSA PCR Screening     Status: None   Collection Time: 03/25/19  1:31 PM   Specimen: Nasal Mucosa; Nasopharyngeal  Result Value Ref Range Status   MRSA by PCR NEGATIVE NEGATIVE Final    Comment:        The GeneXpert MRSA Assay (FDA approved for NASAL specimens only), is one component of a comprehensive MRSA colonization surveillance program. It is not intended to diagnose MRSA infection nor to guide or monitor treatment for MRSA infections. Performed at [facility], [place] [address] [place], [place] [address]     Renal Function: Recent Labs    03/24/19 1607 03/25/19 0048 03/26/19 0428 03/27/19 0040 03/28/19 0508  CREATININE 3.15* 2.96* 2.58* 2.57* 2.66*   Estimated Creatinine Clearance: 28.3 mL/min (A) (by C-G formula based on SCr of 2.66 mg/dL (H)).  Radiologic Imaging: US Renal  Result Date: 03/27/2019 CLINICAL DATA:  Acute kidney injury. Technologist notes state lithotripsy on the right 1 week ago. EXAM: RENAL / URINARY TRACT ULTRASOUND COMPLETE COMPARISON:  Noncontrast CT 03/24/2019 FINDINGS: Right Kidney: Renal measurements: 11.1 x 5.5 x 5.3 cm = volume: 168 mL. 2.2 cm simple cyst in the upper right kidney. Small stone lower right kidney on prior CT is not resolved sonographically. Echogenicity within normal limits. No solid mass or hydronephrosis visualized. Left Kidney: Renal measurements: 12.4 x 6.6 x 5.4 cm = volume: 232 mL. Mild hydronephrosis. 14 mm shadowing calculus in the lower left kidney. The proximal ureteral calculi on prior CT are not definitively seen. Parapelvic cyst measuring 19 mm. No solid mass. Bladder: Appears normal for degree of bladder distention. IMPRESSION: 1. Mild left  hydronephrosis, also seen on CT 3 days ago. The previous proximal left ureteral calculi are not visualized sonographically. Nonobstructing stone in the lower left kidney. 2. No right hydronephrosis. Nonobstructing right renal stone on CT is not visualized. 3. Bilateral renal cysts. Electronically Signed   By: Keith Rake M.D.   On: 03/27/2019 22:06   Vas [place] Lower Extremity Venous (dvt)  Result Date: 03/26/2019  Lower Venous Study Indications: Edema.  Risk Factors: None identified. Comparison Study: No prior studies. Performing Technologist:  Oliver Hum RVT  Examination Guidelines: A complete evaluation includes B-mode imaging, spectral Doppler, color Doppler, and power Doppler as needed of all accessible portions of each vessel. Bilateral testing is considered an integral part of a complete examination. Limited examinations for reoccurring indications may be performed as noted.  +---------+---------------+---------+-----------+----------+--------------+ RIGHT    CompressibilityPhasicitySpontaneityPropertiesThrombus Aging +---------+---------------+---------+-----------+----------+--------------+ CFV      Full           Yes      Yes                                 +---------+---------------+---------+-----------+----------+--------------+ SFJ      Full                                                        +---------+---------------+---------+-----------+----------+--------------+ FV Prox  Full                                                        +---------+---------------+---------+-----------+----------+--------------+ FV Mid   Full                                                        +---------+---------------+---------+-----------+----------+--------------+ FV DistalFull                                                        +---------+---------------+---------+-----------+----------+--------------+ PFV      Full                                                         +---------+---------------+---------+-----------+----------+--------------+ POP      Full           Yes      Yes                                 +---------+---------------+---------+-----------+----------+--------------+ PTV      Partial                                      Acute          +---------+---------------+---------+-----------+----------+--------------+ PERO     Full                                                        +---------+---------------+---------+-----------+----------+--------------+   +---------+---------------+---------+-----------+----------+--------------+ LEFT  CompressibilityPhasicitySpontaneityPropertiesThrombus Aging +---------+---------------+---------+-----------+----------+--------------+ CFV      Full           Yes      Yes                                 +---------+---------------+---------+-----------+----------+--------------+ SFJ      Full                                                        +---------+---------------+---------+-----------+----------+--------------+ FV Prox  Full                                                        +---------+---------------+---------+-----------+----------+--------------+ FV Mid   Full                                                        +---------+---------------+---------+-----------+----------+--------------+ FV DistalFull                                                        +---------+---------------+---------+-----------+----------+--------------+ PFV      Full                                                        +---------+---------------+---------+-----------+----------+--------------+ POP      Partial        Yes      Yes                  Acute          +---------+---------------+---------+-----------+----------+--------------+ PTV      None                                                         +---------+---------------+---------+-----------+----------+--------------+ PERO     None                                                        +---------+---------------+---------+-----------+----------+--------------+     Summary: Right: Findings consistent with acute deep vein thrombosis involving the right posterior tibial veins. No cystic structure found in the popliteal fossa. Left: Findings consistent with acute deep vein thrombosis involving the left popliteal vein. No cystic structure found in the popliteal fossa.  *See table(s) above for measurements and observations. Electronically signed by Durene Fruits  Brabham MD on 03/26/2019 at 2:14:37 PM.    Final     I independently reviewed the above imaging studies.  Impression/Recommendation 73 year old gentleman with recent history of ESWL currently hospitalized for pneumonia and for treatment of DVT/ PE on heparin drip.  Given need for anticoagulation, patient is currently asymptomatic, left hydronephrosis is mild, and AKI is starting to improve, would recommend medical management of proximal left ureteral stone at this time.  If AKI worsens or patient becomes symptomatic, then we will need to consider left ureteral stent placement. We are unable to comment on urine output as it has not been consistently recorded.   Recommend aggressive IV and p.o. hydration.  Also please start patient on Flomax 0.4 mg daily to aid in medical expulsive therapy. Please record strict ins and outs so that urine output can be monitored. I have instructed patient to advise the hospitalist team if he notices that he passes any stones.  Please advise urology if AKI significantly worsens patient or becomes symptomatic from his left ureteral stone.  Haskel Schroeder 03/28/2019, 7:22 AM

## 2019-03-29 DIAGNOSIS — I82409 Acute embolism and thrombosis of unspecified deep veins of unspecified lower extremity: Secondary | ICD-10-CM

## 2019-03-29 LAB — CBC WITH DIFFERENTIAL/PLATELET
Abs Immature Granulocytes: 0.06 10*3/uL (ref 0.00–0.07)
Basophils Absolute: 0 10*3/uL (ref 0.0–0.1)
Basophils Relative: 1 %
Eosinophils Absolute: 0.3 10*3/uL (ref 0.0–0.5)
Eosinophils Relative: 4 %
HCT: 32.4 % — ABNORMAL LOW (ref 39.0–52.0)
Hemoglobin: 10 g/dL — ABNORMAL LOW (ref 13.0–17.0)
Immature Granulocytes: 1 %
Lymphocytes Relative: 14 %
Lymphs Abs: 1 10*3/uL (ref 0.7–4.0)
MCH: 26.5 pg (ref 26.0–34.0)
MCHC: 30.9 g/dL (ref 30.0–36.0)
MCV: 85.7 fL (ref 80.0–100.0)
Monocytes Absolute: 0.6 10*3/uL (ref 0.1–1.0)
Monocytes Relative: 9 %
Neutro Abs: 5.1 10*3/uL (ref 1.7–7.7)
Neutrophils Relative %: 71 %
Platelets: 290 10*3/uL (ref 150–400)
RBC: 3.78 MIL/uL — ABNORMAL LOW (ref 4.22–5.81)
RDW: 13.2 % (ref 11.5–15.5)
WBC: 7.1 10*3/uL (ref 4.0–10.5)
nRBC: 0 % (ref 0.0–0.2)

## 2019-03-29 LAB — COMPREHENSIVE METABOLIC PANEL
ALT: 19 U/L (ref 0–44)
AST: 17 U/L (ref 15–41)
Albumin: 2.5 g/dL — ABNORMAL LOW (ref 3.5–5.0)
Alkaline Phosphatase: 47 U/L (ref 38–126)
Anion gap: 8 (ref 5–15)
BUN: 22 mg/dL (ref 8–23)
CO2: 27 mmol/L (ref 22–32)
Calcium: 8.9 mg/dL (ref 8.9–10.3)
Chloride: 104 mmol/L (ref 98–111)
Creatinine, Ser: 2.16 mg/dL — ABNORMAL HIGH (ref 0.61–1.24)
GFR calc Af Amer: 34 mL/min — ABNORMAL LOW (ref >60)
GFR calc non Af Amer: 30 mL/min — ABNORMAL LOW (ref >60)
Glucose, Bld: 175 mg/dL — ABNORMAL HIGH (ref 70–99)
Potassium: 4.1 mmol/L (ref 3.5–5.1)
Sodium: 139 mmol/L (ref 135–145)
Total Bilirubin: 0.4 mg/dL (ref 0.3–1.2)
Total Protein: 5.9 g/dL — ABNORMAL LOW (ref 6.5–8.1)

## 2019-03-29 LAB — GLUCOSE, CAPILLARY
Glucose-Capillary: 138 mg/dL — ABNORMAL HIGH (ref 70–99)
Glucose-Capillary: 154 mg/dL — ABNORMAL HIGH (ref 70–99)

## 2019-03-29 LAB — PHOSPHORUS: Phosphorus: 2.5 mg/dL (ref 2.5–4.6)

## 2019-03-29 LAB — CULTURE, BLOOD (ROUTINE X 2)
Culture: NO GROWTH
Special Requests: ADEQUATE

## 2019-03-29 LAB — HEPARIN LEVEL (UNFRACTIONATED): Heparin Unfractionated: 0.34 IU/mL (ref 0.30–0.70)

## 2019-03-29 LAB — MAGNESIUM: Magnesium: 1.6 mg/dL — ABNORMAL LOW (ref 1.7–2.4)

## 2019-03-29 MED ORDER — APIXABAN 5 MG PO TABS: ORAL_TABLET | ORAL | 0 refills | Status: DC

## 2019-03-29 MED ORDER — HYDRALAZINE HCL 50 MG PO TABS: 50.0000 mg | ORAL_TABLET | Freq: Two times a day (BID) | ORAL | 0 refills | Status: DC

## 2019-03-29 MED ORDER — POLYETHYLENE GLYCOL 3350 17 G PO PACK: 17.0000 g | PACK | Freq: Every day | ORAL | Status: DC

## 2019-03-29 MED ORDER — CEFDINIR 300 MG PO CAPS: 300.0000 mg | ORAL_CAPSULE | Freq: Two times a day (BID) | ORAL | 0 refills | Status: DC

## 2019-03-29 MED ORDER — FLEET ENEMA 7-19 GM/118ML RE ENEM
1.0000 | ENEMA | Freq: Once | RECTAL | Status: AC
  Administered 2019-03-29: 1 via RECTAL
  Filled 2019-03-29: qty 1

## 2019-03-29 MED ORDER — HYDRALAZINE HCL 50 MG PO TABS
50.0000 mg | ORAL_TABLET | Freq: Two times a day (BID) | ORAL | Status: DC
  Administered 2019-03-29: 50 mg via ORAL
  Filled 2019-03-29: qty 1

## 2019-03-29 MED ORDER — POLYSACCHARIDE IRON COMPLEX 150 MG PO CAPS: 150.0000 mg | ORAL_CAPSULE | Freq: Every day | ORAL | 0 refills | Status: DC

## 2019-03-29 MED ORDER — SODIUM CHLORIDE 0.9 % IV SOLN
2.0000 g | Freq: Two times a day (BID) | INTRAVENOUS | Status: DC
  Administered 2019-03-29: 2 g via INTRAVENOUS
  Filled 2019-03-29 (×2): qty 2

## 2019-03-29 MED ORDER — MAGNESIUM SULFATE 2 GM/50ML IV SOLN
2.0000 g | Freq: Once | INTRAVENOUS | Status: AC
  Administered 2019-03-29: 2 g via INTRAVENOUS
  Filled 2019-03-29: qty 50

## 2019-03-29 MED ORDER — CEFDINIR 300 MG PO CAPS
300.0000 mg | ORAL_CAPSULE | Freq: Two times a day (BID) | ORAL | Status: DC
  Filled 2019-03-29: qty 1

## 2019-03-29 MED ORDER — SITAGLIPTIN PHOSPHATE 50 MG PO TABS: 50.0000 mg | ORAL_TABLET | Freq: Every day | ORAL | 0 refills | Status: DC

## 2019-03-29 MED ORDER — TAMSULOSIN HCL 0.4 MG PO CAPS: 0.4000 mg | ORAL_CAPSULE | Freq: Every day | ORAL | Status: DC

## 2019-03-29 MED ORDER — APIXABAN 5 MG PO TABS: 5.0000 mg | ORAL_TABLET | Freq: Two times a day (BID) | ORAL | Status: DC

## 2019-03-29 MED ORDER — SODIUM BICARBONATE-DEXTROSE 150-5 MEQ/L-% IV SOLN
150.0000 meq | INTRAVENOUS | Status: DC
  Administered 2019-03-29: 150 meq via INTRAVENOUS
  Filled 2019-03-29 (×2): qty 1000

## 2019-03-29 MED ORDER — APIXABAN 5 MG PO TABS
10.0000 mg | ORAL_TABLET | Freq: Two times a day (BID) | ORAL | Status: DC
  Administered 2019-03-29: 10 mg via ORAL
  Filled 2019-03-29: qty 2

## 2019-03-29 NOTE — Progress Notes (Signed)
Notified Dr. Alfredia Ferguson that per casemanagement that he has to send prescription to patient's outpatient pharmacy on order to get the price for Eliquis because of the time of day.  MD stated he will F/U tomorrow before discharge.

## 2019-03-29 NOTE — Discharge Summary (Signed)
Physician Discharge Summary  Noah Barnes QVZ:[id number] DOB: 10/03/1945 DOA: 03/24/2019  PCP: Binnie Rail, MD  Admit date: 03/24/2019 Discharge date: 03/29/2019  Admitted From: Home Disposition: Home  Recommendations for Outpatient Follow-up:  1. Follow up with PCP in 1-2 weeks 2. Follow up with Nephrology 3. Follow up with Hematology 4. Follow up with Urology 5. Repeat CT Chest for Lung Nodule in 6-12 months  6. Please obtain BMP/CBC in one week 7. Please follow up on the following pending results:  Home Health: No Equipment/Devices: None    Discharge Condition: Stable CODE STATUS: FULL CODE  Diet recommendation: Heart Healthy Carb Modified Diet   Brief/Interim Summary: Noah Barnes a 73 y.o.malewith medical history significant ofnephrolithiasis status post lithotripsy only last week, diastolic dysfunction CHF, diabetes, hypertension, gout, irritable bowel syndrome, diverticular disease and erectile dysfunction who came to the ER with progressive shortness of breath over the last couple of days. He had his lithotripsy about a week ago. He was in short stay and patient had anesthesia. Patient returned today with shortness of breath and cough. Associated with some low-grade temperature. He denied any significant sputum. He also has some right-sided chest pain which was dull ache with occasional sharp pain in the area. Highest was 6 out of 10. Patient seen and evaluated in the ER. Appears to have right lower lobe pneumonia on CT. He has acute on chronic kidney disease so unable to get CT angiogram to rule out PE. Patient is being admitted at this point with diagnoses of healthcare associated pneumonia possibly aspiration from his procedure last week..  ED Course:Temperature is 98.6 blood pressure 187/101 pulse 87 respiratory 30 oxygen sat 81% on room air currently 99% on 2 L. White count is 8.0 hemoglobin 12.3 and platelets 256. Sodium 139 potassium 5.1 chloride  109 CO2 of 19 with BUN 39 creatinine 3.15 his baseline is 1.7. Calcium 9.7.His albumin is 2.7. Troponin XX 1 and 26 BNP 204 d-dimer is 19. Chest x-ray showed no acute findings but CT chest without contrast showed patchy bilateral airspace opacities with tree-in-bud nodular density in the upper lobes bilaterally right greater than left more confluent consolidation in the right lower lobe. Findings concerning for an pneumonia. He has some nodule 5.6 mm in the left lower lobe. CT abdomen renal stone study showed the airspace consolidation in the right lower lobe with bilateral nephrolithiasis left greater than right. He has 20 x 4 mm stone seen within the proximal left ureter with mild left hydronephrosis.  **Interim History  Patient's VQ scan was read as intermediate probability for PE and on examination he did have some swelling bilaterally.  Lower extremity venous duplex was done and did show bilateral DVTs.  Patient was started on a heparin drip and renal function is trending down with IV hydration. It was resumed but changed to sodium bicarbonate with 150 mEq at 75 mL's per hour for 24 hours to try to avoid volume overloading due to history of CHF.  Patient's bicarbonate level and his renal function improved and he was transitioned off of heparin drip to oral Eliquis.  He is deemed stable for discharge and he will need to follow-up with PCP, nephrology, urology in outpatient setting.   Discharge Diagnoses:  Principal Problem:   HCAP (healthcare-associated pneumonia) Active Problems:   Diabetes mellitus with renal complications ([place])   HYPERLIPIDEMIA   Essential hypertension   IBS (irritable bowel syndrome)   Acute on chronic kidney failure (HCC)   Chronic diastolic  heart failure (HCC)  Right lower lobe pneumonia with acute respiratory failure with hypoxia, improving -Patient presenting to the ED with progressive shortness of breath and cough. Recently underwent lithotripsy with  anesthesia 1 week ago.  -Patient was noted to be hypoxic on room air oxygenating 81% but now oxygenation is improved significantly -Patient was afebrile without leukocytosis but spiked a temperature of 100.4 few days ago and could be secondary to the PE. BNP 204, D-dimer elevated at 11.94. COVID-19 negative. 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  -CT chest w/o contrast Concerning for right lower lobe consolidation. Strep pneumo urinary antigen negative.  -Patient was started on vancomycin, Levaquin, and cefepime.  -Initially thought to have Low probability PE with no tachycardia and review of EKG, no S in lead I, no Q wave in III, but twave inversion in lead III but similar in comparison to EKG dated 08/28/2018 on review but now given DVT's and V/Q Scan suspect PE -Discontinued Levaquin and Discontinued IV Vancomycin given Negative PCR -C/w IV Cefepime for now and transition to p.o. antibiotics at discharge and today is day 5 of antibiotics and he was transitioned to p.o. cefdinir for 2 more days -Continue to titrate supplemental oxygen to maintain SpO2 >92%; when I walked in he was titrated down and will wean to room air; currently off of oxygen and saturating well -Repeat ambulatory home O2 screen showed the patient did not require any oxygen  -Patient is no longer hypoxic at discharge and will need to repeat chest x-ray in 3 to 6 weeks and follow-up with PCP within 1 week  Elevated D-Dimer with Suspected PE in the setting of Bilateral DVT's -D-Dimer was elevated on admission at 11.94 and repeat was 11.14 -V/Q showed "Triple matched abnormal ventilation, perfusion and radiographic abnormalities in the RIGHT lower lobe.  Slightly larger perfusion defect and ventilation defect at the lateral aspect of the lingula, with only minimal subsegmental atelectasis in the lingula on prior CT. Findings represent an intermediate probability for pulmonary embolism." -LE Duplex Preliminary Results show an acute  right DVT involving the right posterior tibial veins with no cystic structure follow popliteal fossa and there is also an acute left-sided DVT involving the left popliteal vein with no cystic structure found in the popliteal fossa -Checked ECHOCardiogram to evaluate Right Heart Strain there is none -Anticoagulate with Heparin gtt for now and then transition to oral NOAC and likely to be Eliquis based on kidney function results and likely will transition tomorrow on 03/29/2019 -Discussed with Dr. Maylon Peppers of medical oncology for further outpatient evaluation for hypercoagulable work-up -Continue supplemental oxygen via nasal cannula and wean O2 as tolerated -Repeated Ambulatory home oxygen to screen prior to discharge and he did not desaturate -Follow-up with PCP and determine if Eliquis is affordable going forward and follow-up with hematology  Acute on Chronic [id number] Metabolic Acidosis  Nephrolithiasis -Baseline creatinine 1.4-1.6 with GFR 49. Recently underwent lithotripsy by Dr. Alyson Ingles. CT renal stone study on admission with Bilateral nephrolithiasis, left greater than right with mild left hydronephrosis -Patient's BUN/Cr is trending down and went from 39/3.15 and is now  22/2.16 -Avoid nephrotoxins, renally dose all medications and avoid contrast dyes as well as hypotension if possible -Patient CO2 was 16, anion gap was 9, chloride levels 115; Now CO2 is 27 AG is 8 and Chloride is 140 --estarted IV fluids at 75 mL's per hour with sodium bicarbonate drip with 150 mEq given his metabolic acidosis and will stop prior to discharge  -Pain  control as below -Continue with Tamsulosin 0.4 mg p.o. daily per Urology  -Continue monitor and trend renal function daily and repeat CMP in a.m. -Patient may need further nephrological evaluation as an outpatient and will make referral -Discussed with Urology to see if further evaluation is warranted and the urology resident recommends obtaining a renal ultrasound  and repeating creatinine; urology recommends aggressive IV and p.o. hydration as well as continuing medical management of the proximal left ureteral stone at this time.  They recommend if AKI worsens or patient becomes symptomatic they will need to consider a left ureteral stent placement but will hold off for now -Follow-up with urology and nephrology in the outpatient setting  Pulmonary nodule, LLL -Incidental finding on CT chest without contrast, 5-6 mm nodule Left lower lobe.  -Recommend interval CT scan in 6-12 months as an outpatient with PCP to follow-up  T2DM -On Glimepiride 2 mg PO daily and Janumet 50-1000 PO BID at home.  We will discontinue the Janumet at home and discontinue Januvia 50 mg p.o. daily given his renal function and defer to PCP to further adjust medication -Hemoglobin A1c 6.3 on 03/06/2019, well controlled. -Hold oral hypoglycemics while inpatient and I stopped his Glimperide while he was hospitalized but can be resumed at discharge -C/w Sensitive Novolog SSI AC/HS -CBGs ranging from 125- 195  HLD -Continue Atorvastatin 20 mg po Daily   Chronic Diastolic congestive heart failure, compensated currently  Essential Hypertension -BP was significantly elevated this AM and now improved . -Continue Clonidine 0.1 mg BID, metoprolol tartrate 100 mg BID, Hydralazine 25mg  BID -no ACEi secondary to renal function -Strict I's and O's and daily weights and continue to monitor her volume status carefully -Echocardiogram to evaluate right heart strain given newly diagnosed DVTs and did not show any; echocardiogram as well -On aspirin and statin  Chronic Pain Syndrome -Continue Tramadol 50-100 mg po q12hprn Moderate -C/w Oxycodone-Acetaminophen 1 tab po q4hprn Moderate and Severe Pain   Normocytic AnemiaAnemia -Patient's hemoglobin/hematocrit went from 12.3/40.8 is now stable at 10.0/32.4 -Checked Anemia Panel this Am and showed iron level of 29, U IBC 187, TIBC of  216, saturation ratio 13%, ferritin level 211, folate level 5.4, vitamin B12 level of 206 -Continue to monitor for signs and symptoms of bleeding now that he is anticoagulated with a heparin drip; currently no overt bleeding noted -Repeat CBC in a.m.  Obesity -Estimated body mass index is 32.39 kg/m as calculated from the following:   Height as of this encounter: 5\' 8"  (1.727 m).   Weight as of this encounter: 96.6 kg. -Weight Loss and Dietary Counseling given   Hypomagnesemia -Patient mag level is 1.6 -Replete with IV mag sulfate 2 g -Continue to monitor replete as necessary -Repeat magnesium level in outpatient setting  Discharge Instructions  Discharge Instructions    Call MD for:  difficulty breathing, headache or visual disturbances   Complete by: As directed    Call MD for:  extreme fatigue   Complete by: As directed    Call MD for:  hives   Complete by: As directed    Call MD for:  persistant dizziness or light-headedness   Complete by: As directed    Call MD for:  persistant nausea and vomiting   Complete by: As directed    Call MD for:  redness, tenderness, or signs of infection (pain, swelling, redness, odor or green/yellow discharge around incision site)   Complete by: As directed    Call MD for:  severe uncontrolled pain   Complete by: As directed    Call MD for:  temperature >100.4   Complete by: As directed    Diet - low sodium heart healthy   Complete by: As directed    Diet Carb Modified   Complete by: As directed    Discharge instructions   Complete by: As directed    You were cared for by a hospitalist during your hospital stay. If you have any questions about your discharge medications or the care you received while you were in the hospital after you are discharged, you can call the unit and ask to speak with the hospitalist on call if the hospitalist that took care of you is not available. Once you are discharged, your primary care physician will handle  any further medical issues. Please note that NO REFILLS for any discharge medications will be authorized once you are discharged, as it is imperative that you return to your primary care physician (or establish a relationship with a primary care physician if you do not have one) for your aftercare needs so that they can reassess your need for medications and monitor your lab values.  Follow up with PCP, Hematology, Nephrology, and Urology in the outpatient setting. Take all medications as prescribed. If symptoms change or worsen please return to the ED for evaluation   Increase activity slowly   Complete by: As directed      Allergies as of 03/29/2019      Reactions   Ace Inhibitors Other (See Comments)   Severe AKI due to ARB + dehydration Aug 2016   Angiotensin Receptor Blockers Other (See Comments)   Severe AKI due to ARB + dehydration Aug 2016      Medication List    STOP taking these medications   sitaGLIPtin-metformin 50-1000 MG tablet Commonly known as: Janumet     TAKE these medications   allopurinol 300 MG tablet Commonly known as: ZYLOPRIM Take 1 tablet by mouth once daily   apixaban 5 MG Tabs tablet Commonly known as: ELIQUIS Take 10 mg (two 5 mg tabs) twice a day for 7 days and then 5 mg po BID after   aspirin EC 81 MG tablet Take 81 mg by mouth every morning.   atorvastatin 20 MG tablet Commonly known as: LIPITOR Take 1 tablet by mouth once daily   cefdinir 300 MG capsule Commonly known as: OMNICEF Take 1 capsule (300 mg total) by mouth every 12 (twelve) hours.   cloNIDine 0.1 MG tablet Commonly known as: CATAPRES Take 1 tablet (0.1 mg total) by mouth 2 (two) times daily. Follow-up appt is due in Sept must see provider for future refills   furosemide 20 MG tablet Commonly known as: LASIX TAKE 1 TO 2 TABLETS BY MOUTH ONCE DAILY AS NEEDED FOR  FLUID  EDEMA   glimepiride 2 MG tablet Commonly known as: AMARYL TAKE 1 TABLET BY MOUTH ONCE DAILY BEFORE  BREAKFAST What changed:   how much to take  how to take this  when to take this  additional instructions   hydrALAZINE 50 MG tablet Commonly known as: APRESOLINE Take 1 tablet (50 mg total) by mouth 2 (two) times daily. What changed:   medication strength  how much to take   iron polysaccharides 150 MG capsule Commonly known as: NIFEREX Take 1 capsule (150 mg total) by mouth daily. Start taking on: March 30, 2019   Linzess 290 MCG Caps capsule Generic drug: linaclotide TAKE 1 CAPSULE BY  MOUTH ONCE DAILY BEFORE BREAKFAST What changed: See the new instructions.   metoprolol tartrate 100 MG tablet Commonly known as: LOPRESSOR Take 1 tablet by mouth twice daily   OneTouch Delica Plus [id number] Misc USE AS DIRECTED TWICE A DAILY What changed: See the new instructions.   OneTouch Ultra test strip Generic drug: glucose blood USE TO TEST TWICE DAILY What changed: See the new instructions.   oxyCODONE-acetaminophen 5-325 MG tablet Commonly known as: Percocet Take 1 tablet by mouth every 4 (four) hours as needed for moderate pain or severe pain.   sildenafil 100 MG tablet Commonly known as: VIAGRA TAKE AS DIRECTED What changed:   how much to take  how to take this  when to take this  reasons to take this  additional instructions   sitaGLIPtin 50 MG tablet Commonly known as: Januvia Take 1 tablet (50 mg total) by mouth daily.   tamsulosin 0.4 MG Caps capsule Commonly known as: Flomax Take 1 capsule (0.4 mg total) by mouth daily after supper.   traMADol 50 MG tablet Commonly known as: ULTRAM Take 50-100 mg by mouth every 6 (six) hours as needed.      Follow-up Information    Binnie Rail, MD. Call.   Specialty: Internal Medicine Why: Follow up within 1-2 weeks Contact information: [place] [address] [phone]        Sherren Mocha, MD .   Specialty: Cardiology Contact information: [address] [place] [address] [phone]        Tish Men, MD. Call.   Specialties: Hematology, Oncology Why: Follow up for Hypercoaguable Workup Contact information: [place] [address] [phone]        Ardis Hughs, MD. Call.   Specialty: Urology Why: Follow up within 1-2 weeks Contact information: [place] [address] [phone]        [place], [place]. Call.   Why: Call for Follow up Appointment in 1-2 weeks  Contact information: [address] [place] [address] [phone]          Allergies  Allergen Reactions  . Ace Inhibitors Other (See Comments)    Severe AKI due to ARB + dehydration Aug 2016  . Angiotensin Receptor Blockers Other (See Comments)    Severe AKI due to ARB + dehydration Aug 2016   Consultations:  Urology  Discussed Case with Hematology  Discussed Case with Nephrology  Procedures/Studies: Ct Abdomen Pelvis Wo Contrast  Result Date: 03/02/2019 CLINICAL DATA:  73 year old male with acute LEFT abdominal and pelvic pain for 2 days. EXAM: CT ABDOMEN AND PELVIS WITHOUT CONTRAST TECHNIQUE: Multidetector CT imaging of the abdomen and pelvis was performed following the standard protocol without IV contrast. COMPARISON:  03/02/2015 FINDINGS: Please note that parenchymal abnormalities may be missed without intravenous contrast. Lower chest: No acute abnormality. Hepatobiliary: The liver and gallbladder are unremarkable. No biliary dilatation. Pancreas: Unremarkable Spleen: Unremarkable Adrenals/Urinary Tract: A 12 mm calculus is present within the LEFT renal pelvis with mild fullness of the LEFT renal calices, both new since 2016. A nonobstructing 7 mm calculus within the RIGHT LOWER kidney and 10 mm calculus within the LEFT LOWER kidney are noted. A RIGHT renal cyst is present. The adrenal glands and bladder are unremarkable. Stomach/Bowel: Stomach is within normal limits. Appendix appears normal. No evidence  of bowel wall thickening, distention, or inflammatory changes. Colonic diverticulosis noted without evidence of diverticulitis. Vascular/Lymphatic: Aortic atherosclerosis. No enlarged abdominal  or pelvic lymph nodes. Reproductive: Prostate is unremarkable. Other: No ascites, pneumoperitoneum or focal collection. Musculoskeletal: No acute or suspicious bony abnormalities. Mild degenerative changes in the lumbar spine and hips noted. IMPRESSION: 1. 12 mm calculus within the LEFT renal pelvis with mild fullness of the LEFT renal calices, both new since 2016. 2. Bilateral nephrolithiasis 3. Colonic diverticulosis without evidence of diverticulitis. 4.  Aortic Atherosclerosis (ICD10-I70.0). Electronically Signed   By: Margarette Canada M.D.   On: 03/02/2019 15:07   Dg Abd 1 View  Result Date: 03/20/2019 CLINICAL DATA:  Preoperative exam for lithotripsy on the left. EXAM: ABDOMEN - 1 VIEW COMPARISON:  CT 03/02/2019 FINDINGS: Redemonstrated is a 12 mm stone in the left renal pelvis and an indistinct 1 cm stone in the lower pole. 5 mm stone evident in the lower pole of the right kidney. No sign of ureteral stone. Bowel gas pattern is normal. No significant bone finding. IMPRESSION: 12 mm stone in the left renal pelvis region. 1 cm stone in the left lower pole. 5 mm stone in the right lower pole. Electronically Signed   By: Nelson Chimes M.D.   On: 03/20/2019 09:32   Ct Chest Wo Contrast  Result Date: 03/24/2019 CLINICAL DATA:  Cough, chest pain, shortness of breath EXAM: CT CHEST WITHOUT CONTRAST TECHNIQUE: Multidetector CT imaging of the chest was performed following the standard protocol without IV contrast. COMPARISON:  Chest x-ray earlier today FINDINGS: Cardiovascular: Scattered aortic and coronary artery calcifications. Heart is normal size. Aorta is normal caliber. Mediastinum/Nodes: No mediastinal, hilar, or axillary adenopathy. Lungs/Pleura: Patchy airspace opacities in the upper lobes, right greater than left,  most pronounced peripherally. Some associated tree-in-bud nodular densities in both upper lobes. Areas of consolidation noted in the right lower lobe/lung base. Probable left base atelectasis. Small right pleural effusion. Upper Abdomen: Imaging into the upper abdomen shows no acute findings. Musculoskeletal: Chest wall soft tissues are unremarkable. No acute bony abnormality. IMPRESSION: Patchy bilateral airspace opacities with tree-in-bud nodular densities in the upper lobes bilaterally, right greater than left. More confluent consolidation in the right lower lobe. Findings concerning for pneumonia. 5-6 mm nodule in the left lower lobe along the fissure. Non-contrast chest CT at 6-12 months is recommended. If the nodule is stable at time of repeat CT, then future CT at 18-24 months (from today's scan) is considered optional for low-risk patients, but is recommended for high-risk patients. This recommendation follows the consensus statement: Guidelines for Management of Incidental Pulmonary Nodules Detected on CT Images: From the Fleischner Society 2017; Radiology 2017; 284:228-243. Small right pleural effusion. Coronary artery disease. Aortic Atherosclerosis (ICD10-I70.0). Electronically Signed   By: Rolm Baptise M.D.   On: 03/24/2019 19:51   US Renal  Result Date: 03/27/2019 CLINICAL DATA:  Acute kidney injury. Technologist notes state lithotripsy on the right 1 week ago. EXAM: RENAL / URINARY TRACT ULTRASOUND COMPLETE COMPARISON:  Noncontrast CT 03/24/2019 FINDINGS: Right Kidney: Renal measurements: 11.1 x 5.5 x 5.3 cm = volume: 168 mL. 2.2 cm simple cyst in the upper right kidney. Small stone lower right kidney on prior CT is not resolved sonographically. Echogenicity within normal limits. No solid mass or hydronephrosis visualized. Left Kidney: Renal measurements: 12.4 x 6.6 x 5.4 cm = volume: 232 mL. Mild hydronephrosis. 14 mm shadowing calculus in the lower left kidney. The proximal ureteral calculi on  prior CT are not definitively seen. Parapelvic cyst measuring 19 mm. No solid mass. Bladder: Appears normal for degree of bladder distention. IMPRESSION: 1.  Mild left hydronephrosis, also seen on CT 3 days ago. The previous proximal left ureteral calculi are not visualized sonographically. Nonobstructing stone in the lower left kidney. 2. No right hydronephrosis. Nonobstructing right renal stone on CT is not visualized. 3. Bilateral renal cysts. Electronically Signed   By: Keith Rake M.D.   On: 03/27/2019 22:06   Nm Pulmonary Vent And Perf (v/q Scan)  Result Date: 03/25/2019 CLINICAL DATA:  Elevated D-dimer, progressive shortness of breath, cough, mild fever, some RIGHT-side chest pain, question pulmonary embolism EXAM: NUCLEAR MEDICINE VENTILATION - PERFUSION LUNG SCAN TECHNIQUE: Ventilation images were obtained in multiple projections using inhaled aerosol Tc-28m DTPA. Perfusion images were obtained in multiple projections after intravenous injection of Tc-2m MAA. RADIOPHARMACEUTICALS:  29.5 mCi of Tc-39m DTPA aerosol inhalation and 1.57 mCi Tc61m MAA IV COMPARISON:  Chest radiograph 03/24/2019, CT chest 03/24/2019 FINDINGS: Ventilation: Focal ventilatory defects at the base of the RIGHT lower lobe and at the lateral aspect of the lingula. Diminished ventilation at LEFT apex. Mild elevation of RIGHT diaphragm. Perfusion: Matching diminished perfusion in RIGHT lower lobe and LEFT apex. Mild elevation of RIGHT diaphragm. Larger perfusion defect than ventilation defect at lateral aspect of lingula. Chest CT: RIGHT upper lobe infiltrate. Subsegmental atelectasis BILATERAL lower lobes greater on RIGHT. Minimal subsegmental atelectasis in lingula. IMPRESSION: Triple matched abnormal ventilation, perfusion and radiographic abnormalities in the RIGHT lower lobe. Slightly larger perfusion defect and ventilation defect at the lateral aspect of the lingula, with only minimal subsegmental atelectasis in the lingula  on prior CT. Findings represent an intermediate probability for pulmonary embolism. Electronically Signed   By: Lavonia Dana M.D.   On: 03/25/2019 16:22   Dg Chest Portable 1 View  Result Date: 03/24/2019 CLINICAL DATA:  Cough and right chest pain since yesterday. EXAM: PORTABLE CHEST 1 VIEW COMPARISON:  PA and lateral chest 09/05/2016. FINDINGS: Lung volumes are low but the lungs are clear. Elevation the right hemidiaphragm relative to the left noted. Heart size is upper normal. No pneumothorax or pleural fluid. IMPRESSION: No acute disease. Electronically Signed   By: Inge Rise M.D.   On: 03/24/2019 16:39   Dg Abd 2 Views  Result Date: 03/02/2019 CLINICAL DATA:  Left lower quadrant abdominal pain for the past 2 days. EXAM: ABDOMEN - 2 VIEW COMPARISON:  Abdominal x-ray dated February 26, 2017. FINDINGS: The bowel gas pattern is normal. There is no evidence of free air. Unchanged 11 mm left renal calculus. No acute osseous abnormality. IMPRESSION: 1. No acute findings. 2. Unchanged left nephrolithiasis. Electronically Signed   By: Titus Dubin M.D.   On: 03/02/2019 11:48   Ct Renal Stone Study  Result Date: 03/24/2019 CLINICAL DATA:  Cough, right side pain. EXAM: CT ABDOMEN AND PELVIS WITHOUT CONTRAST TECHNIQUE: Multidetector CT imaging of the abdomen and pelvis was performed following the standard protocol without IV contrast. COMPARISON:  03/02/2019 FINDINGS: Lower chest: Airspace opacities in the right lower lobe concerning for pneumonia. Left base atelectasis. Trace right effusion. Hepatobiliary: No focal hepatic abnormality. Gallbladder unremarkable. Pancreas: No focal abnormality or ductal dilatation. Spleen: No focal abnormality.  Normal size. Adrenals/Urinary Tract: Multiple left renal stones are noted. Elongated stone is seen within the proximal left ureter with a cross-sectional diameter of 4 mm, extending over approximately 2 cm length. There is mild left hydronephrosis. Small  nonobstructing stone in the lower pole of the right kidney. No hydronephrosis on the right. Urinary bladder and adrenal glands unremarkable. Stomach/Bowel: Sigmoid diverticulosis. No active diverticulitis. Normal appendix.  Stomach and small bowel decompressed, unremarkable. Vascular/Lymphatic: Aortic atherosclerosis. No enlarged abdominal or pelvic lymph nodes. Reproductive: No visible focal abnormality. Other: No free fluid or free air. Musculoskeletal: No acute bony abnormality. IMPRESSION: Airspace consolidation in the right lower lobe with trace right effusion. Findings concerning for pneumonia. Bilateral nephrolithiasis, left greater than right. 20 x 4 mm elongated stone or stones seen within the proximal left ureter with mild left hydronephrosis. Aortic atherosclerosis. Electronically Signed   By: Rolm Baptise M.D.   On: 03/24/2019 19:56   Vas [place] Lower Extremity Venous (dvt)  Result Date: 03/26/2019  Lower Venous Study Indications: Edema.  Risk Factors: None identified. Comparison Study: No prior studies. Performing Technologist: Oliver Hum RVT  Examination Guidelines: A complete evaluation includes B-mode imaging, spectral Doppler, color Doppler, and power Doppler as needed of all accessible portions of each vessel. Bilateral testing is considered an integral part of a complete examination. Limited examinations for reoccurring indications may be performed as noted.  +---------+---------------+---------+-----------+----------+--------------+ RIGHT    CompressibilityPhasicitySpontaneityPropertiesThrombus Aging +---------+---------------+---------+-----------+----------+--------------+ CFV      Full           Yes      Yes                                 +---------+---------------+---------+-----------+----------+--------------+ SFJ      Full                                                        +---------+---------------+---------+-----------+----------+--------------+ FV Prox   Full                                                        +---------+---------------+---------+-----------+----------+--------------+ FV Mid   Full                                                        +---------+---------------+---------+-----------+----------+--------------+ FV DistalFull                                                        +---------+---------------+---------+-----------+----------+--------------+ PFV      Full                                                        +---------+---------------+---------+-----------+----------+--------------+ POP      Full           Yes      Yes                                 +---------+---------------+---------+-----------+----------+--------------+ PTV  Partial                                      Acute          +---------+---------------+---------+-----------+----------+--------------+ PERO     Full                                                        +---------+---------------+---------+-----------+----------+--------------+   +---------+---------------+---------+-----------+----------+--------------+ LEFT     CompressibilityPhasicitySpontaneityPropertiesThrombus Aging +---------+---------------+---------+-----------+----------+--------------+ CFV      Full           Yes      Yes                                 +---------+---------------+---------+-----------+----------+--------------+ SFJ      Full                                                        +---------+---------------+---------+-----------+----------+--------------+ FV Prox  Full                                                        +---------+---------------+---------+-----------+----------+--------------+ FV Mid   Full                                                        +---------+---------------+---------+-----------+----------+--------------+ FV DistalFull                                                         +---------+---------------+---------+-----------+----------+--------------+ PFV      Full                                                        +---------+---------------+---------+-----------+----------+--------------+ POP      Partial        Yes      Yes                  Acute          +---------+---------------+---------+-----------+----------+--------------+ PTV      None                                                        +---------+---------------+---------+-----------+----------+--------------+ PERO  None                                                        +---------+---------------+---------+-----------+----------+--------------+     Summary: Right: Findings consistent with acute deep vein thrombosis involving the right posterior tibial veins. No cystic structure found in the popliteal fossa. Left: Findings consistent with acute deep vein thrombosis involving the left popliteal vein. No cystic structure found in the popliteal fossa.  *See table(s) above for measurements and observations. Electronically signed by Harold Barban MD on 03/26/2019 at 2:14:37 PM.    Final     LE VENOUS DUPLEX PRELIMINARY RESULTS Summary: Right: Findings consistent with acute deep vein thrombosis involving the right posterior tibial veins. No cystic structure found in the popliteal fossa. Left: Findings consistent with acute deep vein thrombosis involving the left popliteal vein. No cystic structure found in the popliteal fossa.  ECHOCARDIOGRAM IMPRESSIONS   1. Left ventricular ejection fraction, by visual estimation, is 60 to 65%. The left ventricle has normal function. Normal left ventricular size. There is mildly increased left ventricular hypertrophy. 2. Left ventricular diastolic Doppler parameters are consistent with impaired relaxation pattern of LV diastolic filling. 3. Global right ventricle has normal systolic function.The right ventricular size is  normal. No increase in right ventricular wall thickness. 4. Left atrial size was normal. 5. Right atrial size was normal. 6. The mitral valve is normal in structure. No evidence of mitral valve regurgitation. No evidence of mitral stenosis. 7. The tricuspid valve is normal in structure. Tricuspid valve regurgitation is mild. 8. The aortic valve is tricuspid Aortic valve regurgitation is trivial by color flow Doppler. Mild aortic valve sclerosis without stenosis. 9. The pulmonic valve was normal in structure. Pulmonic valve regurgitation is trivial by color flow Doppler. 10. Severely elevated pulmonary artery systolic pressure. 11. The inferior vena cava is normal in size with greater than 50% respiratory variability, suggesting right atrial pressure of 3 mmHg.  FINDINGS Left Ventricle: Left ventricular ejection fraction, by visual estimation, is 60 to 65%. The left ventricle has normal function. There is mildly increased left ventricular hypertrophy. Normal left ventricular size. Spectral Doppler shows Left ventricular diastolic Doppler parameters are consistent with impaired relaxation pattern of LV diastolic filling.  Right Ventricle: The right ventricular size is normal. No increase in right ventricular wall thickness. Global RV systolic function is has normal systolic function. The tricuspid regurgitant velocity is 4.46 m/s, and with an assumed right atrial pressure of 10 mmHg, the estimated right ventricular systolic pressure is severely elevated at 89.6 mmHg.  Left Atrium: Left atrial size was normal in size.  Right Atrium: Right atrial size was normal in size  Pericardium: There is no evidence of pericardial effusion.  Mitral Valve: The mitral valve is normal in structure. No evidence of mitral valve stenosis by observation. No evidence of mitral valve regurgitation.  Tricuspid Valve: The tricuspid valve is normal in structure. Tricuspid valve regurgitation is mild by  color flow Doppler.  Aortic Valve: The aortic valve is tricuspid. Aortic valve regurgitation is trivial by color flow Doppler. Mild aortic valve sclerosis is present, with no evidence of aortic valve stenosis.  Pulmonic Valve: The pulmonic valve was normal in structure. Pulmonic valve regurgitation is trivial by color flow Doppler.  Aorta: The aortic root, ascending aorta and aortic arch are  all structurally normal, with no evidence of dilitation or obstruction.  Venous: The inferior vena cava is normal in size with greater than 50% respiratory variability, suggesting right atrial pressure of 3 mmHg.  IAS/Shunts: No atrial level shunt detected by color flow Doppler. No ventricular septal defect is seen or detected. There is no evidence of an atrial septal defect.    LEFT VENTRICLE PLAX 2D LVIDd: 4.30 cm Diastology LVIDs: 2.90 cm LV e' lateral: 0.12 cm/s LV PW: 1.30 cm LV E/e' lateral: 7.0 LV IVS: 1.30 cm LV e' medial: 0.08 cm/s LVOT diam: 2.00 cm LV E/e' medial: 11.1 LV SV: 51 ml LV SV Index: 23.05 LVOT Area: 3.14 cm   RIGHT VENTRICLE RV Basal diam: 2.60 cm RV S prime: 12.80 cm/s TAPSE (M-mode): 2.5 cm  LEFT ATRIUM Index LA diam: 3.50 cm 1.65 cm/m LA Vol (A2C): 53.7 ml 25.38 ml/m LA Vol (A4C): 46.1 ml 21.79 ml/m LA Biplane Vol: 51.8 ml 24.48 ml/m AORTIC VALVE LVOT Vmax: 99.90 cm/s LVOT Vmean: 71.200 cm/s LVOT VTI: 0.227 m  AORTA Ao Root diam: 3.30 cm  MITRAL VALVE TRICUSPID VALVE MV Area (PHT): 3.85 cm TR Peak grad: 79.6 mmHg MV PHT: 57.13 msec TR Vmax: 451.00 cm/s MV Decel Time: 197 msec MV E velocity: 0.84 cm/s 103 cm/s SHUNTS MV A velocity: 109.00 cm/s 70.3 cm/s Systemic VTI: 0.23 m MV E/A ratio: 0.01 1.5 Systemic Diam: 2.00 cm  Subjective: Seen and examined at bedside  was doing well.  Denies chest pain, headedness or dizziness.  No nausea or vomiting and renal functions are improving and respiratory status is stable he did not desaturate on ambulation and is breathing well and is on room air.  Understands he needs to follow-up with urology, nephrology as well as PCP within 1 to 2 weeks.  He is deemed stable for discharge and will discuss with PCP about anticoagulation going forward as he was given a free 30-day card of Eliquis.  Discharge Exam: Vitals:   03/29/19 0758 03/29/19 1153  BP: (!) 179/96 (!) 158/89  Pulse: 68 (!) 56  Resp: 16 18  Temp: 98.9 F (37.2 C) 98.5 F (36.9 C)  SpO2: 94% 96%   Vitals:   03/28/19 2117 03/29/19 0636 03/29/19 0758 03/29/19 1153  BP: (!) 172/89 (!) 173/90 (!) 179/96 (!) 158/89  Pulse: 65 61 68 (!) 56  Resp: 18 16 16 18   Temp: 99 F (37.2 C) 99 F (37.2 C) 98.9 F (37.2 C) 98.5 F (36.9 C)  TempSrc: Oral Oral Oral Oral  SpO2: 93% 99% 94% 96%  Weight:      Height:       General: Pt is alert, awake, not in acute distress Cardiovascular: RRR, S1/S2 +, no rubs, no gallops Respiratory: Diminished bilaterally, no wheezing, no rhonchi; Unlabored breathing Abdominal: Soft, NT, Distended, bowel sounds + Extremities: 1+ LE bilaterally edema, no cyanosis  The results of significant diagnostics from this hospitalization (including imaging, microbiology, ancillary and laboratory) are listed below for reference.    Microbiology: Recent Results (from the past 240 hour(s))  SARS Coronavirus 2 [facility] order, Performed in [facility] hospital lab) Nasopharyngeal Nasopharyngeal Swab     Status: None   Collection Time: 03/24/19  4:08 PM   Specimen: Nasopharyngeal Swab  Result Value Ref Range Status   SARS Coronavirus 2 NEGATIVE NEGATIVE Final    Comment: (NOTE) If result is NEGATIVE SARS-CoV-2 target nucleic acids are NOT DETECTED. The SARS-CoV-2 RNA is generally detectable in upper and lower  respiratory specimens  during the acute phase of infection. The lowest  concentration of SARS-CoV-2 viral copies this assay can detect is 250  copies / mL. A negative result does not preclude SARS-CoV-2 infection  and should not be used as the sole basis for treatment or other  patient management decisions.  A negative result may occur with  improper specimen collection / handling, submission of specimen other  than nasopharyngeal swab, presence of viral mutation(s) within the  areas targeted by this assay, and inadequate number of viral copies  (<250 copies / mL). A negative result must be combined with clinical  observations, patient history, and epidemiological information. If result is POSITIVE SARS-CoV-2 target nucleic acids are DETECTED. The SARS-CoV-2 RNA is generally detectable in upper and lower  respiratory specimens dur ing the acute phase of infection.  Positive  results are indicative of active infection with SARS-CoV-2.  Clinical  correlation with patient history and other diagnostic information is  necessary to determine patient infection status.  Positive results do  not rule out bacterial infection or co-infection with other viruses. If result is PRESUMPTIVE POSTIVE SARS-CoV-2 nucleic acids MAY BE PRESENT.   A presumptive positive result was obtained on the submitted specimen  and confirmed on repeat testing.  While 2019 novel coronavirus  (SARS-CoV-2) nucleic acids may be present in the submitted sample  additional confirmatory testing may be necessary for epidemiological  and / or clinical management purposes  to differentiate between  SARS-CoV-2 and other Sarbecovirus currently known to infect humans.  If clinically indicated additional testing with an alternate test  methodology [phone]) is advised. The SARS-CoV-2 RNA is generally  detectable in upper and lower respiratory sp ecimens during the acute  phase of infection. The expected result is Negative. Fact Sheet for Patients:   StrictlyIdeas.no Fact Sheet for Healthcare Providers: BankingDealers.co.za This test is not yet approved or cleared by the [place] FDA and has been authorized for detection and/or diagnosis of SARS-CoV-2 by FDA under an Emergency Use Authorization (EUA).  This EUA will remain in effect (meaning this test can be used) for the duration of the COVID-19 declaration under Section 564(b)(1) of the Act, 21 U.S.C. section 360bbb-3(b)(1), unless the authorization is terminated or revoked sooner. Performed at [facility], [place] [address] [place], [place] [address]   Blood culture (routine x 2)     Status: None   Collection Time: 03/24/19  9:15 PM   Specimen: BLOOD  Result Value Ref Range Status   Specimen Description   Final    BLOOD RIGHT ANTECUBITAL Performed at [place] [address] [place], [place] [address]    Special Requests   Final    BOTTLES DRAWN AEROBIC AND ANAEROBIC Blood Culture adequate volume Performed at [place] [address] [place], [place] [address]    Culture   Final    NO GROWTH 5 DAYS Performed at [facility], [place] [address] [place], [place] [address]    Report Status 03/29/2019 FINAL  Final  Blood culture (routine x 2)     Status: None (Preliminary result)   Collection Time: 03/25/19 12:48 AM   Specimen: BLOOD RIGHT HAND  Result Value Ref Range Status   Specimen Description   Final    BLOOD RIGHT HAND Performed at [facility], [place] [address] [place], [place] [address]    Special Requests   Final    BOTTLES DRAWN AEROBIC ONLY Blood Culture  adequate volume Performed at [place] [address], [place], [place] [address]    Culture   Final    NO GROWTH 4 DAYS Performed at [facility], [place] [address], [place], [place] [address]    Report Status PENDING  Incomplete  MRSA PCR Screening      Status: None   Collection Time: 03/25/19  1:31 PM   Specimen: Nasal Mucosa; Nasopharyngeal  Result Value Ref Range Status   MRSA by PCR NEGATIVE NEGATIVE Final    Comment:        The GeneXpert MRSA Assay (FDA approved for NASAL specimens only), is one component of a comprehensive MRSA colonization surveillance program. It is not intended to diagnose MRSA infection nor to guide or monitor treatment for MRSA infections. Performed at [facility], [place] [address], [place], [place] [address]      Labs: BNP (last 3 results) Recent Labs    03/24/19 1608  BNP 258.5*   Basic Metabolic Panel: Recent Labs  Lab 03/25/19 0048 03/26/19 0428 03/27/19 0040 03/28/19 0505 03/28/19 0508 03/29/19 0555  NA 141 140 140 138  --  139  K 5.1 5.1 4.8 4.2  --  4.1  CL 112* 115* 115* 108  --  104  CO2 15* 16* 16* 20*  --  27  GLUCOSE 135* 116* 103* 158*  --  175*  BUN 35* 32* 29* 25*  --  22  CREATININE 2.96* 2.58* 2.57* 2.64* 2.66* 2.16*  CALCIUM 9.2 8.7* 8.7* 9.0  --  8.9  MG  --  1.7 1.7 1.9  --  1.6*  PHOS  --   --  2.9 3.1  --  2.5   Liver Function Tests: Recent Labs  Lab 03/24/19 1752 03/25/19 0048 03/27/19 0040 03/28/19 0505 03/29/19 0555  AST 10* 13* 12* 14* 17  ALT 14 17 14 14 19   ALKPHOS 46 59 49 50 47  BILITOT 0.8 0.8 0.7 0.6 0.4  PROT 5.9* 6.9 5.8* 5.9* 5.9*  ALBUMIN 2.7* 3.2* 2.5* 2.5* 2.5*   No results for input(s): LIPASE, AMYLASE in the last 168 hours. No results for input(s): AMMONIA in the last 168 hours. CBC: Recent Labs  Lab 03/24/19 1607 03/26/19 0428 03/27/19 0040 03/28/19 0508 03/29/19 0555  WBC 8.0 7.8 7.1 6.8 7.1  NEUTROABS 6.5  --  5.2  --  5.1  HGB 12.3* 10.3* 9.8* 10.3* 10.0*  HCT 40.8 34.8* 31.6* 33.1* 32.4*  MCV 88.3 89.5 87.5 87.3 85.7  PLT 256 239 228 281 290   Cardiac Enzymes: No results for input(s): CKTOTAL, CKMB, CKMBINDEX, TROPONINI in the last 168 hours. BNP: Invalid input(s): POCBNP CBG: Recent Labs  Lab  03/28/19 1123 03/28/19 1624 03/28/19 2122 03/29/19 0837 03/29/19 1152  GLUCAP 187* 195* 125* 138* 154*   D-Dimer No results for input(s): DDIMER in the last 72 hours. Hgb A1c No results for input(s): HGBA1C in the last 72 hours. Lipid Profile No results for input(s): CHOL, HDL, LDLCALC, TRIG, CHOLHDL, LDLDIRECT in the last 72 hours. Thyroid function studies No results for input(s): TSH, T4TOTAL, T3FREE, THYROIDAB in the last 72 hours.  Invalid input(s): FREET3 Anemia work up Recent Labs    03/28/19 0508  VITAMINB12 206  FOLATE 5.4*  FERRITIN 211  TIBC 216*  IRON 29*  RETICCTPCT 1.2   Urinalysis    Component Value Date/Time   COLORURINE YELLOW 03/24/2019 1917   APPEARANCEUR HAZY (A) 03/24/2019 1917   LABSPEC 1.020  03/24/2019 [place] 5.0 03/24/2019 [place] 03/24/2019 [place] 03/06/2019 1041   HGBUR SMALL (A) 03/24/2019 [place] 03/24/2019 1917   BILIRUBINUR Neg 01/05/2015 0944   KETONESUR 5 (A) 03/24/2019 1917   PROTEINUR 100 (A) 03/24/2019 1917   UROBILINOGEN 0.2 03/06/2019 1041   NITRITE NEGATIVE 03/24/2019 1917   LEUKOCYTESUR NEGATIVE 03/24/2019 1917   Sepsis Labs Invalid input(s): PROCALCITONIN,  WBC,  LACTICIDVEN Microbiology Recent Results (from the past 240 hour(s))  SARS Coronavirus 2 [facility] order, Performed in [facility] hospital lab) Nasopharyngeal Nasopharyngeal Swab     Status: None   Collection Time: 03/24/19  4:08 PM   Specimen: Nasopharyngeal Swab  Result Value Ref Range Status   SARS Coronavirus 2 NEGATIVE NEGATIVE Final    Comment: (NOTE) If result is NEGATIVE SARS-CoV-2 target nucleic acids are NOT DETECTED. The SARS-CoV-2 RNA is generally detectable in upper and lower  respiratory specimens during the acute phase of infection. The lowest  concentration of SARS-CoV-2 viral copies this assay can detect is 250  copies / mL. A negative result does not preclude SARS-CoV-2  infection  and should not be used as the sole basis for treatment or other  patient management decisions.  A negative result may occur with  improper specimen collection / handling, submission of specimen other  than nasopharyngeal swab, presence of viral mutation(s) within the  areas targeted by this assay, and inadequate number of viral copies  (<250 copies / mL). A negative result must be combined with clinical  observations, patient history, and epidemiological information. If result is POSITIVE SARS-CoV-2 target nucleic acids are DETECTED. The SARS-CoV-2 RNA is generally detectable in upper and lower  respiratory specimens dur ing the acute phase of infection.  Positive  results are indicative of active infection with SARS-CoV-2.  Clinical  correlation with patient history and other diagnostic information is  necessary to determine patient infection status.  Positive results do  not rule out bacterial infection or co-infection with other viruses. If result is PRESUMPTIVE POSTIVE SARS-CoV-2 nucleic acids MAY BE PRESENT.   A presumptive positive result was obtained on the submitted specimen  and confirmed on repeat testing.  While 2019 novel coronavirus  (SARS-CoV-2) nucleic acids may be present in the submitted sample  additional confirmatory testing may be necessary for epidemiological  and / or clinical management purposes  to differentiate between  SARS-CoV-2 and other Sarbecovirus currently known to infect humans.  If clinically indicated additional testing with an alternate test  methodology [phone]) is advised. The SARS-CoV-2 RNA is generally  detectable in upper and lower respiratory sp ecimens during the acute  phase of infection. The expected result is Negative. Fact Sheet for Patients:  StrictlyIdeas.no Fact Sheet for Healthcare Providers: BankingDealers.co.za This test is not yet approved or cleared by the [place]  FDA and has been authorized for detection and/or diagnosis of SARS-CoV-2 by FDA under an Emergency Use Authorization (EUA).  This EUA will remain in effect (meaning this test can be used) for the duration of the COVID-19 declaration under Section 564(b)(1) of the Act, 21 U.S.C. section 360bbb-3(b)(1), unless the authorization is terminated or revoked sooner. Performed at [facility], [place] [address], [place], [place] [address]   Blood culture (routine x 2)     Status: None   Collection Time: 03/24/19  9:15 PM   Specimen: BLOOD  Result Value Ref Range Status   Specimen Description  Final    BLOOD RIGHT ANTECUBITAL Performed at [place] [address], [place], [place] [address]    Special Requests   Final    BOTTLES DRAWN AEROBIC AND ANAEROBIC Blood Culture adequate volume Performed at [place] [address], [place], [place] [address]    Culture   Final    NO GROWTH 5 DAYS Performed at [facility], [place] [address] [place], [place] [address]    Report Status 03/29/2019 FINAL  Final  Blood culture (routine x 2)     Status: None (Preliminary result)   Collection Time: 03/25/19 12:48 AM   Specimen: BLOOD RIGHT HAND  Result Value Ref Range Status   Specimen Description   Final    BLOOD RIGHT HAND Performed at [facility], [place] [address], [place], [place] [address]    Special Requests   Final    BOTTLES DRAWN AEROBIC ONLY Blood Culture adequate volume Performed at [place] [address] [place], [place] [address]    Culture   Final    NO GROWTH 4 DAYS Performed at [facility], [place] [address] [place], [place] [address]    Report Status PENDING  Incomplete  MRSA PCR Screening     Status: None   Collection Time: 03/25/19  1:31 PM   Specimen: Nasal Mucosa; Nasopharyngeal  Result Value Ref Range Status   MRSA by PCR NEGATIVE NEGATIVE Final    Comment:        The  GeneXpert MRSA Assay (FDA approved for NASAL specimens only), is one component of a comprehensive MRSA colonization surveillance program. It is not intended to diagnose MRSA infection nor to guide or monitor treatment for MRSA infections. Performed at [facility], [place] [address], [place],  [address]    Time coordinating discharge: 35 minutes  SIGNED:  Kerney Elbe, DO [facility] 03/29/2019, 12:11 PM Pager is on [place]  If 7PM-7AM, please contact night-coverage [email or web address] Password [email or web address]

## 2019-03-29 NOTE — Progress Notes (Signed)
Patient ID: Noah Barnes, male   DOB: 1945/09/09, 73 y.o.   MRN: [id number]    Subjective: Pt doing well.  Mild hematuria.  Denies pain.   No fever.   Objective: Vital signs in last 24 hours: Temp:  [97.7 F (36.5 C)-99 F (37.2 C)] 98.9 F (37.2 C) (09/26 0758) Pulse Rate:  [61-91] 68 (09/26 0758) Resp:  [16-18] 16 (09/26 0758) BP: (115-193)/(77-96) 179/96 (09/26 0758) SpO2:  [89 %-99 %] 94 % (09/26 0758)  Intake/Output from previous day: 09/25 0701 - 09/26 0700 In: 1424.7 [P.O.:360; I.V.:1064.7] Out: -  Intake/Output this shift: No intake/output data recorded.  Physical Exam:  General: Alert and oriented Abdomen: Soft, ND, Mild L CVAT   Lab Results: Recent Labs    03/27/19 0040 03/28/19 0508 03/29/19 0555  HGB 9.8* 10.3* 10.0*  HCT 31.6* 33.1* 32.4*   CBC Latest Ref Rng & Units 03/29/2019 03/28/2019 03/27/2019  WBC 4.0 - 10.5 K/uL 7.1 6.8 7.1  Hemoglobin 13.0 - 17.0 g/dL 10.0(L) 10.3(L) 9.8(L)  Hematocrit 39.0 - 52.0 % 32.4(L) 33.1(L) 31.6(L)  Platelets 150 - 400 K/uL 290 281 228     BMET Recent Labs    03/28/19 0505 03/28/19 0508 03/29/19 0555  NA 138  --  139  K 4.2  --  4.1  CL 108  --  104  CO2 20*  --  27  GLUCOSE 158*  --  175*  BUN 25*  --  22  CREATININE 2.64* 2.66* 2.16*  CALCIUM 9.0  --  8.9     Studies/Results: US Renal  Result Date: 03/27/2019 CLINICAL DATA:  Acute kidney injury. Technologist notes state lithotripsy on the right 1 week ago. EXAM: RENAL / URINARY TRACT ULTRASOUND COMPLETE COMPARISON:  Noncontrast CT 03/24/2019 FINDINGS: Right Kidney: Renal measurements: 11.1 x 5.5 x 5.3 cm = volume: 168 mL. 2.2 cm simple cyst in the upper right kidney. Small stone lower right kidney on prior CT is not resolved sonographically. Echogenicity within normal limits. No solid mass or hydronephrosis visualized. Left Kidney: Renal measurements: 12.4 x 6.6 x 5.4 cm = volume: 232 mL. Mild hydronephrosis. 14 mm shadowing calculus in the lower left  kidney. The proximal ureteral calculi on prior CT are not definitively seen. Parapelvic cyst measuring 19 mm. No solid mass. Bladder: Appears normal for degree of bladder distention. IMPRESSION: 1. Mild left hydronephrosis, also seen on CT 3 days ago. The previous proximal left ureteral calculi are not visualized sonographically. Nonobstructing stone in the lower left kidney. 2. No right hydronephrosis. Nonobstructing right renal stone on CT is not visualized. 3. Bilateral renal cysts. Electronically Signed   By: Keith Rake M.D.   On: 03/27/2019 22:06    Assessment/Plan: S/P L ESWL on anticoagulation for PE/DVT:  Renal function improving (baseline Cr 1.4-1.8). No severe pain.  Continue to strain urine and will start alpha blocker therapy to help facilitate stone passage.  He should keep appt on Oct 1 with [facility] for follow up if he is discharged home.   LOS: 5 days   Dutch Gray 03/29/2019, 8:06 AM

## 2019-03-29 NOTE — Progress Notes (Signed)
Pharmacy Antibiotic Note  Noah Barnes is a 73 y.o. male admitted on 03/24/2019 with pneumonia.  Pharmacy has been consulted for Cefepime dosing. Vancomycin and Levofloxacin have been discontinued   Plan: D5 full abx Adjust Cefepime to 2g IV q12h for improved renal function  Monitor renal function, cultures, clinical course  Height: 5\' 8"  (172.7 cm) Weight: 213 lb (96.6 kg) IBW/kg (Calculated) : 68.4  Temp (24hrs), Avg:98.5 F (36.9 C), Min:97.7 F (36.5 C), Max:99 F (37.2 C)  Recent Labs  Lab 03/24/19 1607  03/26/19 0428 03/27/19 0040 03/28/19 0505 03/28/19 0508 03/29/19 0555  WBC 8.0  --  7.8 7.1  --  6.8 7.1  CREATININE 3.15*   < > 2.58* 2.57* 2.64* 2.66* 2.16*   < > = values in this interval not displayed.    Estimated Creatinine Clearance: 34.8 mL/min (A) (by C-G formula based on SCr of 2.16 mg/dL (H)).    Allergies  Allergen Reactions  . Ace Inhibitors Other (See Comments)    Severe AKI due to ARB + dehydration Aug 2016  . Angiotensin Receptor Blockers Other (See Comments)    Severe AKI due to ARB + dehydration Aug 2016    Antimicrobials this admission: 9/21 Vancomycin>> 9/23 9/21 Cefepime>> 9/21 Azithromycin x 1 9/22 LVQ >> 9/22  Microbiology results: 9/21 COVID: negative  9/21 BCx: NGF 9/22 BCx: NGTD 9/22 MRSA PCR: negative  9/22 HIV: non reactive   Thank you for allowing pharmacy to be a part of this patient's care.    Lindell Spar, PharmD, BCPS Clinical Pharmacist  03/29/2019 9:34 AM

## 2019-03-29 NOTE — Progress Notes (Signed)
[facility] for IV Heparin --> PO Apixaban Indication: PE/DVT  Allergies  Allergen Reactions  . Ace Inhibitors Other (See Comments)    Severe AKI due to ARB + dehydration Aug 2016  . Angiotensin Receptor Blockers Other (See Comments)    Severe AKI due to ARB + dehydration Aug 2016    Patient Measurements: Height: 5\' 8"  (172.7 cm) Weight: 213 lb (96.6 kg) IBW/kg (Calculated) : 68.4 Heparin Dosing Weight: 89.4 kg  Vital Signs: Temp: 98.9 F (37.2 C) (09/26 0758) Temp Source: Oral (09/26 0758) BP: 179/96 (09/26 0758) Pulse Rate: 68 (09/26 0758)  Labs: Recent Labs    03/26/19 1515  03/26/19 1707  03/27/19 0040 03/27/19 0906 03/28/19 0505 03/28/19 0508 03/29/19 0555  HGB  --   --   --    < > 9.8*  --   --  10.3* 10.0*  HCT  --   --   --   --  31.6*  --   --  33.1* 32.4*  PLT  --   --   --   --  228  --   --  281 290  HEPARINUNFRC  --    < > 0.64  --  0.65 0.64  --  0.51 0.34  CREATININE  --   --   --    < > 2.57*  --  2.64* 2.66* 2.16*  TROPONINIHS 31*  --  34*  --   --   --   --   --   --    < > = values in this interval not displayed.    Estimated Creatinine Clearance: 34.8 mL/min (A) (by C-G formula based on SCr of 2.16 mg/dL (H)).   Medical History: Past Medical History:  Diagnosis Date  . CHF (congestive heart failure) ([place])   . Chronic diastolic heart failure ([place])   . Constipation   . Diabetes mellitus   . Diverticulosis   . ED (erectile dysfunction)   . Gout   . Heart murmur   . Hyperlipidemia   . Hypertension   . IBS (irritable bowel syndrome)   . Renal calculus   . Renal insufficiency   . Tubular adenoma of colon     Assessment: 20 y/oM admitted on 03/24/2019 for right lower lobe pneumonia. D-dimer on admission elevated at 11.94. Unable to obtain CTPA due to CKD3. VQ scan with intermediate probability for PE. Pharmacy consulted for IV heparin dosing. Lower extremity dopplers also + for bilateral DVT. Patient  not on any anticoagulants PTA. He was on SQ heparin for VTE prophylaxis during this admission, which has been discontinued.   Today, 03/29/19:  AM Heparin level = 0.34 units/mL, remains therapeutic  CBC: Hgb low/stable at 10, Pltc WNL  SCr improving, 2.16 today   Mild hematuria, but no other bleeding noted per RN  Asked to transition to Apixaban today per MD  Goal of Therapy:  Heparin level 0.3-0.7 units/ml Monitor platelets by anticoagulation protocol: Yes   Plan:   Discontinue heparin infusion at 1000.  At the same time, start Apixaban 10mg  PO BID x 7 days, then 5mg  PO BID thereafter.   Monitor CBC and for s/sx of bleeding  Pharmacy to provide education prior to discharge.    Lindell Spar, PharmD, BCPS Clinical Pharmacist  03/29/2019,9:02 AM

## 2019-03-30 LAB — CULTURE, BLOOD (ROUTINE X 2)
Culture: NO GROWTH
Special Requests: ADEQUATE

## 2019-03-31 ENCOUNTER — Telehealth: Payer: Self-pay | Admitting: *Deleted

## 2019-03-31 ENCOUNTER — Telehealth: Payer: Self-pay | Admitting: Hematology

## 2019-03-31 NOTE — Telephone Encounter (Signed)
Called and spoke with patient regarding appointment that was scheduled due to referral we received.  I also advised him that I would mail a copy of the schedule along w/ letter & map of our facility

## 2019-03-31 NOTE — Telephone Encounter (Signed)
Transition Care Management Follow-up Telephone Call   Date discharged? 03/29/19   How have you been since you were released from the hospital? Pt states he is doing alright   Do you understand why you were in the hospital? YES   Do you understand the discharge instructions? YES   Where were you discharged to? Home   Items Reviewed:  Medications reviewed: YES, pt states they change instructions on some of his medications and want to sit down w/dr. Quay Burow to discuss. Currently taking antibiotic but will be finish on wednesday  Allergies reviewed: YES  Dietary changes reviewed: YES, heart healthy  Referrals reviewed: No referral recommeded   Functional Questionnaire:   Activities of Daily Living (ADLs):   He states he are independent in the following: ambulation, bathing and hygiene, feeding, continence, grooming, toileting and dressing States he doesn't require assistance    Any transportation issues/concerns?: NO   Any patient concerns? NO   Confirmed importance and date/time of follow-up visits scheduled YES, appt 04/04/19  Provider Appointment booked with Dr. Quay Burow  Confirmed with patient if condition begins to worsen call PCP or go to the ER.  Patient was given the office number and encouraged to call back with question or concerns.  : YES

## 2019-04-02 ENCOUNTER — Ambulatory Visit (INDEPENDENT_AMBULATORY_CARE_PROVIDER_SITE_OTHER): Payer: Medicare Other | Admitting: Physician Assistant

## 2019-04-02 ENCOUNTER — Encounter: Payer: Self-pay | Admitting: Physician Assistant

## 2019-04-02 VITALS — BP 140/80 | HR 56 | Temp 97.9°F | Ht 68.0 in | Wt 219.4 lb

## 2019-04-02 DIAGNOSIS — K581 Irritable bowel syndrome with constipation: Secondary | ICD-10-CM

## 2019-04-02 DIAGNOSIS — Z86010 Personal history of colonic polyps: Secondary | ICD-10-CM

## 2019-04-02 NOTE — Progress Notes (Signed)
Chief Complaint: Chronic constipation  HPI:    Noah Barnes is a 73 year old African-American male with past medical history as listed below, known to Dr. Hilarie Fredrickson with a history of IBS-C, who returns to clinic today for follow-up of his IBS-C.     05/14/2018 patient seen by Dr. Hilarie Fredrickson.  At that time was maintained on Linzess 290 mcg daily for which the most part works.  He required bowel purge with colonoscopy prep every 1 to 2 months.  Patient was set for recall colonoscopy in September 2020.  He was changed to Amitiza 24 mcg twice daily.  Discussed if this did not help he go back to Linzess 290 mcg with occasional bowel purge first trying Motegrity.    03/24/2019-926/20 patient was admitted to the hospital for pneumonia.  Also developed PE with bilateral DVTs and was started on Eliquis.    Today, the patient tells me that he tried the Amitiza but it did not work at all so he went back to [employer] 290 mcg daily with an occasional bowel purge "whenever I can get it".  He tells me this is about once a month.  He does pick up samples from our office.  He has tried MiraLAX prep in the past which does not work for him.  Today tells me that his father had the same issue and "eventually died from it".  Tells me that his "bowels just stopped working".    Tells me that "I think we should wait a while for my colonoscopy given that I was just in the hospital".    Denies fever, chills, abdominal pain, blood in his stools or weight loss.  Past Medical History:  Diagnosis Date  . CHF (congestive heart failure) ([place])   . Chronic diastolic heart failure ([place])   . Constipation   . Diabetes mellitus   . Diverticulosis   . ED (erectile dysfunction)   . Gout   . Heart murmur   . Hyperlipidemia   . Hypertension   . IBS (irritable bowel syndrome)   . Renal calculus   . Renal insufficiency   . Tubular adenoma of colon     Past Surgical History:  Procedure Laterality Date  . COLONOSCOPY  1998   negative; no F/U   . EXTRACORPOREAL SHOCK WAVE LITHOTRIPSY Left 03/20/2019   Procedure: EXTRACORPOREAL SHOCK WAVE LITHOTRIPSY (ESWL);  Surgeon: Cleon Gustin, MD;  Location: [facility];  Service: Urology;  Laterality: Left;  . knee effusion tapped      Current Outpatient Medications  Medication Sig Dispense Refill  . allopurinol (ZYLOPRIM) 300 MG tablet Take 1 tablet by mouth once daily (Patient taking differently: Take 300 mg by mouth daily. ) 90 tablet 1  . apixaban (ELIQUIS) 5 MG TABS tablet Take 10 mg (two 5 mg tabs) twice a day for 7 days and then 5 mg po BID after 70 tablet 0  . aspirin EC 81 MG tablet Take 81 mg by mouth every morning.    [name] atorvastatin (LIPITOR) 20 MG tablet Take 1 tablet by mouth once daily (Patient taking differently: Take 20 mg by mouth daily. ) 90 tablet 0  . cefdinir (OMNICEF) 300 MG capsule Take 1 capsule (300 mg total) by mouth every 12 (twelve) hours. 5 capsule 0  . cloNIDine (CATAPRES) 0.1 MG tablet Take 1 tablet (0.1 mg total) by mouth 2 (two) times daily. Follow-up appt is due in Sept must see provider for future refills 180 tablet 0  . furosemide (LASIX)  20 MG tablet TAKE 1 TO 2 TABLETS BY MOUTH ONCE DAILY AS NEEDED FOR  FLUID  EDEMA (Patient not taking: No sig reported) 90 tablet 3  . glimepiride (AMARYL) 2 MG tablet TAKE 1 TABLET BY MOUTH ONCE DAILY BEFORE BREAKFAST (Patient taking differently: Take 2 mg by mouth daily with breakfast. ) 30 tablet 0  . hydrALAZINE (APRESOLINE) 50 MG tablet Take 1 tablet (50 mg total) by mouth 2 (two) times daily. 60 tablet 0  . iron polysaccharides (NIFEREX) 150 MG capsule Take 1 capsule (150 mg total) by mouth daily. 30 capsule 0  . Lancets (ONETOUCH DELICA PLUS [id number]) MISC USE AS DIRECTED TWICE A DAILY (Patient taking differently: 1 application by Other route 2 (two) times daily. ) 100 each 0  . LINZESS 290 MCG CAPS capsule TAKE 1 CAPSULE BY MOUTH ONCE DAILY BEFORE BREAKFAST (Patient taking differently: Take 290 mcg by mouth daily before  breakfast. ) 30 capsule 1  . metoprolol tartrate (LOPRESSOR) 100 MG tablet Take 1 tablet by mouth twice daily (Patient taking differently: Take 100 mg by mouth 2 (two) times daily. ) 180 tablet 0  . ONETOUCH ULTRA test strip USE TO TEST TWICE DAILY (Patient taking differently: 1 each by Other route 2 (two) times daily. ) 100 strip 3  . oxyCODONE-acetaminophen (PERCOCET) 5-325 MG tablet Take 1 tablet by mouth every 4 (four) hours as needed for moderate pain or severe pain. 30 tablet 0  . sildenafil (VIAGRA) 100 MG tablet TAKE AS DIRECTED (Patient taking differently: Take 100 mg by mouth as needed for erectile dysfunction. ) 6 tablet 1  . sitaGLIPtin (JANUVIA) 50 MG tablet Take 1 tablet (50 mg total) by mouth daily. 30 tablet 0  . tamsulosin (FLOMAX) 0.4 MG CAPS capsule Take 1 capsule (0.4 mg total) by mouth daily after supper. 30 capsule 0  . traMADol (ULTRAM) 50 MG tablet Take 50-100 mg by mouth every 6 (six) hours as needed.     No current facility-administered medications for this visit.     Allergies as of 04/02/2019 - Review Complete 03/25/2019  Allergen Reaction Noted  . Ace inhibitors Other (See Comments) 02/26/2015  . Angiotensin receptor blockers Other (See Comments) 02/26/2015    Family History  Problem Relation Age of Onset  . Heart attack Mother 10  . Hypertension Mother   . Prostate cancer Father 72  . Diabetes Father   . Irritable bowel syndrome Father   . Colon cancer Brother        dx in his late 75's  . Irritable bowel syndrome Sister        x 2  . Irritable bowel syndrome Brother   . Stroke Neg Hx   . Esophageal cancer Neg Hx   . Rectal cancer Neg Hx   . Stomach cancer Neg Hx     Social History   Socioeconomic History  . Marital status: Married    Spouse name: Not on file  . Number of children: 1  . Years of education: Not on file  . Highest education level: Not on file  Occupational History  . Occupation: Metallurgist: [employer]: [phone]  Social Needs  . Financial resource strain: Not hard at all  . Food insecurity    Worry: Never true    Inability: Never true  . Transportation needs    Medical: Yes    Non-medical: Yes  Tobacco Use  . Smoking  status: Former Smoker    Packs/day: 0.30    Types: Cigarettes    Quit date: 07/04/1991    Years since quitting: 27.7  . Smokeless tobacco: Never Used  . Tobacco comment: smoked age [age], up to 1/3 ppd; may be less  Substance and Sexual Activity  . Alcohol use: Yes    Alcohol/week: 2.0 standard drinks    Types: 2 Glasses of wine per week    Comment: rare  . Drug use: No  . Sexual activity: Yes  Lifestyle  . Physical activity    Days per week: 0 days    Minutes per session: 0 min  . Stress: Not at all  Relationships  . Social connections    Talks on phone: More than three times a week    Gets together: More than three times a week    Attends religious service: More than 4 times per year    Active member of club or organization: Not on file    Attends meetings of clubs or organizations: More than 4 times per year    Relationship status: Married  . Intimate partner violence    Fear of current or ex partner: No    Emotionally abused: No    Physically abused: No    Forced sexual activity: No  Other Topics Concern  . Not on file  Social History Narrative  . Not on file    Review of Systems:    Constitutional: No weight loss, fever or chills Cardiovascular: No chest pain Respiratory: No SOB  Gastrointestinal: See HPI and otherwise negative   Physical Exam:  Vital signs: BP 140/80   Pulse (!) 56   Temp 97.9 F (36.6 C)   Ht 5\' 8"  (1.727 m)   Wt 219 lb 6.4 oz (99.5 kg)   BMI 33.36 kg/m   Constitutional:   Pleasant AA male appears to be in NAD, Well developed, Well nourished, alert and cooperative Respiratory: Respirations even and unlabored. Lungs clear to auscultation bilaterally.   No wheezes, crackles, or rhonchi.   Cardiovascular: Normal S1, S2. No MRG. Regular rate and rhythm. No peripheral edema, cyanosis or pallor.  Gastrointestinal:  Soft, nondistended, nontender. No rebound or guarding. Normal bowel sounds. No appreciable masses or hepatomegaly. Rectal:  Not performed.  Psychiatric: Demonstrates good judgement and reason without abnormal affect or behaviors.  RELEVANT LABS AND IMAGING: CBC    Component Value Date/Time   WBC 7.1 03/29/2019 0555   RBC 3.78 (L) 03/29/2019 0555   HGB 10.0 (L) 03/29/2019 0555   HCT 32.4 (L) 03/29/2019 0555   PLT 290 03/29/2019 0555   MCV 85.7 03/29/2019 0555   MCH 26.5 03/29/2019 0555   MCHC 30.9 03/29/2019 0555   RDW 13.2 03/29/2019 0555   LYMPHSABS 1.0 03/29/2019 0555   MONOABS 0.6 03/29/2019 0555   EOSABS 0.3 03/29/2019 0555   BASOSABS 0.0 03/29/2019 0555    CMP     Component Value Date/Time   NA 139 03/29/2019 0555   K 4.1 03/29/2019 0555   CL 104 03/29/2019 0555   CO2 27 03/29/2019 0555   GLUCOSE 175 (H) 03/29/2019 0555   BUN 22 03/29/2019 0555   CREATININE 2.16 (H) 03/29/2019 0555   CALCIUM 8.9 03/29/2019 0555   PROT 5.9 (L) 03/29/2019 0555   ALBUMIN 2.5 (L) 03/29/2019 0555   AST 17 03/29/2019 0555   ALT 19 03/29/2019 0555   ALKPHOS 47 03/29/2019 0555   BILITOT 0.4 03/29/2019 0555   GFRNONAA 30 (L)  03/29/2019 0555   GFRAA 34 (L) 03/29/2019 0555    Assessment: 1.  Constipation: Chronic for the patient 2.  History of adenomatous polyps: Due for recall colonoscopy 3.  Anticoagulation: With Xarelto for PE diagnosed in the hospital just recently, he will need to be on this for at least 4-6 months per notes  Plan: 1.  Discussed with patient that we could try Movantik, but we do not have samples today.  We will call him when we do. 2.  Provided the patient with a Plenvu bowel prep. 3.  Continue Linzess 290 mcg daily.  Discussed that he can use MiraLAX up to 4 times a day with this.  He said he will try this regimen. 4.  Patient was placed  in recall for colonoscopy in 6 months.  He was also placed in recall for appointment with Dr. Hilarie Fredrickson to discuss prior to scheduling given all of his recent health problems and need for anticoagulation for recent PE. 5.  Patient to call if he needs [place] before then.  Ellouise Newer, PA-C [facility] 04/02/2019, 3:29 PM  Cc: Binnie Rail, MD

## 2019-04-02 NOTE — Patient Instructions (Signed)
We are putting you in our system for an office visit recall for 6 months from now.    We are providing you with a bowel prep to have on hand if you need to use it.   We will call you if we get any movantik samples.   I appreciate the opportunity to care for you. Ellouise Newer, PA-C

## 2019-04-03 ENCOUNTER — Encounter: Payer: Self-pay | Admitting: Internal Medicine

## 2019-04-03 DIAGNOSIS — Z86711 Personal history of pulmonary embolism: Secondary | ICD-10-CM | POA: Insufficient documentation

## 2019-04-03 DIAGNOSIS — I2699 Other pulmonary embolism without acute cor pulmonale: Secondary | ICD-10-CM | POA: Insufficient documentation

## 2019-04-03 DIAGNOSIS — I82403 Acute embolism and thrombosis of unspecified deep veins of lower extremity, bilateral: Secondary | ICD-10-CM | POA: Insufficient documentation

## 2019-04-03 DIAGNOSIS — Z86718 Personal history of other venous thrombosis and embolism: Secondary | ICD-10-CM | POA: Insufficient documentation

## 2019-04-03 NOTE — Progress Notes (Signed)
Subjective:    Patient ID: Noah Barnes, male    DOB: 02-Feb-1946, 73 y.o.   MRN: [id number]  HPI The patient is here for follow up from the hospital    Admitted 9/21 -  03/29/19    Recommendations for Outpatient Follow-up:  1. Follow up with PCP in 1-2 weeks 2. Follow up with Nephrology 3. Follow up with Hematology 4. Follow up with Urology 5. Repeat CT Chest for Lung Nodule in 6-12 months  6. Please obtain BMP/CBC in one week  He had lithotripsy the week prior.   He went to the ED with progressive SOB over the past couple of days.  He also had a cough and low grade fevers.  The cough was not productive.  He had right sided chest pain that was a dull ache.  The pain reached a 6/10.  In the ED CT scan showed RLL PNA.  He had AKI on CKD so CT was done w/o contrast.   His temp was normal.  BP 187/101, hr 87, RR 30, O2 sat 81% on RA, 99% on 2L.  WBC 8, hgb 12.3, BUN 39, CR 3.15 (baseline 1/7).  BNP 204, ddimer 19.  CXR no acute findings.  CT RLL PNA and bilateral nephrolithiasis L > R.  There was a 20 x 4 mm stone within the left ureter with mild left hydronephrosis.    He had a VQ scan that was intermediate probability.  He had LE swelling - lower extremity venous duplex showed b/l DVTs.  He was on heparin drip.      RLL PNA with acute respiratory failure with hypoxia: Initially hypoxic and was on oxygen - improved and was able to be weaned off oxygen, he was not hypoxic on discharge CT showed RLL PNA VQ intermediate, b/l [place] po for DVT Strep pneumo urinary antigen negative Started on Vanco, levqauin and cefepime - transitioned to just cefepime Discharged home to cefepime for 2 more days CXR in 3-6 weeks  Elevated d-dimer with suspected PE in setting of bl DVTs: D-dimer elevated, VQ intermediate, b/l LE US showed DVTs b/l Echo showed no right heart strain Was on heparin gtt then transitioned to eliquis Discussed with oncology - outpatient f/u with oncology recommended for  hypercoagulable work up Weaned off oxygen prior to d/c  AKI on CKD, metabolic acidosis, nephrolithiasis: The week prior to admission had lithotripsy - CT showed b/l nephrolithiasis, L > R with mild left hydronephrosis BUN/Cr trended down Started on IVF with sodium bicarb drip Continued on flomax Had aggressive IVF Pain management F/u with nephrology and urology as an outpatient  Pulmonary nodule, LLL Incidental finding on CT, 5-6 mm LLL CT needed in 6-12 months  Type 2 DM: Metformin d/c'd Continued on glimepiride and januvia a1c 6.3%  HLD: Continued on atorvastatin  Diastolic HF, compensated, hypertension: Continued on clonidine, metoprolol, hydralazine- dose increased to 50 mg   Chronic pain syndrome Continue tramadol Continue oxycodone- acet for mod-severe pain  Normocytic anemia H/H stable, 10/32.4 Now on eliquis - monitor anemia/signs of bleeding  Hypomagnesemia repleted  Repeat level    Nephrolithiasis:  He saw urology yesterday.  Because he is on the blood thinner they want to continue conservative treatment.  He has pain mostly as night when he lays down-the pain is when he lays on the left side, which is where the stone is.  He takes the pain medication only as needed for severe pain.  He is drinking plenty of  water.  DVT b/l LE, PE:  He is taking eliquis.  He has not had any side effects.  In 2 days he will start on 1 pill twice daily.  He does have an appointment with the hematologist scheduled.  He has stable bilateral lower extremity swelling.  He does not wear compression socks.  He has not had any pain in his legs.  He felt short of breath the other day when he had to carry a basket of laundry up the stairs and if he walks long distances, but on his day-to-day activities he does not have any shortness of breath.  Right lower lobe pneumonia: He completed the antibiotics at home.  He denies any coughing, wheezing, fever, chills.  He has shortness of breath  with only moderate exertion.  His appetite is improving.  His energy level is improving.  Left lower lung nodule: He was not aware of the finding of his left lower lung nodule.  He is a former smoker.  He does have the shortness of breath with moderate exertion, but not with daily activities.  He denies any coughing or wheezing.  Diabetes: He is no longer taking metformin.  Since he has been home from the hospital his sugars have been higher.  He is taking his other medication.  He does have an appointment to see Dr. Dwyane Dee this month.  He did have blood work done with urology recently, but is unsure what his kidney function was.  He has been compliant with a low sugar/carbohydrate diet.  Hypertension, hyperlipidemia: He is taking his medication daily as prescribed.   Medications and allergies reviewed with patient and updated if appropriate.  Patient Active Problem List   Diagnosis Date Noted  . DVT of lower extremity, bilateral ([place]) 04/03/2019  . Pulmonary embolism ([place]) 04/03/2019  . Hypomagnesemia 04/03/2019  . HCAP (healthcare-associated pneumonia) 03/24/2019  . Nephrolithiasis 03/06/2019  . Right flank pain 07/03/2018  . Acute right-sided low back pain without sciatica 07/03/2018  . Seasonal allergic rhinitis 05/22/2018  . Paronychia of great toe of left foot 05/22/2018  . MR (mitral regurgitation) 12/20/2017  . CKD (chronic kidney disease) stage 3, GFR 30-59 ml/min 08/14/2017  . Abdominal hernia without obstruction and without gangrene 01/10/2016  . Chronic diastolic heart failure ([place]) 02/25/2015  . Right renal mass 02/25/2015  . Acute on chronic kidney failure ([place]) 02/24/2015  . Other constipation 02/22/2015  . Chronic venous insufficiency 01/18/2015  . Superficial phlebitis 01/18/2015  . IBS (irritable bowel syndrome) 11/12/2013  . Family history of prostate cancer 12/26/2011  . Diabetes mellitus with renal complications ([place]) 24/40/1027  . Gout 12/31/2007  .  HYPERLIPIDEMIA 08/14/2007  . ERECTILE DYSFUNCTION 05/09/2007  . Abnormal electrocardiogram (ECG) (EKG) 05/09/2007  . Essential hypertension 11/28/2006    Current Outpatient Medications on File Prior to Visit  Medication Sig Dispense Refill  . allopurinol (ZYLOPRIM) 300 MG tablet Take 1 tablet by mouth once daily (Patient taking differently: Take 300 mg by mouth daily. ) 90 tablet 1  . apixaban (ELIQUIS) 5 MG TABS tablet Take 10 mg (two 5 mg tabs) twice a day for 7 days and then 5 mg po BID after 70 tablet 0  . aspirin EC 81 MG tablet Take 81 mg by mouth every morning.    [name] atorvastatin (LIPITOR) 20 MG tablet Take 1 tablet by mouth once daily (Patient taking differently: Take 20 mg by mouth daily. ) 90 tablet 0  . cloNIDine (CATAPRES) 0.1 MG tablet Take 1  tablet (0.1 mg total) by mouth 2 (two) times daily. Follow-up appt is due in Sept must see provider for future refills 180 tablet 0  . furosemide (LASIX) 20 MG tablet TAKE 1 TO 2 TABLETS BY MOUTH ONCE DAILY AS NEEDED FOR  FLUID  EDEMA 90 tablet 3  . glimepiride (AMARYL) 2 MG tablet TAKE 1 TABLET BY MOUTH ONCE DAILY BEFORE BREAKFAST (Patient taking differently: Take 2 mg by mouth daily with breakfast. ) 30 tablet 0  . hydrALAZINE (APRESOLINE) 50 MG tablet Take 1 tablet (50 mg total) by mouth 2 (two) times daily. 60 tablet 0  . iron polysaccharides (NIFEREX) 150 MG capsule Take 1 capsule (150 mg total) by mouth daily. 30 capsule 0  . Lancets (ONETOUCH DELICA PLUS [id number]) MISC USE AS DIRECTED TWICE A DAILY (Patient taking differently: 1 application by Other route 2 (two) times daily. ) 100 each 0  . LINZESS 290 MCG CAPS capsule TAKE 1 CAPSULE BY MOUTH ONCE DAILY BEFORE BREAKFAST (Patient taking differently: Take 290 mcg by mouth daily before breakfast. ) 30 capsule 1  . metoprolol tartrate (LOPRESSOR) 100 MG tablet Take 1 tablet by mouth twice daily (Patient taking differently: Take 100 mg by mouth 2 (two) times daily. ) 180 tablet 0  . ONETOUCH  ULTRA test strip USE TO TEST TWICE DAILY (Patient taking differently: 1 each by Other route 2 (two) times daily. ) 100 strip 3  . oxyCODONE-acetaminophen (PERCOCET) 5-325 MG tablet Take 1 tablet by mouth every 4 (four) hours as needed for moderate pain or severe pain. 30 tablet 0  . sildenafil (VIAGRA) 100 MG tablet TAKE AS DIRECTED (Patient taking differently: Take 100 mg by mouth as needed for erectile dysfunction. ) 6 tablet 1  . sitaGLIPtin (JANUVIA) 50 MG tablet Take 1 tablet (50 mg total) by mouth daily. 30 tablet 0  . tamsulosin (FLOMAX) 0.4 MG CAPS capsule Take 1 capsule (0.4 mg total) by mouth daily after supper. 30 capsule 0  . traMADol (ULTRAM) 50 MG tablet Take 50-100 mg by mouth every 6 (six) hours as needed.     No current facility-administered medications on file prior to visit.     Past Medical History:  Diagnosis Date  . CHF (congestive heart failure) ([place])   . Chronic diastolic heart failure ([place])   . Constipation   . Diabetes mellitus   . Diverticulosis   . ED (erectile dysfunction)   . Gout   . Heart murmur   . Hyperlipidemia   . Hypertension   . IBS (irritable bowel syndrome)   . Renal calculus   . Renal insufficiency   . Tubular adenoma of colon     Past Surgical History:  Procedure Laterality Date  . COLONOSCOPY  1998   negative; no F/U  . EXTRACORPOREAL SHOCK WAVE LITHOTRIPSY Left 03/20/2019   Procedure: EXTRACORPOREAL SHOCK WAVE LITHOTRIPSY (ESWL);  Surgeon: Cleon Gustin, MD;  Location: [facility];  Service: Urology;  Laterality: Left;  . knee effusion tapped      Social History   Socioeconomic History  . Marital status: Married    Spouse name: Not on file  . Number of children: 1  . Years of education: Not on file  . Highest education level: Not on file  Occupational History  . Occupation: Metallurgist: [employer]: [phone]  Social Needs  . Financial resource strain: Not hard at all  . Food  insecurity  Worry: Never true    Inability: Never true  . Transportation needs    Medical: Yes    Non-medical: Yes  Tobacco Use  . Smoking status: Former Smoker    Packs/day: 0.30    Types: Cigarettes    Quit date: 07/04/1991    Years since quitting: 27.7  . Smokeless tobacco: Never Used  . Tobacco comment: smoked age [age], up to 1/3 ppd; may be less  Substance and Sexual Activity  . Alcohol use: Yes    Alcohol/week: 2.0 standard drinks    Types: 2 Glasses of wine per week    Comment: rare  . Drug use: No  . Sexual activity: Yes  Lifestyle  . Physical activity    Days per week: 0 days    Minutes per session: 0 min  . Stress: Not at all  Relationships  . Social connections    Talks on phone: More than three times a week    Gets together: More than three times a week    Attends religious service: More than 4 times per year    Active member of club or organization: Not on file    Attends meetings of clubs or organizations: More than 4 times per year    Relationship status: Married  Other Topics Concern  . Not on file  Social History Narrative  . Not on file    Family History  Problem Relation Age of Onset  . Heart attack Mother 74  . Hypertension Mother   . Prostate cancer Father 86  . Diabetes Father   . Irritable bowel syndrome Father   . Colon cancer Brother        dx in his late 82's  . Irritable bowel syndrome Sister        x 2  . Irritable bowel syndrome Brother   . Stroke Neg Hx   . Esophageal cancer Neg Hx   . Rectal cancer Neg Hx   . Stomach cancer Neg Hx     Review of Systems  Constitutional: Positive for fatigue (energy level slowly improving). Negative for chills and fever.       Appetite improving  Respiratory: Positive for shortness of breath (has some SOB with walking long distances). Negative for cough and wheezing.   Cardiovascular: Positive for leg swelling. Negative for chest pain and palpitations.  Gastrointestinal: Positive for  constipation (chronic).  Genitourinary: Positive for flank pain (kidney stones).  Neurological: Negative for light-headedness and headaches.       Objective:   Vitals:   04/04/19 1006  BP: 124/60  Pulse: (!) 49  Resp: 16  Temp: 98.6 F (37 C)  SpO2: 95%   BP Readings from Last 3 Encounters:  04/04/19 124/60  04/02/19 140/80  03/29/19 (!) 158/89   Wt Readings from Last 3 Encounters:  04/04/19 218 lb (98.9 kg)  04/02/19 219 lb 6.4 oz (99.5 kg)  03/28/19 213 lb (96.6 kg)   Body mass index is 33.15 kg/m.   Physical Exam    Constitutional: Appears well-developed and well-nourished. No distress.  HENT:  Head: Normocephalic and atraumatic.  Neck: Neck supple. No tracheal deviation present. No thyromegaly present.  No cervical lymphadenopathy Cardiovascular: Normal rate, regular rhythm and normal heart sounds.  1/6 systolic murmur heard. No carotid bruit .  1+ pitting bilateral lower extremity edema Pulmonary/Chest: Effort normal and breath sounds normal. No respiratory distress. No has no wheezes. No rales.  Abdomen: soft, NT, ND Skin: Skin is warm and dry.  Not diaphoretic.  Psychiatric: Normal mood and affect. Behavior is normal.   Lab Results  Component Value Date   WBC 7.1 03/29/2019   HGB 10.0 (L) 03/29/2019   HCT 32.4 (L) 03/29/2019   PLT 290 03/29/2019   GLUCOSE 175 (H) 03/29/2019   CHOL 101 03/06/2019   TRIG 64.0 03/06/2019   HDL 36.20 (L) 03/06/2019   LDLDIRECT 80.1 04/15/2014   LDLCALC 52 03/06/2019   ALT 19 03/29/2019   AST 17 03/29/2019   NA 139 03/29/2019   K 4.1 03/29/2019   CL 104 03/29/2019   CREATININE 2.16 (H) 03/29/2019   BUN 22 03/29/2019   CO2 27 03/29/2019   TSH 2.31 12/20/2017   PSA 0.70 12/18/2016   INR 1.2 03/26/2019   HGBA1C 6.3 03/06/2019   MICROALBUR 46.4 (H) 03/06/2019    US RENAL CLINICAL DATA:  Acute kidney injury. Technologist notes state lithotripsy on the right 1 week ago.  EXAM: RENAL / URINARY TRACT ULTRASOUND  COMPLETE  COMPARISON:  Noncontrast CT 03/24/2019  FINDINGS: Right Kidney:  Renal measurements: 11.1 x 5.5 x 5.3 cm = volume: 168 mL. 2.2 cm simple cyst in the upper right kidney. Small stone lower right kidney on prior CT is not resolved sonographically. Echogenicity within normal limits. No solid mass or hydronephrosis visualized.  Left Kidney:  Renal measurements: 12.4 x 6.6 x 5.4 cm = volume: 232 mL. Mild hydronephrosis. 14 mm shadowing calculus in the lower left kidney. The proximal ureteral calculi on prior CT are not definitively seen. Parapelvic cyst measuring 19 mm. No solid mass.  Bladder:  Appears normal for degree of bladder distention.  IMPRESSION: 1. Mild left hydronephrosis, also seen on CT 3 days ago. The previous proximal left ureteral calculi are not visualized sonographically. Nonobstructing stone in the lower left kidney. 2. No right hydronephrosis. Nonobstructing right renal stone on CT is not visualized. 3. Bilateral renal cysts.  Electronically Signed   By: Keith Rake M.D.   On: 03/27/2019 22:06    Assessment & Plan:    See Problem List for Assessment and Plan of chronic medical problems.

## 2019-04-03 NOTE — Patient Instructions (Addendum)
  Have a chest x-ray in 3-4 weeks to make sure the pneumonia has completely resolved.    A CT scan of your lungs was ordered for may 2021  Medications reviewed and updated.  Changes include :   none  Your prescription(s) have been submitted to your pharmacy. Please take as directed and contact our office if you believe you are having problem(s) with the medication(s).   Please followup in 3 months

## 2019-04-04 ENCOUNTER — Ambulatory Visit (INDEPENDENT_AMBULATORY_CARE_PROVIDER_SITE_OTHER): Payer: Medicare Other | Admitting: Internal Medicine

## 2019-04-04 ENCOUNTER — Encounter: Payer: Self-pay | Admitting: Internal Medicine

## 2019-04-04 ENCOUNTER — Other Ambulatory Visit: Payer: Self-pay

## 2019-04-04 VITALS — BP 124/60 | HR 49 | Temp 98.6°F | Resp 16 | Ht 68.0 in | Wt 218.0 lb

## 2019-04-04 DIAGNOSIS — N179 Acute kidney failure, unspecified: Secondary | ICD-10-CM

## 2019-04-04 DIAGNOSIS — I5032 Chronic diastolic (congestive) heart failure: Secondary | ICD-10-CM

## 2019-04-04 DIAGNOSIS — E1129 Type 2 diabetes mellitus with other diabetic kidney complication: Secondary | ICD-10-CM

## 2019-04-04 DIAGNOSIS — N189 Chronic kidney disease, unspecified: Secondary | ICD-10-CM

## 2019-04-04 DIAGNOSIS — N200 Calculus of kidney: Secondary | ICD-10-CM | POA: Diagnosis not present

## 2019-04-04 DIAGNOSIS — R911 Solitary pulmonary nodule: Secondary | ICD-10-CM | POA: Insufficient documentation

## 2019-04-04 DIAGNOSIS — R918 Other nonspecific abnormal finding of lung field: Secondary | ICD-10-CM

## 2019-04-04 DIAGNOSIS — J189 Pneumonia, unspecified organism: Secondary | ICD-10-CM

## 2019-04-04 DIAGNOSIS — I10 Essential (primary) hypertension: Secondary | ICD-10-CM

## 2019-04-04 DIAGNOSIS — I2699 Other pulmonary embolism without acute cor pulmonale: Secondary | ICD-10-CM

## 2019-04-04 DIAGNOSIS — I824Z3 Acute embolism and thrombosis of unspecified deep veins of distal lower extremity, bilateral: Secondary | ICD-10-CM

## 2019-04-04 MED ORDER — APIXABAN 5 MG PO TABS: 5.0000 mg | ORAL_TABLET | Freq: Two times a day (BID) | ORAL | 0 refills | Status: DC

## 2019-04-04 MED ORDER — ALLOPURINOL 300 MG PO TABS: 300.0000 mg | ORAL_TABLET | Freq: Every day | ORAL | 1 refills | Status: DC

## 2019-04-04 NOTE — Assessment & Plan Note (Signed)
Right lower lobe pneumonia Completed antibiotics Clinically doing better-no fever, cough or wheeze Has shortness of breath with moderate activity, but that may not necessarily be new, but he has noticed it now Chest x-ray 3-4 weeks to confirm complete resolution of pneumonia

## 2019-04-04 NOTE — Assessment & Plan Note (Signed)
Acute on chronic kidney disease failure related to dehydration Improved with IV fluids Just had kidney function checked by urology-we will try to get those labs so we do not have to repeat again today Continue increased fluids

## 2019-04-04 NOTE — Assessment & Plan Note (Signed)
Has some lower extremity edema, but otherwise euvolemic Continue current medications

## 2019-04-04 NOTE — Assessment & Plan Note (Signed)
Following with urology Conservative treatment with fluids, pain medication since he is on Eliquis

## 2019-04-04 NOTE — Assessment & Plan Note (Signed)
Incidental finding of a 5-6 millimeter left lower lobe nodule on recent CT scan Will repeat CT in May 2021-ordered

## 2019-04-04 NOTE — Assessment & Plan Note (Signed)
Taken off metformin with elevated creatinine in the hospital Sugars have been higher with just the glimepiride and Januvia Has a follow-up appointment with Dr. Dwyane Dee scheduled He just recently had kidney function done by urology so we will not repeat today-we will need to get those results if they are not since so it can be determine what medication he can take for sugars

## 2019-04-04 NOTE — Progress Notes (Signed)
Addendum: Reviewed and agree with assessment and management plan. Shaye Lagace M, MD  

## 2019-04-04 NOTE — Assessment & Plan Note (Signed)
Oxygenation here 95% on room air Shortness of breath only with moderate activity, but not daily activities No cough, wheeze or fevers On Eliquis-we will be seeing hematology for hypercoagulable work-up, but would most likely be on Eliquis for 3-6 months

## 2019-04-04 NOTE — Assessment & Plan Note (Signed)
Blood pressure well controlled Continue current medications-hydralazine dose was increased in the hospital

## 2019-04-04 NOTE — Assessment & Plan Note (Signed)
He does have some bilateral lower extremity edema, which he feels is at baseline He has chronic venous insufficiency, but the DVTs could make his swelling worse Encouraged compression socks Continue furosemide at current dose On Eliquis-we will be seeing hematology for hypercoagulable work-up, but would most likely be on Eliquis for 3-6 months

## 2019-04-07 ENCOUNTER — Encounter: Payer: Self-pay | Admitting: Internal Medicine

## 2019-04-07 ENCOUNTER — Telehealth: Payer: Self-pay

## 2019-04-07 ENCOUNTER — Other Ambulatory Visit: Payer: Self-pay | Admitting: Internal Medicine

## 2019-04-07 ENCOUNTER — Other Ambulatory Visit: Payer: Self-pay

## 2019-04-07 NOTE — Telephone Encounter (Signed)
Letter ready for pick up. Pt is aware.  

## 2019-04-07 NOTE — Telephone Encounter (Signed)
Copied from [place] [id number]. Topic: General - Other >> Apr 07, 2019 11:29 AM Ivar Drape wrote: Reason for CRM:  Patient stated he was in the office on 04/04/2019 and Dr. Quay Burow said she would have a letter to [facility] on behalf of the patient ready today.  Patient is adamant that the letter needs to be done today.

## 2019-04-08 ENCOUNTER — Encounter: Payer: Self-pay | Admitting: Endocrinology

## 2019-04-08 ENCOUNTER — Ambulatory Visit: Payer: Medicare Other | Admitting: Endocrinology

## 2019-04-08 VITALS — BP 110/60 | HR 56 | Ht 68.0 in | Wt 217.6 lb

## 2019-04-08 DIAGNOSIS — N184 Chronic kidney disease, stage 4 (severe): Secondary | ICD-10-CM

## 2019-04-08 DIAGNOSIS — E1165 Type 2 diabetes mellitus with hyperglycemia: Secondary | ICD-10-CM

## 2019-04-08 MED ORDER — GLIMEPIRIDE 2 MG PO TABS: ORAL_TABLET | ORAL | 1 refills | Status: DC

## 2019-04-08 NOTE — Progress Notes (Signed)
Patient ID: Noah Barnes, male   DOB: March 07, 1946, 73 y.o.   MRN: [id number]    Reason for Appointment : Follow-up visit  History of Present Illness          Diagnosis: Type 2 diabetes mellitus, date of diagnosis: 2008       Past history: His diabetes was borderline in the beginning and not clear what medications he was started on. Has been taking metformin for several years and this was later changed to [place]. Amaryl probably started in 5/14 when blood sugars were higher, initially was given 1 mg and now has been taking 2 mg. He had a relatively high A1c of 7.1, difficulty losing weight and a glucose of 202 on his initial consultation in 06/2013 He was then tried on Invokana in addition to his Janumet and Amaryl Invokana was reduced to 100 mg in early 2015 been creatinine had gone up to 1.6  Recent history:   Oral hypoglycemic drugs the patient is taking are: Amaryl 2 mg at supper, Janumet 50/1000 twice daily    His A1c is 6.3 done in early September previous range 5.8-6.5  Current management, blood sugar patterns and problems identified:  His metformin was stopped when he was hospitalized last month because of worsening renal function  Subsequently his blood sugars have been higher in the morning  The last few days his blood sugars have been mostly over 150 and he is starting to check a little more often  However has no readings after meals  He is generally eating relatively healthy meals  Has not been able to exercise or walk consistently because of recent illnesses but is trying to start back a little down  His weight is down compared to 5/20  Side effects from medications have been:  renal dysfunction from Invokana  Glucose monitoring:  done <1 times a day         Glucometer: One Touch   Blood Glucose readings recently from download for the last 30 days   PRE-MEAL Fasting Lunch Dinner Bedtime Overall  Glucose range:  101-189      Mean/median:      154       Glycemic control:   Lab Results  Component Value Date   HGBA1C 6.3 03/06/2019   HGBA1C 6.5 11/19/2018   HGBA1C 6.0 06/18/2018   Lab Results  Component Value Date   MICROALBUR 46.4 (H) 03/06/2019   LDLCALC 52 03/06/2019   CREATININE 2.16 (H) 03/29/2019    Self-care: Usually has low-fat diet  Exercise:  Only occasionally walking    Dietician consultations: 08/2013              Weight history:   Wt Readings from Last 3 Encounters:  04/08/19 217 lb 9.6 oz (98.7 kg)  04/04/19 218 lb (98.9 kg)  04/02/19 219 lb 6.4 oz (99.5 kg)       Allergies as of 04/08/2019      Reactions   Ace Inhibitors Other (See Comments)   Severe AKI due to ARB + dehydration Aug 2016   Angiotensin Receptor Blockers Other (See Comments)   Severe AKI due to ARB + dehydration Aug 2016      Medication List       Accurate as of April 08, 2019  9:34 AM. If you have any questions, ask your nurse or doctor.        STOP taking these medications   aspirin EC 81 MG tablet Stopped by: Elayne Snare,  MD   furosemide 20 MG tablet Commonly known as: LASIX Stopped by: Elayne Snare, MD     TAKE these medications   allopurinol 300 MG tablet Commonly known as: ZYLOPRIM Take 1 tablet (300 mg total) by mouth daily.   apixaban 5 MG Tabs tablet Commonly known as: ELIQUIS Take 1 tablet (5 mg total) by mouth 2 (two) times daily.   atorvastatin 20 MG tablet Commonly known as: LIPITOR Take 1 tablet by mouth once daily   cloNIDine 0.1 MG tablet Commonly known as: CATAPRES Take 1 tablet (0.1 mg total) by mouth 2 (two) times daily.   glimepiride 2 MG tablet Commonly known as: AMARYL TAKE 1 TABLET BY MOUTH ONCE DAILY BEFORE BREAKFAST What changed:   how much to take  how to take this  when to take this  additional instructions   hydrALAZINE 50 MG tablet Commonly known as: APRESOLINE Take 1 tablet (50 mg total) by mouth 2 (two) times daily.   iron polysaccharides 150 MG capsule Commonly  known as: NIFEREX Take 1 capsule (150 mg total) by mouth daily.   Linzess 290 MCG Caps capsule Generic drug: linaclotide TAKE 1 CAPSULE BY MOUTH ONCE DAILY BEFORE BREAKFAST What changed: See the new instructions.   metoprolol tartrate 100 MG tablet Commonly known as: LOPRESSOR Take 1 tablet by mouth twice daily   OneTouch Delica Plus [id number] Misc USE AS DIRECTED TWICE A DAILY What changed: See the new instructions.   OneTouch Ultra test strip Generic drug: glucose blood USE TO TEST TWICE DAILY What changed: See the new instructions.   oxyCODONE-acetaminophen 5-325 MG tablet Commonly known as: Percocet Take 1 tablet by mouth every 4 (four) hours as needed for moderate pain or severe pain.   sildenafil 100 MG tablet Commonly known as: VIAGRA TAKE AS DIRECTED What changed:   how much to take  how to take this  when to take this  reasons to take this  additional instructions   sitaGLIPtin 50 MG tablet Commonly known as: Januvia Take 1 tablet (50 mg total) by mouth daily.   tamsulosin 0.4 MG Caps capsule Commonly known as: Flomax Take 1 capsule (0.4 mg total) by mouth daily after supper.   traMADol 50 MG tablet Commonly known as: ULTRAM Take 50-100 mg by mouth every 6 (six) hours as needed.       Allergies:  Allergies  Allergen Reactions  . Ace Inhibitors Other (See Comments)    Severe AKI due to ARB + dehydration Aug 2016  . Angiotensin Receptor Blockers Other (See Comments)    Severe AKI due to ARB + dehydration Aug 2016    Past Medical History:  Diagnosis Date  . CHF (congestive heart failure) ([place])   . Chronic diastolic heart failure ([place])   . Constipation   . Diabetes mellitus   . Diverticulosis   . ED (erectile dysfunction)   . Gout   . Heart murmur   . Hyperlipidemia   . Hypertension   . IBS (irritable bowel syndrome)   . Renal calculus   . Renal insufficiency   . Tubular adenoma of colon     Past Surgical History:  Procedure  Laterality Date  . COLONOSCOPY  1998   negative; no F/U  . EXTRACORPOREAL SHOCK WAVE LITHOTRIPSY Left 03/20/2019   Procedure: EXTRACORPOREAL SHOCK WAVE LITHOTRIPSY (ESWL);  Surgeon: Cleon Gustin, MD;  Location: [facility];  Service: Urology;  Laterality: Left;  . knee effusion tapped      Family History  Problem Relation Age of Onset  . Heart attack Mother 55  . Hypertension Mother   . Prostate cancer Father 38  . Diabetes Father   . Irritable bowel syndrome Father   . Colon cancer Brother        dx in his late 68's  . Irritable bowel syndrome Sister        x 2  . Irritable bowel syndrome Brother   . Stroke Neg Hx   . Esophageal cancer Neg Hx   . Rectal cancer Neg Hx   . Stomach cancer Neg Hx     Social History:  reports that he quit smoking about 27 years ago. His smoking use included cigarettes. He smoked 0.30 packs per day. He has never used smokeless tobacco. He reports current alcohol use of about 2.0 standard drinks of alcohol per week. He reports that he does not use drugs.    Review of Systems   RENAL dysfunction: Creatinine is relatively higher with recent hospitalization     Lab Results  Component Value Date   CREATININE 2.16 (H) 03/29/2019   CREATININE 2.66 (H) 03/28/2019   CREATININE 2.64 (H) 03/28/2019   CREATININE 2.57 (H) 03/27/2019     Lab Results  Component Value Date   K 4.1 03/29/2019          Lipids: Has been on Lipitor 20 mg for hyperlipidemia, baseline LDL about 162 Followed by Dr. Quay Burow  Lab Results  Component Value Date   CHOL 101 03/06/2019   HDL 36.20 (L) 03/06/2019   LDLCALC 52 03/06/2019   LDLDIRECT 80.1 04/15/2014   TRIG 64.0 03/06/2019   CHOLHDL 3 03/06/2019       The blood pressure has been high for several years and usually well controlled.  Currently taking, hydralazine, clonidine and metoprolol.   BP Readings from Last 3 Encounters:  04/08/19 110/60  04/04/19 124/60  04/02/19 140/80      LABS:  No visits  with results within 1 Week(s) from this visit.  Latest known visit with results is:  No results displayed because visit has over 200 results.      Physical Examination:  BP 110/60 (BP Location: Left Arm, Patient Position: Sitting, Cuff Size: Normal)   Pulse (!) 56   Ht 5\' 8"  (1.727 m)   Wt 217 lb 9.6 oz (98.7 kg)   SpO2 95%   BMI 33.09 kg/m     No ankle edema present  ASSESSMENT/PLAN:   Diabetes type 2, with obesity  See history of present illness for detailed discussion of current diabetes management, blood sugar patterns and problems identified  He is now on Januvia 50 mg and Amaryl 2 mg  A1c was 6.3 in early September  However his blood sugars are rising with stopping metformin because of renal dysfunction He does not think his diet has been poor Also no postprandial reading available also  Since his fasting readings are consistently high we will go up to 3 mg Amaryl for the next week and if morning sugars are still not improved go up to 4 mg He will also check his blood sugars regularly after dinner  Follow-up in 6 weeks  RENAL dysfunction: Creatinine over 2  Renal function is somewhat worse and will defer any management to PCP   HYPERTENSION: To follow-up with PCP  He has had his influenza vaccine in the hospital    There are no Patient Instructions on file for this visit.   Elayne Snare 04/08/2019, 9:34 AM

## 2019-04-08 NOTE — Patient Instructions (Signed)
Check blood sugars on waking up 3 days a week  Also check blood sugars about 2 hours after meals and do this after different meals by rotation  Recommended blood sugar levels on waking up are 90-130 and about 2 hours after meal is 130-160  Please bring your blood sugar monitor to each visit, thank you  Glimeperide 1 1/2 before supper and if sugars >150 go to 2 pills at supper

## 2019-04-09 ENCOUNTER — Other Ambulatory Visit: Payer: Self-pay | Admitting: Internal Medicine

## 2019-04-14 ENCOUNTER — Other Ambulatory Visit: Payer: Self-pay | Admitting: Hematology

## 2019-04-14 DIAGNOSIS — I824Z3 Acute embolism and thrombosis of unspecified deep veins of distal lower extremity, bilateral: Secondary | ICD-10-CM

## 2019-04-14 NOTE — Progress Notes (Signed)
[place] CONSULT NOTE  Patient Care Team: Binnie Rail, MD as PCP - General (Internal Medicine) Sherren Mocha, MD as PCP - Cardiology (Cardiology)  HEME/ONC OVERVIEW: 1. Bilateral DVT with suspected PTE, likely provoked -03/2019: acute DVT involving R posterior tibial veins and L popliteal vein; intermediate perfusion abnormality on V/Q scan in the setting of bilateral PNA (CKD); no RV strain on echocardiogram  On Eliquis 5mg  BID  2. Incidental LLL nodule  -29mm nodule noted on CT in 03/2019  PERTINENT NON-HEM/ONC PROBLEMS: 1. 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  He presented to [facility] 1 week later for new onset dyspnea with pleuritic right-sided chest pain, cough, and low-grade fever.  CT chest showed patchy bilateral airspace opacities with tree-in-bud nodular densities, R > L, suspicious for pneumonia.  In addition, there was a ~73mm nodule in the LLL, etiology unclear.  Due to bilateral lower extremity swelling, Doppler was done, which showed acute DVT involving the right posterior tibial veins and left popliteal vein.  Due to the patient's advanced CKD, IV contrast could not be administered, and V/Q scan showed intermediate probability for PTE.  Patient was treated with broad-spectrum antibiotics for suspected bilateral pneumonia, as well as heparin drip for DVT and possible PTE.  He was discharged home with Eliquis, and referred to hematology for further management of VTE.  -I reviewed with the patient about the plan for care for DVT and likely PTE. -This recent episode of  blood clot may be provoked, given the procedure one week prior to VTE.  We discussed about the pros and cons about testing for thrombophilia disorder. His current anticoagulation therapy will interfere with some the tests and it is not possible to interpret the test results. Taking him off the anticoagulation therapy to do the tests may precipitate another thrombotic event. I do not see a reason to order additional testing to screen for thrombophilia disorder as it would not change our management. In the absence of major contraindications, the goal of anticoagulation therapy is lifelong.  -As he is tolerating Eliquis well, we will continue Eliquis 5mg  BID for at least 6 months.  If he has no evidence of recurrent VTE after 6 months of anticoagulation, we can consider reducing the dose to 2.5mg  BID for secondary ppx.  -I recommend the patient to use elastic compression stockings at 20-30 mmHg to reduce risks of chronic thrombophlebitis. -Finally, I reinforced the importance of preventive strategies such as avoiding hormonal supplement, avoiding cigarette smoking, keeping up-to-date with screening programs for early cancer detection, frequent ambulation for long distance travel and aggressive DVT prophylaxis in all surgical settings. -Should he need any interruption of the anticoagulation for elective procedures in the future, feel free to contact me regarding peri-operative management.  Incidental LLL  -Noted incidentally on CT in 03/2019 -I reviewed the screening guideline in detail with the patient (Guidelines for Management of Incidental Pulmonary Nodules Detected on CT Images: From the Fleischner Society 2017; Radiology 2017; [phone]).  -We will plan to repeat CT chest w/o contrast in 1 year (ie 03/2020), and if stable, then at 24 months (from the  time of the initial scan) for high-risk patients (ie patients with hx of tobacco use)  Normocytic anemia -Likely secondary to Stage IV CKD  -Hgb 10.8  today, slightly improving -Clinically, patient denies any symptoms of bleeding -Last colonoscopy was done in 2015; I recommend the patient to contact his gastroenterologist for screening colonoscopy   AKI on CKD -Secondary to poorly controlled DM II -Baseline Cr fluctuates widely, but was 2.16 at the time of discharge in 03/2019 -Cr 3.8 today, significantly higher than his baseline; BUN 73, electrolytes normal -Given the patient's elevated glucose and decreased PO intake, the AKI is most likely due to hypovolemia -I discussed with the patient about IV fluid support, but he declined to receive treatment today or for the rest of the week; after some lengthy discussion, he agreed to receive 1L NS on 04/21/2019 -I strongly counseled the patient on the importance of maintaining adequate hydration over the weekend, and if he notices steady decline in his urine output, he should go to the emergency department for further evaluation -I also recommended the patient to follow up with his PCP next week for further management of CKD; -Finally, patient has not yet been established with nephrology, and I have placed urgent referral to nephrology for further management  Orders Placed This Encounter  Procedures  . CBC with Differential (Cancer Center Only)    Standing Status:   Future    Standing Expiration Date:   05/21/2020  . CMP ([place] only)    Standing Status:   Future    Standing Expiration Date:   05/21/2020  . D-dimer, quantitative (not at [facility])    Standing Status:   Future    Standing Expiration Date:   05/21/2020  . Ambulatory referral to Nephrology    Referral Priority:   Urgent    Referral Type:   Consultation    Referral Reason:   Specialty Services Required    Requested Specialty:   Nephrology    Number of Visits Requested:   1   All questions were answered. The patient knows to call the clinic with any problems, questions or concerns.  Return in 3 months for labs and clinic  follow-up.  Tish Men, MD 04/17/2019 12:41 PM   CHIEF COMPLAINTS/PURPOSE OF CONSULTATION:  "I am doing okay"  HISTORY OF PRESENTING ILLNESS:  [name] 73 y.o. male is here because of recently diagnosed bilateral LE DVT and possible PTE.  Patient has history of nephrolithiasis s/p lithotripsy in mid-03/2019.  He presented to [facility] ER 1 week later for new onset dyspnea with pleuritic right-sided chest pain, cough, and low-grade fever.  CT chest showed patchy bilateral airspace opacities with tree-in-bud nodular densities, R > L, suspicious for pneumonia.  In addition, there was a ~58mm nodule in the LLL, etiology unclear.  Due to bilateral lower extremity swelling, Doppler was done, which showed acute DVT involving the right posterior tibial veins and left popliteal vein.  Due to the patient's advanced CKD, IV contrast could not be administered, and V/Q scan showed intermediate probability for PTE.  Patient was treated with broad-spectrum antibiotics for suspected bilateral pneumonia, as well as heparin drip for DVT and possible PTE.  He was discharged home with Eliquis, and referred to hematology for further management of VTE.   Patient reports that since recent discharge, he has been tolerating Eliquis well without any abnormal bleeding or bruising.  He has occasional left-sided low back pain, which he attributes to his kidney stones, and he  has an appointment with his urologist on 04/21/2019 for further management after apparently failing lithotripsy in 03/2019.  He reports having had some changes in his diabetes medications, and his glucose has been elevated at home.  He also has not been eating and drinking as much as he used to.  He denies any other complaint today.  REVIEW OF SYSTEMS:   Constitutional: ( - ) fevers, ( - )  chills , ( - ) night sweats Eyes: ( - ) blurriness of vision, ( - ) double vision, ( - ) watery eyes Ears, nose, mouth, throat, and face: ( - ) mucositis, ( - ) sore  throat Respiratory: ( - ) cough, ( - ) dyspnea, ( - ) wheezes Cardiovascular: ( - ) palpitation, ( - ) chest discomfort, ( + ) lower extremity swelling Gastrointestinal:  ( - ) nausea, ( - ) heartburn, ( - ) change in bowel habits Skin: ( - ) abnormal skin rashes Lymphatics: ( - ) new lymphadenopathy, ( - ) easy bruising Neurological: ( - ) numbness, ( - ) tingling, ( - ) new weaknesses Behavioral/Psych: ( - ) mood change, ( - ) new changes  All other systems were reviewed with the patient and are negative.  I have reviewed his chart and materials related to his cancer extensively and collaborated history with the patient. Summary of oncologic history is as follows: Oncology History   No history exists.    MEDICAL HISTORY:  Past Medical History:  Diagnosis Date  . CHF (congestive heart failure) ([place])   . Chronic diastolic heart failure ([place])   . Constipation   . Diabetes mellitus   . Diverticulosis   . ED (erectile dysfunction)   . Gout   . Heart murmur   . Hyperlipidemia   . Hypertension   . IBS (irritable bowel syndrome)   . Renal calculus   . Renal insufficiency   . Tubular adenoma of colon     SURGICAL HISTORY: Past Surgical History:  Procedure Laterality Date  . COLONOSCOPY  1998   negative; no F/U  . EXTRACORPOREAL SHOCK WAVE LITHOTRIPSY Left 03/20/2019   Procedure: EXTRACORPOREAL SHOCK WAVE LITHOTRIPSY (ESWL);  Surgeon: Cleon Gustin, MD;  Location: [facility]S;  Service: Urology;  Laterality: Left;  . knee effusion tapped      SOCIAL HISTORY: Social History   Socioeconomic History  . Marital status: Married    Spouse name: Not on file  . Number of children: 1  . Years of education: Not on file  . Highest education level: Not on file  Occupational History  . Occupation: Metallurgist: [employer]: [phone]  Social Needs  . Financial resource strain: Not hard at all  . Food insecurity    Worry: Never true     Inability: Never true  . Transportation needs    Medical: Yes    Non-medical: Yes  Tobacco Use  . Smoking status: Former Smoker    Packs/day: 0.30    Types: Cigarettes    Quit date: 07/04/1991    Years since quitting: 27.8  . Smokeless tobacco: Never Used  . Tobacco comment: smoked age [age], up to 1/3 ppd; may be less  Substance and Sexual Activity  . Alcohol use: Yes    Alcohol/week: 2.0 standard drinks    Types: 2 Glasses of wine per week    Comment: rare  . Drug use: No  . Sexual  activity: Yes  Lifestyle  . Physical activity    Days per week: 0 days    Minutes per session: 0 min  . Stress: Not at all  Relationships  . Social connections    Talks on phone: More than three times a week    Gets together: More than three times a week    Attends religious service: More than 4 times per year    Active member of club or organization: Not on file    Attends meetings of clubs or organizations: More than 4 times per year    Relationship status: Married  . Intimate partner violence    Fear of current or ex partner: No    Emotionally abused: No    Physically abused: No    Forced sexual activity: No  Other Topics Concern  . Not on file  Social History Narrative  . Not on file    FAMILY HISTORY: Family History  Problem Relation Age of Onset  . Heart attack Mother 13  . Hypertension Mother   . Prostate cancer Father 67  . Diabetes Father   . Irritable bowel syndrome Father   . Colon cancer Brother        dx in his late 38's  . Irritable bowel syndrome Sister        x 2  . Irritable bowel syndrome Brother   . Stroke Neg Hx   . Esophageal cancer Neg Hx   . Rectal cancer Neg Hx   . Stomach cancer Neg Hx     ALLERGIES:  is allergic to ace inhibitors and angiotensin receptor blockers.  MEDICATIONS:  Current Outpatient Medications  Medication Sig Dispense Refill  . allopurinol (ZYLOPRIM) 300 MG tablet Take 1 tablet (300 mg total) by mouth daily. 90 tablet 1  .  apixaban (ELIQUIS) 5 MG TABS tablet Take 1 tablet (5 mg total) by mouth 2 (two) times daily. 60 tablet 4  . atorvastatin (LIPITOR) 20 MG tablet Take 1 tablet by mouth once daily (Patient taking differently: Take 20 mg by mouth daily. ) 90 tablet 0  . cloNIDine (CATAPRES) 0.1 MG tablet Take 1 tablet (0.1 mg total) by mouth 2 (two) times daily. 180 tablet 0  . glimepiride (AMARYL) 2 MG tablet Take 1-1/2 tablets before dinner 45 tablet 1  . hydrALAZINE (APRESOLINE) 50 MG tablet Take 1 tablet (50 mg total) by mouth 2 (two) times daily. 60 tablet 0  . iron polysaccharides (NIFEREX) 150 MG capsule Take 1 capsule (150 mg total) by mouth daily. 30 capsule 0  . Lancets (ONETOUCH DELICA PLUS [id number]) MISC USE AS DIRECTED TWICE A DAILY (Patient taking differently: 1 application by Other route 2 (two) times daily. ) 100 each 0  . LINZESS 290 MCG CAPS capsule TAKE 1 CAPSULE BY MOUTH ONCE DAILY BEFORE BREAKFAST (Patient taking differently: Take 290 mcg by mouth daily before breakfast. ) 30 capsule 1  . metoprolol tartrate (LOPRESSOR) 100 MG tablet Take 1 tablet by mouth twice daily 180 tablet 0  . ONETOUCH ULTRA test strip USE TO TEST TWICE DAILY (Patient taking differently: 1 each by Other route 2 (two) times daily. ) 100 strip 3  . oxyCODONE-acetaminophen (PERCOCET) 5-325 MG tablet Take 1 tablet by mouth every 4 (four) hours as needed for moderate pain or severe pain. 30 tablet 0  . sildenafil (VIAGRA) 100 MG tablet Take 1 tablet (100 mg total) by mouth as needed for erectile dysfunction. 6 tablet 1  . sitaGLIPtin (JANUVIA) 50 MG  tablet Take 1 tablet (50 mg total) by mouth daily. 30 tablet 0  . tamsulosin (FLOMAX) 0.4 MG CAPS capsule Take 1 capsule (0.4 mg total) by mouth daily after supper. 30 capsule 0  . traMADol (ULTRAM) 50 MG tablet Take 50-100 mg by mouth every 6 (six) hours as needed.     No current facility-administered medications for this visit.     PHYSICAL EXAMINATION: ECOG PERFORMANCE STATUS:  2 - Symptomatic, <50% confined to bed  Vitals:   04/17/19 1150  BP: (!) 102/38  Pulse: 65  Resp: 17  Temp: 97.8 F (36.6 C)  SpO2: 100%   Filed Weights   04/17/19 1150  Weight: 209 lb 6.4 oz (95 kg)    GENERAL: alert, no distress and comfortable SKIN: skin color, texture, turgor are normal, no rashes or significant lesions EYES: conjunctiva are pink and non-injected, sclera clear OROPHARYNX: no exudate, no erythema; lips, buccal mucosa, and tongue normal  NECK: supple, non-tender LUNGS: clear to auscultation with normal breathing effort HEART: regular rate & rhythm, no murmurs, trace bilateral lower extremity edema near the ankles  ABDOMEN: soft, non-tender, non-distended, normal bowel sounds Musculoskeletal: no cyanosis of digits and no clubbing  PSYCH: alert & oriented x 3, fluent speech NEURO: no focal motor/sensory deficits  LABORATORY DATA:  I have reviewed the data as listed Lab Results  Component Value Date   WBC 6.3 04/17/2019   HGB 10.8 (L) 04/17/2019   HCT 35.8 (L) 04/17/2019   MCV 86.7 04/17/2019   PLT 342 04/17/2019   Lab Results  Component Value Date   NA 135 04/17/2019   K 4.9 04/17/2019   CL 111 04/17/2019   CO2 12 (L) 04/17/2019    RADIOGRAPHIC STUDIES: I have personally reviewed the radiological images as listed and agreed with the findings in the report. Dg Abd 1 View  Result Date: 03/20/2019 CLINICAL DATA:  Preoperative exam for lithotripsy on the left. EXAM: ABDOMEN - 1 VIEW COMPARISON:  CT 03/02/2019 FINDINGS: Redemonstrated is a 12 mm stone in the left renal pelvis and an indistinct 1 cm stone in the lower pole. 5 mm stone evident in the lower pole of the right kidney. No sign of ureteral stone. Bowel gas pattern is normal. No significant bone finding. IMPRESSION: 12 mm stone in the left renal pelvis region. 1 cm stone in the left lower pole. 5 mm stone in the right lower pole. Electronically Signed   By: Nelson Chimes M.D.   On: 03/20/2019 09:32    Ct Chest Wo Contrast  Result Date: 03/24/2019 CLINICAL DATA:  Cough, chest pain, shortness of breath EXAM: CT CHEST WITHOUT CONTRAST TECHNIQUE: Multidetector CT imaging of the chest was performed following the standard protocol without IV contrast. COMPARISON:  Chest x-ray earlier today FINDINGS: Cardiovascular: Scattered aortic and coronary artery calcifications. Heart is normal size. Aorta is normal caliber. Mediastinum/Nodes: No mediastinal, hilar, or axillary adenopathy. Lungs/Pleura: Patchy airspace opacities in the upper lobes, right greater than left, most pronounced peripherally. Some associated tree-in-bud nodular densities in both upper lobes. Areas of consolidation noted in the right lower lobe/lung base. Probable left base atelectasis. Small right pleural effusion. Upper Abdomen: Imaging into the upper abdomen shows no acute findings. Musculoskeletal: Chest wall soft tissues are unremarkable. No acute bony abnormality. IMPRESSION: Patchy bilateral airspace opacities with tree-in-bud nodular densities in the upper lobes bilaterally, right greater than left. More confluent consolidation in the right lower lobe. Findings concerning for pneumonia. 5-6 mm nodule in the left lower  lobe along the fissure. Non-contrast chest CT at 6-12 months is recommended. If the nodule is stable at time of repeat CT, then future CT at 18-24 months (from today's scan) is considered optional for low-risk patients, but is recommended for high-risk patients. This recommendation follows the consensus statement: Guidelines for Management of Incidental Pulmonary Nodules Detected on CT Images: From the Fleischner Society 2017; Radiology 2017; [phone]. Small right pleural effusion. Coronary artery disease. Aortic Atherosclerosis (ICD10-I70.0). Electronically Signed   By: Rolm Baptise M.D.   On: 03/24/2019 19:51   US Renal  Result Date: 03/27/2019 CLINICAL DATA:  Acute kidney injury. Technologist notes state lithotripsy  on the right 1 week ago. EXAM: RENAL / URINARY TRACT ULTRASOUND COMPLETE COMPARISON:  Noncontrast CT 03/24/2019 FINDINGS: Right Kidney: Renal measurements: 11.1 x 5.5 x 5.3 cm = volume: 168 mL. 2.2 cm simple cyst in the upper right kidney. Small stone lower right kidney on prior CT is not resolved sonographically. Echogenicity within normal limits. No solid mass or hydronephrosis visualized. Left Kidney: Renal measurements: 12.4 x 6.6 x 5.4 cm = volume: 232 mL. Mild hydronephrosis. 14 mm shadowing calculus in the lower left kidney. The proximal ureteral calculi on prior CT are not definitively seen. Parapelvic cyst measuring 19 mm. No solid mass. Bladder: Appears normal for degree of bladder distention. IMPRESSION: 1. Mild left hydronephrosis, also seen on CT 3 days ago. The previous proximal left ureteral calculi are not visualized sonographically. Nonobstructing stone in the lower left kidney. 2. No right hydronephrosis. Nonobstructing right renal stone on CT is not visualized. 3. Bilateral renal cysts. Electronically Signed   By: Keith Rake M.D.   On: 03/27/2019 22:06   Nm Pulmonary Vent And Perf (v/q Scan)  Result Date: 03/25/2019 CLINICAL DATA:  Elevated D-dimer, progressive shortness of breath, cough, mild fever, some RIGHT-side chest pain, question pulmonary embolism EXAM: NUCLEAR MEDICINE VENTILATION - PERFUSION LUNG SCAN TECHNIQUE: Ventilation images were obtained in multiple projections using inhaled aerosol Tc-54m DTPA. Perfusion images were obtained in multiple projections after intravenous injection of Tc-38m MAA. RADIOPHARMACEUTICALS:  29.5 mCi of Tc-66m DTPA aerosol inhalation and 1.57 mCi Tc46m MAA IV COMPARISON:  Chest radiograph 03/24/2019, CT chest 03/24/2019 FINDINGS: Ventilation: Focal ventilatory defects at the base of the RIGHT lower lobe and at the lateral aspect of the lingula. Diminished ventilation at LEFT apex. Mild elevation of RIGHT diaphragm. Perfusion: Matching diminished  perfusion in RIGHT lower lobe and LEFT apex. Mild elevation of RIGHT diaphragm. Larger perfusion defect than ventilation defect at lateral aspect of lingula. Chest CT: RIGHT upper lobe infiltrate. Subsegmental atelectasis BILATERAL lower lobes greater on RIGHT. Minimal subsegmental atelectasis in lingula. IMPRESSION: Triple matched abnormal ventilation, perfusion and radiographic abnormalities in the RIGHT lower lobe. Slightly larger perfusion defect and ventilation defect at the lateral aspect of the lingula, with only minimal subsegmental atelectasis in the lingula on prior CT. Findings represent an intermediate probability for pulmonary embolism. Electronically Signed   By: Lavonia Dana M.D.   On: 03/25/2019 16:22   Dg Chest Portable 1 View  Result Date: 03/24/2019 CLINICAL DATA:  Cough and right chest pain since yesterday. EXAM: PORTABLE CHEST 1 VIEW COMPARISON:  PA and lateral chest 09/05/2016. FINDINGS: Lung volumes are low but the lungs are clear. Elevation the right hemidiaphragm relative to the left noted. Heart size is upper normal. No pneumothorax or pleural fluid. IMPRESSION: No acute disease. Electronically Signed   By: Inge Rise M.D.   On: 03/24/2019 16:39   Ct Renal Stone  Study  Result Date: 03/24/2019 CLINICAL DATA:  Cough, right side pain. EXAM: CT ABDOMEN AND PELVIS WITHOUT CONTRAST TECHNIQUE: Multidetector CT imaging of the abdomen and pelvis was performed following the standard protocol without IV contrast. COMPARISON:  03/02/2019 FINDINGS: Lower chest: Airspace opacities in the right lower lobe concerning for pneumonia. Left base atelectasis. Trace right effusion. Hepatobiliary: No focal hepatic abnormality. Gallbladder unremarkable. Pancreas: No focal abnormality or ductal dilatation. Spleen: No focal abnormality.  Normal size. Adrenals/Urinary Tract: Multiple left renal stones are noted. Elongated stone is seen within the proximal left ureter with a cross-sectional diameter of 4  mm, extending over approximately 2 cm length. There is mild left hydronephrosis. Small nonobstructing stone in the lower pole of the right kidney. No hydronephrosis on the right. Urinary bladder and adrenal glands unremarkable. Stomach/Bowel: Sigmoid diverticulosis. No active diverticulitis. Normal appendix. Stomach and small bowel decompressed, unremarkable. Vascular/Lymphatic: Aortic atherosclerosis. No enlarged abdominal or pelvic lymph nodes. Reproductive: No visible focal abnormality. Other: No free fluid or free air. Musculoskeletal: No acute bony abnormality. IMPRESSION: Airspace consolidation in the right lower lobe with trace right effusion. Findings concerning for pneumonia. Bilateral nephrolithiasis, left greater than right. 20 x 4 mm elongated stone or stones seen within the proximal left ureter with mild left hydronephrosis. Aortic atherosclerosis. Electronically Signed   By: Rolm Baptise M.D.   On: 03/24/2019 19:56   Vas [place] Lower Extremity Venous (dvt)  Result Date: 03/26/2019  Lower Venous Study Indications: Edema.  Risk Factors: None identified. Comparison Study: No prior studies. Performing Technologist: Oliver Hum RVT  Examination Guidelines: A complete evaluation includes B-mode imaging, spectral Doppler, color Doppler, and power Doppler as needed of all accessible portions of each vessel. Bilateral testing is considered an integral part of a complete examination. Limited examinations for reoccurring indications may be performed as noted.  +---------+---------------+---------+-----------+----------+--------------+ RIGHT    CompressibilityPhasicitySpontaneityPropertiesThrombus Aging +---------+---------------+---------+-----------+----------+--------------+ CFV      Full           Yes      Yes                                 +---------+---------------+---------+-----------+----------+--------------+ SFJ      Full                                                         +---------+---------------+---------+-----------+----------+--------------+ FV Prox  Full                                                        +---------+---------------+---------+-----------+----------+--------------+ FV Mid   Full                                                        +---------+---------------+---------+-----------+----------+--------------+ FV DistalFull                                                        +---------+---------------+---------+-----------+----------+--------------+  PFV      Full                                                        +---------+---------------+---------+-----------+----------+--------------+ POP      Full           Yes      Yes                                 +---------+---------------+---------+-----------+----------+--------------+ PTV      Partial                                      Acute          +---------+---------------+---------+-----------+----------+--------------+ PERO     Full                                                        +---------+---------------+---------+-----------+----------+--------------+   +---------+---------------+---------+-----------+----------+--------------+ LEFT     CompressibilityPhasicitySpontaneityPropertiesThrombus Aging +---------+---------------+---------+-----------+----------+--------------+ CFV      Full           Yes      Yes                                 +---------+---------------+---------+-----------+----------+--------------+ SFJ      Full                                                        +---------+---------------+---------+-----------+----------+--------------+ FV Prox  Full                                                        +---------+---------------+---------+-----------+----------+--------------+ FV Mid   Full                                                         +---------+---------------+---------+-----------+----------+--------------+ FV DistalFull                                                        +---------+---------------+---------+-----------+----------+--------------+ PFV      Full                                                        +---------+---------------+---------+-----------+----------+--------------+  POP      Partial        Yes      Yes                  Acute          +---------+---------------+---------+-----------+----------+--------------+ PTV      None                                                        +---------+---------------+---------+-----------+----------+--------------+ PERO     None                                                        +---------+---------------+---------+-----------+----------+--------------+     Summary: Right: Findings consistent with acute deep vein thrombosis involving the right posterior tibial veins. No cystic structure found in the popliteal fossa. Left: Findings consistent with acute deep vein thrombosis involving the left popliteal vein. No cystic structure found in the popliteal fossa.  *See table(s) above for measurements and observations. Electronically signed by Harold Barban MD on 03/26/2019 at 2:14:37 PM.    Final     PATHOLOGY: I have reviewed the pathology reports as documented in the oncologist history.

## 2019-04-17 ENCOUNTER — Telehealth: Payer: Self-pay | Admitting: *Deleted

## 2019-04-17 ENCOUNTER — Inpatient Hospital Stay: Payer: Medicare Other | Attending: Hematology

## 2019-04-17 ENCOUNTER — Encounter: Payer: Self-pay | Admitting: Hematology

## 2019-04-17 ENCOUNTER — Inpatient Hospital Stay (HOSPITAL_BASED_OUTPATIENT_CLINIC_OR_DEPARTMENT_OTHER): Payer: Medicare Other | Admitting: Hematology

## 2019-04-17 ENCOUNTER — Other Ambulatory Visit: Payer: Self-pay

## 2019-04-17 ENCOUNTER — Telehealth: Payer: Self-pay | Admitting: Hematology

## 2019-04-17 VITALS — BP 102/38 | HR 65 | Temp 97.8°F | Resp 17 | Ht 68.0 in | Wt 209.4 lb

## 2019-04-17 DIAGNOSIS — R911 Solitary pulmonary nodule: Secondary | ICD-10-CM

## 2019-04-17 DIAGNOSIS — N184 Chronic kidney disease, stage 4 (severe): Secondary | ICD-10-CM

## 2019-04-17 DIAGNOSIS — I82441 Acute embolism and thrombosis of right tibial vein: Secondary | ICD-10-CM | POA: Diagnosis present

## 2019-04-17 DIAGNOSIS — I2699 Other pulmonary embolism without acute cor pulmonale: Secondary | ICD-10-CM

## 2019-04-17 DIAGNOSIS — D649 Anemia, unspecified: Secondary | ICD-10-CM

## 2019-04-17 DIAGNOSIS — E1122 Type 2 diabetes mellitus with diabetic chronic kidney disease: Secondary | ICD-10-CM | POA: Insufficient documentation

## 2019-04-17 DIAGNOSIS — Z7901 Long term (current) use of anticoagulants: Secondary | ICD-10-CM

## 2019-04-17 DIAGNOSIS — I824Z3 Acute embolism and thrombosis of unspecified deep veins of distal lower extremity, bilateral: Secondary | ICD-10-CM

## 2019-04-17 DIAGNOSIS — I82432 Acute embolism and thrombosis of left popliteal vein: Secondary | ICD-10-CM

## 2019-04-17 LAB — CBC WITH DIFFERENTIAL (CANCER CENTER ONLY)
Abs Immature Granulocytes: 0.03 10*3/uL (ref 0.00–0.07)
Basophils Absolute: 0.1 10*3/uL (ref 0.0–0.1)
Basophils Relative: 1 %
Eosinophils Absolute: 0 10*3/uL (ref 0.0–0.5)
Eosinophils Relative: 1 %
HCT: 35.8 % — ABNORMAL LOW (ref 39.0–52.0)
Hemoglobin: 10.8 g/dL — ABNORMAL LOW (ref 13.0–17.0)
Immature Granulocytes: 1 %
Lymphocytes Relative: 14 %
Lymphs Abs: 0.9 10*3/uL (ref 0.7–4.0)
MCH: 26.2 pg (ref 26.0–34.0)
MCHC: 30.2 g/dL (ref 30.0–36.0)
MCV: 86.7 fL (ref 80.0–100.0)
Monocytes Absolute: 0.5 10*3/uL (ref 0.1–1.0)
Monocytes Relative: 7 %
Neutro Abs: 4.9 10*3/uL (ref 1.7–7.7)
Neutrophils Relative %: 76 %
Platelet Count: 342 10*3/uL (ref 150–400)
RBC: 4.13 MIL/uL — ABNORMAL LOW (ref 4.22–5.81)
RDW: 15.1 % (ref 11.5–15.5)
WBC Count: 6.3 10*3/uL (ref 4.0–10.5)
nRBC: 0 % (ref 0.0–0.2)

## 2019-04-17 LAB — CMP (CANCER CENTER ONLY)
ALT: 48 U/L — ABNORMAL HIGH (ref 0–44)
AST: 12 U/L — ABNORMAL LOW (ref 15–41)
Albumin: 4.2 g/dL (ref 3.5–5.0)
Alkaline Phosphatase: 99 U/L (ref 38–126)
Anion gap: 12 (ref 5–15)
BUN: 73 mg/dL — ABNORMAL HIGH (ref 8–23)
CO2: 12 mmol/L — ABNORMAL LOW (ref 22–32)
Calcium: 10 mg/dL (ref 8.9–10.3)
Chloride: 111 mmol/L (ref 98–111)
Creatinine: 3.9 mg/dL (ref 0.61–1.24)
GFR, Est AFR Am: 17 mL/min — ABNORMAL LOW (ref >60)
GFR, Estimated: 14 mL/min — ABNORMAL LOW (ref >60)
Glucose, Bld: 242 mg/dL — ABNORMAL HIGH (ref 70–99)
Potassium: 4.9 mmol/L (ref 3.5–5.1)
Sodium: 135 mmol/L (ref 135–145)
Total Bilirubin: 0.6 mg/dL (ref 0.3–1.2)
Total Protein: 7.3 g/dL (ref 6.5–8.1)

## 2019-04-17 LAB — D-DIMER, QUANTITATIVE: D-Dimer, Quant: 0.41 ug/mL-FEU (ref 0.00–0.50)

## 2019-04-17 LAB — SAVE SMEAR(SSMR), FOR PROVIDER SLIDE REVIEW

## 2019-04-17 MED ORDER — APIXABAN 5 MG PO TABS: 5.0000 mg | ORAL_TABLET | Freq: Two times a day (BID) | ORAL | 4 refills | Status: DC

## 2019-04-17 NOTE — Telephone Encounter (Signed)
Appointments scheduled calendar printed per 10/15 los °

## 2019-04-17 NOTE — Telephone Encounter (Signed)
Dr. Maylon Peppers notified of creat-3.90.  Order received for pt to receive one liter of IVF's today.  Pt states that he is not able to stay for IVF's today or tomorrow and can come in on Monday, 04/21/19 for IVF's.  Scheduling notified.  Dr. Maylon Peppers aware.

## 2019-04-17 NOTE — Progress Notes (Signed)
Subjective:    Patient ID: Noah Barnes, male    DOB: [birth year], 73 y.o.   MRN: [id number]  HPI The patient is here for an acute visit.  He is taking all his medications as prescribed.  His sugars have been well controlled at home.  He has a decreased appetite and has not been eating as much. He has lost weight.  He is fatigued/has low energy.    AKD:  He had blood work yesterday with hematology. His kidney function is much worse.  He was advised to follow up with me today to have it rechecked.  He was referred to nephrology.    He states he may not be drinking as much as he should.    He is scheduled for IV fluids on Monday by hem/onc.     Medications and allergies reviewed with patient and updated if appropriate.  Patient Active Problem List   Diagnosis Date Noted  . Abnormal findings on diagnostic imaging of lung 04/04/2019  . DVT of lower extremity, bilateral ([place]) 04/03/2019  . Pulmonary embolism ([place]) 04/03/2019  . Hypomagnesemia 04/03/2019  . HCAP (healthcare-associated pneumonia) 03/24/2019  . Nephrolithiasis 03/06/2019  . Right flank pain 07/03/2018  . Acute right-sided low back pain without sciatica 07/03/2018  . Seasonal allergic rhinitis 05/22/2018  . Paronychia of great toe of left foot 05/22/2018  . MR (mitral regurgitation) 12/20/2017  . CKD (chronic kidney disease) stage 3, GFR 30-59 ml/min 08/14/2017  . Abdominal hernia without obstruction and without gangrene 01/10/2016  . Chronic diastolic heart failure ([place]) 02/25/2015  . Right renal mass 02/25/2015  . Acute on chronic kidney failure ([place]) 02/24/2015  . Other constipation 02/22/2015  . Chronic venous insufficiency 01/18/2015  . Superficial phlebitis 01/18/2015  . IBS (irritable bowel syndrome) 11/12/2013  . Family history of prostate cancer 12/26/2011  . Diabetes mellitus with renal complications ([place]) 39/09/90  . Gout 12/31/2007  . HYPERLIPIDEMIA 08/14/2007  . ERECTILE DYSFUNCTION 05/09/2007   . Abnormal electrocardiogram (ECG) (EKG) 05/09/2007  . Essential hypertension 11/28/2006    Current Outpatient Medications on File Prior to Visit  Medication Sig Dispense Refill  . allopurinol (ZYLOPRIM) 300 MG tablet Take 1 tablet (300 mg total) by mouth daily. 90 tablet 1  . apixaban (ELIQUIS) 5 MG TABS tablet Take 1 tablet (5 mg total) by mouth 2 (two) times daily. 60 tablet 4  . atorvastatin (LIPITOR) 20 MG tablet Take 1 tablet by mouth once daily (Patient taking differently: Take 20 mg by mouth daily. ) 90 tablet 0  . cloNIDine (CATAPRES) 0.1 MG tablet Take 1 tablet (0.1 mg total) by mouth 2 (two) times daily. 180 tablet 0  . glimepiride (AMARYL) 2 MG tablet Take 1-1/2 tablets before dinner 45 tablet 1  . hydrALAZINE (APRESOLINE) 50 MG tablet Take 1 tablet (50 mg total) by mouth 2 (two) times daily. 60 tablet 0  . iron polysaccharides (NIFEREX) 150 MG capsule Take 1 capsule (150 mg total) by mouth daily. 30 capsule 0  . Lancets (ONETOUCH DELICA PLUS [id number]) MISC USE AS DIRECTED TWICE A DAILY (Patient taking differently: 1 application by Other route 2 (two) times daily. ) 100 each 0  . LINZESS 290 MCG CAPS capsule TAKE 1 CAPSULE BY MOUTH ONCE DAILY BEFORE BREAKFAST (Patient taking differently: Take 290 mcg by mouth daily before breakfast. ) 30 capsule 1  . metoprolol tartrate (LOPRESSOR) 100 MG tablet Take 1 tablet by mouth twice daily 180 tablet 0  . ONETOUCH ULTRA test  strip USE TO TEST TWICE DAILY (Patient taking differently: 1 each by Other route 2 (two) times daily. ) 100 strip 3  . oxyCODONE-acetaminophen (PERCOCET) 5-325 MG tablet Take 1 tablet by mouth every 4 (four) hours as needed for moderate pain or severe pain. 30 tablet 0  . sildenafil (VIAGRA) 100 MG tablet Take 1 tablet (100 mg total) by mouth as needed for erectile dysfunction. 6 tablet 1  . sitaGLIPtin (JANUVIA) 50 MG tablet Take 1 tablet (50 mg total) by mouth daily. 30 tablet 0  . tamsulosin (FLOMAX) 0.4 MG CAPS  capsule Take 1 capsule (0.4 mg total) by mouth daily after supper. 30 capsule 0  . traMADol (ULTRAM) 50 MG tablet Take 50-100 mg by mouth every 6 (six) hours as needed.     No current facility-administered medications on file prior to visit.     Past Medical History:  Diagnosis Date  . CHF (congestive heart failure) ([place])   . Chronic diastolic heart failure ([place])   . Constipation   . Diabetes mellitus   . Diverticulosis   . ED (erectile dysfunction)   . Gout   . Heart murmur   . Hyperlipidemia   . Hypertension   . IBS (irritable bowel syndrome)   . Renal calculus   . Renal insufficiency   . Tubular adenoma of colon     Past Surgical History:  Procedure Laterality Date  . COLONOSCOPY  1998   negative; no F/U  . EXTRACORPOREAL SHOCK WAVE LITHOTRIPSY Left 03/20/2019   Procedure: EXTRACORPOREAL SHOCK WAVE LITHOTRIPSY (ESWL);  Surgeon: Cleon Gustin, MD;  Location: [facility];  Service: Urology;  Laterality: Left;  . knee effusion tapped      Social History   Socioeconomic History  . Marital status: Married    Spouse name: Not on file  . Number of children: 1  . Years of education: Not on file  . Highest education level: Not on file  Occupational History  . Occupation: Metallurgist: [employer]: [phone]  Social Needs  . Financial resource strain: Not hard at all  . Food insecurity    Worry: Never true    Inability: Never true  . Transportation needs    Medical: Yes    Non-medical: Yes  Tobacco Use  . Smoking status: Former Smoker    Packs/day: 0.30    Types: Cigarettes    Quit date: 07/04/1991    Years since quitting: 27.8  . Smokeless tobacco: Never Used  . Tobacco comment: smoked age [age], up to 1/3 ppd; may be less  Substance and Sexual Activity  . Alcohol use: Yes    Alcohol/week: 2.0 standard drinks    Types: 2 Glasses of wine per week    Comment: rare  . Drug use: No  . Sexual activity: Yes  Lifestyle   . Physical activity    Days per week: 0 days    Minutes per session: 0 min  . Stress: Not at all  Relationships  . Social connections    Talks on phone: More than three times a week    Gets together: More than three times a week    Attends religious service: More than 4 times per year    Active member of club or organization: Not on file    Attends meetings of clubs or organizations: More than 4 times per year    Relationship status: Married  Other Topics Concern  .  Not on file  Social History Narrative  . Not on file    Family History  Problem Relation Age of Onset  . Heart attack Mother 71  . Hypertension Mother   . Prostate cancer Father 17  . Diabetes Father   . Irritable bowel syndrome Father   . Colon cancer Brother        dx in his late 1's  . Irritable bowel syndrome Sister        x 2  . Irritable bowel syndrome Brother   . Stroke Neg Hx   . Esophageal cancer Neg Hx   . Rectal cancer Neg Hx   . Stomach cancer Neg Hx     Review of Systems  Constitutional: Positive for appetite change (decreased), fatigue and unexpected weight change.  Respiratory: Negative for cough, shortness of breath and wheezing.   Cardiovascular: Negative for chest pain, palpitations and leg swelling.  Gastrointestinal: Positive for constipation. Negative for abdominal pain and nausea.  Genitourinary:       No change in urination  Skin:       No skin itching  Neurological: Negative for light-headedness and headaches.  Psychiatric/Behavioral: Negative for confusion.       Objective:   Vitals:   04/18/19 1454  BP: 104/68  Pulse: (!) 55  Temp: 98.6 F (37 C)  SpO2: 97%   BP Readings from Last 3 Encounters:  04/18/19 104/68  04/17/19 (!) 102/38  04/08/19 110/60   Wt Readings from Last 3 Encounters:  04/18/19 208 lb (94.3 kg)  04/17/19 209 lb 6.4 oz (95 kg)  04/08/19 217 lb 9.6 oz (98.7 kg)   Body mass index is 31.63 kg/m.   Physical Exam    Constitutional: Appears  well-developed and well-nourished. No distress.  HENT:  Head: Normocephalic and atraumatic.  Neck: Neck supple. No tracheal deviation present. No thyromegaly present.  No cervical lymphadenopathy Cardiovascular: Normal rate, regular rhythm and normal heart sounds.  No murmur heard. No carotid bruit .  1+ b/l LE pitting edema Pulmonary/Chest: Effort normal and breath sounds normal. No respiratory distress. No has no wheezes. No rales.  Skin: Skin is warm and dry. Not diaphoretic.  Psychiatric: Normal mood and affect. Behavior is normal.     Estimated Creatinine Clearance: 21 mL/min (A) (by C-G formula based on SCr of 3.55 mg/dL (H)).   Assessment & Plan:    See Problem List for Assessment and Plan of chronic medical problems.

## 2019-04-18 ENCOUNTER — Ambulatory Visit (INDEPENDENT_AMBULATORY_CARE_PROVIDER_SITE_OTHER): Payer: Medicare Other | Admitting: Internal Medicine

## 2019-04-18 ENCOUNTER — Encounter: Payer: Self-pay | Admitting: Internal Medicine

## 2019-04-18 ENCOUNTER — Other Ambulatory Visit (INDEPENDENT_AMBULATORY_CARE_PROVIDER_SITE_OTHER): Payer: Medicare Other

## 2019-04-18 VITALS — BP 104/68 | HR 55 | Temp 98.6°F | Ht 68.0 in | Wt 208.0 lb

## 2019-04-18 DIAGNOSIS — I10 Essential (primary) hypertension: Secondary | ICD-10-CM | POA: Diagnosis not present

## 2019-04-18 DIAGNOSIS — E1129 Type 2 diabetes mellitus with other diabetic kidney complication: Secondary | ICD-10-CM

## 2019-04-18 DIAGNOSIS — N189 Chronic kidney disease, unspecified: Secondary | ICD-10-CM

## 2019-04-18 DIAGNOSIS — N179 Acute kidney failure, unspecified: Secondary | ICD-10-CM | POA: Diagnosis not present

## 2019-04-18 LAB — BASIC METABOLIC PANEL
BUN: 65 mg/dL — ABNORMAL HIGH (ref 6–23)
CO2: 14 mEq/L — ABNORMAL LOW (ref 19–32)
Calcium: 9.8 mg/dL (ref 8.4–10.5)
Chloride: 114 mEq/L — ABNORMAL HIGH (ref 96–112)
Creatinine, Ser: 3.55 mg/dL — ABNORMAL HIGH (ref 0.40–1.50)
GFR: 20.58 mL/min — ABNORMAL LOW (ref >60.00)
Glucose, Bld: 137 mg/dL — ABNORMAL HIGH (ref 70–99)
Potassium: 4.7 mEq/L (ref 3.5–5.1)
Sodium: 136 mEq/L (ref 135–145)

## 2019-04-18 NOTE — Patient Instructions (Addendum)
Monitor your BP.  If your BP is more than 130/80 then restart the clonidine.     Have blood work done today.

## 2019-04-20 NOTE — Assessment & Plan Note (Signed)
Following with Dr Dwyane Dee Sugars well controlled at home Continue current medications

## 2019-04-20 NOTE — Assessment & Plan Note (Signed)
Worsening kidney function Referred to nephrology Recheck bmp today Scheduled for IV fluids on Monday by hem/onc

## 2019-04-20 NOTE — Assessment & Plan Note (Signed)
BP has been on the lower side Hold clonidine for now Monitor BP at home - restart if BP > 130/80

## 2019-04-21 ENCOUNTER — Other Ambulatory Visit: Payer: Self-pay

## 2019-04-21 ENCOUNTER — Inpatient Hospital Stay: Payer: Medicare Other

## 2019-04-21 VITALS — BP 124/69 | HR 53 | Temp 97.5°F | Resp 17

## 2019-04-21 DIAGNOSIS — N183 Chronic kidney disease, stage 3 unspecified: Secondary | ICD-10-CM

## 2019-04-21 DIAGNOSIS — I82441 Acute embolism and thrombosis of right tibial vein: Secondary | ICD-10-CM | POA: Diagnosis not present

## 2019-04-21 MED ORDER — SODIUM CHLORIDE 0.9 % IV SOLN
Freq: Once | INTRAVENOUS | Status: AC
  Administered 2019-04-21: 13:00:00 via INTRAVENOUS
  Filled 2019-04-21: qty 250

## 2019-04-21 NOTE — Patient Instructions (Signed)
Dehydration, Adult  Dehydration is when there is not enough fluid or water in your body. This happens when you lose more fluids than you take in. Dehydration can range from mild to very bad. It should be treated right away to keep it from getting very bad. Symptoms of mild dehydration may include:  Thirst.  Dry lips.  Slightly dry mouth.  Dry, warm skin.  Dizziness. Symptoms of moderate dehydration may include:  Very dry mouth.  Muscle cramps.  Dark pee (urine). Pee may be the color of tea.  Your body making less pee.  Your eyes making fewer tears.  Heartbeat that is uneven or faster than normal (palpitations).  Headache.  Light-headedness, especially when you stand up from sitting.  Fainting (syncope). Symptoms of very bad dehydration may include:  Changes in skin, such as: ? Cold and clammy skin. ? Blotchy (mottled) or pale skin. ? Skin that does not quickly return to normal after being lightly pinched and let go (poor skin turgor).  Changes in body fluids, such as: ? Feeling very thirsty. ? Your eyes making fewer tears. ? Not sweating when body temperature is high, such as in hot weather. ? Your body making very little pee.  Changes in vital signs, such as: ? Weak pulse. ? Pulse that is more than 100 beats a minute when you are sitting still. ? Fast breathing. ? Low blood pressure.  Other changes, such as: ? Sunken eyes. ? Cold hands and feet. ? Confusion. ? Lack of energy (lethargy). ? Trouble waking up from sleep. ? Short-term weight loss. ? Unconsciousness. Follow these instructions at home:   If told by your doctor, drink an ORS: ? Make an ORS by using instructions on the package. ? Start by drinking small amounts, about  cup (120 mL) every 5-10 minutes. ? Slowly drink more until you have had the amount that your doctor said to have.  Drink enough clear fluid to keep your pee clear or pale yellow. If you were told to drink an ORS, finish the  ORS first, then start slowly drinking clear fluids. Drink fluids such as: ? Water. Do not drink only water by itself. Doing that can make the salt (sodium) level in your body get too low (hyponatremia). ? Ice chips. ? Fruit juice that you have added water to (diluted). ? Low-calorie sports drinks.  Avoid: ? Alcohol. ? Drinks that have a lot of sugar. These include high-calorie sports drinks, fruit juice that does not have water added, and soda. ? Caffeine. ? Foods that are greasy or have a lot of fat or sugar.  Take over-the-counter and prescription medicines only as told by your doctor.  Do not take salt tablets. Doing that can make the salt level in your body get too high (hypernatremia).  Eat foods that have minerals (electrolytes). Examples include bananas, oranges, potatoes, tomatoes, and spinach.  Keep all follow-up visits as told by your doctor. This is important. Contact a doctor if:  You have belly (abdominal) pain that: ? Gets worse. ? Stays in one area (localizes).  You have a rash.  You have a stiff neck.  You get angry or annoyed more easily than normal (irritability).  You are more sleepy than normal.  You have a harder time waking up than normal.  You feel: ? Weak. ? Dizzy. ? Very thirsty.  You have peed (urinated) only a small amount of very dark pee during 6-8 hours. Get help right away if:  You have   symptoms of very bad dehydration.  You cannot drink fluids without throwing up (vomiting).  Your symptoms get worse with treatment.  You have a fever.  You have a very bad headache.  You are throwing up or having watery poop (diarrhea) and it: ? Gets worse. ? Does not go away.  You have blood or something green (bile) in your throw-up.  You have blood in your poop (stool). This may cause poop to look black and tarry.  You have not peed in 6-8 hours.  You pass out (faint).  Your heart rate when you are sitting still is more than 100 beats a  minute.  You have trouble breathing. This information is not intended to replace advice given to you by your health care provider. Make sure you discuss any questions you have with your health care provider. Document Released: 04/15/2009 Document Revised: 06/01/2017 Document Reviewed: 08/13/2015 Elsevier Patient Education  2020 Elsevier Inc.  

## 2019-04-22 ENCOUNTER — Other Ambulatory Visit: Payer: Self-pay

## 2019-04-22 MED ORDER — SITAGLIPTIN PHOSPHATE 50 MG PO TABS: 50.0000 mg | ORAL_TABLET | Freq: Every day | ORAL | 2 refills | Status: DC

## 2019-04-23 ENCOUNTER — Telehealth: Payer: Self-pay | Admitting: Internal Medicine

## 2019-04-23 DIAGNOSIS — I824Z3 Acute embolism and thrombosis of unspecified deep veins of distal lower extremity, bilateral: Secondary | ICD-10-CM

## 2019-04-23 MED ORDER — APIXABAN 5 MG PO TABS: 5.0000 mg | ORAL_TABLET | Freq: Two times a day (BID) | ORAL | 3 refills | Status: DC

## 2019-04-23 MED ORDER — POLYSACCHARIDE IRON COMPLEX 150 MG PO CAPS: 150.0000 mg | ORAL_CAPSULE | Freq: Every day | ORAL | 0 refills | Status: DC

## 2019-04-23 MED ORDER — TAMSULOSIN HCL 0.4 MG PO CAPS: 0.4000 mg | ORAL_CAPSULE | Freq: Every day | ORAL | 0 refills | Status: DC

## 2019-04-23 NOTE — Telephone Encounter (Signed)
Pt says that he was told by PCP to remind her to set up a referral to a kidney specialist for pt.    Please assist pt directly.

## 2019-04-23 NOTE — Telephone Encounter (Signed)
Pt was asking about the referral. I told him to wait a few more days because referrals take some time. Pt expressed understanding.

## 2019-04-23 NOTE — Telephone Encounter (Signed)
Yes, okay to fill

## 2019-04-23 NOTE — Telephone Encounter (Signed)
Ok to fill these?

## 2019-04-23 NOTE — Telephone Encounter (Signed)
It looks like you already did a referral. Do you know what he is talking about by chance?

## 2019-04-23 NOTE — Telephone Encounter (Signed)
Medication Refill - Medication: apixaban (ELIQUIS) 5 MG TABS tablet  iron polysaccharides (NIFEREX) 150 MG capsule   tamsulosin (FLOMAX) 0.4 MG CAPS capsule Pt needs a refill of these and some are from the hospital/ please advise   Has the patient contacted their pharmacy? Yes.   (Agent: If no, request that the patient contact the pharmacy for the refill.) (Agent: If yes, when and what did the pharmacy advise?)  Preferred Pharmacy (with phone number or street name):  [facility], [place]. [phone] (Phone) [phone] (Fax)     Agent: Please be advised that RX refills may take up to 3 business days. We ask that you follow-up with your pharmacy.

## 2019-05-12 ENCOUNTER — Other Ambulatory Visit: Payer: Self-pay | Admitting: Endocrinology

## 2019-05-15 ENCOUNTER — Other Ambulatory Visit (INDEPENDENT_AMBULATORY_CARE_PROVIDER_SITE_OTHER): Payer: Medicare Other

## 2019-05-15 ENCOUNTER — Other Ambulatory Visit: Payer: Self-pay

## 2019-05-15 ENCOUNTER — Other Ambulatory Visit: Payer: Self-pay | Admitting: Internal Medicine

## 2019-05-15 DIAGNOSIS — E1165 Type 2 diabetes mellitus with hyperglycemia: Secondary | ICD-10-CM

## 2019-05-15 DIAGNOSIS — E119 Type 2 diabetes mellitus without complications: Secondary | ICD-10-CM

## 2019-05-15 LAB — MICROALBUMIN / CREATININE URINE RATIO
Creatinine,U: 141.5 mg/dL
Microalb Creat Ratio: 2.5 mg/g (ref 0.0–30.0)
Microalb, Ur: 3.5 mg/dL — ABNORMAL HIGH (ref 0.0–1.9)

## 2019-05-15 LAB — COMPREHENSIVE METABOLIC PANEL
ALT: 85 U/L — ABNORMAL HIGH (ref 0–53)
AST: 24 U/L (ref 0–37)
Albumin: 3.9 g/dL (ref 3.5–5.2)
Alkaline Phosphatase: 64 U/L (ref 39–117)
BUN: 34 mg/dL — ABNORMAL HIGH (ref 6–23)
CO2: 19 mEq/L (ref 19–32)
Calcium: 9.2 mg/dL (ref 8.4–10.5)
Chloride: 114 mEq/L — ABNORMAL HIGH (ref 96–112)
Creatinine, Ser: 1.9 mg/dL — ABNORMAL HIGH (ref 0.40–1.50)
GFR: 42.33 mL/min — ABNORMAL LOW (ref >60.00)
Glucose, Bld: 161 mg/dL — ABNORMAL HIGH (ref 70–99)
Potassium: 4.7 mEq/L (ref 3.5–5.1)
Sodium: 139 mEq/L (ref 135–145)
Total Bilirubin: 0.5 mg/dL (ref 0.2–1.2)
Total Protein: 6.7 g/dL (ref 6.0–8.3)

## 2019-05-15 LAB — LIPID PANEL
Cholesterol: 117 mg/dL (ref 0–200)
HDL: 40.7 mg/dL (ref >39.00)
LDL Cholesterol: 61 mg/dL (ref 0–99)
NonHDL: 76.69
Total CHOL/HDL Ratio: 3
Triglycerides: 80 mg/dL (ref 0.0–149.0)
VLDL: 16 mg/dL (ref 0.0–40.0)

## 2019-05-15 LAB — HEMOGLOBIN A1C: Hgb A1c MFr Bld: 6.4 % (ref 4.6–6.5)

## 2019-05-16 LAB — FRUCTOSAMINE: Fructosamine: 263 umol/L (ref 0–285)

## 2019-05-21 ENCOUNTER — Encounter: Payer: Self-pay | Admitting: Endocrinology

## 2019-05-21 ENCOUNTER — Ambulatory Visit: Payer: Medicare Other | Admitting: Endocrinology

## 2019-05-21 VITALS — BP 110/60 | HR 69 | Ht 68.0 in | Wt 210.0 lb

## 2019-05-21 DIAGNOSIS — E669 Obesity, unspecified: Secondary | ICD-10-CM | POA: Diagnosis not present

## 2019-05-21 DIAGNOSIS — E1169 Type 2 diabetes mellitus with other specified complication: Secondary | ICD-10-CM | POA: Diagnosis not present

## 2019-05-21 MED ORDER — GLIMEPIRIDE 4 MG PO TABS: 4.0000 mg | ORAL_TABLET | Freq: Every day | ORAL | 3 refills | Status: DC

## 2019-05-21 NOTE — Patient Instructions (Addendum)
INDOOR EXERCISE IDEAS   Use the following examples for a creative indoor workout (perform each move for 2-3 minutes):   Warm up. Put on some music that makes you feel like moving, and dance around the living room.  Watch exercise shows on TV and move along with them. There are tons of free cable channels that have daily exercise shows on them for all levels - beginner through advanced.   You can easily find a number of exercise videos but use one that will suit your liking and exercise level; you can do these on your own schedule.  Walk up and down the steps.  Do dumbbell curls and presses (if you don't have weights, use full water bottles).  Do assisted squats, keeping your back on a fitness ball against the wall or using the back of the couch for support.  Shadow box: Lift and lower the left leg; jab with the right arm, then the left; then lift and lower the right leg.  Fence (you don't even need swords). Pretend you're holding a sword in each hand. Create an X pattern standing still, then moving forward and back.  Hop on your exercise bike or treadmill -- or, for something different, use a weighted hula hoop. If you don't have any of those, just go back to dancing.  Do abdominal crunches (hold a weighted ball for added resistance).   Cool down with [employer] "I Feel Good" -- or whatever tune makes you feel good  Check blood sugars on waking up days a week  Also check blood sugars about 2 hours after meals and do this after different meals by rotation  Recommended blood sugar levels on waking up are 90-130 and about 2 hours after meal is 130-180  Please bring your blood sugar monitor to each visit, thank you

## 2019-05-21 NOTE — Progress Notes (Addendum)
Patient ID: Noah Barnes, male   DOB: [birth year], 73 y.o.   MRN: [id number]    Reason for Appointment : Follow-up visit  History of Present Illness          Diagnosis: Type 2 diabetes mellitus, date of diagnosis: 2008       Past history: His diabetes was borderline in the beginning and not clear what medications he was started on. Has been taking metformin for several years and this was later changed to [place]. Amaryl probably started in 5/14 when blood sugars were higher, initially was given 1 mg and now has been taking 2 mg. He had a relatively high A1c of 7.1, difficulty losing weight and a glucose of 202 on his initial consultation in 06/2013 He was then tried on Invokana in addition to his Janumet and Amaryl Invokana was reduced to 100 mg in early 2015 been creatinine had gone up to 1.6  Recent history:   Oral hypoglycemic drugs the patient is taking are: Amaryl 4 mg at supper, Januvia 50 mg daily    His A1c is 6.4, previously 6.3 done in early September previous range 5.8-6.5  Current management, blood sugar patterns and problems identified:     On his last visit his blood sugars had been higher in the morning  Because of stopping Metformin he was changed to Januvia 50 mg only  Also his AMARYL was increased; he was told to try 3 mg first and then 4 mg as needed  With this he has improved fasting blood sugars at home and he finds take 4 mg dose more effective  Not clear why his lab glucose was 161 fasting but this was done late morning  At home his blood sugars are nearly normal consistently  No hypoglycemia  Also he has been able to maintain the lower weight, previously had lost weight with his hospitalization  However he still has not been motivated to are energetic enough to start walking again  Side effects from medications have been:  renal dysfunction from Invokana  Glucose monitoring:  done <1 times a day         Glucometer: One Touch    Blood  Glucose readings recently from download for the last 15 days  Recent range 95-152,   AVERAGE 122 with all readings in the mornings  Previous readings:  PRE-MEAL Fasting Lunch Dinner Bedtime Overall  Glucose range:  101-189      Mean/median:      154     Glycemic control:   Lab Results  Component Value Date   HGBA1C 6.4 05/15/2019   HGBA1C 6.3 03/06/2019   HGBA1C 6.5 11/19/2018   Lab Results  Component Value Date   MICROALBUR 3.5 (H) 05/15/2019   [place] 61 05/15/2019   CREATININE 1.90 (H) 05/15/2019    Self-care: Usually has low-fat diet, eggs in am with less carbohydrate, usually no lunch  Exercise:  Only occasionally walking    Dietician consultations: 08/2013              Weight history:   Wt Readings from Last 3 Encounters:  05/21/19 210 lb (95.3 kg)  04/18/19 208 lb (94.3 kg)  04/17/19 209 lb 6.4 oz (95 kg)       Allergies as of 05/21/2019      Reactions   Ace Inhibitors Other (See Comments)   Severe AKI due to ARB + dehydration Aug 2016   Angiotensin Receptor Blockers Other (See Comments)   Severe  AKI due to ARB + dehydration Aug 2016      Medication List       Accurate as of May 21, 2019 10:12 AM. If you have any questions, ask your nurse or doctor.        allopurinol 300 MG tablet Commonly known as: ZYLOPRIM Take 1 tablet (300 mg total) by mouth daily.   apixaban 5 MG Tabs tablet Commonly known as: ELIQUIS Take 1 tablet (5 mg total) by mouth 2 (two) times daily.   atorvastatin 20 MG tablet Commonly known as: LIPITOR Take 1 tablet by mouth once daily   cloNIDine 0.1 MG tablet Commonly known as: CATAPRES Take 1 tablet (0.1 mg total) by mouth 2 (two) times daily.   glimepiride 2 MG tablet Commonly known as: AMARYL Take 1-1/2 tablets before dinner What changed:   how much to take  how to take this  additional instructions   hydrALAZINE 50 MG tablet Commonly known as: APRESOLINE Take 1 tablet (50 mg total) by mouth 2  (two) times daily.   iron polysaccharides 150 MG capsule Commonly known as: NIFEREX Take 1 capsule (150 mg total) by mouth daily.   Linzess 290 MCG Caps capsule Generic drug: linaclotide TAKE 1 CAPSULE BY MOUTH ONCE DAILY BEFORE BREAKFAST   metoprolol tartrate 100 MG tablet Commonly known as: LOPRESSOR Take 1 tablet by mouth twice daily   OneTouch Delica Plus [id number] Misc USE AS DIRECTED TWICE A DAILY   OneTouch Delica Plus [id number] Misc USE AS DIRECTED TWICE A DAILY   OneTouch Ultra test strip Generic drug: glucose blood USE TO TEST TWICE DAILY What changed: See the new instructions.   oxyCODONE-acetaminophen 5-325 MG tablet Commonly known as: Percocet Take 1 tablet by mouth every 4 (four) hours as needed for moderate pain or severe pain.   sildenafil 100 MG tablet Commonly known as: VIAGRA Take 1 tablet (100 mg total) by mouth as needed for erectile dysfunction.   sitaGLIPtin 50 MG tablet Commonly known as: Januvia Take 1 tablet (50 mg total) by mouth daily.   tamsulosin 0.4 MG Caps capsule Commonly known as: Flomax Take 1 capsule (0.4 mg total) by mouth daily after supper.   traMADol 50 MG tablet Commonly known as: ULTRAM Take 50-100 mg by mouth every 6 (six) hours as needed.       Allergies:  Allergies  Allergen Reactions  . Ace Inhibitors Other (See Comments)    Severe AKI due to ARB + dehydration Aug 2016  . Angiotensin Receptor Blockers Other (See Comments)    Severe AKI due to ARB + dehydration Aug 2016    Past Medical History:  Diagnosis Date  . CHF (congestive heart failure) ([place])   . Chronic diastolic heart failure ([place])   . Constipation   . Diabetes mellitus   . Diverticulosis   . ED (erectile dysfunction)   . Gout   . Heart murmur   . Hyperlipidemia   . Hypertension   . IBS (irritable bowel syndrome)   . Renal calculus   . Renal insufficiency   . Tubular adenoma of colon     Past Surgical History:  Procedure Laterality Date   . COLONOSCOPY  1998   negative; no F/U  . EXTRACORPOREAL SHOCK WAVE LITHOTRIPSY Left 03/20/2019   Procedure: EXTRACORPOREAL SHOCK WAVE LITHOTRIPSY (ESWL);  Surgeon: Cleon Gustin, MD;  Location: [facility];  Service: Urology;  Laterality: Left;  . knee effusion tapped      Family History  Problem Relation Age  of Onset  . Heart attack Mother 13  . Hypertension Mother   . Prostate cancer Father 7  . Diabetes Father   . Irritable bowel syndrome Father   . Colon cancer Brother        dx in his late 54's  . Irritable bowel syndrome Sister        x 2  . Irritable bowel syndrome Brother   . Stroke Neg Hx   . Esophageal cancer Neg Hx   . Rectal cancer Neg Hx   . Stomach cancer Neg Hx     Social History:  reports that he quit smoking about 27 years ago. His smoking use included cigarettes. He smoked 0.30 packs per day. He has never used smokeless tobacco. He reports current alcohol use of about 2.0 standard drinks of alcohol per week. He reports that he does not use drugs.    Review of Systems   RENAL dysfunction: Creatinine is relatively higher with recent hospitalization     Lab Results  Component Value Date   CREATININE 1.90 (H) 05/15/2019   CREATININE 3.55 (H) 04/18/2019   CREATININE 3.90 (HH) 04/17/2019   CREATININE 2.16 (H) 03/29/2019     Lab Results  Component Value Date   K 4.7 05/15/2019          Lipids: Has been on Lipitor 20 mg for hyperlipidemia, baseline LDL about 162 Followed by Dr. Quay Burow  Lab Results  Component Value Date   CHOL 117 05/15/2019   HDL 40.70 05/15/2019   LDLCALC 61 05/15/2019   LDLDIRECT 80.1 04/15/2014   TRIG 80.0 05/15/2019   CHOLHDL 3 05/15/2019       The blood pressure has been high for several years and usually well controlled.  Currently taking, hydralazine, clonidine and metoprolol.   BP Readings from Last 3 Encounters:  05/21/19 110/60  04/21/19 124/69  04/18/19 104/68      LABS:  Lab on 05/15/2019  Component  Date Value Ref Range Status  . Fructosamine 05/15/2019 263  0 - 285 umol/L Final   Comment: Published reference interval for apparently healthy subjects between age 86 and 84 is 44 - 285 umol/L and in a poorly controlled diabetic population is 228 - 563 umol/L with a mean of 396 umol/L.   [name] Cholesterol 05/15/2019 117  0 - 200 mg/dL Final   ATP III Classification       Desirable:  < 200 mg/dL               Borderline High:  200 - 239 mg/dL          High:  > = 240 mg/dL  . Triglycerides 05/15/2019 80.0  0.0 - 149.0 mg/dL Final   Normal:  <150 mg/dLBorderline High:  150 - 199 mg/dL  . HDL 05/15/2019 40.70  >39.00 mg/dL Final  . VLDL 05/15/2019 16.0  0.0 - 40.0 mg/dL Final  . LDL Cholesterol 05/15/2019 61  0 - 99 mg/dL Final  . Total CHOL/HDL Ratio 05/15/2019 3   Final                  Men          Women1/2 Average Risk     3.4          3.3Average Risk          5.0          4.42X Average Risk          9.6  7.13X Average Risk          15.0          11.0                      . NonHDL 05/15/2019 76.69   Final   NOTE:  Non-HDL goal should be 30 mg/dL higher than patient's LDL goal (i.e. LDL goal of < 70 mg/dL, would have non-HDL goal of < 100 mg/dL)  . Microalb, Ur 05/15/2019 3.5* 0.0 - 1.9 mg/dL Final  . Creatinine,U 05/15/2019 141.5  mg/dL Final  . Microalb Creat Ratio 05/15/2019 2.5  0.0 - 30.0 mg/g Final  . Sodium 05/15/2019 139  135 - 145 mEq/L Final  . Potassium 05/15/2019 4.7  3.5 - 5.1 mEq/L Final  . Chloride 05/15/2019 114* 96 - 112 mEq/L Final  . CO2 05/15/2019 19  19 - 32 mEq/L Final  . Glucose, Bld 05/15/2019 161* 70 - 99 mg/dL Final  . BUN 05/15/2019 34* 6 - 23 mg/dL Final  . Creatinine, Ser 05/15/2019 1.90* 0.40 - 1.50 mg/dL Final  . Total Bilirubin 05/15/2019 0.5  0.2 - 1.2 mg/dL Final  . Alkaline Phosphatase 05/15/2019 64  39 - 117 U/L Final  . AST 05/15/2019 24  0 - 37 U/L Final  . ALT 05/15/2019 85* 0 - 53 U/L Final  . Total Protein 05/15/2019 6.7  6.0 - 8.3 g/dL  Final  . Albumin 05/15/2019 3.9  3.5 - 5.2 g/dL Final  . GFR 05/15/2019 42.33* >60.00 mL/min Final  . Calcium 05/15/2019 9.2  8.4 - 10.5 mg/dL Final  . Hgb A1c MFr Bld 05/15/2019 6.4  4.6 - 6.5 % Final   Glycemic Control Guidelines for People with Diabetes:Non Diabetic:  <6%Goal of Therapy: <7%Additional Action Suggested:  >8%     Physical Examination:  BP 110/60 (BP Location: Left Arm, Patient Position: Sitting, Cuff Size: Normal)   Pulse 69   Ht 5\' 8"  (1.727 m)   Wt 210 lb (95.3 kg)   SpO2 97%   BMI 31.93 kg/m        ASSESSMENT/PLAN:   Diabetes type 2, with obesity  See history of present illness for detailed discussion of current diabetes management, blood sugar patterns and problems identified  He is now on Januvia 50 mg and Amaryl 4 mg  A1c is about the same at 6.4  Blood sugars are now better controlled with 4 mg Amaryl Also on reduced Januvia because of renal dysfunction Still not able to get back on the Metformin because of his creatinine of 1.9  Although he has not done readings after his meals his A1c likely indicates overall good control Usually eating most of his carbohydrates at dinnertime and taking Amaryl before eating We will send him a prescription for 4 mg Amaryl Reminded him to check readings after dinner also Discussed ways of exercising indoors since he does not want to start walking at other locations or the gym  RENAL dysfunction: Creatinine now under 2  To be followed by nephrologist   HYPERTENSION: As usual blood pressure is low normal but asymptomatic, to follow-up with PCP      There are no Patient Instructions on file for this visit.   Elayne Snare 05/21/2019, 10:12 AM

## 2019-05-23 ENCOUNTER — Telehealth: Payer: Self-pay | Admitting: Internal Medicine

## 2019-05-23 ENCOUNTER — Other Ambulatory Visit: Payer: Self-pay | Admitting: *Deleted

## 2019-05-23 MED ORDER — HYDRALAZINE HCL 50 MG PO TABS: 50.0000 mg | ORAL_TABLET | Freq: Two times a day (BID) | ORAL | 2 refills | Status: DC

## 2019-05-23 NOTE — Telephone Encounter (Signed)
Patient requesting hydrALAZINE (APRESOLINE) 50 MG tablet, patient states pharmacy sent several weeks, patient will run out tomorrow    [place], [place].

## 2019-05-30 ENCOUNTER — Other Ambulatory Visit: Payer: Self-pay | Admitting: Internal Medicine

## 2019-06-18 ENCOUNTER — Other Ambulatory Visit: Payer: Self-pay

## 2019-06-18 MED ORDER — GLIMEPIRIDE 4 MG PO TABS: 4.0000 mg | ORAL_TABLET | Freq: Every day | ORAL | 0 refills | Status: DC

## 2019-07-01 ENCOUNTER — Other Ambulatory Visit: Payer: Self-pay | Admitting: Internal Medicine

## 2019-07-07 ENCOUNTER — Other Ambulatory Visit: Payer: Self-pay | Admitting: Internal Medicine

## 2019-07-08 NOTE — Progress Notes (Signed)
Subjective:    Patient ID: Noah Barnes, male    DOB: [birth year], 74 y.o.   MRN: [id number]  HPI The patient is here for follow up of his chronic medical problems including chronic kidney disease, hypertension, diabetes, hyperlipidemia, gout and history of DVT/PE.  He follows with multiple other physicians.  He is not exercising regularly.   He is taking all of his medications as prescribed.    He has been feeling good.  He has no concerns.     Medications and allergies reviewed with patient and updated if appropriate.  Patient Active Problem List   Diagnosis Date Noted  . Abnormal findings on diagnostic imaging of lung 04/04/2019  . History of DVT of lower extremity, b/l 04/03/2019  . History of pulmonary embolus (PE) 04/03/2019  . Hypomagnesemia 04/03/2019  . HCAP (healthcare-associated pneumonia) 03/24/2019  . Nephrolithiasis 03/06/2019  . Seasonal allergic rhinitis 05/22/2018  . Paronychia of great toe of left foot 05/22/2018  . MR (mitral regurgitation) 12/20/2017  . CKD (chronic kidney disease) stage 3, GFR 30-59 ml/min 08/14/2017  . Abdominal hernia without obstruction and without gangrene 01/10/2016  . Chronic diastolic heart failure ([place]) 02/25/2015  . Right renal mass 02/25/2015  . Other constipation 02/22/2015  . Chronic venous insufficiency 01/18/2015  . Superficial phlebitis 01/18/2015  . IBS (irritable bowel syndrome) 11/12/2013  . Family history of prostate cancer 12/26/2011  . Diabetes mellitus with renal complications ([place]) 70/35/0093  . Gout 12/31/2007  . HYPERLIPIDEMIA 08/14/2007  . ERECTILE DYSFUNCTION 05/09/2007  . Essential hypertension 11/28/2006    Current Outpatient Medications on File Prior to Visit  Medication Sig Dispense Refill  . allopurinol (ZYLOPRIM) 300 MG tablet Take 1 tablet (300 mg total) by mouth daily. 90 tablet 1  . apixaban (ELIQUIS) 5 MG TABS tablet Take 1 tablet (5 mg total) by mouth 2 (two) times daily. 60 tablet 3  .  atorvastatin (LIPITOR) 20 MG tablet Take 1 tablet by mouth once daily 90 tablet 0  . cloNIDine (CATAPRES) 0.1 MG tablet Take 1 tablet (0.1 mg total) by mouth 2 (two) times daily. 180 tablet 0  . glimepiride (AMARYL) 4 MG tablet Take 1 tablet (4 mg total) by mouth daily before supper. 90 tablet 0  . hydrALAZINE (APRESOLINE) 50 MG tablet Take 1 tablet (50 mg total) by mouth 2 (two) times daily. 60 tablet 2  . iron polysaccharides (NIFEREX) 150 MG capsule Take 1 capsule (150 mg total) by mouth daily. 90 capsule 0  . Lancets (ONETOUCH DELICA PLUS [id number]) MISC USE AS DIRECTED TWICE A DAILY 100 each 0  . LINZESS 290 MCG CAPS capsule TAKE 1 CAPSULE BY MOUTH ONCE DAILY BEFORE BREAKFAST 30 capsule 1  . metoprolol tartrate (LOPRESSOR) 100 MG tablet Take 1 tablet by mouth twice daily 180 tablet 0  . ONETOUCH ULTRA test strip USE TO TEST TWICE DAILY (Patient taking differently: 1 each by Other route 2 (two) times daily. ) 100 strip 3  . oxyCODONE-acetaminophen (PERCOCET) 5-325 MG tablet Take 1 tablet by mouth every 4 (four) hours as needed for moderate pain or severe pain. 30 tablet 0  . sildenafil (VIAGRA) 100 MG tablet Take 1 tablet (100 mg total) by mouth as needed for erectile dysfunction. 6 tablet 1  . sitaGLIPtin (JANUVIA) 50 MG tablet Take 1 tablet (50 mg total) by mouth daily. 30 tablet 2  . tamsulosin (FLOMAX) 0.4 MG CAPS capsule Take 1 capsule (0.4 mg total) by mouth daily after supper. 90 capsule  0  . traMADol (ULTRAM) 50 MG tablet Take 50-100 mg by mouth every 6 (six) hours as needed.     No current facility-administered medications on file prior to visit.    Past Medical History:  Diagnosis Date  . CHF (congestive heart failure) ([place])   . Chronic diastolic heart failure ([place])   . Constipation   . Diabetes mellitus   . Diverticulosis   . ED (erectile dysfunction)   . Gout   . Heart murmur   . Hyperlipidemia   . Hypertension   . IBS (irritable bowel syndrome)   . Renal calculus   .  Renal insufficiency   . Tubular adenoma of colon     Past Surgical History:  Procedure Laterality Date  . COLONOSCOPY  1998   negative; no F/U  . EXTRACORPOREAL SHOCK WAVE LITHOTRIPSY Left 03/20/2019   Procedure: EXTRACORPOREAL SHOCK WAVE LITHOTRIPSY (ESWL);  Surgeon: Cleon Gustin, MD;  Location: [facility];  Service: Urology;  Laterality: Left;  . knee effusion tapped      Social History   Socioeconomic History  . Marital status: Married    Spouse name: Not on file  . Number of children: 1  . Years of education: Not on file  . Highest education level: Not on file  Occupational History  . Occupation: Metallurgist: [employer]    Comment: [phone]  Tobacco Use  . Smoking status: Former Smoker    Packs/day: 0.30    Types: Cigarettes    Quit date: 07/04/1991    Years since quitting: 28.0  . Smokeless tobacco: Never Used  . Tobacco comment: smoked age [age], up to 1/3 ppd; may be less  Substance and Sexual Activity  . Alcohol use: Yes    Alcohol/week: 2.0 standard drinks    Types: 2 Glasses of wine per week    Comment: rare  . Drug use: No  . Sexual activity: Yes  Other Topics Concern  . Not on file  Social History Narrative  . Not on file   Social Determinants of Health   Financial Resource Strain:   . Difficulty of Paying Living Expenses: Not on file  Food Insecurity:   . Worried About Charity fundraiser in the Last Year: Not on file  . Ran Out of Food in the Last Year: Not on file  Transportation Needs:   . Lack of Transportation (Medical): Not on file  . Lack of Transportation (Non-Medical): Not on file  Physical Activity: Inactive  . Days of Exercise per Week: 0 days  . Minutes of Exercise per Session: 0 min  Stress:   . Feeling of Stress : Not on file  Social Connections:   . Frequency of Communication with Friends and Family: Not on file  . Frequency of Social Gatherings with Friends and Family: Not on file  . Attends  Religious Services: Not on file  . Active Member of Clubs or Organizations: Not on file  . Attends Archivist Meetings: Not on file  . Marital Status: Not on file    Family History  Problem Relation Age of Onset  . Heart attack Mother 45  . Hypertension Mother   . Prostate cancer Father 57  . Diabetes Father   . Irritable bowel syndrome Father   . Colon cancer Brother        dx in his late 1's  . Irritable bowel syndrome Sister  x 2  . Irritable bowel syndrome Brother   . Stroke Neg Hx   . Esophageal cancer Neg Hx   . Rectal cancer Neg Hx   . Stomach cancer Neg Hx     Review of Systems  Constitutional: Negative for chills and fever.  Respiratory: Negative for cough, shortness of breath and wheezing.   Cardiovascular: Positive for leg swelling (occ). Negative for chest pain and palpitations.  Gastrointestinal: Negative for abdominal pain and nausea.  Neurological: Negative for dizziness, light-headedness and headaches (rare ).       Objective:   Vitals:   07/09/19 1058  BP: 124/72  Pulse: (!) 45  Resp: 16  Temp: 98.7 F (37.1 C)  SpO2: 97%   BP Readings from Last 3 Encounters:  07/09/19 124/72  05/21/19 110/60  04/21/19 124/69   Wt Readings from Last 3 Encounters:  07/09/19 219 lb 12.8 oz (99.7 kg)  05/21/19 210 lb (95.3 kg)  04/18/19 208 lb (94.3 kg)   Body mass index is 33.42 kg/m.   Physical Exam    Constitutional: Appears well-developed and well-nourished. No distress.  HENT:  Head: Normocephalic and atraumatic.  Neck: Neck supple. No tracheal deviation present. No thyromegaly present.  No cervical lymphadenopathy Cardiovascular: Normal rate, regular rhythm and normal heart sounds.   No murmur heard. No carotid bruit .  1+ pitting b/l LE edema Pulmonary/Chest: Effort normal and breath sounds normal. No respiratory distress. No has no wheezes. No rales.  Skin: Skin is warm and dry. Not diaphoretic.  Psychiatric: Normal mood and  affect. Behavior is normal.      Assessment & Plan:    See Problem List for Assessment and Plan of chronic medical problems.    This visit occurred during the SARS-CoV-2 public health emergency.  Safety protocols were in place, including screening questions prior to the visit, additional usage of staff PPE, and extensive cleaning of exam room while observing appropriate contact time as indicated for disinfecting solutions.

## 2019-07-09 ENCOUNTER — Encounter: Payer: Self-pay | Admitting: Internal Medicine

## 2019-07-09 ENCOUNTER — Other Ambulatory Visit: Payer: Self-pay

## 2019-07-09 ENCOUNTER — Ambulatory Visit: Payer: Medicare PPO | Admitting: Internal Medicine

## 2019-07-09 VITALS — BP 124/72 | HR 45 | Temp 98.7°F | Resp 16 | Ht 68.0 in | Wt 219.8 lb

## 2019-07-09 DIAGNOSIS — I10 Essential (primary) hypertension: Secondary | ICD-10-CM

## 2019-07-09 DIAGNOSIS — E782 Mixed hyperlipidemia: Secondary | ICD-10-CM | POA: Diagnosis not present

## 2019-07-09 DIAGNOSIS — Z86711 Personal history of pulmonary embolism: Secondary | ICD-10-CM | POA: Diagnosis not present

## 2019-07-09 DIAGNOSIS — M109 Gout, unspecified: Secondary | ICD-10-CM

## 2019-07-09 DIAGNOSIS — E1129 Type 2 diabetes mellitus with other diabetic kidney complication: Secondary | ICD-10-CM | POA: Diagnosis not present

## 2019-07-09 DIAGNOSIS — N1832 Chronic kidney disease, stage 3b: Secondary | ICD-10-CM | POA: Diagnosis not present

## 2019-07-09 DIAGNOSIS — Z86718 Personal history of other venous thrombosis and embolism: Secondary | ICD-10-CM | POA: Diagnosis not present

## 2019-07-09 NOTE — Assessment & Plan Note (Signed)
Had blood work done earlier this week for nephrology and has an upcoming appointment Kidney function has improved-reviewed last blood work in November He is drinking plenty of fluids and does not take any NSAIDs

## 2019-07-09 NOTE — Assessment & Plan Note (Signed)
Following with heme-onc-has an appointment this month, which he was not aware of Heme-onc has advised lifelong anticoagulation and will manage his medication

## 2019-07-09 NOTE — Patient Instructions (Addendum)
  Medications reviewed and updated.  Changes include :   none    Please followup in 6 months   

## 2019-07-09 NOTE — Assessment & Plan Note (Signed)
Lab Results  Component Value Date   HGBA1C 6.4 05/15/2019   Chronic, stable, controlled Following with endocrine, who is managing his medications Encouraged regular exercise

## 2019-07-09 NOTE — Assessment & Plan Note (Signed)
Chronic, stable BP well controlled Current regimen effective and well tolerated Continue current medications at current doses Has had blood work and continues to have blood work done by multiple other physicians-reviewed last blood work.  Had blood work done earlier this week and will have blood work done later this month as well

## 2019-07-09 NOTE — Assessment & Plan Note (Signed)
Chronic, stable Denies gout symptoms since his last visit Continue allopurinol 300 mg daily

## 2019-07-09 NOTE — Assessment & Plan Note (Signed)
Chronic, stable Continue daily statin Regular exercise and healthy diet encouraged

## 2019-07-17 ENCOUNTER — Other Ambulatory Visit: Payer: Self-pay

## 2019-07-17 MED ORDER — SITAGLIPTIN PHOSPHATE 50 MG PO TABS: 50.0000 mg | ORAL_TABLET | Freq: Every day | ORAL | 1 refills | Status: DC

## 2019-07-18 ENCOUNTER — Inpatient Hospital Stay: Payer: Self-pay

## 2019-07-18 ENCOUNTER — Other Ambulatory Visit: Payer: Self-pay | Admitting: Internal Medicine

## 2019-07-18 ENCOUNTER — Ambulatory Visit: Payer: Self-pay | Admitting: Hematology

## 2019-07-18 ENCOUNTER — Ambulatory Visit: Payer: Medicare Other | Admitting: Hematology

## 2019-07-18 ENCOUNTER — Other Ambulatory Visit: Payer: Medicare Other

## 2019-07-18 DIAGNOSIS — N1832 Chronic kidney disease, stage 3b: Secondary | ICD-10-CM | POA: Diagnosis not present

## 2019-07-18 DIAGNOSIS — D631 Anemia in chronic kidney disease: Secondary | ICD-10-CM | POA: Diagnosis not present

## 2019-07-18 DIAGNOSIS — N2581 Secondary hyperparathyroidism of renal origin: Secondary | ICD-10-CM | POA: Diagnosis not present

## 2019-07-18 DIAGNOSIS — I129 Hypertensive chronic kidney disease with stage 1 through stage 4 chronic kidney disease, or unspecified chronic kidney disease: Secondary | ICD-10-CM | POA: Diagnosis not present

## 2019-07-22 ENCOUNTER — Inpatient Hospital Stay (HOSPITAL_BASED_OUTPATIENT_CLINIC_OR_DEPARTMENT_OTHER): Payer: Medicare PPO | Admitting: Hematology

## 2019-07-22 ENCOUNTER — Telehealth: Payer: Self-pay | Admitting: Hematology

## 2019-07-22 ENCOUNTER — Inpatient Hospital Stay: Payer: Medicare PPO | Attending: Hematology

## 2019-07-22 ENCOUNTER — Encounter: Payer: Self-pay | Admitting: Hematology

## 2019-07-22 ENCOUNTER — Other Ambulatory Visit: Payer: Self-pay

## 2019-07-22 VITALS — BP 140/66 | HR 47 | Temp 97.3°F | Resp 18 | Ht 68.0 in | Wt 220.4 lb

## 2019-07-22 DIAGNOSIS — I82441 Acute embolism and thrombosis of right tibial vein: Secondary | ICD-10-CM | POA: Diagnosis present

## 2019-07-22 DIAGNOSIS — I82411 Acute embolism and thrombosis of right femoral vein: Secondary | ICD-10-CM | POA: Diagnosis not present

## 2019-07-22 DIAGNOSIS — I824Z3 Acute embolism and thrombosis of unspecified deep veins of distal lower extremity, bilateral: Secondary | ICD-10-CM

## 2019-07-22 DIAGNOSIS — I82432 Acute embolism and thrombosis of left popliteal vein: Secondary | ICD-10-CM | POA: Insufficient documentation

## 2019-07-22 DIAGNOSIS — E1122 Type 2 diabetes mellitus with diabetic chronic kidney disease: Secondary | ICD-10-CM | POA: Diagnosis not present

## 2019-07-22 DIAGNOSIS — Z86711 Personal history of pulmonary embolism: Secondary | ICD-10-CM | POA: Diagnosis not present

## 2019-07-22 DIAGNOSIS — Z7901 Long term (current) use of anticoagulants: Secondary | ICD-10-CM | POA: Insufficient documentation

## 2019-07-22 DIAGNOSIS — Z86718 Personal history of other venous thrombosis and embolism: Secondary | ICD-10-CM

## 2019-07-22 DIAGNOSIS — D649 Anemia, unspecified: Secondary | ICD-10-CM | POA: Insufficient documentation

## 2019-07-22 DIAGNOSIS — R911 Solitary pulmonary nodule: Secondary | ICD-10-CM | POA: Insufficient documentation

## 2019-07-22 DIAGNOSIS — N184 Chronic kidney disease, stage 4 (severe): Secondary | ICD-10-CM | POA: Diagnosis not present

## 2019-07-22 LAB — CBC WITH DIFFERENTIAL (CANCER CENTER ONLY)
Abs Immature Granulocytes: 0.01 10*3/uL (ref 0.00–0.07)
Basophils Absolute: 0 10*3/uL (ref 0.0–0.1)
Basophils Relative: 1 %
Eosinophils Absolute: 0.1 10*3/uL (ref 0.0–0.5)
Eosinophils Relative: 3 %
HCT: 38.6 % — ABNORMAL LOW (ref 39.0–52.0)
Hemoglobin: 11.9 g/dL — ABNORMAL LOW (ref 13.0–17.0)
Immature Granulocytes: 0 %
Lymphocytes Relative: 26 %
Lymphs Abs: 1.3 10*3/uL (ref 0.7–4.0)
MCH: 26.4 pg (ref 26.0–34.0)
MCHC: 30.8 g/dL (ref 30.0–36.0)
MCV: 85.6 fL (ref 80.0–100.0)
Monocytes Absolute: 0.6 10*3/uL (ref 0.1–1.0)
Monocytes Relative: 11 %
Neutro Abs: 3 10*3/uL (ref 1.7–7.7)
Neutrophils Relative %: 59 %
Platelet Count: 233 10*3/uL (ref 150–400)
RBC: 4.51 MIL/uL (ref 4.22–5.81)
RDW: 14.5 % (ref 11.5–15.5)
WBC Count: 5 10*3/uL (ref 4.0–10.5)
nRBC: 0 % (ref 0.0–0.2)

## 2019-07-22 LAB — CMP (CANCER CENTER ONLY)
ALT: 52 U/L — ABNORMAL HIGH (ref 0–44)
AST: 21 U/L (ref 15–41)
Albumin: 3.9 g/dL (ref 3.5–5.0)
Alkaline Phosphatase: 72 U/L (ref 38–126)
Anion gap: 7 (ref 5–15)
BUN: 40 mg/dL — ABNORMAL HIGH (ref 8–23)
CO2: 21 mmol/L — ABNORMAL LOW (ref 22–32)
Calcium: 9.1 mg/dL (ref 8.9–10.3)
Chloride: 111 mmol/L (ref 98–111)
Creatinine: 1.83 mg/dL — ABNORMAL HIGH (ref 0.61–1.24)
GFR, Est AFR Am: 42 mL/min — ABNORMAL LOW (ref >60)
GFR, Estimated: 36 mL/min — ABNORMAL LOW (ref >60)
Glucose, Bld: 102 mg/dL — ABNORMAL HIGH (ref 70–99)
Potassium: 4.5 mmol/L (ref 3.5–5.1)
Sodium: 139 mmol/L (ref 135–145)
Total Bilirubin: 0.4 mg/dL (ref 0.3–1.2)
Total Protein: 7.1 g/dL (ref 6.5–8.1)

## 2019-07-22 LAB — D-DIMER, QUANTITATIVE: D-Dimer, Quant: 0.4 ug/mL-FEU (ref 0.00–0.50)

## 2019-07-22 NOTE — Telephone Encounter (Signed)
Appointments scheduled calendar printed per 1/19 los 

## 2019-07-22 NOTE — Progress Notes (Signed)
[place] OFFICE PROGRESS NOTE  Patient Care Team: Binnie Rail, MD as PCP - General (Internal Medicine) Sherren Mocha, MD as PCP - Cardiology (Cardiology)  HEME/ONC OVERVIEW: 1. 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 Incidental LLL nodule  -25mm nodule noted on CT in 03/2019  TREATMENT SUMMARY:  03/2019 - present: Eliquis 5mg  BID; lifelong anticoagulation   PERTINENT NON-HEM/ONC PROBLEMS: 1. 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  Goal of anticoagulation is lifelong due to unp -Clinically, patient denies any recurrent lower extremity sweeling, chest pain or dyspnea -Labs reviewed and stable -We will plan to continue Eliquis 5mg  BID for at least 6 months (until 09/2019), after which we can consider reducing the dose to 2.5mg  BID for secondary ppx if he has no evidence of recurrent VTE.  The lower dose would provide some risk reduction for recurrent VTE's, but also lower the risk of bleeding, especially in the setting of advanced CKD, which can cause platelet dysfunction and increased risk of bleeding. -I also reinforced the importance of preventive strategies such as avoiding hormonal supplement, avoiding cigarette smoking, keeping up-to-date with screening programs for early cancer detection, frequent ambulation for long distance travel and aggressive DVT prophylaxis in all surgical settings. -Should he need any interruption of the anticoagulation for elective procedures in the future, feel free to contact me regarding peri-operative management.  Incidental LLL  -Noted incidentally on CT in 03/2019 -We will plan to  repeat CT chest w/o contrast in 1 year (ie 03/2020) -If stable, then at 24 months (from the time of the initial scan) for high-risk patients (ie patients with hx of tobacco use)  Normocytic anemia -Likely secondary to Stage IV CKD  -Hgb 11.9, improving  -Clinically, patient denies any symptoms of bleeding -Repeat colonoscopy not yet scheduled due to [place] and being on anticoagulation -I have sent my note to the patient's GI team.  Anticoagulation may be held per protocol as needed for colonoscopy.   Orders Placed This Encounter  Procedures  . CBC with Differential (Cancer Center Only)    Standing Status:   Future    Standing Expiration Date:   08/25/2020  . CMP ([facility] only)    Standing Status:   Future    Standing Expiration Date:   08/25/2020  . D-dimer, quantitative (not at [facility])    Standing Status:   Future    Standing Expiration Date:   08/25/2020   All questions were answered. The patient knows to call the clinic with any problems, questions or concerns. No barriers to learning was detected.  Return in 2 months for labs and clinic appt, and determine the dose of Eliquis.  Tish Men, MD 1/19/20212:19 PM  CHIEF COMPLAINT: "I am doing fine"  INTERVAL HISTORY: Noah Barnes returns clinic for follow-up of history of bilateral DVT and suspected PTE.  Patient reports that he is tolerating Eliquis well without any abnormal bleeding or bruising. He saw his nephrologist last week, who was pleased with improvement in the patient's renal function, and did not see any indication for dialysis.  Patient reports otherwise doing well, and the denies any complaint.  REVIEW  OF SYSTEMS:   Constitutional: ( - ) fevers, ( - )  chills , ( - ) night sweats Eyes: ( - ) blurriness of vision, ( - ) double vision, ( - ) watery eyes Ears, nose, mouth, throat, and face: ( - ) mucositis, ( - ) sore throat Respiratory: ( - ) cough, ( - ) dyspnea, ( - ) wheezes Cardiovascular: ( - ) palpitation, ( - )  chest discomfort, ( - ) lower extremity swelling Gastrointestinal:  ( - ) nausea, ( - ) heartburn, ( - ) change in bowel habits Skin: ( - ) abnormal skin rashes Lymphatics: ( - ) new lymphadenopathy, ( - ) easy bruising Neurological: ( - ) numbness, ( - ) tingling, ( - ) new weaknesses Behavioral/Psych: ( - ) mood change, ( - ) new changes  All other systems were reviewed with the patient and are negative.  SUMMARY OF ONCOLOGIC HISTORY: Oncology History   No history exists.    I have reviewed the past medical history, past surgical history, social history and family history with the patient and they are unchanged from previous note.  ALLERGIES:  is allergic to ace inhibitors and angiotensin receptor blockers.  MEDICATIONS:  Current Outpatient Medications  Medication Sig Dispense Refill  . allopurinol (ZYLOPRIM) 300 MG tablet Take 1 tablet (300 mg total) by mouth daily. 90 tablet 1  . apixaban (ELIQUIS) 5 MG TABS tablet Take 1 tablet (5 mg total) by mouth 2 (two) times daily. 60 tablet 3  . atorvastatin (LIPITOR) 20 MG tablet Take 1 tablet by mouth once daily 90 tablet 0  . cloNIDine (CATAPRES) 0.1 MG tablet Take 1 tablet (0.1 mg total) by mouth 2 (two) times daily. 180 tablet 0  . glimepiride (AMARYL) 4 MG tablet Take 1 tablet (4 mg total) by mouth daily before supper. 90 tablet 0  . hydrALAZINE (APRESOLINE) 50 MG tablet Take 1 tablet (50 mg total) by mouth 2 (two) times daily. 60 tablet 2  . iron polysaccharides (NIFEREX) 150 MG capsule Take 1 capsule (150 mg total) by mouth daily. 90 capsule 0  . Lancets (ONETOUCH DELICA PLUS [id number]) MISC USE AS DIRECTED TWICE A DAILY 100 each 0  . LINZESS 290 MCG CAPS capsule TAKE 1 CAPSULE BY MOUTH ONCE DAILY BEFORE BREAKFAST 30 capsule 1  . metoprolol tartrate (LOPRESSOR) 100 MG tablet Take 1 tablet by mouth twice daily 180 tablet 0  . ONETOUCH ULTRA test strip USE TO TEST TWICE DAILY (Patient taking differently: 1 each by Other route 2 (two)  times daily. ) 100 strip 3  . oxyCODONE-acetaminophen (PERCOCET) 5-325 MG tablet Take 1 tablet by mouth every 4 (four) hours as needed for moderate pain or severe pain. 30 tablet 0  . sildenafil (VIAGRA) 100 MG tablet Take 1 tablet (100 mg total) by mouth as needed for erectile dysfunction. 6 tablet 1  . sitaGLIPtin (JANUVIA) 50 MG tablet Take 1 tablet (50 mg total) by mouth daily. 90 tablet 1  . tamsulosin (FLOMAX) 0.4 MG CAPS capsule TAKE 1 CAPSULE BY MOUTH ONCE DAILY AFTER  SUPPER 90 capsule 1  . traMADol (ULTRAM) 50 MG tablet Take 50-100 mg by mouth every 6 (six) hours as needed.     No current facility-administered medications for this visit.    PHYSICAL EXAMINATION: ECOG PERFORMANCE STATUS: 1 - Symptomatic but completely ambulatory  Today's Vitals   07/22/19 1413  BP: 140/66  Pulse: (!) 47  Resp: 18  Temp: (!) 97.3 F (36.3 C)  TempSrc: Temporal  SpO2: 100%  Weight: 220 lb 6.4 oz (100 kg)  Height: 5\' 8"  (1.727 m)  PainSc: 0-No pain   Body mass index is 33.51 kg/m.  Filed Weights   07/22/19 1413  Weight: 220 lb 6.4 oz (100 kg)    GENERAL: alert, no distress and comfortable SKIN: skin color, texture, turgor are normal, no rashes or significant lesions EYES: conjunctiva are pink and non-injected, sclera clear OROPHARYNX: no exudate, no erythema; lips, buccal mucosa, and tongue normal  NECK: supple, non-tender LUNGS: clear to auscultation with normal breathing effort HEART: regular rate & rhythm and no murmurs and no lower extremity edema ABDOMEN: soft, non-tender, non-distended, normal bowel sounds Musculoskeletal: no cyanosis of digits and no clubbing  PSYCH: alert & oriented x 3, fluent speech  LABORATORY DATA:  I have reviewed the data as listed    Component Value Date/Time   NA 139 05/15/2019 1005   K 4.7 05/15/2019 1005   CL 114 (H) 05/15/2019 1005   CO2 19 05/15/2019 1005   GLUCOSE 161 (H) 05/15/2019 1005   BUN 34 (H) 05/15/2019 1005   CREATININE 1.90  (H) 05/15/2019 1005   CREATININE 3.90 (HH) 04/17/2019 1134   CALCIUM 9.2 05/15/2019 1005   PROT 6.7 05/15/2019 1005   ALBUMIN 3.9 05/15/2019 1005   AST 24 05/15/2019 1005   AST 12 (L) 04/17/2019 1134   ALT 85 (H) 05/15/2019 1005   ALT 48 (H) 04/17/2019 1134   ALKPHOS 64 05/15/2019 1005   BILITOT 0.5 05/15/2019 1005   BILITOT 0.6 04/17/2019 1134   GFRNONAA 14 (L) 04/17/2019 1134   GFRAA 17 (L) 04/17/2019 1134    No results found for: SPEP, UPEP  Lab Results  Component Value Date   WBC 5.0 07/22/2019   NEUTROABS 3.0 07/22/2019   HGB 11.9 (L) 07/22/2019   HCT 38.6 (L) 07/22/2019   MCV 85.6 07/22/2019   PLT 233 07/22/2019      Chemistry      Component Value Date/Time   NA 139 05/15/2019 1005   K 4.7 05/15/2019 1005   CL 114 (H) 05/15/2019 1005   CO2 19 05/15/2019 1005   BUN 34 (H) 05/15/2019 1005   CREATININE 1.90 (H) 05/15/2019 1005   CREATININE 3.90 (HH) 04/17/2019 1134      Component Value Date/Time   CALCIUM 9.2 05/15/2019 1005   ALKPHOS 64 05/15/2019 1005   AST 24 05/15/2019 1005   AST 12 (L) 04/17/2019 1134   ALT 85 (H) 05/15/2019 1005   ALT 48 (H) 04/17/2019 1134   BILITOT 0.5 05/15/2019 1005   BILITOT 0.6 04/17/2019 1134       RADIOGRAPHIC STUDIES: I have personally reviewed the radiological images as listed below and agreed with the findings in the report. No results found.

## 2019-07-28 ENCOUNTER — Other Ambulatory Visit: Payer: Self-pay | Admitting: Internal Medicine

## 2019-07-31 ENCOUNTER — Telehealth: Payer: Self-pay

## 2019-07-31 MED ORDER — POLYSACCHARIDE IRON COMPLEX 150 MG PO CAPS: 150.0000 mg | ORAL_CAPSULE | Freq: Every day | ORAL | 0 refills | Status: DC

## 2019-07-31 NOTE — Telephone Encounter (Signed)
New message     1. Which medications need to be refilled? (please list name of each medication and dose if known) Ferrex 150 mg   2. Which pharmacy/location (including street and city if local pharmacy) is medication to be sent to? [facility] on [address]   3. Do they need a 30 day or 90 day supply? 30 day supply

## 2019-08-06 ENCOUNTER — Other Ambulatory Visit: Payer: Self-pay | Admitting: Internal Medicine

## 2019-08-18 ENCOUNTER — Other Ambulatory Visit: Payer: Self-pay

## 2019-08-18 ENCOUNTER — Other Ambulatory Visit (INDEPENDENT_AMBULATORY_CARE_PROVIDER_SITE_OTHER): Payer: Medicare PPO

## 2019-08-18 DIAGNOSIS — E669 Obesity, unspecified: Secondary | ICD-10-CM

## 2019-08-18 DIAGNOSIS — E1169 Type 2 diabetes mellitus with other specified complication: Secondary | ICD-10-CM

## 2019-08-18 LAB — BASIC METABOLIC PANEL
BUN: 47 mg/dL — ABNORMAL HIGH (ref 6–23)
CO2: 21 mEq/L (ref 19–32)
Calcium: 9.5 mg/dL (ref 8.4–10.5)
Chloride: 111 mEq/L (ref 96–112)
Creatinine, Ser: 1.96 mg/dL — ABNORMAL HIGH (ref 0.40–1.50)
GFR: 40.81 mL/min — ABNORMAL LOW (ref >60.00)
Glucose, Bld: 135 mg/dL — ABNORMAL HIGH (ref 70–99)
Potassium: 4.9 mEq/L (ref 3.5–5.1)
Sodium: 138 mEq/L (ref 135–145)

## 2019-08-18 LAB — HEMOGLOBIN A1C: Hgb A1c MFr Bld: 6.6 % — ABNORMAL HIGH (ref 4.6–6.5)

## 2019-08-19 ENCOUNTER — Other Ambulatory Visit: Payer: Self-pay | Admitting: Internal Medicine

## 2019-08-19 DIAGNOSIS — I824Z3 Acute embolism and thrombosis of unspecified deep veins of distal lower extremity, bilateral: Secondary | ICD-10-CM

## 2019-08-20 ENCOUNTER — Ambulatory Visit: Payer: Medicare Other

## 2019-08-21 ENCOUNTER — Ambulatory Visit (INDEPENDENT_AMBULATORY_CARE_PROVIDER_SITE_OTHER): Payer: Medicare PPO | Admitting: Endocrinology

## 2019-08-21 ENCOUNTER — Encounter: Payer: Self-pay | Admitting: Endocrinology

## 2019-08-21 DIAGNOSIS — I10 Essential (primary) hypertension: Secondary | ICD-10-CM

## 2019-08-21 DIAGNOSIS — E669 Obesity, unspecified: Secondary | ICD-10-CM

## 2019-08-21 DIAGNOSIS — N1832 Chronic kidney disease, stage 3b: Secondary | ICD-10-CM

## 2019-08-21 DIAGNOSIS — E1169 Type 2 diabetes mellitus with other specified complication: Secondary | ICD-10-CM

## 2019-08-21 NOTE — Progress Notes (Signed)
Patient ID: Noah Barnes, male   DOB: January 14, 1946, 74 y.o.   MRN: [id number]  Today's office visit was provided via telemedicine using a telephone call to the patient Patient has been explained the limitations of evaluation and management by telemedicine and the availability of in person appointments.  The patient understood the limitations and agreed to proceed. Patient also understood that the telehealth visit is billable. . Location of the patient: Home . Location of the provider: Office Only the patient and myself were participating in the encounter   Reason for Appointment : Follow-up visit  History of Present Illness          Diagnosis: Type 2 diabetes mellitus, date of diagnosis: 2008       Past history: His diabetes was borderline in the beginning and not clear what medications he was started on. Has been taking metformin for several years and this was later changed to [place]. Amaryl probably started in 5/14 when blood sugars were higher, initially was given 1 mg and now has been taking 2 mg. He had a relatively high A1c of 7.1, difficulty losing weight and a glucose of 202 on his initial consultation in 06/2013 He was then tried on Invokana in addition to his Janumet and Amaryl Invokana was reduced to 100 mg in early 2015 been creatinine had gone up to 1.6  Recent history:   Oral hypoglycemic drugs the patient is taking are: Amaryl 4 mg at supper, Januvia 50 mg    His A1c is slightly higher at 6.6 compared to 6.4, his previous range 5.8-6.5  Current management, blood sugar patterns and problems identified:     On his last visit his treatment regimen was continued unchanged  Even with 4 mg of Amaryl despite his renal dysfunction he has not had any hypoglycemia, usually tends to have higher readings in the mornings before breakfast  Although blood sugars are not downloaded today from his meter he is reporting fairly good readings when he checks them  Not  clear which of the readings are after meals from his home record  Blood sugars are reportedly fairly good and mostly around 120 or below in the morning  He is mostly eating 2 meals a day with the first meal late morning  Because of cold weather he has not done any exercise and has not done any indoor activities also  He is not clear why he has gained weight although last year weight had been fluctuating  Side effects from medications have been:  renal dysfunction from Invokana  Glucose monitoring:  done <1 times a day         Glucometer: One Touch    Blood Glucose readings from patient reporting his meter blood sugars  Recent range 114-163 Has only 2 readings over 145 likely after his main meal at lunchtime; no average available   PREVIOUS AVERAGE 122 with all readings in the mornings   Glycemic control:   Lab Results  Component Value Date   HGBA1C 6.6 (H) 08/18/2019   HGBA1C 6.4 05/15/2019   HGBA1C 6.3 03/06/2019   Lab Results  Component Value Date   MICROALBUR 3.5 (H) 05/15/2019   LDLCALC 61 05/15/2019   CREATININE 1.96 (H) 08/18/2019    Self-care: Usually has low-fat diet, eggs in am with less carbohydrate, usually no lunch  Exercise:  Only occasionally walking    Dietician consultations: 08/2013              Weight history:  Wt Readings from Last 3 Encounters:  07/22/19 220 lb 6.4 oz (100 kg)  07/09/19 219 lb 12.8 oz (99.7 kg)  05/21/19 210 lb (95.3 kg)       Allergies as of 08/21/2019      Reactions   Ace Inhibitors Other (See Comments)   Severe AKI due to ARB + dehydration Aug 2016   Angiotensin Receptor Blockers Other (See Comments)   Severe AKI due to ARB + dehydration Aug 2016      Medication List       Accurate as of August 21, 2019 10:33 AM. If you have any questions, ask your nurse or doctor.        allopurinol 300 MG tablet Commonly known as: ZYLOPRIM Take 1 tablet (300 mg total) by mouth daily.   atorvastatin 20 MG  tablet Commonly known as: LIPITOR Take 1 tablet by mouth once daily   cloNIDine 0.1 MG tablet Commonly known as: CATAPRES Take 1 tablet (0.1 mg total) by mouth 2 (two) times daily.   Eliquis 5 MG Tabs tablet Generic drug: apixaban Take 1 tablet by mouth twice daily   glimepiride 4 MG tablet Commonly known as: AMARYL Take 1 tablet (4 mg total) by mouth daily before supper.   hydrALAZINE 50 MG tablet Commonly known as: APRESOLINE Take 1 tablet by mouth twice daily   iron polysaccharides 150 MG capsule Commonly known as: NIFEREX Take 1 capsule (150 mg total) by mouth daily.   Linzess 290 MCG Caps capsule Generic drug: linaclotide TAKE 1 CAPSULE BY MOUTH ONCE DAILY BEFORE BREAKFAST   metoprolol tartrate 100 MG tablet Commonly known as: LOPRESSOR Take 1 tablet by mouth twice daily   OneTouch Delica Plus [id number] Misc USE AS DIRECTED TWICE A DAILY   OneTouch Ultra test strip Generic drug: glucose blood USE TO TEST TWICE DAILY What changed: See the new instructions.   oxyCODONE-acetaminophen 5-325 MG tablet Commonly known as: Percocet Take 1 tablet by mouth every 4 (four) hours as needed for moderate pain or severe pain.   sildenafil 100 MG tablet Commonly known as: VIAGRA TAKE 1 TABLET BY MOUTH AS NEEDED FOR ERECTILE DYSFUNCTION   sitaGLIPtin 50 MG tablet Commonly known as: Januvia Take 1 tablet (50 mg total) by mouth daily.   tamsulosin 0.4 MG Caps capsule Commonly known as: FLOMAX TAKE 1 CAPSULE BY MOUTH ONCE DAILY AFTER  SUPPER   traMADol 50 MG tablet Commonly known as: ULTRAM Take 50-100 mg by mouth every 6 (six) hours as needed.       Allergies:  Allergies  Allergen Reactions  . Ace Inhibitors Other (See Comments)    Severe AKI due to ARB + dehydration Aug 2016  . Angiotensin Receptor Blockers Other (See Comments)    Severe AKI due to ARB + dehydration Aug 2016    Past Medical History:  Diagnosis Date  . CHF (congestive heart failure) ([place])    . Chronic diastolic heart failure ([place])   . Constipation   . Diabetes mellitus   . Diverticulosis   . ED (erectile dysfunction)   . Gout   . Heart murmur   . Hyperlipidemia   . Hypertension   . IBS (irritable bowel syndrome)   . Renal calculus   . Renal insufficiency   . Tubular adenoma of colon     Past Surgical History:  Procedure Laterality Date  . COLONOSCOPY  1998   negative; no F/U  . EXTRACORPOREAL SHOCK WAVE LITHOTRIPSY Left 03/20/2019   Procedure: EXTRACORPOREAL SHOCK  WAVE LITHOTRIPSY (ESWL);  Surgeon: Cleon Gustin, MD;  Location: [facility];  Service: Urology;  Laterality: Left;  . knee effusion tapped      Family History  Problem Relation Age of Onset  . Heart attack Mother 74  . Hypertension Mother   . Prostate cancer Father 52  . Diabetes Father   . Irritable bowel syndrome Father   . Colon cancer Brother        dx in his late 76's  . Irritable bowel syndrome Sister        x 2  . Irritable bowel syndrome Brother   . Stroke Neg Hx   . Esophageal cancer Neg Hx   . Rectal cancer Neg Hx   . Stomach cancer Neg Hx     Social History:  reports that he quit smoking about 28 years ago. His smoking use included cigarettes. He smoked 0.30 packs per day. He has never used smokeless tobacco. He reports current alcohol use of about 2.0 standard drinks of alcohol per week. He reports that he does not use drugs.    Review of Systems   RENAL dysfunction: Creatinine is relatively higher with recent hospitalization     Lab Results  Component Value Date   CREATININE 1.96 (H) 08/18/2019   CREATININE 1.83 (H) 07/22/2019   CREATININE 1.90 (H) 05/15/2019   CREATININE 3.55 (H) 04/18/2019     Lab Results  Component Value Date   K 4.9 08/18/2019          Lipids: Has been on Lipitor 20 mg for hyperlipidemia, baseline LDL about 162 Followed by Dr. Quay Burow  Lab Results  Component Value Date   CHOL 117 05/15/2019   HDL 40.70 05/15/2019   LDLCALC 61 05/15/2019    LDLDIRECT 80.1 04/15/2014   TRIG 80.0 05/15/2019   CHOLHDL 3 05/15/2019       The blood pressure has been high for several years and usually well controlled.  Currently taking, hydralazine, clonidine and metoprolol. Blood pressure at home today 134/69  BP Readings from Last 3 Encounters:  07/22/19 140/66  07/09/19 124/72  05/21/19 110/60      LABS:  Lab on 08/18/2019  Component Date Value Ref Range Status  . Sodium 08/18/2019 138  135 - 145 mEq/L Final  . Potassium 08/18/2019 4.9  3.5 - 5.1 mEq/L Final  . Chloride 08/18/2019 111  96 - 112 mEq/L Final  . CO2 08/18/2019 21  19 - 32 mEq/L Final  . Glucose, Bld 08/18/2019 135* 70 - 99 mg/dL Final  . BUN 08/18/2019 47* 6 - 23 mg/dL Final  . Creatinine, Ser 08/18/2019 1.96* 0.40 - 1.50 mg/dL Final  . GFR 08/18/2019 40.81* >60.00 mL/min Final  . Calcium 08/18/2019 9.5  8.4 - 10.5 mg/dL Final  . Hgb A1c MFr Bld 08/18/2019 6.6* 4.6 - 6.5 % Final   Glycemic Control Guidelines for People with Diabetes:Non Diabetic:  <6%Goal of Therapy: <7%Additional Action Suggested:  >8%     Physical Examination:  There were no vitals taken for this visit.       ASSESSMENT/PLAN:   Diabetes type 2, with obesity  See history of present illness for detailed discussion of current diabetes management, blood sugar patterns and problems identified  He is now on Januvia 50 mg and Amaryl 4 mg  A1c is slightly higher at 6.6  Blood sugars are appear to be controlled with 4 mg Amaryl and 50 mg Januvia He appears not to have high readings fasting which  were present previously and also has not reported any high postprandial readings He does need to however check readings consistently and will need to review his meter from download on the next visit in person As before his main difficulty is not being able to lose weight He has not exercised but discussed that he can start doing indoor exercises using any media portal  If he has higher blood sugars  after meals may need to consider a GLP-1 drug such as Rybelsus instead of Januvia which also may help him with weight loss  RENAL dysfunction: Creatinine now fairly stable, this is not related to diabetes since his microalbumin has been normal   HYPERTENSION: Well controlled with his blood pressure has been fairly good at home reportedly  Follow-up in 4 months  There are no Patient Instructions on file for this visit.  Duration of telephone encounter =11 minutes  Elayne Snare 08/21/2019, 10:33 AM

## 2019-09-23 ENCOUNTER — Other Ambulatory Visit: Payer: Self-pay

## 2019-09-23 ENCOUNTER — Encounter: Payer: Self-pay | Admitting: Hematology

## 2019-09-23 ENCOUNTER — Inpatient Hospital Stay: Payer: Medicare PPO | Attending: Hematology

## 2019-09-23 ENCOUNTER — Inpatient Hospital Stay (HOSPITAL_BASED_OUTPATIENT_CLINIC_OR_DEPARTMENT_OTHER): Payer: Medicare PPO | Admitting: Hematology

## 2019-09-23 VITALS — BP 135/68 | HR 50 | Temp 96.9°F | Resp 18 | Ht 68.0 in | Wt 220.8 lb

## 2019-09-23 DIAGNOSIS — Z7984 Long term (current) use of oral hypoglycemic drugs: Secondary | ICD-10-CM | POA: Diagnosis not present

## 2019-09-23 DIAGNOSIS — Z86711 Personal history of pulmonary embolism: Secondary | ICD-10-CM

## 2019-09-23 DIAGNOSIS — Z79899 Other long term (current) drug therapy: Secondary | ICD-10-CM | POA: Insufficient documentation

## 2019-09-23 DIAGNOSIS — E1122 Type 2 diabetes mellitus with diabetic chronic kidney disease: Secondary | ICD-10-CM | POA: Diagnosis not present

## 2019-09-23 DIAGNOSIS — N184 Chronic kidney disease, stage 4 (severe): Secondary | ICD-10-CM | POA: Diagnosis not present

## 2019-09-23 DIAGNOSIS — R918 Other nonspecific abnormal finding of lung field: Secondary | ICD-10-CM | POA: Diagnosis not present

## 2019-09-23 DIAGNOSIS — D649 Anemia, unspecified: Secondary | ICD-10-CM

## 2019-09-23 DIAGNOSIS — Z86718 Personal history of other venous thrombosis and embolism: Secondary | ICD-10-CM

## 2019-09-23 DIAGNOSIS — Z7901 Long term (current) use of anticoagulants: Secondary | ICD-10-CM | POA: Diagnosis not present

## 2019-09-23 LAB — CBC WITH DIFFERENTIAL (CANCER CENTER ONLY)
Abs Immature Granulocytes: 0.02 10*3/uL (ref 0.00–0.07)
Basophils Absolute: 0 10*3/uL (ref 0.0–0.1)
Basophils Relative: 1 %
Eosinophils Absolute: 0.1 10*3/uL (ref 0.0–0.5)
Eosinophils Relative: 3 %
HCT: 40.7 % (ref 39.0–52.0)
Hemoglobin: 12.5 g/dL — ABNORMAL LOW (ref 13.0–17.0)
Immature Granulocytes: 0 %
Lymphocytes Relative: 25 %
Lymphs Abs: 1.2 10*3/uL (ref 0.7–4.0)
MCH: 26 pg (ref 26.0–34.0)
MCHC: 30.7 g/dL (ref 30.0–36.0)
MCV: 84.8 fL (ref 80.0–100.0)
Monocytes Absolute: 0.4 10*3/uL (ref 0.1–1.0)
Monocytes Relative: 9 %
Neutro Abs: 3 10*3/uL (ref 1.7–7.7)
Neutrophils Relative %: 62 %
Platelet Count: 234 10*3/uL (ref 150–400)
RBC: 4.8 MIL/uL (ref 4.22–5.81)
RDW: 14 % (ref 11.5–15.5)
WBC Count: 4.8 10*3/uL (ref 4.0–10.5)
nRBC: 0 % (ref 0.0–0.2)

## 2019-09-23 LAB — CMP (CANCER CENTER ONLY)
ALT: 40 U/L (ref 0–44)
AST: 19 U/L (ref 15–41)
Albumin: 4.1 g/dL (ref 3.5–5.0)
Alkaline Phosphatase: 69 U/L (ref 38–126)
Anion gap: 7 (ref 5–15)
BUN: 45 mg/dL — ABNORMAL HIGH (ref 8–23)
CO2: 22 mmol/L (ref 22–32)
Calcium: 9.5 mg/dL (ref 8.9–10.3)
Chloride: 108 mmol/L (ref 98–111)
Creatinine: 1.99 mg/dL — ABNORMAL HIGH (ref 0.61–1.24)
GFR, Est AFR Am: 38 mL/min — ABNORMAL LOW (ref >60)
GFR, Estimated: 33 mL/min — ABNORMAL LOW (ref >60)
Glucose, Bld: 204 mg/dL — ABNORMAL HIGH (ref 70–99)
Potassium: 4.8 mmol/L (ref 3.5–5.1)
Sodium: 137 mmol/L (ref 135–145)
Total Bilirubin: 0.6 mg/dL (ref 0.3–1.2)
Total Protein: 6.9 g/dL (ref 6.5–8.1)

## 2019-09-23 LAB — D-DIMER, QUANTITATIVE: D-Dimer, Quant: 0.34 ug/mL-FEU (ref 0.00–0.50)

## 2019-09-23 MED ORDER — APIXABAN 2.5 MG PO TABS: 2.5000 mg | ORAL_TABLET | Freq: Two times a day (BID) | ORAL | 3 refills | Status: DC

## 2019-09-23 NOTE — Progress Notes (Signed)
[facility] OFF[facility] PROGRESS NOTE  Patient Care Team: Binnie Rail, MD as PCP - General (Internal Medicine) Sherren Mocha, MD as PCP - Cardiology (Cardiology)  HEME/ONC OVERVIEW: 1. 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 Incidental LLL nodule  -57mm nodule noted on CT in 03/2019  TREATMENT SUMMARY:  03/2019 - present: Eliquis, currently on 2.5mg  BID for secondary ppx; lifelong anticoagulation   PERTINENT NON-HEM/ONC PROBLEMS: 1. 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  -Clinically, patient denies any recurrent lower extremity sweeling, chest pain or dyspnea -Labs reviewed and stable -As he has already completed the 40-month of therapeutic anticoagulation, and has no clinical evidence of recurrent DVT or PTE, I have reduced his Eliquis to 2.5 mg BID for secondary prophylaxis -I also reinforced the importance of preventive strategies such as avoiding hormonal supplement, avoiding cigarette smoking, keeping up-to-date with screening programs for early cancer detection, frequent ambulation for long distance travel and aggressive DVT prophylaxis in all surgical settings. -Should he need any interruption of the anticoagulation for elective procedures in the future, feel free to contact me regarding peri-operative management.  Incidental LLL  -Noted incidentally on CT in 03/2019 -We will plan to repeat CT chest w/o contrast in 1 year (ie 03/2020) -If stable, then at 24 months (from the time of the initial scan) for high-risk patients (ie patients with  hx of tobacco use)  Normocytic anemia -Likely secondary to Stage IV CKD  -Hgb 12.5, improving  -Clinically, patient denies any symptoms of bleeding -Repeat colonoscopy not yet scheduled due to Lupus and being on anticoagulation -I encouraged patient to contact his gastroenterologist to schedule his colonoscopy at his earliest convenience  Orders Placed This Encounter  Procedures  . CBC with Differential (Cancer Center Only)    Standing Status:   Future    Standing Expiration Date:   10/27/2020  . CMP ([facility] [facility] only)    Standing Status:   Future    Standing Expiration Date:   10/27/2020   All questions were answered. The patient knows to call the clinic with any problems, questions or concerns. No barriers to learning was detected.  Return in 6 months for labs and clinic follow-up.  Noah Men, MD 3/23/202110:52 AM  CHIEF COMPLAINT: "I am doing fine"  INTERVAL HISTORY: [name] returns clinic for follow-up of history of bilateral lower extremity DVT and suspected PTE on Eliquis.  Patient reports that he has been tolerating Eliquis well without any abnormal bleeding or bruising.  He denies any recurrent chest pain, dyspnea, palpitation, or hemoptysis.  He is scheduled to see his nephrologist next month.  He denies any other complaint today.  REVIEW OF SYSTEMS:   Constitutional: ( - ) fevers, ( - )  chills , ( - ) night sweats Eyes: ( - ) blurriness of vision, ( - ) double vision, ( - ) watery eyes Ears, nose, mouth, throat, and face: ( - ) mucositis, ( - ) sore throat Respiratory: ( - ) cough, ( - ) dyspnea, ( - ) wheezes Cardiovascular: ( - ) palpitation, ( - )  chest discomfort, ( - ) lower extremity swelling Gastrointestinal:  ( - ) nausea, ( - ) heartburn, ( - ) change in bowel habits Skin: ( - ) abnormal skin rashes Lymphatics: ( - ) new lymphadenopathy, ( - ) easy bruising Neurological: ( - ) numbness, ( - ) tingling, ( - ) new weaknesses Behavioral/Psych: ( - ) mood  change, ( - ) new changes  All other systems were reviewed with the patient and are negative.  SUMMARY OF ONCOLOGIC HISTORY: Oncology History   No history exists.    I have reviewed the past medical history, past surgical history, social history and family history with the patient and they are unchanged from previous note.  ALLERGIES:  is allergic to ace inhibitors and angiotensin receptor blockers.  MEDICATIONS:  Current Outpatient Medications  Medication Sig Dispense Refill  . allopurinol (ZYLOPRIM) 300 MG tablet Take 1 tablet (300 mg total) by mouth daily. 90 tablet 1  . apixaban (ELIQUIS) 2.5 MG TABS tablet Take 1 tablet (2.5 mg total) by mouth 2 (two) times daily. 180 tablet 3  . atorvastatin (LIPITOR) 20 MG tablet Take 1 tablet by mouth once daily 90 tablet 0  . cloNIDine (CATAPRES) 0.1 MG tablet Take 1 tablet (0.1 mg total) by mouth 2 (two) times daily. 180 tablet 0  . glimepiride (AMARYL) 4 MG tablet Take 1 tablet (4 mg total) by mouth daily before supper. 90 tablet 0  . hydrALAZINE (APRESOLINE) 50 MG tablet Take 1 tablet by mouth twice daily 180 tablet 1  . iron polysaccharides (NIFEREX) 150 MG capsule Take 1 capsule (150 mg total) by mouth daily. 90 capsule 0  . Lancets (ONETOUCH DELICA PLUS [id number]) MISC USE AS DIRECTED TWICE A DAILY 100 each 0  . LINZESS 290 MCG CAPS capsule TAKE 1 CAPSULE BY MOUTH ONCE DAILY BEFORE BREAKFAST 30 capsule 1  . metoprolol tartrate (LOPRESSOR) 100 MG tablet Take 1 tablet by mouth twice daily 180 tablet 0  . ONETOUCH ULTRA test strip USE TO TEST TWICE DAILY (Patient taking differently: 1 each by Other route 2 (two) times daily. ) 100 strip 3  . sildenafil (VIAGRA) 100 MG tablet TAKE 1 TABLET BY MOUTH AS NEEDED FOR ERECTILE DYSFUNCTION 6 tablet 1  . sitaGLIPtin (JANUVIA) 50 MG tablet Take 1 tablet (50 mg total) by mouth daily. 90 tablet 1  . tamsulosin (FLOMAX) 0.4 MG CAPS capsule TAKE 1 CAPSULE BY MOUTH ONCE DAILY AFTER  SUPPER 90 capsule 1    No current facility-administered medications for this visit.    PHYSICAL EXAMINATION: ECOG PERFORMANCE STATUS: 1 - Symptomatic but completely ambulatory  Today's Vitals   09/23/19 1040  BP: 135/68  Pulse: (!) 50  Resp: 18  Temp: (!) 96.9 F (36.1 C)  TempSrc: Temporal  SpO2: 100%  Weight: 220 lb 12.8 oz (100.2 kg)  Height: 5\' 8"  (1.727 m)  PainSc: 0-No pain   Body mass index is 33.57 kg/m.  Filed Weights   09/23/19 1040  Weight: 220 lb 12.8 oz (100.2 kg)    GENERAL: alert, no distress and comfortable SKIN: skin color, texture, turgor are normal, no rashes or significant lesions EYES: conjunctiva are pink and non-injected, sclera clear OROPHARYNX: no exudate, no erythema; lips, buccal mucosa, and tongue normal  NECK: supple, non-tender LUNGS: clear to auscultation with normal breathing effort HEART: regular rate & rhythm and no murmurs and no lower extremity edema ABDOMEN: soft, non-tender, non-distended, normal bowel sounds Musculoskeletal: no cyanosis of digits and no clubbing  PSYCH: alert & oriented x 3, fluent speech  LABORATORY DATA:  I have reviewed the data as listed    Component Value Date/Time   NA 137 09/23/2019 1000   K 4.8 09/23/2019 1000   CL 108 09/23/2019 1000   CO2 22 09/23/2019 1000   GLUCOSE 204 (H) 09/23/2019 1000   BUN 45 (H) 09/23/2019 1000   CREATININE 1.99 (H) 09/23/2019 1000   CALCIUM 9.5 09/23/2019 1000   PROT 6.9 09/23/2019 1000   ALBUMIN 4.1 09/23/2019 1000   AST 19 09/23/2019 1000   ALT 40 09/23/2019 1000   ALKPHOS 69 09/23/2019 1000   BILITOT 0.6 09/23/2019 1000   GFRNONAA 33 (L) 09/23/2019 1000   GFRAA 38 (L) 09/23/2019 1000    No results found for: SPEP, UPEP  Lab Results  Component Value Date   WBC 4.8 09/23/2019   NEUTROABS 3.0 09/23/2019   HGB 12.5 (L) 09/23/2019   HCT 40.7 09/23/2019   MCV 84.8 09/23/2019   PLT 234 09/23/2019      Chemistry      Component Value Date/Time   NA 137 09/23/2019 1000   K 4.8  09/23/2019 1000   CL 108 09/23/2019 1000   CO2 22 09/23/2019 1000   BUN 45 (H) 09/23/2019 1000   CREATININE 1.99 (H) 09/23/2019 1000      Component Value Date/Time   CALCIUM 9.5 09/23/2019 1000   ALKPHOS 69 09/23/2019 1000   AST 19 09/23/2019 1000   ALT 40 09/23/2019 1000   BILITOT 0.6 09/23/2019 1000       RADIOGRAPHIC STUDIES: I have personally reviewed the radiological images as listed below and agreed with the findings in the report. No results found.

## 2019-09-24 ENCOUNTER — Other Ambulatory Visit: Payer: Self-pay

## 2019-09-24 MED ORDER — ONETOUCH ULTRA VI STRP: ORAL_STRIP | 3 refills | Status: DC

## 2019-09-25 ENCOUNTER — Other Ambulatory Visit: Payer: Self-pay

## 2019-09-25 ENCOUNTER — Telehealth: Payer: Self-pay | Admitting: Internal Medicine

## 2019-09-25 MED ORDER — ACCU-CHEK GUIDE ME W/DEVICE KIT: 1.0000 | PACK | Freq: Two times a day (BID) | 0 refills | Status: DC

## 2019-09-25 MED ORDER — ACCU-CHEK GUIDE VI STRP: ORAL_STRIP | 2 refills | Status: DC

## 2019-09-25 MED ORDER — SILDENAFIL CITRATE 100 MG PO TABS: ORAL_TABLET | ORAL | 1 refills | Status: DC

## 2019-09-25 MED ORDER — ACCU-CHEK FASTCLIX LANCETS MISC: 2 refills | Status: DC

## 2019-09-25 NOTE — Telephone Encounter (Signed)
    1.Medication Requested: sildenafil (VIAGRA) 100 MG tablet  2. Pharmacy (Name, Street, [place], [place] - [address] AT [place]  3. On Med List: yes  4. Last Visit with PCP: 07/09/19  5. Next visit date with PCP:   Agent: Please be advised that RX refills may take up to 3 business days. We ask that you follow-up with your pharmacy.

## 2019-09-25 NOTE — Telephone Encounter (Signed)
Rx sent 

## 2019-09-29 ENCOUNTER — Ambulatory Visit: Payer: Medicare PPO

## 2019-09-29 ENCOUNTER — Other Ambulatory Visit: Payer: Self-pay

## 2019-09-29 VITALS — BP 122/70 | HR 59 | Temp 98.2°F | Resp 16 | Ht 68.0 in | Wt 217.2 lb

## 2019-09-29 DIAGNOSIS — Z0000 Encounter for general adult medical examination without abnormal findings: Secondary | ICD-10-CM

## 2019-09-29 NOTE — Progress Notes (Addendum)
Subjective:   Noah Barnes is a 74 y.o. male who presents for Medicare Annual/Subsequent preventive examination.  Review of Systems:  Medicare Wellness Visit Additional risk factors are reflected int he social history. Cardiac Risk Factors include: advanced age (>55 men, >6 women); diabetes mellitus; dyslipidemia; male gender; hypertension Sleep patterns: no issues with falling asleep; may get up 1 time to urinate, but sleeps 6-7 hours nightly.      Objective:    Vitals: BP 122/70 (BP Location: Left Arm, Patient Position: Sitting, Cuff Size: Normal)   Pulse (!) 59   Temp 98.2 F (36.8 C)   Resp 16   Ht '5\' 8"'  (1.727 m)   Wt 217 lb 3.2 oz (98.5 kg)   SpO2 97%   BMI 33.03 kg/m   Body mass index is 33.03 kg/m.  Advanced Directives 09/29/2019 09/23/2019 07/22/2019 04/17/2019 03/24/2019 03/20/2019 08/13/2018  Does Patient Have a Medical Advance Directive? No No No No No No No  Does patient want to make changes to medical advance directive? - - - - - - Yes (ED - Information included in AVS)  Would patient like information on creating a medical advance directive? No - Patient declined No - Patient declined No - Patient declined No - Patient declined No - Patient declined No - Patient declined -    Tobacco Social History   Tobacco Use  Smoking Status Former Smoker  . Packs/day: 0.30  . Types: Cigarettes  . Quit date: 07/04/1991  . Years since quitting: 28.2  Smokeless Tobacco Never Used  Tobacco Comment   smoked age [age], up to 1/3 ppd; may be less     Counseling given: No Comment: smoked age [age], up to 1/3 ppd; may be less   Clinical Intake:  Pre-visit preparation completed: Yes  Pain : No/denies pain Pain Score: 0-No pain    Nutritional Risks: None Diabetes: Yes CBG done?: No Did pt. bring in CBG monitor from home?: No  How often do you need to have someone help you when you read instructions, pamphlets, or other written materials from your doctor or  pharmacy?: 1 - Never What is the last grade level you completed in school?: College Graduate  Interpreter Needed?: No  Information entered by :: Chriss Mannan N. Lowell Guitar, LPN  Past Medical History:  Diagnosis Date  . CHF (congestive heart failure) ([place])   . Chronic diastolic heart failure ([place])   . Constipation   . Diabetes mellitus   . Diverticulosis   . ED (erectile dysfunction)   . Gout   . Heart murmur   . Hyperlipidemia   . Hypertension   . IBS (irritable bowel syndrome)   . Renal calculus   . Renal insufficiency   . Tubular adenoma of colon    Past Surgical History:  Procedure Laterality Date  . COLONOSCOPY  1998   negative; no F/U  . EXTRACORPOREAL SHOCK WAVE LITHOTRIPSY Left 03/20/2019   Procedure: EXTRACORPOREAL SHOCK WAVE LITHOTRIPSY (ESWL);  Surgeon: Cleon Gustin, MD;  Location: [facility];  Service: Urology;  Laterality: Left;  . knee effusion tapped     Family History  Problem Relation Age of Onset  . Heart attack Mother 30  . Hypertension Mother   . Prostate cancer Father 95  . Diabetes Father   . Irritable bowel syndrome Father   . Colon cancer Brother        dx in his late 6's  . Irritable bowel syndrome Sister  x 2  . Irritable bowel syndrome Brother   . Stroke Neg Hx   . Esophageal cancer Neg Hx   . Rectal cancer Neg Hx   . Stomach cancer Neg Hx    Social History   Socioeconomic History  . Marital status: Married    Spouse name: Not on file  . Number of children: 1  . Years of education: Not on file  . Highest education level: Not on file  Occupational History  . Occupation: Metallurgist: [employer]    Comment: [phone]  Tobacco Use  . Smoking status: Former Smoker    Packs/day: 0.30    Types: Cigarettes    Quit date: 07/04/1991    Years since quitting: 28.2  . Smokeless tobacco: Never Used  . Tobacco comment: smoked age [age], up to 1/3 ppd; may be less  Substance and Sexual Activity  .  Alcohol use: Yes    Alcohol/week: 2.0 standard drinks    Types: 2 Glasses of wine per week    Comment: rare  . Drug use: No  . Sexual activity: Yes  Other Topics Concern  . Not on file  Social History Narrative  . Not on file   Social Determinants of Health   Financial Resource Strain:   . Difficulty of Paying Living Expenses:   Food Insecurity:   . Worried About Charity fundraiser in the Last Year:   . Arboriculturist in the Last Year:   Transportation Needs:   . Film/video editor (Medical):   [name] Lack of Transportation (Non-Medical):   Physical Activity:   . Days of Exercise per Week:   . Minutes of Exercise per Session:   Stress:   . Feeling of Stress :   Social Connections:   . Frequency of Communication with Friends and Family:   . Frequency of Social Gatherings with Friends and Family:   . Attends Religious Services:   . Active Member of Clubs or Organizations:   . Attends Archivist Meetings:   [name] Marital Status:     Outpatient Encounter Medications as of 09/29/2019  Medication Sig  . Accu-Chek FastClix Lancets MISC Use Accu Chek fastclix lancets to check blood sugar twice daily.  [name] allopurinol (ZYLOPRIM) 300 MG tablet Take 1 tablet (300 mg total) by mouth daily.  [name] apixaban (ELIQUIS) 2.5 MG TABS tablet Take 1 tablet (2.5 mg total) by mouth 2 (two) times daily.  [name] atorvastatin (LIPITOR) 20 MG tablet Take 1 tablet by mouth once daily  . Blood Glucose Monitoring Suppl (ACCU-CHEK GUIDE ME) w/Device KIT 1 each by Does not apply route in the morning and at bedtime. Use Accu Chek Guide me device to check blood sugar twice daily.  . cloNIDine (CATAPRES) 0.1 MG tablet Take 1 tablet (0.1 mg total) by mouth 2 (two) times daily.  [name] glimepiride (AMARYL) 4 MG tablet Take 1 tablet (4 mg total) by mouth daily before supper.  [name] glucose blood (ACCU-CHEK GUIDE) test strip Use Accu Chek Guide test strips as instructed to check blood sugar twice daily.  . hydrALAZINE  (APRESOLINE) 50 MG tablet Take 1 tablet by mouth twice daily  . iron polysaccharides (NIFEREX) 150 MG capsule Take 1 capsule (150 mg total) by mouth daily.  [name] LINZESS 290 MCG CAPS capsule TAKE 1 CAPSULE BY MOUTH ONCE DAILY BEFORE BREAKFAST  . metoprolol tartrate (LOPRESSOR) 100 MG tablet Take 1 tablet by mouth twice  daily  . sildenafil (VIAGRA) 100 MG tablet TAKE 1 TABLET BY MOUTH AS NEEDED FOR ERECTILE DYSFUNCTION  . sitaGLIPtin (JANUVIA) 50 MG tablet Take 1 tablet (50 mg total) by mouth daily.  . tamsulosin (FLOMAX) 0.4 MG CAPS capsule TAKE 1 CAPSULE BY MOUTH ONCE DAILY AFTER  SUPPER   No facility-administered encounter medications on file as of 09/29/2019.    Activities of Daily Living In your present state of health, do you have any difficulty performing the following activities: 03/25/2019  Hearing? N  Vision? N  Difficulty concentrating or making decisions? N  Walking or climbing stairs? N  Dressing or bathing? N  Doing errands, shopping? N  Some recent data might be hidden    Patient Care Team: Binnie Rail, MD as PCP - General (Internal Medicine) Sherren Mocha, MD as PCP - Cardiology (Cardiology) Sherren Mocha, MD as Consulting Physician (Cardiology) Elayne Snare, MD as Consulting Physician (Endocrinology)   Assessment:   This is a routine wellness examination for [name].  Exercise Activities and Dietary recommendations    Goals    . Client understands the importance of follow-up with providers by attending scheduled visits (pt-stated)     "To stay alive"    . Exercise 3x per week (30 min per time)     Will increase exercise to walking [facility]; Will continue toward weight loss goals    . Patient Stated     Try as much as possible to stay ahead of the constipation. Continue to take the medications to help and follow the instructions of Dr. Hilarie Fredrickson.  Enjoy life, family and grand-daughter.    . Patient Stated     Continue to be  physically and socially active.       Fall Risk Fall Risk  09/29/2019 04/04/2019 01/06/2019 08/13/2018 08/08/2017  Falls in the past year? 0 0 0 0 No  Number falls in past yr: 0 0 0 - -  Injury with Fall? 0 - - - -  Risk for fall due to : No Fall Risks - - - -  Follow up Falls evaluation completed;Falls prevention discussed;Education provided - - - -   Is the patient's home free of loose throw rugs in walkways, pet beds, electrical cords, etc?   yes      Grab bars in the bathroom? yes      Handrails on the stairs?   yes      Adequate lighting?   yes  Timed Get Up and Go Performed:   Depression Screen PHQ 2/9 Scores 09/29/2019 01/06/2019 08/13/2018 08/08/2017  PHQ - 2 Score 0 0 0 0  PHQ- 9 Score - - - 0    Cognitive Function MMSE - Mini Mental State Exam 08/08/2017 12/15/2014  Not completed: - Unable to complete  Orientation to time 5 -  Orientation to Place 5 -  Registration 3 -  Attention/ Calculation 5 -  Recall 3 -  Language- name 2 objects 2 -  Language- repeat 1 -  Language- follow 3 step command 3 -  Language- read & follow direction 1 -  Write a sentence 1 -  Copy design 1 -  Total score 30 -     6CIT Screen 09/29/2019  What Year? 0 points  What month? 0 points  What time? 0 points  Count back from 20 0 points  Months in reverse 0 points  Repeat phrase 0 points  Total Score 0    Immunization  History  Administered Date(s) Administered  . Fluad Quad(high Dose 65+) 03/06/2019  . Influenza, High Dose Seasonal PF 07/12/2016, 04/10/2017, 03/22/2018  . Pneumococcal Conjugate-13 01/06/2019    Qualifies for Shingles Vaccine? Yes, will discuss at next appointment  Screening Tests Health Maintenance  Topic Date Due  . TETANUS/TDAP  Never done  . FOOT EXAM  12/21/2018  . COLONOSCOPY  03/04/2019  . PNA vac Low Risk Adult (2 of 2 - PPSV23) 01/06/2020  . OPHTHALMOLOGY EXAM  01/08/2020  . HEMOGLOBIN A1C  02/15/2020  . INFLUENZA VACCINE  Completed  . Hepatitis C Screening   Completed   Cancer Screenings: Lung: Low Dose CT Chest recommended if Age 70-80 years, 30 pack-year currently smoking OR have quit w/in 15years. Patient does not qualify. Colorectal: up to date (due: 03/01/24)      Plan:    I have personally reviewed and noted the following in the patient's chart:   . Medical and social history . Use of alcohol, tobacco or illicit drugs  . Current medications and supplements . Functional ability and status . Nutritional status . Physical activity . Advanced directives . List of other physicians . Hospitalizations, surgeries, and ER visits in previous 12 months . Vitals . Screenings to include cognitive, depression, and falls . Referrals and appointments  In addition, I have reviewed and discussed with patient certain preventive protocols, quality metrics, and best practice recommendations. A written personalized care plan for preventive services as well as general preventive health recommendations were provided to patient.     Sheral Flow, LPN  9/73/5329 Nurse Health Advisor    Medical screening examination/treatment/procedure(s) were performed by non-physician practitioner and as supervising physician I was immediately available for consultation/collaboration. I agree with above. Binnie Rail, MD

## 2019-09-29 NOTE — Patient Instructions (Addendum)
Noah Barnes , Thank you for taking time to come for your Medicare Wellness Visit. I appreciate your ongoing commitment to your health goals. Please review the following plan we discussed and let me know if I can assist you in the future.   Screening recommendations/referrals: Colorectal Screening: due 03/01/2024  Vision and Dental Exams: Recommended annual ophthalmology exams for early detection of glaucoma and other disorders of the eye Recommended annual dental exams for proper oral hygiene  Diabetic Exams: Diabetic Eye Exam: up to date 01/08/2019 Diabetic Foot Exam: up to date 12/20/2017  Vaccinations: Influenza vaccine: 03/06/2019 Pneumococcal vaccine: 01/06/2019; need second dose (Pneumovax 23) Tdap vaccine: will discuss at next physical appointment Shingles vaccine: Please call your insurance company to determine your out of pocket expense for the Shingrix vaccine. You may receive this vaccine at your local pharmacy. Covid vaccine: [place] 08/11/2019, 09/01/2019  Advanced directives: Advance directives discussed with you today.We have received a copy of your POA (Power of [place]) and/or Living Will. These documents can be located in your chart.  Please bring a copy of your POA (Power of [place]) and/or Living Will to your next appointment.  Goals:  Recommend to drink at least 6-8 8oz glasses of water per day.  Recommend to exercise for at least 150 minutes per week.  Recommend to remove any items from the home that may cause slips or trips.  Recommend to decrease portion sizes by eating 3 small healthy meals and at least 2 healthy snacks per day.  Recommend to begin DASH diet as directed below  Recommend to continue efforts to reduce smoking habits until no longer smoking. Smoking Cessation literature is attached below.  Next appointment: Please schedule your Annual Wellness Visit with your Nurse Health Advisor in one year.  Preventive Care 67 Years and Older, Male Preventive care  refers to lifestyle choices and visits with your health care provider that can promote health and wellness. What does preventive care include?  A yearly physical exam. This is also called an annual well check.  Dental exams once or twice a year.  Routine eye exams. Ask your health care provider how often you should have your eyes checked.  Personal lifestyle choices, including:  Daily care of your teeth and gums.  Regular physical activity.  Eating a healthy diet.  Avoiding tobacco and drug use.  Limiting alcohol use.  Practicing safe sex.  Taking low doses of aspirin every day if recommended by your health care provider..  Taking vitamin and mineral supplements as recommended by your health care provider. What happens during an annual well check? The services and screenings done by your health care provider during your annual well check will depend on your age, overall health, lifestyle risk factors, and family history of disease. Counseling  Your health care provider may ask you questions about your:  Alcohol use.  Tobacco use.  Drug use.  Emotional well-being.  Home and relationship well-being.  Sexual activity.  Eating habits.  History of falls.  Memory and ability to understand (cognition).  Work and work Statistician. Screening  You may have the following tests or measurements:  Height, weight, and BMI.  Blood pressure.  Lipid and cholesterol levels. These may be checked every 5 years, or more frequently if you are over 94 years old.  Skin check.  Lung cancer screening. You may have this screening every year starting at age 41 if you have a 30-pack-year history of smoking and currently smoke or have quit within the  past 15 years.  Fecal occult blood test (FOBT) of the stool. You may have this test every year starting at age 37.  Flexible sigmoidoscopy or colonoscopy. You may have a sigmoidoscopy every 5 years or a colonoscopy every 10 years  starting at age 33.  Prostate cancer screening. Recommendations will vary depending on your family history and other risks.  Hepatitis C blood test.  Hepatitis B blood test.  Sexually transmitted disease (STD) testing.  Diabetes screening. This is done by checking your blood sugar (glucose) after you have not eaten for a while (fasting). You may have this done every 1-3 years.  Abdominal aortic aneurysm (AAA) screening. You may need this if you are a current or former smoker.  Osteoporosis. You may be screened starting at age 65 if you are at high risk. Talk with your health care provider about your test results, treatment options, and if necessary, the need for more tests. Vaccines  Your health care provider may recommend certain vaccines, such as:  Influenza vaccine. This is recommended every year.  Tetanus, diphtheria, and acellular pertussis (Tdap, Td) vaccine. You may need a Td booster every 10 years.  Zoster vaccine. You may need this after age 43.  Pneumococcal 13-valent conjugate (PCV13) vaccine. One dose is recommended after age 5.  Pneumococcal polysaccharide (PPSV23) vaccine. One dose is recommended after age 54. Talk to your health care provider about which screenings and vaccines you need and how often you need them. This information is not intended to replace advice given to you by your health care provider. Make sure you discuss any questions you have with your health care provider. Document Released: 07/16/2015 Document Revised: 03/08/2016 Document Reviewed: 04/20/2015 Elsevier Interactive Patient Education  2017 [place] Prevention in the Home Falls can cause injuries. They can happen to people of all ages. There are many things you can do to make your home safe and to help prevent falls. What can I do on the outside of my home?  Regularly fix the edges of walkways and driveways and fix any cracks.  Remove anything that might make you trip as you  walk through a door, such as a raised step or threshold.  Trim any bushes or trees on the path to your home.  Use bright outdoor lighting.  Clear any walking paths of anything that might make someone trip, such as rocks or tools.  Regularly check to see if handrails are loose or broken. Make sure that both sides of any steps have handrails.  Any raised decks and porches should have guardrails on the edges.  Have any leaves, snow, or ice cleared regularly.  Use sand or salt on walking paths during winter.  Clean up any spills in your garage right away. This includes oil or grease spills. What can I do in the bathroom?  Use night lights.  Install grab bars by the toilet and in the tub and shower. Do not use towel bars as grab bars.  Use non-skid mats or decals in the tub or shower.  If you need to sit down in the shower, use a plastic, non-slip stool.  Keep the floor dry. Clean up any water that spills on the floor as soon as it happens.  Remove soap buildup in the tub or shower regularly.  Attach bath mats securely with double-sided non-slip rug tape.  Do not have throw rugs and other things on the floor that can make you trip. What can I do in  the bedroom?  Use night lights.  Make sure that you have a light by your bed that is easy to reach.  Do not use any sheets or blankets that are too big for your bed. They should not hang down onto the floor.  Have a firm chair that has side arms. You can use this for support while you get dressed.  Do not have throw rugs and other things on the floor that can make you trip. What can I do in the kitchen?  Clean up any spills right away.  Avoid walking on wet floors.  Keep items that you use a lot in easy-to-reach places.  If you need to reach something above you, use a strong step stool that has a grab bar.  Keep electrical cords out of the way.  Do not use floor polish or wax that makes floors slippery. If you must use  wax, use non-skid floor wax.  Do not have throw rugs and other things on the floor that can make you trip. What can I do with my stairs?  Do not leave any items on the stairs.  Make sure that there are handrails on both sides of the stairs and use them. Fix handrails that are broken or loose. Make sure that handrails are as long as the stairways.  Check any carpeting to make sure that it is firmly attached to the stairs. Fix any carpet that is loose or worn.  Avoid having throw rugs at the top or bottom of the stairs. If you do have throw rugs, attach them to the floor with carpet tape.  Make sure that you have a light switch at the top of the stairs and the bottom of the stairs. If you do not have them, ask someone to add them for you. What else can I do to help prevent falls?  Wear shoes that:  Do not have high heels.  Have rubber bottoms.  Are comfortable and fit you well.  Are closed at the toe. Do not wear sandals.  If you use a stepladder:  Make sure that it is fully opened. Do not climb a closed stepladder.  Make sure that both sides of the stepladder are locked into place.  Ask someone to hold it for you, if possible.  Clearly mark and make sure that you can see:  Any grab bars or handrails.  First and last steps.  Where the edge of each step is.  Use tools that help you move around (mobility aids) if they are needed. These include:  Canes.  Walkers.  Scooters.  Crutches.  Turn on the lights when you go into a dark area. Replace any light bulbs as soon as they burn out.  Set up your furniture so you have a clear path. Avoid moving your furniture around.  If any of your floors are uneven, fix them.  If there are any pets around you, be aware of where they are.  Review your medicines with your doctor. Some medicines can make you feel dizzy. This can increase your chance of falling. Ask your doctor what other things that you can do to help prevent  falls. This information is not intended to replace advice given to you by your health care provider. Make sure you discuss any questions you have with your health care provider. Document Released: 04/15/2009 Document Revised: 11/25/2015 Document Reviewed: 07/24/2014 Elsevier Interactive Patient Education  2017 [employer].

## 2019-10-02 ENCOUNTER — Other Ambulatory Visit: Payer: Self-pay | Admitting: Internal Medicine

## 2019-10-15 DIAGNOSIS — N1832 Chronic kidney disease, stage 3b: Secondary | ICD-10-CM | POA: Diagnosis not present

## 2019-10-17 ENCOUNTER — Other Ambulatory Visit: Payer: Self-pay | Admitting: Internal Medicine

## 2019-10-17 ENCOUNTER — Telehealth: Payer: Self-pay | Admitting: Gastroenterology

## 2019-10-17 NOTE — Telephone Encounter (Signed)
Patient made aware that [place] samples have been left at front desk for pick.  Patient agreed to plan and verbalized understanding.  No further questions.

## 2019-10-23 DIAGNOSIS — N1832 Chronic kidney disease, stage 3b: Secondary | ICD-10-CM | POA: Diagnosis not present

## 2019-10-23 DIAGNOSIS — D631 Anemia in chronic kidney disease: Secondary | ICD-10-CM | POA: Diagnosis not present

## 2019-10-23 DIAGNOSIS — I129 Hypertensive chronic kidney disease with stage 1 through stage 4 chronic kidney disease, or unspecified chronic kidney disease: Secondary | ICD-10-CM | POA: Diagnosis not present

## 2019-10-23 DIAGNOSIS — N2581 Secondary hyperparathyroidism of renal origin: Secondary | ICD-10-CM | POA: Diagnosis not present

## 2019-11-13 ENCOUNTER — Ambulatory Visit (INDEPENDENT_AMBULATORY_CARE_PROVIDER_SITE_OTHER)
Admission: RE | Admit: 2019-11-13 | Discharge: 2019-11-13 | Disposition: A | Discharge status: Home / Self Care | Payer: Medicare PPO | Source: Ambulatory Visit | Attending: Internal Medicine | Admitting: Internal Medicine

## 2019-11-13 ENCOUNTER — Other Ambulatory Visit: Payer: Self-pay

## 2019-11-13 DIAGNOSIS — R918 Other nonspecific abnormal finding of lung field: Secondary | ICD-10-CM | POA: Diagnosis not present

## 2019-11-16 ENCOUNTER — Encounter: Payer: Self-pay | Admitting: Internal Medicine

## 2019-11-17 ENCOUNTER — Other Ambulatory Visit: Payer: Self-pay | Admitting: Internal Medicine

## 2019-11-21 ENCOUNTER — Encounter: Payer: Self-pay | Admitting: Internal Medicine

## 2019-11-24 ENCOUNTER — Other Ambulatory Visit: Payer: Self-pay | Admitting: Internal Medicine

## 2019-11-25 ENCOUNTER — Other Ambulatory Visit: Payer: Self-pay | Admitting: Internal Medicine

## 2019-11-27 NOTE — Progress Notes (Signed)
Subjective:    Patient ID: Noah Barnes, male    DOB: [birth year], 74 y.o.   MRN: [id number]  HPI The patient is here for an acute visit.  Abdominal pain:  He is having LLQ pain for the past 2 days.  The pain is constant.  He has had less stool than usual.    Chills yest.  No fever.  A little less hungry.  Has not taken anything.  Can feel it worse with bump in car.  Has known diverticulosis, but has never had diverticulitis.    Medications and allergies reviewed with patient and updated if appropriate.  Patient Active Problem List   Diagnosis Date Noted  . Abnormal findings on diagnostic imaging of lung 04/04/2019  . History of DVT of lower extremity, b/l 04/03/2019  . History of pulmonary embolus (PE) 04/03/2019  . Hypomagnesemia 04/03/2019  . HCAP (healthcare-associated pneumonia) 03/24/2019  . Nephrolithiasis 03/06/2019  . Seasonal allergic rhinitis 05/22/2018  . Paronychia of great toe of left foot 05/22/2018  . MR (mitral regurgitation) 12/20/2017  . CKD (chronic kidney disease) stage 3, GFR 30-59 ml/min 08/14/2017  . Abdominal hernia without obstruction and without gangrene 01/10/2016  . Chronic diastolic heart failure ([place]) 02/25/2015  . Right renal mass 02/25/2015  . Other constipation 02/22/2015  . Chronic venous insufficiency 01/18/2015  . Superficial phlebitis 01/18/2015  . IBS (irritable bowel syndrome) 11/12/2013  . Family history of prostate cancer 12/26/2011  . Diabetes mellitus with renal complications ([place]) 65/68/1275  . Gout 12/31/2007  . HYPERLIPIDEMIA 08/14/2007  . ERECTILE DYSFUNCTION 05/09/2007  . Essential hypertension 11/28/2006    Current Outpatient Medications on File Prior to Visit  Medication Sig Dispense Refill  . Accu-Chek FastClix Lancets MISC Use Accu Chek fastclix lancets to check blood sugar twice daily. 100 each 2  . allopurinol (ZYLOPRIM) 300 MG tablet Take 1 tablet (300 mg total) by mouth daily. 90 tablet 1  . apixaban  (ELIQUIS) 2.5 MG TABS tablet Take 1 tablet (2.5 mg total) by mouth 2 (two) times daily. 180 tablet 3  . atorvastatin (LIPITOR) 20 MG tablet Take 1 tablet by mouth once daily 90 tablet 0  . Blood Glucose Monitoring Suppl (ACCU-CHEK GUIDE ME) w/Device KIT 1 each by Does not apply route in the morning and at bedtime. Use Accu Chek Guide me device to check blood sugar twice daily. 1 kit 0  . FERREX 150 150 MG capsule Take 1 capsule by mouth once daily 90 capsule 0  . glimepiride (AMARYL) 4 MG tablet Take 1 tablet (4 mg total) by mouth daily before supper. 90 tablet 0  . glucose blood (ACCU-CHEK GUIDE) test strip Use Accu Chek Guide test strips as instructed to check blood sugar twice daily. 100 each 2  . hydrALAZINE (APRESOLINE) 50 MG tablet Take 1 tablet by mouth twice daily 180 tablet 1  . LINZESS 290 MCG CAPS capsule TAKE 1 CAPSULE BY MOUTH ONCE DAILY BEFORE BREAKFAST 30 capsule 2  . sildenafil (VIAGRA) 100 MG tablet TAKE 1 TABLET BY MOUTH AS NEEDED FOR ERECTILE DYSFUNCTION 6 tablet 1  . sitaGLIPtin (JANUVIA) 50 MG tablet Take 1 tablet (50 mg total) by mouth daily. 90 tablet 1  . tamsulosin (FLOMAX) 0.4 MG CAPS capsule TAKE 1 CAPSULE BY MOUTH ONCE DAILY AFTER  SUPPER 90 capsule 1   No current facility-administered medications on file prior to visit.    Past Medical History:  Diagnosis Date  . CHF (congestive heart failure) ([place])   .  Chronic diastolic heart failure ([place])   . Constipation   . Diabetes mellitus   . Diverticulosis   . ED (erectile dysfunction)   . Gout   . Heart murmur   . Hyperlipidemia   . Hypertension   . IBS (irritable bowel syndrome)   . Renal calculus   . Renal insufficiency   . Tubular adenoma of colon     Past Surgical History:  Procedure Laterality Date  . COLONOSCOPY  1998   negative; no F/U  . EXTRACORPOREAL SHOCK WAVE LITHOTRIPSY Left 03/20/2019   Procedure: EXTRACORPOREAL SHOCK WAVE LITHOTRIPSY (ESWL);  Surgeon: Cleon Gustin, MD;  Location: [facility];   Service: Urology;  Laterality: Left;  . knee effusion tapped      Social History   Socioeconomic History  . Marital status: Married    Spouse name: Not on file  . Number of children: 1  . Years of education: Not on file  . Highest education level: Not on file  Occupational History  . Occupation: Metallurgist: [employer]    Comment: [phone]  Tobacco Use  . Smoking status: Former Smoker    Packs/day: 0.30    Types: Cigarettes    Quit date: 07/04/1991    Years since quitting: 28.4  . Smokeless tobacco: Never Used  . Tobacco comment: smoked age [age], up to 1/3 ppd; may be less  Substance and Sexual Activity  . Alcohol use: Yes    Alcohol/week: 2.0 standard drinks    Types: 2 Glasses of wine per week    Comment: rare  . Drug use: No  . Sexual activity: Yes  Other Topics Concern  . Not on file  Social History Narrative  . Not on file   Social Determinants of Health   Financial Resource Strain:   . Difficulty of Paying Living Expenses:   Food Insecurity:   . Worried About Charity fundraiser in the Last Year:   . Arboriculturist in the Last Year:   Transportation Needs:   . Film/video editor (Medical):   [name] Lack of Transportation (Non-Medical):   Physical Activity:   . Days of Exercise per Week:   . Minutes of Exercise per Session:   Stress:   . Feeling of Stress :   Social Connections:   . Frequency of Communication with Friends and Family:   . Frequency of Social Gatherings with Friends and Family:   . Attends Religious Services:   . Active Member of Clubs or Organizations:   . Attends Archivist Meetings:   [name] Marital Status:     Family History  Problem Relation Age of Onset  . Heart attack Mother 16  . Hypertension Mother   . Prostate cancer Father 33  . Diabetes Father   . Irritable bowel syndrome Father   . Colon cancer Brother        dx in his late 36's  . Irritable bowel syndrome Sister        x 2    . Irritable bowel syndrome Brother   . Stroke Neg Hx   . Esophageal cancer Neg Hx   . Rectal cancer Neg Hx   . Stomach cancer Neg Hx     Review of Systems  Constitutional: Positive for appetite change (decreased) and chills. Negative for fever.  Gastrointestinal: Positive for abdominal pain (LLQ). Negative for blood in stool, constipation, diarrhea and nausea.  Genitourinary: Negative for  difficulty urinating, dysuria and hematuria.       Objective:   Vitals:   11/28/19 1037  BP: 122/70  Pulse: (!) 58  Resp: 16  Temp: 99.4 F (37.4 C)  SpO2: 95%   BP Readings from Last 3 Encounters:  11/28/19 122/70  09/29/19 122/70  09/23/19 135/68   Wt Readings from Last 3 Encounters:  11/28/19 222 lb 6.4 oz (100.9 kg)  09/29/19 217 lb 3.2 oz (98.5 kg)  09/23/19 220 lb 12.8 oz (100.2 kg)   Body mass index is 33.82 kg/m.   Physical Exam Constitutional:      General: He is not in acute distress.    Appearance: He is well-developed. He is not ill-appearing.  HENT:     Head: Normocephalic and atraumatic.  Abdominal:     Palpations: Abdomen is soft.     Tenderness: There is abdominal tenderness in the left lower quadrant. There is guarding (mild). There is no rebound.     Hernia: A hernia is present. Hernia is present in the umbilical area and ventral area.     Comments: obese  Skin:    General: Skin is warm and dry.  Neurological:     Mental Status: He is alert.            Assessment & Plan:    See Problem List for Assessment and Plan of chronic medical problems.    This visit occurred during the SARS-CoV-2 public health emergency.  Safety protocols were in place, including screening questions prior to the visit, additional usage of staff PPE, and extensive cleaning of exam room while observing appropriate contact time as indicated for disinfecting solutions.

## 2019-11-28 ENCOUNTER — Ambulatory Visit: Payer: Medicare PPO | Admitting: Internal Medicine

## 2019-11-28 ENCOUNTER — Ambulatory Visit (INDEPENDENT_AMBULATORY_CARE_PROVIDER_SITE_OTHER)
Admission: RE | Admit: 2019-11-28 | Discharge: 2019-11-28 | Disposition: A | Discharge status: Home / Self Care | Payer: Medicare PPO | Source: Ambulatory Visit | Attending: Internal Medicine | Admitting: Internal Medicine

## 2019-11-28 ENCOUNTER — Encounter: Payer: Self-pay | Admitting: Internal Medicine

## 2019-11-28 ENCOUNTER — Other Ambulatory Visit: Payer: Self-pay

## 2019-11-28 DIAGNOSIS — R1032 Left lower quadrant pain: Secondary | ICD-10-CM

## 2019-11-28 DIAGNOSIS — R109 Unspecified abdominal pain: Secondary | ICD-10-CM | POA: Diagnosis not present

## 2019-11-28 LAB — CBC WITH DIFFERENTIAL/PLATELET
Basophils Absolute: 0 10*3/uL (ref 0.0–0.1)
Basophils Relative: 0.3 % (ref 0.0–3.0)
Eosinophils Absolute: 0 10*3/uL (ref 0.0–0.7)
Eosinophils Relative: 0.4 % (ref 0.0–5.0)
HCT: 38.5 % — ABNORMAL LOW (ref 39.0–52.0)
Hemoglobin: 12.3 g/dL — ABNORMAL LOW (ref 13.0–17.0)
Lymphocytes Relative: 8.4 % — ABNORMAL LOW (ref 12.0–46.0)
Lymphs Abs: 0.6 10*3/uL — ABNORMAL LOW (ref 0.7–4.0)
MCHC: 32 g/dL (ref 30.0–36.0)
MCV: 84.5 fl (ref 78.0–100.0)
Monocytes Absolute: 0.6 10*3/uL (ref 0.1–1.0)
Monocytes Relative: 8.6 % (ref 3.0–12.0)
Neutro Abs: 6.2 10*3/uL (ref 1.4–7.7)
Neutrophils Relative %: 82.3 % — ABNORMAL HIGH (ref 43.0–77.0)
Platelets: 174 10*3/uL (ref 150.0–400.0)
RBC: 4.56 Mil/uL (ref 4.22–5.81)
RDW: 15.5 % (ref 11.5–15.5)
WBC: 7.6 10*3/uL (ref 4.0–10.5)

## 2019-11-28 LAB — BASIC METABOLIC PANEL
BUN: 35 mg/dL — ABNORMAL HIGH (ref 6–23)
CO2: 20 mEq/L (ref 19–32)
Calcium: 9.4 mg/dL (ref 8.4–10.5)
Chloride: 111 mEq/L (ref 96–112)
Creatinine, Ser: 1.84 mg/dL — ABNORMAL HIGH (ref 0.40–1.50)
GFR: 43.86 mL/min — ABNORMAL LOW (ref >60.00)
Glucose, Bld: 133 mg/dL — ABNORMAL HIGH (ref 70–99)
Potassium: 4.2 mEq/L (ref 3.5–5.1)
Sodium: 139 mEq/L (ref 135–145)

## 2019-11-28 MED ORDER — IOHEXOL 300 MG/ML  SOLN
80.0000 mL | Freq: Once | INTRAMUSCULAR | Status: AC | PRN
  Administered 2019-11-28: 80 mL via INTRAVENOUS

## 2019-11-28 MED ORDER — AMOXICILLIN-POT CLAVULANATE 875-125 MG PO TABS: 1.0000 | ORAL_TABLET | Freq: Two times a day (BID) | ORAL | 0 refills | Status: DC

## 2019-11-28 NOTE — Patient Instructions (Signed)
Blood work was ordered.    Ct scan ordered   Medications reviewed and updated.  Changes include :   Augmentin for 10 days.  Your prescription(s) have been submitted to your pharmacy. Please take as directed and contact our office if you believe you are having problem(s) with the medication(s).   Start a low fiber diet until you complete the antibiotic.    Diverticulitis  Diverticulitis is infection or inflammation of small pouches (diverticula) in the colon that form due to a condition called diverticulosis. Diverticula can trap stool (feces) and bacteria, causing infection and inflammation. Diverticulitis may cause severe stomach pain and diarrhea. It may lead to tissue damage in the colon that causes bleeding. The diverticula may also burst (rupture) and cause infected stool to enter other areas of the abdomen. Complications of diverticulitis can include:  Bleeding.  Severe infection.  Severe pain.  Rupture (perforation) of the colon.  Blockage (obstruction) of the colon. What are the causes? This condition is caused by stool becoming trapped in the diverticula, which allows bacteria to grow in the diverticula. This leads to inflammation and infection. What increases the risk? You are more likely to develop this condition if:  You have diverticulosis. The risk for diverticulosis increases if: ? You are overweight or obese. ? You use tobacco products. ? You do not get enough exercise.  You eat a diet that does not include enough fiber. High-fiber foods include fruits, vegetables, beans, nuts, and whole grains. What are the signs or symptoms? Symptoms of this condition may include:  Pain and tenderness in the abdomen. The pain is normally located on the left side of the abdomen, but it may occur in other areas.  Fever and chills.  Bloating.  Cramping.  Nausea.  Vomiting.  Changes in bowel routines.  Blood in your stool. How is this diagnosed? This  condition is diagnosed based on:  Your medical history.  A physical exam.  Tests to make sure there is nothing else causing your condition. These tests may include: ? Blood tests. ? Urine tests. ? Imaging tests of the abdomen, including X-rays, ultrasounds, MRIs, or CT scans. How is this treated? Most cases of this condition are mild and can be treated at home. Treatment may include:  Taking over-the-counter pain medicines.  Following a clear liquid diet.  Taking antibiotic medicines by mouth.  Rest. More severe cases may need to be treated at a hospital. Treatment may include:  Not eating or drinking.  Taking prescription pain medicine.  Receiving antibiotic medicines through an IV tube.  Receiving fluids and nutrition through an IV tube.  Surgery. When your condition is under control, your health care provider may recommend that you have a colonoscopy. This is an exam to look at the entire large intestine. During the exam, a lubricated, bendable tube is inserted into the anus and then passed into the rectum, colon, and other parts of the large intestine. A colonoscopy can show how severe your diverticula are and whether something else may be causing your symptoms. Follow these instructions at home: Medicines  Take over-the-counter and prescription medicines only as told by your health care provider. These include fiber supplements, probiotics, and stool softeners.  If you were prescribed an antibiotic medicine, take it as told by your health care provider. Do not stop taking the antibiotic even if you start to feel better.  Do not drive or use heavy machinery while taking prescription pain medicine. General instructions   Follow a full  liquid diet or another diet as directed by your health care provider. After your symptoms improve, your health care provider may tell you to change your diet. He or she may recommend that you eat a diet that contains at least 25 g (25  grams) of fiber daily. Fiber makes it easier to pass stool. Healthy sources of fiber include: ? Berries. One cup contains 4-8 grams of fiber. ? Beans or lentils. One half cup contains 5-8 grams of fiber. ? Green vegetables. One cup contains 4 grams of fiber.  Exercise for at least 30 minutes, 3 times each week. You should exercise hard enough to raise your heart rate and break a sweat.  Keep all follow-up visits as told by your health care provider. This is important. You may need a colonoscopy. Contact a health care provider if:  Your pain does not improve.  You have a hard time drinking or eating food.  Your bowel movements do not return to normal. Get help right away if:  Your pain gets worse.  Your symptoms do not get better with treatment.  Your symptoms suddenly get worse.  You have a fever.  You vomit more than one time.  You have stools that are bloody, black, or tarry. Summary  Diverticulitis is infection or inflammation of small pouches (diverticula) in the colon that form due to a condition called diverticulosis. Diverticula can trap stool (feces) and bacteria, causing infection and inflammation.  You are at higher risk for this condition if you have diverticulosis and you eat a diet that does not include enough fiber.  Most cases of this condition are mild and can be treated at home. More severe cases may need to be treated at a hospital.  When your condition is under control, your health care provider may recommend that you have an exam called a colonoscopy. This exam can show how severe your diverticula are and whether something else may be causing your symptoms. This information is not intended to replace advice given to you by your health care provider. Make sure you discuss any questions you have with your health care provider. Document Revised: 06/01/2017 Document Reviewed: 07/22/2016 Elsevier Patient Education  2020 [employer].

## 2019-11-28 NOTE — Assessment & Plan Note (Signed)
Acute Started 2 days ago, constant Some decrease in stool, dec appetite, chills Concern for diverticulitis Ct today Cbc, cmp today Start augmentin Low fiber diet Will fu with GI this summer - due for colonoscopy

## 2019-11-30 ENCOUNTER — Encounter: Payer: Self-pay | Admitting: Internal Medicine

## 2019-12-16 DIAGNOSIS — H2513 Age-related nuclear cataract, bilateral: Secondary | ICD-10-CM | POA: Diagnosis not present

## 2019-12-16 DIAGNOSIS — E119 Type 2 diabetes mellitus without complications: Secondary | ICD-10-CM | POA: Diagnosis not present

## 2019-12-16 DIAGNOSIS — H35033 Hypertensive retinopathy, bilateral: Secondary | ICD-10-CM | POA: Diagnosis not present

## 2019-12-16 LAB — HM DIABETES EYE EXAM

## 2019-12-22 ENCOUNTER — Other Ambulatory Visit: Payer: Self-pay

## 2019-12-22 ENCOUNTER — Other Ambulatory Visit (INDEPENDENT_AMBULATORY_CARE_PROVIDER_SITE_OTHER): Payer: Medicare PPO

## 2019-12-22 DIAGNOSIS — E1169 Type 2 diabetes mellitus with other specified complication: Secondary | ICD-10-CM | POA: Diagnosis not present

## 2019-12-22 DIAGNOSIS — E669 Obesity, unspecified: Secondary | ICD-10-CM

## 2019-12-22 LAB — COMPREHENSIVE METABOLIC PANEL
ALT: 60 U/L — ABNORMAL HIGH (ref 0–53)
AST: 24 U/L (ref 0–37)
Albumin: 3.9 g/dL (ref 3.5–5.2)
Alkaline Phosphatase: 61 U/L (ref 39–117)
BUN: 31 mg/dL — ABNORMAL HIGH (ref 6–23)
CO2: 22 mEq/L (ref 19–32)
Calcium: 9.2 mg/dL (ref 8.4–10.5)
Chloride: 110 mEq/L (ref 96–112)
Creatinine, Ser: 1.54 mg/dL — ABNORMAL HIGH (ref 0.40–1.50)
GFR: 53.85 mL/min — ABNORMAL LOW (ref >60.00)
Glucose, Bld: 139 mg/dL — ABNORMAL HIGH (ref 70–99)
Potassium: 4.5 mEq/L (ref 3.5–5.1)
Sodium: 138 mEq/L (ref 135–145)
Total Bilirubin: 0.6 mg/dL (ref 0.2–1.2)
Total Protein: 6.6 g/dL (ref 6.0–8.3)

## 2019-12-22 LAB — HEMOGLOBIN A1C: Hgb A1c MFr Bld: 6.4 % (ref 4.6–6.5)

## 2019-12-24 ENCOUNTER — Other Ambulatory Visit: Payer: Self-pay

## 2019-12-24 ENCOUNTER — Ambulatory Visit: Payer: Medicare PPO | Admitting: Endocrinology

## 2019-12-24 ENCOUNTER — Encounter: Payer: Self-pay | Admitting: Endocrinology

## 2019-12-24 VITALS — BP 150/90 | HR 40 | Ht 68.0 in | Wt 223.6 lb

## 2019-12-24 DIAGNOSIS — E1129 Type 2 diabetes mellitus with other diabetic kidney complication: Secondary | ICD-10-CM | POA: Diagnosis not present

## 2019-12-24 DIAGNOSIS — I10 Essential (primary) hypertension: Secondary | ICD-10-CM

## 2019-12-24 NOTE — Patient Instructions (Addendum)
Rybelsus:  take the capsules on empty stomach 30 minutes before breakfast with 4 ounces of water daily.  Check blood sugars on waking up 3-4 days a week  Also check blood sugars about 2 hours after meals and do this after different meals by rotation  Recommended blood sugar levels on waking up are 90-130 and about 2 hours after meal is 130-160  Please bring your blood sugar monitor to each visit, thank you  Call in 4 weeks

## 2019-12-24 NOTE — Progress Notes (Signed)
Medication Sample . Pt was given sample of Ryblesus per Dr. Ronnie Derby 's instructions on 12/24/2019.  . Medication was signed out according to sample medication policy.  . Medication was labeled using approved label. . Medication sample was added to patient's medication list as a sample medication that had been given. . Medication was explained to patient and patient verbalized understanding of how to take properly.  . Pt was also instructed to call this office if any questions or concerns arise. Pt did verbalize understanding of this.  Lot number for medication is: [id number]  Expiration for medication is: 07/03/2020

## 2019-12-24 NOTE — Progress Notes (Signed)
Patient ID: Noah Barnes, male   DOB: 01-20-1946, 74 y.o.   MRN: [id number]    Reason for Appointment : Follow-up visit  History of Present Illness          Diagnosis: Type 2 diabetes mellitus, date of diagnosis: 2008       Past history: His diabetes was borderline in the beginning and not clear what medications he was started on. Has been taking metformin for several years and this was later changed to [place]. Amaryl probably started in 5/14 when blood sugars were higher, initially was given 1 mg and now has been taking 2 mg. He had a relatively high A1c of 7.1, difficulty losing weight and a glucose of 202 on his initial consultation in 06/2013 He was then tried on Invokana in addition to his Janumet and Amaryl Invokana was reduced to 100 mg in early 2015 been creatinine had gone up to 1.6  Recent history:   Oral hypoglycemic drugs the patient is taking are: Amaryl 4 mg at supper, Januvia 50 mg   His A1c is 6.4, previous range 5.8-6.6  Current management, blood sugar patterns and problems identified:     He has started exercising and going to the [employer] 3 to 4 days a week and does mostly walking but overall exercising 45 minutes.  However he appears to have gained weight significantly since last year  His blood sugars are variable and as high as 203 postprandially  He is not clear what makes his blood sugar go up  Does not think he is eating large portions or sweets  He prefers to use generic or low cost medication but is paying $30 apparently for his Januvia monthly  Side effects from medications have been:  renal dysfunction from Invokana  Glucose monitoring:  done <1 times a day         Glucometer:  Accu-Chek   Blood Glucose readings from Accu-Chek download   PRE-MEAL Fasting Lunch Dinner Bedtime Overall  Glucose range:  90-181    110-203   Mean/median:  138    146  143     Previous range 114-163 Has only 2 readings over 145 likely after his main  meal at lunchtime; no average available    Glycemic control:   Lab Results  Component Value Date   HGBA1C 6.4 12/22/2019   HGBA1C 6.6 (H) 08/18/2019   HGBA1C 6.4 05/15/2019   Lab Results  Component Value Date   MICROALBUR 3.5 (H) 05/15/2019   LDLCALC 61 05/15/2019   CREATININE 1.54 (H) 12/22/2019    Self-care: Usually has low-fat diet, eggs in am with some carbohydrate, frequently no lunch     Dietician consultations: 08/2013              Weight history:   Wt Readings from Last 3 Encounters:  12/24/19 223 lb 9.6 oz (101.4 kg)  11/28/19 222 lb 6.4 oz (100.9 kg)  09/29/19 217 lb 3.2 oz (98.5 kg)       Allergies as of 12/24/2019      Reactions   Ace Inhibitors Other (See Comments)   Severe AKI due to ARB + dehydration Aug 2016   Angiotensin Receptor Blockers Other (See Comments)   Severe AKI due to ARB + dehydration Aug 2016      Medication List       Accurate as of December 24, 2019 11:02 AM. If you have any questions, ask your nurse or doctor.  STOP taking these medications   amoxicillin-clavulanate 875-125 MG tablet Commonly known as: AUGMENTIN Stopped by: Elayne Snare, MD     TAKE these medications   Accu-Chek FastClix Lancets Misc Use Accu Chek fastclix lancets to check blood sugar twice daily.   Accu-Chek Guide Me w/Device Kit 1 each by Does not apply route in the morning and at bedtime. Use Accu Chek Guide me device to check blood sugar twice daily.   Accu-Chek Guide test strip Generic drug: glucose blood Use Accu Chek Guide test strips as instructed to check blood sugar twice daily.   allopurinol 300 MG tablet Commonly known as: ZYLOPRIM Take 1 tablet (300 mg total) by mouth daily.   apixaban 2.5 MG Tabs tablet Commonly known as: Eliquis Take 1 tablet (2.5 mg total) by mouth 2 (two) times daily.   atorvastatin 20 MG tablet Commonly known as: LIPITOR Take 1 tablet by mouth once daily   Ferrex 150 150 MG capsule Generic drug: iron  polysaccharides Take 1 capsule by mouth once daily   glimepiride 4 MG tablet Commonly known as: AMARYL Take 1 tablet (4 mg total) by mouth daily before supper.   hydrALAZINE 50 MG tablet Commonly known as: APRESOLINE Take 1 tablet by mouth twice daily   Linzess 290 MCG Caps capsule Generic drug: linaclotide TAKE 1 CAPSULE BY MOUTH ONCE DAILY BEFORE BREAKFAST   sildenafil 100 MG tablet Commonly known as: VIAGRA TAKE 1 TABLET BY MOUTH AS NEEDED FOR ERECTILE DYSFUNCTION   sitaGLIPtin 50 MG tablet Commonly known as: Januvia Take 1 tablet (50 mg total) by mouth daily.   tamsulosin 0.4 MG Caps capsule Commonly known as: FLOMAX TAKE 1 CAPSULE BY MOUTH ONCE DAILY AFTER  SUPPER       Allergies:  Allergies  Allergen Reactions  . Ace Inhibitors Other (See Comments)    Severe AKI due to ARB + dehydration Aug 2016  . Angiotensin Receptor Blockers Other (See Comments)    Severe AKI due to ARB + dehydration Aug 2016    Past Medical History:  Diagnosis Date  . CHF (congestive heart failure) ([place])   . Chronic diastolic heart failure ([place])   . Constipation   . Diabetes mellitus   . Diverticulosis   . ED (erectile dysfunction)   . Gout   . Heart murmur   . Hyperlipidemia   . Hypertension   . IBS (irritable bowel syndrome)   . Renal calculus   . Renal insufficiency   . Tubular adenoma of colon     Past Surgical History:  Procedure Laterality Date  . COLONOSCOPY  1998   negative; no F/U  . EXTRACORPOREAL SHOCK WAVE LITHOTRIPSY Left 03/20/2019   Procedure: EXTRACORPOREAL SHOCK WAVE LITHOTRIPSY (ESWL);  Surgeon: Cleon Gustin, MD;  Location: [facility];  Service: Urology;  Laterality: Left;  . knee effusion tapped      Family History  Problem Relation Age of Onset  . Heart attack Mother 13  . Hypertension Mother   . Prostate cancer Father 59  . Diabetes Father   . Irritable bowel syndrome Father   . Colon cancer Brother        dx in his late 79's  . Irritable  bowel syndrome Sister        x 2  . Irritable bowel syndrome Brother   . Stroke Neg Hx   . Esophageal cancer Neg Hx   . Rectal cancer Neg Hx   . Stomach cancer Neg Hx     Social  History:  reports that he quit smoking about 28 years ago. His smoking use included cigarettes. He smoked 0.30 packs per day. He has never used smokeless tobacco. He reports current alcohol use of about 2.0 standard drinks of alcohol per week. He reports that he does not use drugs.    Review of Systems   RENAL dysfunction: Creatinine is  variable  Lab Results  Component Value Date   CREATININE 1.54 (H) 12/22/2019   CREATININE 1.84 (H) 11/28/2019   CREATININE 1.99 (H) 09/23/2019   CREATININE 1.96 (H) 08/18/2019     Lab Results  Component Value Date   K 4.5 12/22/2019          Lipids: Has been on Lipitor 20 mg for hyperlipidemia, baseline LDL about 162 Followed by Dr. Quay Burow  Lab Results  Component Value Date   CHOL 117 05/15/2019   HDL 40.70 05/15/2019   LDLCALC 61 05/15/2019   LDLDIRECT 80.1 04/15/2014   TRIG 80.0 05/15/2019   CHOLHDL 3 05/15/2019       The blood pressure has been high for several years and usually well controlled.  Currently taking, hydralazine, and metoprolol.  Blood pressure at home 096-045 systolic and he thinks it is higher today because of not taking his medication this morning This is followed by his PCP  BP Readings from Last 3 Encounters:  12/24/19 (!) 150/90  11/28/19 122/70  09/29/19 122/70      LABS:  Lab on 12/22/2019  Component Date Value Ref Range Status  . Sodium 12/22/2019 138  135 - 145 mEq/L Final  . Potassium 12/22/2019 4.5  3.5 - 5.1 mEq/L Final  . Chloride 12/22/2019 110  96 - 112 mEq/L Final  . CO2 12/22/2019 22  19 - 32 mEq/L Final  . Glucose, Bld 12/22/2019 139* 70 - 99 mg/dL Final  . BUN 12/22/2019 31* 6 - 23 mg/dL Final  . Creatinine, Ser 12/22/2019 1.54* 0.40 - 1.50 mg/dL Final  . Total Bilirubin 12/22/2019 0.6  0.2 - 1.2 mg/dL  Final  . Alkaline Phosphatase 12/22/2019 61  39 - 117 U/L Final  . AST 12/22/2019 24  0 - 37 U/L Final  . ALT 12/22/2019 60* 0 - 53 U/L Final  . Total Protein 12/22/2019 6.6  6.0 - 8.3 g/dL Final  . Albumin 12/22/2019 3.9  3.5 - 5.2 g/dL Final  . GFR 12/22/2019 53.85* >60.00 mL/min Final  . Calcium 12/22/2019 9.2  8.4 - 10.5 mg/dL Final  . Hgb A1c MFr Bld 12/22/2019 6.4  4.6 - 6.5 % Final   Glycemic Control Guidelines for People with Diabetes:Non Diabetic:  <6%Goal of Therapy: <7%Additional Action Suggested:  >8%     Physical Examination:  BP (!) 150/90 (BP Location: Left Arm, Patient Position: Sitting, Cuff Size: Normal)   Pulse (!) 40   Ht '5\' 8"'  (1.727 m)   Wt 223 lb 9.6 oz (101.4 kg)   SpO2 97%   BMI 34.00 kg/m        ASSESSMENT/PLAN:   Diabetes type 2, with obesity  See history of present illness for detailed discussion of current diabetes management, blood sugar patterns and problems identified  He is on Januvia 50 mg and Amaryl 4 mg  A1c is 6.4  Although his blood sugars are generally well controlled with 4 mg Amaryl and 50 mg Januvia he has gained a lot of weight Also because of his cardiovascular risk he would benefit from a GLP-1 drug instead of Januvia  Discussed with the  patient the nature of GLP-1 drugs, the actions on insulin secretion, slowing stomach emptying, reduction of appetite and reduced liver glucose production Explained that Rybelsus improves blood sugar control as well as produces weight loss and reduces cardiovascular events. Explained possible side effects especially nausea and vomiting that may occur in the first few days; usually side effects improve with time. He was asked to cut back on portions to avoid any potential nausea Patient to call if nausea or vomiting does not improve within 2 weeks Instructed to take the capsules on empty stomach 30 minutes before breakfast with 4 ounces of water daily.   Patient education material given SAMPLES  of 3 mg given for 30 days  He will call in about a month to let us know if he is having any side effects and whether his blood sugars are improved, if need be increased to 7 mg  RENAL dysfunction: Creatinine relatively better recently, this is not related to diabetes since his microalbumin has been normal   HYPERTENSION: He will follow-up with his PCP next month  Follow-up in 2 months  There are no Patient Instructions on file for this visit.    Elayne Snare 12/24/2019, 11:02 AM

## 2019-12-25 ENCOUNTER — Encounter: Payer: Self-pay | Admitting: *Deleted

## 2019-12-25 ENCOUNTER — Other Ambulatory Visit: Payer: Self-pay | Admitting: Endocrinology

## 2019-12-28 NOTE — Patient Instructions (Addendum)
All other Health Maintenance issues reviewed.   All recommended immunizations and age-appropriate screenings are up-to-date or discussed.  No immunization administered today.    Medications reviewed and updated.  Changes include :   none   Please followup in 6  months    Health Maintenance, Male Adopting a healthy lifestyle and getting preventive care are important in promoting health and wellness. Ask your health care provider about:  The right schedule for you to have regular tests and exams.  Things you can do on your own to prevent diseases and keep yourself healthy. What should I know about diet, weight, and exercise? Eat a healthy diet   Eat a diet that includes plenty of vegetables, fruits, low-fat dairy products, and lean protein.  Do not eat a lot of foods that are high in solid fats, added sugars, or sodium. Maintain a healthy weight Body mass index (BMI) is a measurement that can be used to identify possible weight problems. It estimates body fat based on height and weight. Your health care provider can help determine your BMI and help you achieve or maintain a healthy weight. Get regular exercise Get regular exercise. This is one of the most important things you can do for your health. Most adults should:  Exercise for at least 150 minutes each week. The exercise should increase your heart rate and make you sweat (moderate-intensity exercise).  Do strengthening exercises at least twice a week. This is in addition to the moderate-intensity exercise.  Spend less time sitting. Even light physical activity can be beneficial. Watch cholesterol and blood lipids Have your blood tested for lipids and cholesterol at 73 years of age, then have this test every 5 years. You may need to have your cholesterol levels checked more often if:  Your lipid or cholesterol levels are high.  You are older than 73 years of age.  You are at high risk for heart disease. What should I  know about cancer screening? Many types of cancers can be detected early and may often be prevented. Depending on your health history and family history, you may need to have cancer screening at various ages. This may include screening for:  Colorectal cancer.  Prostate cancer.  Skin cancer.  Lung cancer. What should I know about heart disease, diabetes, and high blood pressure? Blood pressure and heart disease  High blood pressure causes heart disease and increases the risk of stroke. This is more likely to develop in people who have high blood pressure readings, are of African descent, or are overweight.  Talk with your health care provider about your target blood pressure readings.  Have your blood pressure checked: ? Every 3-5 years if you are 38-54 years of age. ? Every year if you are 74 years old or older.  If you are between the ages of 42 and 66 and are a current or former smoker, ask your health care provider if you should have a one-time screening for abdominal aortic aneurysm (AAA). Diabetes Have regular diabetes screenings. This checks your fasting blood sugar level. Have the screening done:  Once every three years after age 79 if you are at a normal weight and have a low risk for diabetes.  More often and at a younger age if you are overweight or have a high risk for diabetes. What should I know about preventing infection? Hepatitis B If you have a higher risk for hepatitis B, you should be screened for this virus. Talk with your health  care provider to find out if you are at risk for hepatitis B infection. Hepatitis C Blood testing is recommended for:  Everyone born from [birth year] through 1964.  Anyone with known risk factors for hepatitis C. Sexually transmitted infections (STIs)  You should be screened each year for STIs, including gonorrhea and chlamydia, if: ? You are sexually active and are younger than 73 years of age. ? You are older than 73 years of age and  your health care provider tells you that you are at risk for this type of infection. ? Your sexual activity has changed since you were last screened, and you are at increased risk for chlamydia or gonorrhea. Ask your health care provider if you are at risk.  Ask your health care provider about whether you are at high risk for HIV. Your health care provider may recommend a prescription medicine to help prevent HIV infection. If you choose to take medicine to prevent HIV, you should first get tested for HIV. You should then be tested every 3 months for as long as you are taking the medicine. Follow these instructions at home: Lifestyle  Do not use any products that contain nicotine or tobacco, such as cigarettes, e-cigarettes, and chewing tobacco. If you need help quitting, ask your health care provider.  Do not use street drugs.  Do not share needles.  Ask your health care provider for help if you need support or information about quitting drugs. Alcohol use  Do not drink alcohol if your health care provider tells you not to drink.  If you drink alcohol: ? Limit how much you have to 0-2 drinks a day. ? Be aware of how much alcohol is in your drink. In the [place] one drink equals one 12 oz bottle of beer (355 mL), one 5 oz glass of wine (148 mL), or one 1 oz glass of hard liquor (44 mL). General instructions  Schedule regular health, dental, and eye exams.  Stay current with your vaccines.  Tell your health care provider if: ? You often feel depressed. ? You have ever been abused or do not feel safe at home. Summary  Adopting a healthy lifestyle and getting preventive care are important in promoting health and wellness.  Follow your health care provider's instructions about healthy diet, exercising, and getting tested or screened for diseases.  Follow your health care provider's instructions on monitoring your cholesterol and blood pressure. This information is not intended to  replace advice given to you by your health care provider. Make sure you discuss any questions you have with your health care provider. Document Revised: 06/12/2018 Document Reviewed: 06/12/2018 Elsevier Patient Education  2020 [employer].

## 2019-12-28 NOTE — Progress Notes (Signed)
Subjective:    Patient ID: Noah Barnes, male    DOB: 1945/10/07, 74 y.o.   MRN: [id number]  HPI He is here for a physical exam.   He feels good and has no concerns.    Medications and allergies reviewed with patient and updated if appropriate.  Patient Active Problem List   Diagnosis Date Noted  . Left lower quadrant abdominal pain 11/28/2019  . Abnormal findings on diagnostic imaging of lung 04/04/2019  . History of DVT of lower extremity, b/l 04/03/2019  . History of pulmonary embolus (PE) 04/03/2019  . Hypomagnesemia 04/03/2019  . HCAP (healthcare-associated pneumonia) 03/24/2019  . Nephrolithiasis 03/06/2019  . Seasonal allergic rhinitis 05/22/2018  . Paronychia of great toe of left foot 05/22/2018  . MR (mitral regurgitation) 12/20/2017  . CKD (chronic kidney disease) stage 3, GFR 30-59 ml/min 08/14/2017  . Abdominal hernia without obstruction and without gangrene 01/10/2016  . Chronic diastolic heart failure ([place]) 02/25/2015  . Right renal mass 02/25/2015  . Other constipation 02/22/2015  . Chronic venous insufficiency 01/18/2015  . Superficial phlebitis 01/18/2015  . IBS (irritable bowel syndrome) 11/12/2013  . Family history of prostate cancer 12/26/2011  . Diabetes mellitus with renal complications ([place]) 81/85/6314  . Gout 12/31/2007  . HYPERLIPIDEMIA 08/14/2007  . ERECTILE DYSFUNCTION 05/09/2007  . Essential hypertension 11/28/2006    Current Outpatient Medications on File Prior to Visit  Medication Sig Dispense Refill  . Accu-Chek FastClix Lancets MISC Use Accu Chek fastclix lancets to check blood sugar twice daily. 100 each 2  . allopurinol (ZYLOPRIM) 300 MG tablet Take 1 tablet (300 mg total) by mouth daily. 90 tablet 1  . atorvastatin (LIPITOR) 20 MG tablet Take 1 tablet by mouth once daily 90 tablet 0  . Blood Glucose Monitoring Suppl (ACCU-CHEK GUIDE ME) w/Device KIT 1 each by Does not apply route in the morning and at bedtime. Use Accu Chek Guide me  device to check blood sugar twice daily. 1 kit 0  . FERREX 150 150 MG capsule Take 1 capsule by mouth once daily 90 capsule 0  . glimepiride (AMARYL) 4 MG tablet TAKE 1 TABLET BY MOUTH ONCE DAILY BEFORE SUPPER 90 tablet 0  . glucose blood (ACCU-CHEK GUIDE) test strip Use Accu Chek Guide test strips as instructed to check blood sugar twice daily. 100 each 2  . hydrALAZINE (APRESOLINE) 50 MG tablet Take 1 tablet by mouth twice daily 180 tablet 1  . LINZESS 290 MCG CAPS capsule TAKE 1 CAPSULE BY MOUTH ONCE DAILY BEFORE BREAKFAST 30 capsule 2  . sildenafil (VIAGRA) 100 MG tablet TAKE 1 TABLET BY MOUTH AS NEEDED FOR ERECTILE DYSFUNCTION 6 tablet 1  . sitaGLIPtin (JANUVIA) 50 MG tablet Take 1 tablet (50 mg total) by mouth daily. 90 tablet 1  . tamsulosin (FLOMAX) 0.4 MG CAPS capsule TAKE 1 CAPSULE BY MOUTH ONCE DAILY AFTER  SUPPER 90 capsule 1  . apixaban (ELIQUIS) 2.5 MG TABS tablet Take 1 tablet (2.5 mg total) by mouth 2 (two) times daily. 180 tablet 3   No current facility-administered medications on file prior to visit.    Past Medical History:  Diagnosis Date  . CHF (congestive heart failure) ([place])   . Chronic diastolic heart failure ([place])   . Constipation   . Diabetes mellitus   . Diverticulitis   . Diverticulosis   . ED (erectile dysfunction)   . Gout   . Heart murmur   . Hyperlipidemia   . Hypertension   .  IBS (irritable bowel syndrome)   . Renal calculus   . Renal insufficiency   . Tubular adenoma of colon     Past Surgical History:  Procedure Laterality Date  . COLONOSCOPY  1998   negative; no F/U  . EXTRACORPOREAL SHOCK WAVE LITHOTRIPSY Left 03/20/2019   Procedure: EXTRACORPOREAL SHOCK WAVE LITHOTRIPSY (ESWL);  Surgeon: Cleon Gustin, MD;  Location: [facility];  Service: Urology;  Laterality: Left;  . knee effusion tapped      Social History   Socioeconomic History  . Marital status: Married    Spouse name: Not on file  . Number of children: 1  . Years of  education: Not on file  . Highest education level: Not on file  Occupational History  . Occupation: Metallurgist: [employer]    Comment: [phone]  Tobacco Use  . Smoking status: Former Smoker    Packs/day: 0.30    Types: Cigarettes    Quit date: 07/04/1991    Years since quitting: 28.5  . Smokeless tobacco: Never Used  . Tobacco comment: smoked age [age], up to 1/3 ppd; may be less  Vaping Use  . Vaping Use: Never used  Substance and Sexual Activity  . Alcohol use: Yes    Alcohol/week: 2.0 standard drinks    Types: 2 Glasses of wine per week    Comment: rare  . Drug use: No  . Sexual activity: Yes  Other Topics Concern  . Not on file  Social History Narrative  . Not on file   Social Determinants of Health   Financial Resource Strain:   . Difficulty of Paying Living Expenses:   Food Insecurity:   . Worried About Charity fundraiser in the Last Year:   . Arboriculturist in the Last Year:   Transportation Needs:   . Film/video editor (Medical):   [name] Lack of Transportation (Non-Medical):   Physical Activity:   . Days of Exercise per Week:   . Minutes of Exercise per Session:   Stress:   . Feeling of Stress :   Social Connections:   . Frequency of Communication with Friends and Family:   . Frequency of Social Gatherings with Friends and Family:   . Attends Religious Services:   . Active Member of Clubs or Organizations:   . Attends Archivist Meetings:   [name] Marital Status:     Family History  Problem Relation Age of Onset  . Heart attack Mother 3  . Hypertension Mother   . Prostate cancer Father 14  . Diabetes Father   . Irritable bowel syndrome Father   . Colon cancer Brother        dx in his late 61's  . Irritable bowel syndrome Sister        x 2  . Irritable bowel syndrome Brother   . Stroke Neg Hx   . Esophageal cancer Neg Hx   . Rectal cancer Neg Hx   . Stomach cancer Neg Hx     Review of Systems    Constitutional: Negative for chills, fatigue and fever.  Eyes: Negative for visual disturbance.  Respiratory: Negative for cough, shortness of breath and wheezing.   Cardiovascular: Positive for leg swelling. Negative for chest pain and palpitations.  Gastrointestinal: Positive for constipation. Negative for abdominal pain, blood in stool, diarrhea and nausea.       No gerd  Genitourinary: Negative for difficulty urinating, dysuria  and hematuria.  Musculoskeletal: Positive for arthralgias (knees).  Skin: Negative for rash.  Neurological: Negative for light-headedness and headaches (rare).  Psychiatric/Behavioral: Negative for dysphoric mood. The patient is not nervous/anxious.        Objective:   Vitals:   12/29/19 1010  BP: 132/68  Pulse: (!) 58  Temp: 98.1 F (36.7 C)  SpO2: 96%   Filed Weights   12/29/19 1010  Weight: 223 lb (101.2 kg)   Body mass index is 33.91 kg/m.  BP Readings from Last 3 Encounters:  12/29/19 132/68  12/24/19 (!) 150/90  11/28/19 122/70    Wt Readings from Last 3 Encounters:  12/29/19 223 lb (101.2 kg)  12/24/19 223 lb 9.6 oz (101.4 kg)  11/28/19 222 lb 6.4 oz (100.9 kg)     Physical Exam Constitutional: He appears well-developed and well-nourished. No distress.  HENT:  Head: Normocephalic and atraumatic.  Right Ear: External ear normal.  Left Ear: External ear normal.  Mouth/Throat: Oropharynx is clear and moist.  Normal ear canals and TM b/l  Eyes: Conjunctivae and EOM are normal.  Neck: Neck supple. No tracheal deviation present. No thyromegaly present.  No carotid bruit  Cardiovascular: Normal rate, regular rhythm, normal heart sounds and intact distal pulses.   No murmur heard. Pulmonary/Chest: Effort normal and breath sounds normal. No respiratory distress. He has no wheezes. He has no rales.  Abdominal: Soft. He exhibits no distension. There is no tenderness.  Genitourinary: deferred  Musculoskeletal: He exhibits no edema.   Lymphadenopathy:   He has no cervical adenopathy.  Skin: Skin is warm and dry. He is not diaphoretic.  Psychiatric: He has a normal mood and affect. His behavior is normal.         Assessment & Plan:   Physical exam: Screening blood work  deferred Immunizations  Tdap, shingrix discussed Colonoscopy   Due - has appt with GI next month Eye exams   Up to date  Exercise   Walking, going to [facility]   Ok for age Substance abuse   none  See Problem List for Assessment and Plan of chronic medical problems.   This visit occurred during the SARS-CoV-2 public health emergency.  Safety protocols were in place, including screening questions prior to the visit, additional usage of staff PPE, and extensive cleaning of exam room while observing appropriate contact time as indicated for disinfecting solutions.

## 2019-12-29 ENCOUNTER — Encounter: Payer: Self-pay | Admitting: Internal Medicine

## 2019-12-29 ENCOUNTER — Other Ambulatory Visit: Payer: Self-pay

## 2019-12-29 ENCOUNTER — Ambulatory Visit (INDEPENDENT_AMBULATORY_CARE_PROVIDER_SITE_OTHER): Payer: Medicare PPO | Admitting: Internal Medicine

## 2019-12-29 VITALS — BP 132/68 | HR 58 | Temp 98.1°F | Ht 68.0 in | Wt 223.0 lb

## 2019-12-29 DIAGNOSIS — I10 Essential (primary) hypertension: Secondary | ICD-10-CM | POA: Diagnosis not present

## 2019-12-29 DIAGNOSIS — F528 Other sexual dysfunction not due to a substance or known physiological condition: Secondary | ICD-10-CM

## 2019-12-29 DIAGNOSIS — E782 Mixed hyperlipidemia: Secondary | ICD-10-CM | POA: Diagnosis not present

## 2019-12-29 DIAGNOSIS — N1832 Chronic kidney disease, stage 3b: Secondary | ICD-10-CM | POA: Diagnosis not present

## 2019-12-29 DIAGNOSIS — Z0000 Encounter for general adult medical examination without abnormal findings: Secondary | ICD-10-CM | POA: Diagnosis not present

## 2019-12-29 DIAGNOSIS — M109 Gout, unspecified: Secondary | ICD-10-CM | POA: Diagnosis not present

## 2019-12-29 DIAGNOSIS — E1129 Type 2 diabetes mellitus with other diabetic kidney complication: Secondary | ICD-10-CM

## 2019-12-29 NOTE — Assessment & Plan Note (Signed)
Chronic Sildenafil prn 

## 2019-12-29 NOTE — Assessment & Plan Note (Addendum)
Chronic BP well controlled Current regimen effective and well tolerated Continue current medications at current doses  

## 2019-12-29 NOTE — Assessment & Plan Note (Signed)
Chronic Lipids controlled Continue daily statin Regular exercise and healthy diet encouraged

## 2019-12-29 NOTE — Assessment & Plan Note (Signed)
Chronic No gout symptoms Continue allopurinol

## 2019-12-29 NOTE — Assessment & Plan Note (Signed)
Chronic  Management per Dr Kumar 

## 2019-12-29 NOTE — Assessment & Plan Note (Signed)
Chronic Following nephrology  Renal function improving

## 2019-12-31 ENCOUNTER — Other Ambulatory Visit: Payer: Self-pay | Admitting: Internal Medicine

## 2020-01-05 ENCOUNTER — Ambulatory Visit (HOSPITAL_COMMUNITY)
Admission: EM | Admit: 2020-01-05 | Discharge: 2020-01-05 | Disposition: A | Discharge status: Home / Self Care | Payer: Medicare PPO | Attending: Family Medicine | Admitting: Family Medicine

## 2020-01-05 DIAGNOSIS — M79671 Pain in right foot: Secondary | ICD-10-CM

## 2020-01-05 MED ORDER — PREDNISONE 20 MG PO TABS
20.0000 mg | ORAL_TABLET | Freq: Every day | ORAL | 0 refills | Status: AC
Start: 2020-01-05 — End: 2020-01-10

## 2020-01-05 MED ORDER — OXYCODONE-ACETAMINOPHEN 5-325 MG PO TABS: 1.0000 | ORAL_TABLET | Freq: Three times a day (TID) | ORAL | 0 refills | Status: DC | PRN

## 2020-01-05 NOTE — ED Triage Notes (Signed)
Pt c/o right foot pain since Friday and unable to ambulate. Denies injury/trauma to foot.  Pt specifies pain to top/dorsal aspect and medial ankle area of pain/swelling.  +3 edema to right lower leg/ankle; +1 edema to right leg. Pt states he has baseline edema to BLE, but RLE/foot edema increased.  Denies SOB, fever.

## 2020-01-05 NOTE — Discharge Instructions (Signed)
Please continue your Lasix as prescribed Begin prednisone 20 mg daily for the next 5 days-take with food Elevate leg and wear compression stockings May use oxycodone for severe pain  Please follow-up if any symptoms not improving or worsening, developing any redness, warmth or fevers to foot

## 2020-01-05 NOTE — ED Provider Notes (Signed)
[place]    CSN: [id number] Arrival date & time: 01/05/20  1238      History   Chief Complaint Chief Complaint  Patient presents with  . Foot Pain    HPI Noah Barnes is a 74 y.o. male history of CHF, DM type II, CKD stage III, on Eliquis, presenting today for evaluation of right foot pain.  Patient reports that over the past 4 days he has had pain in his right foot.  Initially began on the outer aspect of his foot and plantar surface, but it spread to also involve his medial ankle.  He denies any injury fall or trauma.  Denies any increase in activity or being on feet more than normal.  Denies history of prior foot issues.  He does report he has recurrent swelling and is on fluid pills for this.  He denies any recent worsening swelling.  Does not typically have pain associated with swelling.  Swelling is bilateral.  Denies any redness or change in color.  Reports last A1c approximately 6.4.  Reports a lot of pain with weightbearing HPI  Past Medical History:  Diagnosis Date  . CHF (congestive heart failure) ([place])   . Chronic diastolic heart failure ([place])   . Constipation   . Diabetes mellitus   . Diverticulitis   . Diverticulosis   . ED (erectile dysfunction)   . Gout   . Heart murmur   . Hyperlipidemia   . Hypertension   . IBS (irritable bowel syndrome)   . Renal calculus   . Renal insufficiency   . Tubular adenoma of colon     Patient Active Problem List   Diagnosis Date Noted  . Left lower quadrant abdominal pain 11/28/2019  . Abnormal findings on diagnostic imaging of lung 04/04/2019  . History of DVT of lower extremity, b/l 04/03/2019  . History of pulmonary embolus (PE) 04/03/2019  . Hypomagnesemia 04/03/2019  . HCAP (healthcare-associated pneumonia) 03/24/2019  . Nephrolithiasis 03/06/2019  . Seasonal allergic rhinitis 05/22/2018  . Paronychia of great toe of left foot 05/22/2018  . MR (mitral regurgitation) 12/20/2017  . CKD (chronic kidney  disease) stage 3, GFR 30-59 ml/min 08/14/2017  . Abdominal hernia without obstruction and without gangrene 01/10/2016  . Chronic diastolic heart failure ([place]) 02/25/2015  . Right renal mass 02/25/2015  . Other constipation 02/22/2015  . Chronic venous insufficiency 01/18/2015  . Superficial phlebitis 01/18/2015  . IBS (irritable bowel syndrome) 11/12/2013  . Family history of prostate cancer 12/26/2011  . Diabetes mellitus with renal complications ([place]) 48/54/6270  . Gout 12/31/2007  . HYPERLIPIDEMIA 08/14/2007  . ERECTILE DYSFUNCTION 05/09/2007  . Essential hypertension 11/28/2006    Past Surgical History:  Procedure Laterality Date  . COLONOSCOPY  1998   negative; no F/U  . EXTRACORPOREAL SHOCK WAVE LITHOTRIPSY Left 03/20/2019   Procedure: EXTRACORPOREAL SHOCK WAVE LITHOTRIPSY (ESWL);  Surgeon: Cleon Gustin, MD;  Location: [facility];  Service: Urology;  Laterality: Left;  . knee effusion tapped         Home Medications    Prior to Admission medications   Medication Sig Start Date End Date Taking? Authorizing Provider  allopurinol (ZYLOPRIM) 300 MG tablet Take 1 tablet (300 mg total) by mouth daily. 04/04/19  Yes Burns, Claudina Lick, MD  atorvastatin (LIPITOR) 20 MG tablet Take 1 tablet by mouth once daily 11/25/19  Yes Burns, Claudina Lick, MD  FERREX 150 150 MG capsule Take 1 capsule by mouth once daily 11/25/19  Yes  Binnie Rail, MD  glimepiride (AMARYL) 4 MG tablet TAKE 1 TABLET BY MOUTH ONCE DAILY BEFORE SUPPER 12/25/19  Yes Elayne Snare, MD  hydrALAZINE (APRESOLINE) 50 MG tablet Take 1 tablet by mouth twice daily 08/06/19  Yes Burns, Claudina Lick, MD  LINZESS 290 MCG CAPS capsule TAKE 1 CAPSULE BY MOUTH ONCE DAILY BEFORE BREAKFAST 10/17/19  Yes Pyrtle, Lajuan Lines, MD  Semaglutide (RYBELSUS) 3 MG TABS Take by mouth.   Yes [provider]  tamsulosin (FLOMAX) 0.4 MG CAPS capsule TAKE 1 CAPSULE BY MOUTH ONCE DAILY AFTER  SUPPER 07/18/19  Yes Burns, Claudina Lick, MD  Accu-Chek FastClix Lancets  MISC Use Accu Chek fastclix lancets to check blood sugar twice daily. 09/25/19   Elayne Snare, MD  apixaban (ELIQUIS) 2.5 MG TABS tablet Take 1 tablet (2.5 mg total) by mouth 2 (two) times daily. 09/23/19 12/22/19  Tish Men, MD  Blood Glucose Monitoring Suppl (ACCU-CHEK GUIDE ME) w/Device KIT 1 each by Does not apply route in the morning and at bedtime. Use Accu Chek Guide me device to check blood sugar twice daily. 09/25/19   Elayne Snare, MD  glucose blood (ACCU-CHEK GUIDE) test strip Use Accu Chek Guide test strips as instructed to check blood sugar twice daily. 09/25/19   Elayne Snare, MD  oxyCODONE-acetaminophen (PERCOCET/ROXICET) 5-325 MG tablet Take 1-2 tablets by mouth every 8 (eight) hours as needed for severe pain. 01/05/20   Jaritza Duignan C, PA-C  predniSONE (DELTASONE) 20 MG tablet Take 1 tablet (20 mg total) by mouth daily for 5 days. 01/05/20 01/10/20  Laycee Fitzsimmons C, PA-C  sildenafil (VIAGRA) 100 MG tablet TAKE 1 TABLET BY MOUTH AS NEEDED FOR ERECTILE DYSFUNCTION 12/31/19   Binnie Rail, MD  sitaGLIPtin (JANUVIA) 50 MG tablet Take 1 tablet (50 mg total) by mouth daily. 07/17/19   Elayne Snare, MD    Family History Family History  Problem Relation Age of Onset  . Heart attack Mother 1  . Hypertension Mother   . Prostate cancer Father 43  . Diabetes Father   . Irritable bowel syndrome Father   . Colon cancer Brother        dx in his late 77's  . Irritable bowel syndrome Sister        x 2  . Irritable bowel syndrome Brother   . Stroke Neg Hx   . Esophageal cancer Neg Hx   . Rectal cancer Neg Hx   . Stomach cancer Neg Hx     Social History Social History   Tobacco Use  . Smoking status: Former Smoker    Packs/day: 0.30    Types: Cigarettes    Quit date: 07/04/1991    Years since quitting: 28.5  . Smokeless tobacco: Never Used  . Tobacco comment: smoked age [age], up to 1/3 ppd; may be less  Vaping Use  . Vaping Use: Never used  Substance Use Topics  . Alcohol use: Yes     Alcohol/week: 2.0 standard drinks    Types: 2 Glasses of wine per week    Comment: rare  . Drug use: No     Allergies   Ace inhibitors and Angiotensin receptor blockers   Review of Systems Review of Systems  Constitutional: Negative for fatigue and fever.  Eyes: Negative for redness, itching and visual disturbance.  Respiratory: Negative for shortness of breath.   Cardiovascular: Negative for chest pain and leg swelling.  Gastrointestinal: Negative for nausea and vomiting.  Musculoskeletal: Positive for arthralgias, gait problem  and joint swelling. Negative for myalgias.  Skin: Negative for color change, rash and wound.  Neurological: Negative for dizziness, syncope, weakness, light-headedness and headaches.     Physical Exam Triage Vital Signs ED Triage Vitals  Enc Vitals Group     BP 01/05/20 1313 (!) 154/79     Pulse Rate 01/05/20 1313 68     Resp 01/05/20 1313 18     Temp 01/05/20 1313 99.1 F (37.3 C)     Temp Source 01/05/20 1313 Oral     SpO2 01/05/20 1313 97 %     Weight --      Height --      Head Circumference --      Peak Flow --      Pain Score 01/05/20 1310 9     Pain Loc --      Pain Edu? --      Excl. in [place]? --    No data found.  Updated Vital Signs BP (!) 154/79 (BP Location: Left Arm)   Pulse 68   Temp 99.1 F (37.3 C) (Oral)   Resp 18   SpO2 97%   Visual Acuity Right Eye Distance:   Left Eye Distance:   Bilateral Distance:    Right Eye Near:   Left Eye Near:    Bilateral Near:     Physical Exam Vitals and nursing note reviewed.  Constitutional:      Appearance: He is well-developed.     Comments: No acute distress  HENT:     Head: Normocephalic and atraumatic.     Nose: Nose normal.  Eyes:     Conjunctiva/sclera: Conjunctivae normal.  Cardiovascular:     Rate and Rhythm: Normal rate.  Pulmonary:     Effort: Pulmonary effort is normal. No respiratory distress.  Abdominal:     General: There is no distension.   Musculoskeletal:        General: Normal range of motion.     Cervical back: Neck supple.     Comments: Right foot: Mild 1+ edema noted to dorsum of foot and distal lower leg, no overlying erythema or warmth, tender to palpation of medial malleolus extending anteriorly into dorsum and lateral aspect of foot, tender diffusely throughout plantar tissue, most prominent near balls of feet Dorsalis pedis palpable  Skin:    General: Skin is warm and dry.  Neurological:     Mental Status: He is alert and oriented to person, place, and time.      UC Treatments / Results  Labs (all labs ordered are listed, but only abnormal results are displayed) Labs Reviewed - No data to display  EKG   Radiology No results found.  Procedures Procedures (including critical care time)  Medications Ordered in UC Medications - No data to display  Initial Impression / Assessment and Plan / UC Course  I have reviewed the triage vital signs and the nursing notes.  Pertinent labs & imaging results that were available during my care of the patient were reviewed by me and considered in my medical decision making (see chart for details).     No mechanism of injury, do not suspect acute fracture, most suspicious of in relation to swelling versus underlying arthritis.  Exam not consistent with gout or cellulitis at this time.  Opting for 5-day course of prednisone 20 mg, elevation May continue fluid pills for swelling, oxycodone for severe pain.  Elevation and compression stockings.  Discussed strict return precautions. Patient verbalized understanding and  is agreeable with plan.  Final Clinical Impressions(s) / UC Diagnoses   Final diagnoses:  Foot pain, right     Discharge Instructions     Please continue your Lasix as prescribed Begin prednisone 20 mg daily for the next 5 days-take with food Elevate leg and wear compression stockings May use oxycodone for severe pain  Please follow-up if any  symptoms not improving or worsening, developing any redness, warmth or fevers to foot   ED Prescriptions    Medication Sig Dispense Auth. Provider   predniSONE (DELTASONE) 20 MG tablet Take 1 tablet (20 mg total) by mouth daily for 5 days. 5 tablet Jackelynn Hosie C, PA-C   oxyCODONE-acetaminophen (PERCOCET/ROXICET) 5-325 MG tablet Take 1-2 tablets by mouth every 8 (eight) hours as needed for severe pain. 10 tablet Khyron Garno, Powell C, PA-C     I have reviewed the PDMP during this encounter.   Janith Lima, PA-C 01/05/20 1410

## 2020-01-06 ENCOUNTER — Telehealth: Payer: Self-pay

## 2020-01-06 NOTE — Telephone Encounter (Signed)
Per [facility] on 01/05/2020 11:59:02 AM, Caller states he has swelling and extreme pain in his right ankle that started on friday. He states he can not walk on it at all. States he was recently started on ribelsus for diabetes started on friday. Pain is 9/10. Denies any injury.  Advised to see PCP with 4 hours.  Patient was seen at [facility] at [facility]

## 2020-01-07 ENCOUNTER — Ambulatory Visit: Payer: Medicare PPO | Admitting: Internal Medicine

## 2020-01-07 ENCOUNTER — Encounter: Payer: Self-pay | Admitting: Internal Medicine

## 2020-01-07 ENCOUNTER — Telehealth: Payer: Self-pay | Admitting: *Deleted

## 2020-01-07 VITALS — BP 112/60 | HR 64 | Ht 66.5 in | Wt 221.5 lb

## 2020-01-07 DIAGNOSIS — K5909 Other constipation: Secondary | ICD-10-CM

## 2020-01-07 DIAGNOSIS — Z86010 Personal history of colonic polyps: Secondary | ICD-10-CM

## 2020-01-07 DIAGNOSIS — Z8719 Personal history of other diseases of the digestive system: Secondary | ICD-10-CM

## 2020-01-07 MED ORDER — LINACLOTIDE 290 MCG PO CAPS: ORAL_CAPSULE | ORAL | 2 refills | Status: DC

## 2020-01-07 MED ORDER — SUTAB 1479-225-188 MG PO TABS: 1.0000 | ORAL_TABLET | ORAL | 0 refills | Status: DC

## 2020-01-07 NOTE — Progress Notes (Signed)
Subjective:    Patient ID: Noah Barnes, male    DOB: 06-27-1946, 74 y.o.   MRN: [id number]  HPI Noah Barnes is a 74 year old male with a history of chronic constipation and IBS, adenomatous colon polyps, recent diverticulitis who is here for follow-up.  He is here alone today and was last seen in September 2020.  He reports he is currently doing well though he developed left lower quadrant abdominal pain in May.  CT scan was performed showing sigmoid diverticulitis.  He was treated with Augmentin with complete resolution of his abdominal pain.  This was his first episode of diverticulitis.  He reports symptoms have completely abated.  His chronic constipation has been currently well controlled with Linzess 290 mcg daily.  No upper GI or hepatobiliary complaint.  No blood in stool or melena.  He is feeling well.  He has continued Eliquis   Review of Systems As per HPI, otherwise negative  Current Medications, Allergies, Past Medical History, Past Surgical History, Family History and Social History were reviewed in [employer] record.      Objective:   Physical Exam BP 112/60 (BP Location: Left Arm, Patient Position: Sitting, Cuff Size: Normal)    Pulse 64    Ht 5' 6.5" (1.689 m) Comment: height measured without shoes   Wt 221 lb 8 oz (100.5 kg)    BMI 35.22 kg/m  Gen: awake, alert, NAD HEENT: anicteric CV: RRR, no mrg Pulm: CTA b/l Abd: soft, obese, NT/ND, +BS throughout Ext: no c/c/e Neuro: nonfocal  CT ABDOMEN AND PELVIS WITH CONTRAST   TECHNIQUE: Multidetector CT imaging of the abdomen and pelvis was performed using the standard protocol following bolus administration of intravenous contrast.   CONTRAST:  48mL OMNIPAQUE IOHEXOL 300 MG/ML  SOLN   COMPARISON:  March 24, 2019.   FINDINGS: Lower chest: Minimal right lower lobe subsegmental atelectasis is noted. 4 mm nodule is noted in right middle lobe.   Hepatobiliary: No focal liver  abnormality is seen. No gallstones, gallbladder wall thickening, or biliary dilatation.   Pancreas: Unremarkable. No pancreatic ductal dilatation or surrounding inflammatory changes.   Spleen: Normal in size without focal abnormality.   Adrenals/Urinary Tract: Adrenal glands appear normal. Right renal cyst is noted. Bilateral nephrolithiasis is noted. No hydronephrosis or renal obstruction is noted. Urinary bladder is unremarkable.   Stomach/Bowel: The stomach appears normal. The appendix appears normal. Focal diverticulitis is seen involving the proximal sigmoid colon without evidence of abscess.   Vascular/Lymphatic: Aortic atherosclerosis. No enlarged abdominal or pelvic lymph nodes.   Reproductive: Prostate is unremarkable.   Other: No abdominal wall hernia or abnormality. No abdominopelvic ascites.   Musculoskeletal: No acute or significant osseous findings.   IMPRESSION: 1. Focal diverticulitis is seen involving the proximal sigmoid colon without evidence of abscess. 2. Bilateral nephrolithiasis. No hydronephrosis or renal obstruction is noted. 3. 4 mm nodule is noted in right middle lobe. No follow-up needed if patient is low-risk. Non-contrast chest CT can be considered in 12 months if patient is high-risk. This recommendation follows the consensus statement: Guidelines for Management of Incidental Pulmonary Nodules Detected on CT Images: From the Fleischner Society 2017; Radiology 2017; 284:228-243.   Aortic Atherosclerosis (ICD10-I70.0).     Electronically Signed   By: Marijo Conception M.D.   On: 11/28/2019 16:05          Assessment & Plan:  74 year old male with a history of chronic constipation and IBS, adenomatous colon polyps, recent diverticulitis  who is here for follow-up.  1.  Resolved diverticulitis --sigmoid diverticulitis successfully treated without Augmentin and not complicated.  He is due for surveillance colonoscopy, see #3  2.  Chronic  constipation with IBS --doing well on Linzess 290 mcg daily.  Continue current dose.  Okay for him to periodically use a colonoscopy bowel preparation to perform a bowel purge which seems to restart more regular bowel movements for him.  3.  History of adenomatous colon polyps --due for surveillance colonoscopy at this time.  Also reasonable to repeat given recent diverticulitis.  We discussed the risk, benefits and alternatives and he is agreeable and wishes to proceed.  We will need to hold Eliquis as below  Will hold Eliquis 2 days prior to endoscopic procedures - will instruct when and how to resume after procedure. Benefits and risks of procedure explained including risks of bleeding, perforation, infection, missed lesions, reactions to medications and possible need for hospitalization and surgery for complications. Additional rare but real risk of stroke or other vascular clotting events off Eliquis also explained and need to seek urgent help if any signs of these problems occur. Will communicate by phone or EMR with patient's  prescribing provider to confirm that holding Eliquis is reasonable in this case.

## 2020-01-07 NOTE — Telephone Encounter (Signed)
   CORIE VAVRA October 06, 1945 [id number]  Dear Dr Maylon Peppers:  We have scheduled the above named patient for a colonoscopy procedure. Our records show that (s)he is on anticoagulation therapy.  Please advise as to whether the patient may come off their therapy of Eliquis 2 days prior to their procedure which is scheduled for 02/23/20.  Please route your response to Dixon Boos, CMA.  Sincerely,    [facility]

## 2020-01-07 NOTE — Patient Instructions (Signed)
You have been scheduled for a colonoscopy. Please follow written instructions given to you at your visit today.  Please pick up your prep supplies at the pharmacy within the next 1-3 days. If you use inhalers (even only as needed), please bring them with you on the day of your procedure. Your physician has requested that you go to www.startemmi.com and enter the access code given to you at your visit today. This web site gives a general overview about your procedure. However, you should still follow specific instructions given to you by our office regarding your preparation for the procedure.  Continue Linzess 290 mcg daily.  If you are age 13 or older, your body mass index should be between 23-30. Your Body mass index is 35.22 kg/m. If this is out of the aforementioned range listed, please consider follow up with your Primary Care Provider.  If you are age 80 or younger, your body mass index should be between 19-25. Your Body mass index is 35.22 kg/m. If this is out of the aformentioned range listed, please consider follow up with your Primary Care Provider.   Due to recent changes in healthcare laws, you may see the results of your imaging and laboratory studies on MyChart before your provider has had a chance to review them.  We understand that in some cases there may be results that are confusing or concerning to you. Not all laboratory results come back in the same time frame and the provider may be waiting for multiple results in order to interpret others.  Please give [place] 48 hours in order for your provider to thoroughly review all the results before contacting the office for clarification of your results.

## 2020-01-08 ENCOUNTER — Other Ambulatory Visit: Payer: Self-pay | Admitting: Internal Medicine

## 2020-01-15 NOTE — Telephone Encounter (Signed)
I have contacted [facility] in [place] [phone]) who indicates that Dr Maylon Peppers is no longer practicing at the cancer center. However, Dr Marin Olp will be taking over Mr.Stauder' care. I will forward our note to Dr Marin Olp.

## 2020-01-19 ENCOUNTER — Telehealth: Payer: Self-pay

## 2020-01-19 NOTE — Telephone Encounter (Signed)
Dottie from [facility] called and asked for Der.Ennever to review the note from Dr.Pyrtle from 01/07/20. Printed for Dr.Ennever for review.  Next appt is scheduled for 03/25/2020

## 2020-01-20 ENCOUNTER — Other Ambulatory Visit: Payer: Self-pay | Admitting: *Deleted

## 2020-01-20 ENCOUNTER — Telehealth: Payer: Self-pay | Admitting: Endocrinology

## 2020-01-20 MED ORDER — RYBELSUS 3 MG PO TABS: 3.0000 mg | ORAL_TABLET | Freq: Every day | ORAL | 0 refills | Status: DC

## 2020-01-20 NOTE — Telephone Encounter (Signed)
Noah Barnes called back with his blood sugars dating back to the 01/13/20 .  7/19 after evening meal CBG 119  7/19 fasting                    CBG 110  7/18 after evening meal CBG 142 7/18 fasting                    CBG 119  7/17  7/17 fasting                    CBG 118  7/16 7/16 fasting                    CBG 114  7/15  7/15 fasting                    CBG 135  7/14 after evening meal CBG 122 7/14 fasting                    CBG 121  7/13 after evening meal CBG 189 7/13 fasting

## 2020-01-20 NOTE — Telephone Encounter (Signed)
Refill sent to local pharmacy for Rybelsus 3 mg. Spoke with patient regarding refill . Patient verbalized understanding and did not have any questions.

## 2020-01-20 NOTE — Telephone Encounter (Signed)
Patient called stating Dr Dwyane Dee gave him a sample of Rybelsus and he is calling to request an Rx for 30 days call into:  [place], [place]. Phone:  [phone]  Fax:  [phone]

## 2020-01-20 NOTE — Telephone Encounter (Signed)
Need to know what his blood sugars are for the last 3 to 4 days

## 2020-01-20 NOTE — Telephone Encounter (Signed)
Would you like to send this patient an RX for Rybelsus, if so please specify dose. Thank you.

## 2020-01-20 NOTE — Telephone Encounter (Signed)
We will keep him on 3 mg Rybelsus every morning, sent prescription for 30 days with 3 refills

## 2020-01-22 NOTE — Telephone Encounter (Signed)
Dr Marin Olp, As patient's new hematologist, please advise regarding anticoagulation hold for upcoming endoscopic procedure.Marland KitchenMarland KitchenMarland Kitchen

## 2020-01-27 NOTE — Telephone Encounter (Signed)
I have spoken to Lorriane Shire at [facility] to inquire about getting Dr Marin Olp to look over our request for anticoag clearance. She asks that we fax our request to fax number [phone]. Faxed request.

## 2020-01-28 ENCOUNTER — Other Ambulatory Visit: Payer: Self-pay | Admitting: Internal Medicine

## 2020-02-05 ENCOUNTER — Other Ambulatory Visit: Payer: Self-pay | Admitting: Internal Medicine

## 2020-02-05 NOTE — Telephone Encounter (Signed)
I have spoken to patient to advise that we have gotten a faxed response from Dr Marin Olp indicating:   "please hold Eliquis for two days before colonoscopy and have patient restart day after as long as patient is not bleeding after procedure. -Dr. Burney Gauze"  Patient verbalizes understanding of this information and has repeated it back to me.   Please see original correspondence under "media" tab in Epic.

## 2020-02-05 NOTE — Telephone Encounter (Signed)
I have left a voicemail for Dr Antonieta Pert nurse asking that she have Dr Marin Olp to respond to our request for patient to hold Eliquis 2 days prior to his upcoming endoscopic procedure with [place] on 02/23/20. Otherwise, we will have to cancel this procedure which would impact his care.

## 2020-02-16 DIAGNOSIS — N1832 Chronic kidney disease, stage 3b: Secondary | ICD-10-CM | POA: Diagnosis not present

## 2020-02-19 ENCOUNTER — Encounter: Payer: Self-pay | Admitting: Internal Medicine

## 2020-02-23 ENCOUNTER — Encounter: Payer: Self-pay | Admitting: Internal Medicine

## 2020-02-23 ENCOUNTER — Other Ambulatory Visit: Payer: Self-pay

## 2020-02-23 ENCOUNTER — Other Ambulatory Visit: Payer: Medicare PPO

## 2020-02-23 ENCOUNTER — Ambulatory Visit (AMBULATORY_SURGERY_CENTER): Payer: Medicare PPO | Admitting: Internal Medicine

## 2020-02-23 VITALS — BP 127/75 | HR 68 | Temp 97.5°F | Resp 16 | Ht 66.0 in | Wt 221.0 lb

## 2020-02-23 DIAGNOSIS — Z86010 Personal history of colonic polyps: Secondary | ICD-10-CM

## 2020-02-23 DIAGNOSIS — Z800 Family history of malignant neoplasm of digestive organs: Secondary | ICD-10-CM | POA: Diagnosis not present

## 2020-02-23 MED ORDER — SODIUM CHLORIDE 0.9 % IV SOLN: 500.0000 mL | Freq: Once | INTRAVENOUS | Status: DC

## 2020-02-23 NOTE — Patient Instructions (Signed)
Handouts provided on diverticulosis and hemorrhoids.   Resume Eliquis (apixaban) at prior dose today.  YOU HAD AN ENDOSCOPIC PROCEDURE TODAY AT [facility]:   Refer to the procedure report that was given to you for any specific questions about what was found during the examination.  If the procedure report does not answer your questions, please call your gastroenterologist to clarify.  If you requested that your care partner not be given the details of your procedure findings, then the procedure report has been included in a sealed envelope for you to review at your convenience later.  YOU SHOULD EXPECT: Some feelings of bloating in the abdomen. Passage of more gas than usual.  Walking can help get rid of the air that was put into your GI tract during the procedure and reduce the bloating. If you had a lower endoscopy (such as a colonoscopy or flexible sigmoidoscopy) you may notice spotting of blood in your stool or on the toilet paper. If you underwent a bowel prep for your procedure, you may not have a normal bowel movement for a few days.  Please Note:  You might notice some irritation and congestion in your nose or some drainage.  This is from the oxygen used during your procedure.  There is no need for concern and it should clear up in a day or so.  SYMPTOMS TO REPORT IMMEDIATELY:   Following lower endoscopy (colonoscopy or flexible sigmoidoscopy):  Excessive amounts of blood in the stool  Significant tenderness or worsening of abdominal pains  Swelling of the abdomen that is new, acute  Fever of 100F or higher  For urgent or emergent issues, a gastroenterologist can be reached at any hour by calling [phone]. Do not use MyChart messaging for urgent concerns.    DIET:  We do recommend a small meal at first, but then you may proceed to your regular diet.  Drink plenty of fluids but you should avoid alcoholic beverages for 24 hours.  ACTIVITY:  You should plan to  take it easy for the rest of today and you should NOT DRIVE or use heavy machinery until tomorrow (because of the sedation medicines used during the test).    FOLLOW UP: Our staff will call the number listed on your records 48-72 hours following your procedure to check on you and address any questions or concerns that you may have regarding the information given to you following your procedure. If we do not reach you, we will leave a message.  We will attempt to reach you two times.  During this call, we will ask if you have developed any symptoms of COVID 19. If you develop any symptoms (ie: fever, flu-like symptoms, shortness of breath, cough etc.) before then, please call [phone].  If you test positive for Covid 19 in the 2 weeks post procedure, please call and report this information to [place].    If any biopsies were taken you will be contacted by phone or by letter within the next 1-3 weeks.  Please call us at [phone] if you have not heard about the biopsies in 3 weeks.    SIGNATURES/CONFIDENTIALITY: You and/or your care partner have signed paperwork which will be entered into your electronic medical record.  These signatures attest to the fact that that the information above on your After Visit Summary has been reviewed and is understood.  Full responsibility of the confidentiality of this discharge information lies with you and/or your care-partner.

## 2020-02-23 NOTE — Progress Notes (Signed)
pt tolerated well. VSS. awake and to recovery. Report given to RN.  

## 2020-02-23 NOTE — Progress Notes (Signed)
VS by CW. ?

## 2020-02-23 NOTE — Op Note (Signed)
[facility] Patient Name: Noah Barnes Procedure Date: 02/23/2020 8:54 AM MRN: [id number] Endoscopist: Jerene Bears , MD Age: 74 Referring MD:  Date of Birth: [birth year] Gender: Male Account #: [id number] Procedure:                Colonoscopy Indications:              High risk colon cancer surveillance: Personal                            history of non-advanced adenoma, Family history of                            colon cancer in a first-degree relative (brother)                            before age 33 years, Last colonoscopy: 2015 Medicines:                Monitored Anesthesia Care Procedure:                Pre-Anesthesia Assessment:                           - Prior to the procedure, a History and Physical                            was performed, and patient medications and                            allergies were reviewed. The patient's tolerance of                            previous anesthesia was also reviewed. The risks                            and benefits of the procedure and the sedation                            options and risks were discussed with the patient.                            All questions were answered, and informed consent                            was obtained. Prior Anticoagulants: The patient has                            taken Eliquis (apixaban), last dose was 2 days                            prior to procedure. ASA Grade Assessment: III - A                            patient with severe systemic disease. After  reviewing the risks and benefits, the patient was                            deemed in satisfactory condition to undergo the                            procedure.                           After obtaining informed consent, the colonoscope                            was passed under direct vision. Throughout the                            procedure, the patient's blood pressure, pulse, and                             oxygen saturations were monitored continuously. The                            Colonoscope was introduced through the anus and                            advanced to the cecum, identified by appendiceal                            orifice and ileocecal valve. The colonoscopy was                            performed without difficulty. The patient tolerated                            the procedure well. The quality of the bowel                            preparation was good. The ileocecal valve,                            appendiceal orifice, and rectum were photographed. Scope In: 9:14:59 AM Scope Out: 9:28:22 AM Scope Withdrawal Time: 0 hours 11 minutes 14 seconds  Total Procedure Duration: 0 hours 13 minutes 23 seconds  Findings:                 The digital rectal exam was normal.                           Multiple small and large-mouthed diverticula were                            found in the sigmoid colon, descending colon and                            ascending colon.  Internal hemorrhoids were found during                            retroflexion. The hemorrhoids were medium-sized.                           The exam was otherwise without abnormality. Complications:            No immediate complications. Estimated Blood Loss:     Estimated blood loss: none. Impression:               - Moderate diverticulosis in the sigmoid colon, in                            the descending colon and in the ascending colon.                           - Internal hemorrhoids.                           - The examination was otherwise normal.                           - No specimens collected. Recommendation:           - Patient has a contact number available for                            emergencies. The signs and symptoms of potential                            delayed complications were discussed with the                            patient. Return to normal  activities tomorrow.                            Written discharge instructions were provided to the                            patient.                           - Resume previous diet.                           - Continue present medications.                           - Resume Eliquis (apixaban) at prior dose today.                            Refer to managing physician for further adjustment                            of therapy.                           -  Repeat colonoscopy in 5 years based on family                            history for surveillance. Jerene Bears, MD 02/23/2020 9:35:11 AM This report has been signed electronically.

## 2020-02-24 ENCOUNTER — Other Ambulatory Visit (INDEPENDENT_AMBULATORY_CARE_PROVIDER_SITE_OTHER): Payer: Medicare PPO

## 2020-02-24 DIAGNOSIS — E1129 Type 2 diabetes mellitus with other diabetic kidney complication: Secondary | ICD-10-CM | POA: Diagnosis not present

## 2020-02-24 LAB — COMPREHENSIVE METABOLIC PANEL
ALT: 45 U/L (ref 0–53)
AST: 27 U/L (ref 0–37)
Albumin: 3.9 g/dL (ref 3.5–5.2)
Alkaline Phosphatase: 69 U/L (ref 39–117)
BUN: 36 mg/dL — ABNORMAL HIGH (ref 6–23)
CO2: 22 mEq/L (ref 19–32)
Calcium: 9.5 mg/dL (ref 8.4–10.5)
Chloride: 109 mEq/L (ref 96–112)
Creatinine, Ser: 1.76 mg/dL — ABNORMAL HIGH (ref 0.40–1.50)
GFR: 46.14 mL/min — ABNORMAL LOW (ref >60.00)
Glucose, Bld: 124 mg/dL — ABNORMAL HIGH (ref 70–99)
Potassium: 4.3 mEq/L (ref 3.5–5.1)
Sodium: 138 mEq/L (ref 135–145)
Total Bilirubin: 0.6 mg/dL (ref 0.2–1.2)
Total Protein: 6.7 g/dL (ref 6.0–8.3)

## 2020-02-25 ENCOUNTER — Telehealth: Payer: Self-pay

## 2020-02-25 ENCOUNTER — Other Ambulatory Visit: Payer: Self-pay | Admitting: Internal Medicine

## 2020-02-25 LAB — FRUCTOSAMINE: Fructosamine: 241 umol/L (ref 0–285)

## 2020-02-25 NOTE — Telephone Encounter (Signed)
  Follow up Call-  Call back number 02/23/2020  Post procedure Call Back phone  # [phone]  Permission to leave phone message Yes  Some recent data might be hidden     Patient questions:  Do you have a fever, pain , or abdominal swelling? No. Pain Score  0 *  Have you tolerated food without any problems? Yes.    Have you been able to return to your normal activities? Yes.    Do you have any questions about your discharge instructions: Diet   No. Medications  No. Follow up visit  No.  Do you have questions or concerns about your Care? No.  Actions: * If pain score is 4 or above: No action needed, pain <4. 1. Have you developed a fever since your procedure? no  2.   Have you had an respiratory symptoms (SOB or cough) since your procedure? no  3.   Have you tested positive for COVID 19 since your procedure no  4.   Have you had any family members/close contacts diagnosed with the COVID 19 since your procedure?  no   If yes to any of these questions please route to Joylene John, RN and Joella Prince, RN

## 2020-02-26 ENCOUNTER — Ambulatory Visit: Payer: Medicare PPO | Admitting: Endocrinology

## 2020-02-26 ENCOUNTER — Other Ambulatory Visit: Payer: Self-pay

## 2020-02-26 ENCOUNTER — Encounter: Payer: Self-pay | Admitting: Endocrinology

## 2020-02-26 VITALS — BP 120/70 | HR 58 | Ht 66.0 in | Wt 224.4 lb

## 2020-02-26 DIAGNOSIS — E119 Type 2 diabetes mellitus without complications: Secondary | ICD-10-CM

## 2020-02-26 DIAGNOSIS — E782 Mixed hyperlipidemia: Secondary | ICD-10-CM

## 2020-02-26 NOTE — Progress Notes (Signed)
Patient ID: Noah Barnes, male   DOB: August 02, 1945, 74 y.o.   MRN: [id number]    Reason for Appointment : Follow-up visit  History of Present Illness          Diagnosis: Type 2 diabetes mellitus, date of diagnosis: 2008       Past history: His diabetes was borderline in the beginning and not clear what medications he was started on. Has been taking metformin for several years and this was later changed to [place]. Amaryl probably started in 5/14 when blood sugars were higher, initially was given 1 mg and now has been taking 2 mg. He had a relatively high A1c of 7.1, difficulty losing weight and a glucose of 202 on his initial consultation in 06/2013 He was then tried on Invokana in addition to his Janumet and Amaryl Invokana was reduced to 100 mg in early 2015 been creatinine had gone up to 1.6  Recent history:   Oral hypoglycemic drugs the patient is taking are: Amaryl 4 mg at supper, Rybelsus 3 mg daily   His A1c is last 6.4, previous range 5.8-6.6 Fructosamine is 241  Current management, blood sugar patterns and problems identified:     He has been started on RYBELSUS because of his progressive weight gain to replace Januvia  Currently with 3 mg Rybelsus blood sugars are well controlled but his weight has not come down  He is tolerating this well  Blood sugars are more stable compared to his last visit especially in the morning and overall better also  He had previously been exercising at the [facility] 3 to 4 days a week but because of recent pandemic worsening he has not gone and has not done much of any other exercise  Usually tries to watch his diet  Has only minimal postprandial hyperglycemia at night but checking after about 10 PM No nausea with Rybelsus  Side effects from medications have been:  renal dysfunction from Invokana  Glucose monitoring:  done <1 times a day         Glucometer:  Accu-Chek   Blood Glucose readings and averages from Accu-Chek  download   PRE-MEAL Fasting Lunch Dinner Bedtime Overall  Glucose range:  86-149   121  90-272 84-272  Mean/median:  117    135    Previously:   PRE-MEAL Fasting Lunch Dinner Bedtime Overall  Glucose range:  90-181    110-203   Mean/median:  138    146  143      Glycemic control:   Lab Results  Component Value Date   HGBA1C 6.4 12/22/2019   HGBA1C 6.6 (H) 08/18/2019   HGBA1C 6.4 05/15/2019   Lab Results  Component Value Date   MICROALBUR 3.5 (H) 05/15/2019   LDLCALC 61 05/15/2019   CREATININE 1.76 (H) 02/24/2020    Self-care: Usually has low-fat diet, eggs in am with some carbohydrate, frequently no lunch     Dietician consultations: 08/2013              Weight history:   Wt Readings from Last 3 Encounters:  02/26/20 224 lb 6.4 oz (101.8 kg)  02/23/20 221 lb (100.2 kg)  01/07/20 221 lb 8 oz (100.5 kg)       Allergies as of 02/26/2020      Reactions   Ace Inhibitors Other (See Comments)   Severe AKI due to ARB + dehydration Aug 2016   Angiotensin Receptor Blockers Other (See Comments)   Severe AKI due  to ARB + dehydration Aug 2016      Medication List       Accurate as of February 26, 2020 11:59 PM. If you have any questions, ask your nurse or doctor.        Accu-Chek FastClix Lancets Misc Use Accu Chek fastclix lancets to check blood sugar twice daily.   Accu-Chek Guide Me w/Device Kit 1 each by Does not apply route in the morning and at bedtime. Use Accu Chek Guide me device to check blood sugar twice daily.   Accu-Chek Guide test strip Generic drug: glucose blood Use Accu Chek Guide test strips as instructed to check blood sugar twice daily.   allopurinol 300 MG tablet Commonly known as: ZYLOPRIM Take 1 tablet by mouth once daily   apixaban 2.5 MG Tabs tablet Commonly known as: Eliquis Take 1 tablet (2.5 mg total) by mouth 2 (two) times daily.   atorvastatin 20 MG tablet Commonly known as: LIPITOR Take 1 tablet by mouth once daily    Ferrex 150 150 MG capsule Generic drug: iron polysaccharides Take 1 capsule by mouth once daily   furosemide 40 MG tablet Commonly known as: LASIX Take 40 mg by mouth daily.   glimepiride 4 MG tablet Commonly known as: AMARYL TAKE 1 TABLET BY MOUTH ONCE DAILY BEFORE SUPPER   hydrALAZINE 50 MG tablet Commonly known as: APRESOLINE Take 1 tablet by mouth twice daily   linaclotide 290 MCG Caps capsule Commonly known as: Linzess TAKE 1 CAPSULE BY MOUTH ONCE DAILY BEFORE BREAKFAST   metoprolol tartrate 100 MG tablet Commonly known as: LOPRESSOR Take 1 tablet by mouth twice daily   oxyCODONE-acetaminophen 5-325 MG tablet Commonly known as: PERCOCET/ROXICET Take 1-2 tablets by mouth every 8 (eight) hours as needed for severe pain.   Rybelsus 3 MG Tabs Generic drug: Semaglutide Take 3 mg by mouth daily.   sildenafil 100 MG tablet Commonly known as: VIAGRA TAKE 1 TABLET BY MOUTH AS NEEDED FOR ERECTILE DYSFUNCTION   sitaGLIPtin 50 MG tablet Commonly known as: Januvia Take 1 tablet (50 mg total) by mouth daily.   tamsulosin 0.4 MG Caps capsule Commonly known as: FLOMAX TAKE 1 CAPSULE BY MOUTH ONCE DAILY AFTER SUPPER       Allergies:  Allergies  Allergen Reactions  . Ace Inhibitors Other (See Comments)    Severe AKI due to ARB + dehydration Aug 2016  . Angiotensin Receptor Blockers Other (See Comments)    Severe AKI due to ARB + dehydration Aug 2016    Past Medical History:  Diagnosis Date  . CHF (congestive heart failure) ([place])   . Chronic diastolic heart failure ([place])   . Constipation   . Diabetes mellitus   . Diverticulitis   . Diverticulosis   . ED (erectile dysfunction)   . Gout   . Heart murmur   . Hyperlipidemia   . Hypertension   . IBS (irritable bowel syndrome)   . Renal calculus   . Renal insufficiency   . Tubular adenoma of colon     Past Surgical History:  Procedure Laterality Date  . COLONOSCOPY  1998   negative; no F/U  .  EXTRACORPOREAL SHOCK WAVE LITHOTRIPSY Left 03/20/2019   Procedure: EXTRACORPOREAL SHOCK WAVE LITHOTRIPSY (ESWL);  Surgeon: Cleon Gustin, MD;  Location: [facility];  Service: Urology;  Laterality: Left;  . knee effusion tapped      Family History  Problem Relation Age of Onset  . Heart attack Mother 67  . Hypertension Mother   .  Prostate cancer Father 65  . Diabetes Father   . Irritable bowel syndrome Father   . Colon cancer Brother        dx in his late 23's  . Irritable bowel syndrome Sister        x 2  . Irritable bowel syndrome Brother   . Stroke Neg Hx   . Esophageal cancer Neg Hx   . Rectal cancer Neg Hx   . Stomach cancer Neg Hx     Social History:  reports that he quit smoking about 28 years ago. His smoking use included cigarettes. He smoked 0.30 packs per day. He has never used smokeless tobacco. He reports current alcohol use of about 2.0 standard drinks of alcohol per week. He reports that he does not use drugs.    Review of Systems   RENAL dysfunction: Creatinine is  variable, followed by nephrologist  Lab Results  Component Value Date   CREATININE 1.76 (H) 02/24/2020   CREATININE 1.54 (H) 12/22/2019   CREATININE 1.84 (H) 11/28/2019   CREATININE 1.99 (H) 09/23/2019     Lab Results  Component Value Date   K 4.3 02/24/2020          Lipids: Has been on Lipitor 20 mg for hyperlipidemia, baseline LDL about 162 Followed by Dr. Quay Burow  Lab Results  Component Value Date   CHOL 117 05/15/2019   HDL 40.70 05/15/2019   LDLCALC 61 05/15/2019   LDLDIRECT 80.1 04/15/2014   TRIG 80.0 05/15/2019   CHOLHDL 3 05/15/2019       The blood pressure has been high for several years and usually well controlled.  Currently taking, hydralazine, and metoprolol.  Blood pressure at home checked regularly  This is followed by his PCP  BP Readings from Last 3 Encounters:  02/26/20 120/70  02/23/20 127/75  01/07/20 112/60      LABS:  Lab on 02/24/2020   Component Date Value Ref Range Status  . Fructosamine 02/24/2020 241  0 - 285 umol/L Final   Comment: Published reference interval for apparently healthy subjects between age 16 and 37 is 29 - 285 umol/L and in a poorly controlled diabetic population is 228 - 563 umol/L with a mean of 396 umol/L.   [name] Sodium 02/24/2020 138  135 - 145 mEq/L Final  . Potassium 02/24/2020 4.3  3.5 - 5.1 mEq/L Final  . Chloride 02/24/2020 109  96 - 112 mEq/L Final  . CO2 02/24/2020 22  19 - 32 mEq/L Final  . Glucose, Bld 02/24/2020 124* 70 - 99 mg/dL Final  . BUN 02/24/2020 36* 6 - 23 mg/dL Final  . Creatinine, Ser 02/24/2020 1.76* 0.40 - 1.50 mg/dL Final  . Total Bilirubin 02/24/2020 0.6  0.2 - 1.2 mg/dL Final  . Alkaline Phosphatase 02/24/2020 69  39 - 117 U/L Final  . AST 02/24/2020 27  0 - 37 U/L Final  . ALT 02/24/2020 45  0 - 53 U/L Final  . Total Protein 02/24/2020 6.7  6.0 - 8.3 g/dL Final  . Albumin 02/24/2020 3.9  3.5 - 5.2 g/dL Final  . GFR 02/24/2020 46.14* >60.00 mL/min Final  . Calcium 02/24/2020 9.5  8.4 - 10.5 mg/dL Final    Physical Examination:  BP 120/70 (BP Location: Left Arm, Patient Position: Sitting, Cuff Size: Normal)   Pulse (!) 58   Ht '5\' 6"'  (1.676 m)   Wt 224 lb 6.4 oz (101.8 kg)   SpO2 95%   BMI 36.22 kg/m  ASSESSMENT/PLAN:   Diabetes type 2, with obesity  See history of present illness for detailed discussion of current diabetes management, blood sugar patterns and problems identified  He is on Rybelsus 3  mg and Amaryl 4 mg  A1c is last 6.4 and fructosamine of 241 indicates good control  His blood sugars are overall better with Rybelsus 3 mg compared to Januvia but his weight has not come down This could be from decreased exercise also Checking blood sugars usually twice a day, eating readings around 10 PM  Recommended that we go up to 7 mg on Rybelsus He needs to either go back to the gym or start doing indoor exercises using TV or video  programs  Patient Instructions  With next Rx 13m daily try using 1/2 Glimeperide  Walk in place at time       AElayne Snare8/28/2021, 3:32 PM

## 2020-02-26 NOTE — Patient Instructions (Signed)
With next Rx 7mg  daily try using 1/2 Glimeperide  Walk in place at time

## 2020-02-28 MED ORDER — RYBELSUS 7 MG PO TABS: 1.0000 | ORAL_TABLET | Freq: Every day | ORAL | 3 refills | Status: DC

## 2020-03-01 ENCOUNTER — Other Ambulatory Visit: Payer: Self-pay

## 2020-03-01 ENCOUNTER — Ambulatory Visit (INDEPENDENT_AMBULATORY_CARE_PROVIDER_SITE_OTHER): Payer: Medicare PPO

## 2020-03-01 DIAGNOSIS — Z23 Encounter for immunization: Secondary | ICD-10-CM

## 2020-03-02 ENCOUNTER — Other Ambulatory Visit: Payer: Self-pay | Admitting: Internal Medicine

## 2020-03-02 DIAGNOSIS — D631 Anemia in chronic kidney disease: Secondary | ICD-10-CM | POA: Diagnosis not present

## 2020-03-02 DIAGNOSIS — I129 Hypertensive chronic kidney disease with stage 1 through stage 4 chronic kidney disease, or unspecified chronic kidney disease: Secondary | ICD-10-CM | POA: Diagnosis not present

## 2020-03-02 DIAGNOSIS — N1831 Chronic kidney disease, stage 3a: Secondary | ICD-10-CM | POA: Diagnosis not present

## 2020-03-02 DIAGNOSIS — N2581 Secondary hyperparathyroidism of renal origin: Secondary | ICD-10-CM | POA: Diagnosis not present

## 2020-03-25 ENCOUNTER — Telehealth: Payer: Self-pay | Admitting: Hematology & Oncology

## 2020-03-25 ENCOUNTER — Encounter: Payer: Self-pay | Admitting: Hematology & Oncology

## 2020-03-25 ENCOUNTER — Other Ambulatory Visit: Payer: Self-pay

## 2020-03-25 ENCOUNTER — Inpatient Hospital Stay: Payer: Medicare PPO | Attending: Family | Admitting: Hematology & Oncology

## 2020-03-25 ENCOUNTER — Inpatient Hospital Stay: Payer: Medicare PPO

## 2020-03-25 VITALS — BP 142/78 | HR 53 | Temp 98.0°F | Resp 16 | Wt 224.0 lb

## 2020-03-25 DIAGNOSIS — R911 Solitary pulmonary nodule: Secondary | ICD-10-CM | POA: Diagnosis not present

## 2020-03-25 DIAGNOSIS — Z86718 Personal history of other venous thrombosis and embolism: Secondary | ICD-10-CM

## 2020-03-25 DIAGNOSIS — E119 Type 2 diabetes mellitus without complications: Secondary | ICD-10-CM | POA: Diagnosis not present

## 2020-03-25 DIAGNOSIS — N289 Disorder of kidney and ureter, unspecified: Secondary | ICD-10-CM | POA: Insufficient documentation

## 2020-03-25 DIAGNOSIS — Z86711 Personal history of pulmonary embolism: Secondary | ICD-10-CM

## 2020-03-25 DIAGNOSIS — Z7901 Long term (current) use of anticoagulants: Secondary | ICD-10-CM | POA: Diagnosis not present

## 2020-03-25 LAB — CBC WITH DIFFERENTIAL (CANCER CENTER ONLY)
Abs Immature Granulocytes: 0.02 10*3/uL (ref 0.00–0.07)
Basophils Absolute: 0 10*3/uL (ref 0.0–0.1)
Basophils Relative: 1 %
Eosinophils Absolute: 0.2 10*3/uL (ref 0.0–0.5)
Eosinophils Relative: 4 %
HCT: 42.7 % (ref 39.0–52.0)
Hemoglobin: 13.1 g/dL (ref 13.0–17.0)
Immature Granulocytes: 1 %
Lymphocytes Relative: 30 %
Lymphs Abs: 1.3 10*3/uL (ref 0.7–4.0)
MCH: 26.3 pg (ref 26.0–34.0)
MCHC: 30.7 g/dL (ref 30.0–36.0)
MCV: 85.7 fL (ref 80.0–100.0)
Monocytes Absolute: 0.5 10*3/uL (ref 0.1–1.0)
Monocytes Relative: 11 %
Neutro Abs: 2.4 10*3/uL (ref 1.7–7.7)
Neutrophils Relative %: 53 %
Platelet Count: 205 10*3/uL (ref 150–400)
RBC: 4.98 MIL/uL (ref 4.22–5.81)
RDW: 14.4 % (ref 11.5–15.5)
WBC Count: 4.4 10*3/uL (ref 4.0–10.5)
nRBC: 0 % (ref 0.0–0.2)

## 2020-03-25 LAB — CMP (CANCER CENTER ONLY)
ALT: 56 U/L — ABNORMAL HIGH (ref 0–44)
AST: 25 U/L (ref 15–41)
Albumin: 3.9 g/dL (ref 3.5–5.0)
Alkaline Phosphatase: 65 U/L (ref 38–126)
Anion gap: 8 (ref 5–15)
BUN: 38 mg/dL — ABNORMAL HIGH (ref 8–23)
CO2: 21 mmol/L — ABNORMAL LOW (ref 22–32)
Calcium: 9.4 mg/dL (ref 8.9–10.3)
Chloride: 109 mmol/L (ref 98–111)
Creatinine: 1.76 mg/dL — ABNORMAL HIGH (ref 0.61–1.24)
GFR, Est AFR Am: 43 mL/min — ABNORMAL LOW (ref >60)
GFR, Estimated: 38 mL/min — ABNORMAL LOW (ref >60)
Glucose, Bld: 135 mg/dL — ABNORMAL HIGH (ref 70–99)
Potassium: 4.6 mmol/L (ref 3.5–5.1)
Sodium: 138 mmol/L (ref 135–145)
Total Bilirubin: 0.7 mg/dL (ref 0.3–1.2)
Total Protein: 7 g/dL (ref 6.5–8.1)

## 2020-03-25 NOTE — Progress Notes (Signed)
Hematology and Oncology Follow Up Visit  YUNIEL BLANEY [id number] Apr 04, 1946 74 y.o. 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 twice daily as long-term maintenance     Interim History:  Mr. Knisley is back for follow-up.  He was followed by Dr. Maylon Peppers, very expertly.  He is doing quite nicely.  He was seen 6 months ago.  He is on low-dose Eliquis as maintenance.  He has had no problems with chest wall pain.  There is no nausea or vomiting.  There is no shortness of breath.  He has had no bleeding.  There is no change in bowel or bladder habits.  He did have a colonoscopy recently.  Everything turned out okay on the colonoscopy.  There is a lung nodule that is in the left lower lobe.  He had a chest CT scan done back on 11/13/2019.  There is a stable 4 mm left lower lobe nodule.    He has had his coronavirus vaccines.  His son is a gynecologist at [employer].  I am sure that he is quite busy.  Overall, his performance status is ECOG 0.  Medications:  Current Outpatient Medications:  .  Accu-Chek FastClix Lancets MISC, Use Accu Chek fastclix lancets to check blood sugar twice daily., Disp: 100 each, Rfl: 2 .  allopurinol (ZYLOPRIM) 300 MG tablet, Take 1 tablet by mouth once daily, Disp: 90 tablet, Rfl: 2 .  apixaban (ELIQUIS) 2.5 MG TABS tablet, Take by mouth 2 (two) times daily., Disp: , Rfl:  .  atorvastatin (LIPITOR) 20 MG tablet, Take 1 tablet by mouth once daily, Disp: 90 tablet, Rfl: 0 .  Blood Glucose Monitoring Suppl (ACCU-CHEK GUIDE ME) w/Device KIT, 1 each by Does not apply route in the morning and at bedtime. Use Accu Chek Guide me device to check blood sugar twice daily., Disp: 1 kit, Rfl: 0 .  FERREX 150 150 MG capsule, Take 1 capsule by mouth once daily, Disp: 90 capsule, Rfl: 0 .  furosemide (LASIX) 40 MG tablet, Take 40 mg by mouth daily., Disp: , Rfl:  .  glimepiride (AMARYL)  4 MG tablet, TAKE 1 TABLET BY MOUTH ONCE DAILY BEFORE SUPPER, Disp: 90 tablet, Rfl: 0 .  glucose blood (ACCU-CHEK GUIDE) test strip, Use Accu Chek Guide test strips as instructed to check blood sugar twice daily., Disp: 100 each, Rfl: 2 .  hydrALAZINE (APRESOLINE) 50 MG tablet, Take 1 tablet by mouth twice daily, Disp: 180 tablet, Rfl: 0 .  linaclotide (LINZESS) 290 MCG CAPS capsule, TAKE 1 CAPSULE BY MOUTH ONCE DAILY BEFORE BREAKFAST, Disp: 30 capsule, Rfl: 2 .  metoprolol tartrate (LOPRESSOR) 100 MG tablet, Take 1 tablet by mouth twice daily, Disp: 180 tablet, Rfl: 3 .  Semaglutide (RYBELSUS) 7 MG TABS, Take 1 tablet by mouth daily before breakfast. Take 30 minutes before breakfast with water, Disp: 30 tablet, Rfl: 3 .  sildenafil (VIAGRA) 100 MG tablet, TAKE 1 TABLET BY MOUTH AS NEEDED FOR ERECTILE DYSFUNCTION, Disp: 6 tablet, Rfl: 1 .  tamsulosin (FLOMAX) 0.4 MG CAPS capsule, TAKE 1 CAPSULE BY MOUTH ONCE DAILY AFTER SUPPER, Disp: 90 capsule, Rfl: 0 .  apixaban (ELIQUIS) 2.5 MG TABS tablet, Take 1 tablet (2.5 mg total) by mouth 2 (two) times daily., Disp: 180 tablet, Rfl: 3  Allergies:  Allergies  Allergen Reactions  . Ace Inhibitors Other (See Comments)    Severe AKI due to ARB +  dehydration Aug 2016  . Angiotensin Receptor Blockers Other (See Comments)    Severe AKI due to ARB + dehydration Aug 2016    Past Medical History, Surgical history, Social history, and Family History were reviewed and updated.  Review of Systems: Review of Systems  Constitutional: Negative.   HENT:  Negative.   Eyes: Negative.   Respiratory: Negative.   Cardiovascular: Negative.   Gastrointestinal: Negative.   Endocrine: Negative.   Genitourinary: Negative.    Musculoskeletal: Negative.   Skin: Negative.   Neurological: Negative.   Hematological: Negative.   Psychiatric/Behavioral: Negative.     Physical Exam:  weight is 224 lb (101.6 kg). His oral temperature is 98 F (36.7 C). His blood  pressure is 142/78 (abnormal) and his pulse is 53 (abnormal). His respiration is 16 and oxygen saturation is 100%.   Wt Readings from Last 3 Encounters:  03/25/20 224 lb (101.6 kg)  02/26/20 224 lb 6.4 oz (101.8 kg)  02/23/20 221 lb (100.2 kg)    Physical Exam Vitals reviewed.  HENT:     Head: Normocephalic and atraumatic.  Eyes:     Pupils: Pupils are equal, round, and reactive to light.  Cardiovascular:     Rate and Rhythm: Normal rate and regular rhythm.     Heart sounds: Normal heart sounds.  Pulmonary:     Effort: Pulmonary effort is normal.     Breath sounds: Normal breath sounds.  Abdominal:     General: Bowel sounds are normal.     Palpations: Abdomen is soft.  Musculoskeletal:        General: No tenderness or deformity. Normal range of motion.     Cervical back: Normal range of motion.  Lymphadenopathy:     Cervical: No cervical adenopathy.  Skin:    General: Skin is warm and dry.     Findings: No erythema or rash.  Neurological:     Mental Status: He is alert and oriented to person, place, and time.  Psychiatric:        Behavior: Behavior normal.        Thought Content: Thought content normal.        Judgment: Judgment normal.      Lab Results  Component Value Date   WBC 4.4 03/25/2020   HGB 13.1 03/25/2020   HCT 42.7 03/25/2020   MCV 85.7 03/25/2020   PLT 205 03/25/2020     Chemistry      Component Value Date/Time   NA 138 03/25/2020 1026   K 4.6 03/25/2020 1026   CL 109 03/25/2020 1026   CO2 21 (L) 03/25/2020 1026   BUN 38 (H) 03/25/2020 1026   CREATININE 1.76 (H) 03/25/2020 1026      Component Value Date/Time   CALCIUM 9.4 03/25/2020 1026   ALKPHOS 65 03/25/2020 1026   AST 25 03/25/2020 1026   ALT 56 (H) 03/25/2020 1026   BILITOT 0.7 03/25/2020 1026     Impression and Plan: Mr. Merriweather is a very nice 74 year old African-American male.  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  This was year ago.  He  was on therapeutic anticoagulation for 6 months.  He is now on lifelong low-dose anticoagulation with Eliquis.  He is doing well.  There is no obvious evidence of recurrence of his thromboembolic disease.  He does have renal insufficiency.  He does have type 2 diabetes.  I do not think we have to repeat the CT  scan of his chest.  Everything looked pretty stable.  We will plan to get him back in another 6 months.   Volanda Napoleon, MD 9/23/202111:45 AM

## 2020-03-25 NOTE — Telephone Encounter (Signed)
Appointments scheduled calendar printed & mailed per 9/23 los

## 2020-03-26 ENCOUNTER — Other Ambulatory Visit: Payer: Self-pay | Admitting: Endocrinology

## 2020-04-06 ENCOUNTER — Ambulatory Visit: Payer: Medicare PPO | Attending: Internal Medicine

## 2020-04-06 ENCOUNTER — Ambulatory Visit: Payer: Self-pay

## 2020-04-06 DIAGNOSIS — Z23 Encounter for immunization: Secondary | ICD-10-CM

## 2020-04-06 NOTE — Progress Notes (Signed)
   Covid-19 Vaccination [facility]  Name:  MAZIAH KEELING    MRN: [id number] DOB: August 09, 1945  04/06/2020  Mr. Haegele was observed post Covid-19 immunization for 15 minutes without incident. He was provided with Vaccine Information Sheet and instruction to access the V-Safe system.   Mr. Halls was instructed to call 911 with any severe reactions post vaccine: [name] Difficulty breathing  . Swelling of face and throat  . A fast heartbeat  . A bad rash all over body  . Dizziness and weakness

## 2020-04-07 ENCOUNTER — Other Ambulatory Visit: Payer: Self-pay | Admitting: Internal Medicine

## 2020-05-03 ENCOUNTER — Other Ambulatory Visit: Payer: Self-pay | Admitting: Internal Medicine

## 2020-05-03 ENCOUNTER — Other Ambulatory Visit: Payer: Self-pay | Admitting: Endocrinology

## 2020-05-05 ENCOUNTER — Ambulatory Visit (INDEPENDENT_AMBULATORY_CARE_PROVIDER_SITE_OTHER): Payer: Medicare PPO

## 2020-05-05 ENCOUNTER — Other Ambulatory Visit: Payer: Self-pay

## 2020-05-05 DIAGNOSIS — Z23 Encounter for immunization: Secondary | ICD-10-CM | POA: Diagnosis not present

## 2020-05-12 ENCOUNTER — Other Ambulatory Visit: Payer: Self-pay | Admitting: Internal Medicine

## 2020-05-17 ENCOUNTER — Other Ambulatory Visit: Payer: Self-pay

## 2020-05-17 ENCOUNTER — Other Ambulatory Visit (INDEPENDENT_AMBULATORY_CARE_PROVIDER_SITE_OTHER): Payer: Medicare PPO

## 2020-05-17 DIAGNOSIS — E782 Mixed hyperlipidemia: Secondary | ICD-10-CM

## 2020-05-17 DIAGNOSIS — E119 Type 2 diabetes mellitus without complications: Secondary | ICD-10-CM | POA: Diagnosis not present

## 2020-05-17 DIAGNOSIS — N200 Calculus of kidney: Secondary | ICD-10-CM | POA: Diagnosis not present

## 2020-05-17 LAB — COMPREHENSIVE METABOLIC PANEL
ALT: 54 U/L — ABNORMAL HIGH (ref 0–53)
AST: 25 U/L (ref 0–37)
Albumin: 3.9 g/dL (ref 3.5–5.2)
Alkaline Phosphatase: 71 U/L (ref 39–117)
BUN: 39 mg/dL — ABNORMAL HIGH (ref 6–23)
CO2: 22 mEq/L (ref 19–32)
Calcium: 9.2 mg/dL (ref 8.4–10.5)
Chloride: 111 mEq/L (ref 96–112)
Creatinine, Ser: 1.72 mg/dL — ABNORMAL HIGH (ref 0.40–1.50)
GFR: 38.99 mL/min — ABNORMAL LOW (ref >60.00)
Glucose, Bld: 112 mg/dL — ABNORMAL HIGH (ref 70–99)
Potassium: 4 mEq/L (ref 3.5–5.1)
Sodium: 140 mEq/L (ref 135–145)
Total Bilirubin: 0.6 mg/dL (ref 0.2–1.2)
Total Protein: 6.7 g/dL (ref 6.0–8.3)

## 2020-05-17 LAB — LIPID PANEL
Cholesterol: 120 mg/dL (ref 0–200)
HDL: 42.8 mg/dL (ref >39.00)
LDL Cholesterol: 65 mg/dL (ref 0–99)
NonHDL: 77.15
Total CHOL/HDL Ratio: 3
Triglycerides: 62 mg/dL (ref 0.0–149.0)
VLDL: 12.4 mg/dL (ref 0.0–40.0)

## 2020-05-17 LAB — HEMOGLOBIN A1C: Hgb A1c MFr Bld: 6.8 % — ABNORMAL HIGH (ref 4.6–6.5)

## 2020-05-21 ENCOUNTER — Other Ambulatory Visit: Payer: Self-pay

## 2020-05-21 ENCOUNTER — Ambulatory Visit: Payer: Medicare PPO | Admitting: Endocrinology

## 2020-05-21 VITALS — BP 110/80 | HR 65 | Ht 68.0 in | Wt 226.0 lb

## 2020-05-21 DIAGNOSIS — E1169 Type 2 diabetes mellitus with other specified complication: Secondary | ICD-10-CM | POA: Diagnosis not present

## 2020-05-21 DIAGNOSIS — I10 Essential (primary) hypertension: Secondary | ICD-10-CM

## 2020-05-21 DIAGNOSIS — R600 Localized edema: Secondary | ICD-10-CM

## 2020-05-21 DIAGNOSIS — E782 Mixed hyperlipidemia: Secondary | ICD-10-CM | POA: Diagnosis not present

## 2020-05-21 DIAGNOSIS — E669 Obesity, unspecified: Secondary | ICD-10-CM

## 2020-05-21 MED ORDER — RYBELSUS 14 MG PO TABS: ORAL_TABLET | ORAL | 3 refills | Status: DC

## 2020-05-21 NOTE — Progress Notes (Signed)
Patient ID: Noah Barnes, male   DOB: Jun 12, 1946, 74 y.o.   MRN: [id number]    Reason for Appointment : Follow-up visit  History of Present Illness          Diagnosis: Type 2 diabetes mellitus, date of diagnosis: 2008       Past history: His diabetes was borderline in the beginning and not clear what medications he was started on. Has been taking metformin for several years and this was later changed to [place]. Amaryl probably started in 5/14 when blood sugars were higher, initially was given 1 mg and now has been taking 2 mg. He had a relatively high A1c of 7.1, difficulty losing weight and a glucose of 202 on his initial consultation in 06/2013 He was then tried on Invokana in addition to his Janumet and Amaryl Invokana was reduced to 100 mg in early 2015 been creatinine had gone up to 1.6  Recent history:   Oral hypoglycemic drugs the patient is taking are: Amaryl 4 mg at supper, Rybelsus 7 mg daily   His A1c is slightly higher at 6.8 compared to 6.4, previous range 5.8-6.6 Fructosamine last 241  Current management, blood sugar patterns and problems identified:     He has been started on RYBELSUS 7 mg instead of 3 mg on his last visit  However his blood sugars may be at times a little higher including fasting  His weight is up 2 pounds  He is not exercising because of not wanting to go indoors to exercise at the gym  Did not bring his monitor for download today  He is taking his Rybelsus daily as directed 30-minute before eating  Also no hypoglycemia with continuing the 4 mg Amaryl at dinnertime No nausea with Rybelsus  Side effects from medications have been:  renal dysfunction from Invokana  Glucose monitoring:  done <1 times a day         Glucometer:  Accu-Chek   Blood Glucose readings by recall  Fasting usually 120-130 After meals usually under 160, occasionally 170 or over 200  Previous readings:  PRE-MEAL Fasting Lunch Dinner Bedtime Overall   Glucose range:  86-149   121  90-272 84-272  Mean/median:  117    135      Glycemic control:   Lab Results  Component Value Date   HGBA1C 6.8 (H) 05/17/2020   HGBA1C 6.4 12/22/2019   HGBA1C 6.6 (H) 08/18/2019   Lab Results  Component Value Date   MICROALBUR 3.5 (H) 05/15/2019   LDLCALC 65 05/17/2020   CREATININE 1.72 (H) 05/17/2020    Self-care: Usually has low-fat diet, eggs in am with some carbohydrate, frequently no lunch     Dietician consultations: 08/2013              Weight history:   Wt Readings from Last 3 Encounters:  05/21/20 226 lb (102.5 kg)  03/25/20 224 lb (101.6 kg)  02/26/20 224 lb 6.4 oz (101.8 kg)       Allergies as of 05/21/2020      Reactions   Ace Inhibitors Other (See Comments)   Severe AKI due to ARB + dehydration Aug 2016   Angiotensin Receptor Blockers Other (See Comments)   Severe AKI due to ARB + dehydration Aug 2016      Medication List       Accurate as of May 21, 2020  9:48 AM. If you have any questions, ask your nurse or doctor.  Accu-Chek Guide Me w/Device Kit 1 each by Does not apply route in the morning and at bedtime. Use Accu Chek Guide me device to check blood sugar twice daily.   Accu-Chek Guide test strip Generic drug: glucose blood TEST BLOOD SUGAR TWICE DAILY AS INSTRUCTED   Accu-Chek Softclix Lancets lancets CHECK BLOOD SUGAR TWICE DAILY AS DIRECTED   allopurinol 300 MG tablet Commonly known as: ZYLOPRIM Take 1 tablet by mouth once daily   Eliquis 2.5 MG Tabs tablet Generic drug: apixaban Take by mouth 2 (two) times daily.   apixaban 2.5 MG Tabs tablet Commonly known as: Eliquis Take 1 tablet (2.5 mg total) by mouth 2 (two) times daily.   atorvastatin 20 MG tablet Commonly known as: LIPITOR Take 1 tablet by mouth once daily   Ferrex 150 150 MG capsule Generic drug: iron polysaccharides Take 1 capsule by mouth once daily   furosemide 40 MG tablet Commonly known as: LASIX Take 40 mg  by mouth daily.   glimepiride 4 MG tablet Commonly known as: AMARYL TAKE 1 TABLET BY MOUTH ONCE DAILY BEFORE SUPPER   hydrALAZINE 50 MG tablet Commonly known as: APRESOLINE Take 1 tablet by mouth twice daily   Linzess 290 MCG Caps capsule Generic drug: linaclotide TAKE 1 CAPSULE BY MOUTH ONCE DAILY BEFORE BREAKFAST   metoprolol tartrate 100 MG tablet Commonly known as: LOPRESSOR Take 1 tablet by mouth twice daily   Rybelsus 7 MG Tabs Generic drug: Semaglutide Take 1 tablet by mouth daily before breakfast. Take 30 minutes before breakfast with water   sildenafil 100 MG tablet Commonly known as: VIAGRA TAKE 1 TABLET BY MOUTH AS NEEDED FOR ERECTILE DYSFUNCTION   tamsulosin 0.4 MG Caps capsule Commonly known as: FLOMAX TAKE 1 CAPSULE BY MOUTH ONCE DAILY AFTER SUPPER       Allergies:  Allergies  Allergen Reactions  . Ace Inhibitors Other (See Comments)    Severe AKI due to ARB + dehydration Aug 2016  . Angiotensin Receptor Blockers Other (See Comments)    Severe AKI due to ARB + dehydration Aug 2016    Past Medical History:  Diagnosis Date  . CHF (congestive heart failure) ([place])   . Chronic diastolic heart failure ([place])   . Constipation   . Diabetes mellitus   . Diverticulitis   . Diverticulosis   . ED (erectile dysfunction)   . Gout   . Heart murmur   . Hyperlipidemia   . Hypertension   . IBS (irritable bowel syndrome)   . Renal calculus   . Renal insufficiency   . Tubular adenoma of colon     Past Surgical History:  Procedure Laterality Date  . COLONOSCOPY  1998   negative; no F/U  . EXTRACORPOREAL SHOCK WAVE LITHOTRIPSY Left 03/20/2019   Procedure: EXTRACORPOREAL SHOCK WAVE LITHOTRIPSY (ESWL);  Surgeon: Cleon Gustin, MD;  Location: [facility];  Service: Urology;  Laterality: Left;  . knee effusion tapped      Family History  Problem Relation Age of Onset  . Heart attack Mother 56  . Hypertension Mother   . Prostate cancer Father 49  .  Diabetes Father   . Irritable bowel syndrome Father   . Colon cancer Brother        dx in his late 62's  . Irritable bowel syndrome Sister        x 2  . Irritable bowel syndrome Brother   . Stroke Neg Hx   . Esophageal cancer Neg Hx   .  Rectal cancer Neg Hx   . Stomach cancer Neg Hx     Social History:  reports that he quit smoking about 28 years ago. His smoking use included cigarettes. He smoked 0.30 packs per day. He has never used smokeless tobacco. He reports current alcohol use of about 2.0 standard drinks of alcohol per week. He reports that he does not use drugs.    Review of Systems   RENAL dysfunction: Creatinine is as follows, followed by nephrologist  Lab Results  Component Value Date   CREATININE 1.72 (H) 05/17/2020   CREATININE 1.76 (H) 03/25/2020   CREATININE 1.76 (H) 02/24/2020   CREATININE 1.54 (H) 12/22/2019     Lab Results  Component Value Date   K 4.0 05/17/2020          Lipids: Has been on Lipitor 20 mg for hyperlipidemia, baseline LDL about 162 Followed by Dr. Quay Burow  Lab Results  Component Value Date   CHOL 120 05/17/2020   HDL 42.80 05/17/2020   LDLCALC 65 05/17/2020   LDLDIRECT 80.1 04/15/2014   TRIG 62.0 05/17/2020   CHOLHDL 3 05/17/2020       The blood pressure has been high for several years and usually well controlled.  Currently taking, hydralazine, and metoprolol.  Blood pressure at home checked regularly  This is followed by his PCP  BP Readings from Last 3 Encounters:  05/21/20 110/80  03/25/20 (!) 142/78  02/26/20 120/70      LABS:  Lab on 05/17/2020  Component Date Value Ref Range Status  . Cholesterol 05/17/2020 120  0 - 200 mg/dL Final   ATP III Classification       Desirable:  < 200 mg/dL               Borderline High:  200 - 239 mg/dL          High:  > = 240 mg/dL  . Triglycerides 05/17/2020 62.0  0 - 149 mg/dL Final   Normal:  <150 mg/dLBorderline High:  150 - 199 mg/dL  . HDL 05/17/2020 42.80  >39.00 mg/dL  Final  . VLDL 05/17/2020 12.4  0.0 - 40.0 mg/dL Final  . LDL Cholesterol 05/17/2020 65  0 - 99 mg/dL Final  . Total CHOL/HDL Ratio 05/17/2020 3   Final                  Men          Women1/2 Average Risk     3.4          3.3Average Risk          5.0          4.42X Average Risk          9.6          7.13X Average Risk          15.0          11.0                      . NonHDL 05/17/2020 77.15   Final   NOTE:  Non-HDL goal should be 30 mg/dL higher than patient's LDL goal (i.e. LDL goal of < 70 mg/dL, would have non-HDL goal of < 100 mg/dL)  . Sodium 05/17/2020 140  135 - 145 mEq/L Final  . Potassium 05/17/2020 4.0  3.5 - 5.1 mEq/L Final  . Chloride 05/17/2020 111  96 - 112 mEq/L Final  . CO2 05/17/2020 22  19 - 32 mEq/L Final  . Glucose, Bld 05/17/2020 112* 70 - 99 mg/dL Final  . BUN 05/17/2020 39* 6 - 23 mg/dL Final  . Creatinine, Ser 05/17/2020 1.72* 0.40 - 1.50 mg/dL Final  . Total Bilirubin 05/17/2020 0.6  0.2 - 1.2 mg/dL Final  . Alkaline Phosphatase 05/17/2020 71  39 - 117 U/L Final  . AST 05/17/2020 25  0 - 37 U/L Final  . ALT 05/17/2020 54* 0 - 53 U/L Final  . Total Protein 05/17/2020 6.7  6.0 - 8.3 g/dL Final  . Albumin 05/17/2020 3.9  3.5 - 5.2 g/dL Final  . GFR 05/17/2020 38.99* >60.00 mL/min Final   Calculated using the CKD-EPI Creatinine Equation (2021)  . Calcium 05/17/2020 9.2  8.4 - 10.5 mg/dL Final  . Hgb A1c MFr Bld 05/17/2020 6.8* 4.6 - 6.5 % Final   Glycemic Control Guidelines for People with Diabetes:Non Diabetic:  <6%Goal of Therapy: <7%Additional Action Suggested:  >8%     Physical Examination:  BP 110/80   Pulse 65   Ht _0  (1.727 m)   Wt 226 lb (102.5 kg)   SpO2 97%   BMI 34.36 kg/m    Diabetic Foot Exam - Simple   Simple Foot Form Diabetic Foot exam was performed with the following findings: Yes   Visual Inspection No deformities, no ulcerations, no other skin breakdown bilaterally: Yes Sensation Testing Intact to touch and monofilament testing  bilaterally: Yes Pulse Check Posterior Tibialis and Dorsalis pulse intact bilaterally: Yes Comments    Mild pedal edema present    ASSESSMENT/PLAN:   Diabetes type 2, with obesity  See history of present illness for detailed discussion of current diabetes management, blood sugar patterns and problems identified  He is on Rybelsus 7 mg and Amaryl 4 mg  A1c is 6.8  Blood sugars are relatively higher even though initially they were better with starting Rybelsus 3 mg  He may not be consistent with diet and exercise is also lacking Unable to review his meter today  Recommended that we go up to 14 mg on Rybelsus which will help him with more satiety as well as better overall control Continue Amaryl but may need to cut back to half tablet if he starts getting low normal sugars Discussed blood sugar targets both fasting and after meals He needs to bring his monitor for download on each visit  Also will try to have him work on cutting back on portions of foods that are increasing his blood sugars  He thinks he can start back exercising at the gym soon and at least go there 3 days a week  LIPIDS: Well-controlled and he will continue atorvastatin as before  There are no Patient Instructions on file for this visit.    Elayne Snare 05/21/2020, 9:48 AM

## 2020-05-31 ENCOUNTER — Other Ambulatory Visit: Payer: Self-pay | Admitting: Internal Medicine

## 2020-06-07 ENCOUNTER — Other Ambulatory Visit: Payer: Self-pay | Admitting: Internal Medicine

## 2020-06-17 ENCOUNTER — Other Ambulatory Visit: Payer: Self-pay | Admitting: Endocrinology

## 2020-06-21 ENCOUNTER — Other Ambulatory Visit: Payer: Self-pay | Admitting: Internal Medicine

## 2020-07-05 ENCOUNTER — Other Ambulatory Visit: Payer: Self-pay | Admitting: *Deleted

## 2020-07-05 DIAGNOSIS — N400 Enlarged prostate without lower urinary tract symptoms: Secondary | ICD-10-CM | POA: Insufficient documentation

## 2020-07-05 MED ORDER — APIXABAN 2.5 MG PO TABS: 2.5000 mg | ORAL_TABLET | Freq: Two times a day (BID) | ORAL | 3 refills | Status: DC

## 2020-07-05 NOTE — Patient Instructions (Addendum)
   Medications changes include :   none    Please followup in 6 months   

## 2020-07-05 NOTE — Progress Notes (Signed)
Subjective:    Patient ID: Noah Barnes, male    DOB: April 13, 1946, 75 y.o.   MRN: [id number]  HPI The patient is here for follow up of their chronic medical problems, including CKD, htn, hyperlipidemia, DM, gout, h/o DVT/PE, gout   Has b/l knee pain.  He has not taken anything or seen anyone for this.  He is not exercising regularly.       Medications and allergies reviewed with patient and updated if appropriate.  Patient Active Problem List   Diagnosis Date Noted  . BPH (benign prostatic hyperplasia) 07/05/2020  . Left lower quadrant abdominal pain 11/28/2019  . Abnormal findings on diagnostic imaging of lung 04/04/2019  . History of DVT of lower extremity, b/l 04/03/2019  . History of pulmonary embolus (PE) 04/03/2019  . Hypomagnesemia 04/03/2019  . HCAP (healthcare-associated pneumonia) 03/24/2019  . Nephrolithiasis 03/06/2019  . Seasonal allergic rhinitis 05/22/2018  . MR (mitral regurgitation) 12/20/2017  . CKD (chronic kidney disease) stage 3, GFR 30-59 ml/min (HCC) 08/14/2017  . Abdominal hernia without obstruction and without gangrene 01/10/2016  . Chronic diastolic heart failure ([place]) 02/25/2015  . Right renal mass 02/25/2015  . Other constipation 02/22/2015  . Chronic venous insufficiency 01/18/2015  . Superficial phlebitis 01/18/2015  . IBS (irritable bowel syndrome) 11/12/2013  . Family history of prostate cancer 12/26/2011  . Diabetes mellitus with renal complications ([place]) 97/98/9211  . Gout 12/31/2007  . HYPERLIPIDEMIA 08/14/2007  . ERECTILE DYSFUNCTION 05/09/2007  . Essential hypertension 11/28/2006    Current Outpatient Medications on File Prior to Visit  Medication Sig Dispense Refill  . Accu-Chek Softclix Lancets lancets CHECK BLOOD SUGAR TWICE DAILY AS DIRECTED 100 each 2  . allopurinol (ZYLOPRIM) 300 MG tablet Take 1 tablet by mouth once daily 90 tablet 2  . apixaban (ELIQUIS) 2.5 MG TABS tablet Take 1 tablet (2.5 mg total) by mouth 2 (two)  times daily. 60 tablet 3  . atorvastatin (LIPITOR) 20 MG tablet Take 1 tablet by mouth once daily 90 tablet 0  . Blood Glucose Monitoring Suppl (ACCU-CHEK GUIDE ME) w/Device KIT 1 each by Does not apply route in the morning and at bedtime. Use Accu Chek Guide me device to check blood sugar twice daily. 1 kit 0  . FERREX 150 150 MG capsule Take 1 capsule by mouth once daily 90 capsule 0  . furosemide (LASIX) 40 MG tablet Take 40 mg by mouth daily.    [name] glimepiride (AMARYL) 4 MG tablet TAKE 1 TABLET BY MOUTH ONCE DAILY BEFORE SUPPER 90 tablet 1  . glucose blood (ACCU-CHEK GUIDE) test strip TEST BLOOD SUGAR TWICE DAILY AS INSTRUCTED 100 strip 3  . hydrALAZINE (APRESOLINE) 50 MG tablet Take 1 tablet by mouth twice daily 180 tablet 0  . LINZESS 290 MCG CAPS capsule TAKE 1 CAPSULE BY MOUTH ONCE DAILY BEFORE BREAKFAST 30 capsule 4  . metoprolol tartrate (LOPRESSOR) 100 MG tablet Take 1 tablet by mouth twice daily 180 tablet 3  . Semaglutide (RYBELSUS) 14 MG TABS Take 1 tablet 30 minutes before breakfast daily with a sip of water 30 tablet 3  . sildenafil (VIAGRA) 100 MG tablet TAKE 1 TABLET BY MOUTH AS NEEDED FOR ERECTILE DYSFUNCTION 6 tablet 1  . tamsulosin (FLOMAX) 0.4 MG CAPS capsule TAKE 1 CAPSULE BY MOUTH ONCE DAILY AFTER SUPPER 90 capsule 0   No current facility-administered medications on file prior to visit.    Past Medical History:  Diagnosis Date  . CHF (congestive heart failure) (  [place])   . Chronic diastolic heart failure ([place])   . Constipation   . Diabetes mellitus   . Diverticulitis   . Diverticulosis   . ED (erectile dysfunction)   . Gout   . Heart murmur   . Hyperlipidemia   . Hypertension   . IBS (irritable bowel syndrome)   . Renal calculus   . Renal insufficiency   . Tubular adenoma of colon     Past Surgical History:  Procedure Laterality Date  . COLONOSCOPY  1998   negative; no F/U  . EXTRACORPOREAL SHOCK WAVE LITHOTRIPSY Left 03/20/2019   Procedure: EXTRACORPOREAL  SHOCK WAVE LITHOTRIPSY (ESWL);  Surgeon: Cleon Gustin, MD;  Location: [facility];  Service: Urology;  Laterality: Left;  . knee effusion tapped      Social History   Socioeconomic History  . Marital status: Married    Spouse name: Not on file  . Number of children: 1  . Years of education: Not on file  . Highest education level: Not on file  Occupational History  . Occupation: Metallurgist: [employer]    Comment: [phone]  Tobacco Use  . Smoking status: Former Smoker    Packs/day: 0.30    Types: Cigarettes    Quit date: 07/04/1991    Years since quitting: 29.0  . Smokeless tobacco: Never Used  . Tobacco comment: smoked age [age], up to 1/3 ppd; may be less  Vaping Use  . Vaping Use: Never used  Substance and Sexual Activity  . Alcohol use: Yes    Alcohol/week: 2.0 standard drinks    Types: 2 Glasses of wine per week    Comment: rare  . Drug use: No  . Sexual activity: Yes  Other Topics Concern  . Not on file  Social History Narrative  . Not on file   Social Determinants of Health   Financial Resource Strain: Not on file  Food Insecurity: Not on file  Transportation Needs: Not on file  Physical Activity: Not on file  Stress: Not on file  Social Connections: Not on file    Family History  Problem Relation Age of Onset  . Heart attack Mother 18  . Hypertension Mother   . Prostate cancer Father 61  . Diabetes Father   . Irritable bowel syndrome Father   . Colon cancer Brother        dx in his late 23's  . Irritable bowel syndrome Sister        x 2  . Irritable bowel syndrome Brother   . Stroke Neg Hx   . Esophageal cancer Neg Hx   . Rectal cancer Neg Hx   . Stomach cancer Neg Hx     Review of Systems  Constitutional: Negative for fever.  Respiratory: Negative for cough, shortness of breath and wheezing.   Cardiovascular: Positive for leg swelling (mild). Negative for chest pain and palpitations.  Musculoskeletal:  Positive for arthralgias (b/l knee OA).  Neurological: Negative for light-headedness and headaches.       Objective:   Vitals:   07/06/20 1007  BP: 116/78  Pulse: 72  Temp: 98.9 F (37.2 C)  SpO2: 96%   BP Readings from Last 3 Encounters:  07/06/20 116/78  05/21/20 110/80  03/25/20 (!) 142/78   Wt Readings from Last 3 Encounters:  07/06/20 222 lb (100.7 kg)  05/21/20 226 lb (102.5 kg)  03/25/20 224 lb (101.6 kg)   Body mass index  is 33.75 kg/m.   Physical Exam    Constitutional: Appears well-developed and well-nourished. No distress.  HENT:  Head: Normocephalic and atraumatic.  Neck: Neck supple. No tracheal deviation present. No thyromegaly present.  No cervical lymphadenopathy Cardiovascular: Normal rate, regular rhythm and normal heart sounds.  No murmur heard. No carotid bruit .  No edema Pulmonary/Chest: Effort normal and breath sounds normal. No respiratory distress. No has no wheezes. No rales.  Skin: Skin is warm and dry. Not diaphoretic.  Psychiatric: Normal mood and affect. Behavior is normal.      Assessment & Plan:    See Problem List for Assessment and Plan of chronic medical problems.    This visit occurred during the SARS-CoV-2 public health emergency.  Safety protocols were in place, including screening questions prior to the visit, additional usage of staff PPE, and extensive cleaning of exam room while observing appropriate contact time as indicated for disinfecting solutions.

## 2020-07-06 ENCOUNTER — Other Ambulatory Visit: Payer: Self-pay

## 2020-07-06 ENCOUNTER — Encounter: Payer: Self-pay | Admitting: Internal Medicine

## 2020-07-06 ENCOUNTER — Ambulatory Visit: Payer: Medicare PPO | Admitting: Internal Medicine

## 2020-07-06 VITALS — BP 116/78 | HR 72 | Temp 98.9°F | Ht 68.0 in | Wt 222.0 lb

## 2020-07-06 DIAGNOSIS — M109 Gout, unspecified: Secondary | ICD-10-CM | POA: Diagnosis not present

## 2020-07-06 DIAGNOSIS — N401 Enlarged prostate with lower urinary tract symptoms: Secondary | ICD-10-CM | POA: Diagnosis not present

## 2020-07-06 DIAGNOSIS — E782 Mixed hyperlipidemia: Secondary | ICD-10-CM | POA: Diagnosis not present

## 2020-07-06 DIAGNOSIS — Z86711 Personal history of pulmonary embolism: Secondary | ICD-10-CM | POA: Diagnosis not present

## 2020-07-06 DIAGNOSIS — E1129 Type 2 diabetes mellitus with other diabetic kidney complication: Secondary | ICD-10-CM

## 2020-07-06 DIAGNOSIS — I10 Essential (primary) hypertension: Secondary | ICD-10-CM | POA: Diagnosis not present

## 2020-07-06 DIAGNOSIS — N1832 Chronic kidney disease, stage 3b: Secondary | ICD-10-CM | POA: Diagnosis not present

## 2020-07-06 NOTE — Assessment & Plan Note (Signed)
Chronic Check lipid panel  Continue lipitor 20 mg daily Regular exercise and healthy diet encouraged  

## 2020-07-06 NOTE — Assessment & Plan Note (Signed)
Chronic BP well controlled Continue metoprolol 100 mg BID, hydralazine 50 mg BID, lasix 40 mg daily

## 2020-07-06 NOTE — Assessment & Plan Note (Signed)
Chronic Following with Dr Dwyane Dee  Lab Results  Component Value Date   HGBA1C 6.8 (H) 05/17/2020   Well controlled

## 2020-07-06 NOTE — Assessment & Plan Note (Signed)
Chronic Following with nephrology  

## 2020-07-06 NOTE — Assessment & Plan Note (Signed)
Chronic Following with hem-onc On eliquis 2.5 mg BID Denies abn bruising or bleeding

## 2020-07-06 NOTE — Assessment & Plan Note (Signed)
Chronic Continue flomax 0.4 mg daily

## 2020-07-06 NOTE — Assessment & Plan Note (Signed)
Chronic No gout since his last visit continue allopurinol  300 mg daily

## 2020-07-21 DIAGNOSIS — Z20828 Contact with and (suspected) exposure to other viral communicable diseases: Secondary | ICD-10-CM | POA: Diagnosis not present

## 2020-07-28 ENCOUNTER — Other Ambulatory Visit: Payer: Self-pay

## 2020-07-28 ENCOUNTER — Telehealth (INDEPENDENT_AMBULATORY_CARE_PROVIDER_SITE_OTHER): Payer: Medicare PPO | Admitting: Internal Medicine

## 2020-07-28 ENCOUNTER — Encounter: Payer: Self-pay | Admitting: Internal Medicine

## 2020-07-28 DIAGNOSIS — J42 Unspecified chronic bronchitis: Secondary | ICD-10-CM | POA: Insufficient documentation

## 2020-07-28 DIAGNOSIS — J40 Bronchitis, not specified as acute or chronic: Secondary | ICD-10-CM | POA: Insufficient documentation

## 2020-07-28 DIAGNOSIS — J209 Acute bronchitis, unspecified: Secondary | ICD-10-CM

## 2020-07-28 MED ORDER — PROMETHAZINE-DM 6.25-15 MG/5ML PO SYRP: 5.0000 mL | ORAL_SOLUTION | Freq: Four times a day (QID) | ORAL | 0 refills | Status: DC | PRN

## 2020-07-28 MED ORDER — CEFDINIR 300 MG PO CAPS: 300.0000 mg | ORAL_CAPSULE | Freq: Two times a day (BID) | ORAL | 0 refills | Status: DC

## 2020-07-28 NOTE — Assessment & Plan Note (Addendum)
Acute Has h/o of bronchitis - these symptoms are similar to his previous episodes covid test negative Start Omnicef 300 mg twice daily x10 days He would like something stronger for the cough-trial of promethazine DM cough syrup every 4 hours as needed Continue Mucinex and other over-the-counter cold medication for symptom relief Rest, fluids Call if no improvement

## 2020-07-28 NOTE — Progress Notes (Addendum)
Virtual Visit via telephone note  I connected with Noah Barnes on 07/28/20 at  3:30 PM EST by telephone and verified that I am speaking with the correct person using two identifiers.   I discussed the limitations of evaluation and management by telemedicine and the availability of in person appointments. The patient expressed understanding and agreed to proceed.  Present for the visit:  Myself, Dr Billey Gosling, Lynnda Shields.  The patient is currently at home and I am in the office.    No referring provider.    History of Present Illness: He is here for an acute visit for cold symptoms.  His symptoms started 1 week - 10 days  He is experiencing mild nasal congestion, sore throat which she feels is related to his cough, cough that is bringing up a little bit of sputum, little bit of wheezing and tightness in his chest and some headaches.  He has not had any fevers.  He has tried taking mucinex  He had a covid test and it was negative.  He does have a history of bronchitis and typically gets this once a year at this time of year.  His symptoms now are consistent with his previous episodes of bronchitis.  He has had pneumonia in the past and is concerned about his symptoms getting worse.  He does have diabetes and his sugars have been controlled.  Sugar this morning 112.  Review of Systems  Constitutional: Negative for chills and fever.  HENT: Positive for congestion (mild) and sore throat (related to cough). Negative for ear pain and sinus pain.   Respiratory: Positive for cough, sputum production and wheezing (little). Negative for shortness of breath.        Little tightness  Neurological: Positive for headaches. Negative for dizziness.      Social History   Socioeconomic History  . Marital status: Married    Spouse name: Not on file  . Number of children: 1  . Years of education: Not on file  . Highest education level: Not on file  Occupational History  . 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 Smoking status: Former Smoker    Packs/day: 0.30    Types: Cigarettes    Quit date: 07/04/1991    Years since quitting: 29.0  . Smokeless tobacco: Never Used  . Tobacco comment: smoked age [age], up to 1/3 ppd; may be less  Vaping Use  . Vaping Use: Never used  Substance and Sexual Activity  . Alcohol use: Yes    Alcohol/week: 2.0 standard drinks    Types: 2 Glasses of wine per week    Comment: rare  . Drug use: No  . Sexual activity: Yes  Other Topics Concern  . Not on file  Social History Narrative  . Not on file   Social Determinants of Health   Financial Resource Strain: Not on file  Food Insecurity: Not on file  Transportation Needs: Not on file  Physical Activity: Not on file  Stress: Not on file  Social Connections: Not on file         Assessment and Plan:  See Problem List for Assessment and Plan of chronic medical problems.   Follow Up Instructions:    I discussed the assessment and treatment plan with the patient. The patient was provided an opportunity to ask questions and all were answered. The patient agreed with the  plan and demonstrated an understanding of the instructions.   The patient was advised to call back or seek an in-person evaluation if the symptoms worsen or if the condition fails to improve as anticipated.  Time spent in telephone call: 8 minutes  Binnie Rail, MD

## 2020-08-06 ENCOUNTER — Other Ambulatory Visit: Payer: Self-pay | Admitting: Internal Medicine

## 2020-08-10 ENCOUNTER — Other Ambulatory Visit: Payer: Self-pay | Admitting: Internal Medicine

## 2020-08-26 ENCOUNTER — Other Ambulatory Visit: Payer: Self-pay | Admitting: Internal Medicine

## 2020-08-30 ENCOUNTER — Other Ambulatory Visit (INDEPENDENT_AMBULATORY_CARE_PROVIDER_SITE_OTHER): Payer: Medicare PPO

## 2020-08-30 ENCOUNTER — Other Ambulatory Visit: Payer: Self-pay

## 2020-08-30 DIAGNOSIS — E1169 Type 2 diabetes mellitus with other specified complication: Secondary | ICD-10-CM | POA: Diagnosis not present

## 2020-08-30 DIAGNOSIS — E669 Obesity, unspecified: Secondary | ICD-10-CM

## 2020-08-30 LAB — MICROALBUMIN / CREATININE URINE RATIO
Creatinine,U: 214.2 mg/dL
Microalb Creat Ratio: 4.6 mg/g (ref 0.0–30.0)
Microalb, Ur: 9.8 mg/dL — ABNORMAL HIGH (ref 0.0–1.9)

## 2020-08-30 LAB — COMPREHENSIVE METABOLIC PANEL
ALT: 44 U/L (ref 0–53)
AST: 19 U/L (ref 0–37)
Albumin: 3.7 g/dL (ref 3.5–5.2)
Alkaline Phosphatase: 69 U/L (ref 39–117)
BUN: 27 mg/dL — ABNORMAL HIGH (ref 6–23)
CO2: 19 mEq/L (ref 19–32)
Calcium: 9.1 mg/dL (ref 8.4–10.5)
Chloride: 111 mEq/L (ref 96–112)
Creatinine, Ser: 1.47 mg/dL (ref 0.40–1.50)
GFR: 46.98 mL/min — ABNORMAL LOW (ref >60.00)
Glucose, Bld: 116 mg/dL — ABNORMAL HIGH (ref 70–99)
Potassium: 3.7 mEq/L (ref 3.5–5.1)
Sodium: 140 mEq/L (ref 135–145)
Total Bilirubin: 0.5 mg/dL (ref 0.2–1.2)
Total Protein: 6.5 g/dL (ref 6.0–8.3)

## 2020-08-30 LAB — HEMOGLOBIN A1C: Hgb A1c MFr Bld: 6.1 % (ref 4.6–6.5)

## 2020-08-31 IMAGING — US US RENAL
2 series · 13 of 25 positions shown · non-contrast
Comparison: Noncontrast CT 03/24/2019

CLINICAL DATA: Acute kidney injury. Technologist notes state
lithotripsy on the right 1 week ago.

EXAM:
RENAL / URINARY TRACT ULTRASOUND COMPLETE

[Series 1: us renal · 12 of 148 slices shown (1 of 2)]
[im 1/148]
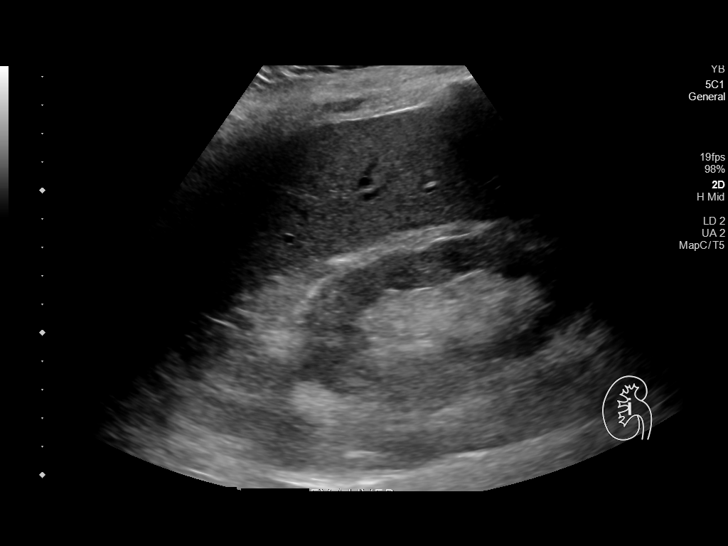
[im 13/148]
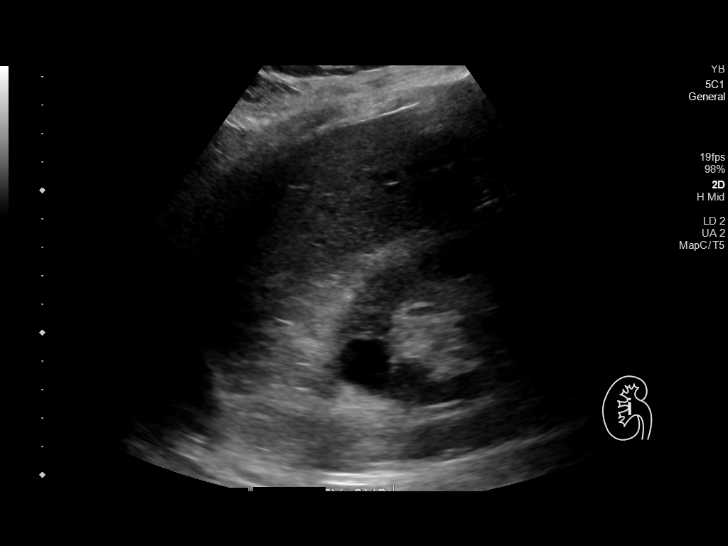
[im 26/148]
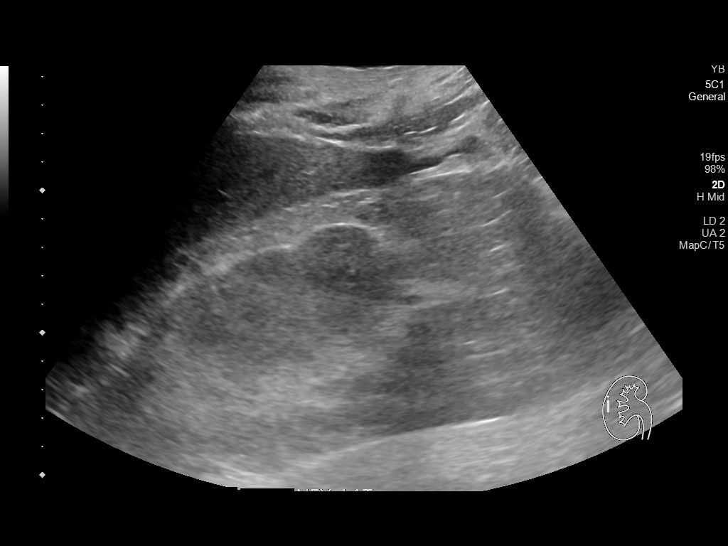
[im 39/148]
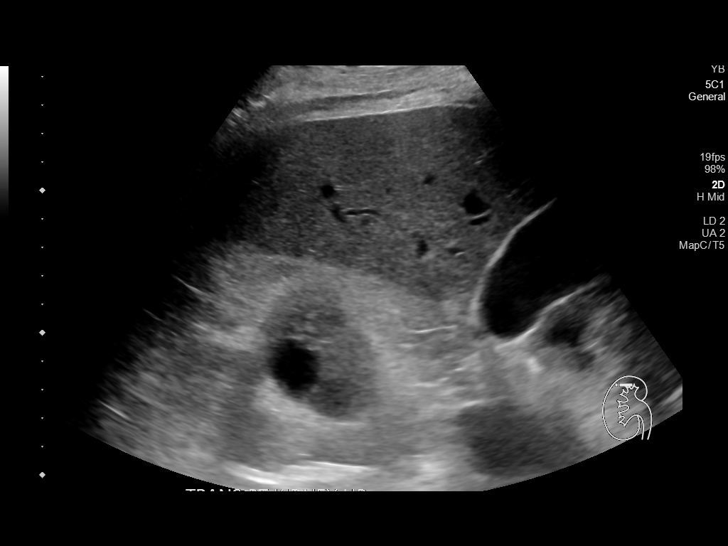
[im 52/148]
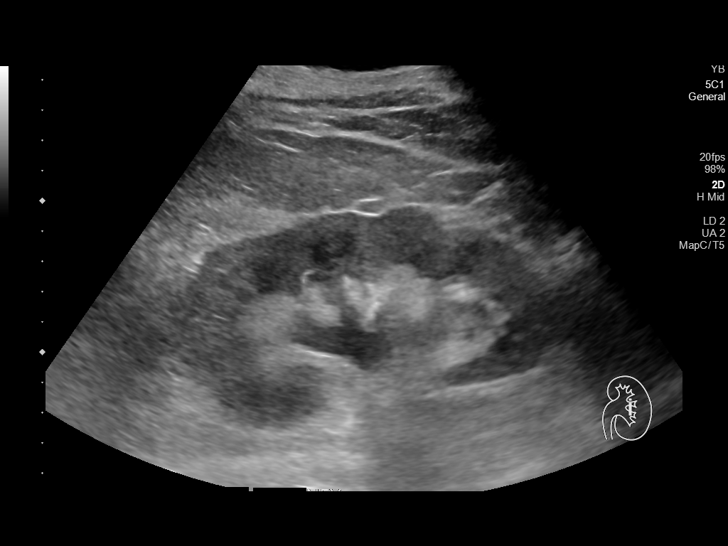
[im 64/148]
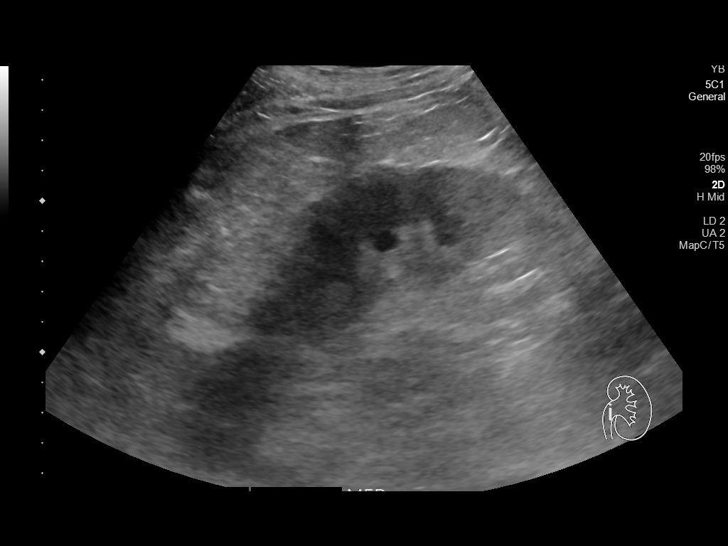
[im 77/148]
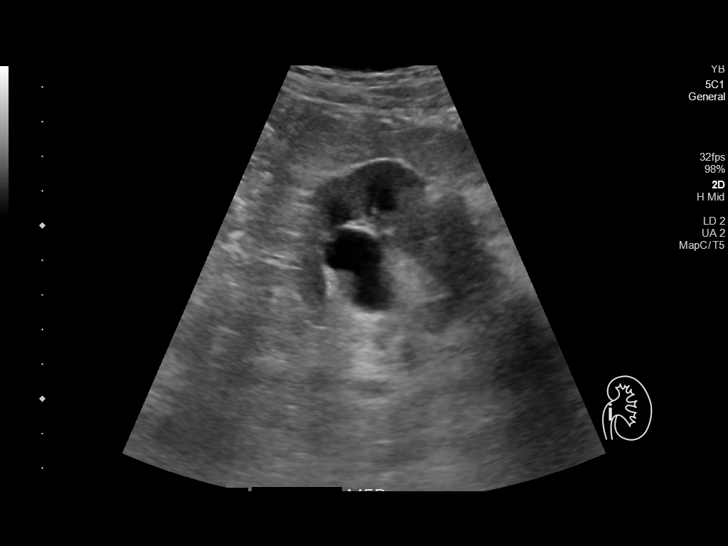
[im 90/148]
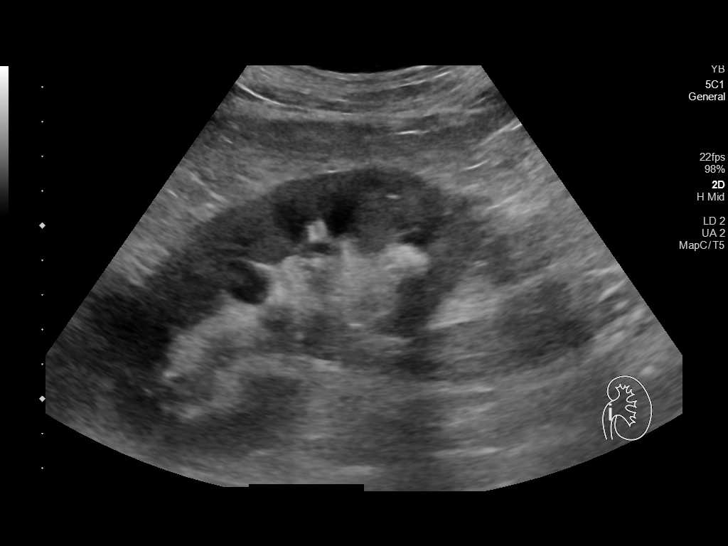
[im 103/148]
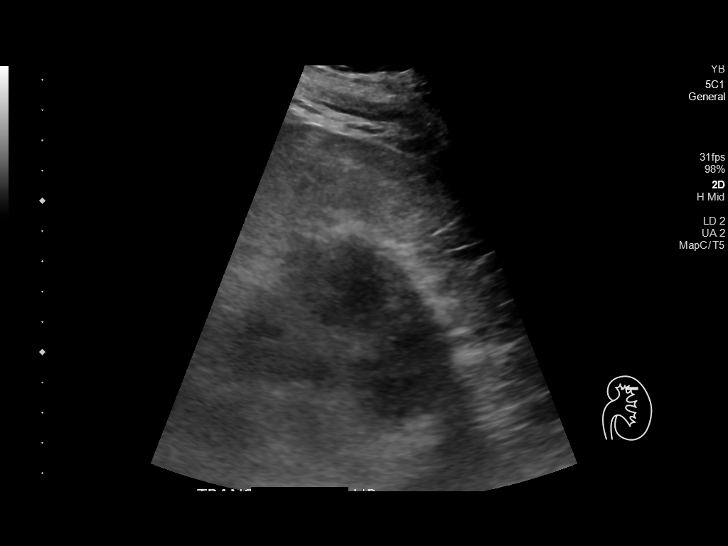
[im 116/148]
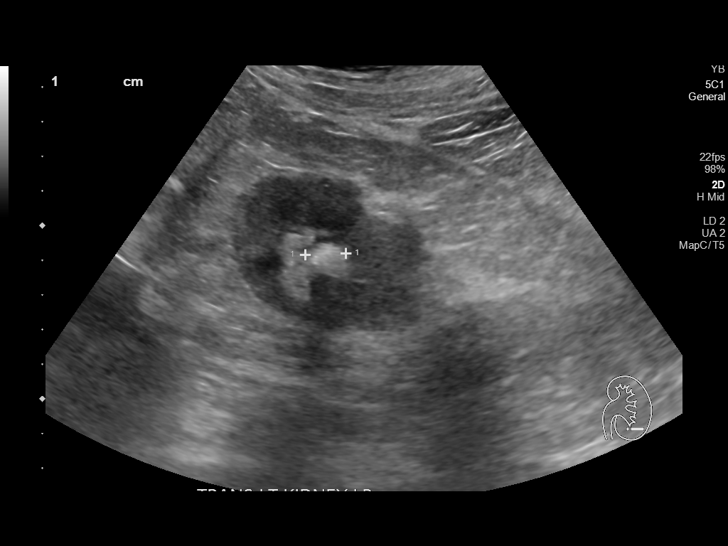
[im 128/148]
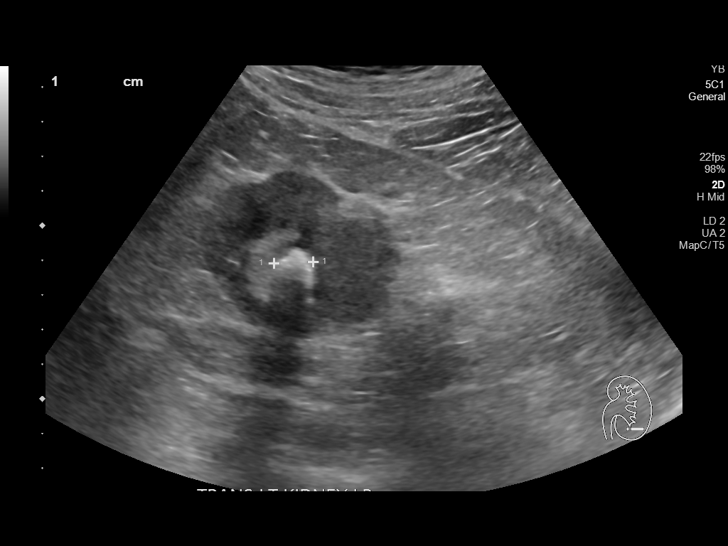
[im 141/148]
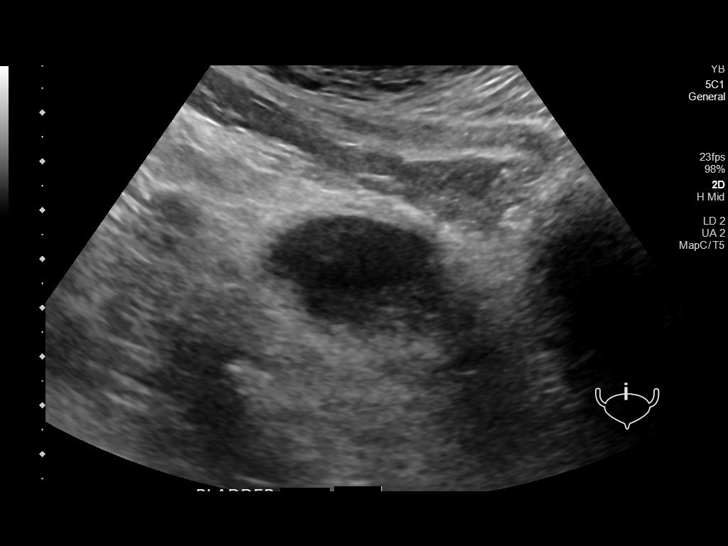

[Series 3: us renal · 1 of 7 slices shown (2 of 2)]
[im 1/7]
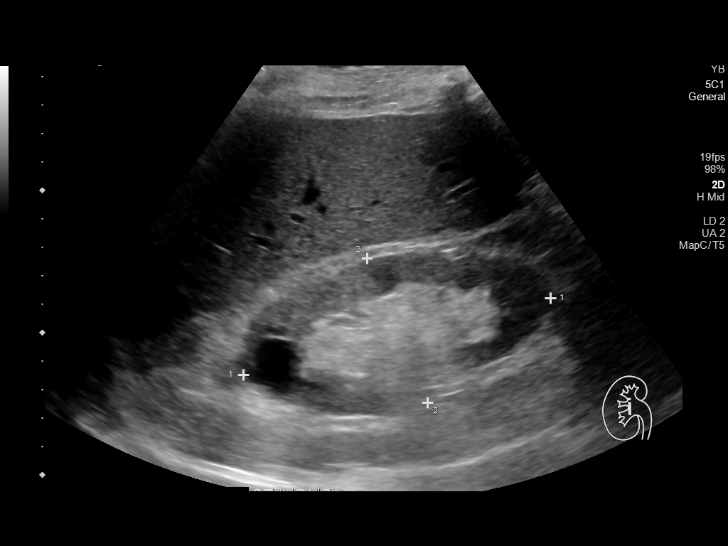

[13 of 25 positions shown; findings below may reference images not displayed]

FINDINGS: Right Kidney:

Renal measurements: 11.1 x 5.5 x 5.3 cm = volume: 168 mL. 2.2 cm
simple cyst in the upper right kidney. Small stone lower right
kidney on prior CT is not resolved sonographically. Echogenicity
within normal limits. No solid mass or hydronephrosis visualized.

Left Kidney:

Renal measurements: 12.4 x 6.6 x 5.4 cm = volume: 232 mL. Mild
hydronephrosis. 14 mm shadowing calculus in the lower left kidney.
The proximal ureteral calculi on prior CT are not definitively seen.
Parapelvic cyst measuring 19 mm. No solid mass.

Bladder:

Appears normal for degree of bladder distention.
IMPRESSION: 1. Mild left hydronephrosis, also seen on CT 3 days ago. The
previous proximal left ureteral calculi are not visualized
sonographically. Nonobstructing stone in the lower left kidney.
2. No right hydronephrosis. Nonobstructing right renal stone on CT
is not visualized.
3. Bilateral renal cysts.

## 2020-09-03 ENCOUNTER — Ambulatory Visit: Payer: Medicare PPO | Admitting: Endocrinology

## 2020-09-03 ENCOUNTER — Other Ambulatory Visit: Payer: Self-pay

## 2020-09-03 ENCOUNTER — Encounter: Payer: Self-pay | Admitting: Endocrinology

## 2020-09-03 VITALS — BP 128/76 | HR 77 | Ht 67.5 in | Wt 217.0 lb

## 2020-09-03 DIAGNOSIS — I10 Essential (primary) hypertension: Secondary | ICD-10-CM

## 2020-09-03 DIAGNOSIS — N183 Chronic kidney disease, stage 3 unspecified: Secondary | ICD-10-CM

## 2020-09-03 DIAGNOSIS — E1169 Type 2 diabetes mellitus with other specified complication: Secondary | ICD-10-CM

## 2020-09-03 DIAGNOSIS — E669 Obesity, unspecified: Secondary | ICD-10-CM | POA: Diagnosis not present

## 2020-09-03 NOTE — Patient Instructions (Signed)
Glimeperide 4mg : 1/2 pill daily at supper

## 2020-09-03 NOTE — Progress Notes (Signed)
Patient ID: Noah Barnes, male   DOB: [birth year], 75 y.o.   MRN: [id number]    Reason for Appointment : Follow-up visit  History of Present Illness          Diagnosis: Type 2 diabetes mellitus, date of diagnosis: 2008       Past history: His diabetes was borderline in the beginning and not clear what medications he was started on. Has been taking metformin for several years and this was later changed to [place]. Amaryl probably started in 5/14 when blood sugars were higher, initially was given 1 mg and now has been taking 2 mg. He had a relatively high A1c of 7.1, difficulty losing weight and a glucose of 202 on his initial consultation in 06/2013 He was then tried on Invokana in addition to his Janumet and Amaryl Invokana was reduced to 100 mg in early 2015 been creatinine had gone up to 1.6  Recent history:   Oral hypoglycemic drugs the patient is taking are: Amaryl 4 mg at supper, Rybelsus 14 mg daily   His A1c is significantly better at 6.1 now compared to 6.8, previous range 5.8-6.6 Fructosamine last 241  Current management, blood sugar patterns and problems identified:     He has been started on RYBELSUS 14 mg instead of 7 mg on his last visit  With this he has finally lost weight about 5 pounds, previously had difficulty gradually gaining weight  This is despite not exercising again  Likely has cut back on his portions and diet is overall better  Previously did not have a meter for download and now his blood sugars appear to be fairly consistent and less variable with a median of only 114  No hypoglycemia reported with lowest blood sugar 79 and generally below 100 in the morning  Previously blood sugars would be occasionally over 200  He is taking his Rybelsus daily on empty stomach 30-minute before eating  Renal function also better  No nausea with Rybelsus  Side effects from medications have been:  renal dysfunction from Invokana  Glucose  monitoring:  done <1 times a day         Glucometer:  Accu-Chek   Blood Glucose readings    PRE-MEAL Fasting Lunch Dinner Bedtime Overall  Glucose range:     79-172  Mean/median:  97  107   127    Previous data:  Fasting usually 120-130 After meals usually under 160, occasionally 170 or over 200    Glycemic control:   Lab Results  Component Value Date   HGBA1C 6.1 08/30/2020   HGBA1C 6.8 (H) 05/17/2020   HGBA1C 6.4 12/22/2019   Lab Results  Component Value Date   MICROALBUR 9.8 (H) 08/30/2020   LDLCALC 65 05/17/2020   CREATININE 1.47 08/30/2020    Self-care: Usually has low-fat diet, eggs in am with some carbohydrate, frequently no lunch     Dietician consultations: 08/2013              Weight history:   Wt Readings from Last 3 Encounters:  09/03/20 217 lb (98.4 kg)  07/06/20 222 lb (100.7 kg)  05/21/20 226 lb (102.5 kg)       Allergies as of 09/03/2020      Reactions   Ace Inhibitors Other (See Comments)   Severe AKI due to ARB + dehydration Aug 2016   Angiotensin Receptor Blockers Other (See Comments)   Severe AKI due to ARB + dehydration Aug 2016  Medication List       Accurate as of September 03, 2020  1:50 PM. If you have any questions, ask your nurse or doctor.        Accu-Chek Guide Me w/Device Kit 1 each by Does not apply route in the morning and at bedtime. Use Accu Chek Guide me device to check blood sugar twice daily.   Accu-Chek Guide test strip Generic drug: glucose blood TEST BLOOD SUGAR TWICE DAILY AS INSTRUCTED   Accu-Chek Softclix Lancets lancets CHECK BLOOD SUGAR TWICE DAILY AS DIRECTED   allopurinol 300 MG tablet Commonly known as: ZYLOPRIM Take 1 tablet by mouth once daily   apixaban 2.5 MG Tabs tablet Commonly known as: Eliquis Take 1 tablet (2.5 mg total) by mouth 2 (two) times daily.   atorvastatin 20 MG tablet Commonly known as: LIPITOR Take 1 tablet by mouth once daily   cefdinir 300 MG capsule Commonly known  as: OMNICEF Take 1 capsule (300 mg total) by mouth 2 (two) times daily.   Ferrex 150 150 MG capsule Generic drug: iron polysaccharides Take 1 capsule by mouth once daily   furosemide 40 MG tablet Commonly known as: LASIX Take 40 mg by mouth daily.   glimepiride 4 MG tablet Commonly known as: AMARYL TAKE 1 TABLET BY MOUTH ONCE DAILY BEFORE SUPPER   hydrALAZINE 50 MG tablet Commonly known as: APRESOLINE Take 1 tablet by mouth twice daily   Linzess 290 MCG Caps capsule Generic drug: linaclotide TAKE 1 CAPSULE BY MOUTH ONCE DAILY BEFORE BREAKFAST   metoprolol tartrate 100 MG tablet Commonly known as: LOPRESSOR Take 1 tablet by mouth twice daily   promethazine-dextromethorphan 6.25-15 MG/5ML syrup Commonly known as: PROMETHAZINE-DM Take 5 mLs by mouth 4 (four) times daily as needed for cough.   Rybelsus 14 MG Tabs Generic drug: Semaglutide Take 1 tablet 30 minutes before breakfast daily with a sip of water   sildenafil 100 MG tablet Commonly known as: VIAGRA TAKE 1 TABLET BY MOUTH EVERY DAY AS NEEDED FOR ERECTILE DYSFUNCTION   tamsulosin 0.4 MG Caps capsule Commonly known as: FLOMAX TAKE 1 CAPSULE BY MOUTH ONCE DAILY AFTER SUPPER       Allergies:  Allergies  Allergen Reactions  . Ace Inhibitors Other (See Comments)    Severe AKI due to ARB + dehydration Aug 2016  . Angiotensin Receptor Blockers Other (See Comments)    Severe AKI due to ARB + dehydration Aug 2016    Past Medical History:  Diagnosis Date  . CHF (congestive heart failure) ([place])   . Chronic diastolic heart failure ([place])   . Constipation   . Diabetes mellitus   . Diverticulitis   . Diverticulosis   . ED (erectile dysfunction)   . Gout   . Heart murmur   . Hyperlipidemia   . Hypertension   . IBS (irritable bowel syndrome)   . Renal calculus   . Renal insufficiency   . Tubular adenoma of colon     Past Surgical History:  Procedure Laterality Date  . COLONOSCOPY  1998   negative; no F/U   . EXTRACORPOREAL SHOCK WAVE LITHOTRIPSY Left 03/20/2019   Procedure: EXTRACORPOREAL SHOCK WAVE LITHOTRIPSY (ESWL);  Surgeon: Cleon Gustin, MD;  Location: [facility];  Service: Urology;  Laterality: Left;  . knee effusion tapped      Family History  Problem Relation Age of Onset  . Heart attack Mother 39  . Hypertension Mother   . Prostate cancer Father 62  . Diabetes Father   .  Irritable bowel syndrome Father   . Colon cancer Brother        dx in his late 15's  . Irritable bowel syndrome Sister        x 2  . Irritable bowel syndrome Brother   . Stroke Neg Hx   . Esophageal cancer Neg Hx   . Rectal cancer Neg Hx   . Stomach cancer Neg Hx     Social History:  reports that he quit smoking about 29 years ago. His smoking use included cigarettes. He smoked 0.30 packs per day. He has never used smokeless tobacco. He reports current alcohol use of about 2.0 standard drinks of alcohol per week. He reports that he does not use drugs.    Review of Systems   RENAL dysfunction: Creatinine is as follows, followed by nephrologist  Lab Results  Component Value Date   CREATININE 1.47 08/30/2020   CREATININE 1.72 (H) 05/17/2020   CREATININE 1.76 (H) 03/25/2020   CREATININE 1.76 (H) 02/24/2020     Lab Results  Component Value Date   K 3.7 08/30/2020          Lipids: Has been on Lipitor 20 mg for hyperlipidemia, baseline LDL about 162 Followed by Dr. Quay Burow  Lab Results  Component Value Date   CHOL 120 05/17/2020   HDL 42.80 05/17/2020   LDLCALC 65 05/17/2020   LDLDIRECT 80.1 04/15/2014   TRIG 62.0 05/17/2020   CHOLHDL 3 05/17/2020       The blood pressure has been high for several years and usually well controlled.  Currently taking, hydralazine, and metoprolol from his PCP  Blood pressure at home has been monitored regularly also   BP Readings from Last 3 Encounters:  09/03/20 128/76  07/06/20 116/78  05/21/20 110/80      LABS:  Lab on 08/30/2020   Component Date Value Ref Range Status  . Microalb, Ur 08/30/2020 9.8* 0.0 - 1.9 mg/dL Final  . Creatinine,U 08/30/2020 214.2  mg/dL Final  . Microalb Creat Ratio 08/30/2020 4.6  0.0 - 30.0 mg/g Final  . Sodium 08/30/2020 140  135 - 145 mEq/L Final  . Potassium 08/30/2020 3.7  3.5 - 5.1 mEq/L Final  . Chloride 08/30/2020 111  96 - 112 mEq/L Final  . CO2 08/30/2020 19  19 - 32 mEq/L Final  . Glucose, Bld 08/30/2020 116* 70 - 99 mg/dL Final  . BUN 08/30/2020 27* 6 - 23 mg/dL Final  . Creatinine, Ser 08/30/2020 1.47  0.40 - 1.50 mg/dL Final  . Total Bilirubin 08/30/2020 0.5  0.2 - 1.2 mg/dL Final  . Alkaline Phosphatase 08/30/2020 69  39 - 117 U/L Final  . AST 08/30/2020 19  0 - 37 U/L Final  . ALT 08/30/2020 44  0 - 53 U/L Final  . Total Protein 08/30/2020 6.5  6.0 - 8.3 g/dL Final  . Albumin 08/30/2020 3.7  3.5 - 5.2 g/dL Final  . GFR 08/30/2020 46.98* >60.00 mL/min Final   Calculated using the CKD-EPI Creatinine Equation (2021)  . Calcium 08/30/2020 9.1  8.4 - 10.5 mg/dL Final  . Hgb A1c MFr Bld 08/30/2020 6.1  4.6 - 6.5 % Final   Glycemic Control Guidelines for People with Diabetes:Non Diabetic:  <6%Goal of Therapy: <7%Additional Action Suggested:  >8%     Physical Examination:  BP 128/76   Pulse 77   Ht 5' 7.5" (1.715 m)   Wt 217 lb (98.4 kg)   SpO2 95%   BMI 33.49 kg/m  ASSESSMENT/PLAN:   Diabetes type 2, with obesity  See history of present illness for detailed discussion of current diabetes management, blood sugar patterns and problems identified  He is on Rybelsus 14 mg and Amaryl 4 mg  A1c is 6.1 compared to 6.8  Blood sugars are significantly better with the 40 mg Rybelsus which he is tolerating Also blood sugars are more stable than before especially postprandial Currently not exercising  Since he has had blood sugars as low as 79 we will reduce his Amaryl to 2 mg instead of 4 He will try to start walking and encouraged him to exercise regularly to  improve weight further  RENAL dysfunction: His creatinine is better than usual and not clear why Will forward results to his nephrologist  HYPERTENSION: Consistently controlled and he will also monitor regularly at home  Patient Instructions  Glimeperide 109m: 1/2 pill daily at supper     AElayne Snare3/10/2020, 1:50 PM

## 2020-09-07 ENCOUNTER — Telehealth: Payer: Self-pay | Admitting: Endocrinology

## 2020-09-07 NOTE — Telephone Encounter (Signed)
Pt requests his most recent labs to be sent over to his kidney Dr.  Attention Angie and Dr.Patel Fax# [phone]

## 2020-09-08 NOTE — Telephone Encounter (Signed)
-  Faxed recent lab noted to Dr. Mariane Masters.

## 2020-09-08 NOTE — Telephone Encounter (Signed)
Patient called to check status of sending his most recent labs to his kidney doctor - Dr Posey Pronto  Attention: Janace Hoard and Dr.Patel Fax# [phone]  Patient is now requesting a call to [phone] to confirm to him when these labs are sent to Dr Posey Pronto

## 2020-09-13 DIAGNOSIS — N1831 Chronic kidney disease, stage 3a: Secondary | ICD-10-CM | POA: Diagnosis not present

## 2020-09-14 ENCOUNTER — Telehealth: Payer: Self-pay | Admitting: Internal Medicine

## 2020-09-14 DIAGNOSIS — N1831 Chronic kidney disease, stage 3a: Secondary | ICD-10-CM | POA: Diagnosis not present

## 2020-09-14 NOTE — Telephone Encounter (Signed)
Patient has called in regards to his bronchitis coming back. He said he was prescribed an antibiotic for it before but could not remember the name of it, but he does want that again

## 2020-09-14 NOTE — Progress Notes (Signed)
Subjective:    Patient ID: Noah Barnes, male    DOB: 07-29-1945, 75 y.o.   MRN: [id number]  HPI  He is here for an acute visit for cold symptoms.  His symptoms started 4-5 days ago.  He is experiencing tightness in chest and he can feel it in his lungs. He is coughing a little, but bring very little up.    He has tried taking mucinex   He took a covid test and it was negative.   He was seen virtually on 1/26 and dx with acute bronchitis.  I rx'd omnicef and promethazine-dm syrup.  This worked well.    Sugar was 100 this morning and a1c was well controlled week.   Medications and allergies reviewed with patient and updated if appropriate.  Patient Active Problem List   Diagnosis Date Noted  . Acute bronchitis 07/28/2020  . BPH (benign prostatic hyperplasia) 07/05/2020  . Left lower quadrant abdominal pain 11/28/2019  . Abnormal findings on diagnostic imaging of lung 04/04/2019  . History of DVT of lower extremity, b/l 04/03/2019  . History of pulmonary embolus (PE) 04/03/2019  . Hypomagnesemia 04/03/2019  . HCAP (healthcare-associated pneumonia) 03/24/2019  . Nephrolithiasis 03/06/2019  . Seasonal allergic rhinitis 05/22/2018  . MR (mitral regurgitation) 12/20/2017  . CKD (chronic kidney disease) stage 3, GFR 30-59 ml/min (HCC) 08/14/2017  . Abdominal hernia without obstruction and without gangrene 01/10/2016  . Chronic diastolic heart failure ([place]) 02/25/2015  . Right renal mass 02/25/2015  . Other constipation 02/22/2015  . Chronic venous insufficiency 01/18/2015  . Superficial phlebitis 01/18/2015  . IBS (irritable bowel syndrome) 11/12/2013  . Family history of prostate cancer 12/26/2011  . Diabetes mellitus with renal complications ([place]) 43/88/8757  . Gout 12/31/2007  . HYPERLIPIDEMIA 08/14/2007  . ERECTILE DYSFUNCTION 05/09/2007  . Essential hypertension 11/28/2006    Current Outpatient Medications on File Prior to Visit  Medication Sig Dispense  Refill  . Accu-Chek Softclix Lancets lancets CHECK BLOOD SUGAR TWICE DAILY AS DIRECTED 100 each 2  . allopurinol (ZYLOPRIM) 300 MG tablet Take 1 tablet by mouth once daily 90 tablet 2  . apixaban (ELIQUIS) 2.5 MG TABS tablet Take 1 tablet (2.5 mg total) by mouth 2 (two) times daily. 60 tablet 3  . atorvastatin (LIPITOR) 20 MG tablet Take 1 tablet by mouth once daily 90 tablet 0  . Blood Glucose Monitoring Suppl (ACCU-CHEK GUIDE ME) w/Device KIT 1 each by Does not apply route in the morning and at bedtime. Use Accu Chek Guide me device to check blood sugar twice daily. 1 kit 0  . cefdinir (OMNICEF) 300 MG capsule Take 1 capsule (300 mg total) by mouth 2 (two) times daily. 20 capsule 0  . FERREX 150 150 MG capsule Take 1 capsule by mouth once daily 90 capsule 0  . furosemide (LASIX) 40 MG tablet Take 40 mg by mouth daily.    [name] glimepiride (AMARYL) 4 MG tablet TAKE 1 TABLET BY MOUTH ONCE DAILY BEFORE SUPPER 90 tablet 1  . glucose blood (ACCU-CHEK GUIDE) test strip TEST BLOOD SUGAR TWICE DAILY AS INSTRUCTED 100 strip 3  . hydrALAZINE (APRESOLINE) 50 MG tablet Take 1 tablet by mouth twice daily 180 tablet 0  . LINZESS 290 MCG CAPS capsule TAKE 1 CAPSULE BY MOUTH ONCE DAILY BEFORE BREAKFAST 30 capsule 4  . promethazine-dextromethorphan (PROMETHAZINE-DM) 6.25-15 MG/5ML syrup Take 5 mLs by mouth 4 (four) times daily as needed for cough. 150 mL 0  . Semaglutide (RYBELSUS) 14  MG TABS Take 1 tablet 30 minutes before breakfast daily with a sip of water 30 tablet 3  . sildenafil (VIAGRA) 100 MG tablet TAKE 1 TABLET BY MOUTH EVERY DAY AS NEEDED FOR ERECTILE DYSFUNCTION 6 tablet 1  . tamsulosin (FLOMAX) 0.4 MG CAPS capsule TAKE 1 CAPSULE BY MOUTH ONCE DAILY AFTER SUPPER 90 capsule 0   No current facility-administered medications on file prior to visit.    Past Medical History:  Diagnosis Date  . CHF (congestive heart failure) ([place])   . Chronic diastolic heart failure ([place])   . Constipation   . Diabetes  mellitus   . Diverticulitis   . Diverticulosis   . ED (erectile dysfunction)   . Gout   . Heart murmur   . Hyperlipidemia   . Hypertension   . IBS (irritable bowel syndrome)   . Renal calculus   . Renal insufficiency   . Tubular adenoma of colon     Past Surgical History:  Procedure Laterality Date  . COLONOSCOPY  1998   negative; no F/U  . EXTRACORPOREAL SHOCK WAVE LITHOTRIPSY Left 03/20/2019   Procedure: EXTRACORPOREAL SHOCK WAVE LITHOTRIPSY (ESWL);  Surgeon: Cleon Gustin, MD;  Location: [facility];  Service: Urology;  Laterality: Left;  . knee effusion tapped      Social History   Socioeconomic History  . Marital status: Married    Spouse name: Not on file  . Number of children: 1  . Years of education: Not on file  . Highest education level: Not on file  Occupational History  . Occupation: Metallurgist: [employer]    Comment: [phone]  Tobacco Use  . Smoking status: Former Smoker    Packs/day: 0.30    Types: Cigarettes    Quit date: 07/04/1991    Years since quitting: 29.2  . Smokeless tobacco: Never Used  . Tobacco comment: smoked age [age], up to 1/3 ppd; may be less  Vaping Use  . Vaping Use: Never used  Substance and Sexual Activity  . Alcohol use: Yes    Alcohol/week: 2.0 standard drinks    Types: 2 Glasses of wine per week    Comment: rare  . Drug use: No  . Sexual activity: Yes  Other Topics Concern  . Not on file  Social History Narrative  . Not on file   Social Determinants of Health   Financial Resource Strain: Not on file  Food Insecurity: Not on file  Transportation Needs: Not on file  Physical Activity: Not on file  Stress: Not on file  Social Connections: Not on file    Family History  Problem Relation Age of Onset  . Heart attack Mother 54  . Hypertension Mother   . Prostate cancer Father 57  . Diabetes Father   . Irritable bowel syndrome Father   . Colon cancer Brother        dx in his  late 53's  . Irritable bowel syndrome Sister        x 2  . Irritable bowel syndrome Brother   . Stroke Neg Hx   . Esophageal cancer Neg Hx   . Rectal cancer Neg Hx   . Stomach cancer Neg Hx     Review of Systems  Constitutional: Negative for appetite change, chills and fever.  HENT: Positive for congestion. Negative for ear pain, sinus pressure, sinus pain and sore throat.   Respiratory: Positive for cough, chest tightness and shortness of  breath. Negative for wheezing.   Cardiovascular: Negative for chest pain.  Neurological: Positive for light-headedness. Negative for dizziness and headaches.       Objective:   Vitals:   09/15/20 1257  BP: (!) 142/72  Pulse: 69  Temp: 99.1 F (37.3 C)  SpO2: 96%   BP Readings from Last 3 Encounters:  09/15/20 (!) 142/72  09/03/20 128/76  07/06/20 116/78   Wt Readings from Last 3 Encounters:  09/15/20 219 lb (99.3 kg)  09/03/20 217 lb (98.4 kg)  07/06/20 222 lb (100.7 kg)   Body mass index is 33.79 kg/m.   Physical Exam    GENERAL APPEARANCE: Appears stated age, well appearing, NAD EYES: conjunctiva clear, no icterus HEENT: bilateral tympanic membranes and ear canals normal, oropharynx with no erythema, no thyromegaly, trachea midline, no cervical or supraclavicular lymphadenopathy LUNGS: Clear to auscultation without wheeze or crackles, unlabored breathing, good air entry bilaterally CARDIOVASCULAR: Normal S1,S2 without murmurs, no edema SKIN: Warm, dry     Assessment & Plan:    See Problem List for Assessment and Plan of chronic medical problems.    This visit occurred during the SARS-CoV-2 public health emergency.  Safety protocols were in place, including screening questions prior to the visit, additional usage of staff PPE, and extensive cleaning of exam room while observing appropriate contact time as indicated for disinfecting solutions.

## 2020-09-14 NOTE — Telephone Encounter (Signed)
Patient scheduled for in office visit tomorrow.

## 2020-09-14 NOTE — Telephone Encounter (Signed)
He needs to be re-evaluated - we can not just prescribe antibiotics when someone thinks they need them

## 2020-09-15 ENCOUNTER — Other Ambulatory Visit: Payer: Self-pay

## 2020-09-15 ENCOUNTER — Ambulatory Visit: Payer: Medicare PPO | Admitting: Internal Medicine

## 2020-09-15 ENCOUNTER — Encounter: Payer: Self-pay | Admitting: Internal Medicine

## 2020-09-15 DIAGNOSIS — J209 Acute bronchitis, unspecified: Secondary | ICD-10-CM | POA: Diagnosis not present

## 2020-09-15 MED ORDER — METOPROLOL TARTRATE 100 MG PO TABS: 100.0000 mg | ORAL_TABLET | Freq: Two times a day (BID) | ORAL | 3 refills | Status: DC

## 2020-09-15 MED ORDER — CEFDINIR 300 MG PO CAPS: 300.0000 mg | ORAL_CAPSULE | Freq: Two times a day (BID) | ORAL | 0 refills | Status: DC

## 2020-09-15 NOTE — Patient Instructions (Addendum)
  Start the antibiotic, omnicef as prescribed.    Take over the counter cold medications for symptom relief.   Increase your rest and fluids.   Call if no improvement

## 2020-09-15 NOTE — Assessment & Plan Note (Addendum)
Acute significant history of bronchiitis Likely bacterial  PNA unlikely so will hold off on cxr Start Onicef 300 mg BID x 10 day otc cold medications Rest, fluid Call if no improvement

## 2020-09-21 ENCOUNTER — Other Ambulatory Visit: Payer: Self-pay | Admitting: Internal Medicine

## 2020-09-22 ENCOUNTER — Inpatient Hospital Stay (HOSPITAL_BASED_OUTPATIENT_CLINIC_OR_DEPARTMENT_OTHER): Payer: Medicare PPO | Admitting: Family

## 2020-09-22 ENCOUNTER — Other Ambulatory Visit: Payer: Self-pay

## 2020-09-22 ENCOUNTER — Inpatient Hospital Stay: Payer: Medicare PPO | Attending: Hematology & Oncology

## 2020-09-22 DIAGNOSIS — Z86718 Personal history of other venous thrombosis and embolism: Secondary | ICD-10-CM | POA: Diagnosis not present

## 2020-09-22 DIAGNOSIS — R609 Edema, unspecified: Secondary | ICD-10-CM | POA: Insufficient documentation

## 2020-09-22 DIAGNOSIS — K589 Irritable bowel syndrome without diarrhea: Secondary | ICD-10-CM | POA: Diagnosis not present

## 2020-09-22 DIAGNOSIS — Z86711 Personal history of pulmonary embolism: Secondary | ICD-10-CM | POA: Diagnosis not present

## 2020-09-22 DIAGNOSIS — Z7901 Long term (current) use of anticoagulants: Secondary | ICD-10-CM | POA: Diagnosis not present

## 2020-09-22 LAB — CBC WITH DIFFERENTIAL (CANCER CENTER ONLY)
Abs Immature Granulocytes: 0.01 10*3/uL (ref 0.00–0.07)
Basophils Absolute: 0 10*3/uL (ref 0.0–0.1)
Basophils Relative: 1 %
Eosinophils Absolute: 0.1 10*3/uL (ref 0.0–0.5)
Eosinophils Relative: 2 %
HCT: 41.6 % (ref 39.0–52.0)
Hemoglobin: 13.1 g/dL (ref 13.0–17.0)
Immature Granulocytes: 0 %
Lymphocytes Relative: 27 %
Lymphs Abs: 1.1 10*3/uL (ref 0.7–4.0)
MCH: 26.8 pg (ref 26.0–34.0)
MCHC: 31.5 g/dL (ref 30.0–36.0)
MCV: 85.2 fL (ref 80.0–100.0)
Monocytes Absolute: 0.4 10*3/uL (ref 0.1–1.0)
Monocytes Relative: 10 %
Neutro Abs: 2.5 10*3/uL (ref 1.7–7.7)
Neutrophils Relative %: 60 %
Platelet Count: 228 10*3/uL (ref 150–400)
RBC: 4.88 MIL/uL (ref 4.22–5.81)
RDW: 14.3 % (ref 11.5–15.5)
WBC Count: 4.1 10*3/uL (ref 4.0–10.5)
nRBC: 0 % (ref 0.0–0.2)

## 2020-09-22 LAB — CMP (CANCER CENTER ONLY)
ALT: 51 U/L — ABNORMAL HIGH (ref 0–44)
AST: 20 U/L (ref 15–41)
Albumin: 4.2 g/dL (ref 3.5–5.0)
Alkaline Phosphatase: 73 U/L (ref 38–126)
Anion gap: 7 (ref 5–15)
BUN: 32 mg/dL — ABNORMAL HIGH (ref 8–23)
CO2: 21 mmol/L — ABNORMAL LOW (ref 22–32)
Calcium: 9.6 mg/dL (ref 8.9–10.3)
Chloride: 110 mmol/L (ref 98–111)
Creatinine: 1.56 mg/dL — ABNORMAL HIGH (ref 0.61–1.24)
GFR, Estimated: 47 mL/min — ABNORMAL LOW (ref >60)
Glucose, Bld: 85 mg/dL (ref 70–99)
Potassium: 4.3 mmol/L (ref 3.5–5.1)
Sodium: 138 mmol/L (ref 135–145)
Total Bilirubin: 0.6 mg/dL (ref 0.3–1.2)
Total Protein: 7 g/dL (ref 6.5–8.1)

## 2020-09-22 NOTE — Progress Notes (Signed)
Hematology and Oncology Follow Up Visit  Noah Barnes [id number] 04/12/1946 75 y.o. 09/22/2020   Principle Diagnosis:  Bilateral lower extremity DVT/pulmonary embolism - 03/23/2019  Current Therapy:        Eliquis 2.5 mg p.o. twice daily as long-term maintenance   Interim History:  Noah Barnes is here today for follow-up. He is doing quite well and has no complaints at this time.  He continues to tolerate Eliquis nicely. He denies any bleeding issues. No bruising or petechiae.  No fever, chills, n/v, cough, rash, dizziness, SOB, chest pain, palpitations, abdominal pain or changes in bowel or bladder habits.  He has IBS-C and takes Linzess daily.  No tenderness, numbness or tingling in his extremities.  He has intermittent puffiness in his feet and ankles. He takes Lasix daily which helps reduce fluid retention.  He is on Rybelsus which curbs his appetite. His most recent Hgb A1c was stable at 6.1.  He is staying well hydrated throughout the day and his weight is stable at 220 lbs.  He is enjoying retirement and spending time with his sweet granddaughter.   ECOG Performance Status: 1 - Symptomatic but completely ambulatory  Medications:  Allergies as of 09/22/2020      Reactions   Ace Inhibitors Other (See Comments)   Severe AKI due to ARB + dehydration Aug 2016   Angiotensin Receptor Blockers Other (See Comments)   Severe AKI due to ARB + dehydration Aug 2016      Medication List       Accurate as of September 22, 2020 12:19 PM. If you have any questions, ask your nurse or doctor.        Accu-Chek Guide Me w/Device Kit 1 each by Does not apply route in the morning and at bedtime. Use Accu Chek Guide me device to check blood sugar twice daily.   Accu-Chek Guide test strip Generic drug: glucose blood TEST BLOOD SUGAR TWICE DAILY AS INSTRUCTED   Accu-Chek Softclix Lancets lancets CHECK BLOOD SUGAR TWICE DAILY AS DIRECTED   allopurinol 300 MG tablet Commonly known as:  ZYLOPRIM Take 1 tablet by mouth once daily   apixaban 2.5 MG Tabs tablet Commonly known as: Eliquis Take 1 tablet (2.5 mg total) by mouth 2 (two) times daily.   atorvastatin 20 MG tablet Commonly known as: LIPITOR Take 1 tablet by mouth once daily   cefdinir 300 MG capsule Commonly known as: OMNICEF Take 1 capsule (300 mg total) by mouth 2 (two) times daily.   Ferrex 150 150 MG capsule Generic drug: iron polysaccharides Take 1 capsule by mouth once daily   furosemide 40 MG tablet Commonly known as: LASIX Take 40 mg by mouth daily.   glimepiride 4 MG tablet Commonly known as: AMARYL TAKE 1 TABLET BY MOUTH ONCE DAILY BEFORE SUPPER   hydrALAZINE 50 MG tablet Commonly known as: APRESOLINE Take 1 tablet by mouth twice daily   Linzess 290 MCG Caps capsule Generic drug: linaclotide TAKE 1 CAPSULE BY MOUTH ONCE DAILY BEFORE BREAKFAST   metoprolol tartrate 100 MG tablet Commonly known as: LOPRESSOR Take 1 tablet (100 mg total) by mouth 2 (two) times daily.   promethazine-dextromethorphan 6.25-15 MG/5ML syrup Commonly known as: PROMETHAZINE-DM Take 5 mLs by mouth 4 (four) times daily as needed for cough.   Rybelsus 14 MG Tabs Generic drug: Semaglutide Take 1 tablet 30 minutes before breakfast daily with a sip of water   sildenafil 100 MG tablet Commonly known as: VIAGRA TAKE 1 TABLET BY  MOUTH EVERY DAY AS NEEDED FOR ERECTILE DYSFUNCTION   tamsulosin 0.4 MG Caps capsule Commonly known as: FLOMAX TAKE 1 CAPSULE BY MOUTH ONCE DAILY AFTER SUPPER       Allergies:  Allergies  Allergen Reactions  . Ace Inhibitors Other (See Comments)    Severe AKI due to ARB + dehydration Aug 2016  . Angiotensin Receptor Blockers Other (See Comments)    Severe AKI due to ARB + dehydration Aug 2016    Past Medical History, Surgical history, Social history, and Family History were reviewed and updated.  Review of Systems: All other 10 point review of systems is negative.    Physical Exam:  vitals were not taken for this visit.   Wt Readings from Last 3 Encounters:  09/15/20 219 lb (99.3 kg)  09/03/20 217 lb (98.4 kg)  07/06/20 222 lb (100.7 kg)    Ocular: Sclerae unicteric, pupils equal, round and reactive to light Ear-nose-throat: Oropharynx clear, dentition fair Lymphatic: No cervical or supraclavicular adenopathy Lungs no rales or rhonchi, good excursion bilaterally Heart regular rate and rhythm, no murmur appreciated Abd soft, nontender, positive bowel sounds MSK no focal spinal tenderness, no joint edema Neuro: non-focal, well-oriented, appropriate affect Breasts: Deferred   Lab Results  Component Value Date   WBC 4.1 09/22/2020   HGB 13.1 09/22/2020   HCT 41.6 09/22/2020   MCV 85.2 09/22/2020   PLT 228 09/22/2020   Lab Results  Component Value Date   FERRITIN 211 03/28/2019   IRON 29 (L) 03/28/2019   TIBC 216 (L) 03/28/2019   UIBC 187 03/28/2019   IRONPCTSAT 13 (L) 03/28/2019   Lab Results  Component Value Date   RETICCTPCT 1.2 03/28/2019   RBC 4.88 09/22/2020   No results found for: KPAFRELGTCHN, LAMBDASER, KAPLAMBRATIO No results found for: IGGSERUM, IGA, IGMSERUM No results found for: Ronnald Ramp, A1GS, Gilford Silvius, MSPIKE, SPEI   Chemistry      Component Value Date/Time   NA 138 09/22/2020 1131   K 4.3 09/22/2020 1131   CL 110 09/22/2020 1131   CO2 21 (L) 09/22/2020 1131   BUN 32 (H) 09/22/2020 1131   CREATININE 1.56 (H) 09/22/2020 1131      Component Value Date/Time   CALCIUM 9.6 09/22/2020 1131   ALKPHOS 73 09/22/2020 1131   AST 20 09/22/2020 1131   ALT 51 (H) 09/22/2020 1131   BILITOT 0.6 09/22/2020 1131       Impression and Plan: Noah Barnes is a very pleasant 75 yo African American gentleman with 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 He completed 6 months of full anticoagulation and is now doing well on maintenance low dose Eliquis lifelong.  So far there has been no  evidence of recurrence. He will continue on his same regimen with Eliquis.  Follow-up in 6 months.  He can contact our office with any questions or concerns.   Laverna Peace, NP 3/23/202212:19 PM

## 2020-09-23 DIAGNOSIS — N1831 Chronic kidney disease, stage 3a: Secondary | ICD-10-CM | POA: Diagnosis not present

## 2020-09-23 DIAGNOSIS — N2581 Secondary hyperparathyroidism of renal origin: Secondary | ICD-10-CM | POA: Diagnosis not present

## 2020-09-23 DIAGNOSIS — I129 Hypertensive chronic kidney disease with stage 1 through stage 4 chronic kidney disease, or unspecified chronic kidney disease: Secondary | ICD-10-CM | POA: Diagnosis not present

## 2020-09-23 DIAGNOSIS — E1122 Type 2 diabetes mellitus with diabetic chronic kidney disease: Secondary | ICD-10-CM | POA: Diagnosis not present

## 2020-09-23 DIAGNOSIS — D631 Anemia in chronic kidney disease: Secondary | ICD-10-CM | POA: Diagnosis not present

## 2020-09-25 ENCOUNTER — Other Ambulatory Visit: Payer: Self-pay | Admitting: Endocrinology

## 2020-10-04 ENCOUNTER — Other Ambulatory Visit: Payer: Self-pay | Admitting: Internal Medicine

## 2020-10-08 ENCOUNTER — Other Ambulatory Visit: Payer: Self-pay | Admitting: *Deleted

## 2020-10-08 MED ORDER — APIXABAN 2.5 MG PO TABS: 2.5000 mg | ORAL_TABLET | Freq: Two times a day (BID) | ORAL | 3 refills | Status: DC

## 2020-10-13 ENCOUNTER — Other Ambulatory Visit: Payer: Self-pay | Admitting: Internal Medicine

## 2020-10-14 ENCOUNTER — Other Ambulatory Visit: Payer: Self-pay | Admitting: *Deleted

## 2020-10-14 MED ORDER — APIXABAN 2.5 MG PO TABS: 2.5000 mg | ORAL_TABLET | Freq: Two times a day (BID) | ORAL | 3 refills | Status: DC

## 2020-10-20 ENCOUNTER — Telehealth: Payer: Self-pay | Admitting: Internal Medicine

## 2020-10-20 ENCOUNTER — Other Ambulatory Visit: Payer: Self-pay

## 2020-10-20 ENCOUNTER — Ambulatory Visit: Payer: Medicare PPO | Admitting: Internal Medicine

## 2020-10-20 ENCOUNTER — Ambulatory Visit (INDEPENDENT_AMBULATORY_CARE_PROVIDER_SITE_OTHER): Payer: Medicare PPO

## 2020-10-20 ENCOUNTER — Encounter: Payer: Self-pay | Admitting: Internal Medicine

## 2020-10-20 VITALS — BP 138/70 | HR 75 | Temp 98.6°F | Ht 67.5 in | Wt 220.2 lb

## 2020-10-20 DIAGNOSIS — I10 Essential (primary) hypertension: Secondary | ICD-10-CM

## 2020-10-20 DIAGNOSIS — J45909 Unspecified asthma, uncomplicated: Secondary | ICD-10-CM | POA: Insufficient documentation

## 2020-10-20 DIAGNOSIS — R0789 Other chest pain: Secondary | ICD-10-CM

## 2020-10-20 DIAGNOSIS — J4521 Mild intermittent asthma with (acute) exacerbation: Secondary | ICD-10-CM | POA: Diagnosis not present

## 2020-10-20 DIAGNOSIS — E782 Mixed hyperlipidemia: Secondary | ICD-10-CM

## 2020-10-20 DIAGNOSIS — N183 Chronic kidney disease, stage 3 unspecified: Secondary | ICD-10-CM

## 2020-10-20 DIAGNOSIS — E1129 Type 2 diabetes mellitus with other diabetic kidney complication: Secondary | ICD-10-CM

## 2020-10-20 DIAGNOSIS — R059 Cough, unspecified: Secondary | ICD-10-CM | POA: Diagnosis not present

## 2020-10-20 MED ORDER — FLUTICASONE-SALMETEROL 100-50 MCG/DOSE IN AEPB
1.0000 | INHALATION_SPRAY | Freq: Two times a day (BID) | RESPIRATORY_TRACT | 3 refills | Status: DC | PRN
Start: 2020-10-20 — End: 2022-01-31

## 2020-10-20 NOTE — Patient Instructions (Addendum)
Have a chest xray downstairs.   Try the inhaler - this was sent to your pharmacy.   Continue the mucinex.     Please call if there is no improvement in your symptoms.

## 2020-10-20 NOTE — Telephone Encounter (Signed)
Patient coming in today  

## 2020-10-20 NOTE — Assessment & Plan Note (Signed)
Acute I do not think he has an active bacterial infection He may have a mild viral infection or some seasonal allergies, but both of the symptoms seem to be related to reactive airway disease-this could be a result of some of his upper respiratory infections over the years We will hold off on an antibiotic unless symptoms worsen Trial of an inhaler help and will see if Advair 100-50 mcg per dose is covered by insurance-1 puff twice daily as needed for chest tightness Continue Mucinex Chest x-ray today He will let me know if his symptoms improve/worsen

## 2020-10-20 NOTE — Progress Notes (Signed)
Subjective:    Patient ID: Noah Barnes, male    DOB: Mar 06, 1946, 75 y.o.   MRN: [id number]  HPI The patient is here for an acute visit.   Saw him 3/16 for cold symptoms - had bronchitis and treateed with omnicef and promethazine - dm syrup ( same as in January which worked well).  He thinks his symptoms cleared up, but is not sure if there was a little bit of residual infection still there.  He was not symptomatic.  His symptoms started the past 3-4 days.  He does feel better than yesterday.  He has been taking Mucinex and that tends to help.   He often gets this problem in the colder weather - he does not have this problem in the summer.  It has gotten worse as he has gotten older.  He states Nasal congestion at night only.  He has a cough that is minimally productive of sputum.  He states chest tightness on the right side of his chest and very mild shortness of breath.  He denies any fevers, chills, sinus pain, wheezing and headaches.   Medications and allergies reviewed with patient and updated if appropriate.  Patient Active Problem List   Diagnosis Date Noted  . Acute bronchitis 07/28/2020  . BPH (benign prostatic hyperplasia) 07/05/2020  . Left lower quadrant abdominal pain 11/28/2019  . Abnormal findings on diagnostic imaging of lung 04/04/2019  . History of DVT of lower extremity, b/l 04/03/2019  . History of pulmonary embolus (PE) 04/03/2019  . Hypomagnesemia 04/03/2019  . HCAP (healthcare-associated pneumonia) 03/24/2019  . Nephrolithiasis 03/06/2019  . Seasonal allergic rhinitis 05/22/2018  . MR (mitral regurgitation) 12/20/2017  . CKD (chronic kidney disease) stage 3, GFR 30-59 ml/min (HCC) 08/14/2017  . Abdominal hernia without obstruction and without gangrene 01/10/2016  . Chronic diastolic heart failure ([place]) 02/25/2015  . Right renal mass 02/25/2015  . Other constipation 02/22/2015  . Chronic venous insufficiency 01/18/2015  . Superficial phlebitis  01/18/2015  . IBS (irritable bowel syndrome) 11/12/2013  . Family history of prostate cancer 12/26/2011  . Diabetes mellitus with renal complications ([place]) 03/47/4259  . Gout 12/31/2007  . HYPERLIPIDEMIA 08/14/2007  . ERECTILE DYSFUNCTION 05/09/2007  . Essential hypertension 11/28/2006    Current Outpatient Medications on File Prior to Visit  Medication Sig Dispense Refill  . Accu-Chek Softclix Lancets lancets CHECK BLOOD SUGAR TWICE DAILY AS DIRECTED 100 each 2  . allopurinol (ZYLOPRIM) 300 MG tablet Take 1 tablet by mouth once daily 90 tablet 2  . apixaban (ELIQUIS) 2.5 MG TABS tablet Take 1 tablet (2.5 mg total) by mouth 2 (two) times daily. 180 tablet 3  . atorvastatin (LIPITOR) 20 MG tablet Take 1 tablet by mouth once daily 90 tablet 0  . Blood Glucose Monitoring Suppl (ACCU-CHEK GUIDE ME) w/Device KIT 1 each by Does not apply route in the morning and at bedtime. Use Accu Chek Guide me device to check blood sugar twice daily. 1 kit 0  . cefdinir (OMNICEF) 300 MG capsule Take 1 capsule (300 mg total) by mouth 2 (two) times daily. 20 capsule 0  . FERREX 150 150 MG capsule Take 1 capsule by mouth once daily 90 capsule 0  . furosemide (LASIX) 40 MG tablet Take 40 mg by mouth daily.    [name] glimepiride (AMARYL) 4 MG tablet TAKE 1 TABLET BY MOUTH ONCE DAILY BEFORE SUPPER 90 tablet 1  . glucose blood (ACCU-CHEK GUIDE) test strip TEST BLOOD SUGAR TWICE DAILY AS  INSTRUCTED 100 strip 3  . hydrALAZINE (APRESOLINE) 50 MG tablet Take 1 tablet by mouth twice daily 180 tablet 0  . LINZESS 290 MCG CAPS capsule TAKE 1 CAPSULE BY MOUTH ONCE DAILY BEFORE BREAKFAST 30 capsule 4  . metoprolol tartrate (LOPRESSOR) 100 MG tablet Take 1 tablet (100 mg total) by mouth 2 (two) times daily. 180 tablet 3  . promethazine-dextromethorphan (PROMETHAZINE-DM) 6.25-15 MG/5ML syrup Take 5 mLs by mouth 4 (four) times daily as needed for cough. 150 mL 0  . Semaglutide (RYBELSUS) 14 MG TABS TAKE 1 TABLET BY MOUTH ONCE DAILY  30  MINUTES  BEFORE  BREAKFAST  WITH  A  SIP  OF  WATER 30 tablet 5  . sildenafil (VIAGRA) 100 MG tablet TAKE 1 TABLET BY MOUTH EVERY DAY AS NEEDED FOR ERECTILE DYSFUNCTION 6 tablet 1  . tamsulosin (FLOMAX) 0.4 MG CAPS capsule TAKE 1 CAPSULE BY MOUTH ONCE DAILY AFTER SUPPER 90 capsule 0   No current facility-administered medications on file prior to visit.    Past Medical History:  Diagnosis Date  . CHF (congestive heart failure) ([place])   . Chronic diastolic heart failure ([place])   . Constipation   . Diabetes mellitus   . Diverticulitis   . Diverticulosis   . ED (erectile dysfunction)   . Gout   . Heart murmur   . Hyperlipidemia   . Hypertension   . IBS (irritable bowel syndrome)   . Renal calculus   . Renal insufficiency   . Tubular adenoma of colon     Past Surgical History:  Procedure Laterality Date  . COLONOSCOPY  1998   negative; no F/U  . EXTRACORPOREAL SHOCK WAVE LITHOTRIPSY Left 03/20/2019   Procedure: EXTRACORPOREAL SHOCK WAVE LITHOTRIPSY (ESWL);  Surgeon: Cleon Gustin, MD;  Location: [facility];  Service: Urology;  Laterality: Left;  . knee effusion tapped      Social History   Socioeconomic History  . Marital status: Married    Spouse name: Not on file  . Number of children: 1  . Years of education: Not on file  . Highest education level: Not on file  Occupational History  . Occupation: Metallurgist: [employer]    Comment: [phone]  Tobacco Use  . Smoking status: Former Smoker    Packs/day: 0.30    Types: Cigarettes    Quit date: 07/04/1991    Years since quitting: 29.3  . Smokeless tobacco: Never Used  . Tobacco comment: smoked age [age], up to 1/3 ppd; may be less  Vaping Use  . Vaping Use: Never used  Substance and Sexual Activity  . Alcohol use: Yes    Alcohol/week: 2.0 standard drinks    Types: 2 Glasses of wine per week    Comment: rare  . Drug use: No  . Sexual activity: Yes  Other Topics Concern  . Not  on file  Social History Narrative  . Not on file   Social Determinants of Health   Financial Resource Strain: Not on file  Food Insecurity: Not on file  Transportation Needs: Not on file  Physical Activity: Not on file  Stress: Not on file  Social Connections: Not on file    Family History  Problem Relation Age of Onset  . Heart attack Mother 36  . Hypertension Mother   . Prostate cancer Father 4  . Diabetes Father   . Irritable bowel syndrome Father   . Colon cancer Brother  dx in his late 74's  . Irritable bowel syndrome Sister        x 2  . Irritable bowel syndrome Brother   . Stroke Neg Hx   . Esophageal cancer Neg Hx   . Rectal cancer Neg Hx   . Stomach cancer Neg Hx     Review of Systems  Constitutional: Negative for chills and fever.  HENT: Positive for congestion (bedtime only). Negative for sinus pressure, sinus pain and sore throat.   Respiratory: Positive for cough, chest tightness and shortness of breath (mild). Negative for wheezing.   Cardiovascular: Negative for chest pain.  Neurological: Negative for dizziness, light-headedness and headaches.       Objective:   Vitals:   10/20/20 1334  BP: 138/70  Pulse: 75  Temp: 98.6 F (37 C)  SpO2: 95%   BP Readings from Last 3 Encounters:  10/20/20 138/70  09/15/20 (!) 142/72  09/03/20 128/76   Wt Readings from Last 3 Encounters:  10/20/20 220 lb 3.2 oz (99.9 kg)  09/15/20 219 lb (99.3 kg)  09/03/20 217 lb (98.4 kg)   Body mass index is 33.98 kg/m.   Physical Exam    GENERAL APPEARANCE: Appears stated age, well appearing, NAD EYES: conjunctiva clear, no icterus HENT: bilateral tympanic membranes and ear canals normal, oropharynx with no erythema or exudates, trachea midline, no cervical or supraclavicular lymphadenopathy LUNGS: Unlabored breathing, good air entry bilaterally, clear to auscultation without wheeze or crackles CARDIOVASCULAR: Normal S1,S2 , no edema SKIN: Warm,  dry      Assessment & Plan:    See Problem List for Assessment and Plan of chronic medical problems.    This visit occurred during the SARS-CoV-2 public health emergency.  Safety protocols were in place, including screening questions prior to the visit, additional usage of staff PPE, and extensive cleaning of exam room while observing appropriate contact time as indicated for disinfecting solutions.

## 2020-10-20 NOTE — Telephone Encounter (Signed)
Patient called and said that he has bronchitis again and was wondering if cefdinir (OMNICEF) 300 MG capsule could be called into [facility], [place]. Please advise

## 2020-10-22 ENCOUNTER — Telehealth: Payer: Self-pay | Admitting: Internal Medicine

## 2020-10-22 MED ORDER — DOXYCYCLINE HYCLATE 100 MG PO TABS: 100.0000 mg | ORAL_TABLET | Freq: Two times a day (BID) | ORAL | 0 refills | Status: DC

## 2020-10-22 NOTE — Telephone Encounter (Signed)
abx sent

## 2020-10-22 NOTE — Telephone Encounter (Signed)
Patient called and said that he would like Dr. Quay Burow to call in an antibiotic. He said that the inhaler is not helping. It can be sent to [facility], [place]. Please advise

## 2020-10-22 NOTE — Telephone Encounter (Signed)
Pt has been informed that abx was sent

## 2020-10-22 NOTE — Addendum Note (Signed)
Addended by: Binnie Rail on: 10/22/2020 11:53 AM   Modules accepted: Orders

## 2020-11-08 ENCOUNTER — Other Ambulatory Visit: Payer: Self-pay | Admitting: Internal Medicine

## 2020-11-23 ENCOUNTER — Other Ambulatory Visit: Payer: Self-pay | Admitting: Endocrinology

## 2020-11-25 ENCOUNTER — Other Ambulatory Visit: Payer: Self-pay | Admitting: Internal Medicine

## 2020-12-20 ENCOUNTER — Other Ambulatory Visit: Payer: Self-pay | Admitting: Internal Medicine

## 2020-12-28 ENCOUNTER — Other Ambulatory Visit (INDEPENDENT_AMBULATORY_CARE_PROVIDER_SITE_OTHER): Payer: Medicare PPO

## 2020-12-28 ENCOUNTER — Other Ambulatory Visit: Payer: Self-pay | Admitting: Internal Medicine

## 2020-12-28 ENCOUNTER — Other Ambulatory Visit: Payer: Medicare PPO

## 2020-12-28 ENCOUNTER — Other Ambulatory Visit: Payer: Self-pay

## 2020-12-28 DIAGNOSIS — E782 Mixed hyperlipidemia: Secondary | ICD-10-CM | POA: Diagnosis not present

## 2020-12-28 DIAGNOSIS — N183 Chronic kidney disease, stage 3 unspecified: Secondary | ICD-10-CM | POA: Diagnosis not present

## 2020-12-28 DIAGNOSIS — I10 Essential (primary) hypertension: Secondary | ICD-10-CM | POA: Diagnosis not present

## 2020-12-28 DIAGNOSIS — E1129 Type 2 diabetes mellitus with other diabetic kidney complication: Secondary | ICD-10-CM | POA: Diagnosis not present

## 2020-12-28 LAB — COMPREHENSIVE METABOLIC PANEL
ALT: 63 U/L — ABNORMAL HIGH (ref 0–53)
AST: 23 U/L (ref 0–37)
Albumin: 4 g/dL (ref 3.5–5.2)
Alkaline Phosphatase: 71 U/L (ref 39–117)
BUN: 34 mg/dL — ABNORMAL HIGH (ref 6–23)
CO2: 19 mEq/L (ref 19–32)
Calcium: 9.4 mg/dL (ref 8.4–10.5)
Chloride: 112 mEq/L (ref 96–112)
Creatinine, Ser: 1.75 mg/dL — ABNORMAL HIGH (ref 0.40–1.50)
GFR: 38.02 mL/min — ABNORMAL LOW (ref >60.00)
Glucose, Bld: 101 mg/dL — ABNORMAL HIGH (ref 70–99)
Potassium: 4.3 mEq/L (ref 3.5–5.1)
Sodium: 138 mEq/L (ref 135–145)
Total Bilirubin: 0.6 mg/dL (ref 0.2–1.2)
Total Protein: 6.6 g/dL (ref 6.0–8.3)

## 2020-12-28 LAB — TSH: TSH: 2.07 u[IU]/mL (ref 0.35–4.50)

## 2020-12-28 LAB — CBC WITH DIFFERENTIAL/PLATELET
Basophils Absolute: 0 10*3/uL (ref 0.0–0.1)
Basophils Relative: 0.9 % (ref 0.0–3.0)
Eosinophils Absolute: 0.1 10*3/uL (ref 0.0–0.7)
Eosinophils Relative: 2.4 % (ref 0.0–5.0)
HCT: 40.8 % (ref 39.0–52.0)
Hemoglobin: 13.1 g/dL (ref 13.0–17.0)
Lymphocytes Relative: 26.5 % (ref 12.0–46.0)
Lymphs Abs: 1.1 10*3/uL (ref 0.7–4.0)
MCHC: 32.1 g/dL (ref 30.0–36.0)
MCV: 84.2 fl (ref 78.0–100.0)
Monocytes Absolute: 0.4 10*3/uL (ref 0.1–1.0)
Monocytes Relative: 9.1 % (ref 3.0–12.0)
Neutro Abs: 2.5 10*3/uL (ref 1.4–7.7)
Neutrophils Relative %: 61.1 % (ref 43.0–77.0)
Platelets: 200 10*3/uL (ref 150.0–400.0)
RBC: 4.85 Mil/uL (ref 4.22–5.81)
RDW: 15.2 % (ref 11.5–15.5)
WBC: 4.1 10*3/uL (ref 4.0–10.5)

## 2020-12-28 LAB — LIPID PANEL
Cholesterol: 132 mg/dL (ref 0–200)
HDL: 47.7 mg/dL (ref >39.00)
LDL Cholesterol: 73 mg/dL (ref 0–99)
NonHDL: 84.64
Total CHOL/HDL Ratio: 3
Triglycerides: 59 mg/dL (ref 0.0–149.0)
VLDL: 11.8 mg/dL (ref 0.0–40.0)

## 2020-12-28 LAB — HEMOGLOBIN A1C: Hgb A1c MFr Bld: 5.9 % (ref 4.6–6.5)

## 2020-12-29 ENCOUNTER — Ambulatory Visit (INDEPENDENT_AMBULATORY_CARE_PROVIDER_SITE_OTHER): Payer: Medicare PPO

## 2020-12-29 VITALS — BP 118/80 | HR 66 | Temp 97.9°F | Ht 68.0 in | Wt 218.8 lb

## 2020-12-29 DIAGNOSIS — I700 Atherosclerosis of aorta: Secondary | ICD-10-CM | POA: Insufficient documentation

## 2020-12-29 DIAGNOSIS — Z0000 Encounter for general adult medical examination without abnormal findings: Secondary | ICD-10-CM

## 2020-12-29 NOTE — Patient Instructions (Signed)
Noah Barnes , Thank you for taking time to come for your Medicare Wellness Visit. I appreciate your ongoing commitment to your health goals. Please review the following plan we discussed and let me know if I can assist you in the future.   Screening recommendations/referrals: Colonoscopy: 02/23/2020; due every 5 years Recommended yearly ophthalmology/optometry visit for glaucoma screening and checkup Recommended yearly dental visit for hygiene and checkup  Vaccinations: Influenza vaccine: 05/05/2020 Pneumococcal vaccine: 01/06/2019, 03/01/2020 Tdap vaccine: never done Shingles vaccine: never done   Covid-19: 08/13/2019, 09/10/2019, 04/06/2020  Advanced directives: Advance directive discussed with you today. Even though you declined this today please call our office should you change your mind and we can give you the proper paperwork for you to fill out.  Conditions/risks identified: My goal is to get back in the gym.  Next appointment: Please schedule your next Medicare Wellness Visit with your Nurse Health Advisor in 1 year by calling [phone].  Preventive Care 65 Years and Older, Male Preventive care refers to lifestyle choices and visits with your health care provider that can promote health and wellness. What does preventive care include? A yearly physical exam. This is also called an annual well check. Dental exams once or twice a year. Routine eye exams. Ask your health care provider how often you should have your eyes checked. Personal lifestyle choices, including: Daily care of your teeth and gums. Regular physical activity. Eating a healthy diet. Avoiding tobacco and drug use. Limiting alcohol use. Practicing safe sex. Taking low doses of aspirin every day. Taking vitamin and mineral supplements as recommended by your health care provider. What happens during an annual well check? The services and screenings done by your health care provider during your annual well check will  depend on your age, overall health, lifestyle risk factors, and family history of disease. Counseling  Your health care provider may ask you questions about your: Alcohol use. Tobacco use. Drug use. Emotional well-being. Home and relationship well-being. Sexual activity. Eating habits. History of falls. Memory and ability to understand (cognition). Work and work Statistician. Screening  You may have the following tests or measurements: Height, weight, and BMI. Blood pressure. Lipid and cholesterol levels. These may be checked every 5 years, or more frequently if you are over 82 years old. Skin check. Lung cancer screening. You may have this screening every year starting at age 86 if you have a 30-pack-year history of smoking and currently smoke or have quit within the past 15 years. Fecal occult blood test (FOBT) of the stool. You may have this test every year starting at age 17. Flexible sigmoidoscopy or colonoscopy. You may have a sigmoidoscopy every 5 years or a colonoscopy every 10 years starting at age 25. Prostate cancer screening. Recommendations will vary depending on your family history and other risks. Hepatitis C blood test. Hepatitis B blood test. Sexually transmitted disease (STD) testing. Diabetes screening. This is done by checking your blood sugar (glucose) after you have not eaten for a while (fasting). You may have this done every 1-3 years. Abdominal aortic aneurysm (AAA) screening. You may need this if you are a current or former smoker. Osteoporosis. You may be screened starting at age 68 if you are at high risk. Talk with your health care provider about your test results, treatment options, and if necessary, the need for more tests. Vaccines  Your health care provider may recommend certain vaccines, such as: Influenza vaccine. This is recommended every year. Tetanus, diphtheria, and acellular  pertussis (Tdap, Td) vaccine. You may need a Td booster every 10  years. Zoster vaccine. You may need this after age 87. Pneumococcal 13-valent conjugate (PCV13) vaccine. One dose is recommended after age 51. Pneumococcal polysaccharide (PPSV23) vaccine. One dose is recommended after age 65. Talk to your health care provider about which screenings and vaccines you need and how often you need them. This information is not intended to replace advice given to you by your health care provider. Make sure you discuss any questions you have with your health care provider. Document Released: 07/16/2015 Document Revised: 03/08/2016 Document Reviewed: 04/20/2015 Elsevier Interactive Patient Education  2017 [employer] Prevention in the Home Falls can cause injuries. They can happen to people of all ages. There are many things you can do to make your home safe and to help prevent falls. What can I do on the outside of my home? Regularly fix the edges of walkways and driveways and fix any cracks. Remove anything that might make you trip as you walk through a door, such as a raised step or threshold. Trim any bushes or trees on the path to your home. Use bright outdoor lighting. Clear any walking paths of anything that might make someone trip, such as rocks or tools. Regularly check to see if handrails are loose or broken. Make sure that both sides of any steps have handrails. Any raised decks and porches should have guardrails on the edges. Have any leaves, snow, or ice cleared regularly. Use sand or salt on walking paths during winter. Clean up any spills in your garage right away. This includes oil or grease spills. What can I do in the bathroom? Use night lights. Install grab bars by the toilet and in the tub and shower. Do not use towel bars as grab bars. Use non-skid mats or decals in the tub or shower. If you need to sit down in the shower, use a plastic, non-slip stool. Keep the floor dry. Clean up any water that spills on the floor as soon as it  happens. Remove soap buildup in the tub or shower regularly. Attach bath mats securely with double-sided non-slip rug tape. Do not have throw rugs and other things on the floor that can make you trip. What can I do in the bedroom? Use night lights. Make sure that you have a light by your bed that is easy to reach. Do not use any sheets or blankets that are too big for your bed. They should not hang down onto the floor. Have a firm chair that has side arms. You can use this for support while you get dressed. Do not have throw rugs and other things on the floor that can make you trip. What can I do in the kitchen? Clean up any spills right away. Avoid walking on wet floors. Keep items that you use a lot in easy-to-reach places. If you need to reach something above you, use a strong step stool that has a grab bar. Keep electrical cords out of the way. Do not use floor polish or wax that makes floors slippery. If you must use wax, use non-skid floor wax. Do not have throw rugs and other things on the floor that can make you trip. What can I do with my stairs? Do not leave any items on the stairs. Make sure that there are handrails on both sides of the stairs and use them. Fix handrails that are broken or loose. Make sure that handrails  are as long as the stairways. Check any carpeting to make sure that it is firmly attached to the stairs. Fix any carpet that is loose or worn. Avoid having throw rugs at the top or bottom of the stairs. If you do have throw rugs, attach them to the floor with carpet tape. Make sure that you have a light switch at the top of the stairs and the bottom of the stairs. If you do not have them, ask someone to add them for you. What else can I do to help prevent falls? Wear shoes that: Do not have high heels. Have rubber bottoms. Are comfortable and fit you well. Are closed at the toe. Do not wear sandals. If you use a stepladder: Make sure that it is fully opened.  Do not climb a closed stepladder. Make sure that both sides of the stepladder are locked into place. Ask someone to hold it for you, if possible. Clearly mark and make sure that you can see: Any grab bars or handrails. First and last steps. Where the edge of each step is. Use tools that help you move around (mobility aids) if they are needed. These include: Canes. Walkers. Scooters. Crutches. Turn on the lights when you go into a dark area. Replace any light bulbs as soon as they burn out. Set up your furniture so you have a clear path. Avoid moving your furniture around. If any of your floors are uneven, fix them. If there are any pets around you, be aware of where they are. Review your medicines with your doctor. Some medicines can make you feel dizzy. This can increase your chance of falling. Ask your doctor what other things that you can do to help prevent falls. This information is not intended to replace advice given to you by your health care provider. Make sure you discuss any questions you have with your health care provider. Document Released: 04/15/2009 Document Revised: 11/25/2015 Document Reviewed: 07/24/2014 Elsevier Interactive Patient Education  2017 [employer].

## 2020-12-29 NOTE — Progress Notes (Signed)
Subjective:    Patient ID: Noah Barnes, male    DOB: 1946-01-15, 75 y.o.   MRN: [id number]  HPI He is here for a physical exam.   He has no concerns except his right pinky finger that gets stuck in a contracted position at at times.   Medications and allergies reviewed with patient and updated if appropriate.  Patient Active Problem List   Diagnosis Date Noted   Acquired trigger finger of right little finger 12/30/2020   Bilateral primary osteoarthritis of knee 12/30/2020   Aortic atherosclerosis ([place]) 12/29/2020   Reactive airway disease 10/20/2020   Acute bronchitis 07/28/2020   BPH (benign prostatic hyperplasia) 07/05/2020   Left lower quadrant abdominal pain 11/28/2019   Abnormal findings on diagnostic imaging of lung 04/04/2019   History of DVT of lower extremity, b/l 04/03/2019   History of pulmonary embolus (PE) 04/03/2019   Hypomagnesemia 04/03/2019   HCAP (healthcare-associated pneumonia) 03/24/2019   Nephrolithiasis 03/06/2019   Seasonal allergic rhinitis 05/22/2018   MR (mitral regurgitation) 12/20/2017   CKD (chronic kidney disease) stage 3, GFR 30-59 ml/min ([place]) 08/14/2017   Abdominal hernia without obstruction and without gangrene 01/10/2016   Chronic diastolic heart failure ([place]) 02/25/2015   Right renal mass 02/25/2015   Other constipation 02/22/2015   Chronic venous insufficiency 01/18/2015   Superficial phlebitis 01/18/2015   IBS (irritable bowel syndrome) 11/12/2013   Family history of prostate cancer 12/26/2011   Diabetes mellitus with renal complications ([place]) 58/52/7782   Gout 12/31/2007   HYPERLIPIDEMIA 08/14/2007   ERECTILE DYSFUNCTION 05/09/2007   Essential hypertension 11/28/2006    Current Outpatient Medications on File Prior to Visit  Medication Sig Dispense Refill   Accu-Chek Softclix Lancets lancets CHECK BLOOD SUGAR TWICE DAILY AS DIRECTED 100 each 2   allopurinol (ZYLOPRIM) 300 MG tablet Take 1 tablet by mouth once daily 90 tablet  0   apixaban (ELIQUIS) 2.5 MG TABS tablet Take 1 tablet (2.5 mg total) by mouth 2 (two) times daily. 180 tablet 3   atorvastatin (LIPITOR) 20 MG tablet Take 1 tablet by mouth once daily 90 tablet 0   Blood Glucose Monitoring Suppl (ACCU-CHEK GUIDE ME) w/Device KIT 1 each by Does not apply route in the morning and at bedtime. Use Accu Chek Guide me device to check blood sugar twice daily. 1 kit 0   FERREX 150 150 MG capsule Take 1 capsule by mouth once daily 90 capsule 0   Fluticasone-Salmeterol (ADVAIR) 100-50 MCG/DOSE AEPB Inhale 1 puff into the lungs 2 (two) times daily as needed (chest tightness). 1 each 3   furosemide (LASIX) 40 MG tablet Take 40 mg by mouth daily.     glimepiride (AMARYL) 4 MG tablet TAKE 1 TABLET BY MOUTH ONCE DAILY BEFORE SUPPER 90 tablet 1   glucose blood (ACCU-CHEK GUIDE) test strip TEST BLOOD SUGAR TWICE DAILY AS INSTRUCTED 100 strip 3   hydrALAZINE (APRESOLINE) 50 MG tablet Take 1 tablet by mouth twice daily 180 tablet 0   LINZESS 290 MCG CAPS capsule TAKE 1 CAPSULE BY MOUTH ONCE DAILY BEFORE BREAKFAST 30 capsule 4   metoprolol tartrate (LOPRESSOR) 100 MG tablet Take 1 tablet (100 mg total) by mouth 2 (two) times daily. 180 tablet 3   Semaglutide (RYBELSUS) 14 MG TABS TAKE 1 TABLET BY MOUTH ONCE DAILY 30  MINUTES  BEFORE  BREAKFAST  WITH  A  SIP  OF  WATER 30 tablet 5   sildenafil (VIAGRA) 100 MG tablet TAKE 1 TABLET BY  MOUTH EVERY DAY AS NEEDED FOR ERECTILE DYSFUNCTION 6 tablet 1   tamsulosin (FLOMAX) 0.4 MG CAPS capsule TAKE 1 CAPSULE BY MOUTH ONCE DAILY AFTER SUPPER 90 capsule 0   No current facility-administered medications on file prior to visit.    Past Medical History:  Diagnosis Date   CHF (congestive heart failure) (HCC)    Chronic diastolic heart failure (HCC)    Constipation    Diabetes mellitus    Diverticulitis    Diverticulosis    ED (erectile dysfunction)    Gout    Heart murmur    Hyperlipidemia    Hypertension    IBS (irritable bowel  syndrome)    Renal calculus    Renal insufficiency    Tubular adenoma of colon     Past Surgical History:  Procedure Laterality Date   COLONOSCOPY  1998   negative; no F/U   EXTRACORPOREAL SHOCK WAVE LITHOTRIPSY Left 03/20/2019   Procedure: EXTRACORPOREAL SHOCK WAVE LITHOTRIPSY (ESWL);  Surgeon: Cleon Gustin, MD;  Location: [facility];  Service: Urology;  Laterality: Left;   knee effusion tapped      Social History   Socioeconomic History   Marital status: Married    Spouse name: Not on file   Number of children: 1   Years of education: Not on file   Highest education level: Not on file  Occupational History   Occupation: Metallurgist: [employer]    Comment: [phone]  Tobacco Use   Smoking status: Former    Packs/day: 0.30    Pack years: 0.00    Types: Cigarettes    Quit date: 07/04/1991    Years since quitting: 29.5   Smokeless tobacco: Never   Tobacco comments:    smoked age [age], up to 1/3 ppd; may be less  Vaping Use   Vaping Use: Never used  Substance and Sexual Activity   Alcohol use: Yes    Alcohol/week: 2.0 standard drinks    Types: 2 Glasses of wine per week    Comment: rare   Drug use: No   Sexual activity: Yes  Other Topics Concern   Not on file  Social History Narrative   Not on file   Social Determinants of Health   Financial Resource Strain: Low Risk    Difficulty of Paying Living Expenses: Not hard at all  Food Insecurity: No Food Insecurity   Worried About Charity fundraiser in the Last Year: Never true   [place] in the Last Year: Never true  Transportation Needs: No Transportation Needs   Lack of Transportation (Medical): No   Lack of Transportation (Non-Medical): No  Physical Activity: Inactive   Days of Exercise per Week: 0 days   Minutes of Exercise per Session: 0 min  Stress: No Stress Concern Present   Feeling of Stress : Not at all  Social Connections: Socially Integrated   Frequency  of Communication with Friends and Family: More than three times a week   Frequency of Social Gatherings with Friends and Family: More than three times a week   Attends Religious Services: More than 4 times per year   Active Member of [employer] or Organizations: Yes   Attends Music therapist: More than 4 times per year   Marital Status: Married    Family History  Problem Relation Age of Onset   Heart attack Mother 62   Hypertension Mother    Prostate  cancer Father 40   Diabetes Father    Irritable bowel syndrome Father    Colon cancer Brother        dx in his late 22's   Irritable bowel syndrome Sister        x 2   Irritable bowel syndrome Brother    Stroke Neg Hx    Esophageal cancer Neg Hx    Rectal cancer Neg Hx    Stomach cancer Neg Hx     Review of Systems  Constitutional:  Negative for chills and fever.  Eyes:  Negative for visual disturbance.  Respiratory:  Negative for cough, chest tightness, shortness of breath and wheezing.   Cardiovascular:  Positive for leg swelling (controlled). Negative for chest pain and palpitations.  Gastrointestinal:  Negative for abdominal pain, blood in stool, constipation, diarrhea and nausea.       Rare gerd  Genitourinary:  Negative for difficulty urinating, dysuria and hematuria.  Musculoskeletal:  Positive for arthralgias (knees, shoulder). Negative for back pain.  Skin:  Negative for color change and rash.  Neurological:  Negative for dizziness, light-headedness, numbness and headaches.  Psychiatric/Behavioral:  Negative for dysphoric mood. The patient is not nervous/anxious.       Objective:   Vitals:   12/30/20 0951  BP: 122/80  Pulse: 71  Temp: 97.9 F (36.6 C)  SpO2: 97%   Filed Weights   12/30/20 0951  Weight: 216 lb (98 kg)   Body mass index is 32.84 kg/m.  BP Readings from Last 3 Encounters:  12/30/20 122/80  12/29/20 118/80  10/20/20 138/70    Wt Readings from Last 3 Encounters:  12/30/20 216  lb (98 kg)  12/29/20 218 lb 12.8 oz (99.2 kg)  10/20/20 220 lb 3.2 oz (99.9 kg)     Physical Exam Constitutional: He appears well-developed and well-nourished. No distress.  HENT:  Head: Normocephalic and atraumatic.  Right Ear: External ear normal.  Left Ear: External ear normal.  Mouth/Throat: Oropharynx is clear and moist.  Normal ear canals and TM b/l  Eyes: Conjunctivae and EOM are normal.  Neck: Neck supple. No tracheal deviation present. No thyromegaly present.  No carotid bruit  Cardiovascular: Normal rate, regular rhythm, normal heart sounds and intact distal pulses.   No murmur heard. Pulmonary/Chest: Effort normal and breath sounds normal. No respiratory distress. He has no wheezes. He has no rales.  Abdominal: Soft. Non tender and reducible umbilical and ventral hernias.  He exhibits no distension. There is no tenderness.  Genitourinary: deferred  Musculoskeletal: He exhibits no edema.  Lymphadenopathy:   He has no cervical adenopathy.  Skin: Skin is warm and dry. He is not diaphoretic.  Psychiatric: He has a normal mood and affect. His behavior is normal.    Estimated Creatinine Clearance: 42.6 mL/min (A) (by C-G formula based on SCr of 1.75 mg/dL (H)).      Assessment & Plan:   Physical exam: Screening blood work  reviewed Immunizations  will get covid booster #2, discussed shingrix and tdap Colonoscopy   Up to date  Eye exams   due now - scheduled Exercise   none Weight  encouraged weight loss Substance abuse   none  See Problem List for Assessment and Plan of chronic medical problems.   This visit occurred during the SARS-CoV-2 public health emergency.  Safety protocols were in place, including screening questions prior to the visit, additional usage of staff PPE, and extensive cleaning of exam room while observing appropriate contact time as  indicated for disinfecting solutions.

## 2020-12-29 NOTE — Progress Notes (Signed)
Subjective:   Noah Barnes is a 75 y.o. male who presents for Medicare Annual/Subsequent preventive examination.  Review of Systems     Cardiac Risk Factors include: advanced age (>73mn, >>40women);diabetes mellitus;dyslipidemia;family history of premature cardiovascular disease;hypertension;male gender;obesity (BMI >30kg/m2)     Objective:    Today's Vitals   12/29/20 1446  BP: 118/80  Pulse: 66  Temp: 97.9 F (36.6 C)  SpO2: 95%  Weight: 218 lb 12.8 oz (99.2 kg)  Height: _0  (1.727 m)  PainSc: 0-No pain   Body mass index is 33.27 kg/m.  Advanced Directives 12/29/2020 03/25/2020 09/29/2019 09/23/2019 07/22/2019 04/17/2019 03/24/2019  Does Patient Have a Medical Advance Directive? _1  No No  Does patient want to make changes to medical advance directive? - - - - - - -  Would patient like information on creating a medical advance directive? No - Patient declined No - Patient declined No - Patient declined No - Patient declined No - Patient declined No - Patient declined No - Patient declined    Current Medications (verified) Outpatient Encounter Medications as of 12/29/2020  Medication Sig   Accu-Chek Softclix Lancets lancets CHECK BLOOD SUGAR TWICE DAILY AS DIRECTED   allopurinol (ZYLOPRIM) 300 MG tablet Take 1 tablet by mouth once daily   apixaban (ELIQUIS) 2.5 MG TABS tablet Take 1 tablet (2.5 mg total) by mouth 2 (two) times daily.   atorvastatin (LIPITOR) 20 MG tablet Take 1 tablet by mouth once daily   Blood Glucose Monitoring Suppl (ACCU-CHEK GUIDE ME) w/Device KIT 1 each by Does not apply route in the morning and at bedtime. Use Accu Chek Guide me device to check blood sugar twice daily.   doxycycline (VIBRA-TABS) 100 MG tablet Take 1 tablet (100 mg total) by mouth 2 (two) times daily.   FERREX 150 150 MG capsule Take 1 capsule by mouth once daily   Fluticasone-Salmeterol (ADVAIR) 100-50 MCG/DOSE AEPB Inhale 1 puff into the lungs 2 (two) times daily as  needed (chest tightness).   furosemide (LASIX) 40 MG tablet Take 40 mg by mouth daily.   glimepiride (AMARYL) 4 MG tablet TAKE 1 TABLET BY MOUTH ONCE DAILY BEFORE SUPPER   glucose blood (ACCU-CHEK GUIDE) test strip TEST BLOOD SUGAR TWICE DAILY AS INSTRUCTED   hydrALAZINE (APRESOLINE) 50 MG tablet Take 1 tablet by mouth twice daily   LINZESS 290 MCG CAPS capsule TAKE 1 CAPSULE BY MOUTH ONCE DAILY BEFORE BREAKFAST   metoprolol tartrate (LOPRESSOR) 100 MG tablet Take 1 tablet (100 mg total) by mouth 2 (two) times daily.   promethazine-dextromethorphan (PROMETHAZINE-DM) 6.25-15 MG/5ML syrup Take 5 mLs by mouth 4 (four) times daily as needed for cough.   Semaglutide (RYBELSUS) 14 MG TABS TAKE 1 TABLET BY MOUTH ONCE DAILY 30  MINUTES  BEFORE  BREAKFAST  WITH  A  SIP  OF  WATER   sildenafil (VIAGRA) 100 MG tablet TAKE 1 TABLET BY MOUTH EVERY DAY AS NEEDED FOR ERECTILE DYSFUNCTION   tamsulosin (FLOMAX) 0.4 MG CAPS capsule TAKE 1 CAPSULE BY MOUTH ONCE DAILY AFTER SUPPER   No facility-administered encounter medications on file as of 12/29/2020.    Allergies (verified) Ace inhibitors and Angiotensin receptor blockers   History: Past Medical History:  Diagnosis Date   CHF (congestive heart failure) (HCC)    Chronic diastolic heart failure (HCC)    Constipation    Diabetes mellitus    Diverticulitis    Diverticulosis    ED (erectile dysfunction)  Gout    Heart murmur    Hyperlipidemia    Hypertension    IBS (irritable bowel syndrome)    Renal calculus    Renal insufficiency    Tubular adenoma of colon    Past Surgical History:  Procedure Laterality Date   COLONOSCOPY  1998   negative; no F/U   EXTRACORPOREAL SHOCK WAVE LITHOTRIPSY Left 03/20/2019   Procedure: EXTRACORPOREAL SHOCK WAVE LITHOTRIPSY (ESWL);  Surgeon: Cleon Gustin, MD;  Location: [facility];  Service: Urology;  Laterality: Left;   knee effusion tapped     Family History  Problem Relation Age of Onset   Heart attack  Mother 76   Hypertension Mother    Prostate cancer Father 5   Diabetes Father    Irritable bowel syndrome Father    Colon cancer Brother        dx in his late 26's   Irritable bowel syndrome Sister        x 2   Irritable bowel syndrome Brother    Stroke Neg Hx    Esophageal cancer Neg Hx    Rectal cancer Neg Hx    Stomach cancer Neg Hx    Social History   Socioeconomic History   Marital status: Married    Spouse name: Not on file   Number of children: 1   Years of education: Not on file   Highest education level: Not on file  Occupational History   Occupation: Metallurgist: [employer]    Comment: [phone]  Tobacco Use   Smoking status: Former    Packs/day: 0.30    Pack years: 0.00    Types: Cigarettes    Quit date: 07/04/1991    Years since quitting: 29.5   Smokeless tobacco: Never   Tobacco comments:    smoked age [age], up to 1/3 ppd; may be less  Vaping Use   Vaping Use: Never used  Substance and Sexual Activity   Alcohol use: Yes    Alcohol/week: 2.0 standard drinks    Types: 2 Glasses of wine per week    Comment: rare   Drug use: No   Sexual activity: Yes  Other Topics Concern   Not on file  Social History Narrative   Not on file   Social Determinants of Health   Financial Resource Strain: Low Risk    Difficulty of Paying Living Expenses: Not hard at all  Food Insecurity: No Food Insecurity   Worried About Charity fundraiser in the Last Year: Never true   [place] in the Last Year: Never true  Transportation Needs: No Transportation Needs   Lack of Transportation (Medical): No   Lack of Transportation (Non-Medical): No  Physical Activity: Inactive   Days of Exercise per Week: 0 days   Minutes of Exercise per Session: 0 min  Stress: No Stress Concern Present   Feeling of Stress : Not at all  Social Connections: Socially Integrated   Frequency of Communication with Friends and Family: More than three times  a week   Frequency of Social Gatherings with Friends and Family: More than three times a week   Attends Religious Services: More than 4 times per year   Active Member of [employer] or Organizations: Yes   Attends Music therapist: More than 4 times per year   Marital Status: Married    Tobacco Counseling Counseling given: Not Answered Tobacco comments: smoked age [age],  up to 1/3 ppd; may be less   Clinical Intake:     Pain : No/denies pain Pain Score: 0-No pain     BMI - recorded: 33.27 Nutritional Status: BMI > 30  Obese Nutritional Risks: None Diabetes: Yes CBG done?: No Did pt. bring in CBG monitor from home?: No  How often do you need to have someone help you when you read instructions, pamphlets, or other written materials from your doctor or pharmacy?: 1 - Never What is the last grade level you completed in school?: Bachelor's Degree  Diabetic? yes  Interpreter Needed?: No  Information entered by :: Lisette Abu, LPN   Activities of Daily Living In your present state of health, do you have any difficulty performing the following activities: 12/29/2020 07/06/2020  Hearing? N N  Vision? N N  Difficulty concentrating or making decisions? N N  Walking or climbing stairs? N N  Dressing or bathing? N N  Doing errands, shopping? N N  Preparing Food and eating ? N -  Using the Toilet? N -  In the past six months, have you accidently leaked urine? N -  Do you have problems with loss of bowel control? N -  Managing your Medications? N -  Managing your Finances? N -  Housekeeping or managing your Housekeeping? N -  Some recent data might be hidden    Patient Care Team: Binnie Rail, MD as PCP - General (Internal Medicine) Sherren Mocha, MD as PCP - Cardiology (Cardiology) Elayne Snare, MD as Consulting Physician (Endocrinology)  Indicate any recent Medical Services you may have received from other than [facility] providers in the past year (date may be  approximate).     Assessment:   This is a routine wellness examination for [name].  Hearing/Vision screen No results found.  Dietary issues and exercise activities discussed: Current Exercise Habits: The patient does not participate in regular exercise at present, Exercise limited by: None identified   Goals Addressed               This Visit's Progress     Patient Stated (pt-stated)        My goal is to get back in the gym.        Depression Screen PHQ 2/9 Scores 12/29/2020 10/20/2020 09/29/2019 01/06/2019 08/13/2018 08/08/2017 07/12/2016  PHQ - 2 Score 0 0 0 0 0 0 0  PHQ- 9 Score - - - - - 0 -    Fall Risk Fall Risk  12/29/2020 10/20/2020 09/29/2019 04/04/2019 01/06/2019  Falls in the past year? 0 0 0 0 0  Number falls in past yr: 0 0 0 0 0  Injury with Fall? 0 0 0 - -  Risk for fall due to : No Fall Risks No Fall Risks No Fall Risks - -  Follow up Falls evaluation completed Falls evaluation completed Falls evaluation completed;Falls prevention discussed;Education provided - -    FALL RISK PREVENTION PERTAINING TO THE HOME:  Any stairs in or around the home? Yes  If so, are there any without handrails? No  Home free of loose throw rugs in walkways, pet beds, electrical cords, etc? Yes  Adequate lighting in your home to reduce risk of falls? Yes   ASSISTIVE DEVICES UTILIZED TO PREVENT FALLS:  Life alert? No  Use of a cane, walker or w/c? No  Grab bars in the bathroom? No  Shower chair or bench in shower? No  Elevated toilet seat or a handicapped toilet? No  TIMED UP AND GO:  Was the test performed? Yes .  Length of time to ambulate 10 feet: 7 sec.   Gait steady and fast without use of assistive device  Cognitive Function: MMSE - Mini Mental State Exam 08/08/2017 12/15/2014  Not completed: - Unable to complete  Orientation to time 5 -  Orientation to Place 5 -  Registration 3 -  Attention/ Calculation 5 -  Recall 3 -  Language- name 2 objects 2 -  Language-  repeat 1 -  Language- follow 3 step command 3 -  Language- read & follow direction 1 -  Write a sentence 1 -  Copy design 1 -  Total score 30 -     6CIT Screen 09/29/2019  What Year? 0 points  What month? 0 points  What time? 0 points  Count back from 20 0 points  Months in reverse 0 points  Repeat phrase 0 points  Total Score 0    Immunizations Immunization History  Administered Date(s) Administered   Fluad Quad(high Dose 65+) 03/06/2019, 05/05/2020   Influenza, High Dose Seasonal PF 07/12/2016, 04/10/2017, 03/22/2018   PFIZER(Purple Top)SARS-COV-2 Vaccination 08/13/2019, 09/10/2019, 04/06/2020   Pneumococcal Conjugate-13 01/06/2019   Pneumococcal Polysaccharide-23 03/01/2020    TDAP status: Due, Education has been provided regarding the importance of this vaccine. Advised may receive this vaccine at local pharmacy or Health Dept. Aware to provide a copy of the vaccination record if obtained from local pharmacy or Health Dept. Verbalized acceptance and understanding.  Flu Vaccine status: Up to date  Pneumococcal vaccine status: Up to date  Covid-19 vaccine status: Completed vaccines  Qualifies for Shingles Vaccine? Yes   Zostavax completed No   Shingrix Completed?: No.    Education has been provided regarding the importance of this vaccine. Patient has been advised to call insurance company to determine out of pocket expense if they have not yet received this vaccine. Advised may also receive vaccine at local pharmacy or Health Dept. Verbalized acceptance and understanding.  Screening Tests Health Maintenance  Topic Date Due   TETANUS/TDAP  Never done   Zoster Vaccines- Shingrix (1 of 2) Never done   COVID-19 Vaccine (4 - Booster for Pfizer series) 08/07/2020   OPHTHALMOLOGY EXAM  12/15/2020   INFLUENZA VACCINE  01/31/2021   FOOT EXAM  05/21/2021   HEMOGLOBIN A1C  06/29/2021   COLONOSCOPY (Pts 45-63yr Insurance coverage will need to be confirmed)  02/22/2025    Hepatitis C Screening  Completed   PNA vac Low Risk Adult  Completed   HPV VACCINES  Aged Out    Health Maintenance  Health Maintenance Due  Topic Date Due   TETANUS/TDAP  Never done   Zoster Vaccines- Shingrix (1 of 2) Never done   COVID-19 Vaccine (4 - Booster for Pfizer series) 08/07/2020   OPHTHALMOLOGY EXAM  12/15/2020    Colorectal cancer screening: Type of screening: Colonoscopy. Completed 02/23/2020. Repeat every 5 years  Lung Cancer Screening: (Low Dose CT Chest recommended if Age 74-80years, 30 pack-year currently smoking OR have quit w/in 15years.) does not qualify.   Lung Cancer Screening Referral: no  Additional Screening:  Hepatitis C Screening: does qualify; Completed yes  Vision Screening: Recommended annual ophthalmology exams for early detection of glaucoma and other disorders of the eye. Is the patient up to date with their annual eye exam?  Yes  Who is the provider or what is the name of the office in which the patient attends annual eye exams? [facility] If pt is not established with a provider, would they like to be referred to a provider to establish care? No .   Dental Screening: Recommended annual dental exams for proper oral hygiene  Community Resource Referral / Chronic Care Management: CRR required this visit?  No   CCM required this visit?  No      Plan:     I have personally reviewed and noted the following in the patient's chart:   Medical and social history Use of alcohol, tobacco or illicit drugs  Current medications and supplements including opioid prescriptions. Patient is not currently taking opioid prescriptions. Functional ability and status Nutritional status Physical activity Advanced directives List of other physicians Hospitalizations, surgeries, and ER visits in previous 12 months Vitals Screenings to include cognitive, depression, and falls Referrals and appointments  In addition, I have reviewed and  discussed with patient certain preventive protocols, quality metrics, and best practice recommendations. A written personalized care plan for preventive services as well as general preventive health recommendations were provided to patient.     Sheral Flow, LPN   [id number]   Nurse Notes: n/a

## 2020-12-29 NOTE — Patient Instructions (Addendum)
Medications changes include :   none     Please followup in 6 months   Health Maintenance, Male Adopting a healthy lifestyle and getting preventive care are important in promoting health and wellness. Ask your health care provider about: The right schedule for you to have regular tests and exams. Things you can do on your own to prevent diseases and keep yourself healthy. What should I know about diet, weight, and exercise? Eat a healthy diet  Eat a diet that includes plenty of vegetables, fruits, low-fat dairy products, and lean protein. Do not eat a lot of foods that are high in solid fats, added sugars, or sodium.  Maintain a healthy weight Body mass index (BMI) is a measurement that can be used to identify possible weight problems. It estimates body fat based on height and weight. Your health care provider can help determine your BMI and help you achieve or maintain ahealthy weight. Get regular exercise Get regular exercise. This is one of the most important things you can do for your health. Most adults should: Exercise for at least 150 minutes each week. The exercise should increase your heart rate and make you sweat (moderate-intensity exercise). Do strengthening exercises at least twice a week. This is in addition to the moderate-intensity exercise. Spend less time sitting. Even light physical activity can be beneficial. Watch cholesterol and blood lipids Have your blood tested for lipids and cholesterol at 74 years of age, then havethis test every 5 years. You may need to have your cholesterol levels checked more often if: Your lipid or cholesterol levels are high. You are older than 74 years of age. You are at high risk for heart disease. What should I know about cancer screening? Many types of cancers can be detected early and may often be prevented. Depending on your health history and family history, you may need to have cancer screening at various ages. This may  include screening for: Colorectal cancer. Prostate cancer. Skin cancer. Lung cancer. What should I know about heart disease, diabetes, and high blood pressure? Blood pressure and heart disease High blood pressure causes heart disease and increases the risk of stroke. This is more likely to develop in people who have high blood pressure readings, are of African descent, or are overweight. Talk with your health care provider about your target blood pressure readings. Have your blood pressure checked: Every 3-5 years if you are 74-10 years of age. Every year if you are 74 years old or older. If you are between the ages of 74 and 27 and are a current or former smoker, ask your health care provider if you should have a one-time screening for abdominal aortic aneurysm (AAA). Diabetes Have regular diabetes screenings. This checks your fasting blood sugar level. Have the screening done: Once every three years after age 11 if you are at a normal weight and have a low risk for diabetes. More often and at a younger age if you are overweight or have a high risk for diabetes. What should I know about preventing infection? Hepatitis B If you have a higher risk for hepatitis B, you should be screened for this virus. Talk with your health care provider to find out if you are at risk forhepatitis B infection. Hepatitis C Blood testing is recommended for: Everyone born from [birth year] through 1964. Anyone with known risk factors for hepatitis C. Sexually transmitted infections (STIs) You should be screened each year for STIs, including gonorrhea and chlamydia, if:  You are sexually active and are younger than 74 years of age. You are older than 74 years of age and your health care provider tells you that you are at risk for this type of infection. Your sexual activity has changed since you were last screened, and you are at increased risk for chlamydia or gonorrhea. Ask your health care provider if you are at  risk. Ask your health care provider about whether you are at high risk for HIV. Your health care provider may recommend a prescription medicine to help prevent HIV infection. If you choose to take medicine to prevent HIV, you should first get tested for HIV. You should then be tested every 3 months for as long as you are taking the medicine. Follow these instructions at home: Lifestyle Do not use any products that contain nicotine or tobacco, such as cigarettes, e-cigarettes, and chewing tobacco. If you need help quitting, ask your health care provider. Do not use street drugs. Do not share needles. Ask your health care provider for help if you need support or information about quitting drugs. Alcohol use Do not drink alcohol if your health care provider tells you not to drink. If you drink alcohol: Limit how much you have to 0-2 drinks a day. Be aware of how much alcohol is in your drink. In the [place] one drink equals one 12 oz bottle of beer (355 mL), one 5 oz glass of wine (148 mL), or one 1 oz glass of hard liquor (44 mL). General instructions Schedule regular health, dental, and eye exams. Stay current with your vaccines. Tell your health care provider if: You often feel depressed. You have ever been abused or do not feel safe at home. Summary Adopting a healthy lifestyle and getting preventive care are important in promoting health and wellness. Follow your health care provider's instructions about healthy diet, exercising, and getting tested or screened for diseases. Follow your health care provider's instructions on monitoring your cholesterol and blood pressure. This information is not intended to replace advice given to you by your health care provider. Make sure you discuss any questions you have with your healthcare provider. Document Revised: 06/12/2018 Document Reviewed: 06/12/2018 Elsevier Patient Education  2022 [employer].

## 2020-12-30 ENCOUNTER — Other Ambulatory Visit: Payer: Self-pay

## 2020-12-30 ENCOUNTER — Ambulatory Visit (INDEPENDENT_AMBULATORY_CARE_PROVIDER_SITE_OTHER): Payer: Medicare PPO | Admitting: Internal Medicine

## 2020-12-30 ENCOUNTER — Encounter: Payer: Self-pay | Admitting: Internal Medicine

## 2020-12-30 VITALS — BP 122/80 | HR 71 | Temp 97.9°F | Ht 68.0 in | Wt 216.0 lb

## 2020-12-30 DIAGNOSIS — I700 Atherosclerosis of aorta: Secondary | ICD-10-CM | POA: Diagnosis not present

## 2020-12-30 DIAGNOSIS — E1129 Type 2 diabetes mellitus with other diabetic kidney complication: Secondary | ICD-10-CM

## 2020-12-30 DIAGNOSIS — I10 Essential (primary) hypertension: Secondary | ICD-10-CM | POA: Diagnosis not present

## 2020-12-30 DIAGNOSIS — M109 Gout, unspecified: Secondary | ICD-10-CM | POA: Diagnosis not present

## 2020-12-30 DIAGNOSIS — N401 Enlarged prostate with lower urinary tract symptoms: Secondary | ICD-10-CM

## 2020-12-30 DIAGNOSIS — R911 Solitary pulmonary nodule: Secondary | ICD-10-CM

## 2020-12-30 DIAGNOSIS — M170 Bilateral primary osteoarthritis of knee: Secondary | ICD-10-CM | POA: Insufficient documentation

## 2020-12-30 DIAGNOSIS — E782 Mixed hyperlipidemia: Secondary | ICD-10-CM | POA: Diagnosis not present

## 2020-12-30 DIAGNOSIS — Z0001 Encounter for general adult medical examination with abnormal findings: Secondary | ICD-10-CM | POA: Diagnosis not present

## 2020-12-30 DIAGNOSIS — I5032 Chronic diastolic (congestive) heart failure: Secondary | ICD-10-CM

## 2020-12-30 DIAGNOSIS — N183 Chronic kidney disease, stage 3 unspecified: Secondary | ICD-10-CM | POA: Diagnosis not present

## 2020-12-30 DIAGNOSIS — K469 Unspecified abdominal hernia without obstruction or gangrene: Secondary | ICD-10-CM

## 2020-12-30 DIAGNOSIS — M65351 Trigger finger, right little finger: Secondary | ICD-10-CM

## 2020-12-30 NOTE — Assessment & Plan Note (Signed)
Chronic Stable No symptoms monitor

## 2020-12-30 NOTE — Assessment & Plan Note (Signed)
Chronic Check lipid panel  Continue atorvastatin 20 mg daily Regular exercise and healthy diet encouraged  

## 2020-12-30 NOTE — Assessment & Plan Note (Addendum)
Chronic Stable Following with nephrology Sugars well controlled BP well controlled

## 2020-12-30 NOTE — Assessment & Plan Note (Signed)
Chronic Well controlled Managed by Dr Dwyane Dee Lab Results  Component Value Date   HGBA1C 5.9 12/28/2020

## 2020-12-30 NOTE — Assessment & Plan Note (Signed)
Chronic Gout controlled - no flares since last visit Continue allopurinol 300 mg daily

## 2020-12-30 NOTE — Assessment & Plan Note (Signed)
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

## 2020-12-30 NOTE — Assessment & Plan Note (Signed)
Chronic Pain with certain activities Wants to avoid certain

## 2020-12-30 NOTE — Assessment & Plan Note (Signed)
Acute Mild Will monitor for now

## 2020-12-30 NOTE — Assessment & Plan Note (Signed)
Chronic  Euvolemic Follows with cardiology Monitors weight at home Continue lasix 40 mg qd, metoprolol 100 mg bid

## 2020-12-30 NOTE — Assessment & Plan Note (Signed)
Chronic Continue atorvastatin 20 mg daily Continue healthy diet and regular exercise LDL at goal

## 2020-12-30 NOTE — Assessment & Plan Note (Signed)
Chronic BP well controlled Continue hydralazine 50 mg bid, metoprolol 100 mg bid, lasix 40 mg qd cmp reviewed

## 2020-12-30 NOTE — Assessment & Plan Note (Signed)
Chronic Controlled, stable Continue flomax 0.4 mg daily  

## 2020-12-31 ENCOUNTER — Ambulatory Visit: Payer: Medicare PPO | Admitting: Endocrinology

## 2020-12-31 VITALS — BP 116/74 | HR 61 | Ht 68.0 in | Wt 218.0 lb

## 2020-12-31 DIAGNOSIS — E1169 Type 2 diabetes mellitus with other specified complication: Secondary | ICD-10-CM | POA: Diagnosis not present

## 2020-12-31 DIAGNOSIS — E669 Obesity, unspecified: Secondary | ICD-10-CM | POA: Diagnosis not present

## 2020-12-31 MED ORDER — GLIMEPIRIDE 2 MG PO TABS: 2.0000 mg | ORAL_TABLET | Freq: Every day | ORAL | 1 refills | Status: DC

## 2020-12-31 NOTE — Patient Instructions (Addendum)
Glimeperide 1/2 daily  Check blood sugars on waking up 2 -3days a week  Also check blood sugars about 2 hours after meals and do this after different meals by rotation  Recommended blood sugar levels on waking up are 90-120 and about 2 hours after meal is 130-160  Please bring your blood sugar monitor to each visit, thank you  Gym?

## 2020-12-31 NOTE — Progress Notes (Signed)
Patient ID: Noah Barnes, male   DOB: [birth year], 75 y.o.   MRN: [id number]    Reason for Appointment : Follow-up visit  History of Present Illness          Diagnosis: Type 2 diabetes mellitus, date of diagnosis: 2008       Past history: His diabetes was borderline in the beginning and not clear what medications he was started on. Has been taking metformin for several years and this was later changed to George. Amaryl probably started in 5/14 when blood sugars were higher, initially was given 1 mg and now has been taking 2 mg. He had a relatively high A1c of 7.1, difficulty losing weight and a glucose of 202 on his initial consultation in 06/2013 He was then tried on Invokana in addition to his Janumet and Amaryl Invokana was reduced to 100 mg in early 2015 been creatinine had gone up to 1.6  Recent history:   Oral hypoglycemic drugs the patient is taking are: Amaryl 4 mg at supper, Rybelsus 14 mg daily   His A1c is continuing to be relatively lower and now, previous range 5.8-6.6 Fructosamine last 241  Current management, blood sugar patterns and problems identified:    He has been continued on RYBELSUS 14 mg Although he had previously lost weight this has leveled off Again no side effects from this and is taking this in the morning as directed Instead of 7 mg on his last visit He is still not exercising as he has difficulty with his knees and still has not gone back to the gym Again maintaining a fairly good diet No hypoglycemia reported with lowest blood sugar 81 and averaging below 100 in the morning Postprandial readings are also consistently controlled and generally better with using Rybelsus  No nausea with Rybelsus  Side effects from medications have been:  renal dysfunction from Invokana  Glucose monitoring:  done <1 times a day         Glucometer:  Accu-Chek   Blood Glucose readings    PRE-MEAL Fasting Lunch Dinner Bedtime Overall  Glucose range:      81-177  Mean/median: 97    111   POST-MEAL PC Breakfast PC Lunch PC Dinner  Glucose range:     Mean/median:   132   Previous readings:  PRE-MEAL Fasting Lunch Dinner Bedtime Overall  Glucose range:     79-172  Mean/median:  97  107   127       Glycemic control:   Lab Results  Component Value Date   HGBA1C 5.9 12/28/2020   HGBA1C 6.1 08/30/2020   HGBA1C 6.8 (H) 05/17/2020   Lab Results  Component Value Date   MICROALBUR 9.8 (H) 08/30/2020   LDLCALC 73 12/28/2020   CREATININE 1.75 (H) 12/28/2020    Self-care: Usually has low-fat diet, eggs in am with some carbohydrate, frequently no lunch     Dietician consultations: 08/2013              Weight history:   Wt Readings from Last 3 Encounters:  12/31/20 218 lb (98.9 kg)  12/30/20 216 lb (98 kg)  12/29/20 218 lb 12.8 oz (99.2 kg)       Allergies as of 12/31/2020       Reactions   Ace Inhibitors Other (See Comments)   Severe AKI due to ARB + dehydration Aug 2016   Angiotensin Receptor Blockers Other (See Comments)   Severe AKI due to ARB + dehydration Aug  2016        Medication List        Accurate as of December 31, 2020  9:30 AM. If you have any questions, ask your nurse or doctor.          Accu-Chek Guide Me w/Device Kit 1 each by Does not apply route in the morning and at bedtime. Use Accu Chek Guide me device to check blood sugar twice daily.   Accu-Chek Guide test strip Generic drug: glucose blood TEST BLOOD SUGAR TWICE DAILY AS INSTRUCTED   Accu-Chek Softclix Lancets lancets CHECK BLOOD SUGAR TWICE DAILY AS DIRECTED   allopurinol 300 MG tablet Commonly known as: ZYLOPRIM Take 1 tablet by mouth once daily   apixaban 2.5 MG Tabs tablet Commonly known as: Eliquis Take 1 tablet (2.5 mg total) by mouth 2 (two) times daily.   atorvastatin 20 MG tablet Commonly known as: LIPITOR Take 1 tablet by mouth once daily   Ferrex 150 150 MG capsule Generic drug: iron polysaccharides Take 1 capsule  by mouth once daily   Fluticasone-Salmeterol 100-50 MCG/DOSE Aepb Commonly known as: ADVAIR Inhale 1 puff into the lungs 2 (two) times daily as needed (chest tightness).   furosemide 40 MG tablet Commonly known as: LASIX Take 40 mg by mouth daily.   glimepiride 4 MG tablet Commonly known as: AMARYL TAKE 1 TABLET BY MOUTH ONCE DAILY BEFORE SUPPER   hydrALAZINE 50 MG tablet Commonly known as: APRESOLINE Take 1 tablet by mouth twice daily   Linzess 290 MCG Caps capsule Generic drug: linaclotide TAKE 1 CAPSULE BY MOUTH ONCE DAILY BEFORE BREAKFAST   metoprolol tartrate 100 MG tablet Commonly known as: LOPRESSOR Take 1 tablet (100 mg total) by mouth 2 (two) times daily.   Rybelsus 14 MG Tabs Generic drug: Semaglutide TAKE 1 TABLET BY MOUTH ONCE DAILY 30  MINUTES  BEFORE  BREAKFAST  WITH  A  SIP  OF  WATER   sildenafil 100 MG tablet Commonly known as: VIAGRA TAKE 1 TABLET BY MOUTH EVERY DAY AS NEEDED FOR ERECTILE DYSFUNCTION   tamsulosin 0.4 MG Caps capsule Commonly known as: FLOMAX TAKE 1 CAPSULE BY MOUTH ONCE DAILY AFTER SUPPER        Allergies:  Allergies  Allergen Reactions   Ace Inhibitors Other (See Comments)    Severe AKI due to ARB + dehydration Aug 2016   Angiotensin Receptor Blockers Other (See Comments)    Severe AKI due to ARB + dehydration Aug 2016    Past Medical History:  Diagnosis Date   CHF (congestive heart failure) (HCC)    Chronic diastolic heart failure (HCC)    Constipation    Diabetes mellitus    Diverticulitis    Diverticulosis    ED (erectile dysfunction)    Gout    Heart murmur    Hyperlipidemia    Hypertension    IBS (irritable bowel syndrome)    Renal calculus    Renal insufficiency    Tubular adenoma of colon     Past Surgical History:  Procedure Laterality Date   COLONOSCOPY  1998   negative; no F/U   EXTRACORPOREAL SHOCK WAVE LITHOTRIPSY Left 03/20/2019   Procedure: EXTRACORPOREAL SHOCK WAVE LITHOTRIPSY (ESWL);   Surgeon: Cleon Gustin, MD;  Location: [facility];  Service: Urology;  Laterality: Left;   knee effusion tapped      Family History  Problem Relation Age of Onset   Heart attack Mother 64   Hypertension Mother    Prostate  cancer Father 28   Diabetes Father    Irritable bowel syndrome Father    Colon cancer Brother        dx in his late 64's   Irritable bowel syndrome Sister        x 2   Irritable bowel syndrome Brother    Stroke Neg Hx    Esophageal cancer Neg Hx    Rectal cancer Neg Hx    Stomach cancer Neg Hx     Social History:  reports that he quit smoking about 29 years ago. His smoking use included cigarettes. He smoked an average of 0.30 packs per day. He has never used smokeless tobacco. He reports current alcohol use of about 2.0 standard drinks of alcohol per week. He reports that he does not use drugs.    Review of Systems   RENAL dysfunction: Creatinine is as follows, followed by nephrologist  Lab Results  Component Value Date   CREATININE 1.75 (H) 12/28/2020   CREATININE 1.56 (H) 09/22/2020   CREATININE 1.47 08/30/2020   CREATININE 1.72 (H) 05/17/2020     Lab Results  Component Value Date   K 4.3 12/28/2020         Lipids: Has been on Lipitor 20 mg for hyperlipidemia, baseline LDL about 162 Followed by Dr. Quay Burow  Lab Results  Component Value Date   CHOL 132 12/28/2020   HDL 47.70 12/28/2020   LDLCALC 73 12/28/2020   LDLDIRECT 80.1 04/15/2014   TRIG 59.0 12/28/2020   CHOLHDL 3 12/28/2020       The blood pressure has been high for several years and usually well controlled.  Currently taking, hydralazine, and metoprolol from his PCP  Blood pressure at home has been monitored but he does not remember his readings.  Also followed by his PCP regularly   BP Readings from Last 3 Encounters:  12/31/20 116/74  12/30/20 122/80  12/29/20 118/80      LABS:  Appointment on 12/28/2020  Component Date Value Ref Range Status   TSH 12/28/2020  2.07  0.35 - 4.50 uIU/mL Final   Cholesterol 12/28/2020 132  0 - 200 mg/dL Final   ATP III Classification       Desirable:  < 200 mg/dL               Borderline High:  200 - 239 mg/dL          High:  > = 240 mg/dL   Triglycerides 12/28/2020 59.0  0.0 - 149.0 mg/dL Final   Normal:  <150 mg/dLBorderline High:  150 - 199 mg/dL   HDL 12/28/2020 47.70  >39.00 mg/dL Final   VLDL 12/28/2020 11.8  0.0 - 40.0 mg/dL Final   LDL Cholesterol 12/28/2020 73  0 - 99 mg/dL Final   Total CHOL/HDL Ratio 12/28/2020 3   Final                  Men          Women1/2 Average Risk     3.4          3.3Average Risk          5.0          4.42X Average Risk          9.6          7.13X Average Risk          15.0          11.0  NonHDL 12/28/2020 84.64   Final   NOTE:  Non-HDL goal should be 30 mg/dL higher than patient's LDL goal (i.e. LDL goal of < 70 mg/dL, would have non-HDL goal of < 100 mg/dL)   Hgb A1c MFr Bld 12/28/2020 5.9  4.6 - 6.5 % Final   Glycemic Control Guidelines for People with Diabetes:Non Diabetic:  <6%Goal of Therapy: <7%Additional Action Suggested:  >8%    Sodium 12/28/2020 138  135 - 145 mEq/L Final   Potassium 12/28/2020 4.3  3.5 - 5.1 mEq/L Final   Chloride 12/28/2020 112  96 - 112 mEq/L Final   CO2 12/28/2020 19  19 - 32 mEq/L Final   Glucose, Bld 12/28/2020 101 (A) 70 - 99 mg/dL Final   BUN 12/28/2020 34 (A) 6 - 23 mg/dL Final   Creatinine, Ser 12/28/2020 1.75 (A) 0.40 - 1.50 mg/dL Final   Total Bilirubin 12/28/2020 0.6  0.2 - 1.2 mg/dL Final   Alkaline Phosphatase 12/28/2020 71  39 - 117 U/L Final   AST 12/28/2020 23  0 - 37 U/L Final   ALT 12/28/2020 63 (A) 0 - 53 U/L Final   Total Protein 12/28/2020 6.6  6.0 - 8.3 g/dL Final   Albumin 12/28/2020 4.0  3.5 - 5.2 g/dL Final   GFR 12/28/2020 38.02 (A) >60.00 mL/min Final   Calculated using the CKD-EPI Creatinine Equation (2021)   Calcium 12/28/2020 9.4  8.4 - 10.5 mg/dL Final   WBC 12/28/2020 4.1  4.0 - 10.5 K/uL Final    RBC 12/28/2020 4.85  4.22 - 5.81 Mil/uL Final   Hemoglobin 12/28/2020 13.1  13.0 - 17.0 g/dL Final   HCT 12/28/2020 40.8  39.0 - 52.0 % Final   MCV 12/28/2020 84.2  78.0 - 100.0 fl Final   MCHC 12/28/2020 32.1  30.0 - 36.0 g/dL Final   RDW 12/28/2020 15.2  11.5 - 15.5 % Final   Platelets 12/28/2020 200.0  150.0 - 400.0 K/uL Final   Neutrophils Relative % 12/28/2020 61.1  43.0 - 77.0 % Final   Lymphocytes Relative 12/28/2020 26.5  12.0 - 46.0 % Final   Monocytes Relative 12/28/2020 9.1  3.0 - 12.0 % Final   Eosinophils Relative 12/28/2020 2.4  0.0 - 5.0 % Final   Basophils Relative 12/28/2020 0.9  0.0 - 3.0 % Final   Neutro Abs 12/28/2020 2.5  1.4 - 7.7 K/uL Final   Lymphs Abs 12/28/2020 1.1  0.7 - 4.0 K/uL Final   Monocytes Absolute 12/28/2020 0.4  0.1 - 1.0 K/uL Final   Eosinophils Absolute 12/28/2020 0.1  0.0 - 0.7 K/uL Final   Basophils Absolute 12/28/2020 0.0  0.0 - 0.1 K/uL Final    Physical Examination:  BP 116/74   Pulse 61   Ht _0  (1.727 m)   Wt 218 lb (98.9 kg)   SpO2 96%   BMI 33.15 kg/m      ASSESSMENT/PLAN:   Diabetes type 2, with obesity  See history of present illness for detailed discussion of current diabetes management, blood sugar patterns and problems identified  He is on Rybelsus 14 mg and Amaryl 4 mg  A1c is 5.9 and consistently improved  Blood sugars are excellent with his regimen of 14 mg of Rybelsus along with Amaryl Also checking blood sugars more consistently including in the morning or after dinner Previously had lost weight but this has leveled off  Since he has had blood sugars as low as 81 we will reduce his Amaryl to 2 mg instead  of 4 mg, new prescription sent Reminded him to exercise regularly to improve weight further, he plans to go back to the gym soon  RENAL dysfunction: His creatinine is fluctuating, to be followed by nephrologist   There are no Patient Instructions on file for this visit.    Elayne Snare 12/31/2020, 9:30  AM

## 2021-01-06 ENCOUNTER — Other Ambulatory Visit: Payer: Self-pay | Admitting: Internal Medicine

## 2021-01-11 NOTE — Progress Notes (Signed)
Cardiology Office Note:    Date:  01/13/2021   ID:  Noah Barnes, DOB 02/04/1946, MRN [id number]  PCP:  Binnie Rail, MD  [facility] Cardiologist:  Sherren Mocha, MD  [facility] Electrophysiologist:  None   Chief Complaint: 2.5 yr follow up   History of Present Illness:    Noah Barnes is a 75 y.o. 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  He was admitted in 2016 with acute renal failure.  An echocardiogram during that admission demonstrated normal LV function with inferior hypokinesis.  Follow-up nuclear stress test in 5/17 demonstrated inferior wall defect.  There was no ischemia.  He has been unable to tolerate ACE or ARB secondary to history of severe acute kidney injury on these agents.  Last echocardiogram September 2020 with LV function of 60 to 65%, mild LVH, impaired diastolic dysfunction.  Patient is here for long due follow-up.  No cardiac complaints.  He has chronic stable mild lower extremity edema.  Denies chest pain, shortness of breath, orthopnea PND, syncope, melena, dizziness or palpitation.  Reports compliance with low-sodium diet.  Past Medical History:  Diagnosis Date   CHF (congestive heart failure) (HCC)    Chronic diastolic heart failure (HCC)    Constipation    Diabetes mellitus    Diverticulitis    Diverticulosis    ED (erectile dysfunction)    Gout    Heart murmur    Hyperlipidemia    Hypertension    IBS (irritable bowel syndrome)    Renal calculus    Renal insufficiency    Tubular adenoma of colon     Past Surgical History:  Procedure Laterality Date   COLONOSCOPY  1998   negative; no F/U   EXTRACORPOREAL SHOCK WAVE LITHOTRIPSY Left 03/20/2019   Procedure:  EXTRACORPOREAL SHOCK WAVE LITHOTRIPSY (ESWL);  Surgeon: Cleon Gustin, MD;  Location: [facility];  Service: Urology;  Laterality: Left;   knee effusion tapped      Current Medications: Current Meds  Medication Sig   Accu-Chek Softclix Lancets lancets CHECK BLOOD SUGAR TWICE DAILY AS DIRECTED   allopurinol (ZYLOPRIM) 300 MG tablet Take 1 tablet by mouth once daily   apixaban (ELIQUIS) 2.5 MG TABS tablet Take 1 tablet (2.5 mg total) by mouth 2 (two) times daily.   atorvastatin (LIPITOR) 20 MG tablet Take 1 tablet by mouth once daily   Blood Glucose Monitoring Suppl (ACCU-CHEK GUIDE ME) w/Device KIT 1 each by Does not apply route in the morning and at bedtime. Use Accu Chek Guide me device to check blood sugar twice daily.   FERREX 150 150 MG capsule Take 1 capsule by mouth once daily   Fluticasone-Salmeterol (ADVAIR) 100-50 MCG/DOSE AEPB Inhale 1 puff into the lungs 2 (two) times daily as needed (chest tightness).   furosemide (LASIX) 40 MG tablet Take 40 mg by mouth daily.   glimepiride (AMARYL) 2 MG tablet Take 1 tablet (2 mg total) by mouth daily before supper.   glucose blood (ACCU-CHEK GUIDE) test strip TEST BLOOD SUGAR TWICE DAILY AS INSTRUCTED   hydrALAZINE (APRESOLINE) 50 MG tablet Take 1 tablet by mouth twice daily   LINZESS 290 MCG CAPS capsule TAKE 1 CAPSULE BY MOUTH ONCE DAILY BEFORE BREAKFAST   metoprolol tartrate (LOPRESSOR) 100  MG tablet Take 1 tablet (100 mg total) by mouth 2 (two) times daily.   Semaglutide (RYBELSUS) 14 MG TABS TAKE 1 TABLET BY MOUTH ONCE DAILY 30  MINUTES  BEFORE  BREAKFAST  WITH  A  SIP  OF  WATER   sildenafil (VIAGRA) 100 MG tablet TAKE 1 TABLET BY MOUTH EVERY DAY AS NEEDED FOR ERECTILE DYSFUNCTION   tamsulosin (FLOMAX) 0.4 MG CAPS capsule TAKE 1 CAPSULE BY MOUTH ONCE DAILY AFTER SUPPER     Allergies:   Ace inhibitors and Angiotensin receptor blockers   Social History   Socioeconomic History   Marital status: Married    Spouse name: Not on file    Number of children: 1   Years of education: Not on file   Highest education level: Not on file  Occupational History   Occupation: Metallurgist: [employer]    Comment: [phone]  Tobacco Use   Smoking status: Former    Packs/day: 0.30    Types: Cigarettes    Quit date: 07/04/1991    Years since quitting: 29.5   Smokeless tobacco: Never   Tobacco comments:    smoked age [age], up to 1/3 ppd; may be less  Vaping Use   Vaping Use: Never used  Substance and Sexual Activity   Alcohol use: Yes    Alcohol/week: 2.0 standard drinks    Types: 2 Glasses of wine per week    Comment: rare   Drug use: No   Sexual activity: Yes  Other Topics Concern   Not on file  Social History Narrative   Not on file   Social Determinants of Health   Financial Resource Strain: Low Risk    Difficulty of Paying Living Expenses: Not hard at all  Food Insecurity: No Food Insecurity   Worried About Charity fundraiser in the Last Year: Never true   [place] in the Last Year: Never true  Transportation Needs: No Transportation Needs   Lack of Transportation (Medical): No   Lack of Transportation (Non-Medical): No  Physical Activity: Inactive   Days of Exercise per Week: 0 days   Minutes of Exercise per Session: 0 min  Stress: No Stress Concern Present   Feeling of Stress : Not at all  Social Connections: Socially Integrated   Frequency of Communication with Friends and Family: More than three times a week   Frequency of Social Gatherings with Friends and Family: More than three times a week   Attends Religious Services: More than 4 times per year   Active Member of [employer] or Organizations: Yes   Attends Music therapist: More than 4 times per year   Marital Status: Married     Family History: The patient's family history includes Colon cancer in his brother; Diabetes in his father; Heart attack (age of onset: 49) in his mother; Hypertension in his  mother; Irritable bowel syndrome in his brother, father, and sister; Prostate cancer (age of onset: 83) in his father. There is no history of Stroke, Esophageal cancer, Rectal cancer, or Stomach cancer.    ROS:   Please see the history of present illness.    All other systems reviewed and are negative.   EKGs/Labs/Other Studies Reviewed:    The following studies were reviewed today:  Echo 03/2019 1. Left ventricular ejection fraction, by visual estimation, is 60 to  65%. The left ventricle has normal function. Normal left ventricular size.  There is mildly increased left ventricular hypertrophy.   2. Left ventricular diastolic Doppler parameters are consistent with  impaired relaxation pattern of LV diastolic filling.   3. Global right ventricle has normal systolic function.The right  ventricular size is normal. No increase in right ventricular wall  thickness.   4. Left atrial size was normal.   5. Right atrial size was normal.   6. The mitral valve is normal in structure. No evidence of mitral valve  regurgitation. No evidence of mitral stenosis.   7. The tricuspid valve is normal in structure. Tricuspid valve  regurgitation is mild.   8. The aortic valve is tricuspid Aortic valve regurgitation is trivial by  color flow Doppler. Mild aortic valve sclerosis without stenosis.   9. The pulmonic valve was normal in structure. Pulmonic valve  regurgitation is trivial by color flow Doppler.  10. Severely elevated pulmonary artery systolic pressure.  11. The inferior vena cava is normal in size with greater than 50%  respiratory variability, suggesting right atrial pressure of 3 mmHg.   EKG:  EKG is ordered today.  The ekg ordered today demonstrates normal sinus rhythm Recent Labs: 12/28/2020: ALT 63; BUN 34; Creatinine, Ser 1.75; Hemoglobin 13.1; Platelets 200.0; Potassium 4.3; Sodium 138; TSH 2.07  Recent Lipid Panel    Component Value Date/Time   CHOL 132 12/28/2020 0925   TRIG  59.0 12/28/2020 0925   HDL 47.70 12/28/2020 0925   CHOLHDL 3 12/28/2020 0925   VLDL 11.8 12/28/2020 0925   LDLCALC 73 12/28/2020 0925   LDLDIRECT 80.1 04/15/2014 0953    Physical Exam:    VS:  BP 124/78   Pulse (!) 59   Ht '5\' 8"'  (1.727 m)   Wt 218 lb (98.9 kg)   BMI 33.15 kg/m     Wt Readings from Last 3 Encounters:  01/13/21 218 lb (98.9 kg)  12/31/20 218 lb (98.9 kg)  12/30/20 216 lb (98 kg)     GEN: Well nourished, well developed in no acute distress HEENT: Normal NECK: No JVD; No carotid bruits LYMPHATICS: No lymphadenopathy CARDIAC: RRR, no murmurs, rubs, gallops RESPIRATORY:  Clear to auscultation without rales, wheezing or rhonchi  ABDOMEN: Soft, non-tender, non-distended MUSCULOSKELETAL: Trace bilateral edema; No deformity  SKIN: Warm and dry NEUROLOGIC:  Alert and oriented x 3 PSYCHIATRIC:  Normal affect   ASSESSMENT AND PLAN:    Chronic diastolic CHF Patient has chronic mild lower extremity edema in setting of bilateral DVT chronically.  He is on Eliquis.  Continue Lasix 40 mg daily.  Encourage leg elevation and compression stocking.  Advised to further cut back on salt.  He may try extra Lasix.  As needed.  2. HTN Blood pressure stable and controlled on current medication  3. HLD -12/28/2020: Cholesterol 132; HDL 47.70; LDL Cholesterol 73; Triglycerides 59.0; VLDL 11.8  -Followed by PCP -Continue statin  4.  CKD stage III -Followed by PCP and nephrology   Medication Adjustments/Labs and Tests Ordered: Current medicines are reviewed at length with the patient today.  Concerns regarding medicines are outlined above.  Orders Placed This Encounter  Procedures   EKG 12-Lead   No orders of the defined types were placed in this encounter.   Patient Instructions  Medication Instructions:  Your physician recommends that you continue on your current medications as directed. Please refer to the Current Medication list given to you today.  *If you need  a refill on your cardiac medications before your next appointment, please call your  pharmacy*   Lab Work: None ordered  If you have labs (blood work) drawn today and your tests are completely normal, you will receive your results only by: [place] (if you have MyChart) OR A paper copy in the mail If you have any lab test that is abnormal or we need to change your treatment, we will call you to review the results.   Testing/Procedures: None ordered   Follow-Up: At [facility], you and your health needs are our priority.  As part of our continuing mission to provide you with exceptional heart care, we have created designated Provider Care Teams.  These Care Teams include your primary Cardiologist (physician) and Advanced Practice Providers (APPs -  Physician Assistants and Nurse Practitioners) who all work together to provide you with the care you need, when you need it.  We recommend signing up for the patient portal called "MyChart".  Sign up information is provided on this After Visit Summary.  MyChart is used to connect with patients for Virtual Visits (Telemedicine).  Patients are able to view lab/test results, encounter notes, upcoming appointments, etc.  Non-urgent messages can be sent to your provider as well.   To learn more about what you can do with MyChart, go to [email or web address].    Your next appointment:   12 month(s)  The format for your next appointment:   In Person  Provider:   You may see Sherren Mocha, MD or one of the following Advanced Practice Providers on your designated Care Team:   Richardson Dopp, PA-C Robbie Lis, PA-C   Other Instructions    Signed, Leanor Kail, [place]  01/13/2021 10:50 AM    [place]

## 2021-01-13 ENCOUNTER — Ambulatory Visit: Payer: Medicare PPO | Admitting: Physician Assistant

## 2021-01-13 ENCOUNTER — Encounter: Payer: Self-pay | Admitting: Physician Assistant

## 2021-01-13 ENCOUNTER — Other Ambulatory Visit: Payer: Self-pay

## 2021-01-13 VITALS — BP 124/78 | HR 59 | Ht 68.0 in | Wt 218.0 lb

## 2021-01-13 DIAGNOSIS — Z86711 Personal history of pulmonary embolism: Secondary | ICD-10-CM

## 2021-01-13 DIAGNOSIS — I10 Essential (primary) hypertension: Secondary | ICD-10-CM | POA: Diagnosis not present

## 2021-01-13 DIAGNOSIS — E782 Mixed hyperlipidemia: Secondary | ICD-10-CM | POA: Diagnosis not present

## 2021-01-13 DIAGNOSIS — N183 Chronic kidney disease, stage 3 unspecified: Secondary | ICD-10-CM | POA: Diagnosis not present

## 2021-01-13 DIAGNOSIS — Z86718 Personal history of other venous thrombosis and embolism: Secondary | ICD-10-CM

## 2021-01-13 DIAGNOSIS — I700 Atherosclerosis of aorta: Secondary | ICD-10-CM

## 2021-01-13 DIAGNOSIS — I5032 Chronic diastolic (congestive) heart failure: Secondary | ICD-10-CM

## 2021-01-13 NOTE — Patient Instructions (Signed)
Medication Instructions:  Your physician recommends that you continue on your current medications as directed. Please refer to the Current Medication list given to you today.]  *If you need a refill on your cardiac medications before your next appointment, please call your pharmacy*   Lab Work: None ordered  If you have labs (blood work) drawn today and your tests are completely normal, you will receive your results only by: . MyChart Message (if you have MyChart) OR . A paper copy in the mail If you have any lab test that is abnormal or we need to change your treatment, we will call you to review the results.   Testing/Procedures: None ordered   Follow-Up: At [facility], you and your health needs are our priority.  As part of our continuing mission to provide you with exceptional heart care, we have created designated Provider Care Teams.  These Care Teams include your primary Cardiologist (physician) and Advanced Practice Providers (APPs -  Physician Assistants and Nurse Practitioners) who all work together to provide you with the care you need, when you need it.  We recommend signing up for the patient portal called "MyChart".  Sign up information is provided on this After Visit Summary.  MyChart is used to connect with patients for Virtual Visits (Telemedicine).  Patients are able to view lab/test results, encounter notes, upcoming appointments, etc.  Non-urgent messages can be sent to your provider as well.   To learn more about what you can do with MyChart, go to [email or web address].    Your next appointment:   12 month(s)  The format for your next appointment:   In Person  Provider:   You may see Michael Cooper, MD or one of the following Advanced Practice Providers on your designated Care Team:    Scott Weaver, PA-C  Vin Bhagat, PA-C    Other Instructions  

## 2021-01-14 ENCOUNTER — Encounter: Payer: Self-pay | Admitting: Hematology

## 2021-01-14 ENCOUNTER — Ambulatory Visit: Payer: Medicare PPO | Attending: Internal Medicine

## 2021-01-14 ENCOUNTER — Other Ambulatory Visit: Payer: Self-pay | Admitting: Internal Medicine

## 2021-01-14 ENCOUNTER — Other Ambulatory Visit (HOSPITAL_BASED_OUTPATIENT_CLINIC_OR_DEPARTMENT_OTHER): Payer: Self-pay

## 2021-01-14 DIAGNOSIS — Z23 Encounter for immunization: Secondary | ICD-10-CM

## 2021-01-14 MED ORDER — PFIZER-BIONT COVID-19 VAC-TRIS 30 MCG/0.3ML IM SUSP
INTRAMUSCULAR | 0 refills | Status: DC
  Filled 2021-01-14: qty 0.3, 1d supply, fill #0

## 2021-01-14 NOTE — Progress Notes (Signed)
   Covid-19 Vaccination Clinic  Name:  Noah Barnes    MRN: [id number] DOB: 08/09/1945  01/14/2021  [name] was observed post Covid-19 immunization for 15 minutes without incident. He was provided with Vaccine Information Sheet and instruction to access the V-Safe system.   [name] was instructed to call 911 with any severe reactions post vaccine: Difficulty breathing  Swelling of face and throat  A fast heartbeat  A bad rash all over body  Dizziness and weakness   Immunizations Administered     Name Date Dose VIS Date Route   PFIZER Comrnaty(Gray TOP) Covid-19 Vaccine 01/14/2021 11:14 AM 0.3 mL 06/10/2020 Intramuscular   Manufacturer: [place]   Lot: [id number]   [place]: [phone]

## 2021-01-18 DIAGNOSIS — H2513 Age-related nuclear cataract, bilateral: Secondary | ICD-10-CM | POA: Diagnosis not present

## 2021-01-18 DIAGNOSIS — E119 Type 2 diabetes mellitus without complications: Secondary | ICD-10-CM | POA: Diagnosis not present

## 2021-01-18 DIAGNOSIS — H524 Presbyopia: Secondary | ICD-10-CM | POA: Diagnosis not present

## 2021-01-18 DIAGNOSIS — H35033 Hypertensive retinopathy, bilateral: Secondary | ICD-10-CM | POA: Diagnosis not present

## 2021-01-18 DIAGNOSIS — H52223 Regular astigmatism, bilateral: Secondary | ICD-10-CM | POA: Diagnosis not present

## 2021-01-18 DIAGNOSIS — H5203 Hypermetropia, bilateral: Secondary | ICD-10-CM | POA: Diagnosis not present

## 2021-01-18 LAB — HM DIABETES EYE EXAM

## 2021-01-22 ENCOUNTER — Other Ambulatory Visit: Payer: Self-pay | Admitting: Endocrinology

## 2021-02-01 ENCOUNTER — Other Ambulatory Visit: Payer: Self-pay | Admitting: Internal Medicine

## 2021-02-09 ENCOUNTER — Other Ambulatory Visit: Payer: Self-pay | Admitting: Hematology & Oncology

## 2021-02-10 ENCOUNTER — Other Ambulatory Visit: Payer: Self-pay | Admitting: Internal Medicine

## 2021-03-04 ENCOUNTER — Other Ambulatory Visit: Payer: Self-pay | Admitting: Internal Medicine

## 2021-03-10 ENCOUNTER — Encounter: Payer: Self-pay | Admitting: Internal Medicine

## 2021-03-10 NOTE — Progress Notes (Signed)
Outside notes received. Information abstracted. Notes sent to scan.  

## 2021-03-14 ENCOUNTER — Other Ambulatory Visit: Payer: Self-pay | Admitting: Hematology & Oncology

## 2021-03-17 ENCOUNTER — Other Ambulatory Visit: Payer: Self-pay | Admitting: Internal Medicine

## 2021-03-17 DIAGNOSIS — D649 Anemia, unspecified: Secondary | ICD-10-CM | POA: Diagnosis not present

## 2021-03-17 DIAGNOSIS — E261 Secondary hyperaldosteronism: Secondary | ICD-10-CM | POA: Diagnosis not present

## 2021-03-17 DIAGNOSIS — I110 Hypertensive heart disease with heart failure: Secondary | ICD-10-CM | POA: Diagnosis not present

## 2021-03-17 DIAGNOSIS — J42 Unspecified chronic bronchitis: Secondary | ICD-10-CM | POA: Diagnosis not present

## 2021-03-17 DIAGNOSIS — E119 Type 2 diabetes mellitus without complications: Secondary | ICD-10-CM | POA: Diagnosis not present

## 2021-03-17 DIAGNOSIS — K581 Irritable bowel syndrome with constipation: Secondary | ICD-10-CM | POA: Diagnosis not present

## 2021-03-17 DIAGNOSIS — E785 Hyperlipidemia, unspecified: Secondary | ICD-10-CM | POA: Diagnosis not present

## 2021-03-17 DIAGNOSIS — E669 Obesity, unspecified: Secondary | ICD-10-CM | POA: Diagnosis not present

## 2021-03-17 DIAGNOSIS — I509 Heart failure, unspecified: Secondary | ICD-10-CM | POA: Diagnosis not present

## 2021-03-24 ENCOUNTER — Other Ambulatory Visit: Payer: Self-pay | Admitting: Endocrinology

## 2021-03-25 ENCOUNTER — Inpatient Hospital Stay: Payer: Medicare PPO | Attending: Hematology & Oncology

## 2021-03-25 ENCOUNTER — Inpatient Hospital Stay (HOSPITAL_BASED_OUTPATIENT_CLINIC_OR_DEPARTMENT_OTHER): Payer: Medicare PPO | Admitting: Family

## 2021-03-25 ENCOUNTER — Encounter: Payer: Self-pay | Admitting: Family

## 2021-03-25 ENCOUNTER — Other Ambulatory Visit: Payer: Self-pay

## 2021-03-25 VITALS — BP 105/76 | HR 58 | Temp 98.1°F | Resp 17 | Wt 226.4 lb

## 2021-03-25 DIAGNOSIS — I82403 Acute embolism and thrombosis of unspecified deep veins of lower extremity, bilateral: Secondary | ICD-10-CM | POA: Insufficient documentation

## 2021-03-25 DIAGNOSIS — Z86711 Personal history of pulmonary embolism: Secondary | ICD-10-CM

## 2021-03-25 DIAGNOSIS — Z86718 Personal history of other venous thrombosis and embolism: Secondary | ICD-10-CM

## 2021-03-25 DIAGNOSIS — Z7901 Long term (current) use of anticoagulants: Secondary | ICD-10-CM | POA: Insufficient documentation

## 2021-03-25 DIAGNOSIS — I2699 Other pulmonary embolism without acute cor pulmonale: Secondary | ICD-10-CM | POA: Insufficient documentation

## 2021-03-25 LAB — CMP (CANCER CENTER ONLY)
ALT: 58 U/L — ABNORMAL HIGH (ref 0–44)
AST: 20 U/L (ref 15–41)
Albumin: 4.1 g/dL (ref 3.5–5.0)
Alkaline Phosphatase: 67 U/L (ref 38–126)
Anion gap: 8 (ref 5–15)
BUN: 28 mg/dL — ABNORMAL HIGH (ref 8–23)
CO2: 21 mmol/L — ABNORMAL LOW (ref 22–32)
Calcium: 9.3 mg/dL (ref 8.9–10.3)
Chloride: 109 mmol/L (ref 98–111)
Creatinine: 1.47 mg/dL — ABNORMAL HIGH (ref 0.61–1.24)
GFR, Estimated: 50 mL/min — ABNORMAL LOW (ref >60)
Glucose, Bld: 100 mg/dL — ABNORMAL HIGH (ref 70–99)
Potassium: 4.4 mmol/L (ref 3.5–5.1)
Sodium: 138 mmol/L (ref 135–145)
Total Bilirubin: 0.7 mg/dL (ref 0.3–1.2)
Total Protein: 6.6 g/dL (ref 6.5–8.1)

## 2021-03-25 LAB — CBC WITH DIFFERENTIAL (CANCER CENTER ONLY)
Abs Immature Granulocytes: 0.01 10*3/uL (ref 0.00–0.07)
Basophils Absolute: 0 10*3/uL (ref 0.0–0.1)
Basophils Relative: 1 %
Eosinophils Absolute: 0.1 10*3/uL (ref 0.0–0.5)
Eosinophils Relative: 3 %
HCT: 41 % (ref 39.0–52.0)
Hemoglobin: 13.2 g/dL (ref 13.0–17.0)
Immature Granulocytes: 0 %
Lymphocytes Relative: 32 %
Lymphs Abs: 1.3 10*3/uL (ref 0.7–4.0)
MCH: 27.4 pg (ref 26.0–34.0)
MCHC: 32.2 g/dL (ref 30.0–36.0)
MCV: 85.1 fL (ref 80.0–100.0)
Monocytes Absolute: 0.4 10*3/uL (ref 0.1–1.0)
Monocytes Relative: 11 %
Neutro Abs: 2.2 10*3/uL (ref 1.7–7.7)
Neutrophils Relative %: 53 %
Platelet Count: 206 10*3/uL (ref 150–400)
RBC: 4.82 MIL/uL (ref 4.22–5.81)
RDW: 14.1 % (ref 11.5–15.5)
WBC Count: 4.1 10*3/uL (ref 4.0–10.5)
nRBC: 0 % (ref 0.0–0.2)

## 2021-03-25 NOTE — Progress Notes (Signed)
Hematology and Oncology Follow Up Visit  Noah Barnes [id number] [birth year] 75 y.o. 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 twice daily as long-term maintenance   Interim History:  Noah Barnes is here today for follow-up. He is doing well and has no complaints at this time.  He is doing well on maintenance Eliquis. No bleeding, bruising or petechiae.  No fever, chills, n/v, cough, rash, dizziness, SOB, chest pain, palpitations, abdominal pain or changes in bowel or bladder habits.  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  No swelling, tenderness, numbness or tingling in his extremities. He has occasional puffiness in his ankles that resolves with propping up his feet.  No falls or syncope to report.  He has maintained a good appetite and is staying well hydrated. His weight is stable at 222 lbs.   ECOG Performance Status: 1 - Symptomatic but completely ambulatory  Medications:  Allergies as of 03/25/2021       Reactions   Ace Inhibitors Other (See Comments)   Severe AKI due to ARB + dehydration Aug 2016   Angiotensin Receptor Blockers Other (See Comments)   Severe AKI due to ARB + dehydration Aug 2016        Medication List        Accurate as of March 25, 2021 11:42 AM. If you have any questions, ask your nurse or doctor.          Accu-Chek Guide Me w/Device Kit 1 each by Does not apply route in the morning and at bedtime. Use Accu Chek Guide me device to check blood sugar twice daily.   Accu-Chek Guide test strip Generic drug: glucose blood TEST BLOOD SUGAR TWICE DAILY AS INSTRUCTED   Accu-Chek Softclix Lancets lancets CHECK BLOOD SUGAR TWICE DAILY AS DIRECTED   allopurinol 300 MG tablet Commonly known as: ZYLOPRIM Take 1 tablet by mouth once daily   atorvastatin 20 MG tablet Commonly known as: LIPITOR Take 1  tablet by mouth once daily   Eliquis 2.5 MG Tabs tablet Generic drug: apixaban Take 1 tablet by mouth twice daily   Ferrex 150 150 MG capsule Generic drug: iron polysaccharides Take 1 capsule by mouth once daily   Fluticasone-Salmeterol 100-50 MCG/DOSE Aepb Commonly known as: ADVAIR Inhale 1 puff into the lungs 2 (two) times daily as needed (chest tightness).   furosemide 40 MG tablet Commonly known as: LASIX Take 40 mg by mouth daily.   glimepiride 2 MG tablet Commonly known as: AMARYL Take 1 tablet (2 mg total) by mouth daily before supper.   hydrALAZINE 50 MG tablet Commonly known as: APRESOLINE Take 1 tablet by mouth twice daily   Linzess 290 MCG Caps capsule Generic drug: linaclotide TAKE 1 CAPSULE BY MOUTH ONCE DAILY BEFORE BREAKFAST   metoprolol tartrate 100 MG tablet Commonly known as: LOPRESSOR Take 1 tablet by mouth twice daily   Pfizer-BioNT COVID-19 Vac-TriS Susp injection Generic drug: COVID-19 mRNA Vac-TriS (Pfizer) Inject into the muscle.   Rybelsus 14 MG Tabs Generic drug: Semaglutide TAKE 1 TABLET BY MOUTH ONCE DAILY BEFORE BREAKFAST TAKE  30  MINUTES  BEFORE  BREAKFAST  WITH  A  SIP  OF  WATER   sildenafil 100 MG tablet Commonly known as: VIAGRA TAKE 1 TABLET BY MOUTH EVERY DAY AS NEEDED FOR ERECTILE DYSFUNCTION   tamsulosin 0.4 MG  Caps capsule Commonly known as: FLOMAX TAKE 1 CAPSULE BY MOUTH ONCE DAILY AFTER SUPPER        Allergies:  Allergies  Allergen Reactions   Ace Inhibitors Other (See Comments)    Severe AKI due to ARB + dehydration Aug 2016   Angiotensin Receptor Blockers Other (See Comments)    Severe AKI due to ARB + dehydration Aug 2016    Past Medical History, Surgical history, Social history, and Family History were reviewed and updated.  Review of Systems: All other 10 point review of systems is negative.   Physical Exam:  vitals were not taken for this visit.   Wt Readings from Last 3 Encounters:  01/13/21 218  lb (98.9 kg)  12/31/20 218 lb (98.9 kg)  12/30/20 216 lb (98 kg)    Ocular: Sclerae unicteric, pupils equal, round and reactive to light Ear-nose-throat: Oropharynx clear, dentition fair Lymphatic: No cervical or supraclavicular adenopathy Lungs no rales or rhonchi, good excursion bilaterally Heart regular rate and rhythm, no murmur appreciated Abd soft, nontender, positive bowel sounds MSK no focal spinal tenderness, no joint edema Neuro: non-focal, well-oriented, appropriate affect Breasts: Deferred   Lab Results  Component Value Date   WBC 4.1 03/25/2021   HGB 13.2 03/25/2021   HCT 41.0 03/25/2021   MCV 85.1 03/25/2021   PLT 206 03/25/2021   Lab Results  Component Value Date   FERRITIN 211 03/28/2019   IRON 29 (L) 03/28/2019   TIBC 216 (L) 03/28/2019   UIBC 187 03/28/2019   IRONPCTSAT 13 (L) 03/28/2019   Lab Results  Component Value Date   RETICCTPCT 1.2 03/28/2019   RBC 4.82 03/25/2021   No results found for: KPAFRELGTCHN, LAMBDASER, KAPLAMBRATIO No results found for: IGGSERUM, IGA, IGMSERUM No results found for: Odetta Pink, SPEI   Chemistry      Component Value Date/Time   NA 138 12/28/2020 0925   K 4.3 12/28/2020 0925   CL 112 12/28/2020 0925   CO2 19 12/28/2020 0925   BUN 34 (H) 12/28/2020 0925   CREATININE 1.75 (H) 12/28/2020 0925   CREATININE 1.56 (H) 09/22/2020 1131      Component Value Date/Time   CALCIUM 9.4 12/28/2020 0925   ALKPHOS 71 12/28/2020 0925   AST 23 12/28/2020 0925   AST 20 09/22/2020 1131   ALT 63 (H) 12/28/2020 0925   ALT 51 (H) 09/22/2020 1131   BILITOT 0.6 12/28/2020 0925   BILITOT 0.6 09/22/2020 1131       Impression and Plan:  Noah Barnes is a very pleasant 75 yo African American gentleman with 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 He completed 6 months of full anticoagulation and is now doing well on maintenance low dose Eliquis lifelong.  He will continue his  same regimen with Eliquis. Follow-up in 6 months.  He can contact our office with any questions or concerns.   Lottie Dawson, NP 9/23/202211:42 AM

## 2021-03-26 ENCOUNTER — Ambulatory Visit (INDEPENDENT_AMBULATORY_CARE_PROVIDER_SITE_OTHER): Payer: Medicare PPO

## 2021-03-26 DIAGNOSIS — Z23 Encounter for immunization: Secondary | ICD-10-CM

## 2021-04-05 ENCOUNTER — Other Ambulatory Visit: Payer: Self-pay | Admitting: Internal Medicine

## 2021-04-07 ENCOUNTER — Other Ambulatory Visit: Payer: Self-pay | Admitting: Internal Medicine

## 2021-04-12 ENCOUNTER — Other Ambulatory Visit: Payer: Self-pay | Admitting: Hematology & Oncology

## 2021-04-15 ENCOUNTER — Telehealth (INDEPENDENT_AMBULATORY_CARE_PROVIDER_SITE_OTHER): Payer: Medicare PPO | Admitting: Nurse Practitioner

## 2021-04-15 ENCOUNTER — Encounter: Payer: Self-pay | Admitting: Nurse Practitioner

## 2021-04-15 ENCOUNTER — Other Ambulatory Visit: Payer: Self-pay

## 2021-04-15 ENCOUNTER — Telehealth: Payer: Self-pay | Admitting: Internal Medicine

## 2021-04-15 DIAGNOSIS — R0989 Other specified symptoms and signs involving the circulatory and respiratory systems: Secondary | ICD-10-CM | POA: Insufficient documentation

## 2021-04-15 DIAGNOSIS — R0789 Other chest pain: Secondary | ICD-10-CM | POA: Diagnosis not present

## 2021-04-15 MED ORDER — GUAIFENESIN ER 600 MG PO TB12: 600.0000 mg | ORAL_TABLET | Freq: Two times a day (BID) | ORAL | 0 refills | Status: DC

## 2021-04-15 MED ORDER — ALBUTEROL SULFATE HFA 108 (90 BASE) MCG/ACT IN AERS
2.0000 | INHALATION_SPRAY | Freq: Four times a day (QID) | RESPIRATORY_TRACT | 0 refills | Status: DC | PRN
Start: 2021-04-15 — End: 2023-10-09

## 2021-04-15 NOTE — Assessment & Plan Note (Signed)
Describes some chest tightness.  Does have a prescription for Advair inhaler 2 times daily as needed for chest tightness.  Patient does not have an albuterol inhaler.  We will send an albuterol inhaler to see if this helps with some of the chest tightness we will continue to monitor and start antibiotics if no improvement by Monday.

## 2021-04-15 NOTE — Telephone Encounter (Signed)
Patient states he had virtual appt with Dr. Charmian Muff on 04-15-2021  Patient states Dr. Charmian Muff recommended guaiFENesin (MUCINEX) 600 MG 12 hr tablet  Patient states he has taken medication in the past and it did not work.  Patient will like a return call

## 2021-04-15 NOTE — Assessment & Plan Note (Signed)
Using liquid office without much relief.  We will step up dosing and send in the first scription of guaifenesin 600 twice daily per patient.  Also encouraged to drink plenty of fluid to help thin congestion of his chest.  Offered to send him in something for cough but patient states it was not that bothersome.  States that he gets this every year and generally takes antibiotic given length of symptoms 2 to 3 days we will not send an antibiotic yet but will circle back and check to see if he is having any improvement if not we will start him on antibiotic.

## 2021-04-15 NOTE — Progress Notes (Signed)
Patient ID: Noah Barnes, male    DOB: 1946-03-30, 75 y.o.   MRN: [id number]  Virtual visit completed through [employer], a video enabled telemedicine application. Due to national recommendations of social distancing due to COVID-19, a virtual visit is felt to be most appropriate for this patient at this time. Reviewed limitations, risks, security and privacy concerns of performing a virtual visit and the availability of in person appointments. I also reviewed that there may be a patient responsible charge related to this service. The patient agreed to proceed.   Patient location: home Provider location: [place] [facility], office Persons participating in this virtual visit: patient, provider   If any vitals were documented, they were collected by patient at home unless specified below.    There were no vitals taken for this visit.   CC: cough, chest congestion  Subjective:   HPI: Noah Barnes is a 75 y.o. male presenting on 04/15/2021 for Cough (Started around 10/11 or 04/13/21, chest tightness, dry cough, post nasal drip, some SOB with waking. Has hx of recurrent bronchitis.)  Symptoms started Tuesday or Wednesday Has not tested for [employer] x2 with one booster and flu shot Has been using mucinex liquid with little relief    Relevant past medical, surgical, family and social history reviewed and updated as indicated. Interim medical history since our last visit reviewed. Allergies and medications reviewed and updated. Outpatient Medications Prior to Visit  Medication Sig Dispense Refill   ACCU-CHEK GUIDE test strip TEST BLOOD SUGAR TWICE DAILY AS INSTRUCTED 100 strip 3   Accu-Chek Softclix Lancets lancets CHECK BLOOD SUGAR TWICE DAILY AS DIRECTED 100 each 2   allopurinol (ZYLOPRIM) 300 MG tablet Take 1 tablet by mouth once daily 90 tablet 1   atorvastatin (LIPITOR) 20 MG tablet Take 1 tablet by mouth once daily 90 tablet 0   Blood Glucose Monitoring Suppl (ACCU-CHEK  GUIDE ME) w/Device KIT 1 each by Does not apply route in the morning and at bedtime. Use Accu Chek Guide me device to check blood sugar twice daily. 1 kit 0   ELIQUIS 2.5 MG TABS tablet Take 1 tablet by mouth twice daily 60 tablet 0   FERREX 150 150 MG capsule Take 1 capsule by mouth once daily 90 capsule 0   Fluticasone-Salmeterol (ADVAIR) 100-50 MCG/DOSE AEPB Inhale 1 puff into the lungs 2 (two) times daily as needed (chest tightness). 1 each 3   furosemide (LASIX) 40 MG tablet Take 40 mg by mouth daily.     glimepiride (AMARYL) 2 MG tablet Take 1 tablet (2 mg total) by mouth daily before supper. 90 tablet 1   hydrALAZINE (APRESOLINE) 50 MG tablet Take 1 tablet by mouth twice daily 180 tablet 2   LINZESS 290 MCG CAPS capsule TAKE 1 CAPSULE BY MOUTH ONCE DAILY BEFORE BREAKFAST 30 capsule 2   metoprolol tartrate (LOPRESSOR) 100 MG tablet Take 1 tablet by mouth twice daily 180 tablet 0   Semaglutide (RYBELSUS) 14 MG TABS TAKE 1 TABLET BY MOUTH ONCE DAILY BEFORE BREAKFAST TAKE  30  MINUTES  BEFORE  BREAKFAST  WITH  A  SIP  OF  WATER 30 tablet 0   sildenafil (VIAGRA) 100 MG tablet TAKE 1 TABLET BY MOUTH EVERY DAY AS NEEDED FOR ERECTILE DYSFUNCTION 6 tablet 1   tamsulosin (FLOMAX) 0.4 MG CAPS capsule TAKE 1 CAPSULE BY MOUTH ONCE DAILY AFTER SUPPER 90 capsule 1   COVID-19 mRNA Vac-TriS, Pfizer, (PFIZER-BIONT COVID-19 VAC-TRIS) SUSP injection Inject  into the muscle. 0.3 mL 0   No facility-administered medications prior to visit.     Per HPI unless specifically indicated in ROS section below Review of Systems  Constitutional:  Positive for fatigue. Negative for chills and fever.  HENT:  Negative for congestion.   Respiratory:  Positive for cough (dry) and shortness of breath (doe).   Cardiovascular:  Negative for chest pain (sore feeling).  Gastrointestinal:  Negative for diarrhea, nausea and vomiting.  Musculoskeletal:  Negative for arthralgias and myalgias.  Neurological:  Positive for headaches.   Objective:  There were no vitals taken for this visit.  Wt Readings from Last 3 Encounters:  03/25/21 226 lb 6.4 oz (102.7 kg)  01/13/21 218 lb (98.9 kg)  12/31/20 218 lb (98.9 kg)       Physical exam: Gen: alert, NAD, not ill appearing Pulm: speaks in complete sentences without increased work of breathing Psych: normal mood, normal thought content      Results for orders placed or performed in visit on 03/25/21  CMP ([place] only)  Result Value Ref Range   Sodium 138 135 - 145 mmol/L   Potassium 4.4 3.5 - 5.1 mmol/L   Chloride 109 98 - 111 mmol/L   CO2 21 (L) 22 - 32 mmol/L   Glucose, Bld 100 (H) 70 - 99 mg/dL   BUN 28 (H) 8 - 23 mg/dL   Creatinine 1.47 (H) 0.61 - 1.24 mg/dL   Calcium 9.3 8.9 - 10.3 mg/dL   Total Protein 6.6 6.5 - 8.1 g/dL   Albumin 4.1 3.5 - 5.0 g/dL   AST 20 15 - 41 U/L   ALT 58 (H) 0 - 44 U/L   Alkaline Phosphatase 67 38 - 126 U/L   Total Bilirubin 0.7 0.3 - 1.2 mg/dL   GFR, Estimated 50 (L) >60 mL/min   Anion gap 8 5 - 15  CBC with Differential ([facility] Only)  Result Value Ref Range   WBC Count 4.1 4.0 - 10.5 K/uL   RBC 4.82 4.22 - 5.81 MIL/uL   Hemoglobin 13.2 13.0 - 17.0 g/dL   HCT 41.0 39.0 - 52.0 %   MCV 85.1 80.0 - 100.0 fL   MCH 27.4 26.0 - 34.0 pg   MCHC 32.2 30.0 - 36.0 g/dL   RDW 14.1 11.5 - 15.5 %   Platelet Count 206 150 - 400 K/uL   nRBC 0.0 0.0 - 0.2 %   Neutrophils Relative % 53 %   Neutro Abs 2.2 1.7 - 7.7 K/uL   Lymphocytes Relative 32 %   Lymphs Abs 1.3 0.7 - 4.0 K/uL   Monocytes Relative 11 %   Monocytes Absolute 0.4 0.1 - 1.0 K/uL   Eosinophils Relative 3 %   Eosinophils Absolute 0.1 0.0 - 0.5 K/uL   Basophils Relative 1 %   Basophils Absolute 0.0 0.0 - 0.1 K/uL   Immature Granulocytes 0 %   Abs Immature Granulocytes 0.01 0.00 - 0.07 K/uL   Assessment & Plan:   Problem List Items Addressed This Visit       Respiratory   Chest congestion    Using liquid office without much relief.  We will step up  dosing and send in the first scription of guaifenesin 600 twice daily per patient.  Also encouraged to drink plenty of fluid to help thin congestion of his chest.  Offered to send him in something for cough but patient states it was not that bothersome.  States that he gets  this every year and generally takes antibiotic given length of symptoms 2 to 3 days we will not send an antibiotic yet but will circle back and check to see if he is having any improvement if not we will start him on antibiotic.      Relevant Medications   guaiFENesin (MUCINEX) 600 MG 12 hr tablet     Other   Chest tightness - Primary    Describes some chest tightness.  Does have a prescription for Advair inhaler 2 times daily as needed for chest tightness.  Patient does not have an albuterol inhaler.  We will send an albuterol inhaler to see if this helps with some of the chest tightness we will continue to monitor and start antibiotics if no improvement by Monday.      Relevant Medications   albuterol (VENTOLIN HFA) 108 (90 Base) MCG/ACT inhaler     No orders of the defined types were placed in this encounter.  No orders of the defined types were placed in this encounter.   I discussed the assessment and treatment plan with the patient. The patient was provided an opportunity to ask questions and all were answered. The patient agreed with the plan and demonstrated an understanding of the instructions. The patient was advised to call back or seek an in-person evaluation if the symptoms worsen or if the condition fails to improve as anticipated.  Follow up plan: No follow-ups on file.  Romilda Garret, NP

## 2021-04-18 NOTE — Progress Notes (Signed)
Subjective:    Patient ID: Noah Barnes, male    DOB: 11-Dec-1945, 75 y.o.   MRN: [id number]  This visit occurred during the SARS-CoV-2 public health emergency.  Safety protocols were in place, including screening questions prior to the visit, additional usage of staff PPE, and extensive cleaning of exam room while observing appropriate contact time as indicated for disinfecting solutions.    HPI He is here for an acute visit for cold symptoms.  His symptoms started 1 week ago.   He is experiencing soreness/tightness in the right mid sternum area that hurts when he presses on it more when he takes a deep breath.  He has has phlegm in the chest.  He has had it in the past - it occurs when the weather changes.  He is not able to cough up the phlegm, but can feel it in his chest.  He states a little shortness of breath when walking.  States occasional lightheadedness.  He denies other symptoms.  He has tried taking inhaler, mucinex-they have not helped much.  His symptoms have stayed the same since his VV 4 days ago.       Medications and allergies reviewed with patient and updated if appropriate.  Patient Active Problem List   Diagnosis Date Noted   Chest tightness 04/15/2021   Chest congestion 04/15/2021   Acquired trigger finger of right little finger 12/30/2020   Bilateral primary osteoarthritis of knee 12/30/2020   Aortic atherosclerosis ([place]) 12/29/2020   Reactive airway disease 10/20/2020   Acute bronchitis 07/28/2020   BPH (benign prostatic hyperplasia) 07/05/2020   Left lower quadrant abdominal pain 11/28/2019   Lung nodule - stable, no f/u needed 04/04/2019   History of DVT of lower extremity, b/l 04/03/2019   History of pulmonary embolus (PE) 04/03/2019   Hypomagnesemia 04/03/2019   HCAP (healthcare-associated pneumonia) 03/24/2019   Nephrolithiasis 03/06/2019   Seasonal allergic rhinitis 05/22/2018   MR (mitral regurgitation) 12/20/2017   CKD (chronic kidney  disease) stage 3, GFR 30-59 ml/min ([place]) 08/14/2017   Abdominal hernia without obstruction and without gangrene 01/10/2016   Chronic diastolic heart failure ([place]) 02/25/2015   Right renal mass 02/25/2015   Other constipation 02/22/2015   Chronic venous insufficiency 01/18/2015   Superficial phlebitis 01/18/2015   IBS (irritable bowel syndrome) 11/12/2013   Family history of prostate cancer 12/26/2011   Diabetes mellitus with renal complications ([place]) 56/97/9480   Gout 12/31/2007   HYPERLIPIDEMIA 08/14/2007   ERECTILE DYSFUNCTION 05/09/2007   Essential hypertension 11/28/2006    Current Outpatient Medications on File Prior to Visit  Medication Sig Dispense Refill   ACCU-CHEK GUIDE test strip TEST BLOOD SUGAR TWICE DAILY AS INSTRUCTED 100 strip 3   Accu-Chek Softclix Lancets lancets CHECK BLOOD SUGAR TWICE DAILY AS DIRECTED 100 each 2   albuterol (VENTOLIN HFA) 108 (90 Base) MCG/ACT inhaler Inhale 2 puffs into the lungs every 6 (six) hours as needed for wheezing or shortness of breath. 8 g 0   allopurinol (ZYLOPRIM) 300 MG tablet Take 1 tablet by mouth once daily 90 tablet 1   atorvastatin (LIPITOR) 20 MG tablet Take 1 tablet by mouth once daily 90 tablet 0   Blood Glucose Monitoring Suppl (ACCU-CHEK GUIDE ME) w/Device KIT 1 each by Does not apply route in the morning and at bedtime. Use Accu Chek Guide me device to check blood sugar twice daily. 1 kit 0   ELIQUIS 2.5 MG TABS tablet Take 1 tablet by mouth twice daily 60  tablet 0   FERREX 150 150 MG capsule Take 1 capsule by mouth once daily 90 capsule 0   Fluticasone-Salmeterol (ADVAIR) 100-50 MCG/DOSE AEPB Inhale 1 puff into the lungs 2 (two) times daily as needed (chest tightness). 1 each 3   furosemide (LASIX) 40 MG tablet Take 40 mg by mouth daily.     glimepiride (AMARYL) 2 MG tablet Take 1 tablet (2 mg total) by mouth daily before supper. 90 tablet 1   hydrALAZINE (APRESOLINE) 50 MG tablet Take 1 tablet by mouth twice daily 180  tablet 2   LINZESS 290 MCG CAPS capsule TAKE 1 CAPSULE BY MOUTH ONCE DAILY BEFORE BREAKFAST 30 capsule 2   metoprolol tartrate (LOPRESSOR) 100 MG tablet Take 1 tablet by mouth twice daily 180 tablet 0   Semaglutide (RYBELSUS) 14 MG TABS TAKE 1 TABLET BY MOUTH ONCE DAILY BEFORE BREAKFAST TAKE  30  MINUTES  BEFORE  BREAKFAST  WITH  A  SIP  OF  WATER 30 tablet 0   sildenafil (VIAGRA) 100 MG tablet TAKE 1 TABLET BY MOUTH EVERY DAY AS NEEDED FOR ERECTILE DYSFUNCTION 6 tablet 1   tamsulosin (FLOMAX) 0.4 MG CAPS capsule TAKE 1 CAPSULE BY MOUTH ONCE DAILY AFTER SUPPER 90 capsule 1   No current facility-administered medications on file prior to visit.    Past Medical History:  Diagnosis Date   CHF (congestive heart failure) (HCC)    Chronic diastolic heart failure (HCC)    Constipation    Diabetes mellitus    Diverticulitis    Diverticulosis    ED (erectile dysfunction)    Gout    Heart murmur    Hyperlipidemia    Hypertension    IBS (irritable bowel syndrome)    Renal calculus    Renal insufficiency    Tubular adenoma of colon     Past Surgical History:  Procedure Laterality Date   COLONOSCOPY  1998   negative; no F/U   EXTRACORPOREAL SHOCK WAVE LITHOTRIPSY Left 03/20/2019   Procedure: EXTRACORPOREAL SHOCK WAVE LITHOTRIPSY (ESWL);  Surgeon: Cleon Gustin, MD;  Location: [facility];  Service: Urology;  Laterality: Left;   knee effusion tapped      Social History   Socioeconomic History   Marital status: Married    Spouse name: Not on file   Number of children: 1   Years of education: Not on file   Highest education level: Not on file  Occupational History   Occupation: Metallurgist: [employer]    Comment: [phone]  Tobacco Use   Smoking status: Former    Packs/day: 0.30    Types: Cigarettes    Quit date: 07/04/1991    Years since quitting: 29.8   Smokeless tobacco: Never   Tobacco comments:    smoked age [age], up to 1/3 ppd; may be  less  Vaping Use   Vaping Use: Never used  Substance and Sexual Activity   Alcohol use: Yes    Alcohol/week: 2.0 standard drinks    Types: 2 Glasses of wine per week    Comment: rare   Drug use: No   Sexual activity: Yes  Other Topics Concern   Not on file  Social History Narrative   Not on file   Social Determinants of Health   Financial Resource Strain: Low Risk    Difficulty of Paying Living Expenses: Not hard at all  Food Insecurity: No Food Insecurity   Worried About Running Out  of Food in the Last Year: Never true   [place] in the Last Year: Never true  Transportation Needs: No Transportation Needs   Lack of Transportation (Medical): No   Lack of Transportation (Non-Medical): No  Physical Activity: Inactive   Days of Exercise per Week: 0 days   Minutes of Exercise per Session: 0 min  Stress: No Stress Concern Present   Feeling of Stress : Not at all  Social Connections: Socially Integrated   Frequency of Communication with Friends and Family: More than three times a week   Frequency of Social Gatherings with Friends and Family: More than three times a week   Attends Religious Services: More than 4 times per year   Active Member of [employer] or Organizations: Yes   Attends Music therapist: More than 4 times per year   Marital Status: Married    Family History  Problem Relation Age of Onset   Heart attack Mother 76   Hypertension Mother    Prostate cancer Father 50   Diabetes Father    Irritable bowel syndrome Father    Colon cancer Brother        dx in his late 32's   Irritable bowel syndrome Sister        x 2   Irritable bowel syndrome Brother    Stroke Neg Hx    Esophageal cancer Neg Hx    Rectal cancer Neg Hx    Stomach cancer Neg Hx     Review of Systems  Constitutional:  Negative for chills and fever.  HENT:  Negative for congestion, ear pain, postnasal drip, sinus pressure, sinus pain and sore throat.   Respiratory:  Positive  for cough (mild) and shortness of breath (with walking). Negative for wheezing.   Cardiovascular:  Positive for chest pain (right mid sternum - pain to touch). Negative for palpitations.  Gastrointestinal:  Negative for diarrhea and nausea.  Musculoskeletal:  Negative for myalgias.  Neurological:  Positive for light-headedness (occ). Negative for dizziness and headaches.      Objective:   Vitals:   04/19/21 0853  BP: 114/72  Pulse: (!) 58  Temp: 98.7 F (37.1 C)  SpO2: 96%   BP Readings from Last 3 Encounters:  04/19/21 114/72  03/25/21 105/76  01/13/21 124/78   Wt Readings from Last 3 Encounters:  04/19/21 221 lb (100.2 kg)  03/25/21 226 lb 6.4 oz (102.7 kg)  01/13/21 218 lb (98.9 kg)   Body mass index is 33.6 kg/m.   Physical Exam    GENERAL APPEARANCE: Appears stated age, well appearing, NAD EYES: conjunctiva clear, no icterus HENT: bilateral tympanic membranes and ear canals normal, oropharynx with no erythema or exudates, trachea midline, no cervical or supraclavicular lymphadenopathy LUNGS: Unlabored breathing, good air entry bilaterally, clear to auscultation without wheeze or crackles CHEST:  tender to palpation right mid-lower sternum CARDIOVASCULAR: Normal S1,S2 , no edema SKIN: Warm, dry      Assessment & Plan:    See Problem List for Assessment and Plan of chronic medical problems.

## 2021-04-18 NOTE — Telephone Encounter (Signed)
Spoke with patient today. Offered Urgent Care but patient refused to go and said he feels like they are a waist of time and have been unsatisfactory.   He said he would go to the ED if symptoms got worse.

## 2021-04-18 NOTE — Telephone Encounter (Signed)
Patient calling to check status of previous call.  Please call patient

## 2021-04-18 NOTE — Telephone Encounter (Signed)
Noah Barnes is still experiencing tightness in chest, the OTC medicine suggested by Noah Barnes in his virtual visit on Friday is not helping  Patient would like an antibiotic called in today, has an appointment on 10.18.22 at 8:50 am, but states he can come at any time

## 2021-04-18 NOTE — Telephone Encounter (Signed)
[facility] FYI 04/17/2021  Pt has had bad cough onset Tues and he had a virtual visit and he recommended mucinex and albuterol. He has pain when he cough in the severe pain chest. Denies close contact with covid in the last 2 wks. No fever.  Advised to go ED now. Pt understood but undecided.

## 2021-04-19 ENCOUNTER — Other Ambulatory Visit: Payer: Self-pay

## 2021-04-19 ENCOUNTER — Encounter: Payer: Self-pay | Admitting: Internal Medicine

## 2021-04-19 ENCOUNTER — Ambulatory Visit (INDEPENDENT_AMBULATORY_CARE_PROVIDER_SITE_OTHER): Payer: Medicare PPO

## 2021-04-19 ENCOUNTER — Ambulatory Visit: Payer: Medicare PPO | Admitting: Internal Medicine

## 2021-04-19 VITALS — BP 114/72 | HR 58 | Temp 98.7°F | Ht 68.0 in | Wt 221.0 lb

## 2021-04-19 DIAGNOSIS — R079 Chest pain, unspecified: Secondary | ICD-10-CM | POA: Diagnosis not present

## 2021-04-19 DIAGNOSIS — R059 Cough, unspecified: Secondary | ICD-10-CM | POA: Diagnosis not present

## 2021-04-19 DIAGNOSIS — R051 Acute cough: Secondary | ICD-10-CM | POA: Diagnosis not present

## 2021-04-19 MED ORDER — DOXYCYCLINE HYCLATE 100 MG PO TABS: 100.0000 mg | ORAL_TABLET | Freq: Two times a day (BID) | ORAL | 0 refills | Status: DC

## 2021-04-19 NOTE — Assessment & Plan Note (Signed)
Acute Right mid-lower pain along the sternum with palpation and deep breaths Pain sounds musculoskeletal in nature Given his cough he is concerned about the possibility of pneumonia, which I do not think is the case, but he may have some bronchitis with a muscular chest pain Advised Tylenol as needed Will check chest x-ray

## 2021-04-19 NOTE — Assessment & Plan Note (Signed)
Acute Cough started about 1 week ago and he is occasionally bringing up phlegm from his chest-having difficulty getting the phlegm up Also having some chest pain, which sounds more musculoskeletal in nature History of bronchitis and pneumonia Will get a chest x-ray, but suspicion is low for pneumonia Will prescribe doxycycline 100 mg twice daily x10 days given history and lack of improvement in his symptoms

## 2021-04-19 NOTE — Patient Instructions (Addendum)
    Have a chest xray downstairs.      Medications changes include :  none

## 2021-04-25 ENCOUNTER — Other Ambulatory Visit: Payer: Self-pay | Admitting: Endocrinology

## 2021-05-02 ENCOUNTER — Other Ambulatory Visit: Payer: Medicare PPO

## 2021-05-03 ENCOUNTER — Other Ambulatory Visit: Payer: Self-pay

## 2021-05-03 ENCOUNTER — Other Ambulatory Visit (INDEPENDENT_AMBULATORY_CARE_PROVIDER_SITE_OTHER): Payer: Medicare PPO

## 2021-05-03 DIAGNOSIS — E669 Obesity, unspecified: Secondary | ICD-10-CM | POA: Diagnosis not present

## 2021-05-03 DIAGNOSIS — E1169 Type 2 diabetes mellitus with other specified complication: Secondary | ICD-10-CM | POA: Diagnosis not present

## 2021-05-03 LAB — BASIC METABOLIC PANEL
BUN: 23 mg/dL (ref 6–23)
CO2: 20 mEq/L (ref 19–32)
Calcium: 8.9 mg/dL (ref 8.4–10.5)
Chloride: 112 mEq/L (ref 96–112)
Creatinine, Ser: 1.45 mg/dL (ref 0.40–1.50)
GFR: 47.53 mL/min — ABNORMAL LOW (ref >60.00)
Glucose, Bld: 104 mg/dL — ABNORMAL HIGH (ref 70–99)
Potassium: 4 mEq/L (ref 3.5–5.1)
Sodium: 140 mEq/L (ref 135–145)

## 2021-05-03 LAB — HEMOGLOBIN A1C: Hgb A1c MFr Bld: 5.8 % (ref 4.6–6.5)

## 2021-05-05 ENCOUNTER — Ambulatory Visit: Payer: Medicare PPO | Admitting: Endocrinology

## 2021-05-05 ENCOUNTER — Other Ambulatory Visit: Payer: Self-pay

## 2021-05-05 ENCOUNTER — Encounter: Payer: Self-pay | Admitting: Endocrinology

## 2021-05-05 ENCOUNTER — Other Ambulatory Visit: Payer: Self-pay | Admitting: Internal Medicine

## 2021-05-05 VITALS — BP 130/80 | HR 66 | Ht 68.0 in | Wt 221.6 lb

## 2021-05-05 DIAGNOSIS — E1169 Type 2 diabetes mellitus with other specified complication: Secondary | ICD-10-CM | POA: Diagnosis not present

## 2021-05-05 DIAGNOSIS — E669 Obesity, unspecified: Secondary | ICD-10-CM

## 2021-05-05 NOTE — Patient Instructions (Signed)
Check blood sugars on waking up 3-4 days a week  Also check blood sugars about 2 hours after meals and do this after different meals by rotation  Recommended blood sugar levels on waking up are 90-130 and about 2 hours after meal is 130-180  Please bring your blood sugar monitor to each visit, thank you

## 2021-05-05 NOTE — Progress Notes (Signed)
Patient ID: Noah Barnes, male   DOB: [birth year], 75 y.o.   MRN: [id number]    Reason for Appointment : Follow-up visit  History of Present Illness          Diagnosis: Type 2 diabetes mellitus, date of diagnosis: 2008       Past history: His diabetes was borderline in the beginning and not clear what medications he was started on. Has been taking metformin for several years and this was later changed to [place]. Amaryl probably started in 5/14 when blood sugars were higher, initially was given 1 mg and now has been taking 2 mg. He had a relatively high A1c of 7.1, difficulty losing weight and a glucose of 202 on his initial consultation in 06/2013 He was then tried on Invokana in addition to his Janumet and Amaryl Invokana was reduced to 100 mg in early 2015 been creatinine had gone up to 1.6  Recent history:   Oral hypoglycemic drugs the patient is taking are: Amaryl 2  mg at supper, Rybelsus 14 mg daily   His A1c is relatively low at 5.8 compared to 5.9 His previous range 5.8-6.6 Fructosamine last 241  Current management, blood sugar patterns and problems identified:    His Amaryl was reduced to 2 mg from 4 mg on the last visit because of potential for low sugars and renal dysfunction  With this blood sugars are overall still about the same as before  No hypoglycemia  Although he is continuing Rybelsus 14 mg he has not lost any weight  Previously had done better with exercising at the gym He is still not exercising as he has difficulty with his knees and still has not gone back to the gym Lowest blood sugar 80 highest after supper 192 Postprandial readings are generally fairly good with only occasional upper normal readings based on his diet No nausea from Rybelsus  No nausea with Rybelsus  Side effects from medications have been:  renal dysfunction from Invokana  Glucose monitoring:  done <1 times a day         Glucometer:  Accu-Chek   Blood Glucose readings     PRE-MEAL Fasting Lunch Dinner Bedtime Overall  Glucose range:       Mean/median: 108    113   POST-MEAL PC Breakfast PC Lunch PC Dinner  Glucose range:   107-192  Mean/median:   119    Previously:  PRE-MEAL Fasting Lunch Dinner Bedtime Overall  Glucose range:     81-177  Mean/median: 97    111   POST-MEAL PC Breakfast PC Lunch PC Dinner  Glucose range:     Mean/median:   132    Glycemic control:   Lab Results  Component Value Date   HGBA1C 5.8 05/03/2021   HGBA1C 5.9 12/28/2020   HGBA1C 6.1 08/30/2020   Lab Results  Component Value Date   MICROALBUR 9.8 (H) 08/30/2020   LDLCALC 73 12/28/2020   CREATININE 1.45 05/03/2021    Self-care: Usually has low-fat diet, eggs in am with some carbohydrate, frequently no lunch     Dietician consultations: 08/2013              Weight history:   Wt Readings from Last 3 Encounters:  05/05/21 221 lb 9.6 oz (100.5 kg)  04/19/21 221 lb (100.2 kg)  03/25/21 226 lb 6.4 oz (102.7 kg)       Allergies as of 05/05/2021       Reactions  Ace Inhibitors Other (See Comments)   Severe AKI due to ARB + dehydration Aug 2016   Angiotensin Receptor Blockers Other (See Comments)   Severe AKI due to ARB + dehydration Aug 2016        Medication List        Accurate as of May 05, 2021  9:39 AM. If you have any questions, ask your nurse or doctor.          Accu-Chek Guide Me w/Device Kit 1 each by Does not apply route in the morning and at bedtime. Use Accu Chek Guide me device to check blood sugar twice daily.   Accu-Chek Guide test strip Generic drug: glucose blood TEST BLOOD SUGAR TWICE DAILY AS INSTRUCTED   Accu-Chek Softclix Lancets lancets CHECK BLOOD SUGAR TWICE DAILY AS DIRECTED   albuterol 108 (90 Base) MCG/ACT inhaler Commonly known as: VENTOLIN HFA Inhale 2 puffs into the lungs every 6 (six) hours as needed for wheezing or shortness of breath.   allopurinol 300 MG tablet Commonly known as:  ZYLOPRIM Take 1 tablet by mouth once daily   atorvastatin 20 MG tablet Commonly known as: LIPITOR Take 1 tablet by mouth once daily   doxycycline 100 MG tablet Commonly known as: VIBRA-TABS Take 1 tablet (100 mg total) by mouth 2 (two) times daily.   Eliquis 2.5 MG Tabs tablet Generic drug: apixaban Take 1 tablet by mouth twice daily   Ferrex 150 150 MG capsule Generic drug: iron polysaccharides Take 1 capsule by mouth once daily   Fluticasone-Salmeterol 100-50 MCG/DOSE Aepb Commonly known as: ADVAIR Inhale 1 puff into the lungs 2 (two) times daily as needed (chest tightness).   furosemide 40 MG tablet Commonly known as: LASIX Take 40 mg by mouth daily.   glimepiride 2 MG tablet Commonly known as: AMARYL Take 1 tablet (2 mg total) by mouth daily before supper.   hydrALAZINE 50 MG tablet Commonly known as: APRESOLINE Take 1 tablet by mouth twice daily   Linzess 290 MCG Caps capsule Generic drug: linaclotide TAKE 1 CAPSULE BY MOUTH ONCE DAILY BEFORE BREAKFAST   metoprolol tartrate 100 MG tablet Commonly known as: LOPRESSOR Take 1 tablet by mouth twice daily   Rybelsus 14 MG Tabs Generic drug: Semaglutide TAKE 1 TABLET BY MOUTH ONCE DAILY 30  MINUTES  BEFORE  BREAKFAST  WITH  A  SIP  OF  WATER   sildenafil 100 MG tablet Commonly known as: VIAGRA TAKE 1 TABLET BY MOUTH EVERY DAY AS NEEDED FOR ERECTILE DYSFUNCTION   tamsulosin 0.4 MG Caps capsule Commonly known as: FLOMAX TAKE 1 CAPSULE BY MOUTH ONCE DAILY AFTER SUPPER        Allergies:  Allergies  Allergen Reactions   Ace Inhibitors Other (See Comments)    Severe AKI due to ARB + dehydration Aug 2016   Angiotensin Receptor Blockers Other (See Comments)    Severe AKI due to ARB + dehydration Aug 2016    Past Medical History:  Diagnosis Date   CHF (congestive heart failure) (HCC)    Chronic diastolic heart failure (HCC)    Constipation    Diabetes mellitus    Diverticulitis    Diverticulosis    ED  (erectile dysfunction)    Gout    Heart murmur    Hyperlipidemia    Hypertension    IBS (irritable bowel syndrome)    Renal calculus    Renal insufficiency    Tubular adenoma of colon     Past Surgical History:  Procedure Laterality Date   COLONOSCOPY  1998   negative; no F/U   EXTRACORPOREAL SHOCK WAVE LITHOTRIPSY Left 03/20/2019   Procedure: EXTRACORPOREAL SHOCK WAVE LITHOTRIPSY (ESWL);  Surgeon: Cleon Gustin, MD;  Location: [facility];  Service: Urology;  Laterality: Left;   knee effusion tapped      Family History  Problem Relation Age of Onset   Heart attack Mother 43   Hypertension Mother    Prostate cancer Father 78   Diabetes Father    Irritable bowel syndrome Father    Colon cancer Brother        dx in his late 90's   Irritable bowel syndrome Sister        x 2   Irritable bowel syndrome Brother    Stroke Neg Hx    Esophageal cancer Neg Hx    Rectal cancer Neg Hx    Stomach cancer Neg Hx     Social History:  reports that he quit smoking about 29 years ago. His smoking use included cigarettes. He smoked an average of .3 packs per day. He has never used smokeless tobacco. He reports current alcohol use of about 2.0 standard drinks per week. He reports that he does not use drugs.    Review of Systems   RENAL dysfunction: Creatinine is as follows, followed by nephrologist  Lab Results  Component Value Date   CREATININE 1.45 05/03/2021   CREATININE 1.47 (H) 03/25/2021   CREATININE 1.75 (H) 12/28/2020   CREATININE 1.56 (H) 09/22/2020     Lab Results  Component Value Date   K 4.0 05/03/2021         Lipids: Has been on Lipitor 20 mg for hyperlipidemia, baseline LDL about 162 Followed by Dr. Quay Burow  Lab Results  Component Value Date   CHOL 132 12/28/2020   HDL 47.70 12/28/2020   LDLCALC 73 12/28/2020   LDLDIRECT 80.1 04/15/2014   TRIG 59.0 12/28/2020   CHOLHDL 3 12/28/2020       The blood pressure has been high for several years and usually well  controlled.  Currently taking, hydralazine, and metoprolol from his PCP  He does check his blood pressure at home periodically   BP Readings from Last 3 Encounters:  05/05/21 130/80  04/19/21 114/72  03/25/21 105/76      LABS:  Lab on 05/03/2021  Component Date Value Ref Range Status   Sodium 05/03/2021 140  135 - 145 mEq/L Final   Potassium 05/03/2021 4.0  3.5 - 5.1 mEq/L Final   Chloride 05/03/2021 112  96 - 112 mEq/L Final   CO2 05/03/2021 20  19 - 32 mEq/L Final   Glucose, Bld 05/03/2021 104 (A)  70 - 99 mg/dL Final   BUN 05/03/2021 23  6 - 23 mg/dL Final   Creatinine, Ser 05/03/2021 1.45  0.40 - 1.50 mg/dL Final   GFR 05/03/2021 47.53 (A)  >60.00 mL/min Final   Calculated using the CKD-EPI Creatinine Equation (2021)   Calcium 05/03/2021 8.9  8.4 - 10.5 mg/dL Final   Hgb A1c MFr Bld 05/03/2021 5.8  4.6 - 6.5 % Final   Glycemic Control Guidelines for People with Diabetes:Non Diabetic:  <6%Goal of Therapy: <7%Additional Action Suggested:  >8%     Physical Examination:  BP 130/80 (BP Location: Left Arm, Patient Position: Sitting, Cuff Size: Normal)   Pulse 66   Ht '5\' 8"'  (1.727 m)   Wt 221 lb 9.6 oz (100.5 kg)   SpO2 96%   BMI 33.69  kg/m      ASSESSMENT/PLAN:   Diabetes type 2, with obesity  See history of present illness for detailed discussion of current diabetes management, blood sugar patterns and problems identified  He is on Rybelsus 14 mg and Amaryl 2 mg  A1c is 5.8 and consistently improved  Blood sugars are excellent with his regimen of 14 mg of Rybelsus even with reducing Amaryl to 2 mg Although he has not lost weight he is having excellent control most of the time He can do better with exercise and may benefit from going back to the gym No change in medications required  RENAL dysfunction: His creatinine is fluctuating, to be followed by nephrologist   There are no Patient Instructions on file for this visit.    Elayne Snare 05/05/2021, 9:39  AM

## 2021-05-12 ENCOUNTER — Other Ambulatory Visit: Payer: Self-pay | Admitting: Hematology & Oncology

## 2021-05-12 ENCOUNTER — Telehealth: Payer: Self-pay

## 2021-05-12 NOTE — Telephone Encounter (Signed)
Patient requesting to renew handicap form. Please call patient for pickup at [phone]

## 2021-05-12 NOTE — Telephone Encounter (Signed)
Placed in Dr. Burns folder for signature. °

## 2021-05-16 NOTE — Telephone Encounter (Signed)
Spoke with patient today. Left up front for pick up.

## 2021-05-18 ENCOUNTER — Other Ambulatory Visit: Payer: Self-pay | Admitting: Internal Medicine

## 2021-05-23 ENCOUNTER — Other Ambulatory Visit: Payer: Self-pay | Admitting: Endocrinology

## 2021-05-23 ENCOUNTER — Telehealth: Payer: Self-pay | Admitting: Internal Medicine

## 2021-05-23 MED ORDER — METRONIDAZOLE 250 MG PO TABS: 250.0000 mg | ORAL_TABLET | Freq: Three times a day (TID) | ORAL | 0 refills | Status: DC

## 2021-05-23 MED ORDER — CIPROFLOXACIN HCL 500 MG PO TABS: 500.0000 mg | ORAL_TABLET | Freq: Two times a day (BID) | ORAL | 0 refills | Status: DC

## 2021-05-23 NOTE — Telephone Encounter (Signed)
Pt thinks he is having a diverticulitis flare. Reports he started having LLQ abd pain about 3-4 days ago. He is not running a fever. Pt is taking linzess for his constipation. Requesting something be called in for diverticulitis, there are not any available appts this week.  Please advise.

## 2021-05-23 NOTE — Telephone Encounter (Signed)
Spoke with pt and he is aware. Scripts sent to pharmacy. 

## 2021-05-23 NOTE — Telephone Encounter (Signed)
Cipro 500 mg bid, metronidazole 250 mg tid x 7 days Call if not getting better

## 2021-05-23 NOTE — Telephone Encounter (Signed)
Inbound call from patient. States he believes he is having a diverticulitis flare. He is experiencing LLQ pain and constipation. Is requesting medication that will help. Scheduled appt with APP 12/6

## 2021-05-30 DIAGNOSIS — N2581 Secondary hyperparathyroidism of renal origin: Secondary | ICD-10-CM | POA: Diagnosis not present

## 2021-05-30 DIAGNOSIS — D631 Anemia in chronic kidney disease: Secondary | ICD-10-CM | POA: Diagnosis not present

## 2021-05-30 DIAGNOSIS — I129 Hypertensive chronic kidney disease with stage 1 through stage 4 chronic kidney disease, or unspecified chronic kidney disease: Secondary | ICD-10-CM | POA: Diagnosis not present

## 2021-05-30 DIAGNOSIS — E1122 Type 2 diabetes mellitus with diabetic chronic kidney disease: Secondary | ICD-10-CM | POA: Diagnosis not present

## 2021-05-30 DIAGNOSIS — N1831 Chronic kidney disease, stage 3a: Secondary | ICD-10-CM | POA: Diagnosis not present

## 2021-06-07 ENCOUNTER — Ambulatory Visit: Payer: Medicare PPO | Admitting: Nurse Practitioner

## 2021-06-07 ENCOUNTER — Encounter: Payer: Self-pay | Admitting: Nurse Practitioner

## 2021-06-07 VITALS — BP 112/70 | HR 60 | Ht 67.5 in | Wt 219.4 lb

## 2021-06-07 DIAGNOSIS — Z8719 Personal history of other diseases of the digestive system: Secondary | ICD-10-CM

## 2021-06-07 DIAGNOSIS — K5909 Other constipation: Secondary | ICD-10-CM | POA: Diagnosis not present

## 2021-06-07 NOTE — Patient Instructions (Signed)
We have given you a sample of Plenvu to do a bowel purge. Today or tomorrow mix the large packet with water, mix well and drink. If this is not effective enough by the next day then go ahead and mix the other two packets with water and drink.  It was great seeing you today! Thank you for entrusting me with your care and choosing [facility].  [name], NP  The [facility] providers would like to encourage you to use [facility] to communicate with providers for non-urgent requests or questions.  Due to long hold times on the telephone, sending your provider a message by [facility] may be faster and more efficient way to get a response. Please allow 48 business hours for a response.  Please remember that this is for non-urgent requests/questions.  If you are age 89 or older, your body mass index should be between 23-30. Your Body mass index is 33.85 kg/m. If this is out of the aforementioned range listed, please consider follow up with your Primary Care Provider.  If you are age 59 or younger, your body mass index should be between 19-25. Your Body mass index is 33.85 kg/m. If this is out of the aformentioned range listed, please consider follow up with your Primary Care Provider.

## 2021-06-07 NOTE — Progress Notes (Signed)
ASSESSMENT AND PLAN    #75 year old male with recent episode of presumed diverticulitis. Resolution of   symptoms after course of Cipro and Flagyl. Documented proximal sigmoid diverticulitis on CT scan May 2021.    #Personal history of adenomatous colon polyps /family history of colon cancer in first-degree relative.  He is up-to-date on colonoscopy, last one done in August 2021.  No polyps or cancers found.   # Chronic constipation. Takes daily Linzess but requires periodic bowel purges. . Currently with significant constipation again and requesting bowel prep. Miralax prep doesn't work for him.  -- No available samples of movi-prep or Suprep .  Given sample of Plenvu.  He can take half a dose today followed by the remaining half tomorrow  HISTORY OF PRESENT ILLNESS    Chief Complaint : Follow-up on diverticulitis  Noah Barnes is a 75 y.o. male , known to Dr. Norman Herrlich  with a past medical history of adenomatous colon polyps, diverticulitis, chronic constipation and IBS, chronic diastolic heart failure, hyperlipidemia, diabetes, CKD, history of DVT, chronic anticoagulation.  Additional medical history as listed in [place] .    Noah Barnes is here for follow up on recent episode of diverticulitis.  Patient called the office 05/23/2021 with complaints of LLQ pain and constipation.  The pain was constant, unrelieved with bowel movements . Patient felt like he was having an episode of diverticulitis .  We prescribed Cipro and Flagyl . He completed all the antibiotics and feels much better. He takes Linzess for severe constipation but generally requires a bowel prep  about once every 6 months.  Patient having acute on chronic constipation now.  He is requesting a bowel prep . Miralax prep never works.    PREVIOUS ENDOSCOPIC EVALUATIONS / PERTINENT STUDIES:   August 2021 colonoscopy -polyp surveillance plus family history of colon cancer -Moderate diverticulosis in the sigmoid colon, in the  descending colon and in the ascending colon. - Internal hemorrhoids. - The examination was otherwise normal. - No specimens collected.   Current Medications, Allergies, Past Medical History, Past Surgical History, Family History and Social History were reviewed in [employer] record.     Current Outpatient Medications  Medication Sig Dispense Refill   ACCU-CHEK GUIDE test strip TEST BLOOD SUGAR TWICE DAILY AS INSTRUCTED 100 strip 3   Accu-Chek Softclix Lancets lancets CHECK BLOOD SUGAR TWICE DAILY AS DIRECTED 100 each 2   albuterol (VENTOLIN HFA) 108 (90 Base) MCG/ACT inhaler Inhale 2 puffs into the lungs every 6 (six) hours as needed for wheezing or shortness of breath. 8 g 0   allopurinol (ZYLOPRIM) 300 MG tablet Take 1 tablet by mouth once daily 90 tablet 1   atorvastatin (LIPITOR) 20 MG tablet Take 1 tablet by mouth once daily 90 tablet 0   Blood Glucose Monitoring Suppl (ACCU-CHEK GUIDE ME) w/Device KIT 1 each by Does not apply route in the morning and at bedtime. Use Accu Chek Guide me device to check blood sugar twice daily. 1 kit 0   ciprofloxacin (CIPRO) 500 MG tablet Take 1 tablet (500 mg total) by mouth 2 (two) times daily. 14 tablet 0   doxycycline (VIBRA-TABS) 100 MG tablet Take 1 tablet (100 mg total) by mouth 2 (two) times daily. 20 tablet 0   ELIQUIS 2.5 MG TABS tablet Take 1 tablet by mouth twice daily 60 tablet 0   FERREX 150 150 MG capsule Take 1 capsule by mouth once daily 90 capsule 0  Fluticasone-Salmeterol (ADVAIR) 100-50 MCG/DOSE AEPB Inhale 1 puff into the lungs 2 (two) times daily as needed (chest tightness). 1 each 3   furosemide (LASIX) 40 MG tablet Take 40 mg by mouth daily.     glimepiride (AMARYL) 2 MG tablet Take 1 tablet (2 mg total) by mouth daily before supper. 90 tablet 1   hydrALAZINE (APRESOLINE) 50 MG tablet Take 1 tablet by mouth twice daily 180 tablet 2   LINZESS 290 MCG CAPS capsule TAKE 1 CAPSULE BY MOUTH ONCE DAILY BEFORE  BREAKFAST 30 capsule 0   metoprolol tartrate (LOPRESSOR) 100 MG tablet Take 1 tablet by mouth twice daily 180 tablet 0   metroNIDAZOLE (FLAGYL) 250 MG tablet Take 1 tablet (250 mg total) by mouth 3 (three) times daily. 21 tablet 0   Semaglutide (RYBELSUS) 14 MG TABS TAKE 1 TABLET BY MOUTH IN THE MORNING 30 MINUTES BEFORE BREAKFAST WITH A SIP OF WATER 30 tablet 0   sildenafil (VIAGRA) 100 MG tablet TAKE 1 TABLET BY MOUTH EVERY DAY AS NEEDED FOR ERECTILE DYSFUNCTION 6 tablet 1   tamsulosin (FLOMAX) 0.4 MG CAPS capsule TAKE 1 CAPSULE BY MOUTH ONCE DAILY AFTER SUPPER 90 capsule 1   No current facility-administered medications for this visit.    Review of Systems: No chest pain. No shortness of breath. No urinary complaints. No fevers  PHYSICAL EXAM :    Wt Readings from Last 3 Encounters:  06/07/21 219 lb 6 oz (99.5 kg)  05/05/21 221 lb 9.6 oz (100.5 kg)  04/19/21 221 lb (100.2 kg)    BP 112/70   Pulse 60   Ht 5' 7.5" (1.715 m)   Wt 219 lb 6 oz (99.5 kg)   BMI 33.85 kg/m  Constitutional:  Generally well appearing male in no acute distress. Psychiatric: Pleasant. Normal mood and affect. Behavior is normal. EENT: Pupils normal.  Conjunctivae are normal. No scleral icterus. Neck supple.  Cardiovascular: Normal rate, regular rhythm. No edema Pulmonary/chest: Effort normal and breath sounds normal. No wheezing, rales or rhonchi. Abdominal: Soft, nondistended, nontender. Bowel sounds active throughout. There are no masses palpable. No hepatomegaly. Neurological: Alert and oriented to person place and time. Skin: Skin is warm and dry. No rashes noted.  [name], NP  06/07/2021, 11:05 AM

## 2021-06-08 ENCOUNTER — Ambulatory Visit: Payer: Medicare PPO | Attending: Internal Medicine

## 2021-06-08 ENCOUNTER — Other Ambulatory Visit (HOSPITAL_BASED_OUTPATIENT_CLINIC_OR_DEPARTMENT_OTHER): Payer: Self-pay

## 2021-06-08 DIAGNOSIS — Z23 Encounter for immunization: Secondary | ICD-10-CM

## 2021-06-08 MED ORDER — PFIZER COVID-19 VAC BIVALENT 30 MCG/0.3ML IM SUSP
INTRAMUSCULAR | 0 refills | Status: DC
  Filled 2021-06-08: qty 0.3, 1d supply, fill #0

## 2021-06-08 NOTE — Progress Notes (Signed)
   Covid-19 Vaccination Clinic  Name:  Noah Barnes    MRN: [id number] DOB: [birth year]  06/08/2021  [name] was observed post Covid-19 immunization for 15 minutes without incident. He was provided with Vaccine Information Sheet and instruction to access the V-Safe system.   [name] was instructed to call 911 with any severe reactions post vaccine: Difficulty breathing  Swelling of face and throat  A fast heartbeat  A bad rash all over body  Dizziness and weakness   Immunizations Administered     Name Date Dose VIS Date Route   Pfizer Covid-19 Vaccine Bivalent Booster 06/08/2021 11:27 AM 0.3 mL 03/02/2021 Intramuscular   Manufacturer: [place]   Lot: [id number]   [place]: [phone]

## 2021-06-13 NOTE — Progress Notes (Signed)
Addendum: Reviewed and agree with assessment and management plan. Demetre Monaco M, MD  

## 2021-06-14 ENCOUNTER — Other Ambulatory Visit: Payer: Self-pay | Admitting: Hematology & Oncology

## 2021-06-20 ENCOUNTER — Other Ambulatory Visit: Payer: Self-pay | Admitting: Internal Medicine

## 2021-06-23 ENCOUNTER — Other Ambulatory Visit: Payer: Self-pay | Admitting: Internal Medicine

## 2021-06-29 ENCOUNTER — Other Ambulatory Visit: Payer: Self-pay | Admitting: Endocrinology

## 2021-07-06 NOTE — Progress Notes (Signed)
Subjective:    Patient ID: Noah Barnes, male    DOB: [birth year], 76 y.o.   MRN: [id number]  This visit occurred during the SARS-CoV-2 public health emergency.  Safety protocols were in place, including screening questions prior to the visit, additional usage of staff PPE, and extensive cleaning of exam room while observing appropriate contact time as indicated for disinfecting solutions.     HPI The patient is here for follow up of their chronic medical problems, including DM, htn, hld, CKD, gout, h/o DVT/PE, knee OA, BPH  Knee pain - some days are worse than others.  Has not seen anyone for it   He is not exercising regularly.    Medications and allergies reviewed with patient and updated if appropriate.  Patient Active Problem List   Diagnosis Date Noted   Acute cough 04/19/2021   Right-sided chest pain 04/19/2021   Chest tightness 04/15/2021   Chest congestion 04/15/2021   Acquired trigger finger of right little finger 12/30/2020   Bilateral primary osteoarthritis of knee 12/30/2020   Aortic atherosclerosis ([place]) 12/29/2020   Reactive airway disease 10/20/2020   Acute bronchitis 07/28/2020   BPH (benign prostatic hyperplasia) 07/05/2020   Left lower quadrant abdominal pain 11/28/2019   Lung nodule - stable, no f/u needed 04/04/2019   History of DVT of lower extremity, b/l 04/03/2019   History of pulmonary embolus (PE) 04/03/2019   Hypomagnesemia 04/03/2019   HCAP (healthcare-associated pneumonia) 03/24/2019   Nephrolithiasis 03/06/2019   Seasonal allergic rhinitis 05/22/2018   MR (mitral regurgitation) 12/20/2017   CKD (chronic kidney disease) stage 3, GFR 30-59 ml/min ([place]) 08/14/2017   Abdominal hernia without obstruction and without gangrene 01/10/2016   Chronic diastolic heart failure ([place]) 02/25/2015   Right renal mass 02/25/2015   Other constipation 02/22/2015   Chronic venous insufficiency 01/18/2015   Superficial phlebitis 01/18/2015   IBS (irritable  bowel syndrome) 11/12/2013   Family history of prostate cancer 12/26/2011   Diabetes mellitus with renal complications ([place]) 99/35/7017   Gout 12/31/2007   HYPERLIPIDEMIA 08/14/2007   ERECTILE DYSFUNCTION 05/09/2007   Essential hypertension 11/28/2006    Current Outpatient Medications on File Prior to Visit  Medication Sig Dispense Refill   ACCU-CHEK GUIDE test strip TEST BLOOD SUGAR TWICE DAILY AS INSTRUCTED 100 strip 3   Accu-Chek Softclix Lancets lancets CHECK BLOOD SUGAR TWICE DAILY AS DIRECTED 100 each 2   albuterol (VENTOLIN HFA) 108 (90 Base) MCG/ACT inhaler Inhale 2 puffs into the lungs every 6 (six) hours as needed for wheezing or shortness of breath. 8 g 0   allopurinol (ZYLOPRIM) 300 MG tablet Take 1 tablet by mouth once daily 90 tablet 1   atorvastatin (LIPITOR) 20 MG tablet Take 1 tablet by mouth once daily 90 tablet 0   Blood Glucose Monitoring Suppl (ACCU-CHEK GUIDE ME) w/Device KIT 1 each by Does not apply route in the morning and at bedtime. Use Accu Chek Guide me device to check blood sugar twice daily. 1 kit 0   ELIQUIS 2.5 MG TABS tablet Take 1 tablet by mouth twice daily 60 tablet 2   FERREX 150 150 MG capsule Take 1 capsule by mouth once daily 90 capsule 0   Fluticasone-Salmeterol (ADVAIR) 100-50 MCG/DOSE AEPB Inhale 1 puff into the lungs 2 (two) times daily as needed (chest tightness). 1 each 3   furosemide (LASIX) 40 MG tablet Take 40 mg by mouth daily.     glimepiride (AMARYL) 2 MG tablet Take 1 tablet (  2 mg total) by mouth daily before supper. 90 tablet 1   hydrALAZINE (APRESOLINE) 50 MG tablet Take 1 tablet by mouth twice daily 180 tablet 2   LINZESS 290 MCG CAPS capsule TAKE 1 CAPSULE BY MOUTH ONCE DAILY BEFORE BREAKFAST 90 capsule 1   metoprolol tartrate (LOPRESSOR) 100 MG tablet Take 1 tablet by mouth twice daily 180 tablet 0   RYBELSUS 14 MG TABS TAKE 1 TABLET BY MOUTH IN THE MORNING 30 MINUTES BEFORE BREAKFAST WITH  A  SIP  OF  WATER 30 tablet 0   sildenafil  (VIAGRA) 100 MG tablet TAKE 1 TABLET BY MOUTH EVERY DAY AS NEEDED FOR ERECTILE DYSFUNCTION 6 tablet 1   tamsulosin (FLOMAX) 0.4 MG CAPS capsule TAKE 1 CAPSULE BY MOUTH ONCE DAILY AFTER SUPPER 90 capsule 1   No current facility-administered medications on file prior to visit.    Past Medical History:  Diagnosis Date   CHF (congestive heart failure) (HCC)    Chronic diastolic heart failure (HCC)    Constipation    Diabetes mellitus    Diverticulitis    Diverticulosis    ED (erectile dysfunction)    Gout    Heart murmur    Hyperlipidemia    Hypertension    IBS (irritable bowel syndrome)    Renal calculus    Renal insufficiency    Tubular adenoma of colon     Past Surgical History:  Procedure Laterality Date   COLONOSCOPY  1998   negative; no F/U   EXTRACORPOREAL SHOCK WAVE LITHOTRIPSY Left 03/20/2019   Procedure: EXTRACORPOREAL SHOCK WAVE LITHOTRIPSY (ESWL);  Surgeon: Cleon Gustin, MD;  Location: [facility];  Service: Urology;  Laterality: Left;   knee effusion tapped      Social History   Socioeconomic History   Marital status: Married    Spouse name: Not on file   Number of children: 1   Years of education: Not on file   Highest education level: Not on file  Occupational History   Occupation: Metallurgist: [employer]    Comment: [phone]  Tobacco Use   Smoking status: Former    Packs/day: 0.30    Types: Cigarettes    Quit date: 07/04/1991    Years since quitting: 30.0   Smokeless tobacco: Never   Tobacco comments:    smoked age [age], up to 1/3 ppd; may be less  Vaping Use   Vaping Use: Never used  Substance and Sexual Activity   Alcohol use: Yes    Alcohol/week: 2.0 standard drinks    Types: 2 Glasses of wine per week    Comment: rare   Drug use: No   Sexual activity: Yes  Other Topics Concern   Not on file  Social History Narrative   Not on file   Social Determinants of Health   Financial Resource Strain: Low  Risk    Difficulty of Paying Living Expenses: Not hard at all  Food Insecurity: No Food Insecurity   Worried About Charity fundraiser in the Last Year: Never true   [place] in the Last Year: Never true  Transportation Needs: No Transportation Needs   Lack of Transportation (Medical): No   Lack of Transportation (Non-Medical): No  Physical Activity: Inactive   Days of Exercise per Week: 0 days   Minutes of Exercise per Session: 0 min  Stress: No Stress Concern Present   Feeling of Stress : Not at  all  Social Connections: Engineer, building services of Communication with Friends and Family: More than three times a week   Frequency of Social Gatherings with Friends and Family: More than three times a week   Attends Religious Services: More than 4 times per year   Active Member of [employer] or Organizations: Yes   Attends Music therapist: More than 4 times per year   Marital Status: Married    Family History  Problem Relation Age of Onset   Heart attack Mother 68   Hypertension Mother    Prostate cancer Father 101   Diabetes Father    Irritable bowel syndrome Father    Colon cancer Brother        dx in his late 36's   Irritable bowel syndrome Sister        x 2   Irritable bowel syndrome Brother    Stroke Neg Hx    Esophageal cancer Neg Hx    Rectal cancer Neg Hx    Stomach cancer Neg Hx     Review of Systems  Constitutional:  Negative for chills and fever.  Respiratory:  Negative for cough, shortness of breath and wheezing.   Cardiovascular:  Positive for leg swelling (minimal). Negative for chest pain and palpitations.  Neurological:  Negative for dizziness, light-headedness and headaches.  Hematological:  Does not bruise/bleed easily.      Objective:   Vitals:   07/07/21 1016  BP: 108/60  Pulse: 60  Temp: 98.4 F (36.9 C)  SpO2: 95%   BP Readings from Last 3 Encounters:  07/07/21 108/60  06/07/21 112/70  05/05/21 130/80   Wt Readings  from Last 3 Encounters:  07/07/21 216 lb 6.4 oz (98.2 kg)  06/07/21 219 lb 6 oz (99.5 kg)  05/05/21 221 lb 9.6 oz (100.5 kg)   Body mass index is 33.39 kg/m.   Physical Exam    Constitutional: Appears well-developed and well-nourished. No distress.  HENT:  Head: Normocephalic and atraumatic.  Neck: Neck supple. No tracheal deviation present. No thyromegaly present.  No cervical lymphadenopathy Cardiovascular: Normal rate, regular rhythm and normal heart sounds.   No murmur heard. No carotid bruit .  Mild b/l LE edema Pulmonary/Chest: Effort normal and breath sounds normal. No respiratory distress. No has no wheezes. No rales.  Skin: Skin is warm and dry. Not diaphoretic.  Psychiatric: Normal mood and affect. Behavior is normal.      Assessment & Plan:    See Problem List for Assessment and Plan of chronic medical problems.

## 2021-07-06 NOTE — Patient Instructions (Addendum)
   Medications changes include :   none    Please followup in 6 months   

## 2021-07-07 ENCOUNTER — Encounter: Payer: Self-pay | Admitting: Internal Medicine

## 2021-07-07 ENCOUNTER — Other Ambulatory Visit: Payer: Self-pay

## 2021-07-07 ENCOUNTER — Ambulatory Visit: Payer: Medicare PPO | Admitting: Internal Medicine

## 2021-07-07 VITALS — BP 108/60 | HR 60 | Temp 98.4°F | Ht 67.5 in | Wt 216.4 lb

## 2021-07-07 DIAGNOSIS — M109 Gout, unspecified: Secondary | ICD-10-CM | POA: Diagnosis not present

## 2021-07-07 DIAGNOSIS — M170 Bilateral primary osteoarthritis of knee: Secondary | ICD-10-CM

## 2021-07-07 DIAGNOSIS — Z86718 Personal history of other venous thrombosis and embolism: Secondary | ICD-10-CM | POA: Diagnosis not present

## 2021-07-07 DIAGNOSIS — I10 Essential (primary) hypertension: Secondary | ICD-10-CM

## 2021-07-07 DIAGNOSIS — E782 Mixed hyperlipidemia: Secondary | ICD-10-CM | POA: Diagnosis not present

## 2021-07-07 DIAGNOSIS — E1129 Type 2 diabetes mellitus with other diabetic kidney complication: Secondary | ICD-10-CM | POA: Diagnosis not present

## 2021-07-07 DIAGNOSIS — N1831 Chronic kidney disease, stage 3a: Secondary | ICD-10-CM

## 2021-07-07 DIAGNOSIS — N401 Enlarged prostate with lower urinary tract symptoms: Secondary | ICD-10-CM

## 2021-07-07 NOTE — Assessment & Plan Note (Signed)
Chronic Controlled, stable Continue flomax 0.4 mg daily  

## 2021-07-07 NOTE — Assessment & Plan Note (Signed)
Chronic Following w/ nephrology stable

## 2021-07-07 NOTE — Assessment & Plan Note (Signed)
Chronic Mild Does not want to see anyone at this time Encouraged regular exercise

## 2021-07-07 NOTE — Assessment & Plan Note (Signed)
Chronic Management per Dr Dwyane Dee Lab Results  Component Value Date   HGBA1C 5.8 05/03/2021

## 2021-07-07 NOTE — Assessment & Plan Note (Signed)
Chronic BP well controlled Continue hydralazine 50 mg bid, metoprolol 100 mg bid

## 2021-07-07 NOTE — Assessment & Plan Note (Signed)
Chronic °On lifelong a/c with eliquis 2.5 mg bid °

## 2021-07-07 NOTE — Assessment & Plan Note (Signed)
Chronic Check lipid panel  Continue atorvastatin 20 mg daily Regular exercise and healthy diet encouraged  

## 2021-07-07 NOTE — Assessment & Plan Note (Signed)
Chronic Controlled, stable Continue allopurinol 300 mg daily

## 2021-07-11 ENCOUNTER — Other Ambulatory Visit: Payer: Self-pay | Admitting: Endocrinology

## 2021-07-18 ENCOUNTER — Other Ambulatory Visit: Payer: Self-pay | Admitting: Endocrinology

## 2021-07-18 ENCOUNTER — Other Ambulatory Visit: Payer: Self-pay | Admitting: Internal Medicine

## 2021-07-25 ENCOUNTER — Encounter: Payer: Self-pay | Admitting: Internal Medicine

## 2021-07-25 ENCOUNTER — Telehealth: Payer: Self-pay

## 2021-07-25 NOTE — Telephone Encounter (Signed)
Pt calling in stating that he is having another flare up for bronchitis and would like to get a Rx if possible.   I scheduled pt for appt if needed to be seen to address this issue for 07/26/21  Pt CB [phone]

## 2021-07-25 NOTE — Progress Notes (Signed)
Subjective:    Patient ID: Noah Barnes, male    DOB: Sep 07, 1945, 76 y.o.   MRN: [id number]  This visit occurred during the SARS-CoV-2 public health emergency.  Safety protocols were in place, including screening questions prior to the visit, additional usage of staff PPE, and extensive cleaning of exam room while observing appropriate contact time as indicated for disinfecting solutions.    HPI The patient is here for an acute visit.   Bronchitis:  his symptoms started Saturday.  He has tightness in his chest and soreness in the right chest.  He has an occasional dry cough.  He has a runny nose.  He has a history of pneumonia and is concerned this is the beginning of it.  He has increased pain with deep breaths and movement.  He denies any injuries or new activities that may have caused muscular pain.   He took mucinex.     Medications and allergies reviewed with patient and updated if appropriate.  Patient Active Problem List   Diagnosis Date Noted   Acute cough 04/19/2021   Right-sided chest pain 04/19/2021   Chest tightness 04/15/2021   Chest congestion 04/15/2021   Acquired trigger finger of right little finger 12/30/2020   Bilateral primary osteoarthritis of knee 12/30/2020   Aortic atherosclerosis ([place]) 12/29/2020   Reactive airway disease 10/20/2020   Acute bronchitis 07/28/2020   BPH (benign prostatic hyperplasia) 07/05/2020   Left lower quadrant abdominal pain 11/28/2019   Lung nodule - stable, no f/u needed 04/04/2019   History of DVT of lower extremity, b/l 04/03/2019   History of pulmonary embolus (PE) 04/03/2019   Hypomagnesemia 04/03/2019   HCAP (healthcare-associated pneumonia) 03/24/2019   Nephrolithiasis 03/06/2019   Seasonal allergic rhinitis 05/22/2018   MR (mitral regurgitation) 12/20/2017   CKD (chronic kidney disease) stage 3, GFR 30-59 ml/min ([place]) 08/14/2017   Abdominal hernia without obstruction and without gangrene 01/10/2016   Chronic  diastolic heart failure ([place]) 02/25/2015   Right renal mass 02/25/2015   Other constipation 02/22/2015   Chronic venous insufficiency 01/18/2015   Superficial phlebitis 01/18/2015   IBS (irritable bowel syndrome) 11/12/2013   Family history of prostate cancer 12/26/2011   Diabetes mellitus with renal complications ([place]) 97/67/3419   Gout 12/31/2007   HYPERLIPIDEMIA 08/14/2007   ERECTILE DYSFUNCTION 05/09/2007   Essential hypertension 11/28/2006    Current Outpatient Medications on File Prior to Visit  Medication Sig Dispense Refill   ACCU-CHEK GUIDE test strip TEST BLOOD SUGAR TWICE DAILY AS INSTRUCTED 100 strip 3   Accu-Chek Softclix Lancets lancets CHECK BLOOD SUGAR TWICE DAILT AS DIRECTED 100 each 2   albuterol (VENTOLIN HFA) 108 (90 Base) MCG/ACT inhaler Inhale 2 puffs into the lungs every 6 (six) hours as needed for wheezing or shortness of breath. 8 g 0   allopurinol (ZYLOPRIM) 300 MG tablet Take 1 tablet by mouth once daily 90 tablet 1   atorvastatin (LIPITOR) 20 MG tablet Take 1 tablet by mouth once daily 90 tablet 0   Blood Glucose Monitoring Suppl (ACCU-CHEK GUIDE ME) w/Device KIT 1 each by Does not apply route in the morning and at bedtime. Use Accu Chek Guide me device to check blood sugar twice daily. 1 kit 0   ELIQUIS 2.5 MG TABS tablet Take 1 tablet by mouth twice daily 60 tablet 2   FERREX 150 150 MG capsule Take 1 capsule by mouth once daily 90 capsule 0   Fluticasone-Salmeterol (ADVAIR) 100-50 MCG/DOSE AEPB Inhale 1 puff  into the lungs 2 (two) times daily as needed (chest tightness). 1 each 3   furosemide (LASIX) 40 MG tablet Take 40 mg by mouth daily.     glimepiride (AMARYL) 2 MG tablet TAKE 1 TABLET BY MOUTH ONCE DAILY BEFORE SUPPER 90 tablet 0   hydrALAZINE (APRESOLINE) 50 MG tablet Take 1 tablet by mouth twice daily 180 tablet 2   LINZESS 290 MCG CAPS capsule TAKE 1 CAPSULE BY MOUTH ONCE DAILY BEFORE BREAKFAST 90 capsule 1   metoprolol tartrate (LOPRESSOR) 100 MG  tablet Take 1 tablet by mouth twice daily 180 tablet 0   RYBELSUS 14 MG TABS TAKE 1 TABLET BY MOUTH IN THE MORNING 30 MINUTES BEFORE BREAKFAST WITH  A  SIP  OF  WATER 30 tablet 0   sildenafil (VIAGRA) 100 MG tablet TAKE 1 TABLET BY MOUTH EVERY DAY AS NEEDED FOR ERECTILE DYSFUNCTION 6 tablet 1   tamsulosin (FLOMAX) 0.4 MG CAPS capsule TAKE 1 CAPSULE BY MOUTH ONCE DAILY AFTER SUPPER 90 capsule 1   No current facility-administered medications on file prior to visit.    Past Medical History:  Diagnosis Date   CHF (congestive heart failure) (HCC)    Chronic diastolic heart failure (HCC)    Constipation    Diabetes mellitus    Diverticulitis    Diverticulosis    ED (erectile dysfunction)    Gout    Heart murmur    Hyperlipidemia    Hypertension    IBS (irritable bowel syndrome)    Renal calculus    Renal insufficiency    Tubular adenoma of colon     Past Surgical History:  Procedure Laterality Date   COLONOSCOPY  1998   negative; no F/U   EXTRACORPOREAL SHOCK WAVE LITHOTRIPSY Left 03/20/2019   Procedure: EXTRACORPOREAL SHOCK WAVE LITHOTRIPSY (ESWL);  Surgeon: Cleon Gustin, MD;  Location: [facility];  Service: Urology;  Laterality: Left;   knee effusion tapped      Social History   Socioeconomic History   Marital status: Married    Spouse name: Not on file   Number of children: 1   Years of education: Not on file   Highest education level: Not on file  Occupational History   Occupation: Metallurgist: [employer]    Comment: [phone]  Tobacco Use   Smoking status: Former    Packs/day: 0.30    Types: Cigarettes    Quit date: 07/04/1991    Years since quitting: 30.0   Smokeless tobacco: Never   Tobacco comments:    smoked age [age], up to 1/3 ppd; may be less  Vaping Use   Vaping Use: Never used  Substance and Sexual Activity   Alcohol use: Yes    Alcohol/week: 2.0 standard drinks    Types: 2 Glasses of wine per week    Comment:  rare   Drug use: No   Sexual activity: Yes  Other Topics Concern   Not on file  Social History Narrative   Not on file   Social Determinants of Health   Financial Resource Strain: Low Risk    Difficulty of Paying Living Expenses: Not hard at all  Food Insecurity: No Food Insecurity   Worried About Charity fundraiser in the Last Year: Never true   [place] in the Last Year: Never true  Transportation Needs: No Transportation Needs   Lack of Transportation (Medical): No   Lack of Transportation (Non-Medical):  No  Physical Activity: Inactive   Days of Exercise per Week: 0 days   Minutes of Exercise per Session: 0 min  Stress: No Stress Concern Present   Feeling of Stress : Not at all  Social Connections: Socially Integrated   Frequency of Communication with Friends and Family: More than three times a week   Frequency of Social Gatherings with Friends and Family: More than three times a week   Attends Religious Services: More than 4 times per year   Active Member of [employer] or Organizations: Yes   Attends Music therapist: More than 4 times per year   Marital Status: Married    Family History  Problem Relation Age of Onset   Heart attack Mother 71   Hypertension Mother    Prostate cancer Father 32   Diabetes Father    Irritable bowel syndrome Father    Colon cancer Brother        dx in his late 35's   Irritable bowel syndrome Sister        x 2   Irritable bowel syndrome Brother    Stroke Neg Hx    Esophageal cancer Neg Hx    Rectal cancer Neg Hx    Stomach cancer Neg Hx     Review of Systems  Constitutional:  Negative for chills and fever.  HENT:  Positive for rhinorrhea. Negative for congestion, ear pain, postnasal drip, sinus pressure, sinus pain and sore throat (dry).   Respiratory:  Positive for cough (occ, dry), chest tightness (right side) and shortness of breath (a little yesterday). Negative for wheezing.   Cardiovascular:  Negative for  chest pain and palpitations.  Gastrointestinal:  Negative for abdominal pain, diarrhea and nausea.  Musculoskeletal:  Negative for myalgias.  Neurological:  Negative for dizziness, light-headedness and headaches.      Objective:   Vitals:   07/26/21 0823  BP: 110/70  Pulse: 68  Temp: 98.3 F (36.8 C)  SpO2: 94%   BP Readings from Last 3 Encounters:  07/26/21 110/70  07/07/21 108/60  06/07/21 112/70   Wt Readings from Last 3 Encounters:  07/26/21 218 lb 3.2 oz (99 kg)  07/07/21 216 lb 6.4 oz (98.2 kg)  06/07/21 219 lb 6 oz (99.5 kg)   Body mass index is 33.67 kg/m.   Physical Exam    GENERAL APPEARANCE: Appears stated age, well appearing, NAD EYES: conjunctiva clear, no icterus HENT: bilateral tympanic membranes and ear canals normal, oropharynx with no erythema or exudates, trachea midline, no cervical or supraclavicular lymphadenopathy LUNGS: Unlabored breathing, good air entry bilaterally, clear to auscultation without wheeze or crackles CHEST:  increased pain on right side of chest with deep breaths and movement CARDIOVASCULAR: Normal S1,S2 , no edema SKIN: Warm, dry      Assessment & Plan:    See Problem List for Assessment and Plan of chronic medical problems.

## 2021-07-26 ENCOUNTER — Other Ambulatory Visit: Payer: Self-pay

## 2021-07-26 ENCOUNTER — Ambulatory Visit: Payer: Medicare PPO | Admitting: Internal Medicine

## 2021-07-26 ENCOUNTER — Ambulatory Visit (INDEPENDENT_AMBULATORY_CARE_PROVIDER_SITE_OTHER): Payer: Medicare PPO

## 2021-07-26 VITALS — BP 110/70 | HR 68 | Temp 98.3°F | Ht 67.5 in | Wt 218.2 lb

## 2021-07-26 DIAGNOSIS — R051 Acute cough: Secondary | ICD-10-CM

## 2021-07-26 DIAGNOSIS — R079 Chest pain, unspecified: Secondary | ICD-10-CM

## 2021-07-26 DIAGNOSIS — R0789 Other chest pain: Secondary | ICD-10-CM | POA: Diagnosis not present

## 2021-07-26 DIAGNOSIS — J209 Acute bronchitis, unspecified: Secondary | ICD-10-CM

## 2021-07-26 MED ORDER — AMOXICILLIN-POT CLAVULANATE 875-125 MG PO TABS: 1.0000 | ORAL_TABLET | Freq: Two times a day (BID) | ORAL | 0 refills | Status: DC

## 2021-07-26 NOTE — Assessment & Plan Note (Signed)
Acute Started 3 days ago Pleuritic in nature, associated with dry cough concern for early PNA, which he has had in the past CXR today augmentin bid x 10 days Symptomatic treatment discussed

## 2021-07-26 NOTE — Assessment & Plan Note (Addendum)
Acute Started 3 days ago Dry in nature, associated with right sided pleuritic chest pain - concern for early PNA, which he has had in the past CXR today augmentin bid x 10 days Symptomatic treatment discussed

## 2021-07-26 NOTE — Patient Instructions (Addendum)
°  Have a chest xray down stairs.    Medications changes include :   Augmentin twice daily for 10 days.   Your prescription(s) have been submitted to your pharmacy. Please take as directed and contact our office if you believe you are having problem(s) with the medication(s).

## 2021-07-26 NOTE — Telephone Encounter (Signed)
Patient seen in office today. 

## 2021-07-28 ENCOUNTER — Other Ambulatory Visit: Payer: Self-pay | Admitting: Endocrinology

## 2021-08-17 ENCOUNTER — Other Ambulatory Visit: Payer: Self-pay | Admitting: Internal Medicine

## 2021-08-24 ENCOUNTER — Other Ambulatory Visit: Payer: Self-pay | Admitting: Internal Medicine

## 2021-08-27 ENCOUNTER — Other Ambulatory Visit: Payer: Self-pay | Admitting: Endocrinology

## 2021-09-05 NOTE — Progress Notes (Signed)
? ? ?Subjective:  ? ? Patient ID: Noah Barnes, male    DOB: 07-Jan-1946, 76 y.o.   MRN: [id number] ? ?This visit occurred during the SARS-CoV-2 public health emergency.  Safety protocols were in place, including screening questions prior to the visit, additional usage of staff PPE, and extensive cleaning of exam room while observing appropriate contact time as indicated for disinfecting solutions. ? ? ? ?HPI ?Noah Barnes is here for  ?Chief Complaint  ?Patient presents with  ? Cough  ?  Cough and congestion, runny nose. Still feels like right lung is infected  ? ? ? ?Symptoms resolved after last illness.  The symptoms seem similar.  The Augmentin did help. ? ?He is having headaches, congestion in the chest and right lung feels funny-he feels congested, and he has nasal congestion.  His symptoms started 4-5 days ago and are progressing like when he had pneumonia.  ? ?Breathing in cold air tightens his chest.  Has used advair - it may have helped a little.  Using it 2/day.  Has not used the albuterol.  He is taking liquid Mucinex. ? ?Medications and allergies reviewed with patient and updated if appropriate. ? ?Current Outpatient Medications on File Prior to Visit  ?Medication Sig Dispense Refill  ? ACCU-CHEK GUIDE test strip TEST BLOOD SUGAR TWICE DAILY AS INSTRUCTED 100 strip 3  ? Accu-Chek Softclix Lancets lancets CHECK BLOOD SUGAR TWICE DAILT AS DIRECTED 100 each 2  ? albuterol (VENTOLIN HFA) 108 (90 Base) MCG/ACT inhaler Inhale 2 puffs into the lungs every 6 (six) hours as needed for wheezing or shortness of breath. 8 g 0  ? allopurinol (ZYLOPRIM) 300 MG tablet Take 1 tablet by mouth once daily 90 tablet 0  ? amoxicillin-clavulanate (AUGMENTIN) 875-125 MG tablet Take 1 tablet by mouth 2 (two) times daily. 20 tablet 0  ? atorvastatin (LIPITOR) 20 MG tablet Take 1 tablet by mouth once daily 90 tablet 0  ? Blood Glucose Monitoring Suppl (ACCU-CHEK GUIDE ME) w/Device KIT 1 each by Does not apply route in the morning  and at bedtime. Use Accu Chek Guide me device to check blood sugar twice daily. 1 kit 0  ? ELIQUIS 2.5 MG TABS tablet Take 1 tablet by mouth twice daily 60 tablet 2  ? FERREX 150 150 MG capsule Take 1 capsule by mouth once daily 90 capsule 0  ? Fluticasone-Salmeterol (ADVAIR) 100-50 MCG/DOSE AEPB Inhale 1 puff into the lungs 2 (two) times daily as needed (chest tightness). 1 each 3  ? furosemide (LASIX) 40 MG tablet Take 40 mg by mouth daily.    ? glimepiride (AMARYL) 2 MG tablet TAKE 1 TABLET BY MOUTH ONCE DAILY BEFORE SUPPER 90 tablet 0  ? hydrALAZINE (APRESOLINE) 50 MG tablet Take 1 tablet by mouth twice daily 180 tablet 2  ? LINZESS 290 MCG CAPS capsule TAKE 1 CAPSULE BY MOUTH ONCE DAILY BEFORE BREAKFAST 90 capsule 1  ? metoprolol tartrate (LOPRESSOR) 100 MG tablet Take 1 tablet by mouth twice daily 180 tablet 0  ? RYBELSUS 14 MG TABS TAKE 1 TABLET BY MOUTH IN THE MORNING 30 MINUTES BEFORE BREAKFAST WITH A SIP OF WATER 30 tablet 3  ? sildenafil (VIAGRA) 100 MG tablet TAKE 1 TABLET BY MOUTH EVERY DAY AS NEEDED FOR ERECTILE DYSFUNCTION 6 tablet 1  ? tamsulosin (FLOMAX) 0.4 MG CAPS capsule TAKE 1 CAPSULE BY MOUTH ONCE DAILY AFTER SUPPER 90 capsule 0  ? ?No current facility-administered medications on file prior to visit.  ? ? ?  Review of Systems  ?Constitutional:  Positive for appetite change (slightly dec) and fatigue. Negative for fever.  ?HENT:  Positive for congestion, rhinorrhea and sneezing. Negative for ear pain, sinus pain and sore throat.   ?Respiratory:  Positive for cough (mild, minimal sputum) and chest tightness (central and right side of chest). Negative for shortness of breath and wheezing.   ?Musculoskeletal:  Negative for myalgias.  ?Neurological:  Positive for headaches.  ? ?   ?Objective:  ? ?Vitals:  ? 09/06/21 1544  ?BP: 126/80  ?Pulse: 74  ?Temp: 98.6 ?F (37 ?C)  ?SpO2: 95%  ? ?BP Readings from Last 3 Encounters:  ?09/06/21 126/80  ?07/26/21 110/70  ?07/07/21 108/60  ? ?Wt Readings from Last 3  Encounters:  ?09/06/21 218 lb (98.9 kg)  ?07/26/21 218 lb 3.2 oz (99 kg)  ?07/07/21 216 lb 6.4 oz (98.2 kg)  ? ?Body mass index is 33.64 kg/m?. ? ?  ?Physical Exam ?Constitutional:   ?   General: He is not in acute distress. ?   Appearance: Normal appearance. He is not ill-appearing.  ?HENT:  ?   Head: Normocephalic.  ?   Right Ear: Tympanic membrane, ear canal and external ear normal. There is no impacted cerumen.  ?   Left Ear: Tympanic membrane, ear canal and external ear normal. There is no impacted cerumen.  ?   Mouth/Throat:  ?   Mouth: Mucous membranes are moist.  ?   Pharynx: No oropharyngeal exudate or posterior oropharyngeal erythema.  ?Eyes:  ?   Conjunctiva/sclera: Conjunctivae normal.  ?Cardiovascular:  ?   Rate and Rhythm: Normal rate and regular rhythm.  ?Pulmonary:  ?   Effort: Pulmonary effort is normal. No respiratory distress.  ?   Breath sounds: Normal breath sounds. No wheezing or rales.  ?Musculoskeletal:  ?   Cervical back: Neck supple. No tenderness.  ?Lymphadenopathy:  ?   Cervical: No cervical adenopathy.  ?Skin: ?   General: Skin is warm and dry.  ?   Findings: No rash.  ?Neurological:  ?   Mental Status: He is alert.  ? ?   ? ? ? ? ? ?Assessment & Plan:  ? ? ?See Problem List for Assessment and Plan of chronic medical problems.  ? ? ? ? ?

## 2021-09-06 ENCOUNTER — Other Ambulatory Visit: Payer: Self-pay

## 2021-09-06 ENCOUNTER — Ambulatory Visit: Payer: Medicare PPO | Admitting: Internal Medicine

## 2021-09-06 ENCOUNTER — Encounter: Payer: Self-pay | Admitting: Internal Medicine

## 2021-09-06 VITALS — BP 126/80 | HR 74 | Temp 98.6°F | Ht 67.5 in | Wt 218.0 lb

## 2021-09-06 DIAGNOSIS — J40 Bronchitis, not specified as acute or chronic: Secondary | ICD-10-CM | POA: Diagnosis not present

## 2021-09-06 DIAGNOSIS — R911 Solitary pulmonary nodule: Secondary | ICD-10-CM

## 2021-09-06 DIAGNOSIS — I10 Essential (primary) hypertension: Secondary | ICD-10-CM

## 2021-09-06 MED ORDER — AMOXICILLIN-POT CLAVULANATE 875-125 MG PO TABS: 1.0000 | ORAL_TABLET | Freq: Two times a day (BID) | ORAL | 0 refills | Status: DC

## 2021-09-06 NOTE — Patient Instructions (Addendum)
? ? ? ? ?  Medications changes include :   Augmentin ? ? ?Your prescription(s) have been sent to your pharmacy.  ? ? ?A referral was ordered for pulmonary.     Someone from that office will call you to schedule an appointment.  ? ? ?Return if symptoms worsen or fail to improve. ? ?

## 2021-09-06 NOTE — Assessment & Plan Note (Signed)
Goals-stable on CT scan ?Discussed with him and we did decide that he was probably low risk and decided not to evaluate further ?Will refer to pulmonary for recurrent lung infections and rule out chronic lung disease ?Also see what their opinion is on monitoring pulmonary nodules ?

## 2021-09-06 NOTE — Assessment & Plan Note (Signed)
Acute ?Symptoms started 4-5 days ago and are like his prior illnesses and pneumonia that required antibiotics ?His lungs sound good today, but he feels like the right lung is congested and feels different ?Given his history I will prescribe him Augmentin 875-125 mg twice daily x10 days ?Continue Advair twice daily, albuterol inhaler as needed ?Continue liquid Mucinex ?He has had recurrent infections like this and I do wonder if he has something underlying, either COPD/asthma, possible bronchiectasis ?Also has some lung nodules that have been stable ?For all of the above reasons we will refer to pulmonary for their opinion ?

## 2021-09-06 NOTE — Assessment & Plan Note (Signed)
Chronic ?Blood pressure well controlled ?Continue hydralazine 50 mg twice daily metoprolol 100 mg twice daily ?

## 2021-09-15 ENCOUNTER — Other Ambulatory Visit: Payer: Self-pay | Admitting: Endocrinology

## 2021-09-15 ENCOUNTER — Other Ambulatory Visit: Payer: Self-pay | Admitting: Internal Medicine

## 2021-09-19 ENCOUNTER — Other Ambulatory Visit: Payer: Self-pay | Admitting: Internal Medicine

## 2021-09-19 ENCOUNTER — Other Ambulatory Visit: Payer: Self-pay | Admitting: Family

## 2021-09-19 DIAGNOSIS — Z86718 Personal history of other venous thrombosis and embolism: Secondary | ICD-10-CM

## 2021-09-19 DIAGNOSIS — Z86711 Personal history of pulmonary embolism: Secondary | ICD-10-CM

## 2021-09-19 MED ORDER — APIXABAN 2.5 MG PO TABS: 2.5000 mg | ORAL_TABLET | Freq: Two times a day (BID) | ORAL | 2 refills | Status: DC

## 2021-09-20 ENCOUNTER — Encounter: Payer: Self-pay | Admitting: Pulmonary Disease

## 2021-09-20 ENCOUNTER — Ambulatory Visit: Payer: Medicare PPO | Admitting: Pulmonary Disease

## 2021-09-20 ENCOUNTER — Other Ambulatory Visit: Payer: Self-pay

## 2021-09-20 VITALS — BP 142/78 | HR 64 | Ht 67.5 in | Wt 217.8 lb

## 2021-09-20 DIAGNOSIS — J42 Unspecified chronic bronchitis: Secondary | ICD-10-CM

## 2021-09-20 MED ORDER — SPIRIVA RESPIMAT 2.5 MCG/ACT IN AERS: 2.0000 | INHALATION_SPRAY | Freq: Every day | RESPIRATORY_TRACT | 2 refills | Status: DC

## 2021-09-20 MED ORDER — SPIRIVA RESPIMAT 2.5 MCG/ACT IN AERS: 2.0000 | INHALATION_SPRAY | Freq: Every day | RESPIRATORY_TRACT | 0 refills | Status: DC

## 2021-09-20 MED ORDER — ALBUTEROL SULFATE (2.5 MG/3ML) 0.083% IN NEBU: 2.5000 mg | INHALATION_SOLUTION | Freq: Four times a day (QID) | RESPIRATORY_TRACT | 5 refills | Status: AC | PRN

## 2021-09-20 NOTE — Progress Notes (Signed)
Subjective:   PATIENT ID: Noah Barnes GENDER: male DOB: [birth year], MRN: [id number]   HPI  Chief Complaint  Patient presents with   Consult    Bronchitis is worse in cold weather and he doesn't have this issue when it's warm     Reason for Visit: New consult for chronic bronchitis  Noah Barnes is a 76 year old male former smoker with HTN, DM2, hx DVT/PE in 2020 who presents as new consult for chronic bronchitis.  He reports many years of chest congestion worsened with cold weather. He has shortness of breath and congested cough. Denies wheezing. No limitation in activity and perform house work. He has been on Advair twice a day for three months and has made minimal improvement. Mucinex is ineffective.   On review of EMR he was seen by his PCP Dr Lawerance Bach for acute visit for right chest tightness/soreness associated with cough. He was treated with augmentin for possible pneumonia. His symptoms have persistent since then for >2 months and are worse than his baseline symptoms.   Social History: Former smoker ~ 7 pack years  I have personally reviewed patient's past medical/family/social history, allergies, current medications.  Past Medical History:  Diagnosis Date   CHF (congestive heart failure) (HCC)    Chronic diastolic heart failure (HCC)    Constipation    Diabetes mellitus    Diverticulitis    Diverticulosis    ED (erectile dysfunction)    Gout    Heart murmur    Hyperlipidemia    Hypertension    IBS (irritable bowel syndrome)    Renal calculus    Renal insufficiency    Tubular adenoma of colon      Family History  Problem Relation Age of Onset   Heart attack Mother 34   Hypertension Mother    Prostate cancer Father 53   Diabetes Father    Irritable bowel syndrome Father    Irritable bowel syndrome Sister        x 2   Colon cancer Brother        dx in his late 40's   Heart disease Brother    Irritable bowel syndrome Brother    Stroke Neg Hx     Esophageal cancer Neg Hx    Rectal cancer Neg Hx    Stomach cancer Neg Hx      Social History   Occupational History   Occupation: Psychologist, counselling: [employer]    Comment: [phone]  Tobacco Use   Smoking status: Former    Packs/day: 0.30    Years: 23.00    Pack years: 6.90    Types: Cigarettes    Quit date: 07/04/1991    Years since quitting: 30.2   Smokeless tobacco: Never   Tobacco comments:    smoked age [age], up to 1/3 ppd; may be less  Vaping Use   Vaping Use: Never used  Substance and Sexual Activity   Alcohol use: Yes    Alcohol/week: 2.0 standard drinks    Types: 2 Glasses of wine per week    Comment: rare   Drug use: No   Sexual activity: Yes    Allergies  Allergen Reactions   Ace Inhibitors Other (See Comments)    Severe AKI due to ARB + dehydration Aug 2016   Angiotensin Receptor Blockers Other (See Comments)    Severe AKI due to ARB + dehydration Aug 2016  Outpatient Medications Prior to Visit  Medication Sig Dispense Refill   Accu-Chek Softclix Lancets lancets CHECK BLOOD SUGAR TWICE DAILT AS DIRECTED 100 each 2   albuterol (VENTOLIN HFA) 108 (90 Base) MCG/ACT inhaler Inhale 2 puffs into the lungs every 6 (six) hours as needed for wheezing or shortness of breath. 8 g 0   allopurinol (ZYLOPRIM) 300 MG tablet Take 1 tablet by mouth once daily 90 tablet 0   apixaban (ELIQUIS) 2.5 MG TABS tablet Take 1 tablet (2.5 mg total) by mouth 2 (two) times daily. 180 tablet 2   atorvastatin (LIPITOR) 20 MG tablet Take 1 tablet by mouth once daily 90 tablet 0   Blood Glucose Monitoring Suppl (ACCU-CHEK GUIDE ME) w/Device KIT 1 each by Does not apply route in the morning and at bedtime. Use Accu Chek Guide me device to check blood sugar twice daily. 1 kit 0   FERREX 150 150 MG capsule Take 1 capsule by mouth once daily 90 capsule 0   Fluticasone-Salmeterol (ADVAIR) 100-50 MCG/DOSE AEPB Inhale 1 puff into the lungs 2 (two) times daily as  needed (chest tightness). 1 each 3   furosemide (LASIX) 40 MG tablet Take 40 mg by mouth daily.     glimepiride (AMARYL) 2 MG tablet TAKE 1 TABLET BY MOUTH ONCE DAILY BEFORE SUPPER 90 tablet 0   glucose blood (ACCU-CHEK GUIDE) test strip USE AS DIRECTED TO TEST BLOOD SUGAR TWICE DAILY 100 strip 3   hydrALAZINE (APRESOLINE) 50 MG tablet Take 1 tablet by mouth twice daily 180 tablet 2   LINZESS 290 MCG CAPS capsule TAKE 1 CAPSULE BY MOUTH ONCE DAILY BEFORE BREAKFAST 90 capsule 1   metoprolol tartrate (LOPRESSOR) 100 MG tablet Take 1 tablet by mouth twice daily 180 tablet 0   RYBELSUS 14 MG TABS TAKE 1 TABLET BY MOUTH IN THE MORNING 30 MINUTES BEFORE BREAKFAST WITH A SIP OF WATER 30 tablet 3   sildenafil (VIAGRA) 100 MG tablet TAKE 1 TABLET BY MOUTH EVERY DAY AS NEEDED FOR ERECTILE DYSFUNCTION 6 tablet 1   tamsulosin (FLOMAX) 0.4 MG CAPS capsule TAKE 1 CAPSULE BY MOUTH ONCE DAILY AFTER SUPPER 90 capsule 0   amoxicillin-clavulanate (AUGMENTIN) 875-125 MG tablet Take 1 tablet by mouth 2 (two) times daily. 20 tablet 0   No facility-administered medications prior to visit.    Review of Systems  Constitutional:  Negative for chills, diaphoresis, fever, malaise/fatigue and weight loss.  HENT:  Negative for congestion.   Respiratory:  Positive for cough. Negative for hemoptysis, sputum production, shortness of breath and wheezing.   Cardiovascular:  Negative for chest pain (Congestion), palpitations and leg swelling.    Objective:   Vitals:   09/20/21 1520  BP: (!) 142/78  Pulse: 64  SpO2: 96%  Weight: 217 lb 12.8 oz (98.8 kg)  Height: 5' 7.5" (1.715 m)   SpO2: 96 % O2 Device: None (Room air)  Physical Exam: General: Well-appearing, no acute distress HENT: [place], AT Eyes: EOMI, no scleral icterus Respiratory: Clear to auscultation bilaterally.  No crackles, wheezing or rales Cardiovascular: RRR, -M/R/G, no JVD Extremities:-Edema,-tenderness Neuro: AAO x4, CNII-XII grossly intact Psych:  Normal mood, normal affect  Data Reviewed:  Imaging: CXR 07/26/21 - No active cardiopulmonary disease. No infiltrate effusion or edema  PFT: None on file  Labs: CBC    Component Value Date/Time   WBC 4.1 03/25/2021 1120   WBC 4.1 12/28/2020 0925   RBC 4.82 03/25/2021 1120   HGB 13.2 03/25/2021 1120   HCT  41.0 03/25/2021 1120   PLT 206 03/25/2021 1120   MCV 85.1 03/25/2021 1120   MCH 27.4 03/25/2021 1120   MCHC 32.2 03/25/2021 1120   RDW 14.1 03/25/2021 1120   LYMPHSABS 1.3 03/25/2021 1120   MONOABS 0.4 03/25/2021 1120   EOSABS 0.1 03/25/2021 1120   BASOSABS 0.0 03/25/2021 1120   Absolute eos 03/25/21 - 100     Assessment & Plan:   Discussion: 76 year old male former smoker with HTN, DM2, hx DVT/PE in 2020 who presents as new consult for chronic bronchitis. Discussed optimizing bronchodilators and pulmonary hygiene. Reports previously good results with nebulizer when he used this and interested in using again. Will evaluate for underlying lung disease with PFTs. May consider CT Chest if work-up is unrevealing.  Chronic bronchitis --CONTINUE Advair 100-50 mcg ONE puff TWICE a day --START Spiriva 2.5 mcg TWO puffs TWICE a day --ARRANGE for pulmonary function tests --ORDER nebulizer and albuterol nebs --ORDER flutter valve  Health Maintenance Immunization History  Administered Date(s) Administered   Fluad Quad(high Dose 65+) 03/06/2019, 05/05/2020, 03/26/2021   Influenza, High Dose Seasonal PF 07/12/2016, 04/10/2017, 03/22/2018   PFIZER Comirnaty(Gray Top)Covid-19 Tri-Sucrose Vaccine 01/14/2021   PFIZER(Purple Top)SARS-COV-2 Vaccination 08/13/2019, 09/10/2019, 04/06/2020   Pfizer Covid-19 Vaccine Bivalent Booster 25yrs & up 06/08/2021   Pneumococcal Conjugate-13 01/06/2019   Pneumococcal Polysaccharide-23 03/01/2020   CT Lung Screen - not qualified. Insufficient tobacco history  Orders Placed This Encounter  Procedures   Ambulatory Referral for DME    Referral  Priority:   Routine    Referral Type:   Durable Medical Equipment Purchase    Number of Visits Requested:   1   Flutter valve    Standing Status:   Future    Standing Expiration Date:   09/21/2022   Pulmonary function test    Standing Status:   Future    Standing Expiration Date:   09/21/2022    Order Specific Question:   Where should this test be performed?    Answer:   [facility]    Order Specific Question:   Full PFT: includes the following: basic spirometry, spirometry pre & post bronchodilator, diffusion capacity (DLCO), lung volumes    Answer:   Full PFT   Meds ordered this encounter  Medications   albuterol (PROVENTIL) (2.5 MG/3ML) 0.083% nebulizer solution    Sig: Take 3 mLs (2.5 mg total) by nebulization every 6 (six) hours as needed for wheezing or shortness of breath.    Dispense:  75 mL    Refill:  5   Tiotropium Bromide Monohydrate (SPIRIVA RESPIMAT) 2.5 MCG/ACT AERS    Sig: Inhale 2 puffs into the lungs daily.    Dispense:  4 g    Refill:  2   Tiotropium Bromide Monohydrate (SPIRIVA RESPIMAT) 2.5 MCG/ACT AERS    Sig: Inhale 2 puffs into the lungs daily.    Dispense:  4 g    Refill:  0    Return in about 2 months (around 11/20/2021).  I have spent a total time of 45-minutes on the day of the appointment reviewing prior documentation, coordinating care and discussing medical diagnosis and plan with the patient/family. Imaging, labs and tests included in this note have been reviewed and interpreted independently by me.  Cardin Nitschke Mechele Collin, MD [facility] 09/20/2021 4:47 PM  Office Number [phone]

## 2021-09-20 NOTE — Patient Instructions (Addendum)
?  Chronic bronchitis ?--CONTINUE Advair 100-50 mcg ONE puff TWICE a day ?--START Spiriva 2.5 mcg TWO puffs TWICE a day ?--ARRANGE for pulmonary function tests ?--ORDER nebulizer and albuterol nebs ?--ORDER flutter valve ? ?Follow-up with me in March, April or May with PFTs prior to visit ?

## 2021-09-23 ENCOUNTER — Ambulatory Visit: Payer: Medicare PPO | Admitting: Family

## 2021-09-23 ENCOUNTER — Other Ambulatory Visit: Payer: Medicare PPO

## 2021-09-26 ENCOUNTER — Inpatient Hospital Stay: Payer: Medicare PPO | Attending: Family

## 2021-09-26 ENCOUNTER — Other Ambulatory Visit: Payer: Self-pay

## 2021-09-26 ENCOUNTER — Inpatient Hospital Stay: Payer: Medicare PPO | Admitting: Family

## 2021-09-26 ENCOUNTER — Other Ambulatory Visit: Payer: Self-pay | Admitting: Internal Medicine

## 2021-09-26 ENCOUNTER — Encounter: Payer: Self-pay | Admitting: Family

## 2021-09-26 VITALS — BP 120/73 | HR 62 | Temp 98.2°F | Resp 17 | Ht 67.0 in | Wt 211.0 lb

## 2021-09-26 DIAGNOSIS — Z86718 Personal history of other venous thrombosis and embolism: Secondary | ICD-10-CM

## 2021-09-26 DIAGNOSIS — Z86711 Personal history of pulmonary embolism: Secondary | ICD-10-CM

## 2021-09-26 DIAGNOSIS — Z7901 Long term (current) use of anticoagulants: Secondary | ICD-10-CM | POA: Insufficient documentation

## 2021-09-26 LAB — CMP (CANCER CENTER ONLY)
ALT: 70 U/L — ABNORMAL HIGH (ref 0–44)
AST: 22 U/L (ref 15–41)
Albumin: 4.3 g/dL (ref 3.5–5.0)
Alkaline Phosphatase: 69 U/L (ref 38–126)
Anion gap: 6 (ref 5–15)
BUN: 42 mg/dL — ABNORMAL HIGH (ref 8–23)
CO2: 18 mmol/L — ABNORMAL LOW (ref 22–32)
Calcium: 9.7 mg/dL (ref 8.9–10.3)
Chloride: 112 mmol/L — ABNORMAL HIGH (ref 98–111)
Creatinine: 1.7 mg/dL — ABNORMAL HIGH (ref 0.61–1.24)
GFR, Estimated: 42 mL/min — ABNORMAL LOW (ref >60)
Glucose, Bld: 99 mg/dL (ref 70–99)
Potassium: 4.2 mmol/L (ref 3.5–5.1)
Sodium: 136 mmol/L (ref 135–145)
Total Bilirubin: 0.8 mg/dL (ref 0.3–1.2)
Total Protein: 7 g/dL (ref 6.5–8.1)

## 2021-09-26 LAB — CBC WITH DIFFERENTIAL (CANCER CENTER ONLY)
Abs Immature Granulocytes: 0.01 10*3/uL (ref 0.00–0.07)
Basophils Absolute: 0 10*3/uL (ref 0.0–0.1)
Basophils Relative: 1 %
Eosinophils Absolute: 0.1 10*3/uL (ref 0.0–0.5)
Eosinophils Relative: 2 %
HCT: 42.3 % (ref 39.0–52.0)
Hemoglobin: 13.5 g/dL (ref 13.0–17.0)
Immature Granulocytes: 0 %
Lymphocytes Relative: 27 %
Lymphs Abs: 1.2 10*3/uL (ref 0.7–4.0)
MCH: 27.1 pg (ref 26.0–34.0)
MCHC: 31.9 g/dL (ref 30.0–36.0)
MCV: 84.8 fL (ref 80.0–100.0)
Monocytes Absolute: 0.4 10*3/uL (ref 0.1–1.0)
Monocytes Relative: 8 %
Neutro Abs: 2.8 10*3/uL (ref 1.7–7.7)
Neutrophils Relative %: 62 %
Platelet Count: 222 10*3/uL (ref 150–400)
RBC: 4.99 MIL/uL (ref 4.22–5.81)
RDW: 14.6 % (ref 11.5–15.5)
WBC Count: 4.4 10*3/uL (ref 4.0–10.5)
nRBC: 0 % (ref 0.0–0.2)

## 2021-09-26 NOTE — Progress Notes (Signed)
?Hematology and Oncology Follow Up Visit ? ?Noah Barnes ?[id number] ?Jul 04, 1945 76 y.o. ?09/26/2021 ? ? 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  ?Current Therapy:        ?Eliquis 2.5 mg p.o. twice daily as long-term maintenance ?  ?Interim History:  Noah Barnes is here today for follow-up. He is doing quite well and has no complaints at this time.  ?He states that he has bronchitis induced by cold weather and was able to see a pulmonologist last week. He states that he has started nebulizer treatments and will have PFT's done next month. His SOB with er exertion seems to be stable.  ?No fever, chills, n/v, cough, rash, dizziness, chest pain, palpitations, abdominal pain or changes in bowel or bladder habits.  ?No bleeding, bruising or petechiae.  ?He states that he is taking his Eliquis as prescribed.  ?No tenderness, numbness or tingling in his extremities.  ?He has minimal swelling in his ankles that resolves when he props his feet up.  ?No falls or syncope to repot.  ?He has maintained a good appetite and is staying well hydrated. His weight is stable at 211 lbs.  ? ?ECOG Performance Status: 1 - Symptomatic but completely ambulatory ? ?Medications:  ?Allergies as of 09/26/2021   ? ?   Reactions  ? Ace Inhibitors Other (See Comments)  ? Severe AKI due to ARB + dehydration Aug 2016  ? Angiotensin Receptor Blockers Other (See Comments)  ? Severe AKI due to ARB + dehydration Aug 2016  ? ?  ? ?  ?Medication List  ?  ? ?  ? Accurate as of September 26, 2021  1:38 PM. If you have any questions, ask your nurse or doctor.  ?  ?  ? ?  ? ?Accu-Chek Guide Me w/Device Kit ?1 each by Does not apply route in the morning and at bedtime. Use Accu Chek Guide me device to check blood sugar twice daily. ?  ?Accu-Chek Guide test strip ?Generic drug: glucose blood ?USE AS DIRECTED TO TEST BLOOD SUGAR TWICE DAILY ?  ?Accu-Chek Softclix Lancets lancets ?CHECK BLOOD SUGAR TWICE DAILT AS DIRECTED ?   ?albuterol 108 (90 Base) MCG/ACT inhaler ?Commonly known as: VENTOLIN HFA ?Inhale 2 puffs into the lungs every 6 (six) hours as needed for wheezing or shortness of breath. ?  ?albuterol (2.5 MG/3ML) 0.083% nebulizer solution ?Commonly known as: PROVENTIL ?Take 3 mLs (2.5 mg total) by nebulization every 6 (six) hours as needed for wheezing or shortness of breath. ?  ?allopurinol 300 MG tablet ?Commonly known as: ZYLOPRIM ?Take 1 tablet by mouth once daily ?  ?apixaban 2.5 MG Tabs tablet ?Commonly known as: Eliquis ?Take 1 tablet (2.5 mg total) by mouth 2 (two) times daily. ?  ?atorvastatin 20 MG tablet ?Commonly known as: LIPITOR ?Take 1 tablet by mouth once daily ?  ?Ferrex 150 150 MG capsule ?Generic drug: iron polysaccharides ?Take 1 capsule by mouth once daily ?  ?Fluticasone-Salmeterol 100-50 MCG/DOSE Aepb ?Commonly known as: ADVAIR ?Inhale 1 puff into the lungs 2 (two) times daily as needed (chest tightness). ?  ?furosemide 40 MG tablet ?Commonly known as: LASIX ?Take 40 mg by mouth daily. ?  ?glimepiride 2 MG tablet ?Commonly known as: AMARYL ?TAKE 1 TABLET BY MOUTH ONCE DAILY BEFORE SUPPER ?  ?hydrALAZINE 50 MG tablet ?Commonly known as: APRESOLINE ?Take 1 tablet by mouth twice daily ?  ?Linzess 290 MCG Caps capsule ?Generic drug: linaclotide ?TAKE 1 CAPSULE BY  MOUTH ONCE DAILY BEFORE BREAKFAST ?  ?metoprolol tartrate 100 MG tablet ?Commonly known as: LOPRESSOR ?Take 1 tablet by mouth twice daily ?  ?Rybelsus 14 MG Tabs ?Generic drug: Semaglutide ?TAKE 1 TABLET BY MOUTH IN THE MORNING 30 MINUTES BEFORE BREAKFAST WITH A SIP OF WATER ?  ?sildenafil 100 MG tablet ?Commonly known as: VIAGRA ?TAKE 1 TABLET BY MOUTH EVERY DAY AS NEEDED FOR ERECTILE DYSFUNCTION ?  ?Spiriva Respimat 2.5 MCG/ACT Aers ?Generic drug: Tiotropium Bromide Monohydrate ?Inhale 2 puffs into the lungs daily. ?  ?Spiriva Respimat 2.5 MCG/ACT Aers ?Generic drug: Tiotropium Bromide Monohydrate ?Inhale 2 puffs into the lungs daily. ?  ?tamsulosin  0.4 MG Caps capsule ?Commonly known as: FLOMAX ?TAKE 1 CAPSULE BY MOUTH ONCE DAILY AFTER SUPPER ?  ? ?  ? ? ?Allergies:  ?Allergies  ?Allergen Reactions  ? Ace Inhibitors Other (See Comments)  ?  Severe AKI due to ARB + dehydration Aug 2016  ? Angiotensin Receptor Blockers Other (See Comments)  ?  Severe AKI due to ARB + dehydration Aug 2016  ? ? ?Past Medical History, Surgical history, Social history, and Family History were reviewed and updated. ? ?Review of Systems: ?All other 10 point review of systems is negative.  ? ?Physical Exam: ? height is '5\' 7"'  (1.702 m) and weight is 211 lb (95.7 kg). His oral temperature is 98.2 ?F (36.8 ?C). His blood pressure is 120/73 and his pulse is 62. His respiration is 17 and oxygen saturation is 99%.  ? ?Wt Readings from Last 3 Encounters:  ?09/26/21 211 lb (95.7 kg)  ?09/20/21 217 lb 12.8 oz (98.8 kg)  ?09/06/21 218 lb (98.9 kg)  ? ? ?Ocular: Sclerae unicteric, pupils equal, round and reactive to light ?Ear-nose-throat: Oropharynx clear, dentition fair ?Lymphatic: No cervical or supraclavicular adenopathy ?Lungs no rales or rhonchi, good excursion bilaterally ?Heart regular rate and rhythm, no murmur appreciated ?Abd soft, nontender, positive bowel sounds ?MSK no focal spinal tenderness, no joint edema ?Neuro: non-focal, well-oriented, appropriate affect ?Breasts: Deferred  ? ?Lab Results  ?Component Value Date  ? WBC 4.4 09/26/2021  ? HGB 13.5 09/26/2021  ? HCT 42.3 09/26/2021  ? MCV 84.8 09/26/2021  ? PLT 222 09/26/2021  ? ?Lab Results  ?Component Value Date  ? FERRITIN 211 03/28/2019  ? IRON 29 (L) 03/28/2019  ? TIBC 216 (L) 03/28/2019  ? UIBC 187 03/28/2019  ? IRONPCTSAT 13 (L) 03/28/2019  ? ?Lab Results  ?Component Value Date  ? RETICCTPCT 1.2 03/28/2019  ? RBC 4.99 09/26/2021  ? ?No results found for: KPAFRELGTCHN, LAMBDASER, KAPLAMBRATIO ?No results found for: IGGSERUM, IGA, IGMSERUM ?No results found for: TOTALPROTELP, ALBUMINELP, A1GS, A2GS, BETS, BETA2SER, GAMS,  MSPIKE, SPEI ?  Chemistry   ?   ?Component Value Date/Time  ? NA 140 05/03/2021 0829  ? K 4.0 05/03/2021 0829  ? CL 112 05/03/2021 0829  ? CO2 20 05/03/2021 0829  ? BUN 23 05/03/2021 0829  ? CREATININE 1.45 05/03/2021 0829  ? CREATININE 1.47 (H) 03/25/2021 1120  ?    ?Component Value Date/Time  ? CALCIUM 8.9 05/03/2021 0829  ? ALKPHOS 67 03/25/2021 1120  ? AST 20 03/25/2021 1120  ? ALT 58 (H) 03/25/2021 1120  ? BILITOT 0.7 03/25/2021 1120  ?  ? ? ? 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 He completed 6 months of full anticoagulation and is now doing well on maintenance  low dose Eliquis lifelong.  ?He continues to do well. No recurrence.  ?No changes to Eliquis regimen.  ?Follow-up in 6 months.  ? ?Lottie Dawson, NP ?3/27/20231:38 PM ? ?

## 2021-09-29 ENCOUNTER — Other Ambulatory Visit (INDEPENDENT_AMBULATORY_CARE_PROVIDER_SITE_OTHER): Payer: Medicare PPO

## 2021-09-29 DIAGNOSIS — E1169 Type 2 diabetes mellitus with other specified complication: Secondary | ICD-10-CM

## 2021-09-29 DIAGNOSIS — E669 Obesity, unspecified: Secondary | ICD-10-CM

## 2021-09-29 LAB — BASIC METABOLIC PANEL
BUN: 31 mg/dL — ABNORMAL HIGH (ref 6–23)
CO2: 20 mEq/L (ref 19–32)
Calcium: 9.3 mg/dL (ref 8.4–10.5)
Chloride: 109 mEq/L (ref 96–112)
Creatinine, Ser: 1.58 mg/dL — ABNORMAL HIGH (ref 0.40–1.50)
GFR: 42.75 mL/min — ABNORMAL LOW (ref >60.00)
Glucose, Bld: 93 mg/dL (ref 70–99)
Potassium: 4.5 mEq/L (ref 3.5–5.1)
Sodium: 138 mEq/L (ref 135–145)

## 2021-09-29 LAB — HEMOGLOBIN A1C: Hgb A1c MFr Bld: 5.9 % (ref 4.6–6.5)

## 2021-09-29 LAB — MICROALBUMIN / CREATININE URINE RATIO
Creatinine,U: 235.2 mg/dL
Microalb Creat Ratio: 1.3 mg/g (ref 0.0–30.0)
Microalb, Ur: 3 mg/dL — ABNORMAL HIGH (ref 0.0–1.9)

## 2021-10-03 ENCOUNTER — Other Ambulatory Visit: Payer: Self-pay | Admitting: Endocrinology

## 2021-10-05 ENCOUNTER — Encounter: Payer: Self-pay | Admitting: Endocrinology

## 2021-10-05 ENCOUNTER — Ambulatory Visit: Payer: Medicare PPO | Admitting: Endocrinology

## 2021-10-05 VITALS — BP 130/82 | HR 69 | Ht 67.25 in | Wt 219.2 lb

## 2021-10-05 DIAGNOSIS — N183 Chronic kidney disease, stage 3 unspecified: Secondary | ICD-10-CM | POA: Diagnosis not present

## 2021-10-05 DIAGNOSIS — E119 Type 2 diabetes mellitus without complications: Secondary | ICD-10-CM

## 2021-10-05 DIAGNOSIS — R600 Localized edema: Secondary | ICD-10-CM

## 2021-10-05 MED ORDER — ACCU-CHEK GUIDE ME W/DEVICE KIT: 1.0000 | PACK | Freq: Two times a day (BID) | 0 refills | Status: AC

## 2021-10-05 NOTE — Progress Notes (Signed)
Patient ID: Noah Barnes, male   DOB: 1945-09-17, 76 y.o.   MRN: [id number]    Reason for Appointment : Follow-up visit - History of Present Illness          Diagnosis: Type 2 diabetes mellitus, date of diagnosis: 2008       Past history: His diabetes was borderline in the beginning and not clear what medications he was started on. Has been taking metformin for several years and this was later changed to Janumet. Amaryl probably started in 5/14 when blood sugars were higher, initially was given 1 mg and now has been taking 2 mg. He had a relatively high A1c of 7.1, difficulty losing weight and a glucose of 202 on his initial consultation in 06/2013 He was then tried on Invokana in addition to his Janumet and Amaryl Invokana was reduced to 100 mg in early 2015 been creatinine had gone up to 1.6  Recent history:   Oral hypoglycemic drugs the patient is taking are: Amaryl 2  mg at supper, Rybelsus 14 mg daily   His A1c is consistently low at 5.9 His previous range 5.8-6.6 Fructosamine last 241  Current management, blood sugar patterns and problems identified:    He did not bring his monitor for review today  He has reportedly had fairly good readings both morning and after meals but recall although unclear how often he is monitoring  Although he did have some fluctuation of his weight last month he appears to be down slightly compared to last year  No hypoglycemia with current dose of Amaryl He does take his Rybelsus consistently before breakfast as directed 30 minutes before eating  He does not like to exercise during winter months but is going to start going to the gym more walking now No nausea with Rybelsus and had no difficulty with affordability  No nausea with Rybelsus  Side effects from medications have been:  renal dysfunction from Invokana  Glucose monitoring:  done <1 times a day         Glucometer:  Accu-Chek   Blood Glucose readings by  recall:   PRE-MEAL Fasting Lunch Dinner Bedtime Overall  Glucose range: 90-110      Mean/median:     ?   POST-MEAL PC Breakfast PC Lunch PC Dinner  Glucose range:   124-135  Mean/median:      Previous data:  PRE-MEAL Fasting Lunch Dinner Bedtime Overall  Glucose range:       Mean/median: 108    113   POST-MEAL PC Breakfast PC Lunch PC Dinner  Glucose range:   107-192  Mean/median:   119   Glycemic control:   Lab Results  Component Value Date   HGBA1C 5.9 09/29/2021   HGBA1C 5.8 05/03/2021   HGBA1C 5.9 12/28/2020   Lab Results  Component Value Date   MICROALBUR 3.0 (H) 09/29/2021   LDLCALC 73 12/28/2020   CREATININE 1.58 (H) 09/29/2021    Self-care: Usually has low-fat diet, eggs in am with some carbohydrate, frequently no lunch     Dietician consultations: 08/2013              Weight history:   Wt Readings from Last 3 Encounters:  10/05/21 219 lb 3.2 oz (99.4 kg)  09/26/21 211 lb (95.7 kg)  09/20/21 217 lb 12.8 oz (98.8 kg)       Allergies as of 10/05/2021       Reactions   Ace Inhibitors Other (See Comments)  Severe AKI due to ARB + dehydration Aug 2016   Angiotensin Receptor Blockers Other (See Comments)   Severe AKI due to ARB + dehydration Aug 2016        Medication List        Accurate as of October 05, 2021  8:59 AM. If you have any questions, ask your nurse or doctor.          Accu-Chek Guide Me w/Device Kit 1 each by Does not apply route in the morning and at bedtime. Use Accu Chek Guide me device to check blood sugar twice daily.   Accu-Chek Guide test strip Generic drug: glucose blood USE AS DIRECTED TO TEST BLOOD SUGAR TWICE DAILY   Accu-Chek Softclix Lancets lancets CHECK BLOOD SUGAR TWICE DAILT AS DIRECTED   albuterol 108 (90 Base) MCG/ACT inhaler Commonly known as: VENTOLIN HFA Inhale 2 puffs into the lungs every 6 (six) hours as needed for wheezing or shortness of breath.   albuterol (2.5 MG/3ML) 0.083% nebulizer  solution Commonly known as: PROVENTIL Take 3 mLs (2.5 mg total) by nebulization every 6 (six) hours as needed for wheezing or shortness of breath.   allopurinol 300 MG tablet Commonly known as: ZYLOPRIM Take 1 tablet by mouth once daily   apixaban 2.5 MG Tabs tablet Commonly known as: Eliquis Take 1 tablet (2.5 mg total) by mouth 2 (two) times daily.   atorvastatin 20 MG tablet Commonly known as: LIPITOR Take 1 tablet by mouth once daily   Ferrex 150 150 MG capsule Generic drug: iron polysaccharides Take 1 capsule by mouth once daily   Fluticasone-Salmeterol 100-50 MCG/DOSE Aepb Commonly known as: ADVAIR Inhale 1 puff into the lungs 2 (two) times daily as needed (chest tightness).   furosemide 40 MG tablet Commonly known as: LASIX Take 40 mg by mouth daily.   glimepiride 2 MG tablet Commonly known as: AMARYL TAKE 1 TABLET BY MOUTH ONCE DAILY BEFORE SUPPER   hydrALAZINE 50 MG tablet Commonly known as: APRESOLINE Take 1 tablet by mouth twice daily   Linzess 290 MCG Caps capsule Generic drug: linaclotide TAKE 1 CAPSULE BY MOUTH ONCE DAILY BEFORE BREAKFAST   metoprolol tartrate 100 MG tablet Commonly known as: LOPRESSOR Take 1 tablet by mouth twice daily   Rybelsus 14 MG Tabs Generic drug: Semaglutide TAKE 1 TABLET BY MOUTH IN THE MORNING 30 MINUTES BEFORE BREAKFAST WITH A SIP OF WATER   sildenafil 100 MG tablet Commonly known as: VIAGRA TAKE 1 TABLET BY MOUTH EVERY DAY AS NEEDED FOR ERECTILE DYSFUNCTION   Spiriva Respimat 2.5 MCG/ACT Aers Generic drug: Tiotropium Bromide Monohydrate Inhale 2 puffs into the lungs daily.   Spiriva Respimat 2.5 MCG/ACT Aers Generic drug: Tiotropium Bromide Monohydrate Inhale 2 puffs into the lungs daily.   tamsulosin 0.4 MG Caps capsule Commonly known as: FLOMAX TAKE 1 CAPSULE BY MOUTH ONCE DAILY AFTER SUPPER        Allergies:  Allergies  Allergen Reactions   Ace Inhibitors Other (See Comments)    Severe AKI due to  ARB + dehydration Aug 2016   Angiotensin Receptor Blockers Other (See Comments)    Severe AKI due to ARB + dehydration Aug 2016    Past Medical History:  Diagnosis Date   CHF (congestive heart failure) (HCC)    Chronic diastolic heart failure (HCC)    Constipation    Diabetes mellitus    Diverticulitis    Diverticulosis    ED (erectile dysfunction)    Gout    Heart  murmur    Hyperlipidemia    Hypertension    IBS (irritable bowel syndrome)    Renal calculus    Renal insufficiency    Tubular adenoma of colon     Past Surgical History:  Procedure Laterality Date   COLONOSCOPY  1998   negative; no F/U   EXTRACORPOREAL SHOCK WAVE LITHOTRIPSY Left 03/20/2019   Procedure: EXTRACORPOREAL SHOCK WAVE LITHOTRIPSY (ESWL);  Surgeon: Malen Gauze, MD;  Location: [facility];  Service: Urology;  Laterality: Left;   knee effusion tapped      Family History  Problem Relation Age of Onset   Heart attack Mother 51   Hypertension Mother    Prostate cancer Father 86   Diabetes Father    Irritable bowel syndrome Father    Irritable bowel syndrome Sister        x 2   Colon cancer Brother        dx in his late 24's   Heart disease Brother    Irritable bowel syndrome Brother    Stroke Neg Hx    Esophageal cancer Neg Hx    Rectal cancer Neg Hx    Stomach cancer Neg Hx     Social History:  reports that he quit smoking about 30 years ago. His smoking use included cigarettes. He has a 6.90 pack-year smoking history. He has never used smokeless tobacco. He reports current alcohol use of about 2.0 standard drinks per week. He reports that he does not use drugs.    Review of Systems   RENAL dysfunction: Creatinine is as follows, followed by nephrologist  Lab Results  Component Value Date   CREATININE 1.58 (H) 09/29/2021   CREATININE 1.70 (H) 09/26/2021   CREATININE 1.45 05/03/2021   CREATININE 1.47 (H) 03/25/2021     Lab Results  Component Value Date   K 4.5 09/29/2021          Lipids: Has been on Lipitor 20 mg for hyperlipidemia, baseline LDL about 162 Followed by Dr. Lawerance Bach  Lab Results  Component Value Date   CHOL 132 12/28/2020   HDL 47.70 12/28/2020   LDLCALC 73 12/28/2020   LDLDIRECT 80.1 04/15/2014   TRIG 59.0 12/28/2020   CHOLHDL 3 12/28/2020       The blood pressure has been high for several years and usually well controlled.  Currently taking, hydralazine, and metoprolol from his PCP Also monitoring at home  BP Readings from Last 3 Encounters:  10/05/21 130/82  09/26/21 120/73  09/20/21 (!) 142/78   EDEMA: He likely is not taking his Lasix regularly as he appears to have some edema today   LABS:  Lab on 09/29/2021  Component Date Value Ref Range Status   Microalb, Ur 09/29/2021 3.0 (H)  0.0 - 1.9 mg/dL Final   Creatinine,U 16/04/9603 235.2  mg/dL Final   Microalb Creat Ratio 09/29/2021 1.3  0.0 - 30.0 mg/g Final   Sodium 09/29/2021 138  135 - 145 mEq/L Final   Potassium 09/29/2021 4.5  3.5 - 5.1 mEq/L Final   Chloride 09/29/2021 109  96 - 112 mEq/L Final   CO2 09/29/2021 20  19 - 32 mEq/L Final   Glucose, Bld 09/29/2021 93  70 - 99 mg/dL Final   BUN 54/03/8118 31 (H)  6 - 23 mg/dL Final   Creatinine, Ser 09/29/2021 1.58 (H)  0.40 - 1.50 mg/dL Final   GFR 14/78/2956 42.75 (L)  >60.00 mL/min Final   Calculated using the CKD-EPI Creatinine Equation (2021)  Calcium 09/29/2021 9.3  8.4 - 10.5 mg/dL Final   Hgb Q6V MFr Bld 09/29/2021 5.9  4.6 - 6.5 % Final   Glycemic Control Guidelines for People with Diabetes:Non Diabetic:  <6%Goal of Therapy: <7%Additional Action Suggested:  >8%     Physical Examination:  BP 130/82   Pulse 69   Ht 5' 7.25" (1.708 m)   Wt 219 lb 3.2 oz (99.4 kg)   SpO2 95%   BMI 34.08 kg/m    Diabetic Foot Exam - Simple   Simple Foot Form Diabetic Foot exam was performed with the following findings: Yes   Visual Inspection No deformities, no ulcerations, no other skin breakdown bilaterally: Yes Sensation  Testing Intact to touch and monofilament testing bilaterally: Yes Pulse Check Posterior Tibialis and Dorsalis pulse intact bilaterally: Yes Comments    2+ pedal edema present  ASSESSMENT/PLAN:   Diabetes type 2, with obesity  See history of present illness for detailed discussion of current diabetes management, blood sugar patterns and problems identified  He is on Rybelsus 14 mg and Amaryl 2 mg  A1c is 5.9 and stable  Blood sugars are appearing to be very consistently controlled with his regimen of 14 mg of Rybelsus and 2 mg Amaryl Also no hypoglycemia  Reminded him to check more readings after meals Does need to exercise regularly and he will start going to the gym at least 3 days a week  Foot exam does not show signs of neuropathy or vascular disease  RENAL dysfunction: His creatinine is inconsistent as before, to be followed by nephrologist Again microalbumin is normal  Encouraged him to take extra Lasix when he has lower edema Continue follow-up with PCP for annual lipid check and preventive care  There are no Patient Instructions on file for this visit.    Reather Littler 10/05/2021, 8:59 AM

## 2021-10-13 DIAGNOSIS — R051 Acute cough: Secondary | ICD-10-CM | POA: Diagnosis not present

## 2021-10-13 DIAGNOSIS — J42 Unspecified chronic bronchitis: Secondary | ICD-10-CM | POA: Diagnosis not present

## 2021-10-17 ENCOUNTER — Other Ambulatory Visit: Payer: Self-pay | Admitting: Internal Medicine

## 2021-10-19 DIAGNOSIS — N1831 Chronic kidney disease, stage 3a: Secondary | ICD-10-CM | POA: Diagnosis not present

## 2021-10-19 DIAGNOSIS — I129 Hypertensive chronic kidney disease with stage 1 through stage 4 chronic kidney disease, or unspecified chronic kidney disease: Secondary | ICD-10-CM | POA: Diagnosis not present

## 2021-10-19 DIAGNOSIS — D631 Anemia in chronic kidney disease: Secondary | ICD-10-CM | POA: Diagnosis not present

## 2021-10-19 DIAGNOSIS — N2581 Secondary hyperparathyroidism of renal origin: Secondary | ICD-10-CM | POA: Diagnosis not present

## 2021-10-19 DIAGNOSIS — E1122 Type 2 diabetes mellitus with diabetic chronic kidney disease: Secondary | ICD-10-CM | POA: Diagnosis not present

## 2021-10-25 ENCOUNTER — Other Ambulatory Visit: Payer: Medicare PPO

## 2021-10-25 ENCOUNTER — Ambulatory Visit: Payer: Medicare PPO | Admitting: Family

## 2021-10-26 ENCOUNTER — Other Ambulatory Visit: Payer: Self-pay | Admitting: Internal Medicine

## 2021-11-03 ENCOUNTER — Ambulatory Visit: Payer: Medicare PPO | Admitting: Pulmonary Disease

## 2021-11-03 ENCOUNTER — Encounter: Payer: Self-pay | Admitting: Pulmonary Disease

## 2021-11-03 ENCOUNTER — Ambulatory Visit (INDEPENDENT_AMBULATORY_CARE_PROVIDER_SITE_OTHER): Payer: Medicare PPO | Admitting: Pulmonary Disease

## 2021-11-03 VITALS — BP 122/76 | HR 55 | Temp 98.1°F | Ht 67.0 in | Wt 219.0 lb

## 2021-11-03 DIAGNOSIS — J42 Unspecified chronic bronchitis: Secondary | ICD-10-CM | POA: Diagnosis not present

## 2021-11-03 LAB — PULMONARY FUNCTION TEST
DL/VA % pred: 97 %
DL/VA: 3.95 ml/min/mmHg/L
DLCO cor % pred: 80 %
DLCO cor: 18.58 ml/min/mmHg
DLCO unc % pred: 80 %
DLCO unc: 18.58 ml/min/mmHg
FEF 25-75 Post: 3.07 L/sec
FEF 25-75 Pre: 2.73 L/sec
FEF2575-%Change-Post: 12 %
FEF2575-%Pred-Post: 150 %
FEF2575-%Pred-Pre: 133 %
FEV1-%Change-Post: 3 %
FEV1-%Pred-Post: 95 %
FEV1-%Pred-Pre: 92 %
FEV1-Post: 2.35 L
FEV1-Pre: 2.28 L
FEV1FVC-%Change-Post: 0 %
FEV1FVC-%Pred-Pre: 111 %
FEV6-%Change-Post: 3 %
FEV6-%Pred-Post: 88 %
FEV6-%Pred-Pre: 86 %
FEV6-Post: 2.78 L
FEV6-Pre: 2.7 L
FEV6FVC-%Change-Post: 0 %
FEV6FVC-%Pred-Post: 105 %
FEV6FVC-%Pred-Pre: 105 %
FVC-%Change-Post: 3 %
FVC-%Pred-Post: 83 %
FVC-%Pred-Pre: 81 %
FVC-Post: 2.78 L
FVC-Pre: 2.7 L
Post FEV1/FVC ratio: 84 %
Post FEV6/FVC ratio: 100 %
Pre FEV1/FVC ratio: 84 %
Pre FEV6/FVC Ratio: 100 %
RV % pred: 107 %
RV: 2.53 L
TLC % pred: 81 %
TLC: 5.27 L

## 2021-11-03 NOTE — Patient Instructions (Signed)
Chronic bronchitis ?--CONTINUE Advair 100-50 mcg ONE puff TWICE a day ?--CONTINUE Spiriva 2.5 mcg TWO puffs TWICE a day ?--CONTINUE albuterol nebulizer AS NEEDED for shortness of breath or wheezing ?--Use flutter valve as needed for chest congestion ? ?Follow-up with me in 6 months ?

## 2021-11-03 NOTE — Progress Notes (Signed)
PFT done today. 

## 2021-11-03 NOTE — Progress Notes (Signed)
Subjective:   PATIENT ID: Noah Barnes GENDER: male DOB: 1946/05/31, MRN: [id number]   HPI  Chief Complaint  Patient presents with   Follow-up    Follow-up after PFT. No complaints of coughing, wheezing, SOB. Feeling of congestion and tightness but improving.    Reason for Visit: Follow-up  Mr. Adler Ludovico is a 76 year old male former smoker with HTN, DM2, hx DVT/PE in 2020 who presents for follow-up for chronic bronchitis.  Synopsis: He reports many years of chest congestion worsened with cold weather. He has shortness of breath and congested cough. Denies wheezing. No limitation in activity and perform house work. He has been on Advair twice a day for three months and has made minimal improvement. Mucinex is ineffective.  On review of EMR he was seen by his PCP Dr Lawerance Bach for acute visit for right chest tightness/soreness associated with cough. He was treated with augmentin for possible pneumonia. His symptoms have persistent since then for >2 months and are worse than his baseline symptoms.   11/03/21 Since our last visit he had Spiriva added to his Advair and rx flutter valve. He has been compliant with his inhalers. Uses nebulizer once a day. Has not used flutter valve. Feels his current regimen is working for him. Cold weather usually triggers him and causes chest tightening. With warmer weather he symptoms improved. Denies cough. Shortness of breath with exertion. No current chest tightness.  Social History: Former smoker ~ 7 pack years   Past Medical History:  Diagnosis Date   CHF (congestive heart failure) (HCC)    Chronic diastolic heart failure (HCC)    Constipation    Diabetes mellitus    Diverticulitis    Diverticulosis    ED (erectile dysfunction)    Gout    Heart murmur    Hyperlipidemia    Hypertension    IBS (irritable bowel syndrome)    Renal calculus    Renal insufficiency    Tubular adenoma of colon      Family History  Problem Relation Age of  Onset   Heart attack Mother 47   Hypertension Mother    Prostate cancer Father 22   Diabetes Father    Irritable bowel syndrome Father    Irritable bowel syndrome Sister        x 2   Colon cancer Brother        dx in his late 53's   Heart disease Brother    Irritable bowel syndrome Brother    Stroke Neg Hx    Esophageal cancer Neg Hx    Rectal cancer Neg Hx    Stomach cancer Neg Hx      Social History   Occupational History   Occupation: Psychologist, counselling: [employer]    Comment: [phone]  Tobacco Use   Smoking status: Former    Packs/day: 0.30    Years: 23.00    Pack years: 6.90    Types: Cigarettes    Quit date: 07/04/1991    Years since quitting: 30.3   Smokeless tobacco: Never   Tobacco comments:    smoked age [age], up to 1/3 ppd; may be less  Vaping Use   Vaping Use: Never used  Substance and Sexual Activity   Alcohol use: Yes    Alcohol/week: 2.0 standard drinks    Types: 2 Glasses of wine per week    Comment: rare   Drug use: No  Sexual activity: Yes    Allergies  Allergen Reactions   Ace Inhibitors Other (See Comments)    Severe AKI due to ARB + dehydration Aug 2016   Angiotensin Receptor Blockers Other (See Comments)    Severe AKI due to ARB + dehydration Aug 2016     Outpatient Medications Prior to Visit  Medication Sig Dispense Refill   Accu-Chek Softclix Lancets lancets CHECK BLOOD SUGAR TWICE DAILT AS DIRECTED 100 each 2   albuterol (PROVENTIL) (2.5 MG/3ML) 0.083% nebulizer solution Take 3 mLs (2.5 mg total) by nebulization every 6 (six) hours as needed for wheezing or shortness of breath. 75 mL 5   albuterol (VENTOLIN HFA) 108 (90 Base) MCG/ACT inhaler Inhale 2 puffs into the lungs every 6 (six) hours as needed for wheezing or shortness of breath. 8 g 0   allopurinol (ZYLOPRIM) 300 MG tablet Take 1 tablet by mouth once daily 90 tablet 0   apixaban (ELIQUIS) 2.5 MG TABS tablet Take 1 tablet (2.5 mg total) by mouth 2  (two) times daily. 180 tablet 2   atorvastatin (LIPITOR) 20 MG tablet Take 1 tablet by mouth once daily 90 tablet 0   Blood Glucose Monitoring Suppl (ACCU-CHEK GUIDE ME) w/Device KIT 1 each by Does not apply route in the morning and at bedtime. Use Accu Chek Guide me device to check blood sugar twice daily. 1 kit 0   FERREX 150 150 MG capsule Take 1 capsule by mouth once daily 90 capsule 0   Fluticasone-Salmeterol (ADVAIR) 100-50 MCG/DOSE AEPB Inhale 1 puff into the lungs 2 (two) times daily as needed (chest tightness). 1 each 3   furosemide (LASIX) 40 MG tablet Take 40 mg by mouth daily.     glimepiride (AMARYL) 2 MG tablet TAKE 1 TABLET BY MOUTH ONCE DAILY BEFORE SUPPER 90 tablet 0   glucose blood (ACCU-CHEK GUIDE) test strip USE AS DIRECTED TO TEST BLOOD SUGAR TWICE DAILY 100 strip 3   hydrALAZINE (APRESOLINE) 50 MG tablet Take 1 tablet by mouth twice daily 180 tablet 2   LINZESS 290 MCG CAPS capsule TAKE 1 CAPSULE BY MOUTH ONCE DAILY BEFORE BREAKFAST 90 capsule 1   metoprolol tartrate (LOPRESSOR) 100 MG tablet Take 1 tablet by mouth twice daily 180 tablet 0   RYBELSUS 14 MG TABS TAKE 1 TABLET BY MOUTH IN THE MORNING 30 MINUTES BEFORE BREAKFAST WITH A SIP OF WATER 30 tablet 3   sildenafil (VIAGRA) 100 MG tablet TAKE 1 TABLET BY MOUTH EVERY DAY AS NEEDED FOR ERECTILE DYSFUNCTION 6 tablet 1   tamsulosin (FLOMAX) 0.4 MG CAPS capsule TAKE 1 CAPSULE BY MOUTH ONCE DAILY AFTER SUPPER 90 capsule 0   Tiotropium Bromide Monohydrate (SPIRIVA RESPIMAT) 2.5 MCG/ACT AERS Inhale 2 puffs into the lungs daily. 4 g 2   Tiotropium Bromide Monohydrate (SPIRIVA RESPIMAT) 2.5 MCG/ACT AERS Inhale 2 puffs into the lungs daily. 4 g 0   No facility-administered medications prior to visit.    Review of Systems  Constitutional:  Negative for chills, diaphoresis, fever, malaise/fatigue and weight loss.  HENT:  Negative for congestion.   Respiratory:  Positive for shortness of breath. Negative for cough, hemoptysis,  sputum production and wheezing.   Cardiovascular:  Negative for chest pain, palpitations and leg swelling.    Objective:   Vitals:   11/03/21 1408  BP: 122/76  Pulse: (!) 55  Temp: 98.1 F (36.7 C)  TempSrc: Oral  SpO2: 100%  Weight: 219 lb (99.3 kg)  Height: 5\' 7"  (1.702 m)  SpO2: 100 % (RA)  Physical Exam: General: Well-appearing, no acute distress HENT: [place], AT Eyes: EOMI, no scleral icterus Respiratory: Clear to auscultation bilaterally.  No crackles, wheezing or rales Cardiovascular: RRR, -M/R/G, no JVD Extremities:-Edema,-tenderness Neuro: AAO x4, CNII-XII grossly intact Psych: Normal mood, normal affect  Data Reviewed:  Imaging: CXR 07/26/21 - No active cardiopulmonary disease. No infiltrate effusion or edema  PFT: 11/03/21 FVC 2.78 (83%) FEV1 2.35 (95%) Ratio 84  TLC 81% DLCO 80% Interpretation: No obstructive or restrictive defect. Normal lung volumes and normal DLCO.  Labs: CBC    Component Value Date/Time   WBC 4.4 09/26/2021 1301   WBC 4.1 12/28/2020 0925   RBC 4.99 09/26/2021 1301   HGB 13.5 09/26/2021 1301   HCT 42.3 09/26/2021 1301   PLT 222 09/26/2021 1301   MCV 84.8 09/26/2021 1301   MCH 27.1 09/26/2021 1301   MCHC 31.9 09/26/2021 1301   RDW 14.6 09/26/2021 1301   LYMPHSABS 1.2 09/26/2021 1301   MONOABS 0.4 09/26/2021 1301   EOSABS 0.1 09/26/2021 1301   BASOSABS 0.0 09/26/2021 1301   Absolute eos 03/25/21 - 100     Assessment & Plan:   Discussion: 76 year old male former smoker with HTN, DM2, hx DVT/PE in 2020 who presents for follow-up. PFTs reviewed and normal on current bronchodilator regimen. If symptoms worsen in the winter time we can increase ICS/LABA and/or nebulizer use for symptom control.  Chronic bronchitis --CONTINUE Advair 100-50 mcg ONE puff TWICE a day --CONTINUE Spiriva 2.5 mcg TWO puffs TWICE a day --CONTINUE albuterol nebulizer AS NEEDED for shortness of breath or wheezing --Use flutter valve as needed for chest  congestion  Health Maintenance Immunization History  Administered Date(s) Administered   Fluad Quad(high Dose 65+) 03/06/2019, 05/05/2020, 03/26/2021   Influenza, High Dose Seasonal PF 07/12/2016, 04/10/2017, 03/22/2018   PFIZER Comirnaty(Gray Top)Covid-19 Tri-Sucrose Vaccine 01/14/2021   PFIZER(Purple Top)SARS-COV-2 Vaccination 08/13/2019, 09/10/2019, 04/06/2020   Pfizer Covid-19 Vaccine Bivalent Booster 23yrs & up 06/08/2021   Pneumococcal Conjugate-13 01/06/2019   Pneumococcal Polysaccharide-23 03/01/2020   CT Lung Screen - not qualified. Insufficient tobacco history  No orders of the defined types were placed in this encounter.  No orders of the defined types were placed in this encounter.   Return in about 6 months (around 05/06/2022).  I have spent a total time of 32-minutes on the day of the appointment including chart review, data review, collecting history, coordinating care and discussing medical diagnosis and plan with the patient/family. Past medical history, allergies, medications were reviewed. Pertinent imaging, labs and tests included in this note have been reviewed and interpreted independently by me.  Karsynn Deweese Mechele Collin, MD [facility] 11/03/2021 2:13 PM  Office Number [phone]

## 2021-11-22 ENCOUNTER — Other Ambulatory Visit: Payer: Self-pay | Admitting: Internal Medicine

## 2021-12-07 ENCOUNTER — Other Ambulatory Visit: Payer: Self-pay | Admitting: Endocrinology

## 2021-12-09 ENCOUNTER — Other Ambulatory Visit: Payer: Self-pay

## 2021-12-09 DIAGNOSIS — E669 Obesity, unspecified: Secondary | ICD-10-CM

## 2021-12-09 DIAGNOSIS — E1169 Type 2 diabetes mellitus with other specified complication: Secondary | ICD-10-CM

## 2021-12-09 MED ORDER — RYBELSUS 14 MG PO TABS: ORAL_TABLET | ORAL | 3 refills | Status: DC

## 2021-12-19 ENCOUNTER — Other Ambulatory Visit: Payer: Self-pay | Admitting: Internal Medicine

## 2021-12-21 ENCOUNTER — Other Ambulatory Visit: Payer: Self-pay | Admitting: Internal Medicine

## 2022-01-02 ENCOUNTER — Other Ambulatory Visit: Payer: Self-pay | Admitting: Endocrinology

## 2022-01-04 ENCOUNTER — Other Ambulatory Visit: Payer: Self-pay | Admitting: Endocrinology

## 2022-01-05 ENCOUNTER — Telehealth: Payer: Self-pay | Admitting: Internal Medicine

## 2022-01-05 NOTE — Telephone Encounter (Signed)
Left message for patient to call back to schedule Medicare Annual Wellness Visit   Last AWV  12/29/20  Please schedule at anytime with [facility] if patient calls the office back.     Any questions, please call me at [phone]

## 2022-01-07 ENCOUNTER — Other Ambulatory Visit: Payer: Self-pay | Admitting: Internal Medicine

## 2022-01-10 DIAGNOSIS — I509 Heart failure, unspecified: Secondary | ICD-10-CM | POA: Diagnosis not present

## 2022-01-10 DIAGNOSIS — M109 Gout, unspecified: Secondary | ICD-10-CM | POA: Diagnosis not present

## 2022-01-10 DIAGNOSIS — I130 Hypertensive heart and chronic kidney disease with heart failure and stage 1 through stage 4 chronic kidney disease, or unspecified chronic kidney disease: Secondary | ICD-10-CM | POA: Diagnosis not present

## 2022-01-10 DIAGNOSIS — E669 Obesity, unspecified: Secondary | ICD-10-CM | POA: Diagnosis not present

## 2022-01-10 DIAGNOSIS — J42 Unspecified chronic bronchitis: Secondary | ICD-10-CM | POA: Diagnosis not present

## 2022-01-10 DIAGNOSIS — E1122 Type 2 diabetes mellitus with diabetic chronic kidney disease: Secondary | ICD-10-CM | POA: Diagnosis not present

## 2022-01-10 DIAGNOSIS — E785 Hyperlipidemia, unspecified: Secondary | ICD-10-CM | POA: Diagnosis not present

## 2022-01-10 DIAGNOSIS — K589 Irritable bowel syndrome without diarrhea: Secondary | ICD-10-CM | POA: Diagnosis not present

## 2022-01-10 DIAGNOSIS — E261 Secondary hyperaldosteronism: Secondary | ICD-10-CM | POA: Diagnosis not present

## 2022-01-10 NOTE — Progress Notes (Signed)
Cardiology Office Note:    Date:  01/11/2022   ID:  Noah Barnes, DOB 12/27/1945, MRN [id number]  PCP:  Pincus Sanes, MD  [facility] Cardiologist:  Tonny Bollman, MD  [facility] Electrophysiologist:  None   Chief Complaint: yearly follow up   History of Present Illness:    Noah Barnes is a 76 y.o. 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   He was admitted in 2016 with acute renal failure.  An echocardiogram during that admission demonstrated normal LV function with inferior hypokinesis.  Follow-up nuclear stress test in 5/17 demonstrated inferior wall defect.  There was no ischemia.  He has been unable to tolerate ACE or ARB secondary to history of severe acute kidney injury on these agents.   Last echocardiogram September 2020 with LV function of 60 to 65%, mild LVH, impaired diastolic dysfunction.   Last seen by me 12/2020.  Here today for follow up.  No complaints.  He goes to [facility] 3 days/week and does walking for 2 miles without any problem.  He denies chest pain, shortness of breath, orthopnea, PND, syncope, palpitation, dizziness, melena or blood in the stool or urine.  He has chronic mild bilateral lower extremity edema.  Has chronic bronchitis.  PFT was reassuring recently.  Past Medical History:  Diagnosis Date   CHF (congestive heart failure) (HCC)    Chronic diastolic heart failure (HCC)    Constipation    Diabetes mellitus    Diverticulitis    Diverticulosis    ED (erectile dysfunction)    Gout    Heart murmur    Hyperlipidemia    Hypertension    IBS (irritable bowel syndrome)    Renal calculus    Renal insufficiency    Tubular adenoma of colon     Past Surgical History:  Procedure Laterality  Date   COLONOSCOPY  1998   negative; no F/U   EXTRACORPOREAL SHOCK WAVE LITHOTRIPSY Left 03/20/2019   Procedure: EXTRACORPOREAL SHOCK WAVE LITHOTRIPSY (ESWL);  Surgeon: Malen Gauze, MD;  Location: [facility];  Service: Urology;  Laterality: Left;   knee effusion tapped      Current Medications: Current Meds  Medication Sig   Accu-Chek Softclix Lancets lancets CHECK BLOOD SUGAR TWICE DAILY AS DIRECTED   albuterol (PROVENTIL) (2.5 MG/3ML) 0.083% nebulizer solution Take 3 mLs (2.5 mg total) by nebulization every 6 (six) hours as needed for wheezing or shortness of breath.   albuterol (VENTOLIN HFA) 108 (90 Base) MCG/ACT inhaler Inhale 2 puffs into the lungs every 6 (six) hours as needed for wheezing or shortness of breath.   allopurinol (ZYLOPRIM) 300 MG tablet Take 1 tablet by mouth once daily   apixaban (ELIQUIS) 2.5 MG TABS tablet Take 1 tablet (2.5 mg total) by mouth 2 (two) times daily.   atorvastatin (LIPITOR) 20 MG tablet Take 1 tablet by mouth once daily   Blood Glucose Monitoring Suppl (ACCU-CHEK GUIDE ME) w/Device KIT 1 each by Does not apply route in the morning and at bedtime. Use Accu Chek Guide me device to check blood sugar twice daily.   FERREX 150 150 MG capsule Take 1 capsule by mouth once daily   Fluticasone-Salmeterol (ADVAIR) 100-50 MCG/DOSE AEPB Inhale 1 puff into the lungs 2 (two) times daily as  needed (chest tightness).   furosemide (LASIX) 40 MG tablet Take 40 mg by mouth daily.   glimepiride (AMARYL) 2 MG tablet TAKE 1 TABLET BY MOUTH ONCE DAILY BEFORE SUPPER   glucose blood (ACCU-CHEK GUIDE) test strip USE AS DIRECTED TO TEST BLOOD SUGAR TWICE DAILY   hydrALAZINE (APRESOLINE) 50 MG tablet Take 1 tablet (50 mg total) by mouth 2 (two) times daily. Follow-up appt due in July must see provider for future refills   LINZESS 290 MCG CAPS capsule TAKE 1 CAPSULE BY MOUTH ONCE DAILY BEFORE BREAKFAST   metoprolol tartrate (LOPRESSOR) 100 MG tablet Take 1 tablet by mouth twice  daily   Semaglutide (RYBELSUS) 14 MG TABS TAKE 1 TABLET BY MOUTH IN THE MORNING 30 MINUTES BEFORE BREAKFAST WITH A SIP OF WATER   sildenafil (VIAGRA) 100 MG tablet TAKE 1 TABLET BY MOUTH EVERY DAY AS NEEDED FOR ERECTILE DYSFUNCTION   tamsulosin (FLOMAX) 0.4 MG CAPS capsule TAKE 1 CAPSULE BY MOUTH ONCE DAILY AFTER SUPPER   Tiotropium Bromide Monohydrate (SPIRIVA RESPIMAT) 2.5 MCG/ACT AERS Inhale 2 puffs into the lungs daily.   [DISCONTINUED] Tiotropium Bromide Monohydrate (SPIRIVA RESPIMAT) 2.5 MCG/ACT AERS Inhale 2 puffs into the lungs daily.     Allergies:   Ace inhibitors and Angiotensin receptor blockers   Social History   Socioeconomic History   Marital status: Married    Spouse name: Not on file   Number of children: 1   Years of education: Not on file   Highest education level: Not on file  Occupational History   Occupation: Psychologist, counselling: [employer]    Comment: [phone]  Tobacco Use   Smoking status: Former    Packs/day: 0.30    Years: 23.00    Total pack years: 6.90    Types: Cigarettes    Quit date: 07/04/1991    Years since quitting: 30.5   Smokeless tobacco: Never   Tobacco comments:    smoked age [age], up to 1/3 ppd; may be less  Vaping Use   Vaping Use: Never used  Substance and Sexual Activity   Alcohol use: Yes    Alcohol/week: 2.0 standard drinks of alcohol    Types: 2 Glasses of wine per week    Comment: rare   Drug use: No   Sexual activity: Yes  Other Topics Concern   Not on file  Social History Narrative   Not on file   Social Determinants of Health   Financial Resource Strain: Low Risk  (12/29/2020)   Overall Financial Resource Strain (CARDIA)    Difficulty of Paying Living Expenses: Not hard at all  Food Insecurity: No Food Insecurity (12/29/2020)   Hunger Vital Sign    Worried About Running Out of Food in the Last Year: Never true    Ran Out of Food in the Last Year: Never true  Transportation Needs: No  Transportation Needs (12/29/2020)   PRAPARE - Administrator, Civil Service (Medical): No    Lack of Transportation (Non-Medical): No  Physical Activity: Inactive (12/29/2020)   Exercise Vital Sign    Days of Exercise per Week: 0 days    Minutes of Exercise per Session: 0 min  Stress: No Stress Concern Present (12/29/2020)   [employer] of Occupational Health - Occupational Stress Questionnaire    Feeling of Stress : Not at all  Social Connections: Socially Integrated (12/29/2020)   Social Connection and Isolation Panel [NHANES]    Frequency  of Communication with Friends and Family: More than three times a week    Frequency of Social Gatherings with Friends and Family: More than three times a week    Attends Religious Services: More than 4 times per year    Active Member of [employer] or Organizations: Yes    Attends Engineer, structural: More than 4 times per year    Marital Status: Married     Family History: The patient's family history includes Colon cancer in his brother; Diabetes in his father; Heart attack (age of onset: 68) in his mother; Heart disease in his brother; Hypertension in his mother; Irritable bowel syndrome in his brother, father, and sister; Prostate cancer (age of onset: 45) in his father. There is no history of Stroke, Esophageal cancer, Rectal cancer, or Stomach cancer.    ROS:   Please see the history of present illness.    All other systems reviewed and are negative.   EKGs/Labs/Other Studies Reviewed:    The following studies were reviewed today:  Echo 03/2019 1. Left ventricular ejection fraction, by visual estimation, is 60 to  65%. The left ventricle has normal function. Normal left ventricular size.  There is mildly increased left ventricular hypertrophy.   2. Left ventricular diastolic Doppler parameters are consistent with  impaired relaxation pattern of LV diastolic filling.   3. Global right ventricle has normal systolic  function.The right  ventricular size is normal. No increase in right ventricular wall  thickness.   4. Left atrial size was normal.   5. Right atrial size was normal.   6. The mitral valve is normal in structure. No evidence of mitral valve  regurgitation. No evidence of mitral stenosis.   7. The tricuspid valve is normal in structure. Tricuspid valve  regurgitation is mild.   8. The aortic valve is tricuspid Aortic valve regurgitation is trivial by  color flow Doppler. Mild aortic valve sclerosis without stenosis.   9. The pulmonic valve was normal in structure. Pulmonic valve  regurgitation is trivial by color flow Doppler.  10. Severely elevated pulmonary artery systolic pressure.  11. The inferior vena cava is normal in size with greater than 50%  respiratory variability, suggesting right atrial pressure of 3 mmHg.   EKG:  EKG is  ordered today.  The ekg ordered today demonstrates normal sinus rhythm, PAC  Recent Labs: 09/26/2021: ALT 70; Hemoglobin 13.5; Platelet Count 222 09/29/2021: BUN 31; Creatinine, Ser 1.58; Potassium 4.5; Sodium 138  Recent Lipid Panel    Component Value Date/Time   CHOL 132 12/28/2020 0925   TRIG 59.0 12/28/2020 0925   HDL 47.70 12/28/2020 0925   CHOLHDL 3 12/28/2020 0925   VLDL 11.8 12/28/2020 0925   LDLCALC 73 12/28/2020 0925   LDLDIRECT 80.1 04/15/2014 0953    Physical Exam:    VS:  BP 124/78   Pulse 67   Ht 5' 7.5" (1.715 m)   Wt 221 lb (100.2 kg)   SpO2 94%   BMI 34.10 kg/m     Wt Readings from Last 3 Encounters:  01/11/22 221 lb (100.2 kg)  11/03/21 219 lb (99.3 kg)  10/05/21 219 lb 3.2 oz (99.4 kg)     GEN:  Well nourished, well developed in no acute distress HEENT: Normal NECK: No JVD; No carotid bruits LYMPHATICS: No lymphadenopathy CARDIAC: RRR, no murmurs, rubs, gallops RESPIRATORY:  Clear to auscultation without rales, wheezing or rhonchi  ABDOMEN: Soft, non-tender, non-distended MUSCULOSKELETAL: 1+ bilateral lower  extremity edema; No  deformity  SKIN: Warm and dry NEUROLOGIC:  Alert and oriented x 3 PSYCHIATRIC:  Normal affect   ASSESSMENT AND PLAN:     Acute on chronic diastolic CHF Patient has chronic mild lower extremity edema in setting of bilateral DVT chronically, ?  More than usual on exam.  Reports compliance with low-sodium diet.   Encourage leg elevation and compression stocking.  Increase Lasix to twice daily for 2 days then daily to see response.  Has PCP follow-up next week.  2. HTN Blood pressure stable and controlled on current medication   3. HLD -12/28/2020: Cholesterol 132; HDL 47.70; LDL Cholesterol 73; Triglycerides 59.0; VLDL 11.8  -Followed by PCP -Continue statin   4.  CKD stage III -Followed by PCP and nephrology  5. 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  Concerns regarding medicines are outlined above.  Orders Placed This Encounter  Procedures   EKG 12-Lead   No orders of the defined types were placed in this encounter.   Patient Instructions  Medication Instructions:   INCREASE Lasix one (1) tablet by mouth ( 40 mg) twice daily X 2 days than one (1) tablet by mouth (40 mg) daily.   *If you need a refill on your cardiac medications before your next appointment, please call your pharmacy*   Lab Work:  None ordered  If you have labs (blood work) drawn today and your tests are completely normal, you will receive your results only by: MyChart Message (if you have MyChart) OR A paper copy in the mail If you have any lab test that is abnormal or we need to change your treatment, we will call you to review the results.   Testing/Procedures:  None ordered.   Follow-Up: At [facility], you and your health needs are our priority.  As part of our continuing mission to provide you with exceptional heart care, we have created designated Provider Care  Teams.  These Care Teams include your primary Cardiologist (physician) and Advanced Practice Providers (APPs -  Physician Assistants and Nurse Practitioners) who all work together to provide you with the care you need, when you need it.  We recommend signing up for the patient portal called "MyChart".  Sign up information is provided on this After Visit Summary.  MyChart is used to connect with patients for Virtual Visits (Telemedicine).  Patients are able to view lab/test results, encounter notes, upcoming appointments, etc.  Non-urgent messages can be sent to your provider as well.   To learn more about what you can do with MyChart, go to [email or web address].    Your next appointment:   1 year(s)  The format for your next appointment:   In Person  Provider:   Tonny Bollman, MD     Other Instructions  Your physician wants you to follow-up in: 1 year with Dr. Excell Seltzer.  You will receive a reminder letter in the mail two months in advance. If you don't receive a letter, please call our office to schedule the follow-up appointment.    Important Information About Sugar         Lorelei Pont, [place]  01/11/2022 8:53 AM    [facility]

## 2022-01-11 ENCOUNTER — Ambulatory Visit: Payer: Medicare PPO | Admitting: Physician Assistant

## 2022-01-11 ENCOUNTER — Encounter: Payer: Self-pay | Admitting: Physician Assistant

## 2022-01-11 VITALS — BP 124/78 | HR 67 | Ht 67.5 in | Wt 221.0 lb

## 2022-01-11 DIAGNOSIS — E782 Mixed hyperlipidemia: Secondary | ICD-10-CM

## 2022-01-11 DIAGNOSIS — I10 Essential (primary) hypertension: Secondary | ICD-10-CM

## 2022-01-11 DIAGNOSIS — I700 Atherosclerosis of aorta: Secondary | ICD-10-CM

## 2022-01-11 DIAGNOSIS — I5032 Chronic diastolic (congestive) heart failure: Secondary | ICD-10-CM

## 2022-01-11 DIAGNOSIS — I5033 Acute on chronic diastolic (congestive) heart failure: Secondary | ICD-10-CM | POA: Diagnosis not present

## 2022-01-11 NOTE — Patient Instructions (Signed)
Medication Instructions:   INCREASE Lasix one (1) tablet by mouth ( 40 mg) twice daily X 2 days than one (1) tablet by mouth (40 mg) daily.   *If you need a refill on your cardiac medications before your next appointment, please call your pharmacy*   Lab Work:  None ordered  If you have labs (blood work) drawn today and your tests are completely normal, you will receive your results only by: [place] (if you have MyChart) OR A paper copy in the mail If you have any lab test that is abnormal or we need to change your treatment, we will call you to review the results.   Testing/Procedures:  None ordered.   Follow-Up: At [facility], you and your health needs are our priority.  As part of our continuing mission to provide you with exceptional heart care, we have created designated Provider Care Teams.  These Care Teams include your primary Cardiologist (physician) and Advanced Practice Providers (APPs -  Physician Assistants and Nurse Practitioners) who all work together to provide you with the care you need, when you need it.  We recommend signing up for the patient portal called "MyChart".  Sign up information is provided on this After Visit Summary.  MyChart is used to connect with patients for Virtual Visits (Telemedicine).  Patients are able to view lab/test results, encounter notes, upcoming appointments, etc.  Non-urgent messages can be sent to your provider as well.   To learn more about what you can do with MyChart, go to [email or web address].    Your next appointment:   1 year(s)  The format for your next appointment:   In Person  Provider:   Sherren Mocha, MD     Other Instructions  Your physician wants you to follow-up in: 1 year with Dr. Burt Knack.  You will receive a reminder letter in the mail two months in advance. If you don't receive a letter, please call our office to schedule the follow-up appointment.    Important Information About  Sugar

## 2022-01-13 ENCOUNTER — Ambulatory Visit: Payer: Medicare PPO

## 2022-01-16 ENCOUNTER — Ambulatory Visit (INDEPENDENT_AMBULATORY_CARE_PROVIDER_SITE_OTHER): Payer: Medicare PPO

## 2022-01-16 VITALS — BP 122/60 | HR 60 | Temp 97.7°F | Resp 16 | Ht 67.5 in | Wt 212.6 lb

## 2022-01-16 DIAGNOSIS — Z0000 Encounter for general adult medical examination without abnormal findings: Secondary | ICD-10-CM

## 2022-01-16 NOTE — Progress Notes (Signed)
Subjective:   Noah Barnes is a 76 y.o. male who presents for Medicare Annual/Subsequent preventive examination.  Review of Systems     Cardiac Risk Factors include: advanced age (>66mn, >>15women);diabetes mellitus;dyslipidemia;family history of premature cardiovascular disease;hypertension;male gender;obesity (BMI >30kg/m2)     Objective:    Today's Vitals   01/16/22 1338  BP: 122/60  Pulse: 60  Resp: 16  Temp: 97.7 F (36.5 C)  SpO2: 95%  Weight: 212 lb 9.6 oz (96.4 kg)  Height: 5' 7.5" (1.715 m)  PainSc: 0-No pain   Body mass index is 32.81 kg/m.     01/16/2022    2:07 PM 09/26/2021    1:18 PM 03/25/2021   11:45 AM 12/29/2020    2:51 PM 03/25/2020   10:58 AM 09/29/2019   12:22 PM 09/23/2019   10:39 AM  Advanced Directives  Does Patient Have a Medical Advance Directive? No No Yes No No No No  Type of AComptrollerLiving will      Copy of [place] Chart?   No - copy requested      Would patient like information on creating a medical advance directive? No - Patient declined No - Patient declined  No - Patient declined No - Patient declined No - Patient declined No - Patient declined    Current Medications (verified) Outpatient Encounter Medications as of 01/16/2022  Medication Sig   Accu-Chek Softclix Lancets lancets CHECK BLOOD SUGAR TWICE DAILY AS DIRECTED   albuterol (PROVENTIL) (2.5 MG/3ML) 0.083% nebulizer solution Take 3 mLs (2.5 mg total) by nebulization every 6 (six) hours as needed for wheezing or shortness of breath.   albuterol (VENTOLIN HFA) 108 (90 Base) MCG/ACT inhaler Inhale 2 puffs into the lungs every 6 (six) hours as needed for wheezing or shortness of breath.   allopurinol (ZYLOPRIM) 300 MG tablet Take 1 tablet by mouth once daily   apixaban (ELIQUIS) 2.5 MG TABS tablet Take 1 tablet (2.5 mg total) by mouth 2 (two) times daily.   atorvastatin (LIPITOR) 20 MG tablet Take 1 tablet by mouth  once daily   Blood Glucose Monitoring Suppl (ACCU-CHEK GUIDE ME) w/Device KIT 1 each by Does not apply route in the morning and at bedtime. Use Accu Chek Guide me device to check blood sugar twice daily.   FERREX 150 150 MG capsule Take 1 capsule by mouth once daily   Fluticasone-Salmeterol (ADVAIR) 100-50 MCG/DOSE AEPB Inhale 1 puff into the lungs 2 (two) times daily as needed (chest tightness).   furosemide (LASIX) 40 MG tablet Take 40 mg by mouth daily.   glimepiride (AMARYL) 2 MG tablet TAKE 1 TABLET BY MOUTH ONCE DAILY BEFORE SUPPER   glucose blood (ACCU-CHEK GUIDE) test strip USE AS DIRECTED TO TEST BLOOD SUGAR TWICE DAILY   hydrALAZINE (APRESOLINE) 50 MG tablet Take 1 tablet (50 mg total) by mouth 2 (two) times daily. Follow-up appt due in July must see provider for future refills   LINZESS 290 MCG CAPS capsule TAKE 1 CAPSULE BY MOUTH ONCE DAILY BEFORE BREAKFAST   metoprolol tartrate (LOPRESSOR) 100 MG tablet Take 1 tablet by mouth twice daily   Semaglutide (RYBELSUS) 14 MG TABS TAKE 1 TABLET BY MOUTH IN THE MORNING 30 MINUTES BEFORE BREAKFAST WITH A SIP OF WATER   sildenafil (VIAGRA) 100 MG tablet TAKE 1 TABLET BY MOUTH EVERY DAY AS NEEDED FOR ERECTILE DYSFUNCTION   tamsulosin (FLOMAX) 0.4 MG CAPS capsule TAKE 1 CAPSULE  BY MOUTH ONCE DAILY AFTER SUPPER   Tiotropium Bromide Monohydrate (SPIRIVA RESPIMAT) 2.5 MCG/ACT AERS Inhale 2 puffs into the lungs daily.   No facility-administered encounter medications on file as of 01/16/2022.    Allergies (verified) Ace inhibitors and Angiotensin receptor blockers   History: Past Medical History:  Diagnosis Date   CHF (congestive heart failure) (HCC)    Chronic diastolic heart failure (HCC)    Constipation    Diabetes mellitus    Diverticulitis    Diverticulosis    ED (erectile dysfunction)    Gout    Heart murmur    Hyperlipidemia    Hypertension    IBS (irritable bowel syndrome)    Renal calculus    Renal insufficiency    Tubular  adenoma of colon    Past Surgical History:  Procedure Laterality Date   COLONOSCOPY  1998   negative; no F/U   EXTRACORPOREAL SHOCK WAVE LITHOTRIPSY Left 03/20/2019   Procedure: EXTRACORPOREAL SHOCK WAVE LITHOTRIPSY (ESWL);  Surgeon: Cleon Gustin, MD;  Location: [facility];  Service: Urology;  Laterality: Left;   knee effusion tapped     Family History  Problem Relation Age of Onset   Heart attack Mother 21   Hypertension Mother    Prostate cancer Father 18   Diabetes Father    Irritable bowel syndrome Father    Irritable bowel syndrome Sister        x 2   Colon cancer Brother        dx in his late 40's   Heart disease Brother    Irritable bowel syndrome Brother    Stroke Neg Hx    Esophageal cancer Neg Hx    Rectal cancer Neg Hx    Stomach cancer Neg Hx    Social History   Socioeconomic History   Marital status: Married    Spouse name: Not on file   Number of children: 1   Years of education: Not on file   Highest education level: Not on file  Occupational History   Occupation: Metallurgist: [employer]    Comment: [phone]  Tobacco Use   Smoking status: Former    Packs/day: 0.30    Years: 23.00    Total pack years: 6.90    Types: Cigarettes    Quit date: 07/04/1991    Years since quitting: 30.5   Smokeless tobacco: Never   Tobacco comments:    smoked age [age], up to 1/3 ppd; may be less  Vaping Use   Vaping Use: Never used  Substance and Sexual Activity   Alcohol use: Yes    Alcohol/week: 2.0 standard drinks of alcohol    Types: 2 Glasses of wine per week    Comment: rare   Drug use: No   Sexual activity: Yes  Other Topics Concern   Not on file  Social History Narrative   Not on file   Social Determinants of Health   Financial Resource Strain: Low Risk  (01/16/2022)   Overall Financial Resource Strain (CARDIA)    Difficulty of Paying Living Expenses: Not hard at all  Food Insecurity: No Food Insecurity  (01/16/2022)   Hunger Vital Sign    Worried About Running Out of Food in the Last Year: Never true    Ran Out of Food in the Last Year: Never true  Transportation Needs: No Transportation Needs (01/16/2022)   PRAPARE - Hydrologist (Medical):  No    Lack of Transportation (Non-Medical): No  Physical Activity: Sufficiently Active (01/16/2022)   Exercise Vital Sign    Days of Exercise per Week: 5 days    Minutes of Exercise per Session: 30 min  Stress: No Stress Concern Present (01/16/2022)   [place]    Feeling of Stress : Not at all  Social Connections: [facility] (01/16/2022)   Social Connection and Isolation Panel [NHANES]    Frequency of Communication with Friends and Family: More than three times a week    Frequency of Social Gatherings with Friends and Family: More than three times a week    Attends Religious Services: More than 4 times per year    Active Member of [employer] or Organizations: Yes    Attends Music therapist: More than 4 times per year    Marital Status: Married    Tobacco Counseling Counseling given: Not Answered Tobacco comments: smoked age [age], up to 1/3 ppd; may be less   Clinical Intake:  Pre-visit preparation completed: Yes  Pain : No/denies pain Pain Score: 0-No pain     BMI - recorded: 32.81 Nutritional Status: BMI > 30  Obese Nutritional Risks: None Diabetes: Yes CBG done?: No Did pt. bring in CBG monitor from home?: No  How often do you need to have someone help you when you read instructions, pamphlets, or other written materials from your doctor or pharmacy?: 1 - Never What is the last grade level you completed in school?: Bachelor's Degree from [employer]  Diabetic? yes  Interpreter Needed?: No  Information entered by :: Lisette Abu, LPN.   Activities of Daily Living    01/16/2022    2:08 PM  In your present state  of health, do you have any difficulty performing the following activities:  Hearing? 0  Vision? 0  Difficulty concentrating or making decisions? 0  Walking or climbing stairs? 0  Dressing or bathing? 0  Doing errands, shopping? 0  Preparing Food and eating ? N  Using the Toilet? N  In the past six months, have you accidently leaked urine? N  Do you have problems with loss of bowel control? N  Managing your Medications? N  Managing your Finances? N  Housekeeping or managing your Housekeeping? N    Patient Care Team: Binnie Rail, MD as PCP - General (Internal Medicine) Sherren Mocha, MD as PCP - Cardiology (Cardiology) Elayne Snare, MD as Consulting Physician (Endocrinology) Joseph Art, OD as Consulting Physician (Optometry)  Indicate any recent Medical Services you may have received from other than [facility] providers in the past year (date may be approximate).     Assessment:   This is a routine wellness examination for [name].  Hearing/Vision screen Hearing Screening - Comments:: Patient denied any hearing difficulty.   No hearing aids.  Vision Screening - Comments:: Patient does wear corrective lenses/contacts.  Eye exam done by: [facility]    Dietary issues and exercise activities discussed: Current Exercise Habits: Structured exercise class;Home exercise routine, Type of exercise: walking;treadmill;stretching;strength training/weights, Time (Minutes): 30, Frequency (Times/Week): 5, Weekly Exercise (Minutes/Week): 150, Intensity: Moderate   Goals Addressed             This Visit's Progress    Not to get bronchitis.        Depression Screen    01/16/2022    2:06 PM 12/30/2020    9:55 AM 12/29/2020    3:10 PM  10/20/2020    1:39 PM 09/29/2019   12:22 PM 01/06/2019    3:04 PM 08/13/2018   10:30 AM  PHQ 2/9 Scores  PHQ - 2 Score 0 0 0 0 0 0 0    Fall Risk    01/16/2022    2:08 PM 12/30/2020    9:55 AM 12/29/2020    3:13 PM 10/20/2020     1:35 PM 09/29/2019   12:22 PM  [place] in the past year? 0 0 0 0 0  Number falls in past yr: 0 0 0 0 0  Injury with Fall? 0 0 0 0 0  Risk for fall due to : No Fall Risks  No Fall Risks No Fall Risks No Fall Risks  Follow up Falls evaluation completed  Falls evaluation completed Falls evaluation completed Falls evaluation completed;Falls prevention discussed;Education provided    FALL RISK PREVENTION PERTAINING TO THE HOME:  Any stairs in or around the home? Yes  If so, are there any without handrails? No  Home free of loose throw rugs in walkways, pet beds, electrical cords, etc? Yes  Adequate lighting in your home to reduce risk of falls? Yes   ASSISTIVE DEVICES UTILIZED TO PREVENT FALLS:  Life alert? No  Use of a cane, walker or w/c? No  Grab bars in the bathroom? No  Shower chair or bench in shower? No  Elevated toilet seat or a handicapped toilet? No   TIMED UP AND GO:  Was the test performed? Yes .  Length of time to ambulate 10 feet: 6 sec.   Gait steady and fast without use of assistive device  Cognitive Function:    08/08/2017   10:24 AM 12/15/2014    9:19 AM  MMSE - Mini Mental State Exam  Not completed:  Unable to complete  Orientation to time 5   Orientation to Place 5   Registration 3   Attention/ Calculation 5   Recall 3   Language- name 2 objects 2   Language- repeat 1   Language- follow 3 step command 3   Language- read & follow direction 1   Write a sentence 1   Copy design 1   Total score 30         01/16/2022    2:09 PM 09/29/2019   12:22 PM  6CIT Screen  What Year? 0 points 0 points  What month? 0 points 0 points  What time? 0 points 0 points  Count back from 20 0 points 0 points  Months in reverse 0 points 0 points  Repeat phrase 0 points 0 points  Total Score 0 points 0 points    Immunizations Immunization History  Administered Date(s) Administered   Fluad Quad(high Dose 65+) 03/06/2019, 05/05/2020, 03/26/2021    Influenza, High Dose Seasonal PF 07/12/2016, 04/10/2017, 03/22/2018   PFIZER Comirnaty(Gray Top)Covid-19 Tri-Sucrose Vaccine 01/14/2021   PFIZER(Purple Top)SARS-COV-2 Vaccination 08/13/2019, 09/10/2019, 04/06/2020   Pfizer Covid-19 Vaccine Bivalent Booster 27yr & up 06/08/2021   Pneumococcal Conjugate-13 01/06/2019   Pneumococcal Polysaccharide-23 03/01/2020    TDAP status: Due, Education has been provided regarding the importance of this vaccine. Advised may receive this vaccine at local pharmacy or Health Dept. Aware to provide a copy of the vaccination record if obtained from local pharmacy or Health Dept. Verbalized acceptance and understanding.  Flu Vaccine status: Up to date  Pneumococcal vaccine status: Up to date  Covid-19 vaccine status: Completed vaccines  Qualifies for Shingles Vaccine? Yes  Zostavax completed No   Shingrix Completed?: No.    Education has been provided regarding the importance of this vaccine. Patient has been advised to call insurance company to determine out of pocket expense if they have not yet received this vaccine. Advised may also receive vaccine at local pharmacy or Health Dept. Verbalized acceptance and understanding.  Screening Tests Health Maintenance  Topic Date Due   TETANUS/TDAP  Never done   Zoster Vaccines- Shingrix (1 of 2) Never done   COVID-19 Vaccine (6 - Pfizer series) 10/07/2021   OPHTHALMOLOGY EXAM  01/18/2022   INFLUENZA VACCINE  01/31/2022   HEMOGLOBIN A1C  04/01/2022   Diabetic kidney evaluation - GFR measurement  09/30/2022   Diabetic kidney evaluation - Urine ACR  09/30/2022   FOOT EXAM  10/06/2022   COLONOSCOPY (Pts 45-54yr Insurance coverage will need to be confirmed)  02/22/2025   Pneumonia Vaccine 76 Years old  Completed   Hepatitis C Screening  Completed   HPV VACCINES  Aged Out    Health Maintenance  Health Maintenance Due  Topic Date Due   TETANUS/TDAP  Never done   Zoster Vaccines- Shingrix (1 of 2) Never  done   COVID-19 Vaccine (6 - Pfizer series) 10/07/2021    Colorectal cancer screening: Type of screening: Colonoscopy. Completed 02/23/2020. Repeat every 5 years  Lung Cancer Screening: (Low Dose CT Chest recommended if Age 75-80years, 30 pack-year currently smoking OR have quit w/in 15years.) does not qualify.   Lung Cancer Screening Referral: no  Additional Screening:  Hepatitis C Screening: does qualify; Completed 01/10/2016  Vision Screening: Recommended annual ophthalmology exams for early detection of glaucoma and other disorders of the eye. Is the patient up to date with their annual eye exam?  Yes  Who is the provider or what is the name of the office in which the patient attends annual eye exams? Ritesh Poudyal, OD. If pt is not established with a provider, would they like to be referred to a provider to establish care? No .   Dental Screening: Recommended annual dental exams for proper oral hygiene  Community Resource Referral / Chronic Care Management: CRR required this visit?  No   CCM required this visit?  No      Plan:     I have personally reviewed and noted the following in the patient's chart:   Medical and social history Use of alcohol, tobacco or illicit drugs  Current medications and supplements including opioid prescriptions. Patient is not currently taking opioid prescriptions. Functional ability and status Nutritional status Physical activity Advanced directives List of other physicians Hospitalizations, surgeries, and ER visits in previous 12 months Vitals Screenings to include cognitive, depression, and falls Referrals and appointments  In addition, I have reviewed and discussed with patient certain preventive protocols, quality metrics, and best practice recommendations. A written personalized care plan for preventive services as well as general preventive health recommendations were provided to patient.     SSheral Flow  LPN   [id number]  Nurse Notes:  Hearing Screening - Comments:: Patient denied any hearing difficulty.   No hearing aids.  Vision Screening - Comments:: Patient does wear corrective lenses/contacts.  Eye exam done by: [facility]

## 2022-01-16 NOTE — Progress Notes (Signed)
Subjective:    Patient ID: Noah Barnes, male    DOB: September 25, 1945, 76 y.o.   MRN: [id number]     HPI [place] is here for a physical exam.      Medications and allergies reviewed with patient and updated if appropriate.  Current Outpatient Medications on File Prior to Visit  Medication Sig Dispense Refill   Accu-Chek Softclix Lancets lancets CHECK BLOOD SUGAR TWICE DAILY AS DIRECTED 100 each 2   albuterol (PROVENTIL) (2.5 MG/3ML) 0.083% nebulizer solution Take 3 mLs (2.5 mg total) by nebulization every 6 (six) hours as needed for wheezing or shortness of breath. 75 mL 5   albuterol (VENTOLIN HFA) 108 (90 Base) MCG/ACT inhaler Inhale 2 puffs into the lungs every 6 (six) hours as needed for wheezing or shortness of breath. 8 g 0   allopurinol (ZYLOPRIM) 300 MG tablet Take 1 tablet by mouth once daily 90 tablet 0   apixaban (ELIQUIS) 2.5 MG TABS tablet Take 1 tablet (2.5 mg total) by mouth 2 (two) times daily. 180 tablet 2   atorvastatin (LIPITOR) 20 MG tablet Take 1 tablet by mouth once daily 90 tablet 0   Blood Glucose Monitoring Suppl (ACCU-CHEK GUIDE ME) w/Device KIT 1 each by Does not apply route in the morning and at bedtime. Use Accu Chek Guide me device to check blood sugar twice daily. 1 kit 0   FERREX 150 150 MG capsule Take 1 capsule by mouth once daily 90 capsule 0   Fluticasone-Salmeterol (ADVAIR) 100-50 MCG/DOSE AEPB Inhale 1 puff into the lungs 2 (two) times daily as needed (chest tightness). 1 each 3   furosemide (LASIX) 40 MG tablet Take 40 mg by mouth daily.     glimepiride (AMARYL) 2 MG tablet TAKE 1 TABLET BY MOUTH ONCE DAILY BEFORE SUPPER 90 tablet 1   glucose blood (ACCU-CHEK GUIDE) test strip USE AS DIRECTED TO TEST BLOOD SUGAR TWICE DAILY 100 strip 3   hydrALAZINE (APRESOLINE) 50 MG tablet Take 1 tablet (50 mg total) by mouth 2 (two) times daily. Follow-up appt due in July must see provider for future refills 180 tablet 0   LINZESS 290 MCG CAPS capsule TAKE 1  CAPSULE BY MOUTH ONCE DAILY BEFORE BREAKFAST 90 capsule 0   metoprolol tartrate (LOPRESSOR) 100 MG tablet Take 1 tablet by mouth twice daily 180 tablet 0   Semaglutide (RYBELSUS) 14 MG TABS TAKE 1 TABLET BY MOUTH IN THE MORNING 30 MINUTES BEFORE BREAKFAST WITH A SIP OF WATER 30 tablet 3   sildenafil (VIAGRA) 100 MG tablet TAKE 1 TABLET BY MOUTH EVERY DAY AS NEEDED FOR ERECTILE DYSFUNCTION 6 tablet 1   tamsulosin (FLOMAX) 0.4 MG CAPS capsule TAKE 1 CAPSULE BY MOUTH ONCE DAILY AFTER SUPPER 90 capsule 0   Tiotropium Bromide Monohydrate (SPIRIVA RESPIMAT) 2.5 MCG/ACT AERS Inhale 2 puffs into the lungs daily. 4 g 2   No current facility-administered medications on file prior to visit.    Review of Systems     Objective:  There were no vitals filed for this visit. There were no vitals filed for this visit. There is no height or weight on file to calculate BMI.  BP Readings from Last 3 Encounters:  01/16/22 122/60  01/11/22 124/78  11/03/21 122/76    Wt Readings from Last 3 Encounters:  01/16/22 212 lb 9.6 oz (96.4 kg)  01/11/22 221 lb (100.2 kg)  11/03/21 219 lb (99.3 kg)      Physical Exam Constitutional: He appears well-developed  and well-nourished. No distress.  HENT:  Head: Normocephalic and atraumatic.  Right Ear: External ear normal.  Left Ear: External ear normal.  Mouth/Throat: Oropharynx is clear and moist.  Normal ear canals and TM b/l  Eyes: Conjunctivae and EOM are normal.  Neck: Neck supple. No tracheal deviation present. No thyromegaly present.  No carotid bruit  Cardiovascular: Normal rate, regular rhythm, normal heart sounds and intact distal pulses.   No murmur heard. Pulmonary/Chest: Effort normal and breath sounds normal. No respiratory distress. He has no wheezes. He has no rales.  Abdominal: Soft. He exhibits no distension. There is no tenderness.  Genitourinary: deferred  Musculoskeletal: He exhibits no edema.  Lymphadenopathy:   He has no cervical  adenopathy.  Skin: Skin is warm and dry. He is not diaphoretic.  Psychiatric: He has a normal mood and affect. His behavior is normal.         Assessment & Plan:   Physical exam: Screening blood work  ordered Exercise    Weight   Substance abuse   none   Reviewed recommended immunizations.   Health Maintenance  Topic Date Due   TETANUS/TDAP  Never done   Zoster Vaccines- Shingrix (1 of 2) Never done   COVID-19 Vaccine (6 - Pfizer series) 10/07/2021   OPHTHALMOLOGY EXAM  01/18/2022   INFLUENZA VACCINE  01/31/2022   HEMOGLOBIN A1C  04/01/2022   Diabetic kidney evaluation - GFR measurement  09/30/2022   Diabetic kidney evaluation - Urine ACR  09/30/2022   FOOT EXAM  10/06/2022   COLONOSCOPY (Pts 45-73yr Insurance coverage will need to be confirmed)  02/22/2025   Pneumonia Vaccine 76 Years old  Completed   Hepatitis C Screening  Completed   HPV VACCINES  Aged Out     See Problem List for Assessment and Plan of chronic medical problems.  This encounter was created in error - please disregard.

## 2022-01-16 NOTE — Patient Instructions (Signed)
Noah Barnes , Thank you for taking time to come for your Medicare Wellness Visit. I appreciate your ongoing commitment to your health goals. Please review the following plan we discussed and let me know if I can assist you in the future.   Screening recommendations/referrals: Colonoscopy: 02/23/2020; due every 5 years Recommended yearly ophthalmology/optometry visit for glaucoma screening and checkup Recommended yearly dental visit for hygiene and checkup  Vaccinations: Influenza vaccine: 03/26/2021 Pneumococcal vaccine: 01/06/2019, 03/01/2020 Tdap vaccine: due; recommend every 10 years Shingles vaccine: due; recommend Shingrix which is 2 doses 2-6 months apart and over 90% effective     Covid-19: 08/13/2019, 09/10/2019, 04/06/2020, 01/14/2021, 06/08/2021  Advanced directives: No  Conditions/risks identified: Yes  Next appointment: Please schedule your next Medicare Wellness Visit with your Nurse Health Advisor in 1 year by calling [phone].  Preventive Care 80 Years and Older, Male Preventive care refers to lifestyle choices and visits with your health care provider that can promote health and wellness. What does preventive care include? A yearly physical exam. This is also called an annual well check. Dental exams once or twice a year. Routine eye exams. Ask your health care provider how often you should have your eyes checked. Personal lifestyle choices, including: Daily care of your teeth and gums. Regular physical activity. Eating a healthy diet. Avoiding tobacco and drug use. Limiting alcohol use. Practicing safe sex. Taking low doses of aspirin every day. Taking vitamin and mineral supplements as recommended by your health care provider. What happens during an annual well check? The services and screenings done by your health care provider during your annual well check will depend on your age, overall health, lifestyle risk factors, and family history of disease. Counseling  Your  health care provider may ask you questions about your: Alcohol use. Tobacco use. Drug use. Emotional well-being. Home and relationship well-being. Sexual activity. Eating habits. History of falls. Memory and ability to understand (cognition). Work and work Statistician. Screening  You may have the following tests or measurements: Height, weight, and BMI. Blood pressure. Lipid and cholesterol levels. These may be checked every 5 years, or more frequently if you are over 47 years old. Skin check. Lung cancer screening. You may have this screening every year starting at age 55 if you have a 30-pack-year history of smoking and currently smoke or have quit within the past 15 years. Fecal occult blood test (FOBT) of the stool. You may have this test every year starting at age 22. Flexible sigmoidoscopy or colonoscopy. You may have a sigmoidoscopy every 5 years or a colonoscopy every 10 years starting at age 28. Prostate cancer screening. Recommendations will vary depending on your family history and other risks. Hepatitis C blood test. Hepatitis B blood test. Sexually transmitted disease (STD) testing. Diabetes screening. This is done by checking your blood sugar (glucose) after you have not eaten for a while (fasting). You may have this done every 1-3 years. Abdominal aortic aneurysm (AAA) screening. You may need this if you are a current or former smoker. Osteoporosis. You may be screened starting at age 6 if you are at high risk. Talk with your health care provider about your test results, treatment options, and if necessary, the need for more tests. Vaccines  Your health care provider may recommend certain vaccines, such as: Influenza vaccine. This is recommended every year. Tetanus, diphtheria, and acellular pertussis (Tdap, Td) vaccine. You may need a Td booster every 10 years. Zoster vaccine. You may need this after age 81.  Pneumococcal 13-valent conjugate (PCV13) vaccine. One dose  is recommended after age 71. Pneumococcal polysaccharide (PPSV23) vaccine. One dose is recommended after age 41. Talk to your health care provider about which screenings and vaccines you need and how often you need them. This information is not intended to replace advice given to you by your health care provider. Make sure you discuss any questions you have with your health care provider. Document Released: 07/16/2015 Document Revised: 03/08/2016 Document Reviewed: 04/20/2015 Elsevier Interactive Patient Education  2017 [employer] Prevention in the Home Falls can cause injuries. They can happen to people of all ages. There are many things you can do to make your home safe and to help prevent falls. What can I do on the outside of my home? Regularly fix the edges of walkways and driveways and fix any cracks. Remove anything that might make you trip as you walk through a door, such as a raised step or threshold. Trim any bushes or trees on the path to your home. Use bright outdoor lighting. Clear any walking paths of anything that might make someone trip, such as rocks or tools. Regularly check to see if handrails are loose or broken. Make sure that both sides of any steps have handrails. Any raised decks and porches should have guardrails on the edges. Have any leaves, snow, or ice cleared regularly. Use sand or salt on walking paths during winter. Clean up any spills in your garage right away. This includes oil or grease spills. What can I do in the bathroom? Use night lights. Install grab bars by the toilet and in the tub and shower. Do not use towel bars as grab bars. Use non-skid mats or decals in the tub or shower. If you need to sit down in the shower, use a plastic, non-slip stool. Keep the floor dry. Clean up any water that spills on the floor as soon as it happens. Remove soap buildup in the tub or shower regularly. Attach bath mats securely with double-sided non-slip rug  tape. Do not have throw rugs and other things on the floor that can make you trip. What can I do in the bedroom? Use night lights. Make sure that you have a light by your bed that is easy to reach. Do not use any sheets or blankets that are too big for your bed. They should not hang down onto the floor. Have a firm chair that has side arms. You can use this for support while you get dressed. Do not have throw rugs and other things on the floor that can make you trip. What can I do in the kitchen? Clean up any spills right away. Avoid walking on wet floors. Keep items that you use a lot in easy-to-reach places. If you need to reach something above you, use a strong step stool that has a grab bar. Keep electrical cords out of the way. Do not use floor polish or wax that makes floors slippery. If you must use wax, use non-skid floor wax. Do not have throw rugs and other things on the floor that can make you trip. What can I do with my stairs? Do not leave any items on the stairs. Make sure that there are handrails on both sides of the stairs and use them. Fix handrails that are broken or loose. Make sure that handrails are as long as the stairways. Check any carpeting to make sure that it is firmly attached to the stairs. Fix any  carpet that is loose or worn. Avoid having throw rugs at the top or bottom of the stairs. If you do have throw rugs, attach them to the floor with carpet tape. Make sure that you have a light switch at the top of the stairs and the bottom of the stairs. If you do not have them, ask someone to add them for you. What else can I do to help prevent falls? Wear shoes that: Do not have high heels. Have rubber bottoms. Are comfortable and fit you well. Are closed at the toe. Do not wear sandals. If you use a stepladder: Make sure that it is fully opened. Do not climb a closed stepladder. Make sure that both sides of the stepladder are locked into place. Ask someone to  hold it for you, if possible. Clearly mark and make sure that you can see: Any grab bars or handrails. First and last steps. Where the edge of each step is. Use tools that help you move around (mobility aids) if they are needed. These include: Canes. Walkers. Scooters. Crutches. Turn on the lights when you go into a dark area. Replace any light bulbs as soon as they burn out. Set up your furniture so you have a clear path. Avoid moving your furniture around. If any of your floors are uneven, fix them. If there are any pets around you, be aware of where they are. Review your medicines with your doctor. Some medicines can make you feel dizzy. This can increase your chance of falling. Ask your doctor what other things that you can do to help prevent falls. This information is not intended to replace advice given to you by your health care provider. Make sure you discuss any questions you have with your health care provider. Document Released: 04/15/2009 Document Revised: 11/25/2015 Document Reviewed: 07/24/2014 Elsevier Interactive Patient Education  2017 [employer].

## 2022-01-17 ENCOUNTER — Encounter: Payer: Self-pay | Admitting: Internal Medicine

## 2022-01-17 ENCOUNTER — Encounter: Payer: Medicare PPO | Admitting: Internal Medicine

## 2022-01-17 DIAGNOSIS — N401 Enlarged prostate with lower urinary tract symptoms: Secondary | ICD-10-CM

## 2022-01-17 DIAGNOSIS — N1831 Chronic kidney disease, stage 3a: Secondary | ICD-10-CM

## 2022-01-17 DIAGNOSIS — Z0000 Encounter for general adult medical examination without abnormal findings: Secondary | ICD-10-CM

## 2022-01-17 DIAGNOSIS — E1129 Type 2 diabetes mellitus with other diabetic kidney complication: Secondary | ICD-10-CM

## 2022-01-17 DIAGNOSIS — I5032 Chronic diastolic (congestive) heart failure: Secondary | ICD-10-CM

## 2022-01-17 DIAGNOSIS — I10 Essential (primary) hypertension: Secondary | ICD-10-CM

## 2022-01-17 DIAGNOSIS — E782 Mixed hyperlipidemia: Secondary | ICD-10-CM

## 2022-01-17 DIAGNOSIS — M109 Gout, unspecified: Secondary | ICD-10-CM

## 2022-01-17 NOTE — Assessment & Plan Note (Signed)
Chronic Continue atorvastatin 20 mg daily Encouraged regular exercise and healthy diet

## 2022-01-17 NOTE — Patient Instructions (Addendum)
Blood work was ordered.     Medications changes include :      Your prescription(s) have been sent to your pharmacy.    A referral was ordered for XX.     Someone from that office will call you to schedule an appointment.    Return in about 6 months (around 07/20/2022) for follow up.   Health Maintenance, Male Adopting a healthy lifestyle and getting preventive care are important in promoting health and wellness. Ask your health care provider about: The right schedule for you to have regular tests and exams. Things you can do on your own to prevent diseases and keep yourself healthy. What should I know about diet, weight, and exercise? Eat a healthy diet  Eat a diet that includes plenty of vegetables, fruits, low-fat dairy products, and lean protein. Do not eat a lot of foods that are high in solid fats, added sugars, or sodium. Maintain a healthy weight Body mass index (BMI) is a measurement that can be used to identify possible weight problems. It estimates body fat based on height and weight. Your health care provider can help determine your BMI and help you achieve or maintain a healthy weight. Get regular exercise Get regular exercise. This is one of the most important things you can do for your health. Most adults should: Exercise for at least 150 minutes each week. The exercise should increase your heart rate and make you sweat (moderate-intensity exercise). Do strengthening exercises at least twice a week. This is in addition to the moderate-intensity exercise. Spend less time sitting. Even light physical activity can be beneficial. Watch cholesterol and blood lipids Have your blood tested for lipids and cholesterol at 75 years of age, then have this test every 5 years. You may need to have your cholesterol levels checked more often if: Your lipid or cholesterol levels are high. You are older than 75 years of age. You are at high risk for heart disease. What should  I know about cancer screening? Many types of cancers can be detected early and may often be prevented. Depending on your health history and family history, you may need to have cancer screening at various ages. This may include screening for: Colorectal cancer. Prostate cancer. Skin cancer. Lung cancer. What should I know about heart disease, diabetes, and high blood pressure? Blood pressure and heart disease High blood pressure causes heart disease and increases the risk of stroke. This is more likely to develop in people who have high blood pressure readings or are overweight. Talk with your health care provider about your target blood pressure readings. Have your blood pressure checked: Every 3-5 years if you are 61-57 years of age. Every year if you are 45 years old or older. If you are between the ages of 75 and 62 and are a current or former smoker, ask your health care provider if you should have a one-time screening for abdominal aortic aneurysm (AAA). Diabetes Have regular diabetes screenings. This checks your fasting blood sugar level. Have the screening done: Once every three years after age 47 if you are at a normal weight and have a low risk for diabetes. More often and at a younger age if you are overweight or have a high risk for diabetes. What should I know about preventing infection? Hepatitis B If you have a higher risk for hepatitis B, you should be screened for this virus. Talk with your health care provider to find out if you  are at risk for hepatitis B infection. Hepatitis C Blood testing is recommended for: Everyone born from [birth year] through 1964. Anyone with known risk factors for hepatitis C. Sexually transmitted infections (STIs) You should be screened each year for STIs, including gonorrhea and chlamydia, if: You are sexually active and are younger than 75 years of age. You are older than 75 years of age and your health care provider tells you that you are at risk  for this type of infection. Your sexual activity has changed since you were last screened, and you are at increased risk for chlamydia or gonorrhea. Ask your health care provider if you are at risk. Ask your health care provider about whether you are at high risk for HIV. Your health care provider may recommend a prescription medicine to help prevent HIV infection. If you choose to take medicine to prevent HIV, you should first get tested for HIV. You should then be tested every 3 months for as long as you are taking the medicine. Follow these instructions at home: Alcohol use Do not drink alcohol if your health care provider tells you not to drink. If you drink alcohol: Limit how much you have to 0-2 drinks a day. Know how much alcohol is in your drink. In the [place] one drink equals one 12 oz bottle of beer (355 mL), one 5 oz glass of wine (148 mL), or one 1 oz glass of hard liquor (44 mL). Lifestyle Do not use any products that contain nicotine or tobacco. These products include cigarettes, chewing tobacco, and vaping devices, such as e-cigarettes. If you need help quitting, ask your health care provider. Do not use street drugs. Do not share needles. Ask your health care provider for help if you need support or information about quitting drugs. General instructions Schedule regular health, dental, and eye exams. Stay current with your vaccines. Tell your health care provider if: You often feel depressed. You have ever been abused or do not feel safe at home. Summary Adopting a healthy lifestyle and getting preventive care are important in promoting health and wellness. Follow your health care provider's instructions about healthy diet, exercising, and getting tested or screened for diseases. Follow your health care provider's instructions on monitoring your cholesterol and blood pressure. This information is not intended to replace advice given to you by your health care provider. Make  sure you discuss any questions you have with your health care provider. Document Revised: 11/08/2020 Document Reviewed: 11/08/2020 Elsevier Patient Education  [employer].

## 2022-01-17 NOTE — Assessment & Plan Note (Signed)
Chronic °Regular exercise and healthy diet encouraged °Check lipid panel  °Continue atorvastatin 20 mg daily °

## 2022-01-17 NOTE — Assessment & Plan Note (Signed)
Chronic Following with nephrology Kidney function stable CMP, CBC

## 2022-01-17 NOTE — Assessment & Plan Note (Signed)
Chronic Following with cardiology Euvolemic On Lasix 40 mg daily, metoprolol 100 mg twice daily Monitor his weight at home

## 2022-01-17 NOTE — Assessment & Plan Note (Signed)
Chronic Denies flares of gout in the past several months Continue allopurinol 300 mg daily Check uric acid level

## 2022-01-17 NOTE — Assessment & Plan Note (Signed)
Chronic Blood pressure well controlled CMP Continue metoprolol 100 mg twice daily, hydralazine 50 mg twice daily

## 2022-01-17 NOTE — Assessment & Plan Note (Signed)
Chronic Controlled, Stable Continue Flomax 0.4 mg daily 

## 2022-01-17 NOTE — Assessment & Plan Note (Signed)
Chronic  Lab Results  Component Value Date   HGBA1C 5.9 09/29/2021   Sugars well controlled Management per Dr. Dwyane Dee Continue Rybelsus 14 mg daily, glimepiride 2 mg daily Stressed regular exercise, diabetic diet

## 2022-01-18 ENCOUNTER — Other Ambulatory Visit: Payer: Self-pay | Admitting: Internal Medicine

## 2022-01-24 DIAGNOSIS — H524 Presbyopia: Secondary | ICD-10-CM | POA: Diagnosis not present

## 2022-01-24 DIAGNOSIS — H35033 Hypertensive retinopathy, bilateral: Secondary | ICD-10-CM | POA: Diagnosis not present

## 2022-01-24 DIAGNOSIS — H2513 Age-related nuclear cataract, bilateral: Secondary | ICD-10-CM | POA: Diagnosis not present

## 2022-01-24 DIAGNOSIS — E119 Type 2 diabetes mellitus without complications: Secondary | ICD-10-CM | POA: Diagnosis not present

## 2022-01-24 DIAGNOSIS — H5203 Hypermetropia, bilateral: Secondary | ICD-10-CM | POA: Diagnosis not present

## 2022-01-24 DIAGNOSIS — H52223 Regular astigmatism, bilateral: Secondary | ICD-10-CM | POA: Diagnosis not present

## 2022-01-26 LAB — HM DIABETES EYE EXAM

## 2022-01-31 ENCOUNTER — Encounter: Payer: Self-pay | Admitting: Acute Care

## 2022-01-31 ENCOUNTER — Ambulatory Visit (INDEPENDENT_AMBULATORY_CARE_PROVIDER_SITE_OTHER): Payer: Medicare PPO

## 2022-01-31 ENCOUNTER — Ambulatory Visit: Payer: Medicare PPO | Admitting: Acute Care

## 2022-01-31 VITALS — BP 132/76 | HR 63 | Temp 98.3°F | Ht 67.5 in | Wt 220.0 lb

## 2022-01-31 DIAGNOSIS — R059 Cough, unspecified: Secondary | ICD-10-CM | POA: Diagnosis not present

## 2022-01-31 DIAGNOSIS — J069 Acute upper respiratory infection, unspecified: Secondary | ICD-10-CM

## 2022-01-31 DIAGNOSIS — R0982 Postnasal drip: Secondary | ICD-10-CM | POA: Diagnosis not present

## 2022-01-31 MED ORDER — AMOXICILLIN-POT CLAVULANATE 875-125 MG PO TABS: 1.0000 | ORAL_TABLET | Freq: Two times a day (BID) | ORAL | 0 refills | Status: DC

## 2022-01-31 MED ORDER — FLUTICASONE-SALMETEROL 100-50 MCG/ACT IN AEPB: 1.0000 | INHALATION_SPRAY | Freq: Two times a day (BID) | RESPIRATORY_TRACT | 5 refills | Status: DC

## 2022-01-31 NOTE — Progress Notes (Signed)
History of Present Illness Noah Barnes is a 76 y.o. male former smoker with HTN, DM2, hx DVT/PE in 2020 and chronic bronchitis. Marland KitchenHe is followed by Dr. Loanne Drilling.  Maintenance Advair and Spiriva, prn albuterol nebs, and flutter valve as needed for chest congestion    01/31/2022 Pt. Presents for an acute visit with chest tightness . He was last seen in the clinic 11/03/2021 by Dr. Loanne Drilling. At that time he was doing well. He presents today with complaint of chest discomfort in the right upper chest. He has not had any changes in his breathing . He is not short of breath. He is compliant with his Advair and Spiriva. He endorsees rare use of his rescus inhaler. He denies any fevers but endorses post nasal drip and nasal stuffiness. He states the discomfort in his upper right chest is something he usually feels before developing pneumonia. He does not endorse much of a cough or sputum production. He states ha has not been using his flutter valve. He does not use Mucinex as a mucolytic as he does not feel it works for him. He usually has flares in the winter ( his common trigger) . He cannot endorse color of secretions. He wants to follow up with Dr. Loanne Drilling before it gets too cold to be evaluated for increase ICS/LABA and/or nebulizer use for symptom control.    Test Results: CXR 01/31/2022 No active cardiopulmonary disease.  CXR 07/26/21 - No active cardiopulmonary disease. No infiltrate effusion or edema   PFT: 11/03/21 FVC 2.78 (83%) FEV1 2.35 (95%) Ratio 84  TLC 81% DLCO 80% Interpretation: No obstructive or restrictive defect. Normal lung volumes and normal DLCO.     Latest Ref Rng & Units 09/26/2021    1:01 PM 03/25/2021   11:20 AM 12/28/2020    9:25 AM  CBC  WBC 4.0 - 10.5 K/uL 4.4  4.1  4.1   Hemoglobin 13.0 - 17.0 g/dL 13.5  13.2  13.1   Hematocrit 39.0 - 52.0 % 42.3  41.0  40.8   Platelets 150 - 400 K/uL 222  206  200.0        Latest Ref Rng & Units 09/29/2021    8:58 AM 09/26/2021     1:01 PM 05/03/2021    8:29 AM  BMP  Glucose 70 - 99 mg/dL 93  99  104   BUN 6 - 23 mg/dL 31  42  23   Creatinine 0.40 - 1.50 mg/dL 1.58  1.70  1.45   Sodium 135 - 145 mEq/L 138  136  140   Potassium 3.5 - 5.1 mEq/L 4.5  4.2  4.0   Chloride 96 - 112 mEq/L 109  112  112   CO2 19 - 32 mEq/L '20  18  20   ' Calcium 8.4 - 10.5 mg/dL 9.3  9.7  8.9     BNP    Component Value Date/Time   BNP 204.7 (H) 03/24/2019 1608    ProBNP No results found for: "PROBNP"  PFT    Component Value Date/Time   FEV1PRE 2.28 11/03/2021 1258   FEV1POST 2.35 11/03/2021 1258   FVCPRE 2.70 11/03/2021 1258   FVCPOST 2.78 11/03/2021 1258   TLC 5.27 11/03/2021 1258   DLCOUNC 18.58 11/03/2021 1258   PREFEV1FVCRT 84 11/03/2021 1258   PSTFEV1FVCRT 84 11/03/2021 1258    DG Chest 2 View  Result Date: 01/31/2022 CLINICAL DATA:  Cough EXAM: CHEST - 2 VIEW COMPARISON:  AP chest 07/26/2021 FINDINGS: Cardiac  silhouette and mediastinal contours are within normal limits. The lungs are clear. No pleural effusion or pneumothorax. Mild-to-moderate multilevel degenerative disc changes of the midthoracic spine. IMPRESSION: No active cardiopulmonary disease. Electronically Signed   By: Yvonne Kendall M.D.   On: 01/31/2022 12:53     Past medical hx Past Medical History:  Diagnosis Date   CHF (congestive heart failure) (HCC)    Chronic diastolic heart failure (HCC)    Constipation    Diabetes mellitus    Diverticulitis    Diverticulosis    ED (erectile dysfunction)    Gout    Heart murmur    Hyperlipidemia    Hypertension    IBS (irritable bowel syndrome)    Renal calculus    Renal insufficiency    Tubular adenoma of colon      Social History   Tobacco Use   Smoking status: Former    Packs/day: 0.30    Years: 23.00    Total pack years: 6.90    Types: Cigarettes    Quit date: 07/04/1991    Years since quitting: 30.6   Smokeless tobacco: Never   Tobacco comments:    smoked age [age], up to 1/3 ppd; may be  less  Vaping Use   Vaping Use: Never used  Substance Use Topics   Alcohol use: Yes    Alcohol/week: 2.0 standard drinks of alcohol    Types: 2 Glasses of wine per week    Comment: rare   Drug use: No    Mr.Mckenney reports that he quit smoking about 30 years ago. His smoking use included cigarettes. He has a 6.90 pack-year smoking history. He has never used smokeless tobacco. He reports current alcohol use of about 2.0 standard drinks of alcohol per week. He reports that he does not use drugs.  Tobacco Cessation: Former smoker , quit 1993 with a 6.9 pack year smoking history  Past surgical hx, Family hx, Social hx all reviewed.  Current Outpatient Medications on File Prior to Visit  Medication Sig   Accu-Chek Softclix Lancets lancets CHECK BLOOD SUGAR TWICE DAILY AS DIRECTED   albuterol (PROVENTIL) (2.5 MG/3ML) 0.083% nebulizer solution Take 3 mLs (2.5 mg total) by nebulization every 6 (six) hours as needed for wheezing or shortness of breath.   albuterol (VENTOLIN HFA) 108 (90 Base) MCG/ACT inhaler Inhale 2 puffs into the lungs every 6 (six) hours as needed for wheezing or shortness of breath.   allopurinol (ZYLOPRIM) 300 MG tablet Take 1 tablet by mouth once daily   apixaban (ELIQUIS) 2.5 MG TABS tablet Take 1 tablet (2.5 mg total) by mouth 2 (two) times daily.   atorvastatin (LIPITOR) 20 MG tablet Take 1 tablet by mouth once daily   Blood Glucose Monitoring Suppl (ACCU-CHEK GUIDE ME) w/Device KIT 1 each by Does not apply route in the morning and at bedtime. Use Accu Chek Guide me device to check blood sugar twice daily.   FERREX 150 150 MG capsule Take 1 capsule by mouth once daily   furosemide (LASIX) 40 MG tablet Take 40 mg by mouth daily.   glimepiride (AMARYL) 2 MG tablet TAKE 1 TABLET BY MOUTH ONCE DAILY BEFORE SUPPER   glucose blood (ACCU-CHEK GUIDE) test strip USE AS DIRECTED TO TEST BLOOD SUGAR TWICE DAILY   hydrALAZINE (APRESOLINE) 50 MG tablet Take 1 tablet (50 mg total) by  mouth 2 (two) times daily. Follow-up appt due in July must see provider for future refills   LINZESS 290 MCG CAPS capsule  TAKE 1 CAPSULE BY MOUTH ONCE DAILY BEFORE BREAKFAST   metoprolol tartrate (LOPRESSOR) 100 MG tablet Take 1 tablet by mouth twice daily   Semaglutide (RYBELSUS) 14 MG TABS TAKE 1 TABLET BY MOUTH IN THE MORNING 30 MINUTES BEFORE BREAKFAST WITH A SIP OF WATER   sildenafil (VIAGRA) 100 MG tablet TAKE 1 TABLET BY MOUTH EVERY DAY AS NEEDED FOR ERECTILE DYSFUNCTION   tamsulosin (FLOMAX) 0.4 MG CAPS capsule TAKE 1 CAPSULE BY MOUTH ONCE DAILY AFTER SUPPER   Tiotropium Bromide Monohydrate (SPIRIVA RESPIMAT) 2.5 MCG/ACT AERS Inhale 2 puffs into the lungs daily.   No current facility-administered medications on file prior to visit.     Allergies  Allergen Reactions   Ace Inhibitors Other (See Comments)    Severe AKI due to ARB + dehydration Aug 2016   Angiotensin Receptor Blockers Other (See Comments)    Severe AKI due to ARB + dehydration Aug 2016    Review Of Systems:  Constitutional:   No  weight loss, night sweats,  Fevers, chills, fatigue, or  lassitude.  HEENT:   No headaches,  Difficulty swallowing,  Tooth/dental problems, or  Sore throat,                No sneezing, itching, ear ache, nasal congestion, ++ post nasal drip,   CV:  + chest discomfort on right upper chest, No  Orthopnea, PND, swelling in lower extremities, anasarca, dizziness, palpitations, syncope.   GI  No heartburn, indigestion, abdominal pain, nausea, vomiting, diarrhea, change in bowel habits, loss of appetite, bloody stools.   Resp: No shortness of breath with exertion or at rest.  No excess mucus, no productive cough,  No non-productive cough,  No coughing up of blood.  No change in color of mucus.  No wheezing.  No chest wall deformity  Skin: no rash or lesions.  GU: no dysuria, change in color of urine, no urgency or frequency.  No flank pain, no hematuria   MS:  No joint pain or swelling.  No  decreased range of motion.  No back pain.  Psych:  No change in mood or affect. No depression or anxiety.  No memory loss.   Vital Signs BP 132/76 (BP Location: Left Arm, Cuff Size: Normal)   Pulse 63   Temp 98.3 F (36.8 C) (Oral)   Ht 5' 7.5" (1.715 m)   Wt 220 lb (99.8 kg)   SpO2 95% Comment: on RA  BMI 33.95 kg/m    Physical Exam:  General- No distress,  A&Ox3, pleasant ENT: No sinus tenderness, TM clear, pale nasal mucosa, no oral exudate,no post nasal drip, no LAN Cardiac: S1, S2, regular rate and rhythm, no murmur Chest: No wheeze/ rales/ dullness; no accessory muscle use, no nasal flaring, no sternal retractions, dimiished per bases Abd.: Soft Non-tender, ND, BS +, Body mass index is 33.95 kg/m.  Ext: No clubbing cyanosis, edema Neuro:  normal strength, MAE x 4, A&O x 3 Skin: No rashes, warm and dry Psych: normal mood and behavior   Assessment/Plan Chest discomfort ( feels like when he has had pneumonia in the past)  and increased PND Plan We will order a CXR today ( stat)  We will call you with results. Start using flutter valve several times daily  Start Mucinex 1200 mg once daily with a full glass of water.  Augmentin 875 mg twice daily for 7 days Add probiotic if you develop any diarrhea.  Continue Advair and Spiriva, prn albuterol nebs We  will send in a refill for your Advair.  Non-sedating antihistamine of your choice daily ( Zyrtec, Allegra, Xyzol, Claritin ( Generic ok)  Follow up with Dr. Loanne Drilling in August or early September  to discuss winter management of your symptoms before the cold trigger a flare of your bronchitis.  Please contact office for sooner follow up if symptoms do not improve or worsen or seek emergency care    I spent 30 minutes dedicated to the care of this patient on the date of this encounter to include pre-visit review of records, face-to-face time with the patient discussing conditions above, post visit ordering of testing, clinical  documentation with the electronic health record, making appropriate referrals as documented, and communicating necessary information to the patient's healthcare team.   Magdalen Spatz, NP 01/31/2022  5:27 PM

## 2022-01-31 NOTE — Patient Instructions (Addendum)
It is good to see you today. We will order a CXR today ( stat)  We will call you with results. Start using flutter valve several times daily  Start Mucinex 1200 mg once daily with a full glass of water.  Augmentin 875 mg twice daily for 7 days Add probiotic if you develop any diarrhea.  Continue Advair and Spiriva, prn albuterol nebs We will send in a refill for your Advair.  Follow up with Dr. Loanne Drilling in August or early September  to discuss winter management of your symptoms before the cold trigger a flare of your bronchitis.  Please contact office for sooner follow up if symptoms do not improve or worsen or seek emergency care

## 2022-02-01 ENCOUNTER — Telehealth: Payer: Self-pay | Admitting: Acute Care

## 2022-02-01 ENCOUNTER — Other Ambulatory Visit: Payer: Self-pay | Admitting: Internal Medicine

## 2022-02-01 NOTE — Telephone Encounter (Signed)
Please call patient and let him know his CXR was clear. No signs of pneumonia. Thanks

## 2022-02-01 NOTE — Telephone Encounter (Signed)
I called the patient and he reports that he had seen the results on Mychart and he did not have any questions. Nothing further needed.

## 2022-02-03 ENCOUNTER — Other Ambulatory Visit: Payer: Self-pay | Admitting: *Deleted

## 2022-02-03 MED ORDER — SPIRIVA RESPIMAT 2.5 MCG/ACT IN AERS: 2.0000 | INHALATION_SPRAY | Freq: Every day | RESPIRATORY_TRACT | 5 refills | Status: DC

## 2022-02-06 ENCOUNTER — Other Ambulatory Visit (INDEPENDENT_AMBULATORY_CARE_PROVIDER_SITE_OTHER): Payer: Medicare PPO

## 2022-02-06 DIAGNOSIS — E119 Type 2 diabetes mellitus without complications: Secondary | ICD-10-CM

## 2022-02-06 LAB — BASIC METABOLIC PANEL
BUN: 29 mg/dL — ABNORMAL HIGH (ref 6–23)
CO2: 18 mEq/L — ABNORMAL LOW (ref 19–32)
Calcium: 8.9 mg/dL (ref 8.4–10.5)
Chloride: 114 mEq/L — ABNORMAL HIGH (ref 96–112)
Creatinine, Ser: 1.66 mg/dL — ABNORMAL HIGH (ref 0.40–1.50)
GFR: 40.19 mL/min — ABNORMAL LOW (ref >60.00)
Glucose, Bld: 98 mg/dL (ref 70–99)
Potassium: 4.2 mEq/L (ref 3.5–5.1)
Sodium: 134 mEq/L — ABNORMAL LOW (ref 135–145)

## 2022-02-06 LAB — HEMOGLOBIN A1C: Hgb A1c MFr Bld: 6.1 % (ref 4.6–6.5)

## 2022-02-07 ENCOUNTER — Encounter: Payer: Self-pay | Admitting: Internal Medicine

## 2022-02-07 NOTE — Progress Notes (Unsigned)
Subjective:    Patient ID: Noah Barnes, male    DOB: Jun 08, 1946, 76 y.o.   MRN: [id number]     HPI Nobuo is here for a physical exam.   Still has feeling of cold in his chest - cxr normal.  He occasionally brings up phlegm.  He is following with pulm and has chronic bronchitis.  The inhalers help a little.       Medications and allergies reviewed with patient and updated if appropriate.  Current Outpatient Medications on File Prior to Visit  Medication Sig Dispense Refill   Accu-Chek Softclix Lancets lancets CHECK BLOOD SUGAR TWICE DAILY AS DIRECTED 100 each 2   albuterol (PROVENTIL) (2.5 MG/3ML) 0.083% nebulizer solution Take 3 mLs (2.5 mg total) by nebulization every 6 (six) hours as needed for wheezing or shortness of breath. 75 mL 5   albuterol (VENTOLIN HFA) 108 (90 Base) MCG/ACT inhaler Inhale 2 puffs into the lungs every 6 (six) hours as needed for wheezing or shortness of breath. 8 g 0   allopurinol (ZYLOPRIM) 300 MG tablet Take 1 tablet by mouth once daily 90 tablet 0   apixaban (ELIQUIS) 2.5 MG TABS tablet Take 1 tablet (2.5 mg total) by mouth 2 (two) times daily. 180 tablet 2   atorvastatin (LIPITOR) 20 MG tablet Take 1 tablet by mouth once daily 90 tablet 0   Blood Glucose Monitoring Suppl (ACCU-CHEK GUIDE ME) w/Device KIT 1 each by Does not apply route in the morning and at bedtime. Use Accu Chek Guide me device to check blood sugar twice daily. 1 kit 0   FERREX 150 150 MG capsule Take 1 capsule by mouth once daily 90 capsule 0   fluticasone-salmeterol (ADVAIR DISKUS) 100-50 MCG/ACT AEPB Inhale 1 puff into the lungs 2 (two) times daily. 60 each 5   furosemide (LASIX) 40 MG tablet Take 40 mg by mouth daily.     glimepiride (AMARYL) 2 MG tablet TAKE 1 TABLET BY MOUTH ONCE DAILY BEFORE SUPPER 90 tablet 1   glucose blood (ACCU-CHEK GUIDE) test strip USE AS DIRECTED TO TEST BLOOD SUGAR TWICE DAILY 100 strip 3   hydrALAZINE (APRESOLINE) 50 MG tablet Take 1 tablet (50 mg  total) by mouth 2 (two) times daily. Follow-up appt due in July must see provider for future refills 180 tablet 0   LINZESS 290 MCG CAPS capsule TAKE 1 CAPSULE BY MOUTH ONCE DAILY BEFORE BREAKFAST 90 capsule 0   metoprolol tartrate (LOPRESSOR) 100 MG tablet Take 1 tablet by mouth twice daily 180 tablet 0   Semaglutide (RYBELSUS) 14 MG TABS TAKE 1 TABLET BY MOUTH IN THE MORNING 30 MINUTES BEFORE BREAKFAST WITH A SIP OF WATER 30 tablet 3   sildenafil (VIAGRA) 100 MG tablet TAKE 1 TABLET BY MOUTH EVERY DAY AS NEEDED FOR ERECTILE DYSFUNCTION 6 tablet 1   tamsulosin (FLOMAX) 0.4 MG CAPS capsule TAKE 1 CAPSULE BY MOUTH ONCE DAILY AFTER SUPPER 90 capsule 0   Tiotropium Bromide Monohydrate (SPIRIVA RESPIMAT) 2.5 MCG/ACT AERS Inhale 2 puffs into the lungs daily. 4 g 5   No current facility-administered medications on file prior to visit.    Review of Systems  Constitutional:  Negative for chills and fever.  Eyes:  Negative for visual disturbance.  Respiratory:  Positive for cough (intermittent - occ brings up phlegm) and shortness of breath (occ). Negative for wheezing.   Cardiovascular:  Negative for chest pain, palpitations and leg swelling.  Gastrointestinal:  Positive for constipation (controlled w/ linzess).  Negative for abdominal pain, blood in stool, diarrhea and nausea.  Genitourinary:  Negative for difficulty urinating, dysuria and hematuria.  Musculoskeletal:  Positive for arthralgias (shoulders). Negative for back pain.  Skin:  Negative for rash.  Neurological:  Negative for light-headedness and headaches.  Psychiatric/Behavioral:  Negative for dysphoric mood. The patient is not nervous/anxious.        Objective:   Vitals:   02/08/22 0748  BP: 126/72  Pulse: 65  Temp: 98.1 F (36.7 C)  SpO2: 94%   Filed Weights   02/08/22 0748  Weight: 220 lb (99.8 kg)   Body mass index is 33.95 kg/m.  BP Readings from Last 3 Encounters:  02/08/22 126/72  01/31/22 132/76  01/16/22  122/60    Wt Readings from Last 3 Encounters:  02/08/22 220 lb (99.8 kg)  01/31/22 220 lb (99.8 kg)  01/16/22 212 lb 9.6 oz (96.4 kg)      Physical Exam Constitutional: He appears well-developed and well-nourished. No distress.  HENT:  Head: Normocephalic and atraumatic.  Right Ear: External ear normal.  Left Ear: External ear normal.  Mouth/Throat: Oropharynx is clear and moist.  Normal ear canals and TM b/l  Eyes: Conjunctivae and EOM are normal.  Neck: Neck supple. No tracheal deviation present. No thyromegaly present.  No carotid bruit  Cardiovascular: Normal rate, regular rhythm, normal heart sounds and intact distal pulses.   No murmur heard. Pulmonary/Chest: Effort normal and breath sounds normal. No respiratory distress. He has no wheezes. He has no rales.  Abdominal: Soft. He exhibits no distension. There is no tenderness.  Genitourinary: deferred  Musculoskeletal: He has 1+ b/l pitting LE edema Lymphadenopathy:   He has no cervical adenopathy.  Skin: Skin is warm and dry. He is not diaphoretic.  Psychiatric: He has a normal mood and affect. His behavior is normal.         Assessment & Plan:   Physical exam: Screening blood work  ordered Exercise   not regular - walking some Weight  work on weight loss Substance abuse   none   Reviewed recommended immunizations.  Discussed shingrix, td, covid vaccine.    Health Maintenance  Topic Date Due   COVID-19 Vaccine (6 - Pfizer series) 10/07/2021   OPHTHALMOLOGY EXAM  01/18/2022   Zoster Vaccines- Shingrix (1 of 2) 05/11/2022 (Originally 01/03/1997)   INFLUENZA VACCINE  10/01/2022 (Originally 01/31/2022)   TETANUS/TDAP  02/09/2023 (Originally 01/03/1966)   HEMOGLOBIN A1C  08/09/2022   Diabetic kidney evaluation - Urine ACR  09/30/2022   FOOT EXAM  10/06/2022   Diabetic kidney evaluation - GFR measurement  02/07/2023   COLONOSCOPY (Pts 45-26yr Insurance coverage will need to be confirmed)  02/22/2025   Pneumonia  Vaccine 75 Years old  Completed   Hepatitis C Screening  Completed   HPV VACCINES  Aged Out     See Problem List for Assessment and Plan of chronic medical problems.

## 2022-02-07 NOTE — Patient Instructions (Addendum)
Blood work was ordered.     Medications changes include :   none    Return in about 6 months (around 08/11/2022) for follow up.    Health Maintenance, Male Adopting a healthy lifestyle and getting preventive care are important in promoting health and wellness. Ask your health care provider about: The right schedule for you to have regular tests and exams. Things you can do on your own to prevent diseases and keep yourself healthy. What should I know about diet, weight, and exercise? Eat a healthy diet  Eat a diet that includes plenty of vegetables, fruits, low-fat dairy products, and lean protein. Do not eat a lot of foods that are high in solid fats, added sugars, or sodium. Maintain a healthy weight Body mass index (BMI) is a measurement that can be used to identify possible weight problems. It estimates body fat based on height and weight. Your health care provider can help determine your BMI and help you achieve or maintain a healthy weight. Get regular exercise Get regular exercise. This is one of the most important things you can do for your health. Most adults should: Exercise for at least 150 minutes each week. The exercise should increase your heart rate and make you sweat (moderate-intensity exercise). Do strengthening exercises at least twice a week. This is in addition to the moderate-intensity exercise. Spend less time sitting. Even light physical activity can be beneficial. Watch cholesterol and blood lipids Have your blood tested for lipids and cholesterol at 75 years of age, then have this test every 5 years. You may need to have your cholesterol levels checked more often if: Your lipid or cholesterol levels are high. You are older than 75 years of age. You are at high risk for heart disease. What should I know about cancer screening? Many types of cancers can be detected early and may often be prevented. Depending on your health history and family history, you  may need to have cancer screening at various ages. This may include screening for: Colorectal cancer. Prostate cancer. Skin cancer. Lung cancer. What should I know about heart disease, diabetes, and high blood pressure? Blood pressure and heart disease High blood pressure causes heart disease and increases the risk of stroke. This is more likely to develop in people who have high blood pressure readings or are overweight. Talk with your health care provider about your target blood pressure readings. Have your blood pressure checked: Every 3-5 years if you are 62-42 years of age. Every year if you are 87 years old or older. If you are between the ages of 11 and 35 and are a current or former smoker, ask your health care provider if you should have a one-time screening for abdominal aortic aneurysm (AAA). Diabetes Have regular diabetes screenings. This checks your fasting blood sugar level. Have the screening done: Once every three years after age 58 if you are at a normal weight and have a low risk for diabetes. More often and at a younger age if you are overweight or have a high risk for diabetes. What should I know about preventing infection? Hepatitis B If you have a higher risk for hepatitis B, you should be screened for this virus. Talk with your health care provider to find out if you are at risk for hepatitis B infection. Hepatitis C Blood testing is recommended for: Everyone born from [birth year] through 1964. Anyone with known risk factors for hepatitis C. Sexually transmitted infections (STIs) You  should be screened each year for STIs, including gonorrhea and chlamydia, if: You are sexually active and are younger than 75 years of age. You are older than 75 years of age and your health care provider tells you that you are at risk for this type of infection. Your sexual activity has changed since you were last screened, and you are at increased risk for chlamydia or gonorrhea. Ask your  health care provider if you are at risk. Ask your health care provider about whether you are at high risk for HIV. Your health care provider may recommend a prescription medicine to help prevent HIV infection. If you choose to take medicine to prevent HIV, you should first get tested for HIV. You should then be tested every 3 months for as long as you are taking the medicine. Follow these instructions at home: Alcohol use Do not drink alcohol if your health care provider tells you not to drink. If you drink alcohol: Limit how much you have to 0-2 drinks a day. Know how much alcohol is in your drink. In the [place] one drink equals one 12 oz bottle of beer (355 mL), one 5 oz glass of wine (148 mL), or one 1 oz glass of hard liquor (44 mL). Lifestyle Do not use any products that contain nicotine or tobacco. These products include cigarettes, chewing tobacco, and vaping devices, such as e-cigarettes. If you need help quitting, ask your health care provider. Do not use street drugs. Do not share needles. Ask your health care provider for help if you need support or information about quitting drugs. General instructions Schedule regular health, dental, and eye exams. Stay current with your vaccines. Tell your health care provider if: You often feel depressed. You have ever been abused or do not feel safe at home. Summary Adopting a healthy lifestyle and getting preventive care are important in promoting health and wellness. Follow your health care provider's instructions about healthy diet, exercising, and getting tested or screened for diseases. Follow your health care provider's instructions on monitoring your cholesterol and blood pressure. This information is not intended to replace advice given to you by your health care provider. Make sure you discuss any questions you have with your health care provider. Document Revised: 11/08/2020 Document Reviewed: 11/08/2020 Elsevier Patient Education   [place].

## 2022-02-08 ENCOUNTER — Ambulatory Visit (INDEPENDENT_AMBULATORY_CARE_PROVIDER_SITE_OTHER): Payer: Medicare PPO | Admitting: Internal Medicine

## 2022-02-08 VITALS — BP 126/72 | HR 65 | Temp 98.1°F | Ht 67.5 in | Wt 220.0 lb

## 2022-02-08 DIAGNOSIS — N401 Enlarged prostate with lower urinary tract symptoms: Secondary | ICD-10-CM

## 2022-02-08 DIAGNOSIS — E1129 Type 2 diabetes mellitus with other diabetic kidney complication: Secondary | ICD-10-CM | POA: Diagnosis not present

## 2022-02-08 DIAGNOSIS — I10 Essential (primary) hypertension: Secondary | ICD-10-CM | POA: Diagnosis not present

## 2022-02-08 DIAGNOSIS — N1831 Chronic kidney disease, stage 3a: Secondary | ICD-10-CM

## 2022-02-08 DIAGNOSIS — E782 Mixed hyperlipidemia: Secondary | ICD-10-CM

## 2022-02-08 DIAGNOSIS — Z0000 Encounter for general adult medical examination without abnormal findings: Secondary | ICD-10-CM | POA: Diagnosis not present

## 2022-02-08 DIAGNOSIS — I872 Venous insufficiency (chronic) (peripheral): Secondary | ICD-10-CM | POA: Diagnosis not present

## 2022-02-08 DIAGNOSIS — M109 Gout, unspecified: Secondary | ICD-10-CM | POA: Diagnosis not present

## 2022-02-08 LAB — CBC WITH DIFFERENTIAL/PLATELET
Basophils Absolute: 0 10*3/uL (ref 0.0–0.1)
Basophils Relative: 0.8 % (ref 0.0–3.0)
Eosinophils Absolute: 0.1 10*3/uL (ref 0.0–0.7)
Eosinophils Relative: 3.6 % (ref 0.0–5.0)
HCT: 40.3 % (ref 39.0–52.0)
Hemoglobin: 12.9 g/dL — ABNORMAL LOW (ref 13.0–17.0)
Lymphocytes Relative: 31.7 % (ref 12.0–46.0)
Lymphs Abs: 1.2 10*3/uL (ref 0.7–4.0)
MCHC: 32.1 g/dL (ref 30.0–36.0)
MCV: 84.4 fl (ref 78.0–100.0)
Monocytes Absolute: 0.4 10*3/uL (ref 0.1–1.0)
Monocytes Relative: 9.9 % (ref 3.0–12.0)
Neutro Abs: 2 10*3/uL (ref 1.4–7.7)
Neutrophils Relative %: 54 % (ref 43.0–77.0)
Platelets: 170 10*3/uL (ref 150.0–400.0)
RBC: 4.77 Mil/uL (ref 4.22–5.81)
RDW: 14.9 % (ref 11.5–15.5)
WBC: 3.7 10*3/uL — ABNORMAL LOW (ref 4.0–10.5)

## 2022-02-08 LAB — LIPID PANEL
Cholesterol: 124 mg/dL (ref 0–200)
HDL: 46.3 mg/dL (ref >39.00)
LDL Cholesterol: 66 mg/dL (ref 0–99)
NonHDL: 77.58
Total CHOL/HDL Ratio: 3
Triglycerides: 59 mg/dL (ref 0.0–149.0)
VLDL: 11.8 mg/dL (ref 0.0–40.0)

## 2022-02-08 LAB — COMPREHENSIVE METABOLIC PANEL
ALT: 51 U/L (ref 0–53)
AST: 24 U/L (ref 0–37)
Albumin: 4 g/dL (ref 3.5–5.2)
Alkaline Phosphatase: 80 U/L (ref 39–117)
BUN: 28 mg/dL — ABNORMAL HIGH (ref 6–23)
CO2: 19 mEq/L (ref 19–32)
Calcium: 9 mg/dL (ref 8.4–10.5)
Chloride: 110 mEq/L (ref 96–112)
Creatinine, Ser: 1.51 mg/dL — ABNORMAL HIGH (ref 0.40–1.50)
GFR: 45.03 mL/min — ABNORMAL LOW (ref >60.00)
Glucose, Bld: 98 mg/dL (ref 70–99)
Potassium: 3.9 mEq/L (ref 3.5–5.1)
Sodium: 136 mEq/L (ref 135–145)
Total Bilirubin: 0.5 mg/dL (ref 0.2–1.2)
Total Protein: 6.6 g/dL (ref 6.0–8.3)

## 2022-02-08 LAB — URIC ACID: Uric Acid, Serum: 3.6 mg/dL — ABNORMAL LOW (ref 4.0–7.8)

## 2022-02-08 NOTE — Assessment & Plan Note (Signed)
Chronic Blood pressure well controlled CMP Continue metoprolol 100 mg twice daily, hydralazine 50 mg twice daily

## 2022-02-08 NOTE — Assessment & Plan Note (Signed)
Chronic Controlled No recent flares Check uric acid level Continue allopurinol 300 mg daily

## 2022-02-08 NOTE — Assessment & Plan Note (Signed)
Chronic Following with nephrology Kidney function has been stable CBC, CMP

## 2022-02-08 NOTE — Assessment & Plan Note (Signed)
Chronic Controlled, Stable Continue Flomax 0.4 mg

## 2022-02-08 NOTE — Assessment & Plan Note (Addendum)
Chronic Continue Lasix 40 mg daily prn - takes 2-3 times a week Regular exercise, elevation of legs, low-sodium diet encouraged

## 2022-02-08 NOTE — Assessment & Plan Note (Signed)
Chronic °Regular exercise and healthy diet encouraged °Check lipid panel  °Continue atorvastatin 20 mg daily °

## 2022-02-08 NOTE — Assessment & Plan Note (Signed)
Chronic Lab Results  Component Value Date   HGBA1C 6.1 02/06/2022   Sugars have been well-controlled Management per endocrine

## 2022-02-09 ENCOUNTER — Encounter: Payer: Self-pay | Admitting: Endocrinology

## 2022-02-09 ENCOUNTER — Ambulatory Visit: Payer: Medicare PPO | Admitting: Endocrinology

## 2022-02-09 VITALS — BP 130/78 | HR 72 | Ht 67.5 in | Wt 220.8 lb

## 2022-02-09 DIAGNOSIS — E119 Type 2 diabetes mellitus without complications: Secondary | ICD-10-CM | POA: Diagnosis not present

## 2022-02-09 NOTE — Progress Notes (Signed)
Patient ID: Noah Barnes, male   DOB: [birth year], 76 y.o.   MRN: [id number]    Reason for Appointment : Follow-up visit - History of Present Illness          Diagnosis: Type 2 diabetes mellitus, date of diagnosis: 2008       Past history: His diabetes was borderline in the beginning and not clear what medications he was started on. Has been taking metformin for several years and this was later changed to [place]. Amaryl probably started in 5/14 when blood sugars were higher, initially was given 1 mg and now has been taking 2 mg. He had a relatively high A1c of 7.1, difficulty losing weight and a glucose of 202 on his initial consultation in 06/2013 He was then tried on Invokana in addition to his Janumet and Amaryl Invokana was reduced to 100 mg in early 2015 been creatinine had gone up to 1.6  Recent history:   Oral hypoglycemic drugs the patient is taking are: Amaryl 2  mg at supper, Rybelsus 14 mg daily   His A1c is consistently in the normal range at 6.1, previously was at 5.9 His previous range 5.8-6.6 Fructosamine last 241  Current management, blood sugar patterns and problems identified:    He did bring his monitor for review today  He has excellent readings at home in the low 100s even after meals  He is used to trying to watch his portions However still not able to lose weight  No hypoglycemia with 2 mg dose of Amaryl He does take his Rybelsus consistently before breakfast as directed 30 minutes before eating  He has been going to the gym/[employer] and walking now 3/7 days a week No nausea with Rybelsus and had no difficulty with getting this consistently   Side effects from medications have been:  renal dysfunction from Invokana  Glucose monitoring:  done <1 times a day         Glucometer:  Accu-Chek   Blood Glucose readings by meter review:   PRE-MEAL Fasting Lunch Dinner Bedtime Overall  Glucose range: 91-116      Mean/median:     111   POST-MEAL PC  Breakfast PC Lunch PC Dinner  Glucose range:   101-135  Mean/median:      Previously:  PRE-MEAL Fasting Lunch Dinner Bedtime Overall  Glucose range: 90-110      Mean/median:     ?   POST-MEAL PC Breakfast PC Lunch PC Dinner  Glucose range:   124-135  Mean/median:      Previous data:  PRE-MEAL Fasting Lunch Dinner Bedtime Overall  Glucose range:       Mean/median: 108    113   POST-MEAL PC Breakfast PC Lunch PC Dinner  Glucose range:   107-192  Mean/median:   119   Glycemic control:   Lab Results  Component Value Date   HGBA1C 6.1 02/06/2022   HGBA1C 5.9 09/29/2021   HGBA1C 5.8 05/03/2021   Lab Results  Component Value Date   MICROALBUR 3.0 (H) 09/29/2021   LDLCALC 66 02/08/2022   CREATININE 1.51 (H) 02/08/2022    Self-care: Usually has low-fat diet, eggs in am with some carbohydrate, frequently no lunch     Dietician consultations: 08/2013              Weight history:   Wt Readings from Last 3 Encounters:  02/09/22 220 lb 12.8 oz (100.2 kg)  02/08/22 220 lb (99.8 kg)  01/31/22 220  lb (99.8 kg)       Allergies as of 02/09/2022       Reactions   Ace Inhibitors Other (See Comments)   Severe AKI due to ARB + dehydration Aug 2016   Angiotensin Receptor Blockers Other (See Comments)   Severe AKI due to ARB + dehydration Aug 2016        Medication List        Accurate as of February 09, 2022  9:50 AM. If you have any questions, ask your nurse or doctor.          Accu-Chek Guide Me w/Device Kit 1 each by Does not apply route in the morning and at bedtime. Use Accu Chek Guide me device to check blood sugar twice daily.   Accu-Chek Guide test strip Generic drug: glucose blood USE AS DIRECTED TO TEST BLOOD SUGAR TWICE DAILY   Accu-Chek Softclix Lancets lancets CHECK BLOOD SUGAR TWICE DAILY AS DIRECTED   albuterol 108 (90 Base) MCG/ACT inhaler Commonly known as: VENTOLIN HFA Inhale 2 puffs into the lungs every 6 (six) hours as needed for  wheezing or shortness of breath.   albuterol (2.5 MG/3ML) 0.083% nebulizer solution Commonly known as: PROVENTIL Take 3 mLs (2.5 mg total) by nebulization every 6 (six) hours as needed for wheezing or shortness of breath.   allopurinol 300 MG tablet Commonly known as: ZYLOPRIM Take 1 tablet by mouth once daily   apixaban 2.5 MG Tabs tablet Commonly known as: Eliquis Take 1 tablet (2.5 mg total) by mouth 2 (two) times daily.   atorvastatin 20 MG tablet Commonly known as: LIPITOR Take 1 tablet by mouth once daily   Ferrex 150 150 MG capsule Generic drug: iron polysaccharides Take 1 capsule by mouth once daily   fluticasone-salmeterol 100-50 MCG/ACT Aepb Commonly known as: Advair Diskus Inhale 1 puff into the lungs 2 (two) times daily.   furosemide 40 MG tablet Commonly known as: LASIX Take 40 mg by mouth daily.   glimepiride 2 MG tablet Commonly known as: AMARYL TAKE 1 TABLET BY MOUTH ONCE DAILY BEFORE SUPPER   hydrALAZINE 50 MG tablet Commonly known as: APRESOLINE Take 1 tablet (50 mg total) by mouth 2 (two) times daily. Follow-up appt due in July must see provider for future refills   Linzess 290 MCG Caps capsule Generic drug: linaclotide TAKE 1 CAPSULE BY MOUTH ONCE DAILY BEFORE BREAKFAST   metoprolol tartrate 100 MG tablet Commonly known as: LOPRESSOR Take 1 tablet by mouth twice daily   Rybelsus 14 MG Tabs Generic drug: Semaglutide TAKE 1 TABLET BY MOUTH IN THE MORNING 30 MINUTES BEFORE BREAKFAST WITH A SIP OF WATER   sildenafil 100 MG tablet Commonly known as: VIAGRA TAKE 1 TABLET BY MOUTH EVERY DAY AS NEEDED FOR ERECTILE DYSFUNCTION   Spiriva Respimat 2.5 MCG/ACT Aers Generic drug: Tiotropium Bromide Monohydrate Inhale 2 puffs into the lungs daily.   tamsulosin 0.4 MG Caps capsule Commonly known as: FLOMAX TAKE 1 CAPSULE BY MOUTH ONCE DAILY AFTER SUPPER        Allergies:  Allergies  Allergen Reactions   Ace Inhibitors Other (See Comments)     Severe AKI due to ARB + dehydration Aug 2016   Angiotensin Receptor Blockers Other (See Comments)    Severe AKI due to ARB + dehydration Aug 2016    Past Medical History:  Diagnosis Date   CHF (congestive heart failure) (HCC)    Chronic diastolic heart failure (HCC)    Constipation    Diabetes  mellitus    Diverticulitis    Diverticulosis    ED (erectile dysfunction)    Gout    Heart murmur    Hyperlipidemia    Hypertension    IBS (irritable bowel syndrome)    Renal calculus    Renal insufficiency    Tubular adenoma of colon     Past Surgical History:  Procedure Laterality Date   COLONOSCOPY  1998   negative; no F/U   EXTRACORPOREAL SHOCK WAVE LITHOTRIPSY Left 03/20/2019   Procedure: EXTRACORPOREAL SHOCK WAVE LITHOTRIPSY (ESWL);  Surgeon: Cleon Gustin, MD;  Location: [facility];  Service: Urology;  Laterality: Left;   knee effusion tapped      Family History  Problem Relation Age of Onset   Heart attack Mother 39   Hypertension Mother    Prostate cancer Father 71   Diabetes Father    Irritable bowel syndrome Father    Irritable bowel syndrome Sister        x 2   Colon cancer Brother        dx in his late 42's   Heart disease Brother    Irritable bowel syndrome Brother    Stroke Neg Hx    Esophageal cancer Neg Hx    Rectal cancer Neg Hx    Stomach cancer Neg Hx     Social History:  reports that he quit smoking about 30 years ago. His smoking use included cigarettes. He has a 6.90 pack-year smoking history. He has never used smokeless tobacco. He reports current alcohol use of about 2.0 standard drinks of alcohol per week. He reports that he does not use drugs.    Review of Systems   RENAL dysfunction: Creatinine is as follows, followed by nephrologist  Lab Results  Component Value Date   CREATININE 1.51 (H) 02/08/2022   CREATININE 1.66 (H) 02/06/2022   CREATININE 1.58 (H) 09/29/2021   CREATININE 1.70 (H) 09/26/2021     Lab Results  Component Value  Date   K 3.9 02/08/2022         Lipids: Has been on Lipitor 20 mg for hyperlipidemia, baseline LDL about 162 Followed by Dr. Quay Burow  Lab Results  Component Value Date   CHOL 124 02/08/2022   HDL 46.30 02/08/2022   LDLCALC 66 02/08/2022   LDLDIRECT 80.1 04/15/2014   TRIG 59.0 02/08/2022   CHOLHDL 3 02/08/2022       The blood pressure has been high for several years and usually well controlled.  Currently taking, hydralazine, and metoprolol from his PCP Also monitoring at home  BP Readings from Last 3 Encounters:  02/09/22 130/78  02/08/22 126/72  01/31/22 132/76      LABS:  Office Visit on 02/08/2022  Component Date Value Ref Range Status   Uric Acid, Serum 02/08/2022 3.6 (L)  4.0 - 7.8 mg/dL Final   Cholesterol 02/08/2022 124  0 - 200 mg/dL Final   ATP III Classification       Desirable:  < 200 mg/dL               Borderline High:  200 - 239 mg/dL          High:  > = 240 mg/dL   Triglycerides 02/08/2022 59.0  0.0 - 149.0 mg/dL Final   Normal:  <150 mg/dLBorderline High:  150 - 199 mg/dL   HDL 02/08/2022 46.30  >39.00 mg/dL Final   VLDL 02/08/2022 11.8  0.0 - 40.0 mg/dL Final   LDL Cholesterol 02/08/2022  66  0 - 99 mg/dL Final   Total CHOL/HDL Ratio 02/08/2022 3   Final                  Men          Women1/2 Average Risk     3.4          3.3Average Risk          5.0          4.42X Average Risk          9.6          7.13X Average Risk          15.0          11.0                       NonHDL 02/08/2022 77.58   Final   NOTE:  Non-HDL goal should be 30 mg/dL higher than patient's LDL goal (i.e. LDL goal of < 70 mg/dL, would have non-HDL goal of < 100 mg/dL)   Sodium 02/08/2022 136  135 - 145 mEq/L Final   Potassium 02/08/2022 3.9  3.5 - 5.1 mEq/L Final   Chloride 02/08/2022 110  96 - 112 mEq/L Final   CO2 02/08/2022 19  19 - 32 mEq/L Final   Glucose, Bld 02/08/2022 98  70 - 99 mg/dL Final   BUN 02/08/2022 28 (H)  6 - 23 mg/dL Final   Creatinine, Ser 02/08/2022 1.51 (H)   0.40 - 1.50 mg/dL Final   Total Bilirubin 02/08/2022 0.5  0.2 - 1.2 mg/dL Final   Alkaline Phosphatase 02/08/2022 80  39 - 117 U/L Final   AST 02/08/2022 24  0 - 37 U/L Final   ALT 02/08/2022 51  0 - 53 U/L Final   Total Protein 02/08/2022 6.6  6.0 - 8.3 g/dL Final   Albumin 02/08/2022 4.0  3.5 - 5.2 g/dL Final   GFR 02/08/2022 45.03 (L)  >60.00 mL/min Final   Calculated using the CKD-EPI Creatinine Equation (2021)   Calcium 02/08/2022 9.0  8.4 - 10.5 mg/dL Final   WBC 02/08/2022 3.7 (L)  4.0 - 10.5 K/uL Final   RBC 02/08/2022 4.77  4.22 - 5.81 Mil/uL Final   Hemoglobin 02/08/2022 12.9 (L)  13.0 - 17.0 g/dL Final   HCT 02/08/2022 40.3  39.0 - 52.0 % Final   MCV 02/08/2022 84.4  78.0 - 100.0 fl Final   MCHC 02/08/2022 32.1  30.0 - 36.0 g/dL Final   RDW 02/08/2022 14.9  11.5 - 15.5 % Final   Platelets 02/08/2022 170.0  150.0 - 400.0 K/uL Final   Neutrophils Relative % 02/08/2022 54.0  43.0 - 77.0 % Final   Lymphocytes Relative 02/08/2022 31.7  12.0 - 46.0 % Final   Monocytes Relative 02/08/2022 9.9  3.0 - 12.0 % Final   Eosinophils Relative 02/08/2022 3.6  0.0 - 5.0 % Final   Basophils Relative 02/08/2022 0.8  0.0 - 3.0 % Final   Neutro Abs 02/08/2022 2.0  1.4 - 7.7 K/uL Final   Lymphs Abs 02/08/2022 1.2  0.7 - 4.0 K/uL Final   Monocytes Absolute 02/08/2022 0.4  0.1 - 1.0 K/uL Final   Eosinophils Absolute 02/08/2022 0.1  0.0 - 0.7 K/uL Final   Basophils Absolute 02/08/2022 0.0  0.0 - 0.1 K/uL Final  Lab on 02/06/2022  Component Date Value Ref Range Status   Sodium 02/06/2022 134 (L)  135 - 145 mEq/L Final  Potassium 02/06/2022 4.2  3.5 - 5.1 mEq/L Final   Chloride 02/06/2022 114 (H)  96 - 112 mEq/L Final   CO2 02/06/2022 18 (L)  19 - 32 mEq/L Final   Glucose, Bld 02/06/2022 98  70 - 99 mg/dL Final   BUN 02/06/2022 29 (H)  6 - 23 mg/dL Final   Creatinine, Ser 02/06/2022 1.66 (H)  0.40 - 1.50 mg/dL Final   GFR 02/06/2022 40.19 (L)  >60.00 mL/min Final   Calculated using the CKD-EPI  Creatinine Equation (2021)   Calcium 02/06/2022 8.9  8.4 - 10.5 mg/dL Final   Hgb A1c MFr Bld 02/06/2022 6.1  4.6 - 6.5 % Final   Glycemic Control Guidelines for People with Diabetes:Non Diabetic:  <6%Goal of Therapy: <7%Additional Action Suggested:  >8%     Physical Examination:  BP 130/78   Pulse 72   Ht 5' 7.5" (1.715 m)   Wt 220 lb 12.8 oz (100.2 kg)   SpO2 93%   BMI 34.07 kg/m     ASSESSMENT/PLAN:   Diabetes type 2, with obesity  See history of present illness for detailed discussion of current diabetes management, blood sugar patterns and problems identified  He is on Rybelsus 14 mg and Amaryl 2 mg  A1c is 6.1 and about the same again  Blood sugars are as before very consistently controlled  She is benefiting from Rybelsus and has no side effect Also has generally doing better with glucose monitoring, meal planning with carbohydrate controlled diet not exercising also  No tendency to hypoglycemia with Amaryl despite his renal dysfunction  Continue the same regimen  RENAL dysfunction: His creatinine is generally stable and 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    Elayne Snare 02/09/2022, 9:50 AM

## 2022-02-09 NOTE — Patient Instructions (Addendum)
Check on Farxiga with [facility] Dr  Check blood sugars on waking up 3 days a week  Also check blood sugars about 2 hours after meals and do this after different meals by rotation  Recommended blood sugar levels on waking up are 90-130 and about 2 hours after meal is 130-160  Please bring your blood sugar monitor to each visit, thank you

## 2022-02-12 ENCOUNTER — Other Ambulatory Visit: Payer: Self-pay | Admitting: Internal Medicine

## 2022-02-15 ENCOUNTER — Encounter: Payer: Self-pay | Admitting: Internal Medicine

## 2022-02-15 NOTE — Progress Notes (Signed)
Outside notes received. Information abstracted. Notes sent to scan.  

## 2022-02-27 ENCOUNTER — Encounter: Payer: Self-pay | Admitting: Pulmonary Disease

## 2022-02-27 ENCOUNTER — Ambulatory Visit: Payer: Medicare PPO | Admitting: Pulmonary Disease

## 2022-02-27 VITALS — BP 134/84 | HR 65 | Ht 67.5 in | Wt 221.4 lb

## 2022-02-27 DIAGNOSIS — J42 Unspecified chronic bronchitis: Secondary | ICD-10-CM

## 2022-02-27 NOTE — Progress Notes (Signed)
Subjective:   PATIENT ID: Noah Barnes GENDER: male DOB: 1946/05/25, MRN: [id number]   HPI  Chief Complaint  Patient presents with   Follow-up    Doing better last few days    Reason for Visit: Follow-up  [name] is a 76 year old male former smoker with HTN, DM2, hx DVT/PE in 2020 who presents for follow-up.  Synopsis: He reports many years of chest congestion worsened with cold weather. He has shortness of breath and congested cough. Denies wheezing. No limitation in activity and perform house work. He has been on Advair twice a day for three months and has made minimal improvement. Mucinex is ineffective.  On review of EMR he was seen by his PCP Dr Noah Barnes for acute visit for right chest tightness/soreness associated with cough. He was treated with augmentin for possible pneumonia. His symptoms have persistent since then for >2 months and are worse than his baseline symptoms.   11/03/21 Since our last visit he had Spiriva added to his Advair and rx flutter valve. He has been compliant with his inhalers. Uses nebulizer once a day. Has not used flutter valve. Feels his current regimen is working for him. Cold weather usually triggers him and causes chest tightening. With warmer weather he symptoms improved. Denies cough. Shortness of breath with exertion. No current chest tightness.  02/27/22 He is compliant with Advair and Spiriva. His breathing has been better this week due to warm weather. Fall and winter is his worst symptom as cold weather triggers and changes in humidity. Denies shortness of breath, cough or wheezing. He was last treated for exacerbation in early Aug 2023 with antibiotics, no steroids.  Social History: Former smoker ~ 7 pack years   Past Medical History:  Diagnosis Date   CHF (congestive heart failure) (HCC)    Chronic diastolic heart failure (HCC)    Constipation    Diabetes mellitus    Diverticulitis    Diverticulosis    ED (erectile  dysfunction)    Gout    Heart murmur    Hyperlipidemia    Hypertension    IBS (irritable bowel syndrome)    Renal calculus    Renal insufficiency    Tubular adenoma of colon      Family History  Problem Relation Age of Onset   Heart attack Mother 26   Hypertension Mother    Prostate cancer Father 88   Diabetes Father    Irritable bowel syndrome Father    Irritable bowel syndrome Sister        x 2   Colon cancer Brother        dx in his late 18's   Heart disease Brother    Irritable bowel syndrome Brother    Stroke Neg Hx    Esophageal cancer Neg Hx    Rectal cancer Neg Hx    Stomach cancer Neg Hx      Social History   Occupational History   Occupation: Metallurgist: [employer]    Comment: [phone]  Tobacco Use   Smoking status: Former    Packs/day: 0.30    Years: 23.00    Total pack years: 6.90    Types: Cigarettes    Quit date: 07/04/1991    Years since quitting: 30.6   Smokeless tobacco: Never   Tobacco comments:    smoked age [age], up to 1/3 ppd; may be less  Vaping Use  Vaping Use: Never used  Substance and Sexual Activity   Alcohol use: Yes    Alcohol/week: 2.0 standard drinks of alcohol    Types: 2 Glasses of wine per week    Comment: rare   Drug use: No   Sexual activity: Yes    Allergies  Allergen Reactions   Ace Inhibitors Other (See Comments)    Severe AKI due to ARB + dehydration Aug 2016   Angiotensin Receptor Blockers Other (See Comments)    Severe AKI due to ARB + dehydration Aug 2016     Outpatient Medications Prior to Visit  Medication Sig Dispense Refill   Accu-Chek Softclix Lancets lancets CHECK BLOOD SUGAR TWICE DAILY AS DIRECTED 100 each 2   albuterol (PROVENTIL) (2.5 MG/3ML) 0.083% nebulizer solution Take 3 mLs (2.5 mg total) by nebulization every 6 (six) hours as needed for wheezing or shortness of breath. 75 mL 5   albuterol (VENTOLIN HFA) 108 (90 Base) MCG/ACT inhaler Inhale 2 puffs into  the lungs every 6 (six) hours as needed for wheezing or shortness of breath. 8 g 0   allopurinol (ZYLOPRIM) 300 MG tablet Take 1 tablet by mouth once daily 90 tablet 0   apixaban (ELIQUIS) 2.5 MG TABS tablet Take 1 tablet (2.5 mg total) by mouth 2 (two) times daily. 180 tablet 2   atorvastatin (LIPITOR) 20 MG tablet Take 1 tablet by mouth once daily 90 tablet 0   Blood Glucose Monitoring Suppl (ACCU-CHEK GUIDE ME) w/Device KIT 1 each by Does not apply route in the morning and at bedtime. Use Accu Chek Guide me device to check blood sugar twice daily. 1 kit 0   FERREX 150 150 MG capsule Take 1 capsule by mouth once daily 90 capsule 0   fluticasone-salmeterol (ADVAIR DISKUS) 100-50 MCG/ACT AEPB Inhale 1 puff into the lungs 2 (two) times daily. 60 each 5   furosemide (LASIX) 40 MG tablet Take 40 mg by mouth daily.     glimepiride (AMARYL) 2 MG tablet TAKE 1 TABLET BY MOUTH ONCE DAILY BEFORE SUPPER 90 tablet 1   glucose blood (ACCU-CHEK GUIDE) test strip USE AS DIRECTED TO TEST BLOOD SUGAR TWICE DAILY 100 strip 3   hydrALAZINE (APRESOLINE) 50 MG tablet Take 1 tablet (50 mg total) by mouth 2 (two) times daily. Follow-up appt due in July must see provider for future refills 180 tablet 0   LINZESS 290 MCG CAPS capsule TAKE 1 CAPSULE BY MOUTH ONCE DAILY BEFORE BREAKFAST 90 capsule 0   metoprolol tartrate (LOPRESSOR) 100 MG tablet Take 1 tablet by mouth twice daily 180 tablet 0   Semaglutide (RYBELSUS) 14 MG TABS TAKE 1 TABLET BY MOUTH IN THE MORNING 30 MINUTES BEFORE BREAKFAST WITH A SIP OF WATER 30 tablet 3   sildenafil (VIAGRA) 100 MG tablet TAKE 1 TABLET BY MOUTH EVERY DAY AS NEEDED FOR ERECTILE DYSFUNCTION 6 tablet 1   tamsulosin (FLOMAX) 0.4 MG CAPS capsule TAKE 1 CAPSULE BY MOUTH ONCE DAILY AFTER SUPPER 90 capsule 0   Tiotropium Bromide Monohydrate (SPIRIVA RESPIMAT) 2.5 MCG/ACT AERS Inhale 2 puffs into the lungs daily. 4 g 5   No facility-administered medications prior to visit.    Review of  Systems  Constitutional:  Negative for chills, diaphoresis, fever, malaise/fatigue and weight loss.  HENT:  Negative for congestion.   Respiratory:  Negative for cough, hemoptysis, sputum production, shortness of breath and wheezing.   Cardiovascular:  Negative for chest pain, palpitations and leg swelling.     Objective:  Vitals:   02/27/22 1401  BP: 134/84  Pulse: 65  SpO2: 98%  Weight: 221 lb 6.4 oz (100.4 kg)  Height: 5' 7.5" (1.715 m)   SpO2: 98 % O2 Device: None (Room air)  Physical Exam: General: Well-appearing, no acute distress HENT: [place], AT Eyes: EOMI, no scleral icterus Respiratory: Clear to auscultation bilaterally.  No crackles, wheezing or rales Cardiovascular: RRR, -M/R/G, no JVD Extremities:-Edema,-tenderness Neuro: AAO x4, CNII-XII grossly intact Psych: Normal mood, normal affect  Data Reviewed:  Imaging: CXR 07/26/21 - No active cardiopulmonary disease. No infiltrate effusion or edema CXR 01/31/22 - No active cardiopulmonary disease  PFT: 11/03/21 FVC 2.78 (83%) FEV1 2.35 (95%) Ratio 84  TLC 81% DLCO 80% Interpretation: No obstructive or restrictive defect. Normal lung volumes and normal DLCO.  Labs: CBC    Component Value Date/Time   WBC 3.7 (L) 02/08/2022 0821   RBC 4.77 02/08/2022 0821   HGB 12.9 (L) 02/08/2022 0821   HGB 13.5 09/26/2021 1301   HCT 40.3 02/08/2022 0821   PLT 170.0 02/08/2022 0821   PLT 222 09/26/2021 1301   MCV 84.4 02/08/2022 0821   MCH 27.1 09/26/2021 1301   MCHC 32.1 02/08/2022 0821   RDW 14.9 02/08/2022 0821   LYMPHSABS 1.2 02/08/2022 0821   MONOABS 0.4 02/08/2022 0821   EOSABS 0.1 02/08/2022 0821   BASOSABS 0.0 02/08/2022 0821   Absolute eos 03/25/21 - 100     Assessment & Plan:   Discussion: 76 year old male former smoker with HTN, DM2, hx DVT/PE in 2020 who presents for follow-up.  Last exacerbation 01/2022. Currently stable on triple therapy. If symptoms worsen in the winter time we can increase ICS/LABA and/or  nebulizer use for symptom control. Discussed clinical course and management including bronchodilator regimen and action plan for exacerbation.  Asthmatic bronchitis Chronic bronchitis --CONTINUE Advair 100-50 mcg ONE puff TWICE a day --CONTINUE Spiriva 2.5 mcg TWO puffs TWICE a day --CONTINUE albuterol nebulizer AS NEEDED for shortness of breath or wheezing --Use flutter valve as needed for chest congestion --Discussed vaccinations including influenza, COVID and RSV. Appropriate to receive all.  Action Plan Increase Albuterol for worsening shortness of breath, wheezing and cough. If you symptoms do not improve in 24-48 hours, please our office for evaluation and/or prednisone taper and/or antibiotics.   Health Maintenance Immunization History  Administered Date(s) Administered   Fluad Quad(high Dose 65+) 03/06/2019, 05/05/2020, 03/26/2021   Influenza, High Dose Seasonal PF 07/12/2016, 04/10/2017, 03/22/2018   PFIZER Comirnaty(Gray Top)Covid-19 Tri-Sucrose Vaccine 01/14/2021   PFIZER(Purple Top)SARS-COV-2 Vaccination 08/13/2019, 09/10/2019, 04/06/2020   Pfizer Covid-19 Vaccine Bivalent Booster 74yr & up 06/08/2021   Pneumococcal Conjugate-13 01/06/2019   Pneumococcal Polysaccharide-23 03/01/2020   CT Lung Screen - not qualified. Insufficient tobacco history  No orders of the defined types were placed in this encounter.  No orders of the defined types were placed in this encounter.   Return in about 3 months (around 05/30/2022).  I have spent a total time of 30-minutes on the day of the appointment including chart review, data review, collecting history, coordinating care and discussing medical diagnosis and plan with the patient/family. Past medical history, allergies, medications were reviewed. Pertinent imaging, labs and tests included in this note have been reviewed and interpreted independently by me.  CVarina MD [facility] 02/27/2022 2:18 PM   Office Number [phone]

## 2022-02-27 NOTE — Patient Instructions (Addendum)
Asthmatic bronchitis Chronic bronchitis --CONTINUE Advair 100-50 mcg ONE puff TWICE a day --CONTINUE Spiriva 2.5 mcg TWO puffs TWICE a day --CONTINUE albuterol nebulizer AS NEEDED for shortness of breath or wheezing --Use flutter valve as needed for chest congestion --Discussed vaccinations including influenza, COVID and RSV. Appropriate to receive all.  Action Plan Increase Albuterol for worsening shortness of breath, wheezing and cough. If you symptoms do not improve in 24-48 hours, please our office for evaluation and/or prednisone taper and/or antibiotics.  Follow-up with me in 3 months

## 2022-03-04 ENCOUNTER — Encounter: Payer: Self-pay | Admitting: Pulmonary Disease

## 2022-03-28 ENCOUNTER — Inpatient Hospital Stay: Payer: Medicare PPO | Admitting: Family

## 2022-03-28 ENCOUNTER — Inpatient Hospital Stay: Payer: Medicare PPO | Attending: Hematology & Oncology

## 2022-03-28 ENCOUNTER — Encounter: Payer: Self-pay | Admitting: Family

## 2022-03-28 ENCOUNTER — Telehealth: Payer: Self-pay | Admitting: Pulmonary Disease

## 2022-03-28 VITALS — BP 119/73 | HR 67 | Temp 98.1°F | Resp 17 | Wt 215.1 lb

## 2022-03-28 DIAGNOSIS — Z86711 Personal history of pulmonary embolism: Secondary | ICD-10-CM | POA: Diagnosis not present

## 2022-03-28 DIAGNOSIS — Z7901 Long term (current) use of anticoagulants: Secondary | ICD-10-CM | POA: Diagnosis not present

## 2022-03-28 DIAGNOSIS — Z86718 Personal history of other venous thrombosis and embolism: Secondary | ICD-10-CM | POA: Diagnosis not present

## 2022-03-28 LAB — CBC WITH DIFFERENTIAL (CANCER CENTER ONLY)
Abs Immature Granulocytes: 0.02 10*3/uL (ref 0.00–0.07)
Basophils Absolute: 0.1 10*3/uL (ref 0.0–0.1)
Basophils Relative: 1 %
Eosinophils Absolute: 0.2 10*3/uL (ref 0.0–0.5)
Eosinophils Relative: 4 %
HCT: 42.4 % (ref 39.0–52.0)
Hemoglobin: 13.3 g/dL (ref 13.0–17.0)
Immature Granulocytes: 0 %
Lymphocytes Relative: 23 %
Lymphs Abs: 1.1 10*3/uL (ref 0.7–4.0)
MCH: 26.8 pg (ref 26.0–34.0)
MCHC: 31.4 g/dL (ref 30.0–36.0)
MCV: 85.5 fL (ref 80.0–100.0)
Monocytes Absolute: 0.5 10*3/uL (ref 0.1–1.0)
Monocytes Relative: 10 %
Neutro Abs: 3 10*3/uL (ref 1.7–7.7)
Neutrophils Relative %: 62 %
Platelet Count: 200 10*3/uL (ref 150–400)
RBC: 4.96 MIL/uL (ref 4.22–5.81)
RDW: 14.3 % (ref 11.5–15.5)
WBC Count: 4.8 10*3/uL (ref 4.0–10.5)
nRBC: 0 % (ref 0.0–0.2)

## 2022-03-28 LAB — CMP (CANCER CENTER ONLY)
ALT: 43 U/L (ref 0–44)
AST: 18 U/L (ref 15–41)
Albumin: 4.2 g/dL (ref 3.5–5.0)
Alkaline Phosphatase: 69 U/L (ref 38–126)
Anion gap: 7 (ref 5–15)
BUN: 37 mg/dL — ABNORMAL HIGH (ref 8–23)
CO2: 20 mmol/L — ABNORMAL LOW (ref 22–32)
Calcium: 9.5 mg/dL (ref 8.9–10.3)
Chloride: 111 mmol/L (ref 98–111)
Creatinine: 1.92 mg/dL — ABNORMAL HIGH (ref 0.61–1.24)
GFR, Estimated: 36 mL/min — ABNORMAL LOW (ref >60)
Glucose, Bld: 114 mg/dL — ABNORMAL HIGH (ref 70–99)
Potassium: 4.2 mmol/L (ref 3.5–5.1)
Sodium: 138 mmol/L (ref 135–145)
Total Bilirubin: 0.6 mg/dL (ref 0.3–1.2)
Total Protein: 6.9 g/dL (ref 6.5–8.1)

## 2022-03-28 MED ORDER — PREDNISONE 20 MG PO TABS: 40.0000 mg | ORAL_TABLET | Freq: Every day | ORAL | 0 refills | Status: AC

## 2022-03-28 NOTE — Telephone Encounter (Signed)
Spoke with pt. He c/o chest congestion, non prod cough and some post nasal drip for 4 days now. Denies Fever, chills , sweats or sorethroat. Pt is still taking Spiriva and Advair. Has used Albuterol inh occasionally. PT was offered and appt but declined. Pt would like something called in. Please advise.

## 2022-03-28 NOTE — Progress Notes (Signed)
Hematology and Oncology Follow Up Visit  Noah Barnes [id number] 09/12/1945 76 y.o. 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 is here today for follow-up. He is doing quite well and has no complaints at this time. He is doing well on Eliquis 2.5 mg PO BID. No issues with blood loss. No abnormal bruising, no petechiae.  No fever, chills, n/v, cough, rash, dizziness, SOB, chest pain, palpitations, abdominal pain or changes in bowel or bladder habits.  No swelling, tenderness, numbness or tingling in his extremities at this time. He has occasional puffiness in his ankles that comes and goes.  No falls or syncope reported.  Appetite and hydration are good. Weight is stable at 215 lbs.   ECOG Performance Status: 1 - Symptomatic but completely ambulatory  Medications:  Allergies as of 03/28/2022       Reactions   Ace Inhibitors Other (See Comments)   Severe AKI due to ARB + dehydration Aug 2016   Angiotensin Receptor Blockers Other (See Comments)   Severe AKI due to ARB + dehydration Aug 2016        Medication List        Accurate as of March 28, 2022 10:12 AM. If you have any questions, ask your nurse or doctor.          Accu-Chek Guide Me w/Device Kit 1 each by Does not apply route in the morning and at bedtime. Use Accu Chek Guide me device to check blood sugar twice daily.   Accu-Chek Guide test strip Generic drug: glucose blood USE AS DIRECTED TO TEST BLOOD SUGAR TWICE DAILY   Accu-Chek Softclix Lancets lancets CHECK BLOOD SUGAR TWICE DAILY AS DIRECTED   albuterol 108 (90 Base) MCG/ACT inhaler Commonly known as: VENTOLIN HFA Inhale 2 puffs into the lungs every 6 (six) hours as needed for wheezing or shortness of breath.   albuterol (2.5 MG/3ML) 0.083% nebulizer solution Commonly known as:  PROVENTIL Take 3 mLs (2.5 mg total) by nebulization every 6 (six) hours as needed for wheezing or shortness of breath.   allopurinol 300 MG tablet Commonly known as: ZYLOPRIM Take 1 tablet by mouth once daily   apixaban 2.5 MG Tabs tablet Commonly known as: Eliquis Take 1 tablet (2.5 mg total) by mouth 2 (two) times daily.   atorvastatin 20 MG tablet Commonly known as: LIPITOR Take 1 tablet by mouth once daily   Ferrex 150 150 MG capsule Generic drug: iron polysaccharides Take 1 capsule by mouth once daily   fluticasone-salmeterol 100-50 MCG/ACT Aepb Commonly known as: Advair Diskus Inhale 1 puff into the lungs 2 (two) times daily.   furosemide 40 MG tablet Commonly known as: LASIX Take 40 mg by mouth daily.   glimepiride 2 MG tablet Commonly known as: AMARYL TAKE 1 TABLET BY MOUTH ONCE DAILY BEFORE SUPPER   hydrALAZINE 50 MG tablet Commonly known as: APRESOLINE Take 1 tablet (50 mg total) by mouth 2 (two) times daily. Follow-up appt due in July must see provider for future refills   Linzess 290 MCG Caps capsule Generic drug: linaclotide TAKE 1 CAPSULE BY MOUTH ONCE DAILY BEFORE BREAKFAST   metoprolol tartrate 100 MG tablet Commonly known as: LOPRESSOR Take 1 tablet by mouth twice daily   Rybelsus 14 MG Tabs Generic drug: Semaglutide TAKE 1 TABLET BY MOUTH IN  THE MORNING 30 MINUTES BEFORE BREAKFAST WITH A SIP OF WATER   sildenafil 100 MG tablet Commonly known as: VIAGRA TAKE 1 TABLET BY MOUTH EVERY DAY AS NEEDED FOR ERECTILE DYSFUNCTION   Spiriva Respimat 2.5 MCG/ACT Aers Generic drug: Tiotropium Bromide Monohydrate Inhale 2 puffs into the lungs daily.   tamsulosin 0.4 MG Caps capsule Commonly known as: FLOMAX TAKE 1 CAPSULE BY MOUTH ONCE DAILY AFTER SUPPER        Allergies:  Allergies  Allergen Reactions   Ace Inhibitors Other (See Comments)    Severe AKI due to ARB + dehydration Aug 2016   Angiotensin Receptor Blockers Other (See Comments)     Severe AKI due to ARB + dehydration Aug 2016    Past Medical History, Surgical history, Social history, and Family History were reviewed and updated.  Review of Systems: All other 10 point review of systems is negative.   Physical Exam:  weight is 215 lb 1.3 oz (97.6 kg). His oral temperature is 98.1 F (36.7 C). His blood pressure is 119/73 and his pulse is 67. His respiration is 17 and oxygen saturation is 98%.   Wt Readings from Last 3 Encounters:  03/28/22 215 lb 1.3 oz (97.6 kg)  02/27/22 221 lb 6.4 oz (100.4 kg)  02/09/22 220 lb 12.8 oz (100.2 kg)    Ocular: Sclerae unicteric, pupils equal, round and reactive to light Ear-nose-throat: Oropharynx clear, dentition fair Lymphatic: No cervical or supraclavicular adenopathy Lungs no rales or rhonchi, good excursion bilaterally Heart regular rate and rhythm, no murmur appreciated Abd soft, nontender, positive bowel sounds MSK no focal spinal tenderness, no joint edema Neuro: non-focal, well-oriented, appropriate affect Breasts: Deferred   Lab Results  Component Value Date   WBC 4.8 03/28/2022   HGB 13.3 03/28/2022   HCT 42.4 03/28/2022   MCV 85.5 03/28/2022   PLT 200 03/28/2022   Lab Results  Component Value Date   FERRITIN 211 03/28/2019   IRON 29 (L) 03/28/2019   TIBC 216 (L) 03/28/2019   UIBC 187 03/28/2019   IRONPCTSAT 13 (L) 03/28/2019   Lab Results  Component Value Date   RETICCTPCT 1.2 03/28/2019   RBC 4.96 03/28/2022   No results found for: "KPAFRELGTCHN", "LAMBDASER", "KAPLAMBRATIO" No results found for: "IGGSERUM", "IGA", "IGMSERUM" No results found for: "TOTALPROTELP", "ALBUMINELP", "A1GS", "A2GS", "BETS", "BETA2SER", "GAMS", "MSPIKE", "SPEI"   Chemistry      Component Value Date/Time   NA 136 02/08/2022 0821   K 3.9 02/08/2022 0821   CL 110 02/08/2022 0821   CO2 19 02/08/2022 0821   BUN 28 (H) 02/08/2022 0821   CREATININE 1.51 (H) 02/08/2022 0821   CREATININE 1.70 (H) 09/26/2021 1301       Component Value Date/Time   CALCIUM 9.0 02/08/2022 0821   ALKPHOS 80 02/08/2022 0821   AST 24 02/08/2022 0821   AST 22 09/26/2021 1301   ALT 51 02/08/2022 0821   ALT 70 (H) 09/26/2021 1301   BILITOT 0.5 02/08/2022 0821   BILITOT 0.8 09/26/2021 1301       Impression and Plan: [name] is a very pleasant 76 yo African American gentleman with 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 He completed 6 months of full anticoagulation and is now doing well on maintenance low dose Eliquis lifelong.  He continues to do well and so far there has been no evidence of recurrence.  He will stay on his same regimen with maintenance Eliquis.  Follow-up in 6 months.  Lottie Dawson, NP 9/26/202310:12 AM

## 2022-03-28 NOTE — Telephone Encounter (Signed)
Called patient. C/o upper chest congestion. Compliant with inhalers. No fevers, chills. No wheezing.  Prednisone 40 mg x 5 days If symptoms persist, recommend office visit with CXR

## 2022-03-30 ENCOUNTER — Telehealth: Payer: Self-pay | Admitting: *Deleted

## 2022-03-30 NOTE — Telephone Encounter (Signed)
Per 03/28/22 los - called and gave upcoming appointments - confirmed

## 2022-03-31 ENCOUNTER — Other Ambulatory Visit: Payer: Self-pay | Admitting: Internal Medicine

## 2022-03-31 ENCOUNTER — Other Ambulatory Visit: Payer: Self-pay | Admitting: Endocrinology

## 2022-03-31 DIAGNOSIS — E669 Obesity, unspecified: Secondary | ICD-10-CM

## 2022-04-04 ENCOUNTER — Ambulatory Visit (INDEPENDENT_AMBULATORY_CARE_PROVIDER_SITE_OTHER): Payer: Medicare PPO

## 2022-04-04 ENCOUNTER — Telehealth: Payer: Self-pay | Admitting: Pulmonary Disease

## 2022-04-04 DIAGNOSIS — Z23 Encounter for immunization: Secondary | ICD-10-CM

## 2022-04-04 MED ORDER — PREDNISONE 10 MG PO TABS: ORAL_TABLET | ORAL | 0 refills | Status: AC

## 2022-04-04 MED ORDER — FLUTICASONE-SALMETEROL 250-50 MCG/ACT IN AEPB: 1.0000 | INHALATION_SPRAY | Freq: Two times a day (BID) | RESPIRATORY_TRACT | 5 refills | Status: DC

## 2022-04-04 NOTE — Progress Notes (Signed)
Pt responded well to flu vacc in right deltoid.

## 2022-04-04 NOTE — Telephone Encounter (Signed)
Congestion still noted. Finished prednisone yesterday. Dry cough most times but some mucus production noted to just stay in his throat.   OTC medications:  Mucinex '600mg'$  twice a day  Patient states that he and Dr Loanne Drilling have talked about possible steroids or another medication. He wants to know he he needs an office visit to talk about that.   Please advise Dr Loanne Drilling

## 2022-04-04 NOTE — Telephone Encounter (Signed)
Prednisone taper partially effective however he felt course ended too early. Also wanted to discuss step up plan as we approach this winter.  --Prednisone taper ordered --INCREASED to Advair 250-50 mcg ONE puff TWICE a day --Encouraged daily nebulizer use as needed  No further action needed.

## 2022-04-05 ENCOUNTER — Other Ambulatory Visit: Payer: Self-pay | Admitting: Internal Medicine

## 2022-04-10 ENCOUNTER — Other Ambulatory Visit: Payer: Self-pay | Admitting: Internal Medicine

## 2022-04-19 ENCOUNTER — Other Ambulatory Visit (HOSPITAL_BASED_OUTPATIENT_CLINIC_OR_DEPARTMENT_OTHER): Payer: Self-pay

## 2022-04-19 MED ORDER — COMIRNATY 30 MCG/0.3ML IM SUSY
PREFILLED_SYRINGE | INTRAMUSCULAR | 0 refills | Status: DC
  Filled 2022-04-19: qty 0.3, 1d supply, fill #0

## 2022-04-21 ENCOUNTER — Other Ambulatory Visit: Payer: Self-pay | Admitting: Internal Medicine

## 2022-04-25 ENCOUNTER — Other Ambulatory Visit: Payer: Self-pay | Admitting: Internal Medicine

## 2022-05-11 ENCOUNTER — Other Ambulatory Visit: Payer: Self-pay | Admitting: Internal Medicine

## 2022-05-19 ENCOUNTER — Encounter (HOSPITAL_BASED_OUTPATIENT_CLINIC_OR_DEPARTMENT_OTHER): Payer: Self-pay | Admitting: Pulmonary Disease

## 2022-05-19 ENCOUNTER — Ambulatory Visit (HOSPITAL_BASED_OUTPATIENT_CLINIC_OR_DEPARTMENT_OTHER): Payer: Medicare PPO | Admitting: Pulmonary Disease

## 2022-05-19 VITALS — BP 140/60 | HR 67 | Ht 67.5 in | Wt 225.0 lb

## 2022-05-19 DIAGNOSIS — J42 Unspecified chronic bronchitis: Secondary | ICD-10-CM | POA: Diagnosis not present

## 2022-05-19 MED ORDER — SPIRIVA RESPIMAT 2.5 MCG/ACT IN AERS: 2.0000 | INHALATION_SPRAY | Freq: Every day | RESPIRATORY_TRACT | 5 refills | Status: DC

## 2022-05-19 NOTE — Progress Notes (Signed)
Subjective:   PATIENT ID: Noah Barnes GENDER: male DOB: [birth year], MRN: [id number]   HPI  Chief Complaint  Patient presents with   Follow-up    Feeling over all today    Reason for Visit: Follow-up  [name] is a 76 year old male former smoker with HTN, DM2, hx DVT/PE in 2020 who presents for follow-up.  Synopsis: He reports many years of chest congestion worsened with cold weather. He has shortness of breath and congested cough. Denies wheezing. No limitation in activity and perform house work. He has been on Advair twice a day for three months and has made minimal improvement. Mucinex is ineffective.  On review of EMR he was seen by his PCP Dr Quay Burow for acute visit for right chest tightness/soreness associated with cough. He was treated with augmentin for possible pneumonia. His symptoms have persistent since then for >2 months and are worse than his baseline symptoms.   11/03/21 Since our last visit he had Spiriva added to his Advair and rx flutter valve. He has been compliant with his inhalers. Uses nebulizer once a day. Has not used flutter valve. Feels his current regimen is working for him. Cold weather usually triggers him and causes chest tightening. With warmer weather he symptoms improved. Denies cough. Shortness of breath with exertion. No current chest tightness.  02/27/22 He is compliant with Advair and Spiriva. His breathing has been better this week due to warm weather. Fall and winter is his worst symptom as cold weather triggers and changes in humidity. Denies shortness of breath, cough or wheezing. He was last treated for exacerbation in early Aug 2023 with antibiotics, no steroids.  05/19/22 Since our last visit, he had outpatient exacerbation in Sept/October. He is doing well on increased dose of Advair 250 and Spiriva. Denies coughing, shortness of breath or wheezing.Late fall and winter are worst times. No limitations in activity. No dedicated  exercise  Social History: Former smoker ~ 7 pack years   Past Medical History:  Diagnosis Date   CHF (congestive heart failure) (HCC)    Chronic diastolic heart failure (HCC)    Constipation    Diabetes mellitus    Diverticulitis    Diverticulosis    ED (erectile dysfunction)    Gout    Heart murmur    Hyperlipidemia    Hypertension    IBS (irritable bowel syndrome)    Renal calculus    Renal insufficiency    Tubular adenoma of colon      Family History  Problem Relation Age of Onset   Heart attack Mother 60   Hypertension Mother    Prostate cancer Father 34   Diabetes Father    Irritable bowel syndrome Father    Irritable bowel syndrome Sister        x 2   Colon cancer Brother        dx in his late 80's   Heart disease Brother    Irritable bowel syndrome Brother    Stroke Neg Hx    Esophageal cancer Neg Hx    Rectal cancer Neg Hx    Stomach cancer Neg Hx      Social History   Occupational History   Occupation: Metallurgist: [employer]    Comment: [phone]  Tobacco Use   Smoking status: Former    Packs/day: 0.30    Years: 23.00    Total pack years: 6.90  Types: Cigarettes    Quit date: 07/04/1991    Years since quitting: 30.8   Smokeless tobacco: Never   Tobacco comments:    smoked age [age], up to 1/3 ppd; may be less  Vaping Use   Vaping Use: Never used  Substance and Sexual Activity   Alcohol use: Yes    Alcohol/week: 2.0 standard drinks of alcohol    Types: 2 Glasses of wine per week    Comment: rare   Drug use: No   Sexual activity: Yes    Allergies  Allergen Reactions   Ace Inhibitors Other (See Comments)    Severe AKI due to ARB + dehydration Aug 2016   Angiotensin Receptor Blockers Other (See Comments)    Severe AKI due to ARB + dehydration Aug 2016     Outpatient Medications Prior to Visit  Medication Sig Dispense Refill   Accu-Chek Softclix Lancets lancets CHECK BLOOD SUGAR TWICE DAILY AS  DIRECTED 100 each 2   albuterol (PROVENTIL) (2.5 MG/3ML) 0.083% nebulizer solution Take 3 mLs (2.5 mg total) by nebulization every 6 (six) hours as needed for wheezing or shortness of breath. 75 mL 5   albuterol (VENTOLIN HFA) 108 (90 Base) MCG/ACT inhaler Inhale 2 puffs into the lungs every 6 (six) hours as needed for wheezing or shortness of breath. 8 g 0   allopurinol (ZYLOPRIM) 300 MG tablet Take 1 tablet by mouth once daily 90 tablet 2   apixaban (ELIQUIS) 2.5 MG TABS tablet Take 1 tablet (2.5 mg total) by mouth 2 (two) times daily. 180 tablet 2   atorvastatin (LIPITOR) 20 MG tablet Take 1 tablet by mouth once daily 90 tablet 2   Blood Glucose Monitoring Suppl (ACCU-CHEK GUIDE ME) w/Device KIT 1 each by Does not apply route in the morning and at bedtime. Use Accu Chek Guide me device to check blood sugar twice daily. 1 kit 0   COVID-19 mRNA vaccine 2023-2024 (COMIRNATY) syringe Inject into the muscle. 0.3 mL 0   FERREX 150 150 MG capsule Take 1 capsule by mouth once daily 90 capsule 0   fluticasone-salmeterol (ADVAIR DISKUS) 250-50 MCG/ACT AEPB Inhale 1 puff into the lungs in the morning and at bedtime. 60 each 5   furosemide (LASIX) 40 MG tablet Take 40 mg by mouth daily.     glimepiride (AMARYL) 2 MG tablet TAKE 1 TABLET BY MOUTH ONCE DAILY BEFORE SUPPER 90 tablet 1   glucose blood (ACCU-CHEK GUIDE) test strip USE AS DIRECTED TO TEST BLOOD SUGAR TWICE DAILY 100 strip 3   hydrALAZINE (APRESOLINE) 50 MG tablet Take 1 tablet (50 mg total) by mouth 2 (two) times daily. 180 tablet 1   LINZESS 290 MCG CAPS capsule TAKE 1 CAPSULE BY MOUTH ONCE DAILY BEFORE BREAKFAST 90 capsule 0   metoprolol tartrate (LOPRESSOR) 100 MG tablet Take 1 tablet by mouth twice daily 180 tablet 0   RYBELSUS 14 MG TABS TAKE 1 TABLET BY MOUTH IN THE MORNING 30 MINUTES BEFORE BREAKFAST WITH  A  SIP  OF  WATER 30 tablet 1   sildenafil (VIAGRA) 100 MG tablet TAKE 1 TABLET BY MOUTH EVERY DAY AS NEEDED FOR ERECTILE DYSFUNCTION 6  tablet 1   tamsulosin (FLOMAX) 0.4 MG CAPS capsule TAKE 1 CAPSULE BY MOUTH ONCE DAILY AFTER SUPPER 90 capsule 0   Tiotropium Bromide Monohydrate (SPIRIVA RESPIMAT) 2.5 MCG/ACT AERS Inhale 2 puffs into the lungs daily. 4 g 5   No facility-administered medications prior to visit.    Review of  Systems  Constitutional:  Negative for chills, diaphoresis, fever, malaise/fatigue and weight loss.  HENT:  Negative for congestion.   Respiratory:  Negative for cough, hemoptysis, sputum production, shortness of breath and wheezing.   Cardiovascular:  Negative for chest pain, palpitations and leg swelling.     Objective:   Vitals:   05/19/22 0958  BP: (!) 140/60  Pulse: 67  SpO2: 99%  Weight: 225 lb (102.1 kg)  Height: 5' 7.5" (1.715 m)   SpO2: 99 % O2 Device: None (Room air)  Physical Exam: General: Well-appearing, no acute distress HENT: Arial, AT Eyes: EOMI, no scleral icterus Respiratory: Clear to auscultation bilaterally.  No crackles, wheezing or rales Cardiovascular: RRR, -M/R/G, no JVD Extremities:-Edema,-tenderness Neuro: AAO x4, CNII-XII grossly intact Psych: Normal mood, normal affect  Data Reviewed:  Imaging: CXR 07/26/21 - No active cardiopulmonary disease. No infiltrate effusion or edema CXR 01/31/22 - No active cardiopulmonary disease  PFT: 11/03/21 FVC 2.78 (83%) FEV1 2.35 (95%) Ratio 84  TLC 81% DLCO 80% Interpretation: No obstructive or restrictive defect. Normal lung volumes and normal DLCO.  Labs: CBC    Component Value Date/Time   WBC 4.8 03/28/2022 0946   WBC 3.7 (L) 02/08/2022 0821   RBC 4.96 03/28/2022 0946   HGB 13.3 03/28/2022 0946   HCT 42.4 03/28/2022 0946   PLT 200 03/28/2022 0946   MCV 85.5 03/28/2022 0946   MCH 26.8 03/28/2022 0946   MCHC 31.4 03/28/2022 0946   RDW 14.3 03/28/2022 0946   LYMPHSABS 1.1 03/28/2022 0946   MONOABS 0.5 03/28/2022 0946   EOSABS 0.2 03/28/2022 0946   BASOSABS 0.1 03/28/2022 0946   Absolute eos 03/25/21 - 100      Assessment & Plan:   Discussion: 76 year old male former smoker with HTN, DM2, hx DVT/PE in 2020 who presents for follow-up. Last exacerbation was Aug and Sept/Oct 2023. Currently asymptomatic and well-controlled on triple therapy. Discussed clinical course and management of his bronchitis including bronchodilator regimen and action plan for exacerbation.  Asthmatic bronchitis Chronic bronchitis --CONTINUE Advair 250-50 mcg ONE puff TWICE a day --CONTINUE Spiriva 2.5 mcg TWO puffs TWICE a day --CONTINUE albuterol nebulizer AS NEEDED for shortness of breath or wheezing --Use flutter valve as needed for chest congestion --Discussed vaccinations including RSV. Up to date on remaining vaccines  Action Plan Increase Albuterol for worsening shortness of breath, wheezing and cough. If you symptoms do not improve in 24-48 hours, please our office for evaluation and/or prednisone taper and/or antibiotics.   Health Maintenance Immunization History  Administered Date(s) Administered   COVID-19, mRNA, vaccine(Comirnaty)12 years and older 04/19/2022   Fluad Quad(high Dose 65+) 03/06/2019, 05/05/2020, 03/26/2021, 04/04/2022   Influenza, High Dose Seasonal PF 07/12/2016, 04/10/2017, 03/22/2018   PFIZER Comirnaty(Gray Top)Covid-19 Tri-Sucrose Vaccine 01/14/2021   PFIZER(Purple Top)SARS-COV-2 Vaccination 08/13/2019, 09/10/2019, 04/06/2020   Pfizer Covid-19 Vaccine Bivalent Booster 61yr & up 06/08/2021   Pneumococcal Conjugate-13 01/06/2019   Pneumococcal Polysaccharide-23 03/01/2020   CT Lung Screen - not qualified. Insufficient tobacco history  No orders of the defined types were placed in this encounter.  Meds ordered this encounter  Medications   Tiotropium Bromide Monohydrate (SPIRIVA RESPIMAT) 2.5 MCG/ACT AERS    Sig: Inhale 2 puffs into the lungs daily.    Dispense:  4 g    Refill:  5    Please send all further refill requests electronically only.    Return in about 3 months (around  08/19/2022).  I have spent a total time of 30-minutes  on the day of the appointment including chart review, data review, collecting history, coordinating care and discussing medical diagnosis and plan with the patient/family. Past medical history, allergies, medications were reviewed. Pertinent imaging, labs and tests included in this note have been reviewed and interpreted independently by me.  Green, MD [facility] 05/19/2022 10:16 AM  Office Number [phone]

## 2022-05-19 NOTE — Patient Instructions (Addendum)
  Asthmatic bronchitis Chronic bronchitis --CONTINUE Advair 250-50 mcg ONE puff TWICE a day --CONTINUE Spiriva 2.5 mcg TWO puffs TWICE a day --CONTINUE albuterol nebulizer AS NEEDED for shortness of breath or wheezing --Use flutter valve as needed for chest congestion --Discussed vaccinations including RSV. Pharmacy downstairs can administer --Up to date on remaining vaccines  Action Plan Increase Albuterol for worsening shortness of breath, wheezing and cough. If you symptoms do not improve in 24-48 hours, please our office for evaluation and/or prednisone taper and/or antibiotics.  Follow-up with me in 3 months

## 2022-05-23 ENCOUNTER — Telehealth: Payer: Self-pay | Admitting: Nurse Practitioner

## 2022-05-23 ENCOUNTER — Encounter (HOSPITAL_COMMUNITY): Payer: Self-pay | Admitting: *Deleted

## 2022-05-23 ENCOUNTER — Ambulatory Visit (HOSPITAL_COMMUNITY)
Admission: EM | Admit: 2022-05-23 | Discharge: 2022-05-23 | Disposition: A | Discharge status: Home / Self Care | Payer: Medicare PPO | Attending: Physician Assistant | Admitting: Physician Assistant

## 2022-05-23 DIAGNOSIS — K5900 Constipation, unspecified: Secondary | ICD-10-CM | POA: Diagnosis not present

## 2022-05-23 MED ORDER — PEG 3350-KCL-NA BICARB-NACL 420 G PO SOLR: 4000.0000 mL | Freq: Once | ORAL | 0 refills | Status: AC

## 2022-05-23 NOTE — Telephone Encounter (Signed)
Patient is calling again states it is important that someone calls him back. Please advise

## 2022-05-23 NOTE — Telephone Encounter (Signed)
Inbound call from patient stating that he is having lower abdominal pain and is having issues with using the bathroom. Patient is requesting a call back to discuss. Please advise.

## 2022-05-23 NOTE — Telephone Encounter (Signed)
Spoke with pt. Pt reports left lower abdominal pain and constipation. Pt states he passed a very small amount of stool today. Let pt know about miralax purge and pt stated this would not help him. Pt states last time he had to use a colonoscopy prep and that helped some. Pt also reports he takes linzess everyday. Pt scheduled for soonest available appointment on 12/14 at 1:30 pm  with [name] NP.  Pt was concerned that this was too far out, let pt know that I would also send a message to [place] but he could go to an urgent care if needed. Pt stated that the doctor at an urgent care won't know his history and did not want to go to urgent care.

## 2022-05-23 NOTE — Discharge Instructions (Signed)
Advised to use the [facility] and follow the directions this does have some different flavor products that she can use to make it more palatable or tasting. Advised to increase fluid intake and hopefully this will help relieve the discomfort. Advised to follow-up with your GI specialist or return to urgent care if symptoms fail to improve.

## 2022-05-23 NOTE — ED Triage Notes (Signed)
C/O constipation x 2 days with tenderness to LLQ. Denies any fevers, n/v. States constipation is an ongoing issue; has a gastroenterologist but has difficulty getting appts. States OTC laxatives do not work for him, and usually needs a Rx for colonoscopy prep solution.

## 2022-05-23 NOTE — ED Provider Notes (Signed)
Noah Barnes    CSN: [id number] Arrival date & time: 05/23/22  1153      History   Chief Complaint Chief Complaint  Patient presents with   Constipation    HPI Noah Barnes is a 76 y.o. male.   76 year old male presents with constipation.  Patient indicates that he has a history of having severe constipation intermittently.  He does have a GI specialist that he goes to on a regular basis however he could not get appointment to be seen.  Patient indicates for the past couple days he has been having constipation, feeling bloated, and has not been able to have a full bowel movement.  Patient indicates that he is taking LinZess for his constipation but still he continues to have problematic episodes of constipation occur.  Patient indicates when he does have this occur his GI specialist will usually give him a colon prep kit to help him have a bowel movement because normal OTC bowel preparations do not work.  Patient indicates he has not have any fever, chills, nausea or vomiting.  Patient denies any blood in the bowels or black tarry bowels.  Patient has had some mild left lower quadrant pain and discomfort with the constipation over the past 1 to 2 days.  Patient indicates he does not have a history of having diverticulitis in the past.  He does indicate that he ate some nuts and corn a couple days before the left lower quadrant discomfort started.  He indicates that it is mild.  He is tolerating fluids well.  He is requesting to have a colon prep in order to allow him to have a good bowel movement.   Constipation   Past Medical History:  Diagnosis Date   CHF (congestive heart failure) (HCC)    Chronic diastolic heart failure (HCC)    Constipation    Diabetes mellitus    Diverticulitis    Diverticulosis    ED (erectile dysfunction)    Gout    Heart murmur    Hyperlipidemia    Hypertension    IBS (irritable bowel syndrome)    Renal calculus    Renal insufficiency     Tubular adenoma of colon     Patient Active Problem List   Diagnosis Date Noted   Acute cough 04/19/2021   Right-sided chest pain 04/19/2021   Chest congestion 04/15/2021   Acquired trigger finger of right little finger 12/30/2020   Bilateral primary osteoarthritis of knee 12/30/2020   Aortic atherosclerosis ([place]) 12/29/2020   Chronic bronchitis ([place]) 07/28/2020   BPH (benign prostatic hyperplasia) 07/05/2020   Left lower quadrant abdominal pain 11/28/2019   Lung nodule - stable 04/04/2019   History of DVT of lower extremity, b/l 04/03/2019   History of pulmonary embolus (PE) 04/03/2019   Hypomagnesemia 04/03/2019   Nephrolithiasis 03/06/2019   Seasonal allergic rhinitis 05/22/2018   MR (mitral regurgitation) 12/20/2017   CKD (chronic kidney disease) stage 3, GFR 30-59 ml/min ([place]) 08/14/2017   Abdominal hernia without obstruction and without gangrene 01/10/2016   Chronic diastolic heart failure ([place]) 02/25/2015   Right renal mass 02/25/2015   Other constipation 02/22/2015   Chronic venous insufficiency 01/18/2015   Superficial phlebitis 01/18/2015   IBS (irritable bowel syndrome) 11/12/2013   Family history of prostate cancer 12/26/2011   Diabetes mellitus with renal complications ([place]) 80/22/3361   Gout 12/31/2007   HYPERLIPIDEMIA 08/14/2007   ERECTILE DYSFUNCTION 05/09/2007   Essential hypertension 11/28/2006    Past Surgical History:  Procedure Laterality Date   COLONOSCOPY  1998   negative; no F/U   EXTRACORPOREAL SHOCK WAVE LITHOTRIPSY Left 03/20/2019   Procedure: EXTRACORPOREAL SHOCK WAVE LITHOTRIPSY (ESWL);  Surgeon: Cleon Gustin, MD;  Location: [facility];  Service: Urology;  Laterality: Left;   knee effusion tapped         Home Medications    Prior to Admission medications   Medication Sig Start Date End Date Taking? Authorizing Provider  allopurinol (ZYLOPRIM) 300 MG tablet Take 1 tablet by mouth once daily 04/21/22  Yes Burns, Claudina Lick, MD   apixaban (ELIQUIS) 2.5 MG TABS tablet Take 1 tablet (2.5 mg total) by mouth 2 (two) times daily. 09/19/21  Yes Celso Amy, NP  atorvastatin (LIPITOR) 20 MG tablet Take 1 tablet by mouth once daily 04/21/22  Yes Burns, Claudina Lick, MD  FERREX 150 150 MG capsule Take 1 capsule by mouth once daily 04/05/22  Yes Burns, Claudina Lick, MD  fluticasone-salmeterol (ADVAIR DISKUS) 250-50 MCG/ACT AEPB Inhale 1 puff into the lungs in the morning and at bedtime. 04/04/22  Yes Margaretha Seeds, MD  furosemide (LASIX) 40 MG tablet Take 40 mg by mouth daily. 07/18/19  Yes [provider]  glimepiride (AMARYL) 2 MG tablet TAKE 1 TABLET BY MOUTH ONCE DAILY BEFORE SUPPER 01/04/22  Yes Elayne Snare, MD  hydrALAZINE (APRESOLINE) 50 MG tablet Take 1 tablet (50 mg total) by mouth 2 (two) times daily. 04/02/22  Yes Burns, Claudina Lick, MD  LINZESS 290 MCG CAPS capsule TAKE 1 CAPSULE BY MOUTH ONCE DAILY BEFORE BREAKFAST 05/11/22  Yes Pyrtle, Lajuan Lines, MD  metoprolol tartrate (LOPRESSOR) 100 MG tablet Take 1 tablet by mouth twice daily 04/10/22  Yes Burns, Claudina Lick, MD  polyethylene glycol-electrolytes (NULYTELY) 420 g solution Take 4,000 mLs by mouth once for 1 dose. 05/23/22 05/23/22 Yes Nyoka Lint, PA-C  RYBELSUS 14 MG TABS TAKE 1 TABLET BY MOUTH IN THE MORNING 30 MINUTES BEFORE BREAKFAST WITH  A  SIP  OF  WATER 03/31/22  Yes Elayne Snare, MD  tamsulosin (FLOMAX) 0.4 MG CAPS capsule TAKE 1 CAPSULE BY MOUTH ONCE DAILY AFTER SUPPER 05/11/22  Yes Burns, Claudina Lick, MD  Tiotropium Bromide Monohydrate (SPIRIVA RESPIMAT) 2.5 MCG/ACT AERS Inhale 2 puffs into the lungs daily. 05/19/22  Yes Margaretha Seeds, MD  Accu-Chek Softclix Lancets lancets CHECK BLOOD SUGAR TWICE DAILY AS DIRECTED 01/04/22   Elayne Snare, MD  albuterol (PROVENTIL) (2.5 MG/3ML) 0.083% nebulizer solution Take 3 mLs (2.5 mg total) by nebulization every 6 (six) hours as needed for wheezing or shortness of breath. 09/20/21   Margaretha Seeds, MD  albuterol (VENTOLIN HFA) 108 (90  Base) MCG/ACT inhaler Inhale 2 puffs into the lungs every 6 (six) hours as needed for wheezing or shortness of breath. 04/15/21   Michela Pitcher, NP  Blood Glucose Monitoring Suppl (ACCU-CHEK GUIDE ME) w/Device KIT 1 each by Does not apply route in the morning and at bedtime. Use Accu Chek Guide me device to check blood sugar twice daily. 10/05/21   Elayne Snare, MD  COVID-19 mRNA vaccine [phone] (COMIRNATY) syringe Inject into the muscle. 04/19/22     glucose blood (ACCU-CHEK GUIDE) test strip USE AS DIRECTED TO TEST BLOOD SUGAR TWICE DAILY 09/15/21   Elayne Snare, MD  sildenafil (VIAGRA) 100 MG tablet TAKE 1 TABLET BY MOUTH EVERY DAY AS NEEDED FOR ERECTILE DYSFUNCTION 04/25/22   Binnie Rail, MD    Family History Family History  Problem Relation Age  of Onset   Heart attack Mother 22   Hypertension Mother    Prostate cancer Father 47   Diabetes Father    Irritable bowel syndrome Father    Irritable bowel syndrome Sister        x 2   Colon cancer Brother        dx in his late 31's   Heart disease Brother    Irritable bowel syndrome Brother    Stroke Neg Hx    Esophageal cancer Neg Hx    Rectal cancer Neg Hx    Stomach cancer Neg Hx     Social History Social History   Tobacco Use   Smoking status: Former    Packs/day: 0.30    Years: 23.00    Total pack years: 6.90    Types: Cigarettes    Quit date: 07/04/1991    Years since quitting: 30.9   Smokeless tobacco: Never   Tobacco comments:    smoked age [age], up to 1/3 ppd; may be less  Vaping Use   Vaping Use: Never used  Substance Use Topics   Alcohol use: Not Currently    Comment: rarely   Drug use: No     Allergies   Ace inhibitors and Angiotensin receptor blockers   Review of Systems Review of Systems  Gastrointestinal:  Positive for constipation.     Physical Exam Triage Vital Signs ED Triage Vitals  Enc Vitals Group     BP 05/23/22 1249 (!) 147/91     Pulse Rate 05/23/22 1249 62     Resp 05/23/22 1249  16     Temp 05/23/22 1249 98 F (36.7 C)     Temp Source 05/23/22 1249 Oral     SpO2 05/23/22 1249 95 %     Weight --      Height --      Head Circumference --      Peak Flow --      Pain Score 05/23/22 1251 4     Pain Loc --      Pain Edu? --      Excl. in [place]? --    No data found.  Updated Vital Signs BP (!) 147/91   Pulse 62   Temp 98 F (36.7 C) (Oral)   Resp 16   SpO2 95%   Visual Acuity Right Eye Distance:   Left Eye Distance:   Bilateral Distance:    Right Eye Near:   Left Eye Near:    Bilateral Near:     Physical Exam Constitutional:      Appearance: Normal appearance.  Abdominal:     General: Abdomen is flat. Bowel sounds are normal.     Palpations: Abdomen is soft.     Tenderness: There is abdominal tenderness (mild) in the left lower quadrant. There is no guarding or rebound.    Neurological:     Mental Status: He is alert.      UC Treatments / Results  Labs (all labs ordered are listed, but only abnormal results are displayed) Labs Reviewed - No data to display  EKG   Radiology No results found.  Procedures Procedures (including critical care time)  Medications Ordered in UC Medications - No data to display  Initial Impression / Assessment and Plan / UC Course  I have reviewed the triage vital signs and the nursing notes.  Pertinent labs & imaging results that were available during my care of the patient were reviewed by me and  considered in my medical decision making (see chart for details).    Plan: 1.  The constipation will be treated with the following: A.  New lightly given and instructed patient to follow directions to help relieve constipation. 2.  Patient advised to follow-up with GI specialist if constipation is not relieved and he continues to have symptoms or his discomfort increases. Final Clinical Impressions(s) / UC Diagnoses   Final diagnoses:  Constipation, unspecified constipation type     Discharge  Instructions      Advised to use the Nulytely and follow the directions this does have some different flavor products that she can use to make it more palatable or tasting. Advised to increase fluid intake and hopefully this will help relieve the discomfort. Advised to follow-up with your GI specialist or return to urgent care if symptoms fail to improve.    ED Prescriptions     Medication Sig Dispense Auth. Provider   polyethylene glycol-electrolytes (NULYTELY) 420 g solution Take 4,000 mLs by mouth once for 1 dose. 4,000 mL Nyoka Lint, PA-C      PDMP not reviewed this encounter.   Nyoka Lint, PA-C 05/23/22 1404

## 2022-05-24 NOTE — Telephone Encounter (Signed)
Rechecked schedule and called pt to let him know that there has been a cancellation on 11/27 at 9 am with Vicie Mutters, PA. Pt stated he would be able to do appt at that time. Pt scheduled for 11/27 at 9 am.

## 2022-05-24 NOTE — Telephone Encounter (Signed)
Gave pt Paula's message. Pt expressed frustration about not being able to get a sooner appointment. Let pt know that if he is in severe pain Nevin Bloodgood recommended Urgent care.

## 2022-05-27 ENCOUNTER — Encounter (HOSPITAL_BASED_OUTPATIENT_CLINIC_OR_DEPARTMENT_OTHER): Payer: Self-pay | Admitting: Pulmonary Disease

## 2022-05-29 ENCOUNTER — Other Ambulatory Visit (INDEPENDENT_AMBULATORY_CARE_PROVIDER_SITE_OTHER): Payer: Medicare PPO

## 2022-05-29 ENCOUNTER — Ambulatory Visit: Payer: Medicare PPO | Admitting: Physician Assistant

## 2022-05-29 ENCOUNTER — Encounter: Payer: Self-pay | Admitting: Physician Assistant

## 2022-05-29 VITALS — BP 140/80 | HR 75 | Ht 68.0 in | Wt 220.6 lb

## 2022-05-29 DIAGNOSIS — K5909 Other constipation: Secondary | ICD-10-CM

## 2022-05-29 DIAGNOSIS — Z8719 Personal history of other diseases of the digestive system: Secondary | ICD-10-CM

## 2022-05-29 DIAGNOSIS — Z86010 Personal history of colonic polyps: Secondary | ICD-10-CM

## 2022-05-29 LAB — CBC WITH DIFFERENTIAL/PLATELET
Basophils Absolute: 0 10*3/uL (ref 0.0–0.1)
Basophils Relative: 1.1 % (ref 0.0–3.0)
Eosinophils Absolute: 0.1 10*3/uL (ref 0.0–0.7)
Eosinophils Relative: 2.8 % (ref 0.0–5.0)
HCT: 40.9 % (ref 39.0–52.0)
Hemoglobin: 13.1 g/dL (ref 13.0–17.0)
Lymphocytes Relative: 25.2 % (ref 12.0–46.0)
Lymphs Abs: 0.8 10*3/uL (ref 0.7–4.0)
MCHC: 32.1 g/dL (ref 30.0–36.0)
MCV: 85.1 fl (ref 78.0–100.0)
Monocytes Absolute: 0.4 10*3/uL (ref 0.1–1.0)
Monocytes Relative: 13.1 % — ABNORMAL HIGH (ref 3.0–12.0)
Neutro Abs: 1.9 10*3/uL (ref 1.4–7.7)
Neutrophils Relative %: 57.8 % (ref 43.0–77.0)
Platelets: 204 10*3/uL (ref 150.0–400.0)
RBC: 4.81 Mil/uL (ref 4.22–5.81)
RDW: 14.8 % (ref 11.5–15.5)
WBC: 3.2 10*3/uL — ABNORMAL LOW (ref 4.0–10.5)

## 2022-05-29 LAB — COMPREHENSIVE METABOLIC PANEL
ALT: 39 U/L (ref 0–53)
AST: 20 U/L (ref 0–37)
Albumin: 3.9 g/dL (ref 3.5–5.2)
Alkaline Phosphatase: 65 U/L (ref 39–117)
BUN: 31 mg/dL — ABNORMAL HIGH (ref 6–23)
CO2: 20 mEq/L (ref 19–32)
Calcium: 9.3 mg/dL (ref 8.4–10.5)
Chloride: 108 mEq/L (ref 96–112)
Creatinine, Ser: 1.66 mg/dL — ABNORMAL HIGH (ref 0.40–1.50)
GFR: 40.11 mL/min — ABNORMAL LOW (ref >60.00)
Glucose, Bld: 99 mg/dL (ref 70–99)
Potassium: 3.8 mEq/L (ref 3.5–5.1)
Sodium: 138 mEq/L (ref 135–145)
Total Bilirubin: 0.5 mg/dL (ref 0.2–1.2)
Total Protein: 6.8 g/dL (ref 6.0–8.3)

## 2022-05-29 LAB — SEDIMENTATION RATE: Sed Rate: 11 mm/h (ref 0–20)

## 2022-05-29 NOTE — Progress Notes (Signed)
05/29/2022 Noah Barnes [id number] 1946-06-16  Referring provider: Binnie Rail, MD Primary GI doctor: Dr. Hilarie Fredrickson  ASSESSMENT AND PLAN:   Chronic constipation on linzess 290 mcg Possible worse due to rebylsus use in last year, discussed mechanism with patient.  Can do trial of motegerity with linzess and or adding miralax daily to linzess.  - Increase fiber/ water intake, decrease caffeine, increase activity level. - no obstructive symptoms at this time.  History of diverticulitis Pain has improved, no other symptoms to accompany it Sister just had surgery due to diverticulitis Will get labs to look for infection/inflammation, consider Ct if elevated.  Will call if any symptoms. Add on fiber supplement, avoid NSAIDS, information given  History of colonic polyps 02/2020 Colonoscopy polyp surveillance plus family history of colon cancer- moderate tics, internal hemorrhoids, otherwise normal.    Patient Care Team: Binnie Rail, MD as PCP - General (Internal Medicine) Sherren Mocha, MD as PCP - Cardiology (Cardiology) Elayne Snare, MD as Consulting Physician (Endocrinology) Joseph Art, OD as Consulting Physician (Optometry)  HISTORY OF PRESENT ILLNESS: 76 y.o. male with a past medical history of  adenomatous colon polyps, diverticulitis, chronic constipation and IBS, chronic diastolic heart failure, hyperlipidemia, diabetes, CKD, history of DVT, chronic anticoagulation and others listed below presents for evaluation of AB and constipation.   02/2020 Colonoscopy polyp surveillance plus family history of colon cancer- moderate tics, internal hemorrhoids, otherwise normal. 06/07/2021 OV [name] for constipation on linzess and periodic bowel purges and recent diverticulitis resolved with cipro/flagyl.   He was having severe pain 05/23/22, went to UC due to being unable to get appointment here which he is frustrated about having to see [employer] and not seeing Dr.  Hilarie Fredrickson.  He had severe constipation. Linzess 290 mcg was not working, given prep for colonoscopy with Nulytely.   States it is better, pain has improved. Never got antibiotics, no labs done. Took a few colon cleanse pills from health food store and felt that this helped as well.  He has been on rybelsus 14 mg for last year, last A1C 5.9.  No fever, chills, no hematochezia.  No nausea, vomiting.  LLQ pain has improved but slightly there, having BM every other day.  No weight loss.   He  reports that he quit smoking about 30 years ago. His smoking use included cigarettes. He has a 6.90 pack-year smoking history. He has never used smokeless tobacco. He reports that he does not currently use alcohol. He reports that he does not use drugs.  Current Medications:   Current Outpatient Medications (Endocrine & Metabolic):    glimepiride (AMARYL) 2 MG tablet, TAKE 1 TABLET BY MOUTH ONCE DAILY BEFORE SUPPER   RYBELSUS 14 MG TABS, TAKE 1 TABLET BY MOUTH IN THE MORNING 30 MINUTES BEFORE BREAKFAST WITH  A  SIP  OF  WATER  Current Outpatient Medications (Cardiovascular):    atorvastatin (LIPITOR) 20 MG tablet, Take 1 tablet by mouth once daily   furosemide (LASIX) 40 MG tablet, Take 40 mg by mouth daily.   hydrALAZINE (APRESOLINE) 50 MG tablet, Take 1 tablet (50 mg total) by mouth 2 (two) times daily.   metoprolol tartrate (LOPRESSOR) 100 MG tablet, Take 1 tablet by mouth twice daily   sildenafil (VIAGRA) 100 MG tablet, TAKE 1 TABLET BY MOUTH EVERY DAY AS NEEDED FOR ERECTILE DYSFUNCTION  Current Outpatient Medications (Respiratory):    albuterol (PROVENTIL) (2.5 MG/3ML) 0.083% nebulizer solution, Take 3 mLs (2.5 mg total) by  nebulization every 6 (six) hours as needed for wheezing or shortness of breath.   albuterol (VENTOLIN HFA) 108 (90 Base) MCG/ACT inhaler, Inhale 2 puffs into the lungs every 6 (six) hours as needed for wheezing or shortness of breath.   fluticasone-salmeterol (ADVAIR DISKUS) 250-50  MCG/ACT AEPB, Inhale 1 puff into the lungs in the morning and at bedtime.   Tiotropium Bromide Monohydrate (SPIRIVA RESPIMAT) 2.5 MCG/ACT AERS, Inhale 2 puffs into the lungs daily.  Current Outpatient Medications (Analgesics):    allopurinol (ZYLOPRIM) 300 MG tablet, Take 1 tablet by mouth once daily  Current Outpatient Medications (Hematological):    apixaban (ELIQUIS) 2.5 MG TABS tablet, Take 1 tablet (2.5 mg total) by mouth 2 (two) times daily.   FERREX 150 150 MG capsule, Take 1 capsule by mouth once daily  Current Outpatient Medications (Other):    Accu-Chek Softclix Lancets lancets, CHECK BLOOD SUGAR TWICE DAILY AS DIRECTED   Blood Glucose Monitoring Suppl (ACCU-CHEK GUIDE ME) w/Device KIT, 1 each by Does not apply route in the morning and at bedtime. Use Accu Chek Guide me device to check blood sugar twice daily.   COVID-19 mRNA vaccine 2023-2024 (COMIRNATY) syringe, Inject into the muscle.   glucose blood (ACCU-CHEK GUIDE) test strip, USE AS DIRECTED TO TEST BLOOD SUGAR TWICE DAILY   LINZESS 290 MCG CAPS capsule, TAKE 1 CAPSULE BY MOUTH ONCE DAILY BEFORE BREAKFAST   tamsulosin (FLOMAX) 0.4 MG CAPS capsule, TAKE 1 CAPSULE BY MOUTH ONCE DAILY AFTER SUPPER  Medical History:  Past Medical History:  Diagnosis Date   CHF (congestive heart failure) (HCC)    Chronic diastolic heart failure (HCC)    Constipation    Diabetes mellitus    Diverticulitis    Diverticulosis    ED (erectile dysfunction)    Gout    Heart murmur    Hyperlipidemia    Hypertension    IBS (irritable bowel syndrome)    Renal calculus    Renal insufficiency    Tubular adenoma of colon    Allergies:  Allergies  Allergen Reactions   Ace Inhibitors Other (See Comments)    Severe AKI due to ARB + dehydration Aug 2016   Angiotensin Receptor Blockers Other (See Comments)    Severe AKI due to ARB + dehydration Aug 2016     Surgical History:  He  has a past surgical history that includes Colonoscopy (1998);  knee effusion tapped; and Extracorporeal shock wave lithotripsy (Left, 03/20/2019). Family History:  His family history includes Colon cancer in his brother; Diabetes in his father; Heart attack (age of onset: 48) in his mother; Heart disease in his brother; Hypertension in his mother; Irritable bowel syndrome in his brother, father, and sister; Prostate cancer (age of onset: 62) in his father.  REVIEW OF SYSTEMS  : All other systems reviewed and negative except where noted in the History of Present Illness.  PHYSICAL EXAM: BP (!) 140/80   Pulse 75   Ht _0  (1.727 m)   Wt 220 lb 9.6 oz (100.1 kg)   SpO2 95%   BMI 33.54 kg/m  General:   Pleasant, well developed male in no acute distress Head:   Normocephalic and atraumatic. Eyes:  sclerae anicteric,conjunctive pink  Heart:   regular rate and rhythm Pulm:  Clear anteriorly; no wheezing Abdomen:   Soft, Obese AB, Sluggish bowel sounds. mild tenderness in the LLQ. Without guarding and Without rebound, No organomegaly appreciated. Ventral hernia.  Rectal: Not evaluated Extremities:  Without edema. Msk:  Symmetrical without gross deformities. Peripheral pulses intact.  Neurologic:  Alert and  oriented x4;  No focal deficits.  Skin:   Dry and intact without significant lesions or rashes. Psychiatric:  Cooperative. Normal mood and affect.    Vladimir Crofts, PA-C 9:38 AM

## 2022-05-29 NOTE — Patient Instructions (Addendum)
Your provider has requested that you go to the basement level for lab work before leaving today. Press "B" on the elevator. The lab is located at the first door on the left as you exit the elevator.  Can try motegerity samples, once pill a day, can take with the linzess 290 mcg at first but may be able to stop it.   Continue linzess 290 mcg ADD ON MIRALAX 1/2 CAPFUL SEE BELOW Recommend increasing water and physical activity.   Please follow up with Dr. Hilarie Fredrickson in 6 months  Miralax is an osmotic laxative.  It only brings more water into the stool.  This is safe to take daily.  Can take up to 17 gram of miralax twice a day.  Mix with juice or coffee.  Start 1/2 capful at night for 3-4 days and reassess your response in 3-4 days.  You can increase and decrease the dose based on your response.  Remember, it can take up to 3-4 days to take effect OR for the effects to wear off.   I often pair this with benefiber in the morning to help assure the stool is not too loose.   - Drink at least 64-80 ounces of water/liquid per day. - Establish a time to try to move your bowels every day.  For many people, this is after a cup of coffee or after a meal such as breakfast. - Sit all of the way back on the toilet keeping your back fairly straight and while sitting up, try to rest the tops of your forearms on your upper thighs.   - Raising your feet with a step stool/squatty potty can be helpful to improve the angle that allows your stool to pass through the rectum. - Relax the rectum feeling it bulge toward the toilet water.  If you feel your rectum raising toward your body, you are contracting rather than relaxing. - Breathe in and slowly exhale. "Belly breath" by expanding your belly towards your belly button. Keep belly expanded as you gently direct pressure down and back to the anus.  A low pitched GRRR sound can assist with increasing intra-abdominal pressure.  - Repeat 3-4 times. If unsuccessful,  contract the pelvic floor to restore normal tone and get off the toilet.  Avoid excessive straining. - To reduce excessive wiping by teaching your anus to normally contract, place hands on outer aspect of knees and resist knee movement outward.  Hold 5-10 second then place hands just inside of knees and resist inward movement of knees.  Hold 5 seconds.  Repeat a few times each way.  Go to the ER if unable to pass gas, severe AB pain, unable to hold down food, any shortness of breath of chest pain.  Diverticulosis Diverticulosis is a condition that develops when small pouches (diverticula) form in the wall of the large intestine (colon). The colon is where water is absorbed and stool (feces) is formed. The pouches form when the inside layer of the colon pushes through weak spots in the outer layers of the colon. You may have a few pouches or many of them. The pouches usually do not cause problems unless they become inflamed or infected. When this happens, the condition is called diverticulitis- this is left lower quadrant pain, diarrhea, fever, chills, nausea or vomiting.  If this occurs please call the office or go to the hospital. Sometimes these patches without inflammation can also have painless bleeding associated with them, if this happens please call the  office or go to the hospital. Preventing constipation and increasing fiber can help reduce diverticula and prevent complications. Even if you feel you have a high-fiber diet, suggest getting on Benefiber or Cirtracel 2 times daily.

## 2022-06-03 ENCOUNTER — Other Ambulatory Visit: Payer: Self-pay | Admitting: Endocrinology

## 2022-06-05 DIAGNOSIS — D631 Anemia in chronic kidney disease: Secondary | ICD-10-CM | POA: Diagnosis not present

## 2022-06-05 DIAGNOSIS — E1122 Type 2 diabetes mellitus with diabetic chronic kidney disease: Secondary | ICD-10-CM | POA: Diagnosis not present

## 2022-06-05 DIAGNOSIS — I129 Hypertensive chronic kidney disease with stage 1 through stage 4 chronic kidney disease, or unspecified chronic kidney disease: Secondary | ICD-10-CM | POA: Diagnosis not present

## 2022-06-05 DIAGNOSIS — N1831 Chronic kidney disease, stage 3a: Secondary | ICD-10-CM | POA: Diagnosis not present

## 2022-06-05 DIAGNOSIS — N2581 Secondary hyperparathyroidism of renal origin: Secondary | ICD-10-CM | POA: Diagnosis not present

## 2022-06-07 NOTE — Progress Notes (Signed)
Addendum: Reviewed and agree with assessment and management plan. Dariane Natzke M, MD  

## 2022-06-14 ENCOUNTER — Other Ambulatory Visit: Payer: Self-pay | Admitting: Internal Medicine

## 2022-06-15 ENCOUNTER — Ambulatory Visit: Payer: Medicare PPO | Admitting: Nurse Practitioner

## 2022-06-27 ENCOUNTER — Other Ambulatory Visit: Payer: Self-pay | Admitting: Endocrinology

## 2022-06-28 ENCOUNTER — Other Ambulatory Visit: Payer: Self-pay | Admitting: Family

## 2022-06-28 DIAGNOSIS — Z86718 Personal history of other venous thrombosis and embolism: Secondary | ICD-10-CM

## 2022-06-28 DIAGNOSIS — Z86711 Personal history of pulmonary embolism: Secondary | ICD-10-CM

## 2022-07-04 ENCOUNTER — Other Ambulatory Visit: Payer: Self-pay | Admitting: Internal Medicine

## 2022-07-08 ENCOUNTER — Other Ambulatory Visit: Payer: Self-pay | Admitting: Endocrinology

## 2022-07-09 ENCOUNTER — Other Ambulatory Visit: Payer: Self-pay | Admitting: Internal Medicine

## 2022-07-25 ENCOUNTER — Other Ambulatory Visit: Payer: Self-pay | Admitting: Endocrinology

## 2022-07-25 DIAGNOSIS — E669 Obesity, unspecified: Secondary | ICD-10-CM

## 2022-07-25 DIAGNOSIS — R051 Acute cough: Secondary | ICD-10-CM | POA: Diagnosis not present

## 2022-07-25 DIAGNOSIS — E1169 Type 2 diabetes mellitus with other specified complication: Secondary | ICD-10-CM

## 2022-07-25 DIAGNOSIS — J42 Unspecified chronic bronchitis: Secondary | ICD-10-CM | POA: Diagnosis not present

## 2022-07-26 ENCOUNTER — Other Ambulatory Visit: Payer: Self-pay | Admitting: Internal Medicine

## 2022-08-01 ENCOUNTER — Encounter: Payer: Self-pay | Admitting: Pharmacist

## 2022-08-10 ENCOUNTER — Encounter: Payer: Self-pay | Admitting: Internal Medicine

## 2022-08-10 NOTE — Progress Notes (Signed)
Subjective:    Patient ID: Noah Barnes, male    DOB: 10-02-1945, 77 y.o.   MRN: [id number]     HPI Ronold is here for follow up of his chronic medical problems, including DM, htn, hld, CKD, gout, h/o DVT/PE, knee OA, BPH  Doing well.  Taking his medications as prescribed.  He is not exercising regularly.  He plans on getting back to the gym in March.  Sugars good at home.  103 this am.   Medications and allergies reviewed with patient and updated if appropriate.  Current Outpatient Medications on File Prior to Visit  Medication Sig Dispense Refill   ACCU-CHEK GUIDE test strip USE AS DIRECTED TO TEST BLOOD SUGAR TWICE DAILY 100 strip 3   Accu-Chek Softclix Lancets lancets CHECK BLOOD SUGAR TWICE A DAY. 100 each 2   albuterol (PROVENTIL) (2.5 MG/3ML) 0.083% nebulizer solution Take 3 mLs (2.5 mg total) by nebulization every 6 (six) hours as needed for wheezing or shortness of breath. 75 mL 5   albuterol (VENTOLIN HFA) 108 (90 Base) MCG/ACT inhaler Inhale 2 puffs into the lungs every 6 (six) hours as needed for wheezing or shortness of breath. 8 g 0   allopurinol (ZYLOPRIM) 300 MG tablet Take 1 tablet by mouth once daily 90 tablet 2   atorvastatin (LIPITOR) 20 MG tablet Take 1 tablet by mouth once daily 90 tablet 2   Blood Glucose Monitoring Suppl (ACCU-CHEK GUIDE ME) w/Device KIT 1 each by Does not apply route in the morning and at bedtime. Use Accu Chek Guide me device to check blood sugar twice daily. 1 kit 0   ELIQUIS 2.5 MG TABS tablet Take 1 tablet by mouth twice daily 180 tablet 0   FERREX 150 150 MG capsule Take 1 capsule by mouth once daily 90 capsule 0   fluticasone-salmeterol (ADVAIR DISKUS) 250-50 MCG/ACT AEPB Inhale 1 puff into the lungs in the morning and at bedtime. 60 each 5   furosemide (LASIX) 40 MG tablet Take 40 mg by mouth daily.     glimepiride (AMARYL) 2 MG tablet TAKE 1 TABLET BY MOUTH ONCE DAILY BEFORE SUPPER 90 tablet 0   hydrALAZINE (APRESOLINE) 50 MG  tablet Take 1 tablet (50 mg total) by mouth 2 (two) times daily. 180 tablet 1   LINZESS 290 MCG CAPS capsule TAKE 1 CAPSULE BY MOUTH ONCE DAILY BEFORE BREAKFAST 90 capsule 0   metoprolol tartrate (LOPRESSOR) 100 MG tablet Take 1 tablet by mouth twice daily 180 tablet 0   Semaglutide (RYBELSUS) 14 MG TABS TAKE 1 TABLET BY MOUTH IN THE MORNING 30 MINUTES BEFORE BREAKFAST WITH SIP OF WATER 30 tablet 0   sildenafil (VIAGRA) 100 MG tablet TAKE 1 TABLET BY MOUTH EVERY DAY AS NEEDED FOR ERECTILE DYSFUNCTION 6 tablet 1   tamsulosin (FLOMAX) 0.4 MG CAPS capsule TAKE 1 CAPSULE BY MOUTH ONCE DAILY AFTER SUPPER 90 capsule 0   Tiotropium Bromide Monohydrate (SPIRIVA RESPIMAT) 2.5 MCG/ACT AERS Inhale 2 puffs into the lungs daily. 4 g 5   No current facility-administered medications on file prior to visit.     Review of Systems  Constitutional:  Negative for fever.  Respiratory:  Negative for cough, shortness of breath and wheezing.   Cardiovascular:  Positive for leg swelling (intermittent). Negative for chest pain and palpitations.  Gastrointestinal:  Positive for constipation.  Genitourinary:  Negative for difficulty urinating, dysuria and hematuria.  Musculoskeletal:  Positive for arthralgias.  Neurological:  Negative for light-headedness and  headaches.       Objective:   Vitals:   08/11/22 0803  BP: 120/74  Pulse: 60  Temp: 98 F (36.7 C)  SpO2: 95%   BP Readings from Last 3 Encounters:  08/11/22 120/74  05/29/22 (!) 140/80  05/23/22 (!) 147/91   Wt Readings from Last 3 Encounters:  08/11/22 219 lb (99.3 kg)  05/29/22 220 lb 9.6 oz (100.1 kg)  05/19/22 225 lb (102.1 kg)   Body mass index is 33.3 kg/m.    Physical Exam Constitutional:      General: He is not in acute distress.    Appearance: Normal appearance. He is not ill-appearing.  HENT:     Head: Normocephalic and atraumatic.  Eyes:     Conjunctiva/sclera: Conjunctivae normal.  Cardiovascular:     Rate and Rhythm:  Normal rate and regular rhythm.     Heart sounds: Normal heart sounds. No murmur heard. Pulmonary:     Effort: Pulmonary effort is normal. No respiratory distress.     Breath sounds: Normal breath sounds. No wheezing or rales.  Musculoskeletal:     Right lower leg: No edema.     Left lower leg: No edema.  Skin:    General: Skin is warm and dry.     Findings: No rash.  Neurological:     Mental Status: He is alert. Mental status is at baseline.  Psychiatric:        Mood and Affect: Mood normal.        Lab Results  Component Value Date   WBC 3.2 (L) 05/29/2022   HGB 13.1 05/29/2022   HCT 40.9 05/29/2022   PLT 204.0 05/29/2022   GLUCOSE 99 05/29/2022   CHOL 124 02/08/2022   TRIG 59.0 02/08/2022   HDL 46.30 02/08/2022   LDLDIRECT 80.1 04/15/2014   LDLCALC 66 02/08/2022   ALT 39 05/29/2022   AST 20 05/29/2022   NA 138 05/29/2022   K 3.8 05/29/2022   CL 108 05/29/2022   CREATININE 1.66 (H) 05/29/2022   BUN 31 (H) 05/29/2022   CO2 20 05/29/2022   TSH 2.07 12/28/2020   PSA 0.70 12/18/2016   INR 1.2 03/26/2019   HGBA1C 6.1 02/06/2022   MICROALBUR 3.0 (H) 09/29/2021     Assessment & Plan:    See Problem List for Assessment and Plan of chronic medical problems.

## 2022-08-10 NOTE — Patient Instructions (Addendum)
      Blood work was ordered.   The lab is on the first floor.    Medications changes include :   None       Return in about 6 months (around 02/09/2023) for Physical Exam.

## 2022-08-11 ENCOUNTER — Other Ambulatory Visit: Payer: Self-pay | Admitting: Internal Medicine

## 2022-08-11 ENCOUNTER — Ambulatory Visit (INDEPENDENT_AMBULATORY_CARE_PROVIDER_SITE_OTHER): Payer: Medicare PPO | Admitting: Internal Medicine

## 2022-08-11 ENCOUNTER — Other Ambulatory Visit: Payer: Self-pay

## 2022-08-11 VITALS — BP 120/74 | HR 60 | Temp 98.0°F | Ht 68.0 in | Wt 219.0 lb

## 2022-08-11 DIAGNOSIS — Z86711 Personal history of pulmonary embolism: Secondary | ICD-10-CM | POA: Diagnosis not present

## 2022-08-11 DIAGNOSIS — I700 Atherosclerosis of aorta: Secondary | ICD-10-CM

## 2022-08-11 DIAGNOSIS — N1831 Chronic kidney disease, stage 3a: Secondary | ICD-10-CM

## 2022-08-11 DIAGNOSIS — I5032 Chronic diastolic (congestive) heart failure: Secondary | ICD-10-CM

## 2022-08-11 DIAGNOSIS — I10 Essential (primary) hypertension: Secondary | ICD-10-CM | POA: Diagnosis not present

## 2022-08-11 DIAGNOSIS — N401 Enlarged prostate with lower urinary tract symptoms: Secondary | ICD-10-CM

## 2022-08-11 DIAGNOSIS — E782 Mixed hyperlipidemia: Secondary | ICD-10-CM | POA: Diagnosis not present

## 2022-08-11 DIAGNOSIS — Z86718 Personal history of other venous thrombosis and embolism: Secondary | ICD-10-CM | POA: Diagnosis not present

## 2022-08-11 DIAGNOSIS — M109 Gout, unspecified: Secondary | ICD-10-CM | POA: Diagnosis not present

## 2022-08-11 DIAGNOSIS — E1129 Type 2 diabetes mellitus with other diabetic kidney complication: Secondary | ICD-10-CM | POA: Diagnosis not present

## 2022-08-11 DIAGNOSIS — M170 Bilateral primary osteoarthritis of knee: Secondary | ICD-10-CM

## 2022-08-11 LAB — COMPREHENSIVE METABOLIC PANEL
ALT: 53 U/L (ref 0–53)
AST: 21 U/L (ref 0–37)
Albumin: 4.1 g/dL (ref 3.5–5.2)
Alkaline Phosphatase: 79 U/L (ref 39–117)
BUN: 31 mg/dL — ABNORMAL HIGH (ref 6–23)
CO2: 19 mEq/L (ref 19–32)
Calcium: 9.4 mg/dL (ref 8.4–10.5)
Chloride: 110 mEq/L (ref 96–112)
Creatinine, Ser: 1.56 mg/dL — ABNORMAL HIGH (ref 0.40–1.50)
GFR: 43.15 mL/min — ABNORMAL LOW (ref >60.00)
Glucose, Bld: 104 mg/dL — ABNORMAL HIGH (ref 70–99)
Potassium: 4.3 mEq/L (ref 3.5–5.1)
Sodium: 139 mEq/L (ref 135–145)
Total Bilirubin: 0.6 mg/dL (ref 0.2–1.2)
Total Protein: 6.8 g/dL (ref 6.0–8.3)

## 2022-08-11 LAB — LIPID PANEL
Cholesterol: 142 mg/dL (ref 0–200)
HDL: 50.4 mg/dL (ref >39.00)
LDL Cholesterol: 78 mg/dL (ref 0–99)
NonHDL: 91.15
Total CHOL/HDL Ratio: 3
Triglycerides: 66 mg/dL (ref 0.0–149.0)
VLDL: 13.2 mg/dL (ref 0.0–40.0)

## 2022-08-11 LAB — CBC WITH DIFFERENTIAL/PLATELET
Basophils Absolute: 0 10*3/uL (ref 0.0–0.1)
Basophils Relative: 0.9 % (ref 0.0–3.0)
Eosinophils Absolute: 0.1 10*3/uL (ref 0.0–0.7)
Eosinophils Relative: 3.5 % (ref 0.0–5.0)
HCT: 42.8 % (ref 39.0–52.0)
Hemoglobin: 13.8 g/dL (ref 13.0–17.0)
Lymphocytes Relative: 31.6 % (ref 12.0–46.0)
Lymphs Abs: 1.3 10*3/uL (ref 0.7–4.0)
MCHC: 32.2 g/dL (ref 30.0–36.0)
MCV: 84.6 fl (ref 78.0–100.0)
Monocytes Absolute: 0.4 10*3/uL (ref 0.1–1.0)
Monocytes Relative: 10.1 % (ref 3.0–12.0)
Neutro Abs: 2.3 10*3/uL (ref 1.4–7.7)
Neutrophils Relative %: 53.9 % (ref 43.0–77.0)
Platelets: 202 10*3/uL (ref 150.0–400.0)
RBC: 5.06 Mil/uL (ref 4.22–5.81)
RDW: 14.8 % (ref 11.5–15.5)
WBC: 4.2 10*3/uL (ref 4.0–10.5)

## 2022-08-11 LAB — HEMOGLOBIN A1C: Hgb A1c MFr Bld: 5.8 % (ref 4.6–6.5)

## 2022-08-11 LAB — MICROALBUMIN / CREATININE URINE RATIO
Creatinine,U: 135.5 mg/dL
Microalb Creat Ratio: 1.1 mg/g (ref 0.0–30.0)
Microalb, Ur: 1.5 mg/dL (ref 0.0–1.9)

## 2022-08-11 NOTE — Assessment & Plan Note (Signed)
Chronic On lifelong anticoagulation for prevention Eliquis 2.5 mg twice daily CBC, CMP

## 2022-08-11 NOTE — Assessment & Plan Note (Signed)
Chronic Continue atorvastatin 20 mg daily 

## 2022-08-11 NOTE — Assessment & Plan Note (Signed)
Chronic Controlled Continue Flomax 0.4 mg daily

## 2022-08-11 NOTE — Assessment & Plan Note (Signed)
Chronic Blood pressure well controlled CMP Continue hydralazine 50 mg twice daily, metoprolol 100 mg twice daily

## 2022-08-11 NOTE — Assessment & Plan Note (Signed)
Chronic Euvolemic On furosemide 40 mg daily, metoprolol 100 mg twice daily

## 2022-08-11 NOTE — Assessment & Plan Note (Signed)
Chronic °Regular exercise and healthy diet encouraged °Check lipid panel  °Continue atorvastatin 20 mg daily °

## 2022-08-11 NOTE — Assessment & Plan Note (Signed)
Chronic Controlled Continue allopurinol 300 mg daily

## 2022-08-11 NOTE — Assessment & Plan Note (Addendum)
Chronic   Lab Results  Component Value Date   HGBA1C 6.1 02/06/2022   Sugars well controlled Check A1c , urine microalb Management per Dr. Napoleon Form glimepiride 2 mg daily, Rybelsus 14 mg daily Stressed regular exercise, diabetic diet

## 2022-08-11 NOTE — Assessment & Plan Note (Signed)
Chronic Following with nephrology CBC, CMP

## 2022-08-15 ENCOUNTER — Other Ambulatory Visit: Payer: Medicare PPO

## 2022-08-17 ENCOUNTER — Ambulatory Visit: Payer: Medicare PPO | Admitting: Endocrinology

## 2022-08-17 ENCOUNTER — Encounter: Payer: Self-pay | Admitting: Endocrinology

## 2022-08-17 ENCOUNTER — Other Ambulatory Visit: Payer: Self-pay | Admitting: Internal Medicine

## 2022-08-17 VITALS — BP 120/78 | HR 75 | Ht 68.0 in | Wt 219.0 lb

## 2022-08-17 DIAGNOSIS — E119 Type 2 diabetes mellitus without complications: Secondary | ICD-10-CM | POA: Diagnosis not present

## 2022-08-17 NOTE — Progress Notes (Signed)
Patient ID: Noah Barnes, male   DOB: [birth year], 77 y.o.   MRN: [id number]    Reason for Appointment : Follow-up visit - History of Present Illness          Diagnosis: Type 2 diabetes mellitus, date of diagnosis: 2008       Past history: His diabetes was borderline in the beginning and not clear what medications he was started on. Has been taking metformin for several years and this was later changed to Novice. Amaryl probably started in 5/14 when blood sugars were higher, initially was given 1 mg and now has been taking 2 mg. He had a relatively high A1c of 7.1, difficulty losing weight and a glucose of 202 on his initial consultation in 06/2013 He was then tried on Invokana in addition to his Janumet and Amaryl Invokana was reduced to 100 mg in early 2015 been creatinine had gone up to 1.6  Recent history:   Oral hypoglycemic drugs the patient is taking are: Amaryl 2  mg at supper, Rybelsus 14 mg daily   His A1c is consistently in the normal range at 5.8, previously was at 6.1  His previous range 5.8-6.6 Fructosamine last 241  Current management, blood sugar patterns and problems identified:    His fasting blood sugars appear to be slightly lower recently than before but he does not report any hypoglycemia overnight He has excellent readings at home in the low 100s even after dinner as before He has not been able to lose any further weight Recently not exercising He thinks he is trying to eat a healthy diet   Side effects from medications have been:  renal dysfunction from Invokana  Glucose monitoring:  done <1 times a day         Glucometer:  Accu-Chek   Blood Glucose readings by meter download:   PRE-MEAL Fasting Lunch Dinner Bedtime Overall  Glucose range: 80-103      Mean/median:     101   POST-MEAL PC Breakfast PC Lunch PC Dinner  Glucose range:   99-144  Mean/median:      Previously  PRE-MEAL Fasting Lunch Dinner Bedtime Overall  Glucose  range: 91-116      Mean/median:     111   POST-MEAL PC Breakfast PC Lunch PC Dinner  Glucose range:   101-135  Mean/median:       Glycemic control:   Lab Results  Component Value Date   HGBA1C 5.8 08/11/2022   HGBA1C 6.1 02/06/2022   HGBA1C 5.9 09/29/2021   Lab Results  Component Value Date   MICROALBUR 1.5 08/11/2022   LDLCALC 78 08/11/2022   CREATININE 1.56 (H) 08/11/2022    Self-care: Usually has low-fat diet, eggs in am with some carbohydrate, frequently no lunch     Dietician consultations: 08/2013              Weight history:   Wt Readings from Last 3 Encounters:  08/17/22 219 lb (99.3 kg)  08/11/22 219 lb (99.3 kg)  05/29/22 220 lb 9.6 oz (100.1 kg)       Allergies as of 08/17/2022       Reactions   Ace Inhibitors Other (See Comments)   Severe AKI due to ARB + dehydration Aug 2016   Angiotensin Receptor Blockers Other (See Comments)   Severe AKI due to ARB + dehydration Aug 2016        Medication List        Accurate as of  August 17, 2022  8:44 AM. If you have any questions, ask your nurse or doctor.          Accu-Chek Guide Me w/Device Kit 1 each by Does not apply route in the morning and at bedtime. Use Accu Chek Guide me device to check blood sugar twice daily.   Accu-Chek Guide test strip Generic drug: glucose blood USE AS DIRECTED TO TEST BLOOD SUGAR TWICE DAILY   Accu-Chek Softclix Lancets lancets CHECK BLOOD SUGAR TWICE A DAY.   albuterol 108 (90 Base) MCG/ACT inhaler Commonly known as: VENTOLIN HFA Inhale 2 puffs into the lungs every 6 (six) hours as needed for wheezing or shortness of breath.   albuterol (2.5 MG/3ML) 0.083% nebulizer solution Commonly known as: PROVENTIL Take 3 mLs (2.5 mg total) by nebulization every 6 (six) hours as needed for wheezing or shortness of breath.   allopurinol 300 MG tablet Commonly known as: ZYLOPRIM Take 1 tablet by mouth once daily   atorvastatin 20 MG tablet Commonly known as:  LIPITOR Take 1 tablet by mouth once daily   Eliquis 2.5 MG Tabs tablet Generic drug: apixaban Take 1 tablet by mouth twice daily   Ferrex 150 150 MG capsule Generic drug: iron polysaccharides Take 1 capsule by mouth once daily   fluticasone-salmeterol 250-50 MCG/ACT Aepb Commonly known as: Advair Diskus Inhale 1 puff into the lungs in the morning and at bedtime.   furosemide 40 MG tablet Commonly known as: LASIX Take 40 mg by mouth daily.   glimepiride 2 MG tablet Commonly known as: AMARYL TAKE 1 TABLET BY MOUTH ONCE DAILY BEFORE SUPPER   hydrALAZINE 50 MG tablet Commonly known as: APRESOLINE Take 1 tablet (50 mg total) by mouth 2 (two) times daily.   Linzess 290 MCG Caps capsule Generic drug: linaclotide TAKE 1 CAPSULE BY MOUTH ONCE DAILY BEFORE BREAKFAST   metoprolol tartrate 100 MG tablet Commonly known as: LOPRESSOR Take 1 tablet by mouth twice daily   Rybelsus 14 MG Tabs Generic drug: Semaglutide TAKE 1 TABLET BY MOUTH IN THE MORNING 30 MINUTES BEFORE BREAKFAST WITH SIP OF WATER   sildenafil 100 MG tablet Commonly known as: VIAGRA TAKE 1 TABLET BY MOUTH EVERY DAY AS NEEDED FOR ERECTILE DYSFUNCTION   Spiriva Respimat 2.5 MCG/ACT Aers Generic drug: Tiotropium Bromide Monohydrate Inhale 2 puffs into the lungs daily.   tamsulosin 0.4 MG Caps capsule Commonly known as: FLOMAX TAKE 1 CAPSULE BY MOUTH ONCE DAILY AFTER SUPPER        Allergies:  Allergies  Allergen Reactions   Ace Inhibitors Other (See Comments)    Severe AKI due to ARB + dehydration Aug 2016   Angiotensin Receptor Blockers Other (See Comments)    Severe AKI due to ARB + dehydration Aug 2016    Past Medical History:  Diagnosis Date   CHF (congestive heart failure) (HCC)    Chronic diastolic heart failure (HCC)    Constipation    Diabetes mellitus    Diverticulitis    Diverticulosis    ED (erectile dysfunction)    Gout    Heart murmur    Hyperlipidemia    Hypertension    IBS  (irritable bowel syndrome)    Renal calculus    Renal insufficiency    Tubular adenoma of colon     Past Surgical History:  Procedure Laterality Date   COLONOSCOPY  1998   negative; no F/U   EXTRACORPOREAL SHOCK WAVE LITHOTRIPSY Left 03/20/2019   Procedure: EXTRACORPOREAL SHOCK WAVE LITHOTRIPSY (ESWL);  Surgeon: Cleon Gustin, MD;  Location: [facility];  Service: Urology;  Laterality: Left;   knee effusion tapped      Family History  Problem Relation Age of Onset   Heart attack Mother 21   Hypertension Mother    Prostate cancer Father 8   Diabetes Father    Irritable bowel syndrome Father    Irritable bowel syndrome Sister        x 2   Colon cancer Brother        dx in his late 73's   Heart disease Brother    Irritable bowel syndrome Brother    Stroke Neg Hx    Esophageal cancer Neg Hx    Rectal cancer Neg Hx    Stomach cancer Neg Hx     Social History:  reports that he quit smoking about 31 years ago. His smoking use included cigarettes. He has a 6.90 pack-year smoking history. He has never used smokeless tobacco. He reports that he does not currently use alcohol. He reports that he does not use drugs.    Review of Systems   RENAL dysfunction: Creatinine is as follows, followed by nephrologist  Lab Results  Component Value Date   CREATININE 1.56 (H) 08/11/2022   CREATININE 1.66 (H) 05/29/2022   CREATININE 1.92 (H) 03/28/2022   CREATININE 1.51 (H) 02/08/2022     Lab Results  Component Value Date   K 4.3 08/11/2022         Lipids: Has been on Lipitor 20 mg for hyperlipidemia, baseline LDL about 162 Followed by Dr. Quay Burow  Lab Results  Component Value Date   CHOL 142 08/11/2022   HDL 50.40 08/11/2022   LDLCALC 78 08/11/2022   LDLDIRECT 80.1 04/15/2014   TRIG 66.0 08/11/2022   CHOLHDL 3 08/11/2022       The blood pressure has been high for several years and usually well controlled.  Currently taking, hydralazine, and metoprolol from his PCP Also  monitoring at home  BP Readings from Last 3 Encounters:  08/17/22 120/78  08/11/22 120/74  05/29/22 (!) 140/80      LABS:  Office Visit on 08/11/2022  Component Date Value Ref Range Status   Microalb, Ur 08/11/2022 1.5  0.0 - 1.9 mg/dL Final   Creatinine,U 08/11/2022 135.5  mg/dL Final   Microalb Creat Ratio 08/11/2022 1.1  0.0 - 30.0 mg/g Final   Cholesterol 08/11/2022 142  0 - 200 mg/dL Final   ATP III Classification       Desirable:  < 200 mg/dL               Borderline High:  200 - 239 mg/dL          High:  > = 240 mg/dL   Triglycerides 08/11/2022 66.0  0.0 - 149.0 mg/dL Final   Normal:  <150 mg/dLBorderline High:  150 - 199 mg/dL   HDL 08/11/2022 50.40  >39.00 mg/dL Final   VLDL 08/11/2022 13.2  0.0 - 40.0 mg/dL Final   LDL Cholesterol 08/11/2022 78  0 - 99 mg/dL Final   Total CHOL/HDL Ratio 08/11/2022 3   Final                  Men          Women1/2 Average Risk     3.4          3.3Average Risk          5.0  4.42X Average Risk          9.6          7.13X Average Risk          15.0          11.0                       NonHDL 08/11/2022 91.15   Final   NOTE:  Non-HDL goal should be 30 mg/dL higher than patient's LDL goal (i.e. LDL goal of < 70 mg/dL, would have non-HDL goal of < 100 mg/dL)   Hgb A1c MFr Bld 08/11/2022 5.8  4.6 - 6.5 % Final   Glycemic Control Guidelines for People with Diabetes:Non Diabetic:  <6%Goal of Therapy: <7%Additional Action Suggested:  >8%    Sodium 08/11/2022 139  135 - 145 mEq/L Final   Potassium 08/11/2022 4.3  3.5 - 5.1 mEq/L Final   Chloride 08/11/2022 110  96 - 112 mEq/L Final   CO2 08/11/2022 19  19 - 32 mEq/L Final   Glucose, Bld 08/11/2022 104 (H)  70 - 99 mg/dL Final   BUN 08/11/2022 31 (H)  6 - 23 mg/dL Final   Creatinine, Ser 08/11/2022 1.56 (H)  0.40 - 1.50 mg/dL Final   Total Bilirubin 08/11/2022 0.6  0.2 - 1.2 mg/dL Final   Alkaline Phosphatase 08/11/2022 79  39 - 117 U/L Final   AST 08/11/2022 21  0 - 37 U/L Final   ALT  08/11/2022 53  0 - 53 U/L Final   Total Protein 08/11/2022 6.8  6.0 - 8.3 g/dL Final   Albumin 08/11/2022 4.1  3.5 - 5.2 g/dL Final   GFR 08/11/2022 43.15 (L)  >60.00 mL/min Final   Calculated using the CKD-EPI Creatinine Equation (2021)   Calcium 08/11/2022 9.4  8.4 - 10.5 mg/dL Final   WBC 08/11/2022 4.2  4.0 - 10.5 K/uL Final   RBC 08/11/2022 5.06  4.22 - 5.81 Mil/uL Final   Hemoglobin 08/11/2022 13.8  13.0 - 17.0 g/dL Final   HCT 08/11/2022 42.8  39.0 - 52.0 % Final   MCV 08/11/2022 84.6  78.0 - 100.0 fl Final   MCHC 08/11/2022 32.2  30.0 - 36.0 g/dL Final   RDW 08/11/2022 14.8  11.5 - 15.5 % Final   Platelets 08/11/2022 202.0  150.0 - 400.0 K/uL Final   Neutrophils Relative % 08/11/2022 53.9  43.0 - 77.0 % Final   Lymphocytes Relative 08/11/2022 31.6  12.0 - 46.0 % Final   Monocytes Relative 08/11/2022 10.1  3.0 - 12.0 % Final   Eosinophils Relative 08/11/2022 3.5  0.0 - 5.0 % Final   Basophils Relative 08/11/2022 0.9  0.0 - 3.0 % Final   Neutro Abs 08/11/2022 2.3  1.4 - 7.7 K/uL Final   Lymphs Abs 08/11/2022 1.3  0.7 - 4.0 K/uL Final   Monocytes Absolute 08/11/2022 0.4  0.1 - 1.0 K/uL Final   Eosinophils Absolute 08/11/2022 0.1  0.0 - 0.7 K/uL Final   Basophils Absolute 08/11/2022 0.0  0.0 - 0.1 K/uL Final    Physical Examination:  BP 120/78 (BP Location: Left Arm, Patient Position: Sitting, Cuff Size: Normal)   Pulse 75   Ht 5' 8"$  (1.727 m)   Wt 219 lb (99.3 kg)   SpO2 94%   BMI 33.30 kg/m     ASSESSMENT/PLAN:   Diabetes type 2, with obesity  See history of present illness for detailed discussion of current diabetes management, blood sugar  patterns and problems identified  He is on Rybelsus 14 mg and Amaryl 2 mg  A1c is 5.8 and usually around 6  His blood sugars both fasting and after dinner are fairly close to normal Has stable control with Rybelsus and no reported hypoglycemia with Amaryl However cannot do better with exercise  Continue the same dose of  Rybelsus but reduce Amaryl to 1 mg or half tablet of the 2 mg  RENAL dysfunction: His creatinine is fairly stable but he can still ask his nephrologist about Farxiga   There are no Patient Instructions on file for this visit.    Elayne Snare 08/17/2022, 8:44 AM

## 2022-08-17 NOTE — Patient Instructions (Addendum)
Glimeperide 68m at supper daily  Exercise  Ask Kidney Dr about FWilder Glade

## 2022-08-19 ENCOUNTER — Other Ambulatory Visit: Payer: Self-pay | Admitting: Endocrinology

## 2022-08-19 DIAGNOSIS — E1169 Type 2 diabetes mellitus with other specified complication: Secondary | ICD-10-CM

## 2022-08-19 DIAGNOSIS — E669 Obesity, unspecified: Secondary | ICD-10-CM

## 2022-08-21 ENCOUNTER — Ambulatory Visit (HOSPITAL_BASED_OUTPATIENT_CLINIC_OR_DEPARTMENT_OTHER): Payer: Medicare PPO | Admitting: Pulmonary Disease

## 2022-08-25 DIAGNOSIS — J42 Unspecified chronic bronchitis: Secondary | ICD-10-CM | POA: Diagnosis not present

## 2022-08-25 DIAGNOSIS — R051 Acute cough: Secondary | ICD-10-CM | POA: Diagnosis not present

## 2022-08-31 ENCOUNTER — Other Ambulatory Visit: Payer: Self-pay | Admitting: Hematology & Oncology

## 2022-08-31 DIAGNOSIS — Z86711 Personal history of pulmonary embolism: Secondary | ICD-10-CM

## 2022-08-31 DIAGNOSIS — Z86718 Personal history of other venous thrombosis and embolism: Secondary | ICD-10-CM

## 2022-09-23 ENCOUNTER — Other Ambulatory Visit: Payer: Self-pay | Admitting: Endocrinology

## 2022-09-23 DIAGNOSIS — R051 Acute cough: Secondary | ICD-10-CM | POA: Diagnosis not present

## 2022-09-23 DIAGNOSIS — J42 Unspecified chronic bronchitis: Secondary | ICD-10-CM | POA: Diagnosis not present

## 2022-09-26 ENCOUNTER — Inpatient Hospital Stay: Payer: Medicare PPO | Admitting: Family

## 2022-09-26 ENCOUNTER — Encounter: Payer: Self-pay | Admitting: Family

## 2022-09-26 ENCOUNTER — Inpatient Hospital Stay: Payer: Medicare PPO | Attending: Hematology & Oncology

## 2022-09-26 VITALS — BP 112/66 | HR 57 | Temp 98.1°F | Resp 17 | Wt 220.4 lb

## 2022-09-26 DIAGNOSIS — Z7901 Long term (current) use of anticoagulants: Secondary | ICD-10-CM

## 2022-09-26 DIAGNOSIS — Z86718 Personal history of other venous thrombosis and embolism: Secondary | ICD-10-CM | POA: Diagnosis not present

## 2022-09-26 DIAGNOSIS — Z86711 Personal history of pulmonary embolism: Secondary | ICD-10-CM | POA: Diagnosis not present

## 2022-09-26 LAB — CMP (CANCER CENTER ONLY)
ALT: 48 U/L — ABNORMAL HIGH (ref 0–44)
AST: 21 U/L (ref 15–41)
Albumin: 4.1 g/dL (ref 3.5–5.0)
Alkaline Phosphatase: 75 U/L (ref 38–126)
Anion gap: 7 (ref 5–15)
BUN: 29 mg/dL — ABNORMAL HIGH (ref 8–23)
CO2: 21 mmol/L — ABNORMAL LOW (ref 22–32)
Calcium: 9.1 mg/dL (ref 8.9–10.3)
Chloride: 110 mmol/L (ref 98–111)
Creatinine: 1.69 mg/dL — ABNORMAL HIGH (ref 0.61–1.24)
GFR, Estimated: 42 mL/min — ABNORMAL LOW
Glucose, Bld: 108 mg/dL — ABNORMAL HIGH (ref 70–99)
Potassium: 4.1 mmol/L (ref 3.5–5.1)
Sodium: 138 mmol/L (ref 135–145)
Total Bilirubin: 0.7 mg/dL (ref 0.3–1.2)
Total Protein: 6.3 g/dL — ABNORMAL LOW (ref 6.5–8.1)

## 2022-09-26 LAB — CBC WITH DIFFERENTIAL (CANCER CENTER ONLY)
Abs Immature Granulocytes: 0.01 K/uL (ref 0.00–0.07)
Basophils Absolute: 0 K/uL (ref 0.0–0.1)
Basophils Relative: 1 %
Eosinophils Absolute: 0.1 K/uL (ref 0.0–0.5)
Eosinophils Relative: 3 %
HCT: 41.2 % (ref 39.0–52.0)
Hemoglobin: 13.2 g/dL (ref 13.0–17.0)
Immature Granulocytes: 0 %
Lymphocytes Relative: 27 %
Lymphs Abs: 1 K/uL (ref 0.7–4.0)
MCH: 27 pg (ref 26.0–34.0)
MCHC: 32 g/dL (ref 30.0–36.0)
MCV: 84.3 fL (ref 80.0–100.0)
Monocytes Absolute: 0.4 K/uL (ref 0.1–1.0)
Monocytes Relative: 11 %
Neutro Abs: 2.3 K/uL (ref 1.7–7.7)
Neutrophils Relative %: 58 %
Platelet Count: 188 K/uL (ref 150–400)
RBC: 4.89 MIL/uL (ref 4.22–5.81)
RDW: 14.1 % (ref 11.5–15.5)
WBC Count: 3.8 K/uL — ABNORMAL LOW (ref 4.0–10.5)
nRBC: 0 % (ref 0.0–0.2)

## 2022-09-26 NOTE — Progress Notes (Signed)
Hematology and Oncology Follow Up Visit  Noah Barnes [id number] [birth year] 77 y.o. 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 He is doing quite well and has no complaints at this time.  He continues to tolerate Eliquis 2.5 mg PO BID nicely.  No blood loss, bruising or petechiae.  No fever, chills, n/v, cough, rash, dizziness, SOB, chest pain, palpitations, abdominal pain or changes in bowel or bladder habits.  No swelling, numbness or tingling in his extremities at this time. 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  No falls or syncope reported.  Appetite and hydration are good. Weight is stable at 220 lbs.   ECOG Performance Status: 0 - Asymptomatic  Medications:  Allergies as of 09/26/2022       Reactions   Ace Inhibitors Other (See Comments)   Severe AKI due to ARB + dehydration Aug 2016   Angiotensin Receptor Blockers Other (See Comments)   Severe AKI due to ARB + dehydration Aug 2016        Medication List        Accurate as of September 26, 2022  9:57 AM. If you have any questions, ask your nurse or doctor.          Accu-Chek Guide Me w/Device Kit 1 each by Does not apply route in the morning and at bedtime. Use Accu Chek Guide me device to check blood sugar twice daily.   Accu-Chek Guide test strip Generic drug: glucose blood USE AS DIRECTED TO TEST BLOOD SUGAR TWICE DAILY   Accu-Chek Softclix Lancets lancets CHECK BLOOD SUGAR TWICE A DAY.   albuterol 108 (90 Base) MCG/ACT inhaler Commonly known as: VENTOLIN HFA Inhale 2 puffs into the lungs every 6 (six) hours as needed for wheezing or shortness of breath.   albuterol (2.5 MG/3ML) 0.083% nebulizer solution Commonly known as: PROVENTIL Take 3 mLs (2.5 mg total) by nebulization every 6 (six) hours as  needed for wheezing or shortness of breath.   allopurinol 300 MG tablet Commonly known as: ZYLOPRIM Take 1 tablet by mouth once daily   atorvastatin 20 MG tablet Commonly known as: LIPITOR Take 1 tablet by mouth once daily   Eliquis 2.5 MG Tabs tablet Generic drug: apixaban Take 1 tablet by mouth twice daily   Ferrex 150 150 MG capsule Generic drug: iron polysaccharides Take 1 capsule by mouth once daily   fluticasone-salmeterol 250-50 MCG/ACT Aepb Commonly known as: Advair Diskus Inhale 1 puff into the lungs in the morning and at bedtime.   furosemide 40 MG tablet Commonly known as: LASIX Take 40 mg by mouth daily.   glimepiride 2 MG tablet Commonly known as: AMARYL TAKE 1 TABLET BY MOUTH ONCE DAILY BEFORE SUPPER   hydrALAZINE 50 MG tablet Commonly known as: APRESOLINE Take 1 tablet (50 mg total) by mouth 2 (two) times daily.   Linzess 290 MCG Caps capsule Generic drug: linaclotide TAKE 1 CAPSULE BY MOUTH ONCE DAILY BEFORE BREAKFAST   metoprolol tartrate 100 MG tablet Commonly known as: LOPRESSOR Take 1 tablet by mouth twice daily   Rybelsus 14 MG Tabs Generic drug: Semaglutide TAKE 1 TABLET BY MOUTH IN THE MORNING 30 MINUTES BEFORE BREAKFAST WITH SIP OF WATER   sildenafil 100 MG tablet Commonly known as: VIAGRA TAKE 1  TABLET BY MOUTH EVERY DAY AS NEEDED FOR ERECTILE DYSFUNCTION   Spiriva Respimat 2.5 MCG/ACT Aers Generic drug: Tiotropium Bromide Monohydrate Inhale 2 puffs into the lungs daily.   tamsulosin 0.4 MG Caps capsule Commonly known as: FLOMAX TAKE 1 CAPSULE BY MOUTH ONCE DAILY AFTER SUPPER        Allergies:  Allergies  Allergen Reactions   Ace Inhibitors Other (See Comments)    Severe AKI due to ARB + dehydration Aug 2016   Angiotensin Receptor Blockers Other (See Comments)    Severe AKI due to ARB + dehydration Aug 2016    Past Medical History, Surgical history, Social history, and Family History were reviewed and updated.  Review  of Systems: All other 10 point review of systems is negative.   Physical Exam:  vitals were not taken for this visit.   Wt Readings from Last 3 Encounters:  08/17/22 219 lb (99.3 kg)  08/11/22 219 lb (99.3 kg)  05/29/22 220 lb 9.6 oz (100.1 kg)    Ocular: Sclerae unicteric, pupils equal, round and reactive to light Ear-nose-throat: Oropharynx clear, dentition fair Lymphatic: No cervical or supraclavicular adenopathy Lungs no rales or rhonchi, good excursion bilaterally Heart regular rate and rhythm, no murmur appreciated Abd soft, nontender, positive bowel sounds MSK no focal spinal tenderness, no joint edema Neuro: non-focal, well-oriented, appropriate affect Breasts: Deferred   Lab Results  Component Value Date   WBC 4.2 08/11/2022   HGB 13.8 08/11/2022   HCT 42.8 08/11/2022   MCV 84.6 08/11/2022   PLT 202.0 08/11/2022   Lab Results  Component Value Date   FERRITIN 211 03/28/2019   IRON 29 (L) 03/28/2019   TIBC 216 (L) 03/28/2019   UIBC 187 03/28/2019   IRONPCTSAT 13 (L) 03/28/2019   Lab Results  Component Value Date   RETICCTPCT 1.2 03/28/2019   RBC 5.06 08/11/2022   No results found for: "KPAFRELGTCHN", "LAMBDASER", "KAPLAMBRATIO" No results found for: "IGGSERUM", "IGA", "IGMSERUM" No results found for: "TOTALPROTELP", "ALBUMINELP", "A1GS", "A2GS", "BETS", "BETA2SER", "GAMS", "MSPIKE", "SPEI"   Chemistry      Component Value Date/Time   NA 139 08/11/2022 0845   K 4.3 08/11/2022 0845   CL 110 08/11/2022 0845   CO2 19 08/11/2022 0845   BUN 31 (H) 08/11/2022 0845   CREATININE 1.56 (H) 08/11/2022 0845   CREATININE 1.92 (H) 03/28/2022 0946      Component Value Date/Time   CALCIUM 9.4 08/11/2022 0845   ALKPHOS 79 08/11/2022 0845   AST 21 08/11/2022 0845   AST 18 03/28/2022 0946   ALT 53 08/11/2022 0845   ALT 43 03/28/2022 0946   BILITOT 0.6 08/11/2022 0845   BILITOT 0.6 03/28/2022 0946       Impression and Plan: Noah Barnes is a very pleasant 77 yo  African American gentleman with 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 He completed 6 months of full anticoagulation and is now doing well on maintenance low dose Eliquis lifelong.  He continues to do well and so far there has been no evidence of recurrence.  No changes to Eliquis regimen.  Follow-up in 6 months.   Lottie Dawson, NP 3/26/20249:57 AM

## 2022-10-01 ENCOUNTER — Other Ambulatory Visit: Payer: Self-pay | Admitting: Internal Medicine

## 2022-10-17 ENCOUNTER — Other Ambulatory Visit: Payer: Self-pay | Admitting: Internal Medicine

## 2022-10-20 ENCOUNTER — Other Ambulatory Visit: Payer: Self-pay | Admitting: Internal Medicine

## 2022-10-26 ENCOUNTER — Other Ambulatory Visit: Payer: Self-pay | Admitting: Internal Medicine

## 2022-10-31 ENCOUNTER — Encounter: Payer: Self-pay | Admitting: Internal Medicine

## 2022-10-31 ENCOUNTER — Ambulatory Visit: Payer: Medicare PPO | Admitting: Internal Medicine

## 2022-10-31 VITALS — BP 136/80 | HR 65 | Temp 98.3°F | Ht 68.0 in | Wt 220.0 lb

## 2022-10-31 DIAGNOSIS — M25511 Pain in right shoulder: Secondary | ICD-10-CM

## 2022-10-31 DIAGNOSIS — I10 Essential (primary) hypertension: Secondary | ICD-10-CM

## 2022-10-31 NOTE — Progress Notes (Signed)
Subjective:    Patient ID: Noah Barnes, male    DOB: Sep 07, 1945, 77 y.o.   MRN: [id number]      HPI Noah Barnes is here for  Chief Complaint  Patient presents with   Shoulder Pain    Right shoulder pain worse than left. Patient reports falling out of bed last month in his sleep.   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  He was dreaming and he jumped and rolled over and fell on the floor.  He landed on his right shoulder and he aggravated it.  He has pain from shoulder down to elbow.  It has not improved.  Heat helps.  No swelling.  He has decreased ROM which is new since the fall.  No numbness/tingling.  No hand weakness     Medications and allergies reviewed with patient and updated if appropriate.  Current Outpatient Medications on File Prior to Visit  Medication Sig Dispense Refill   ACCU-CHEK GUIDE test strip USE AS DIRECTED TO TEST BLOOD SUGAR TWICE DAILY 100 strip 3   Accu-Chek Softclix Lancets lancets CHECK BLOOD SUGAR TWICE A DAY. 100 each 2   albuterol (PROVENTIL) (2.5 MG/3ML) 0.083% nebulizer solution Take 3 mLs (2.5 mg total) by nebulization every 6 (six) hours as needed for wheezing or shortness of breath. 75 mL 5   albuterol (VENTOLIN HFA) 108 (90 Base) MCG/ACT inhaler Inhale 2 puffs into the lungs every 6 (six) hours as needed for wheezing or shortness of breath. 8 g 0   allopurinol (ZYLOPRIM) 300 MG tablet Take 1 tablet by mouth once daily 90 tablet 2   atorvastatin (LIPITOR) 20 MG tablet Take 1 tablet by mouth once daily 90 tablet 2   Blood Glucose Monitoring Suppl (ACCU-CHEK GUIDE ME) w/Device KIT 1 each by Does not apply route in the morning and at bedtime. Use Accu Chek Guide me device to check blood sugar twice daily. 1 kit 0   ELIQUIS 2.5 MG TABS tablet Take 1 tablet by mouth twice daily 180 tablet 0   FERREX 150 150 MG capsule Take 1 capsule by mouth once daily 90 capsule 0   fluticasone-salmeterol (ADVAIR  DISKUS) 250-50 MCG/ACT AEPB Inhale 1 puff into the lungs in the morning and at bedtime. 60 each 5   furosemide (LASIX) 40 MG tablet Take 40 mg by mouth daily.     glimepiride (AMARYL) 2 MG tablet TAKE 1 TABLET BY MOUTH ONCE DAILY BEFORE SUPPER 90 tablet 1   hydrALAZINE (APRESOLINE) 50 MG tablet Take 1 tablet by mouth twice daily 180 tablet 0   LINZESS 290 MCG CAPS capsule TAKE 1 CAPSULE BY MOUTH ONCE DAILY BEFORE BREAKFAST 90 capsule 0   metoprolol tartrate (LOPRESSOR) 100 MG tablet Take 1 tablet by mouth twice daily 180 tablet 0   Semaglutide (RYBELSUS) 14 MG TABS TAKE 1 TABLET BY MOUTH IN THE MORNING 30 MINUTES BEFORE BREAKFAST WITH SIP OF WATER 30 tablet 3   sildenafil (VIAGRA) 100 MG tablet TAKE 1 TABLET BY MOUTH EVERY DAY AS NEEDED FOR ERECTILE DYSFUNCTION 6 tablet 1   tamsulosin (FLOMAX) 0.4 MG CAPS capsule TAKE 1 CAPSULE BY MOUTH ONCE DAILY AFTER SUPPER 90 capsule 0   Tiotropium Bromide Monohydrate (SPIRIVA RESPIMAT) 2.5 MCG/ACT AERS Inhale 2 puffs into the lungs daily. 4 g 5   No current facility-administered medications on file prior to visit.    Review of Systems  Objective:   Vitals:   10/31/22 1501  BP: 136/80  Pulse: 65  Temp: 98.3 F (36.8 C)  SpO2: 98%   BP Readings from Last 3 Encounters:  10/31/22 136/80  09/26/22 112/66  08/17/22 120/78   Wt Readings from Last 3 Encounters:  10/31/22 220 lb (99.8 kg)  09/26/22 220 lb 6.4 oz (100 kg)  08/17/22 219 lb (99.3 kg)   Body mass index is 33.45 kg/m.    Physical Exam    A Right Shoulder exam was performed.   SWELLING: none  EFFUSION: no  WARMTH: no warmth  TENDERNESS: tenderness superior shoulder at [facility] joint ROM: dec ROM with pain  NEUROLOGICAL EXAM: normal sensation and strength  PULSES: normal       Assessment & Plan:    See Problem List for Assessment and Plan of chronic medical problems.

## 2022-10-31 NOTE — Assessment & Plan Note (Signed)
Acute on chronic pain after fall one month ago Has known OA - since fall has increased pain and dec ROM Concern for rotator cuff injury Will refer to sports medicine - would benefit from imaging Will likely need PT - will hold off until he sees sports medicine Continue heat Can take tylenol as needed

## 2022-10-31 NOTE — Assessment & Plan Note (Signed)
Chronic Blood pressure well controlled Continue hydralazine 50 mg twice daily, metoprolol 100 mg twice daily

## 2022-10-31 NOTE — Patient Instructions (Signed)
     Make an appointment with sports medicine downstairs for your acute on chronic right shoulder pain.     Medications changes include :   none    A referral was ordered for sports medicine.

## 2022-11-01 ENCOUNTER — Ambulatory Visit (INDEPENDENT_AMBULATORY_CARE_PROVIDER_SITE_OTHER): Payer: Medicare PPO

## 2022-11-01 ENCOUNTER — Ambulatory Visit: Payer: Medicare PPO | Admitting: Sports Medicine

## 2022-11-01 VITALS — BP 132/82 | HR 64 | Ht 68.0 in | Wt 220.0 lb

## 2022-11-01 DIAGNOSIS — G8929 Other chronic pain: Secondary | ICD-10-CM | POA: Diagnosis not present

## 2022-11-01 DIAGNOSIS — M25512 Pain in left shoulder: Secondary | ICD-10-CM | POA: Diagnosis not present

## 2022-11-01 DIAGNOSIS — M25511 Pain in right shoulder: Secondary | ICD-10-CM

## 2022-11-01 NOTE — Patient Instructions (Addendum)
Good to see you Shoulder HEP  Tylenol [phone] mg 2-3 times a day for pain relief  Voltaren gel topically 1-2 times per day  PT referral  5 week follow up

## 2022-11-01 NOTE — Progress Notes (Signed)
Noah Barnes D.[name] [address] [place] [address] Phone: [phone]   Assessment and Plan:     1. Chronic pain of both shoulders -Chronic with exacerbation, initial sports medicine visit - Most consistent with flare of osteoarthritis +/- impingement syndrome on right side worse than left based on HPI, physical exam, x-ray - Do not recommend NSAID or prednisone use due to past medical history of DM type II and chronic anticoagulation with Eliquis - Start Tylenol 500 to 1000 mg tablets 2-3 times a day for day-to-day pain relief - Start Voltaren gel topically over areas of pain - Start HEP and physical therapy for shoulders - X-rays obtained in clinic.  My interpretation: No acute fracture or dislocation.  Decreased glenohumeral joint space on right with inferior glenoid cystic changes, humeral head spurring.  Cortical changes along acromion and humeral head on left.  Bilateral AC joint degenerative changes. - DG Shoulder Left; Future - DG Shoulder Right; Future    Pertinent previous records reviewed include internal medicine note 10/31/2022   Follow Up: 5 weeks for reevaluation.  If no improvement or worsening of symptoms, could consider subacromial CSI   Subjective:   I, Noah Barnes, am serving as a Neurosurgeon for Doctor Richardean Sale  Chief Complaint: bilat shoulder pain   HPI:   11/01/22 Patient is a 77 year old male complaining of bilateral shoulder pain. Patient states that he thinks he has arthritis bilat, states he fell on his right shoulder a month ago, pain radiates down his arm, left isnt as bad as the right , no meds for the pain, he uses a cream that gets hot and that helps , decreased ROM he feels a twinge , if his arms are at rest for a long period of time he has pain   Relevant Historical Information: DM type II, CKD, history of DVT and PE on chronic anticoagulation with Eliquis  Additional pertinent review of  systems negative.   Current Outpatient Medications:    ACCU-CHEK GUIDE test strip, USE AS DIRECTED TO TEST BLOOD SUGAR TWICE DAILY, Disp: 100 strip, Rfl: 3   Accu-Chek Softclix Lancets lancets, CHECK BLOOD SUGAR TWICE A DAY., Disp: 100 each, Rfl: 2   albuterol (PROVENTIL) (2.5 MG/3ML) 0.083% nebulizer solution, Take 3 mLs (2.5 mg total) by nebulization every 6 (six) hours as needed for wheezing or shortness of breath., Disp: 75 mL, Rfl: 5   albuterol (VENTOLIN HFA) 108 (90 Base) MCG/ACT inhaler, Inhale 2 puffs into the lungs every 6 (six) hours as needed for wheezing or shortness of breath., Disp: 8 g, Rfl: 0   allopurinol (ZYLOPRIM) 300 MG tablet, Take 1 tablet by mouth once daily, Disp: 90 tablet, Rfl: 2   atorvastatin (LIPITOR) 20 MG tablet, Take 1 tablet by mouth once daily, Disp: 90 tablet, Rfl: 2   Blood Glucose Monitoring Suppl (ACCU-CHEK GUIDE ME) w/Device KIT, 1 each by Does not apply route in the morning and at bedtime. Use Accu Chek Guide me device to check blood sugar twice daily., Disp: 1 kit, Rfl: 0   ELIQUIS 2.5 MG TABS tablet, Take 1 tablet by mouth twice daily, Disp: 180 tablet, Rfl: 0   FERREX 150 150 MG capsule, Take 1 capsule by mouth once daily, Disp: 90 capsule, Rfl: 0   fluticasone-salmeterol (ADVAIR DISKUS) 250-50 MCG/ACT AEPB, Inhale 1 puff into the lungs in the morning and at bedtime., Disp: 60 each, Rfl: 5   furosemide (LASIX) 40 MG tablet,  Take 40 mg by mouth daily., Disp: , Rfl:    glimepiride (AMARYL) 2 MG tablet, TAKE 1 TABLET BY MOUTH ONCE DAILY BEFORE SUPPER, Disp: 90 tablet, Rfl: 1   hydrALAZINE (APRESOLINE) 50 MG tablet, Take 1 tablet by mouth twice daily, Disp: 180 tablet, Rfl: 0   LINZESS 290 MCG CAPS capsule, TAKE 1 CAPSULE BY MOUTH ONCE DAILY BEFORE BREAKFAST, Disp: 90 capsule, Rfl: 0   metoprolol tartrate (LOPRESSOR) 100 MG tablet, Take 1 tablet by mouth twice daily, Disp: 180 tablet, Rfl: 0   Semaglutide (RYBELSUS) 14 MG TABS, TAKE 1 TABLET BY MOUTH IN THE  MORNING 30 MINUTES BEFORE BREAKFAST WITH SIP OF WATER, Disp: 30 tablet, Rfl: 3   sildenafil (VIAGRA) 100 MG tablet, TAKE 1 TABLET BY MOUTH EVERY DAY AS NEEDED FOR ERECTILE DYSFUNCTION, Disp: 6 tablet, Rfl: 1   tamsulosin (FLOMAX) 0.4 MG CAPS capsule, TAKE 1 CAPSULE BY MOUTH ONCE DAILY AFTER SUPPER, Disp: 90 capsule, Rfl: 0   Tiotropium Bromide Monohydrate (SPIRIVA RESPIMAT) 2.5 MCG/ACT AERS, Inhale 2 puffs into the lungs daily., Disp: 4 g, Rfl: 5   Objective:     Vitals:   11/01/22 0925  BP: 132/82  Pulse: 64  SpO2: 97%  Weight: 220 lb (99.8 kg)  Height: 5\' 8"  (1.727 m)      Body mass index is 33.45 kg/m.    Physical Exam:    Gen: Appears well, nad, nontoxic and pleasant Neuro:sensation intact, strength is 5/5 with df/pf/inv/ev, muscle tone wnl Skin: no suspicious lesion or defmority Psych: A&O, appropriate mood and affect  Right shoulder:  No deformity, swelling or muscle wasting No scapular winging FF 180, abd 170, int 5, ext 75 NTTP over the [place], clavicle, ac, coracoid, biceps groove, humerus, deltoid, trapezius, cervical spine Positive Hawkins, empty can, O'Brien Neg neer,   crossarm, subscap liftoff, speeds Neg ant drawer, sulcus sign, apprehension Negative Spurling's test bilat FROM of neck    Electronically signed by:  Noah Barnes D.[name] 9:46 AM 11/01/22

## 2022-11-07 ENCOUNTER — Other Ambulatory Visit: Payer: Self-pay | Admitting: Internal Medicine

## 2022-11-09 ENCOUNTER — Other Ambulatory Visit: Payer: Self-pay | Admitting: Internal Medicine

## 2022-11-29 DIAGNOSIS — M1990 Unspecified osteoarthritis, unspecified site: Secondary | ICD-10-CM | POA: Diagnosis not present

## 2022-11-29 DIAGNOSIS — I110 Hypertensive heart disease with heart failure: Secondary | ICD-10-CM | POA: Diagnosis not present

## 2022-11-29 DIAGNOSIS — N529 Male erectile dysfunction, unspecified: Secondary | ICD-10-CM | POA: Diagnosis not present

## 2022-11-29 DIAGNOSIS — E119 Type 2 diabetes mellitus without complications: Secondary | ICD-10-CM | POA: Diagnosis not present

## 2022-11-29 DIAGNOSIS — J4489 Other specified chronic obstructive pulmonary disease: Secondary | ICD-10-CM | POA: Diagnosis not present

## 2022-11-29 DIAGNOSIS — K581 Irritable bowel syndrome with constipation: Secondary | ICD-10-CM | POA: Diagnosis not present

## 2022-11-29 DIAGNOSIS — E785 Hyperlipidemia, unspecified: Secondary | ICD-10-CM | POA: Diagnosis not present

## 2022-11-29 DIAGNOSIS — M109 Gout, unspecified: Secondary | ICD-10-CM | POA: Diagnosis not present

## 2022-11-29 NOTE — Progress Notes (Signed)
Noah Barnes D.[name] [address] [place] [address] Phone: [phone]   Assessment and Plan:     1. Chronic pain of both shoulders  -Chronic with exacerbation, subsequent visit - Overall pain has improved and is currently well-controlled using Tylenol as needed, topical Biofreeze, HEP.  Pain is still consistent with flares of osteoarthritis typically worse on right compared to left - Patient starts physical therapy tomorrow.  Encouraged to continue physical therapy - May continue Tylenol day-to-day as needed - May use Voltaren gel or Biofreeze gels as needed - Continue HEP - Do not recommend prescription strength NSAID or prednisone use due to past medical history of DM type II and chronic anticoagulation on Eliquis  Pertinent previous records reviewed include none   Follow Up: 4 weeks for reevaluation.  If no improvement or worsening of symptoms, could consider subacromial CSI   Subjective:   I, Noah Barnes, am serving as a Neurosurgeon for Doctor Richardean Sale   Chief Complaint: bilat shoulder pain    HPI:    11/01/22 Patient is a 77 year old male complaining of bilateral shoulder pain. Patient states that he thinks he has arthritis bilat, states he fell on his right shoulder a month ago, pain radiates down his arm, left isnt as bad as the right , no meds for the pain, he uses a cream that gets hot and that helps , decreased ROM he feels a twinge , if his arms are at rest for a long period of time he has pain   12/06/2022 Patient states he is able to tolerate the pain before he gets the needle, he uses tylenol if it gets bad along with the Voltaren. Starts PT tomorrow     Relevant Historical Information: DM type II, CKD, history of DVT and PE on chronic anticoagulation with Eliquis  Additional pertinent review of systems negative.   Current Outpatient Medications:    ACCU-CHEK GUIDE test strip, USE AS DIRECTED TO TEST BLOOD  SUGAR TWICE DAILY, Disp: 100 strip, Rfl: 3   Accu-Chek Softclix Lancets lancets, CHECK BLOOD SUGAR TWICE A DAY., Disp: 100 each, Rfl: 2   albuterol (PROVENTIL) (2.5 MG/3ML) 0.083% nebulizer solution, Take 3 mLs (2.5 mg total) by nebulization every 6 (six) hours as needed for wheezing or shortness of breath., Disp: 75 mL, Rfl: 5   albuterol (VENTOLIN HFA) 108 (90 Base) MCG/ACT inhaler, Inhale 2 puffs into the lungs every 6 (six) hours as needed for wheezing or shortness of breath., Disp: 8 g, Rfl: 0   allopurinol (ZYLOPRIM) 300 MG tablet, Take 1 tablet by mouth once daily, Disp: 90 tablet, Rfl: 2   atorvastatin (LIPITOR) 20 MG tablet, Take 1 tablet by mouth once daily, Disp: 90 tablet, Rfl: 2   Blood Glucose Monitoring Suppl (ACCU-CHEK GUIDE ME) w/Device KIT, 1 each by Does not apply route in the morning and at bedtime. Use Accu Chek Guide me device to check blood sugar twice daily., Disp: 1 kit, Rfl: 0   ELIQUIS 2.5 MG TABS tablet, Take 1 tablet by mouth twice daily, Disp: 180 tablet, Rfl: 0   FERREX 150 150 MG capsule, Take 1 capsule by mouth once daily, Disp: 90 capsule, Rfl: 0   fluticasone-salmeterol (ADVAIR DISKUS) 250-50 MCG/ACT AEPB, Inhale 1 puff into the lungs in the morning and at bedtime., Disp: 60 each, Rfl: 5   furosemide (LASIX) 40 MG tablet, Take 40 mg by mouth daily., Disp: , Rfl:    glimepiride (  AMARYL) 2 MG tablet, TAKE 1 TABLET BY MOUTH ONCE DAILY BEFORE SUPPER, Disp: 90 tablet, Rfl: 1   hydrALAZINE (APRESOLINE) 50 MG tablet, Take 1 tablet by mouth twice daily, Disp: 180 tablet, Rfl: 0   LINZESS 290 MCG CAPS capsule, TAKE 1 CAPSULE BY MOUTH ONCE DAILY BEFORE BREAKFAST, Disp: 90 capsule, Rfl: 0   metoprolol tartrate (LOPRESSOR) 100 MG tablet, Take 1 tablet by mouth twice daily, Disp: 180 tablet, Rfl: 0   Semaglutide (RYBELSUS) 14 MG TABS, TAKE 1 TABLET BY MOUTH IN THE MORNING 30 MINUTES BEFORE BREAKFAST WITH SIP OF WATER, Disp: 30 tablet, Rfl: 3   sildenafil (VIAGRA) 100 MG tablet,  TAKE 1 TABLET BY MOUTH EVERY DAY AS NEEDED FOR ERECTILE DYSFUNCTION, Disp: 6 tablet, Rfl: 1   tamsulosin (FLOMAX) 0.4 MG CAPS capsule, TAKE 1 CAPSULE BY MOUTH ONCE DAILY AFTER SUPPER, Disp: 90 capsule, Rfl: 0   Tiotropium Bromide Monohydrate (SPIRIVA RESPIMAT) 2.5 MCG/ACT AERS, Inhale 2 puffs into the lungs daily., Disp: 4 g, Rfl: 5   Objective:     Vitals:   12/06/22 0936  BP: 132/80  Pulse: 71  SpO2: 96%  Weight: 220 lb (99.8 kg)  Height: 5\' 8"  (1.727 m)      Body mass index is 33.45 kg/m.    Physical Exam:    Gen: Appears well, nad, nontoxic and pleasant Neuro:sensation intact, strength is 5/5 with df/pf/inv/ev, muscle tone wnl Skin: no suspicious lesion or defmority Psych: A&O, appropriate mood and affect   Right shoulder:  No deformity, swelling or muscle wasting No scapular winging FF 180, abd 170, int 5, ext 75 NTTP over the , clavicle, ac, coracoid, biceps groove, humerus, deltoid, trapezius, cervical spine Positive Hawkins, empty can, O'Brien Neg neer,   crossarm, subscap liftoff, speeds Neg ant drawer, sulcus sign, apprehension Negative Spurling's test bilat FROM of neck       Electronically signed by:  Noah Barnes D.[name] 9:47 AM 12/06/22

## 2022-12-06 ENCOUNTER — Ambulatory Visit: Payer: Medicare PPO | Admitting: Sports Medicine

## 2022-12-06 VITALS — BP 132/80 | HR 71 | Ht 68.0 in | Wt 220.0 lb

## 2022-12-06 DIAGNOSIS — G8929 Other chronic pain: Secondary | ICD-10-CM | POA: Diagnosis not present

## 2022-12-06 DIAGNOSIS — M25512 Pain in left shoulder: Secondary | ICD-10-CM

## 2022-12-06 DIAGNOSIS — M25511 Pain in right shoulder: Secondary | ICD-10-CM

## 2022-12-06 NOTE — Therapy (Signed)
OUTPATIENT PHYSICAL THERAPY SHOULDER EVALUATION   Patient Name: Noah Barnes MRN: [id number] DOB:[birth year], 77 y.o., male Today's Date: 12/07/2022  END OF SESSION:  PT End of Session - 12/07/22 0931     Visit Number 1    Number of Visits 13    Date for PT Re-Evaluation 02/01/23    Authorization Type [employer]    Authorization Time Period auth tbd    Progress Note Due on Visit 10    PT Start Time 0932    PT Stop Time 1021    PT Time Calculation (min) 49 min    Activity Tolerance Patient tolerated treatment well;No increased pain    Behavior During Therapy WFL for tasks assessed/performed             Past Medical History:  Diagnosis Date   CHF (congestive heart failure) (HCC)    Chronic diastolic heart failure (HCC)    Constipation    Diabetes mellitus    Diverticulitis    Diverticulosis    ED (erectile dysfunction)    Gout    Heart murmur    Hyperlipidemia    Hypertension    IBS (irritable bowel syndrome)    Renal calculus    Renal insufficiency    Tubular adenoma of colon    Past Surgical History:  Procedure Laterality Date   COLONOSCOPY  1998   negative; no F/U   EXTRACORPOREAL SHOCK WAVE LITHOTRIPSY Left 03/20/2019   Procedure: EXTRACORPOREAL SHOCK WAVE LITHOTRIPSY (ESWL);  Surgeon: Malen Gauze, MD;  Location: [facility];  Service: Urology;  Laterality: Left;   knee effusion tapped     Patient Active Problem List   Diagnosis Date Noted   Acute pain of both shoulders 10/31/2022   Right shoulder pain 10/31/2022   Acute cough 04/19/2021   Right-sided chest pain 04/19/2021   Chest congestion 04/15/2021   Acquired trigger finger of right little finger 12/30/2020   Bilateral primary osteoarthritis of knee 12/30/2020   Aortic atherosclerosis (HCC) 12/29/2020   Chronic bronchitis (HCC) 07/28/2020   BPH (benign prostatic hyperplasia) 07/05/2020   Left lower quadrant abdominal pain 11/28/2019   Lung nodule - stable 04/04/2019   History of DVT of  lower extremity, b/l and PE 04/03/2019   History of pulmonary embolus (PE) 04/03/2019   Hypomagnesemia 04/03/2019   Nephrolithiasis 03/06/2019   Seasonal allergic rhinitis 05/22/2018   MR (mitral regurgitation) 12/20/2017   CKD (chronic kidney disease) stage 3, GFR 30-59 ml/min (HCC) 08/14/2017   Abdominal hernia without obstruction and without gangrene 01/10/2016   Chronic diastolic heart failure (HCC) 02/25/2015   Right renal mass 02/25/2015   Other constipation 02/22/2015   Chronic venous insufficiency 01/18/2015   Superficial phlebitis 01/18/2015   IBS (irritable bowel syndrome) 11/12/2013   Family history of prostate cancer 12/26/2011   Diabetes mellitus with renal complications (HCC) 12/18/2008   Gout 12/31/2007   HYPERLIPIDEMIA 08/14/2007   ERECTILE DYSFUNCTION 05/09/2007   Essential hypertension 11/28/2006    PCP: Pincus Sanes, MD  REFERRING PROVIDER: Richardean Sale, DO  REFERRING DIAG: M25.511,G89.29,M25.512 (ICD-10-CM) - Chronic pain of both shoulders  THERAPY DIAG:  Bilateral shoulder pain, unspecified chronicity  Abnormal posture  Muscle weakness (generalized)  Stiffness of right shoulder, not elsewhere classified  Stiffness of left shoulder, not elsewhere classified  Rationale for Evaluation and Treatment: Rehabilitation  ONSET DATE: at least 6-7 months  SUBJECTIVE:  SUBJECTIVE STATEMENT: Pt states he has been diagnosed with shoulder arthritis. Gradual onset, worsening over past few months. Worst in the morning and with cold weather, lack of movement. Difficulty with lifting movements (greater than 25-40#). Some difficulty with upper body dressing.  Used to be fairly active, going to [employer], walking. Wants to get back into [facility] for the whirlpool.  No N/T or swelling. Does  occasionally refer some pain into lateral RUE toward elbow, noting distally.  Hand dominance: Ambidextrous (generally right handed, uses L hand for athletics)  PERTINENT HISTORY: CHF, DM, HTN, renal history, hx DVT/PE. Pt states all well controlled  PAIN:  Are you having pain: 4/10 Location/description: BIL shoulders R>L Best-worst over past week: 2-4/10  - aggravating factors: cold, sitting 20-11min, reaching overhead, lifting - Easing factors: movement, heat (especially hot showers), warm lotion, tylenol  PRECAUTIONS: cardiac hx, hx DVT/PE  WEIGHT BEARING RESTRICTIONS: No  FALLS:  Has patient fallen in last 6 months? No  LIVING ENVIRONMENT: 1 story house, basement Lives with spouse who works, pt is retired  OCCUPATION: Worked Building surveyor for 43 years, retired; used to Psychologist, occupational basketball  PLOF: Independent   PATIENT GOALS: be able to do regular activities  NEXT MD VISIT: July 10th Dr. Jean Rosenthal  OBJECTIVE:   DIAGNOSTIC FINDINGS:  Refer to [facility] for impression of BIL shoulder XR (11/01/22), no acute issues noted per impressions  PATIENT SURVEYS:  FOTO 52 current, 67 predicted  COGNITION: Overall cognitive status: Within functional limits for tasks assessed     SENSATION: Light touch intact BIL UE   POSTURE: Significant kyphosis and forward head posture, RUE held in more IR than L  UPPER EXTREMITY ROM:  A/PROM Right eval Left eval  Shoulder flexion 100 deg * 108 deg *  Shoulder abduction 88 deg * 102 deg *  Shoulder internal rotation    Shoulder external rotation (functional combo) Occiput * C6 *  Elbow flexion    Elbow extension    Wrist flexion    Wrist extension     (Blank rows = not tested) (Key: WFL = within functional limits not formally assessed, * = concordant pain, s = stiffness/stretching sensation, NT = not tested)  Comments:    UPPER EXTREMITY MMT:  MMT Right eval Left eval  Shoulder flexion 4- * 4-  Shoulder extension    Shoulder  abduction 4 * 4+  Shoulder internal rotation 4- 4  Shoulder external rotation 3+ * 4  Elbow flexion    Elbow extension    Grip strength    (Blank rows = not tested)  (Key: WFL = within functional limits not formally assessed, * = concordant pain, s = stiffness/stretching sensation, NT = not tested)  Comments:   SHOULDER SPECIAL TESTS: NT  JOINT MOBILITY TESTING:  NT  PALPATION:  Concordant tenderness BIL deltoids, infraspinatus, teres minor/major. Generalized tightness upper traps and rhomboids bilat   TODAY'S TREATMENT:  [facility] Adult PT Treatment:                                                DATE: 12/07/22 Therapeutic Exercise: Scapular retractions x10 cues for appropriate mechanics, reduced elbow compensations Standing shoulder flexion walkback at counter, x10 cues for comfortable ROM and appropriate setup HEP handout + education, rationale for interventions and relevant anatomy/physiology   PATIENT EDUCATION: Education details: Pt education on PT impairments, prognosis, and POC. Informed consent. Rationale for interventions, safe/appropriate HEP performance, relevant anatomy/physiology  Person educated: Patient Education method: Explanation, Demonstration, Tactile cues, Verbal cues, and Handouts Education comprehension: verbalized understanding, returned demonstration, verbal cues required, tactile cues required, and needs further education    HOME EXERCISE PROGRAM: Access Code: [email or web address] URL: [email or web address] Date: 12/07/2022 Prepared by: Fransisco Hertz  Exercises - Standing 'L' Stretch at Counter  - 1 x daily - 7 x weekly - 3 sets - 10 reps - Seated Scapular Retraction  - 1 x daily - 7 x weekly - 3 sets - 10 reps  ASSESSMENT:  CLINICAL IMPRESSION: Pt is a pleasant 77 year old gentleman who arrives to PT evaluation on  this date for BIL shoulder pain ongoing for past several months. Pt reports difficulty with lifting, raising arms overhead, and morning activites due to pain. During today's session pt demonstrates postural deficits as above, limitations in BIL GH mobility and strength (R more affected than L) which are likely contributing to difficulty with aforementioned activities. Recommend skilled PT to address aforementioned deficits to improve functional independence/tolerance. No adverse events, tolerates HEP well with emphasis on improving postural extension for preparation into GH overhead movements, pt endorses improved symptoms after GH flexion stretch. Pt departs today's session in no acute distress, all voiced questions/concerns addressed appropriately from PT perspective.    OBJECTIVE IMPAIRMENTS: decreased activity tolerance, decreased endurance, decreased mobility, decreased ROM, decreased strength, hypomobility, impaired UE functional use, postural dysfunction, and pain.   ACTIVITY LIMITATIONS: carrying, lifting, sitting, dressing, and reach over head  PARTICIPATION LIMITATIONS: meal prep, cleaning, and community activity  PERSONAL FACTORS: Age, Time since onset of injury/illness/exacerbation, and 3+ comorbidities: cardiac hx, hx DVT/PE, HTN  are also affecting patient's functional outcome.   REHAB POTENTIAL: Good  CLINICAL DECISION MAKING: Stable/uncomplicated  EVALUATION COMPLEXITY: Low   GOALS: Goals reviewed with patient? No given time constraints  SHORT TERM GOALS: Target date: 01/04/2023 Pt will demonstrate appropriate understanding and performance of initially prescribed HEP in order to facilitate improved independence with management of symptoms.  Baseline: HEP provided on eval Goal status: INITIAL   2. Pt will score greater than or equal to 60 on FOTO in order to demonstrate improved perception of function due to symptoms.  Baseline: 52  Goal status: INITIAL     LONG TERM GOALS:  Target date: 02/01/2023 Pt will score 67 on FOTO in order to demonstrate improved perception of function due to symptoms. Baseline: 52 Goal status: INITIAL  2.  Pt will demonstrate at least 130 degrees of active shoulder elevation bilaterally in order to demonstrate improved tolerance to functional movement patterns such as upper body dressing and reaching overhead. Baseline: see ROM chart above Goal status: INITIAL  3.  Pt will demonstrate at least 1 pt increase in global shoulder MMT for improved symmetry of UE strength and improved tolerance to functional movements.  Baseline: see MMT chart  above Goal status: INITIAL  4. Pt will report ability to sit for up to 1hr with less than 2 pt increase in pain in order to facilitate improved participation in community activities (theater)  Baseline: up to 4/10 with sitting 20-37min  Goal status: INITIAL   5. Pt will demonstrate grossly symmetrical functional external rotation (within 1 vertebral level) in order to facilitate improved tolerance to self care/ADL tasks.   Baseline: see ROM chart above  Goal status: INITIAL   6. Pt will report at least 50% decrease in overall pain levels in past week in order to facilitate improved tolerance to basic ADLs/mobility.   Baseline: 2-4/10 over past week  Goal status: INITIAL    7. Pt will demonstrate ability to lift up to 30# with less than 2 pt increase in pain in order to facilitate improved tolerance to heavier household tasks.   Baseline: pain reported with 25-40#  Goal status: INITIAL   PLAN:  PT FREQUENCY: 1-2x/week (plan for 2x/week 4 weeks, then 1x/week 4 weeks, 13 visits total including initial evaluation)  PT DURATION: 8 weeks  PLANNED INTERVENTIONS: Therapeutic exercises, Therapeutic activity, Neuromuscular re-education, Balance training, Gait training, Patient/Family education, Self Care, Joint mobilization, Aquatic Therapy, Dry Needling, Electrical stimulation, Spinal mobilization,  Cryotherapy, Moist heat, Taping, Manual therapy, and Re-evaluation  PLAN FOR NEXT SESSION: Review/update HEP PRN. Work on [employer] exercises as appropriate with emphasis on postural extension, periscapular/GH activation, and GH mobility. Symptom modification strategies as indicated/appropriate.    Ashley Murrain PT, DPT 12/07/2022 10:40 AM   Referring diagnosis? M25.511,G89.29,M25.512 (ICD-10-CM) - Chronic pain of both shoulders Treatment diagnosis? (if different than referring diagnosis) Bilateral shoulder pain, unspecified chronicity  Abnormal posture  Muscle weakness (generalized)  Stiffness of right shoulder, not elsewhere classified  Stiffness of left shoulder, not elsewhere classified What was this (referring dx) caused by? []  Surgery []  Fall [x]  Ongoing issue []  Arthritis []  Other: ____________  Laterality: []  Rt []  Lt [x]  Both  Check all possible CPT codes:  *CHOOSE 10 OR LESS*    [x]  97110 (Therapeutic Exercise)  []  92507 (SLP Treatment)  [x]  97112 (Neuro Re-ed)   []  92526 (Swallowing Treatment)   [x]  97116 (Gait Training)   []  [id number] (Cognitive Training, 1st 15 minutes) [x]  97140 (Manual Therapy)   []  97130 (Cognitive Training, each add'l 15 minutes)  [x]  97164 (Re-evaluation)                              []  Other, List CPT Code ____________  [x]  16109 (Therapeutic Activities)     [x]  97535 (Self Care)   [x]  All codes above (97110 - 97535)  []  97012 (Mechanical Traction)  [x]  97014 (E-stim Unattended)  []  97032 (E-stim manual)  []  97033 (Ionto)  []  97035 (Ultrasound) [x]  97750 (Physical Performance Training) []  [id number] (Aquatic Therapy) []  97016 (Vasopneumatic Device) []  [id number] (Paraffin) []  97034 (Contrast Bath) []  97597 (Wound Care 1st 20 sq cm) []  97598 (Wound Care each add'l 20 sq cm) []  97760 (Orthotic Fabrication, Fitting, Training Initial) []  [id number] (Prosthetic Management and Training Initial) []  [id number] (Orthotic or Prosthetic Training/ Modification  Subsequent)

## 2022-12-07 ENCOUNTER — Other Ambulatory Visit: Payer: Self-pay

## 2022-12-07 ENCOUNTER — Ambulatory Visit: Payer: Medicare PPO | Attending: Sports Medicine | Admitting: Physical Therapy

## 2022-12-07 ENCOUNTER — Encounter: Payer: Self-pay | Admitting: Physical Therapy

## 2022-12-07 DIAGNOSIS — M25512 Pain in left shoulder: Secondary | ICD-10-CM | POA: Diagnosis not present

## 2022-12-07 DIAGNOSIS — R293 Abnormal posture: Secondary | ICD-10-CM | POA: Insufficient documentation

## 2022-12-07 DIAGNOSIS — M25611 Stiffness of right shoulder, not elsewhere classified: Secondary | ICD-10-CM | POA: Diagnosis not present

## 2022-12-07 DIAGNOSIS — G8929 Other chronic pain: Secondary | ICD-10-CM | POA: Insufficient documentation

## 2022-12-07 DIAGNOSIS — M6281 Muscle weakness (generalized): Secondary | ICD-10-CM | POA: Diagnosis not present

## 2022-12-07 DIAGNOSIS — M25511 Pain in right shoulder: Secondary | ICD-10-CM | POA: Diagnosis not present

## 2022-12-07 DIAGNOSIS — M25612 Stiffness of left shoulder, not elsewhere classified: Secondary | ICD-10-CM

## 2022-12-14 ENCOUNTER — Ambulatory Visit: Payer: Medicare PPO | Admitting: Physical Therapy

## 2022-12-14 ENCOUNTER — Encounter: Payer: Self-pay | Admitting: Physical Therapy

## 2022-12-14 DIAGNOSIS — M25612 Stiffness of left shoulder, not elsewhere classified: Secondary | ICD-10-CM | POA: Diagnosis not present

## 2022-12-14 DIAGNOSIS — G8929 Other chronic pain: Secondary | ICD-10-CM | POA: Diagnosis not present

## 2022-12-14 DIAGNOSIS — R293 Abnormal posture: Secondary | ICD-10-CM

## 2022-12-14 DIAGNOSIS — M25511 Pain in right shoulder: Secondary | ICD-10-CM | POA: Diagnosis not present

## 2022-12-14 DIAGNOSIS — M25611 Stiffness of right shoulder, not elsewhere classified: Secondary | ICD-10-CM

## 2022-12-14 DIAGNOSIS — M6281 Muscle weakness (generalized): Secondary | ICD-10-CM

## 2022-12-14 DIAGNOSIS — M25512 Pain in left shoulder: Secondary | ICD-10-CM | POA: Diagnosis not present

## 2022-12-14 NOTE — Therapy (Signed)
OUTPATIENT PHYSICAL THERAPY TREATMENT NOTE   Patient Name: Noah Barnes MRN: [id number] DOB:May 26, 1946, 77 y.o., male Today's Date: 12/15/2022  END OF SESSION:  PT End of Session - 12/15/22 1052     Visit Number 3    Number of Visits 13    Date for PT Re-Evaluation 02/01/23    Authorization Type [employer]    Authorization Time Period Approved 12 visits 12/11/22-01/20/23    Authorization - Visit Number 2    Authorization - Number of Visits 12    Progress Note Due on Visit 10    PT Start Time 1057    PT Stop Time 1141    PT Time Calculation (min) 44 min    Activity Tolerance Patient tolerated treatment well    Behavior During Therapy WFL for tasks assessed/performed               Past Medical History:  Diagnosis Date   CHF (congestive heart failure) (HCC)    Chronic diastolic heart failure (HCC)    Constipation    Diabetes mellitus    Diverticulitis    Diverticulosis    ED (erectile dysfunction)    Gout    Heart murmur    Hyperlipidemia    Hypertension    IBS (irritable bowel syndrome)    Renal calculus    Renal insufficiency    Tubular adenoma of colon    Past Surgical History:  Procedure Laterality Date   COLONOSCOPY  1998   negative; no F/U   EXTRACORPOREAL SHOCK WAVE LITHOTRIPSY Left 03/20/2019   Procedure: EXTRACORPOREAL SHOCK WAVE LITHOTRIPSY (ESWL);  Surgeon: Malen Gauze, MD;  Location: [facility];  Service: Urology;  Laterality: Left;   knee effusion tapped     Patient Active Problem List   Diagnosis Date Noted   Acute pain of both shoulders 10/31/2022   Right shoulder pain 10/31/2022   Acute cough 04/19/2021   Right-sided chest pain 04/19/2021   Chest congestion 04/15/2021   Acquired trigger finger of right little finger 12/30/2020   Bilateral primary osteoarthritis of knee 12/30/2020   Aortic atherosclerosis (HCC) 12/29/2020   Chronic bronchitis (HCC) 07/28/2020   BPH (benign prostatic hyperplasia) 07/05/2020   Left lower  quadrant abdominal pain 11/28/2019   Lung nodule - stable 04/04/2019   History of DVT of lower extremity, b/l and PE 04/03/2019   History of pulmonary embolus (PE) 04/03/2019   Hypomagnesemia 04/03/2019   Nephrolithiasis 03/06/2019   Seasonal allergic rhinitis 05/22/2018   MR (mitral regurgitation) 12/20/2017   CKD (chronic kidney disease) stage 3, GFR 30-59 ml/min (HCC) 08/14/2017   Abdominal hernia without obstruction and without gangrene 01/10/2016   Chronic diastolic heart failure (HCC) 02/25/2015   Right renal mass 02/25/2015   Other constipation 02/22/2015   Chronic venous insufficiency 01/18/2015   Superficial phlebitis 01/18/2015   IBS (irritable bowel syndrome) 11/12/2013   Family history of prostate cancer 12/26/2011   Diabetes mellitus with renal complications (HCC) 12/18/2008   Gout 12/31/2007   HYPERLIPIDEMIA 08/14/2007   ERECTILE DYSFUNCTION 05/09/2007   Essential hypertension 11/28/2006    PCP: Pincus Sanes, MD  REFERRING PROVIDER: Richardean Sale, DO  REFERRING DIAG: M25.511,G89.29,M25.512 (ICD-10-CM) - Chronic pain of both shoulders  THERAPY DIAG:  Bilateral shoulder pain, unspecified chronicity  Abnormal posture  Muscle weakness (generalized)  Stiffness of right shoulder, not elsewhere classified  Stiffness of left shoulder, not elsewhere classified  Rationale for Evaluation and Treatment: Rehabilitation  ONSET DATE: at least 6-7 months  SUBJECTIVE STATEMENT: "It's a little bit better today."    PERTINENT HISTORY: CHF, DM, HTN, renal history, hx DVT/PE. Pt states all well controlled  PAIN:  Are you having pain? Yes: NPRS scale: 2/10 Pain location: bilateral shoulders Pain description: sore Aggravating factors: cold air Relieving factors: heat   PRECAUTIONS: cardiac hx, hx DVT/PE  WEIGHT BEARING RESTRICTIONS: No    OBJECTIVE: (objective measures completed at initial evaluation unless otherwise dated)   DIAGNOSTIC FINDINGS:   Refer to [facility] for impression of BIL shoulder XR (11/01/22), no acute issues noted per impressions  PATIENT SURVEYS:  FOTO 52 current, 67 predicted  COGNITION: Overall cognitive status: Within functional limits for tasks assessed     SENSATION: Light touch intact BIL UE   POSTURE: Significant kyphosis and forward head posture, RUE held in more IR than L  UPPER EXTREMITY ROM:  A/PROM Right eval Left eval 12/15/22  Shoulder flexion 100 deg * 108 deg * Lt 138; Rt: 134 in standing   Shoulder abduction 88 deg * 102 deg *   Shoulder internal rotation     Shoulder external rotation (functional combo) Occiput * C6 *   Elbow flexion     Elbow extension     Wrist flexion     Wrist extension      (Blank rows = not tested) (Key: WFL = within functional limits not formally assessed, * = concordant pain, s = stiffness/stretching sensation, NT = not tested)  Comments:    UPPER EXTREMITY MMT:  MMT Right eval Left eval  Shoulder flexion 4- * 4-  Shoulder extension    Shoulder abduction 4 * 4+  Shoulder internal rotation 4- 4  Shoulder external rotation 3+ * 4  Elbow flexion    Elbow extension    Grip strength    (Blank rows = not tested)  (Key: WFL = within functional limits not formally assessed, * = concordant pain, s = stiffness/stretching sensation, NT = not tested)  Comments:   SHOULDER SPECIAL TESTS: NT  JOINT MOBILITY TESTING:  NT  PALPATION:  Concordant tenderness BIL deltoids, infraspinatus, teres minor/major. Generalized tightness upper traps and rhomboids bilat   TODAY'S TREATMENT:                                                                                                                                         [facility] Adult PT Treatment:                                                DATE: 12/15/22 Therapeutic Exercise: Supine shoulder flexion AAROM with dowel x 10 Sidelying shoulder abduction 2 x 10  Sidelying shoulder ER 2 x 10  Pec doorway stretch 2 x 30  sec Pulleys flexion x 2 minutes Resisted rows  blue band 2 x 10  Supine resisted horizontal shoulder abduction red band 2 x 10  Bilateral shoulder ER yellow band 2 x 10  Shoulder flexion towel slides at wall x 5 each; 5 sec hold  Updated HEP     [facility] Adult PT Treatment:                                                DATE: 12/14/22 Therapeutic Exercise: Scapular retraction 2x10 cues for reduced UT compensations  Standing shoulder flexion walkback at counter 2x10 cues for comfortable ROM Supine shoulder flexion AAROM w/ dowel 2x8 cues for comfortable ROM and pacing  Supine chest press w/ dowel rod 2x8 cues for setup RTB bicep curl 2x8 BIL UE cues for posture Tricep pushdown RTB 2x8 BIL UE cues for setup and posture  Standing shoulder flexion iso 2x5 BIL cues for form and appropriate exertion Standing swiss ball GH flexion at table 2x10 cues for reduced UT compensations  Seated shoulder shrugs x12 cues for form, emphasis on eccentric control and scapular depression, cues for breath control    [facility] Adult PT Treatment:                                                DATE: 12/07/22 Therapeutic Exercise: Scapular retractions x10 cues for appropriate mechanics, reduced elbow compensations Standing shoulder flexion walkback at counter, x10 cues for comfortable ROM and appropriate setup HEP handout + education, rationale for interventions and relevant anatomy/physiology   PATIENT EDUCATION: Education details: HEP update Person educated: Patient Education method: Explanation, Demonstration, Tactile cues, Verbal cues, and Handouts Education comprehension: verbalized understanding, returned demonstration, verbal cues required, tactile cues required, and needs further education    HOME EXERCISE PROGRAM: Access Code: [email or web address] URL: [email or web address] Date: 12/07/2022 Prepared by: Fransisco Hertz  Exercises - Standing 'L' Stretch at Counter  - 1 x daily - 7 x weekly - 3 sets - 10  reps - Seated Scapular Retraction  - 1 x daily - 7 x weekly - 3 sets - 10 reps  ASSESSMENT:  CLINICAL IMPRESSION: Patient reports an improvement in his shoulder pain compared to yesterday's visit. Focused on progression of bilateral shoulder ROM and postural strengthening with good tolerance. Shoulder flexion AROM has improved compared to baseline, though remains limited due to pain. He really enjoyed the pulleys stating that this reduced his pain and made him feel less stiff in the shoulders. Intermittent postural cues required throughout session to reduce forward head/rounded shoulders. HEP was updated to include further shoulder strengthening and mobility.   OBJECTIVE IMPAIRMENTS: decreased activity tolerance, decreased endurance, decreased mobility, decreased ROM, decreased strength, hypomobility, impaired UE functional use, postural dysfunction, and pain.   ACTIVITY LIMITATIONS: carrying, lifting, sitting, dressing, and reach over head  PARTICIPATION LIMITATIONS: meal prep, cleaning, and community activity  PERSONAL FACTORS: Age, Time since onset of injury/illness/exacerbation, and 3+ comorbidities: cardiac hx, hx DVT/PE, HTN  are also affecting patient's functional outcome.   REHAB POTENTIAL: Good  CLINICAL DECISION MAKING: Stable/uncomplicated  EVALUATION COMPLEXITY: Low   GOALS: Goals reviewed with patient? No given time constraints  SHORT TERM GOALS: Target date: 01/04/2023 Pt will demonstrate appropriate understanding and performance of initially prescribed HEP in order to facilitate improved  independence with management of symptoms.  Baseline: HEP provided on eval Goal status: INITIAL   2. Pt will score greater than or equal to 60 on FOTO in order to demonstrate improved perception of function due to symptoms.  Baseline: 52  Goal status: INITIAL     LONG TERM GOALS: Target date: 02/01/2023 Pt will score 67 on FOTO in order to demonstrate improved perception of function due  to symptoms. Baseline: 52 Goal status: INITIAL  2.  Pt will demonstrate at least 130 degrees of active shoulder elevation bilaterally in order to demonstrate improved tolerance to functional movement patterns such as upper body dressing and reaching overhead. Baseline: see ROM chart above Goal status: INITIAL  3.  Pt will demonstrate at least 1 pt increase in global shoulder MMT for improved symmetry of UE strength and improved tolerance to functional movements.  Baseline: see MMT chart above Goal status: INITIAL  4. Pt will report ability to sit for up to 1hr with less than 2 pt increase in pain in order to facilitate improved participation in community activities (theater)  Baseline: up to 4/10 with sitting 20-60min  Goal status: INITIAL   5. Pt will demonstrate grossly symmetrical functional external rotation (within 1 vertebral level) in order to facilitate improved tolerance to self care/ADL tasks.   Baseline: see ROM chart above  Goal status: INITIAL   6. Pt will report at least 50% decrease in overall pain levels in past week in order to facilitate improved tolerance to basic ADLs/mobility.   Baseline: 2-4/10 over past week  Goal status: INITIAL    7. Pt will demonstrate ability to lift up to 30# with less than 2 pt increase in pain in order to facilitate improved tolerance to heavier household tasks.   Baseline: pain reported with 25-40#  Goal status: INITIAL   PLAN:  PT FREQUENCY: 1-2x/week (plan for 2x/week 4 weeks, then 1x/week 4 weeks, 13 visits total including initial evaluation)  PT DURATION: 8 weeks  PLANNED INTERVENTIONS: Therapeutic exercises, Therapeutic activity, Neuromuscular re-education, Balance training, Gait training, Patient/Family education, Self Care, Joint mobilization, Aquatic Therapy, Dry Needling, Electrical stimulation, Spinal mobilization, Cryotherapy, Moist heat, Taping, Manual therapy, and Re-evaluation  PLAN FOR NEXT SESSION: Review/update HEP  PRN. Work on [employer] exercises as appropriate with emphasis on postural extension, periscapular/GH activation, and GH mobility. Symptom modification strategies as indicated/appropriate.    Letitia Libra, PT, DPT, ATC 12/15/22 11:41 AM

## 2022-12-14 NOTE — Therapy (Signed)
OUTPATIENT PHYSICAL THERAPY TREATMENT NOTE   Patient Name: Noah Barnes MRN: [id number] DOB:05-15-1946, 77 y.o., male Today's Date: 12/14/2022  END OF SESSION:  PT End of Session - 12/14/22 1359     Visit Number 2    Number of Visits 13    Date for PT Re-Evaluation 02/01/23    Authorization Type [employer]    Authorization Time Period Approved 12 visits 12/11/22-01/20/23    Progress Note Due on Visit 10    PT Start Time 1400    PT Stop Time 1441    PT Time Calculation (min) 41 min    Activity Tolerance Patient tolerated treatment well;No increased pain    Behavior During Therapy WFL for tasks assessed/performed              Past Medical History:  Diagnosis Date   CHF (congestive heart failure) (HCC)    Chronic diastolic heart failure (HCC)    Constipation    Diabetes mellitus    Diverticulitis    Diverticulosis    ED (erectile dysfunction)    Gout    Heart murmur    Hyperlipidemia    Hypertension    IBS (irritable bowel syndrome)    Renal calculus    Renal insufficiency    Tubular adenoma of colon    Past Surgical History:  Procedure Laterality Date   COLONOSCOPY  1998   negative; no F/U   EXTRACORPOREAL SHOCK WAVE LITHOTRIPSY Left 03/20/2019   Procedure: EXTRACORPOREAL SHOCK WAVE LITHOTRIPSY (ESWL);  Surgeon: Malen Gauze, MD;  Location: [facility];  Service: Urology;  Laterality: Left;   knee effusion tapped     Patient Active Problem List   Diagnosis Date Noted   Acute pain of both shoulders 10/31/2022   Right shoulder pain 10/31/2022   Acute cough 04/19/2021   Right-sided chest pain 04/19/2021   Chest congestion 04/15/2021   Acquired trigger finger of right little finger 12/30/2020   Bilateral primary osteoarthritis of knee 12/30/2020   Aortic atherosclerosis (HCC) 12/29/2020   Chronic bronchitis (HCC) 07/28/2020   BPH (benign prostatic hyperplasia) 07/05/2020   Left lower quadrant abdominal pain 11/28/2019   Lung nodule - stable  04/04/2019   History of DVT of lower extremity, b/l and PE 04/03/2019   History of pulmonary embolus (PE) 04/03/2019   Hypomagnesemia 04/03/2019   Nephrolithiasis 03/06/2019   Seasonal allergic rhinitis 05/22/2018   MR (mitral regurgitation) 12/20/2017   CKD (chronic kidney disease) stage 3, GFR 30-59 ml/min (HCC) 08/14/2017   Abdominal hernia without obstruction and without gangrene 01/10/2016   Chronic diastolic heart failure (HCC) 02/25/2015   Right renal mass 02/25/2015   Other constipation 02/22/2015   Chronic venous insufficiency 01/18/2015   Superficial phlebitis 01/18/2015   IBS (irritable bowel syndrome) 11/12/2013   Family history of prostate cancer 12/26/2011   Diabetes mellitus with renal complications (HCC) 12/18/2008   Gout 12/31/2007   HYPERLIPIDEMIA 08/14/2007   ERECTILE DYSFUNCTION 05/09/2007   Essential hypertension 11/28/2006    PCP: Pincus Sanes, MD  REFERRING PROVIDER: Richardean Sale, DO  REFERRING DIAG: M25.511,G89.29,M25.512 (ICD-10-CM) - Chronic pain of both shoulders  THERAPY DIAG:  Bilateral shoulder pain, unspecified chronicity  Abnormal posture  Muscle weakness (generalized)  Stiffness of right shoulder, not elsewhere classified  Stiffness of left shoulder, not elsewhere classified  Rationale for Evaluation and Treatment: Rehabilitation  ONSET DATE: at least 6-7 months  SUBJECTIVE:  Per eval - Pt states he has been diagnosed with shoulder arthritis. Gradual onset, worsening over past few months. Worst in the morning and with cold weather, lack of movement. Difficulty with lifting movements (greater than 25-40#). Some difficulty with upper body dressing.  Used to be fairly active, going to [employer], walking. Wants to get back into [facility] for the whirlpool.  No N/T or  swelling. Does occasionally refer some pain into lateral RUE toward elbow, noting distally.  Hand dominance: Ambidextrous (generally right handed, uses L hand for athletics)  SUBJECTIVE STATEMENT: 12/14/2022 Pt states he had mild soreness after eval but nothing significant, HEP going well without issue, feels some transient relief.     PERTINENT HISTORY: CHF, DM, HTN, renal history, hx DVT/PE. Pt states all well controlled  PAIN:  Are you having pain:  3/10 Location/description: R shoulder mostly, more mild in L   Per eval -  Best-worst over past week: 2-4/10  - aggravating factors: cold, sitting 20-63min, reaching overhead, lifting - Easing factors: movement, heat (especially hot showers), warm lotion, tylenol  PRECAUTIONS: cardiac hx, hx DVT/PE  WEIGHT BEARING RESTRICTIONS: No  FALLS:  Has patient fallen in last 6 months? No  LIVING ENVIRONMENT: 1 story house, basement Lives with spouse who works, pt is retired  OCCUPATION: Worked Building surveyor for 43 years, retired; used to Psychologist, occupational basketball  PLOF: Independent   PATIENT GOALS: be able to do regular activities  NEXT MD VISIT: July 10th Dr. Jean Rosenthal  OBJECTIVE: (objective measures completed at initial evaluation unless otherwise dated)   DIAGNOSTIC FINDINGS:  Refer to [facility] for impression of BIL shoulder XR (11/01/22), no acute issues noted per impressions  PATIENT SURVEYS:  FOTO 52 current, 67 predicted  COGNITION: Overall cognitive status: Within functional limits for tasks assessed     SENSATION: Light touch intact BIL UE   POSTURE: Significant kyphosis and forward head posture, RUE held in more IR than L  UPPER EXTREMITY ROM:  A/PROM Right eval Left eval  Shoulder flexion 100 deg * 108 deg *  Shoulder abduction 88 deg * 102 deg *  Shoulder internal rotation    Shoulder external rotation (functional combo) Occiput * C6 *  Elbow flexion    Elbow extension    Wrist flexion    Wrist extension      (Blank rows = not tested) (Key: WFL = within functional limits not formally assessed, * = concordant pain, s = stiffness/stretching sensation, NT = not tested)  Comments:    UPPER EXTREMITY MMT:  MMT Right eval Left eval  Shoulder flexion 4- * 4-  Shoulder extension    Shoulder abduction 4 * 4+  Shoulder internal rotation 4- 4  Shoulder external rotation 3+ * 4  Elbow flexion    Elbow extension    Grip strength    (Blank rows = not tested)  (Key: WFL = within functional limits not formally assessed, * = concordant pain, s = stiffness/stretching sensation, NT = not tested)  Comments:   SHOULDER SPECIAL TESTS: NT  JOINT MOBILITY TESTING:  NT  PALPATION:  Concordant tenderness BIL deltoids, infraspinatus, teres minor/major. Generalized tightness upper traps and rhomboids bilat   TODAY'S TREATMENT:  [facility] Adult PT Treatment:                                                DATE: 12/14/22 Therapeutic Exercise: Scapular retraction 2x10 cues for reduced UT compensations  Standing shoulder flexion walkback at counter 2x10 cues for comfortable ROM Supine shoulder flexion AAROM w/ dowel 2x8 cues for comfortable ROM and pacing  Supine chest press w/ dowel rod 2x8 cues for setup RTB bicep curl 2x8 BIL UE cues for posture Tricep pushdown RTB 2x8 BIL UE cues for setup and posture  Standing shoulder flexion iso 2x5 BIL cues for form and appropriate exertion Standing swiss ball GH flexion at table 2x10 cues for reduced UT compensations  Seated shoulder shrugs x12 cues for form, emphasis on eccentric control and scapular depression, cues for breath control    [facility] Adult PT Treatment:                                                DATE: 12/07/22 Therapeutic Exercise: Scapular retractions x10 cues for appropriate mechanics, reduced elbow compensations Standing  shoulder flexion walkback at counter, x10 cues for comfortable ROM and appropriate setup HEP handout + education, rationale for interventions and relevant anatomy/physiology   PATIENT EDUCATION: Education details: rationale for interventions, relevant anatomy/physiology, HEP  Person educated: Patient Education method: Explanation, Demonstration, Tactile cues, Verbal cues, and Handouts Education comprehension: verbalized understanding, returned demonstration, verbal cues required, tactile cues required, and needs further education    HOME EXERCISE PROGRAM: Access Code: [email or web address] URL: [email or web address] Date: 12/07/2022 Prepared by: Fransisco Hertz  Exercises - Standing 'L' Stretch at Counter  - 1 x daily - 7 x weekly - 3 sets - 10 reps - Seated Scapular Retraction  - 1 x daily - 7 x weekly - 3 sets - 10 reps  ASSESSMENT:  CLINICAL IMPRESSION: 12/14/2022 Pt arrives w/ 3/10 R>L shoulder pain, no issues since initial eval. Today initiated with HEP review, pt does well although does require min cues for reduced UT compensations. Following with exercises working on postural extension and GH mobility. Pt demonstrates consistent compensations with upper traps for majority of movements, this is addressed with cues as above and exercise at end of session emphasizing scapular depression. No adverse events, pt departs with 2/10 pain R shoulder only and improved stiffness. Recommend continuing along current POC in order to address relevant deficits and improve functional tolerance. Pt departs today's session in no acute distress, all voiced questions/concerns addressed appropriately from PT perspective.    Per eval - Pt is a pleasant 77 year old gentleman who arrives to PT evaluation on this date for BIL shoulder pain ongoing for past several months. Pt reports difficulty with lifting, raising arms overhead, and morning activites due to pain. During today's session pt demonstrates postural  deficits as above, limitations in BIL GH mobility and strength (R more affected than L) which are likely contributing to difficulty with aforementioned activities. Recommend skilled PT to address aforementioned deficits to improve functional independence/tolerance. No adverse events, tolerates HEP well with emphasis on improving postural extension for preparation into GH overhead movements, pt endorses improved symptoms after GH flexion stretch. Pt departs today's session in no acute distress, all voiced questions/concerns addressed  appropriately from PT perspective.    OBJECTIVE IMPAIRMENTS: decreased activity tolerance, decreased endurance, decreased mobility, decreased ROM, decreased strength, hypomobility, impaired UE functional use, postural dysfunction, and pain.   ACTIVITY LIMITATIONS: carrying, lifting, sitting, dressing, and reach over head  PARTICIPATION LIMITATIONS: meal prep, cleaning, and community activity  PERSONAL FACTORS: Age, Time since onset of injury/illness/exacerbation, and 3+ comorbidities: cardiac hx, hx DVT/PE, HTN  are also affecting patient's functional outcome.   REHAB POTENTIAL: Good  CLINICAL DECISION MAKING: Stable/uncomplicated  EVALUATION COMPLEXITY: Low   GOALS: Goals reviewed with patient? No given time constraints  SHORT TERM GOALS: Target date: 01/04/2023 Pt will demonstrate appropriate understanding and performance of initially prescribed HEP in order to facilitate improved independence with management of symptoms.  Baseline: HEP provided on eval Goal status: INITIAL   2. Pt will score greater than or equal to 60 on FOTO in order to demonstrate improved perception of function due to symptoms.  Baseline: 52  Goal status: INITIAL     LONG TERM GOALS: Target date: 02/01/2023 Pt will score 67 on FOTO in order to demonstrate improved perception of function due to symptoms. Baseline: 52 Goal status: INITIAL  2.  Pt will demonstrate at least 130 degrees  of active shoulder elevation bilaterally in order to demonstrate improved tolerance to functional movement patterns such as upper body dressing and reaching overhead. Baseline: see ROM chart above Goal status: INITIAL  3.  Pt will demonstrate at least 1 pt increase in global shoulder MMT for improved symmetry of UE strength and improved tolerance to functional movements.  Baseline: see MMT chart above Goal status: INITIAL  4. Pt will report ability to sit for up to 1hr with less than 2 pt increase in pain in order to facilitate improved participation in community activities (theater)  Baseline: up to 4/10 with sitting 20-59min  Goal status: INITIAL   5. Pt will demonstrate grossly symmetrical functional external rotation (within 1 vertebral level) in order to facilitate improved tolerance to self care/ADL tasks.   Baseline: see ROM chart above  Goal status: INITIAL   6. Pt will report at least 50% decrease in overall pain levels in past week in order to facilitate improved tolerance to basic ADLs/mobility.   Baseline: 2-4/10 over past week  Goal status: INITIAL    7. Pt will demonstrate ability to lift up to 30# with less than 2 pt increase in pain in order to facilitate improved tolerance to heavier household tasks.   Baseline: pain reported with 25-40#  Goal status: INITIAL   PLAN:  PT FREQUENCY: 1-2x/week (plan for 2x/week 4 weeks, then 1x/week 4 weeks, 13 visits total including initial evaluation)  PT DURATION: 8 weeks  PLANNED INTERVENTIONS: Therapeutic exercises, Therapeutic activity, Neuromuscular re-education, Balance training, Gait training, Patient/Family education, Self Care, Joint mobilization, Aquatic Therapy, Dry Needling, Electrical stimulation, Spinal mobilization, Cryotherapy, Moist heat, Taping, Manual therapy, and Re-evaluation  PLAN FOR NEXT SESSION: Review/update HEP PRN. Work on [employer] exercises as appropriate with emphasis on postural extension,  periscapular/GH activation, and GH mobility. Symptom modification strategies as indicated/appropriate.     Ashley Murrain PT, DPT 12/14/2022 2:44 PM

## 2022-12-15 ENCOUNTER — Ambulatory Visit: Payer: Medicare PPO

## 2022-12-15 DIAGNOSIS — R293 Abnormal posture: Secondary | ICD-10-CM | POA: Diagnosis not present

## 2022-12-15 DIAGNOSIS — M25612 Stiffness of left shoulder, not elsewhere classified: Secondary | ICD-10-CM | POA: Diagnosis not present

## 2022-12-15 DIAGNOSIS — M25611 Stiffness of right shoulder, not elsewhere classified: Secondary | ICD-10-CM

## 2022-12-15 DIAGNOSIS — M25511 Pain in right shoulder: Secondary | ICD-10-CM | POA: Diagnosis not present

## 2022-12-15 DIAGNOSIS — M6281 Muscle weakness (generalized): Secondary | ICD-10-CM | POA: Diagnosis not present

## 2022-12-15 DIAGNOSIS — M25512 Pain in left shoulder: Secondary | ICD-10-CM | POA: Diagnosis not present

## 2022-12-15 DIAGNOSIS — G8929 Other chronic pain: Secondary | ICD-10-CM | POA: Diagnosis not present

## 2022-12-18 ENCOUNTER — Other Ambulatory Visit: Payer: Self-pay | Admitting: Hematology & Oncology

## 2022-12-18 DIAGNOSIS — Z86711 Personal history of pulmonary embolism: Secondary | ICD-10-CM

## 2022-12-18 DIAGNOSIS — Z86718 Personal history of other venous thrombosis and embolism: Secondary | ICD-10-CM

## 2022-12-19 ENCOUNTER — Telehealth: Payer: Self-pay

## 2022-12-19 NOTE — Telephone Encounter (Signed)
Unsuccessful attempt to reach patient on preferred number listed in notes for scheduled AWV. Unable to leave a voicemail. 

## 2022-12-21 ENCOUNTER — Ambulatory Visit: Payer: Medicare PPO

## 2022-12-21 DIAGNOSIS — G8929 Other chronic pain: Secondary | ICD-10-CM | POA: Diagnosis not present

## 2022-12-21 DIAGNOSIS — M6281 Muscle weakness (generalized): Secondary | ICD-10-CM | POA: Diagnosis not present

## 2022-12-21 DIAGNOSIS — M25611 Stiffness of right shoulder, not elsewhere classified: Secondary | ICD-10-CM | POA: Diagnosis not present

## 2022-12-21 DIAGNOSIS — R293 Abnormal posture: Secondary | ICD-10-CM | POA: Diagnosis not present

## 2022-12-21 DIAGNOSIS — M25512 Pain in left shoulder: Secondary | ICD-10-CM | POA: Diagnosis not present

## 2022-12-21 DIAGNOSIS — M25511 Pain in right shoulder: Secondary | ICD-10-CM | POA: Diagnosis not present

## 2022-12-21 DIAGNOSIS — M25612 Stiffness of left shoulder, not elsewhere classified: Secondary | ICD-10-CM

## 2022-12-21 NOTE — Therapy (Signed)
OUTPATIENT PHYSICAL THERAPY TREATMENT NOTE   Patient Name: Noah Barnes MRN: [id number] DOB:1945-11-08, 77 y.o., male Today's Date: 12/21/2022  END OF SESSION:  PT End of Session - 12/21/22 0844     Visit Number 4    Number of Visits 13    Date for PT Re-Evaluation 02/01/23    Authorization Type [employer]    Authorization Time Period Approved 12 visits 12/11/22-01/20/23    Authorization - Visit Number 3    Authorization - Number of Visits 12    Progress Note Due on Visit 10    PT Start Time 0845    PT Stop Time 0926    PT Time Calculation (min) 41 min    Activity Tolerance Patient tolerated treatment well    Behavior During Therapy [facility] for tasks assessed/performed               Past Medical History:  Diagnosis Date   CHF (congestive heart failure) (HCC)    Chronic diastolic heart failure (HCC)    Constipation    Diabetes mellitus    Diverticulitis    Diverticulosis    ED (erectile dysfunction)    Gout    Heart murmur    Hyperlipidemia    Hypertension    IBS (irritable bowel syndrome)    Renal calculus    Renal insufficiency    Tubular adenoma of colon    Past Surgical History:  Procedure Laterality Date   COLONOSCOPY  1998   negative; no F/U   EXTRACORPOREAL SHOCK WAVE LITHOTRIPSY Left 03/20/2019   Procedure: EXTRACORPOREAL SHOCK WAVE LITHOTRIPSY (ESWL);  Surgeon: Malen Gauze, MD;  Location: [facility];  Service: Urology;  Laterality: Left;   knee effusion tapped     Patient Active Problem List   Diagnosis Date Noted   Acute pain of both shoulders 10/31/2022   Right shoulder pain 10/31/2022   Acute cough 04/19/2021   Right-sided chest pain 04/19/2021   Chest congestion 04/15/2021   Acquired trigger finger of right little finger 12/30/2020   Bilateral primary osteoarthritis of knee 12/30/2020   Aortic atherosclerosis (HCC) 12/29/2020   Chronic bronchitis (HCC) 07/28/2020   BPH (benign prostatic hyperplasia) 07/05/2020   Left lower  quadrant abdominal pain 11/28/2019   Lung nodule - stable 04/04/2019   History of DVT of lower extremity, b/l and PE 04/03/2019   History of pulmonary embolus (PE) 04/03/2019   Hypomagnesemia 04/03/2019   Nephrolithiasis 03/06/2019   Seasonal allergic rhinitis 05/22/2018   MR (mitral regurgitation) 12/20/2017   CKD (chronic kidney disease) stage 3, GFR 30-59 ml/min (HCC) 08/14/2017   Abdominal hernia without obstruction and without gangrene 01/10/2016   Chronic diastolic heart failure (HCC) 02/25/2015   Right renal mass 02/25/2015   Other constipation 02/22/2015   Chronic venous insufficiency 01/18/2015   Superficial phlebitis 01/18/2015   IBS (irritable bowel syndrome) 11/12/2013   Family history of prostate cancer 12/26/2011   Diabetes mellitus with renal complications (HCC) 12/18/2008   Gout 12/31/2007   HYPERLIPIDEMIA 08/14/2007   ERECTILE DYSFUNCTION 05/09/2007   Essential hypertension 11/28/2006    PCP: Pincus Sanes, MD  REFERRING PROVIDER: Richardean Sale, DO  REFERRING DIAG: M25.511,G89.29,M25.512 (ICD-10-CM) - Chronic pain of both shoulders  THERAPY DIAG:  Bilateral shoulder pain, unspecified chronicity  Abnormal posture  Muscle weakness (generalized)  Stiffness of right shoulder, not elsewhere classified  Stiffness of left shoulder, not elsewhere classified  Rationale for Evaluation and Treatment: Rehabilitation  ONSET DATE: at least 6-7 months  SUBJECTIVE STATEMENT: "It's a little sore today."   PERTINENT HISTORY: CHF, DM, HTN, renal history, hx DVT/PE. Pt states all well controlled  PAIN:  Are you having pain? Yes: NPRS scale: 3/10 Pain location: bilateral shoulders Pain description: sore Aggravating factors: cold air Relieving factors: heat   PRECAUTIONS: cardiac hx, hx DVT/PE  WEIGHT BEARING RESTRICTIONS: No    OBJECTIVE: (objective measures completed at initial evaluation unless otherwise dated)   DIAGNOSTIC FINDINGS:  Refer to  [facility] for impression of BIL shoulder XR (11/01/22), no acute issues noted per impressions  PATIENT SURVEYS:  FOTO 52 current, 67 predicted  COGNITION: Overall cognitive status: Within functional limits for tasks assessed     SENSATION: Light touch intact BIL UE   POSTURE: Significant kyphosis and forward head posture, RUE held in more IR than L  UPPER EXTREMITY ROM:  A/PROM Right eval Left eval 12/15/22 12/21/22  Shoulder flexion 100 deg * 108 deg * Lt 138; Rt: 134 in standing  Rt: 150; Lt: 153 in standing   Shoulder abduction 88 deg * 102 deg *    Shoulder internal rotation      Shoulder external rotation (functional combo) Occiput * C6 *    Elbow flexion      Elbow extension      Wrist flexion      Wrist extension       (Blank rows = not tested) (Key: WFL = within functional limits not formally assessed, * = concordant pain, s = stiffness/stretching sensation, NT = not tested)  Comments:    UPPER EXTREMITY MMT:  MMT Right eval Left eval  Shoulder flexion 4- * 4-  Shoulder extension    Shoulder abduction 4 * 4+  Shoulder internal rotation 4- 4  Shoulder external rotation 3+ * 4  Elbow flexion    Elbow extension    Grip strength    (Blank rows = not tested)  (Key: WFL = within functional limits not formally assessed, * = concordant pain, s = stiffness/stretching sensation, NT = not tested)  Comments:   SHOULDER SPECIAL TESTS: NT  JOINT MOBILITY TESTING:  NT  PALPATION:  Concordant tenderness BIL deltoids, infraspinatus, teres minor/major. Generalized tightness upper traps and rhomboids bilat   TODAY'S TREATMENT:                                                                                                                                         [facility] Adult PT Treatment:                                                DATE: 12/21/22 Therapeutic Exercise: Pulleys flexion/scaption x 2 minutes each  Supine shoulder flexion AAROM with dowel x 10  Sidelying ER 2 x 10  @ 1  lb Sidelying shoulder abduction 2 x 10 @ 1 lb  Resisted shoulder extension yellow band 2 x 15  Resisted rows green band 2 x 15 Physioball rollout x 1 minute    [facility] Adult PT Treatment:                                                DATE: 12/15/22 Therapeutic Exercise: Supine shoulder flexion AAROM with dowel x 10 Sidelying shoulder abduction 2 x 10  Sidelying shoulder ER 2 x 10  Pec doorway stretch 2 x 30 sec Pulleys flexion x 2 minutes Resisted rows blue band 2 x 10  Supine resisted horizontal shoulder abduction red band 2 x 10  Bilateral shoulder ER yellow band 2 x 10  Shoulder flexion towel slides at wall x 5 each; 5 sec hold  Updated HEP     [facility] Adult PT Treatment:                                                DATE: 12/14/22 Therapeutic Exercise: Scapular retraction 2x10 cues for reduced UT compensations  Standing shoulder flexion walkback at counter 2x10 cues for comfortable ROM Supine shoulder flexion AAROM w/ dowel 2x8 cues for comfortable ROM and pacing  Supine chest press w/ dowel rod 2x8 cues for setup RTB bicep curl 2x8 BIL UE cues for posture Tricep pushdown RTB 2x8 BIL UE cues for setup and posture  Standing shoulder flexion iso 2x5 BIL cues for form and appropriate exertion Standing swiss ball GH flexion at table 2x10 cues for reduced UT compensations  Seated shoulder shrugs x12 cues for form, emphasis on eccentric control and scapular depression, cues for breath control    PATIENT EDUCATION: Education details: HEP review Person educated: Patient Education method: Explanation Education comprehension: verbalized understanding  HOME EXERCISE PROGRAM: Access Code: [email or web address] URL: [email or web address] Date: 12/07/2022 Prepared by: Fransisco Hertz  Exercises - Standing 'L' Stretch at Counter  - 1 x daily - 7 x weekly - 3 sets - 10 reps - Seated Scapular Retraction  - 1 x daily - 7 x weekly - 3 sets - 10 reps  ASSESSMENT:  CLINICAL  IMPRESSION: Patient arrives with mild soreness in bilateral shoulders. Continued emphasis on shoulder mobility and strengthening with good tolerance. He requires consistent cues to reduce shoulder shrug with strengthening with ability to correct once cued. Shoulder flexion AROM continues to improve, LTG# 2 met. No changes made to HEP at this visit.   OBJECTIVE IMPAIRMENTS: decreased activity tolerance, decreased endurance, decreased mobility, decreased ROM, decreased strength, hypomobility, impaired UE functional use, postural dysfunction, and pain.   ACTIVITY LIMITATIONS: carrying, lifting, sitting, dressing, and reach over head  PARTICIPATION LIMITATIONS: meal prep, cleaning, and community activity  PERSONAL FACTORS: Age, Time since onset of injury/illness/exacerbation, and 3+ comorbidities: cardiac hx, hx DVT/PE, HTN  are also affecting patient's functional outcome.   REHAB POTENTIAL: Good  CLINICAL DECISION MAKING: Stable/uncomplicated  EVALUATION COMPLEXITY: Low   GOALS: Goals reviewed with patient? No given time constraints  SHORT TERM GOALS: Target date: 01/04/2023 Pt will demonstrate appropriate understanding and performance of initially prescribed HEP in order to facilitate improved independence with management of symptoms.  Baseline: HEP provided on eval Goal status: met  2. Pt will score greater than or equal to 60 on FOTO in order to demonstrate improved perception of function due to symptoms.  Baseline: 52  Goal status: INITIAL     LONG TERM GOALS: Target date: 02/01/2023 Pt will score 67 on FOTO in order to demonstrate improved perception of function due to symptoms. Baseline: 52 Goal status: INITIAL  2.  Pt will demonstrate at least 130 degrees of active shoulder elevation bilaterally in order to demonstrate improved tolerance to functional movement patterns such as upper body dressing and reaching overhead. Baseline: see ROM chart above Goal status: met  3.  Pt  will demonstrate at least 1 pt increase in global shoulder MMT for improved symmetry of UE strength and improved tolerance to functional movements.  Baseline: see MMT chart above Goal status: INITIAL  4. Pt will report ability to sit for up to 1hr with less than 2 pt increase in pain in order to facilitate improved participation in community activities (theater)  Baseline: up to 4/10 with sitting 20-6min  Goal status: INITIAL   5. Pt will demonstrate grossly symmetrical functional external rotation (within 1 vertebral level) in order to facilitate improved tolerance to self care/ADL tasks.   Baseline: see ROM chart above  Goal status: INITIAL   6. Pt will report at least 50% decrease in overall pain levels in past week in order to facilitate improved tolerance to basic ADLs/mobility.   Baseline: 2-4/10 over past week  Goal status: INITIAL    7. Pt will demonstrate ability to lift up to 30# with less than 2 pt increase in pain in order to facilitate improved tolerance to heavier household tasks.   Baseline: pain reported with 25-40#  Goal status: INITIAL   PLAN:  PT FREQUENCY: 1-2x/week (plan for 2x/week 4 weeks, then 1x/week 4 weeks, 13 visits total including initial evaluation)  PT DURATION: 8 weeks  PLANNED INTERVENTIONS: Therapeutic exercises, Therapeutic activity, Neuromuscular re-education, Balance training, Gait training, Patient/Family education, Self Care, Joint mobilization, Aquatic Therapy, Dry Needling, Electrical stimulation, Spinal mobilization, Cryotherapy, Moist heat, Taping, Manual therapy, and Re-evaluation  PLAN FOR NEXT SESSION: Review/update HEP PRN. Work on [employer] exercises as appropriate with emphasis on postural extension, periscapular/GH activation, and GH mobility. Symptom modification strategies as indicated/appropriate.    Letitia Libra, PT, DPT, ATC 12/21/22 9:27 AM

## 2022-12-25 DIAGNOSIS — N1831 Chronic kidney disease, stage 3a: Secondary | ICD-10-CM | POA: Diagnosis not present

## 2022-12-25 NOTE — Therapy (Signed)
OUTPATIENT PHYSICAL THERAPY TREATMENT NOTE   Patient Name: Noah Barnes MRN: [id number] DOB:09/13/1945, 77 y.o., male Today's Date: 12/26/2022  END OF SESSION:  PT End of Session - 12/26/22 1016     Visit Number 5    Number of Visits 13    Date for PT Re-Evaluation 02/01/23    Authorization Type [employer]    Authorization Time Period Approved 12 visits 12/11/22-01/20/23    Authorization - Visit Number 4    Authorization - Number of Visits 12    Progress Note Due on Visit 10    PT Start Time 1016    PT Stop Time 1056    PT Time Calculation (min) 40 min    Activity Tolerance Patient tolerated treatment well    Behavior During Therapy WFL for tasks assessed/performed                Past Medical History:  Diagnosis Date   CHF (congestive heart failure) (HCC)    Chronic diastolic heart failure (HCC)    Constipation    Diabetes mellitus    Diverticulitis    Diverticulosis    ED (erectile dysfunction)    Gout    Heart murmur    Hyperlipidemia    Hypertension    IBS (irritable bowel syndrome)    Renal calculus    Renal insufficiency    Tubular adenoma of colon    Past Surgical History:  Procedure Laterality Date   COLONOSCOPY  1998   negative; no F/U   EXTRACORPOREAL SHOCK WAVE LITHOTRIPSY Left 03/20/2019   Procedure: EXTRACORPOREAL SHOCK WAVE LITHOTRIPSY (ESWL);  Surgeon: Malen Gauze, MD;  Location: [facility];  Service: Urology;  Laterality: Left;   knee effusion tapped     Patient Active Problem List   Diagnosis Date Noted   Acute pain of both shoulders 10/31/2022   Right shoulder pain 10/31/2022   Acute cough 04/19/2021   Right-sided chest pain 04/19/2021   Chest congestion 04/15/2021   Acquired trigger finger of right little finger 12/30/2020   Bilateral primary osteoarthritis of knee 12/30/2020   Aortic atherosclerosis (HCC) 12/29/2020   Chronic bronchitis (HCC) 07/28/2020   BPH (benign prostatic hyperplasia) 07/05/2020   Left lower  quadrant abdominal pain 11/28/2019   Lung nodule - stable 04/04/2019   History of DVT of lower extremity, b/l and PE 04/03/2019   History of pulmonary embolus (PE) 04/03/2019   Hypomagnesemia 04/03/2019   Nephrolithiasis 03/06/2019   Seasonal allergic rhinitis 05/22/2018   MR (mitral regurgitation) 12/20/2017   CKD (chronic kidney disease) stage 3, GFR 30-59 ml/min (HCC) 08/14/2017   Abdominal hernia without obstruction and without gangrene 01/10/2016   Chronic diastolic heart failure (HCC) 02/25/2015   Right renal mass 02/25/2015   Other constipation 02/22/2015   Chronic venous insufficiency 01/18/2015   Superficial phlebitis 01/18/2015   IBS (irritable bowel syndrome) 11/12/2013   Family history of prostate cancer 12/26/2011   Diabetes mellitus with renal complications (HCC) 12/18/2008   Gout 12/31/2007   HYPERLIPIDEMIA 08/14/2007   ERECTILE DYSFUNCTION 05/09/2007   Essential hypertension 11/28/2006    PCP: Pincus Sanes, MD  REFERRING PROVIDER: Richardean Sale, DO  REFERRING DIAG: M25.511,G89.29,M25.512 (ICD-10-CM) - Chronic pain of both shoulders  THERAPY DIAG:  Bilateral shoulder pain, unspecified chronicity  Abnormal posture  Muscle weakness (generalized)  Stiffness of right shoulder, not elsewhere classified  Stiffness of left shoulder, not elsewhere classified  Rationale for Evaluation and Treatment: Rehabilitation  ONSET DATE: at least 6-7 months  SUBJECTIVE STATEMENT: 2/10 pain at present, states R shoulder remains a little weak. No issues after last session     PERTINENT HISTORY: CHF, DM, HTN, renal history, hx DVT/PE. Pt states all well controlled  PAIN:  Are you having pain? Yes: NPRS scale: 2/10 Pain location: bilateral shoulders Pain description: sore Aggravating factors: cold air Relieving factors: heat   PRECAUTIONS: cardiac hx, hx DVT/PE  WEIGHT BEARING RESTRICTIONS: No    OBJECTIVE: (objective measures completed at initial  evaluation unless otherwise dated)   DIAGNOSTIC FINDINGS:  Refer to [facility] for impression of BIL shoulder XR (11/01/22), no acute issues noted per impressions  PATIENT SURVEYS:  FOTO 52 current, 67 predicted  COGNITION: Overall cognitive status: Within functional limits for tasks assessed     SENSATION: Light touch intact BIL UE   POSTURE: Significant kyphosis and forward head posture, RUE held in more IR than L  UPPER EXTREMITY ROM:  A/PROM Right eval Left eval 12/15/22 12/21/22  Shoulder flexion 100 deg * 108 deg * Lt 138; Rt: 134 in standing  Rt: 150; Lt: 153 in standing   Shoulder abduction 88 deg * 102 deg *    Shoulder internal rotation      Shoulder external rotation (functional combo) Occiput * C6 *    Elbow flexion      Elbow extension      Wrist flexion      Wrist extension       (Blank rows = not tested) (Key: WFL = within functional limits not formally assessed, * = concordant pain, s = stiffness/stretching sensation, NT = not tested)  Comments:    UPPER EXTREMITY MMT:  MMT Right eval Left eval  Shoulder flexion 4- * 4-  Shoulder extension    Shoulder abduction 4 * 4+  Shoulder internal rotation 4- 4  Shoulder external rotation 3+ * 4  Elbow flexion    Elbow extension    Grip strength    (Blank rows = not tested)  (Key: WFL = within functional limits not formally assessed, * = concordant pain, s = stiffness/stretching sensation, NT = not tested)  Comments:   SHOULDER SPECIAL TESTS: NT  JOINT MOBILITY TESTING:  NT  PALPATION:  Concordant tenderness BIL deltoids, infraspinatus, teres minor/major. Generalized tightness upper traps and rhomboids bilat   TODAY'S TREATMENT:                                                                                                                                         [facility] Adult PT Treatment:                                                DATE: 12/26/22 Therapeutic Exercise: Pulley flexion/scaption each cues  for comfortable ROM  Supine  GH flexion AAROM w/ dowel + 1# 2x10 cues for comfortable ROM  Supine GH ER fall outs at 45 deg abd 2x10 RUE only cues for reduced elbow compensations Sidelying ER 2x12 BIL 1# cues for shoulder positioning and appropriate ROM Sidelying GH abduction 1# 2x10 LUE only (discontinued on R due to discomfort in lateral shoulder) Standing green band rows x15 cues for setup, blue band x10 cues for posture  Physioball rollout 2x90min cues for comfortable ROM HEP update/education   [facility] Adult PT Treatment:                                                DATE: 12/21/22 Therapeutic Exercise: Pulleys flexion/scaption x 2 minutes each  Supine shoulder flexion AAROM with dowel x 10  Sidelying ER 2 x 10 @ 1 lb Sidelying shoulder abduction 2 x 10 @ 1 lb  Resisted shoulder extension yellow band 2 x 15  Resisted rows green band 2 x 15 Physioball rollout x 1 minute    [facility] Adult PT Treatment:                                                DATE: 12/15/22 Therapeutic Exercise: Supine shoulder flexion AAROM with dowel x 10 Sidelying shoulder abduction 2 x 10  Sidelying shoulder ER 2 x 10  Pec doorway stretch 2 x 30 sec Pulleys flexion x 2 minutes Resisted rows blue band 2 x 10  Supine resisted horizontal shoulder abduction red band 2 x 10  Bilateral shoulder ER yellow band 2 x 10  Shoulder flexion towel slides at wall x 5 each; 5 sec hold  Updated HEP     [facility] Adult PT Treatment:                                                DATE: 12/14/22 Therapeutic Exercise: Scapular retraction 2x10 cues for reduced UT compensations  Standing shoulder flexion walkback at counter 2x10 cues for comfortable ROM Supine shoulder flexion AAROM w/ dowel 2x8 cues for comfortable ROM and pacing  Supine chest press w/ dowel rod 2x8 cues for setup RTB bicep curl 2x8 BIL UE cues for posture Tricep pushdown RTB 2x8 BIL UE cues for setup and posture  Standing shoulder flexion iso 2x5 BIL cues for  form and appropriate exertion Standing swiss ball GH flexion at table 2x10 cues for reduced UT compensations  Seated shoulder shrugs x12 cues for form, emphasis on eccentric control and scapular depression, cues for breath control    PATIENT EDUCATION: Education details: rationale for interventions, HEP Person educated: Patient Education method: Explanation Education comprehension: verbalized understanding  HOME EXERCISE PROGRAM: Access Code: [email or web address] URL: [email or web address] Date: 12/26/2022 Prepared by: Fransisco Hertz  Exercises - Standing 'L' Stretch at Counter  - 1 x daily - 7 x weekly - 3 sets - 10 reps - Doorway Pec Stretch at 60 Elevation  - 1 x daily - 7 x weekly - 3 sets - 30 sec hold - Supine Shoulder Flexion Extension AAROM with Dowel  - 1 x daily - 7 x weekly - 2 sets -  15 reps - Standing Shoulder Row with Anchored Resistance  - 1 x daily - 7 x weekly - 2 sets - 15 reps - Supine Shoulder Horizontal Abduction with Resistance  - 1 x daily - 7 x weekly - 2 sets - 15 reps  ASSESSMENT:  CLINICAL IMPRESSION: Pt arrives w/ 2/10 pain, no new issues reported. Continuing to work on [facility] mobility and progression for strength as pt primary complaint is R shoulder weakness. Continues to require cues as above but tolerates well overall aside from some transient increase in R lateral shoulder pain with abduction which is discontinued for today. HEP update as above. No adverse events, pt reports no pain on departure. Recommend continuing along current POC in order to address relevant deficits and improve functional tolerance. Pt departs today's session in no acute distress, all voiced questions/concerns addressed appropriately from PT perspective.     OBJECTIVE IMPAIRMENTS: decreased activity tolerance, decreased endurance, decreased mobility, decreased ROM, decreased strength, hypomobility, impaired UE functional use, postural dysfunction, and pain.   ACTIVITY LIMITATIONS:  carrying, lifting, sitting, dressing, and reach over head  PARTICIPATION LIMITATIONS: meal prep, cleaning, and community activity  PERSONAL FACTORS: Age, Time since onset of injury/illness/exacerbation, and 3+ comorbidities: cardiac hx, hx DVT/PE, HTN  are also affecting patient's functional outcome.   REHAB POTENTIAL: Good  CLINICAL DECISION MAKING: Stable/uncomplicated  EVALUATION COMPLEXITY: Low   GOALS: Goals reviewed with patient? No given time constraints  SHORT TERM GOALS: Target date: 01/04/2023 Pt will demonstrate appropriate understanding and performance of initially prescribed HEP in order to facilitate improved independence with management of symptoms.  Baseline: HEP provided on eval Goal status: met   2. Pt will score greater than or equal to 60 on FOTO in order to demonstrate improved perception of function due to symptoms.  Baseline: 52  Goal status: INITIAL     LONG TERM GOALS: Target date: 02/01/2023 Pt will score 67 on FOTO in order to demonstrate improved perception of function due to symptoms. Baseline: 52 Goal status: INITIAL  2.  Pt will demonstrate at least 130 degrees of active shoulder elevation bilaterally in order to demonstrate improved tolerance to functional movement patterns such as upper body dressing and reaching overhead. Baseline: see ROM chart above Goal status: met  3.  Pt will demonstrate at least 1 pt increase in global shoulder MMT for improved symmetry of UE strength and improved tolerance to functional movements.  Baseline: see MMT chart above Goal status: INITIAL  4. Pt will report ability to sit for up to 1hr with less than 2 pt increase in pain in order to facilitate improved participation in community activities (theater)  Baseline: up to 4/10 with sitting 20-42min  Goal status: INITIAL   5. Pt will demonstrate grossly symmetrical functional external rotation (within 1 vertebral level) in order to facilitate improved tolerance to  self care/ADL tasks.   Baseline: see ROM chart above  Goal status: INITIAL   6. Pt will report at least 50% decrease in overall pain levels in past week in order to facilitate improved tolerance to basic ADLs/mobility.   Baseline: 2-4/10 over past week  Goal status: INITIAL    7. Pt will demonstrate ability to lift up to 30# with less than 2 pt increase in pain in order to facilitate improved tolerance to heavier household tasks.   Baseline: pain reported with 25-40#  Goal status: INITIAL   PLAN:  PT FREQUENCY: 1-2x/week (plan for 2x/week 4 weeks, then 1x/week 4 weeks, 13 visits  total including initial evaluation)  PT DURATION: 8 weeks  PLANNED INTERVENTIONS: Therapeutic exercises, Therapeutic activity, Neuromuscular re-education, Balance training, Gait training, Patient/Family education, Self Care, Joint mobilization, Aquatic Therapy, Dry Needling, Electrical stimulation, Spinal mobilization, Cryotherapy, Moist heat, Taping, Manual therapy, and Re-evaluation  PLAN FOR NEXT SESSION: Review/update HEP PRN. Work on [employer] exercises as appropriate with emphasis on postural extension, periscapular/GH activation, and GH mobility. Symptom modification strategies as indicated/appropriate.     Ashley Murrain PT, DPT 12/26/2022 1:25 PM

## 2022-12-26 ENCOUNTER — Ambulatory Visit: Payer: Medicare PPO | Admitting: Physical Therapy

## 2022-12-26 ENCOUNTER — Encounter: Payer: Self-pay | Admitting: Physical Therapy

## 2022-12-26 ENCOUNTER — Ambulatory Visit (INDEPENDENT_AMBULATORY_CARE_PROVIDER_SITE_OTHER): Payer: Medicare PPO

## 2022-12-26 VITALS — Ht 68.0 in | Wt 220.0 lb

## 2022-12-26 DIAGNOSIS — M6281 Muscle weakness (generalized): Secondary | ICD-10-CM

## 2022-12-26 DIAGNOSIS — R293 Abnormal posture: Secondary | ICD-10-CM | POA: Diagnosis not present

## 2022-12-26 DIAGNOSIS — M25612 Stiffness of left shoulder, not elsewhere classified: Secondary | ICD-10-CM

## 2022-12-26 DIAGNOSIS — G8929 Other chronic pain: Secondary | ICD-10-CM | POA: Diagnosis not present

## 2022-12-26 DIAGNOSIS — M25512 Pain in left shoulder: Secondary | ICD-10-CM | POA: Diagnosis not present

## 2022-12-26 DIAGNOSIS — M25611 Stiffness of right shoulder, not elsewhere classified: Secondary | ICD-10-CM

## 2022-12-26 DIAGNOSIS — Z0000 Encounter for general adult medical examination without abnormal findings: Secondary | ICD-10-CM

## 2022-12-26 DIAGNOSIS — M25511 Pain in right shoulder: Secondary | ICD-10-CM | POA: Diagnosis not present

## 2022-12-26 NOTE — Patient Instructions (Addendum)
[name] , Thank you for taking time to come for your Medicare Wellness Visit. I appreciate your ongoing commitment to your health goals. Please review the following plan we discussed and let me know if I can assist you in the future.   These are the goals we discussed:  Goals       Client understands the importance of follow-up with providers by attending scheduled visits (pt-stated)      "To stay alive"      Exercise 3x per week (30 min per time)      Will increase exercise to walking daily [facility] [employer]; Will continue toward weight loss goals      Not to get bronchitis.      Patient Stated      Try as much as possible to stay ahead of the constipation. Continue to take the medications to help and follow the instructions of Dr. Rhea Belton.  Enjoy life, family and grand-daughter.      Patient Stated      Continue to be physically and socially active.      Patient Stated (pt-stated)      My goal is to get back in the gym.       Stay Healthy (pt-stated)        This is a list of the screening recommended for you and due dates:  Health Maintenance  Topic Date Due   DTaP/Tdap/Td vaccine (1 - Tdap) Never done   Zoster (Shingles) Vaccine (1 of 2) Never done   Complete foot exam   10/06/2022   Eye exam for diabetics  01/27/2023   Flu Shot  02/01/2023   Hemoglobin A1C  02/09/2023   Yearly kidney health urinalysis for diabetes  08/12/2023   Yearly kidney function blood test for diabetes  09/26/2023   Medicare Annual Wellness Visit  12/26/2023   Colon Cancer Screening  02/22/2025   Pneumonia Vaccine  Completed   COVID-19 Vaccine  Completed   Hepatitis C Screening  Completed   HPV Vaccine  Aged Out    Advanced directives: Advance directive discussed with you today. Even though you declined this today, please call our office should you change your mind, and we can give you the proper paperwork for you to fill out.   Conditions/risks identified: None  Next  appointment: Follow up in one year for your annual wellness visit.   Preventive Care 20 Years and Older, Male  Preventive care refers to lifestyle choices and visits with your health care provider that can promote health and wellness. What does preventive care include? A yearly physical exam. This is also called an annual well check. Dental exams once or twice a year. Routine eye exams. Ask your health care provider how often you should have your eyes checked. Personal lifestyle choices, including: Daily care of your teeth and gums. Regular physical activity. Eating a healthy diet. Avoiding tobacco and drug use. Limiting alcohol use. Practicing safe sex. Taking low doses of aspirin every day. Taking vitamin and mineral supplements as recommended by your health care provider. What happens during an annual well check? The services and screenings done by your health care provider during your annual well check will depend on your age, overall health, lifestyle risk factors, and family history of disease. Counseling  Your health care provider may ask you questions about your: Alcohol use. Tobacco use. Drug use. Emotional well-being. Home and relationship well-being. Sexual activity. Eating habits. History of falls. Memory and ability  to understand (cognition). Work and work Astronomer. Screening  You may have the following tests or measurements: Height, weight, and BMI. Blood pressure. Lipid and cholesterol levels. These may be checked every 5 years, or more frequently if you are over 53 years old. Skin check. Lung cancer screening. You may have this screening every year starting at age 76 if you have a 30-pack-year history of smoking and currently smoke or have quit within the past 15 years. Fecal occult blood test (FOBT) of the stool. You may have this test every year starting at age 21. Flexible sigmoidoscopy or colonoscopy. You may have a sigmoidoscopy every 5 years or a  colonoscopy every 10 years starting at age 7. Prostate cancer screening. Recommendations will vary depending on your family history and other risks. Hepatitis C blood test. Hepatitis B blood test. Sexually transmitted disease (STD) testing. Diabetes screening. This is done by checking your blood sugar (glucose) after you have not eaten for a while (fasting). You may have this done every 1-3 years. Abdominal aortic aneurysm (AAA) screening. You may need this if you are a current or former smoker. Osteoporosis. You may be screened starting at age 62 if you are at high risk. Talk with your health care provider about your test results, treatment options, and if necessary, the need for more tests. Vaccines  Your health care provider may recommend certain vaccines, such as: Influenza vaccine. This is recommended every year. Tetanus, diphtheria, and acellular pertussis (Tdap, Td) vaccine. You may need a Td booster every 10 years. Zoster vaccine. You may need this after age 75. Pneumococcal 13-valent conjugate (PCV13) vaccine. One dose is recommended after age 40. Pneumococcal polysaccharide (PPSV23) vaccine. One dose is recommended after age 62. Talk to your health care provider about which screenings and vaccines you need and how often you need them. This information is not intended to replace advice given to you by your health care provider. Make sure you discuss any questions you have with your health care provider. Document Released: 07/16/2015 Document Revised: 03/08/2016 Document Reviewed: 04/20/2015 Elsevier Interactive Patient Education  2017 [employer].  Fall Prevention in the Home Falls can cause injuries. They can happen to people of all ages. There are many things you can do to make your home safe and to help prevent falls. What can I do on the outside of my home? Regularly fix the edges of walkways and driveways and fix any cracks. Remove anything that might make you trip as you  walk through a door, such as a raised step or threshold. Trim any bushes or trees on the path to your home. Use bright outdoor lighting. Clear any walking paths of anything that might make someone trip, such as rocks or tools. Regularly check to see if handrails are loose or broken. Make sure that both sides of any steps have handrails. Any raised decks and porches should have guardrails on the edges. Have any leaves, snow, or ice cleared regularly. Use sand or salt on walking paths during winter. Clean up any spills in your garage right away. This includes oil or grease spills. What can I do in the bathroom? Use night lights. Install grab bars by the toilet and in the tub and shower. Do not use towel bars as grab bars. Use non-skid mats or decals in the tub or shower. If you need to sit down in the shower, use a plastic, non-slip stool. Keep the floor dry. Clean up any water that spills on the floor  as soon as it happens. Remove soap buildup in the tub or shower regularly. Attach bath mats securely with double-sided non-slip rug tape. Do not have throw rugs and other things on the floor that can make you trip. What can I do in the bedroom? Use night lights. Make sure that you have a light by your bed that is easy to reach. Do not use any sheets or blankets that are too big for your bed. They should not hang down onto the floor. Have a firm chair that has side arms. You can use this for support while you get dressed. Do not have throw rugs and other things on the floor that can make you trip. What can I do in the kitchen? Clean up any spills right away. Avoid walking on wet floors. Keep items that you use a lot in easy-to-reach places. If you need to reach something above you, use a strong step stool that has a grab bar. Keep electrical cords out of the way. Do not use floor polish or wax that makes floors slippery. If you must use wax, use non-skid floor wax. Do not have throw rugs  and other things on the floor that can make you trip. What can I do with my stairs? Do not leave any items on the stairs. Make sure that there are handrails on both sides of the stairs and use them. Fix handrails that are broken or loose. Make sure that handrails are as long as the stairways. Check any carpeting to make sure that it is firmly attached to the stairs. Fix any carpet that is loose or worn. Avoid having throw rugs at the top or bottom of the stairs. If you do have throw rugs, attach them to the floor with carpet tape. Make sure that you have a light switch at the top of the stairs and the bottom of the stairs. If you do not have them, ask someone to add them for you. What else can I do to help prevent falls? Wear shoes that: Do not have high heels. Have rubber bottoms. Are comfortable and fit you well. Are closed at the toe. Do not wear sandals. If you use a stepladder: Make sure that it is fully opened. Do not climb a closed stepladder. Make sure that both sides of the stepladder are locked into place. Ask someone to hold it for you, if possible. Clearly mark and make sure that you can see: Any grab bars or handrails. First and last steps. Where the edge of each step is. Use tools that help you move around (mobility aids) if they are needed. These include: Canes. Walkers. Scooters. Crutches. Turn on the lights when you go into a dark area. Replace any light bulbs as soon as they burn out. Set up your furniture so you have a clear path. Avoid moving your furniture around. If any of your floors are uneven, fix them. If there are any pets around you, be aware of where they are. Review your medicines with your doctor. Some medicines can make you feel dizzy. This can increase your chance of falling. Ask your doctor what other things that you can do to help prevent falls. This information is not intended to replace advice given to you by your health care provider. Make sure  you discuss any questions you have with your health care provider. Document Released: 04/15/2009 Document Revised: 11/25/2015 Document Reviewed: 07/24/2014 Elsevier Interactive Patient Education  2017 [employer].

## 2022-12-26 NOTE — Progress Notes (Signed)
Subjective:   Noah Barnes is a 77 y.o. male who presents for Medicare Annual/Subsequent preventive examination.  Visit Complete: Virtual  I connected with  Noah Barnes on 12/26/22 by a audio enabled telemedicine application and verified that I am speaking with the correct person using two identifiers.  Patient Location: Home  Provider Location: Home Office  I discussed the limitations of evaluation and management by telemedicine. The patient expressed understanding and agreed to proceed.  Patient Medicare AWV questionnaire was completed by the patient on ; I have confirmed that all information answered by patient is correct and no changes since this date.  Review of Systems     Cardiac Risk Factors include: advanced age (>33men, >58 women);diabetes mellitus;male gender;hypertension     Objective:    Today's Vitals   12/26/22 1509  Weight: 220 lb (99.8 kg)  Height: 5\' 8"  (1.727 m)   Body mass index is 33.45 kg/m.     12/26/2022    3:17 PM 12/07/2022    9:34 AM 09/26/2022   10:08 AM 03/28/2022   10:04 AM 01/16/2022    2:07 PM 09/26/2021    1:18 PM 03/25/2021   11:45 AM  Advanced Directives  Does Patient Have a Medical Advance Directive? No No No No No No Yes  Type of Tax inspector;Living will  Copy of Healthcare Power of Attorney in Chart?       No - copy requested  Would patient like information on creating a medical advance directive? No - Patient declined No - Patient declined No - Patient declined No - Patient declined No - Patient declined No - Patient declined     Current Medications (verified) Outpatient Encounter Medications as of 12/26/2022  Medication Sig   ACCU-CHEK GUIDE test strip USE AS DIRECTED TO TEST BLOOD SUGAR TWICE DAILY   Accu-Chek Softclix Lancets lancets CHECK BLOOD SUGAR TWICE A DAY.   albuterol (PROVENTIL) (2.5 MG/3ML) 0.083% nebulizer solution Take 3 mLs (2.5 mg total) by nebulization every 6 (six)  hours as needed for wheezing or shortness of breath.   albuterol (VENTOLIN HFA) 108 (90 Base) MCG/ACT inhaler Inhale 2 puffs into the lungs every 6 (six) hours as needed for wheezing or shortness of breath.   allopurinol (ZYLOPRIM) 300 MG tablet Take 1 tablet by mouth once daily   atorvastatin (LIPITOR) 20 MG tablet Take 1 tablet by mouth once daily   Blood Glucose Monitoring Suppl (ACCU-CHEK GUIDE ME) w/Device KIT 1 each by Does not apply route in the morning and at bedtime. Use Accu Chek Guide me device to check blood sugar twice daily.   ELIQUIS 2.5 MG TABS tablet Take 1 tablet by mouth twice daily   FERREX 150 150 MG capsule Take 1 capsule by mouth once daily   fluticasone-salmeterol (ADVAIR DISKUS) 250-50 MCG/ACT AEPB Inhale 1 puff into the lungs in the morning and at bedtime.   furosemide (LASIX) 40 MG tablet Take 40 mg by mouth daily.   glimepiride (AMARYL) 2 MG tablet TAKE 1 TABLET BY MOUTH ONCE DAILY BEFORE SUPPER   hydrALAZINE (APRESOLINE) 50 MG tablet Take 1 tablet by mouth twice daily   LINZESS 290 MCG CAPS capsule TAKE 1 CAPSULE BY MOUTH ONCE DAILY BEFORE BREAKFAST   metoprolol tartrate (LOPRESSOR) 100 MG tablet Take 1 tablet by mouth twice daily   Semaglutide (RYBELSUS) 14 MG TABS TAKE 1 TABLET BY MOUTH IN THE MORNING 30 MINUTES BEFORE BREAKFAST WITH  SIP OF WATER   sildenafil (VIAGRA) 100 MG tablet TAKE 1 TABLET BY MOUTH EVERY DAY AS NEEDED FOR ERECTILE DYSFUNCTION   tamsulosin (FLOMAX) 0.4 MG CAPS capsule TAKE 1 CAPSULE BY MOUTH ONCE DAILY AFTER SUPPER   Tiotropium Bromide Monohydrate (SPIRIVA RESPIMAT) 2.5 MCG/ACT AERS Inhale 2 puffs into the lungs daily.   No facility-administered encounter medications on file as of 12/26/2022.    Allergies (verified) Ace inhibitors and Angiotensin receptor blockers   History: Past Medical History:  Diagnosis Date   CHF (congestive heart failure) (HCC)    Chronic diastolic heart failure (HCC)    Constipation    Diabetes mellitus     Diverticulitis    Diverticulosis    ED (erectile dysfunction)    Gout    Heart murmur    Hyperlipidemia    Hypertension    IBS (irritable bowel syndrome)    Renal calculus    Renal insufficiency    Tubular adenoma of colon    Past Surgical History:  Procedure Laterality Date   COLONOSCOPY  1998   negative; no F/U   EXTRACORPOREAL SHOCK WAVE LITHOTRIPSY Left 03/20/2019   Procedure: EXTRACORPOREAL SHOCK WAVE LITHOTRIPSY (ESWL);  Surgeon: Malen Gauze, MD;  Location: [facility];  Service: Urology;  Laterality: Left;   knee effusion tapped     Family History  Problem Relation Age of Onset   Heart attack Mother 67   Hypertension Mother    Prostate cancer Father 77   Diabetes Father    Irritable bowel syndrome Father    Irritable bowel syndrome Sister        x 2   Colon cancer Brother        dx in his late 20's   Heart disease Brother    Irritable bowel syndrome Brother    Stroke Neg Hx    Esophageal cancer Neg Hx    Rectal cancer Neg Hx    Stomach cancer Neg Hx    Social History   Socioeconomic History   Marital status: Married    Spouse name: Not on file   Number of children: 1   Years of education: Not on file   Highest education level: Not on file  Occupational History   Occupation: Psychologist, counselling: [employer]    Comment: [phone]  Tobacco Use   Smoking status: Former    Packs/day: 0.30    Years: 23.00    Additional pack years: 0.00    Total pack years: 6.90    Types: Cigarettes    Quit date: 07/04/1991    Years since quitting: 31.5   Smokeless tobacco: Never   Tobacco comments:    smoked age [age], up to 1/3 ppd; may be less  Vaping Use   Vaping Use: Never used  Substance and Sexual Activity   Alcohol use: Not Currently    Comment: rarely   Drug use: No   Sexual activity: Yes  Other Topics Concern   Not on file  Social History Narrative   Not on file   Social Determinants of Health   Financial Resource Strain:  Low Risk  (12/26/2022)   Overall Financial Resource Strain (CARDIA)    Difficulty of Paying Living Expenses: Not hard at all  Food Insecurity: No Food Insecurity (12/26/2022)   Hunger Vital Sign    Worried About Running Out of Food in the Last Year: Never true    Ran Out of Food in the Last  Year: Never true  Transportation Needs: No Transportation Needs (12/26/2022)   PRAPARE - Administrator, Civil Service (Medical): No    Lack of Transportation (Non-Medical): No  Physical Activity: Insufficiently Active (12/26/2022)   Exercise Vital Sign    Days of Exercise per Week: 3 days    Minutes of Exercise per Session: 20 min  Stress: No Stress Concern Present (12/26/2022)   [employer] of Occupational Health - Occupational Stress Questionnaire    Feeling of Stress : Not at all  Social Connections: Socially Integrated (12/26/2022)   Social Connection and Isolation Panel [NHANES]    Frequency of Communication with Friends and Family: More than three times a week    Frequency of Social Gatherings with Friends and Family: More than three times a week    Attends Religious Services: More than 4 times per year    Active Member of [employer] or Organizations: Yes    Attends Engineer, structural: More than 4 times per year    Marital Status: Married    Tobacco Counseling Counseling given: Not Answered Tobacco comments: smoked age [age], up to 1/3 ppd; may be less   Clinical Intake:  Pre-visit preparation completed: No  Pain : No/denies pain     BMI - recorded: 33.45 Nutritional Status: BMI > 30  Obese Nutritional Risks: None Diabetes: Yes CBG done?: Yes (CBG 95 Taken by patient) CBG resulted in Enter/ Edit results?: Yes Did pt. bring in CBG monitor from home?: No  How often do you need to have someone help you when you read instructions, pamphlets, or other written materials from your doctor or pharmacy?: 1 - Never  Interpreter Needed?: No  Information entered by  :: Theresa Mulligan LPN   Activities of Daily Living    12/26/2022    3:16 PM 01/16/2022    2:08 PM  In your present state of health, do you have any difficulty performing the following activities:  Hearing? 0 0  Vision? 0 0  Difficulty concentrating or making decisions? 0 0  Walking or climbing stairs? 0 0  Dressing or bathing? 0 0  Doing errands, shopping? 0 0  Preparing Food and eating ? N N  Using the Toilet? N N  In the past six months, have you accidently leaked urine? N N  Do you have problems with loss of bowel control? N N  Managing your Medications? N N  Managing your Finances? N N  Housekeeping or managing your Housekeeping? N N    Patient Care Team: Pincus Sanes, MD as PCP - General (Internal Medicine) Tonny Bollman, MD as PCP - Cardiology (Cardiology) Reather Littler, MD as Consulting Physician (Endocrinology) Norva Pavlov, OD as Consulting Physician (Optometry)  Indicate any recent Medical Services you may have received from other than [facility] providers in the past year (date may be approximate).     Assessment:   This is a routine wellness examination for [employer].  Hearing/Vision screen Hearing Screening - Comments:: Denies hearing difficulties   Vision Screening - Comments:: Wears rx glasses - up to date with routine eye exams with  [facility]  Dietary issues and exercise activities discussed:     Goals Addressed               This Visit's Progress     Stay Healthy (pt-stated)         Depression Screen    12/26/2022    3:15 PM 08/11/2022    8:20 AM  08/11/2022    8:10 AM 02/08/2022    7:52 AM 01/16/2022    2:06 PM 12/30/2020    9:55 AM 12/29/2020    3:10 PM  PHQ 2/9 Scores  PHQ - 2 Score 0 0 0 0 0 0 0  PHQ- 9 Score  0 0 0       Fall Risk    12/26/2022    3:16 PM 08/11/2022    8:19 AM 08/11/2022    8:09 AM 02/08/2022    7:52 AM 01/16/2022    2:08 PM  Fall Risk   Falls in the past year? 0 0 0 0 0  Number falls in past yr: 0 0 0 0 0  Injury  with Fall? 0 0 0 0 0  Risk for fall due to : No Fall Risks No Fall Risks No Fall Risks No Fall Risks No Fall Risks  Follow up Falls prevention discussed Falls evaluation completed Falls evaluation completed Falls evaluation completed Falls evaluation completed    MEDICARE RISK AT HOME:  Medicare Risk at Home - 12/26/22 1520     Any stairs in or around the home? Yes    If so, are there any without handrails? No    Home free of loose throw rugs in walkways, pet beds, electrical cords, etc? Yes    Adequate lighting in your home to reduce risk of falls? Yes    Life alert? No    Use of a cane, walker or w/c? No    Grab bars in the bathroom? No    Shower chair or bench in shower? Yes    Elevated toilet seat or a handicapped toilet? No             TIMED UP AND GO:  Was the test performed?  No    Cognitive Function:    08/08/2017   10:24 AM 12/15/2014    9:19 AM  MMSE - Mini Mental State Exam  Not completed:  Unable to complete  Orientation to time 5   Orientation to Place 5   Registration 3   Attention/ Calculation 5   Recall 3   Language- name 2 objects 2   Language- repeat 1   Language- follow 3 step command 3   Language- read & follow direction 1   Write a sentence 1   Copy design 1   Total score 30         12/26/2022    3:17 PM 01/16/2022    2:09 PM 09/29/2019   12:22 PM  6CIT Screen  What Year? 0 points 0 points 0 points  What month? 0 points 0 points 0 points  What time? 0 points 0 points 0 points  Count back from 20 0 points 0 points 0 points  Months in reverse 0 points 0 points 0 points  Repeat phrase 0 points 0 points 0 points  Total Score 0 points 0 points 0 points    Immunizations Immunization History  Administered Date(s) Administered   COVID-19, mRNA, vaccine(Comirnaty)12 years and older 04/19/2022   Fluad Quad(high Dose 65+) 03/06/2019, 05/05/2020, 03/26/2021, 04/04/2022   Influenza, High Dose Seasonal PF 07/12/2016, 04/10/2017, 03/22/2018    PFIZER Comirnaty(Gray Top)Covid-19 Tri-Sucrose Vaccine 01/14/2021   PFIZER(Purple Top)SARS-COV-2 Vaccination 08/13/2019, 09/10/2019, 04/06/2020   Pfizer Covid-19 Vaccine Bivalent Booster 55yrs & up 06/08/2021   Pneumococcal Conjugate-13 01/06/2019   Pneumococcal Polysaccharide-23 03/01/2020    TDAP status: Due, Education has been provided regarding the importance of this vaccine. Advised may  receive this vaccine at local pharmacy or Health Dept. Aware to provide a copy of the vaccination record if obtained from local pharmacy or Health Dept. Verbalized acceptance and understanding.  Flu Vaccine status: Up to date  Pneumococcal vaccine status: Up to date  Covid-19 vaccine status: Completed vaccines  Qualifies for Shingles Vaccine? Yes   Zostavax completed No   Shingrix Completed?: No.    Education has been provided regarding the importance of this vaccine. Patient has been advised to call insurance company to determine out of pocket expense if they have not yet received this vaccine. Advised may also receive vaccine at local pharmacy or Health Dept. Verbalized acceptance and understanding.  Screening Tests Health Maintenance  Topic Date Due   DTaP/Tdap/Td (1 - Tdap) Never done   Zoster Vaccines- Shingrix (1 of 2) Never done   FOOT EXAM  10/06/2022   OPHTHALMOLOGY EXAM  01/27/2023   INFLUENZA VACCINE  02/01/2023   HEMOGLOBIN A1C  02/09/2023   Diabetic kidney evaluation - Urine ACR  08/12/2023   Diabetic kidney evaluation - eGFR measurement  09/26/2023   Medicare Annual Wellness (AWV)  12/26/2023   Colonoscopy  02/22/2025   Pneumonia Vaccine 21+ Years old  Completed   COVID-19 Vaccine  Completed   Hepatitis C Screening  Completed   HPV VACCINES  Aged Out    Health Maintenance  Health Maintenance Due  Topic Date Due   DTaP/Tdap/Td (1 - Tdap) Never done   Zoster Vaccines- Shingrix (1 of 2) Never done   FOOT EXAM  10/06/2022    Colorectal cancer screening: Type of screening:  Colonoscopy. Completed 02/23/20. Repeat every 5 years  Lung Cancer Screening: (Low Dose CT Chest recommended if Age 62-80 years, 20 pack-year currently smoking OR have quit w/in 15years.) does not qualify.    Additional Screening:  Hepatitis C Screening: does qualify; Completed 01/10/16  Vision Screening: Recommended annual ophthalmology exams for early detection of glaucoma and other disorders of the eye. Is the patient up to date with their annual eye exam?  Yes  Who is the provider or what is the name of the office in which the patient attends annual eye exams? [facility] If pt is not established with a provider, would they like to be referred to a provider to establish care? No .   Dental Screening: Recommended annual dental exams for proper oral hygiene  Diabetic Foot Exam: Diabetic Foot Exam: Overdue, Pt has been advised about the importance in completing this exam. Pt is scheduled for diabetic foot exam on Followed by Dr Lucianne Muss.  Community Resource Referral / Chronic Care Management:  CRR required this visit?  No   CCM required this visit?  No     Plan:     I have personally reviewed and noted the following in the patient's chart:   Medical and social history Use of alcohol, tobacco or illicit drugs  Current medications and supplements including opioid prescriptions. Patient is not currently taking opioid prescriptions. Functional ability and status Nutritional status Physical activity Advanced directives List of other physicians Hospitalizations, surgeries, and ER visits in previous 12 months Vitals Screenings to include cognitive, depression, and falls Referrals and appointments  In addition, I have reviewed and discussed with patient certain preventive protocols, quality metrics, and best practice recommendations. A written personalized care plan for preventive services as well as general preventive health recommendations were provided to patient.      Tillie Rung, LPN   10/09/8117   After  Visit Summary: (MyChart) Due to this being a telephonic visit, the after visit summary with patients personalized plan was offered to patient via MyChart   Nurse Notes: None

## 2022-12-27 ENCOUNTER — Other Ambulatory Visit: Payer: Self-pay | Admitting: Internal Medicine

## 2022-12-27 NOTE — Therapy (Signed)
OUTPATIENT PHYSICAL THERAPY TREATMENT NOTE   Patient Name: Noah Barnes MRN: [id number] DOB:December 30, 1945, 77 y.o., male Today's Date: 12/28/2022  END OF SESSION:  PT End of Session - 12/28/22 0759     Visit Number 6    Number of Visits 13    Date for PT Re-Evaluation 02/01/23    Authorization Type [employer]    Authorization Time Period Approved 12 visits 12/11/22-01/20/23    Authorization - Visit Number 5    Authorization - Number of Visits 12    Progress Note Due on Visit 10    PT Start Time 0800    PT Stop Time 0844    PT Time Calculation (min) 44 min    Activity Tolerance Patient tolerated treatment well    Behavior During Therapy [facility] for tasks assessed/performed                 Past Medical History:  Diagnosis Date   CHF (congestive heart failure) (HCC)    Chronic diastolic heart failure (HCC)    Constipation    Diabetes mellitus    Diverticulitis    Diverticulosis    ED (erectile dysfunction)    Gout    Heart murmur    Hyperlipidemia    Hypertension    IBS (irritable bowel syndrome)    Renal calculus    Renal insufficiency    Tubular adenoma of colon    Past Surgical History:  Procedure Laterality Date   COLONOSCOPY  1998   negative; no F/U   EXTRACORPOREAL SHOCK WAVE LITHOTRIPSY Left 03/20/2019   Procedure: EXTRACORPOREAL SHOCK WAVE LITHOTRIPSY (ESWL);  Surgeon: Malen Gauze, MD;  Location: [facility];  Service: Urology;  Laterality: Left;   knee effusion tapped     Patient Active Problem List   Diagnosis Date Noted   Acute pain of both shoulders 10/31/2022   Right shoulder pain 10/31/2022   Acute cough 04/19/2021   Right-sided chest pain 04/19/2021   Chest congestion 04/15/2021   Acquired trigger finger of right little finger 12/30/2020   Bilateral primary osteoarthritis of knee 12/30/2020   Aortic atherosclerosis (HCC) 12/29/2020   Chronic bronchitis (HCC) 07/28/2020   BPH (benign prostatic hyperplasia) 07/05/2020   Left lower  quadrant abdominal pain 11/28/2019   Lung nodule - stable 04/04/2019   History of DVT of lower extremity, b/l and PE 04/03/2019   History of pulmonary embolus (PE) 04/03/2019   Hypomagnesemia 04/03/2019   Nephrolithiasis 03/06/2019   Seasonal allergic rhinitis 05/22/2018   MR (mitral regurgitation) 12/20/2017   CKD (chronic kidney disease) stage 3, GFR 30-59 ml/min (HCC) 08/14/2017   Abdominal hernia without obstruction and without gangrene 01/10/2016   Chronic diastolic heart failure (HCC) 02/25/2015   Right renal mass 02/25/2015   Other constipation 02/22/2015   Chronic venous insufficiency 01/18/2015   Superficial phlebitis 01/18/2015   IBS (irritable bowel syndrome) 11/12/2013   Family history of prostate cancer 12/26/2011   Diabetes mellitus with renal complications (HCC) 12/18/2008   Gout 12/31/2007   HYPERLIPIDEMIA 08/14/2007   ERECTILE DYSFUNCTION 05/09/2007   Essential hypertension 11/28/2006    PCP: Pincus Sanes, MD  REFERRING PROVIDER: Richardean Sale, DO  REFERRING DIAG: M25.511,G89.29,M25.512 (ICD-10-CM) - Chronic pain of both shoulders  THERAPY DIAG:  Bilateral shoulder pain, unspecified chronicity  Abnormal posture  Muscle weakness (generalized)  Stiffness of right shoulder, not elsewhere classified  Stiffness of left shoulder, not elsewhere classified  Rationale for Evaluation and Treatment: Rehabilitation  ONSET DATE: at least 6-7 months  SUBJECTIVE STATEMENT: 2/10 pain today, states R shoulder remains a little weak. I get therapeutic massage. I have trouble lifting things in my right arm. My [birth year] in [birth year]     PERTINENT HISTORY: CHF, DM, HTN, renal history, hx DVT/PE. Pt states all well controlled  PAIN:  Are you having pain? Yes: NPRS scale: 2/10 Pain location: bilateral shoulders Pain description: sore Aggravating factors: cold air Relieving factors: heat   PRECAUTIONS: cardiac hx, hx DVT/PE  WEIGHT BEARING RESTRICTIONS:  No    OBJECTIVE: (objective measures completed at initial evaluation unless otherwise dated)   DIAGNOSTIC FINDINGS:  Refer to [facility] for impression of BIL shoulder XR (11/01/22), no acute issues noted per impressions  PATIENT SURVEYS:  FOTO 52 current, 67 predicted Status 12-28-22  72%  COGNITION: Overall cognitive status: Within functional limits for tasks assessed     SENSATION: Light touch intact BIL UE   POSTURE: Significant kyphosis and forward head posture, RUE held in more IR than L  UPPER EXTREMITY ROM:  A/PROM Right eval Left eval 12/15/22 12/21/22 12-28-22  Shoulder flexion 100 deg * 108 deg * Lt 138; Rt: 134 in standing  Rt: 150; Lt: 153 in standing  RT 155 LT 153  standing  Shoulder abduction 88 deg * 102 deg *     Shoulder internal rotation       Shoulder external rotation (functional combo) Occiput * C6 *     Elbow flexion       Elbow extension       Wrist flexion       Wrist extension        (Blank rows = not tested) (Key: WFL = within functional limits not formally assessed, * = concordant pain, s = stiffness/stretching sensation, NT = not tested)  Comments:    UPPER EXTREMITY MMT:  MMT Right eval Left eval  Shoulder flexion 4- * 4-  Shoulder extension    Shoulder abduction 4 * 4+  Shoulder internal rotation 4- 4  Shoulder external rotation 3+ * 4  Elbow flexion    Elbow extension    Grip strength    (Blank rows = not tested)  (Key: WFL = within functional limits not formally assessed, * = concordant pain, s = stiffness/stretching sensation, NT = not tested)  Comments:   SHOULDER SPECIAL TESTS: NT  JOINT MOBILITY TESTING:  NT  PALPATION:  Concordant tenderness BIL deltoids, infraspinatus, teres minor/major. Generalized tightness upper traps and rhomboids bilat   TODAY'S TREATMENT:    [facility] Adult PT Treatment:                                                DATE: 12-28-22 FOTO  Therapeutic Exercise: Pulley flexion/scaption each cues for  good posture comfortable ROM  Supine GH flexion AAROM w/ dowel + 2 # cuff weight  2x10 cues for comfortable ROM  slight incline on table Supine GH ER fall outs at 45 deg abd 2x10  with 2 # DB RUE only cues for reduced elbow compensations Sidelying ER 2x12 BIL 2# cues for shoulder positioning and appropriate ROM Sidelying GH abduction 2# 1x20 LUE only Sidelying GH flexion with 1#  DB 1 x 10, then 2 # DB cues for alignment Seated OH press with dowel and 2 # cuff weight 2 x 10 Manual Therapy: GH mobs inf post glide/  and ER Periscapular DTW                                                                                                                             [facility] Adult PT Treatment:                                                DATE: 12/26/22 Therapeutic Exercise: Pulley flexion/scaption each cues for comfortable ROM  Supine GH flexion AAROM w/ dowel + 1# 3 x10 cues for comfortable ROM (shld feels better with stretch) Supine GH ER fall outs at 45 deg abd 2x10 RUE only cues for reduced elbow compensations Sidelying ER 2x12 BIL 1# cues for shoulder positioning and appropriate ROM Sidelying GH abduction 1# 2x10 LUE only (discontinued on R due to discomfort in lateral shoulder) Standing green band rows x15 cues for setup, blue band x10 cues for posture  Physioball rollout 2x33min cues for comfortable ROM HEP update/education   [facility] Adult PT Treatment:                                                DATE: 12/21/22 Therapeutic Exercise: Pulleys flexion/scaption x 2 minutes each  Supine shoulder flexion AAROM with dowel x 10  Sidelying ER 2 x 10 @ 1 lb Sidelying shoulder abduction 2 x 10 @ 1 lb  Resisted shoulder extension yellow band 2 x 15  Resisted rows green band 2 x 15 Physioball rollout x 1 minute    [facility] Adult PT Treatment:                                                DATE: 12/15/22 Therapeutic Exercise: Supine shoulder flexion AAROM with dowel x 10 Sidelying shoulder  abduction 2 x 10  Sidelying shoulder ER 2 x 10  Pec doorway stretch 2 x 30 sec Pulleys flexion x 2 minutes Resisted rows blue band 2 x 10  Supine resisted horizontal shoulder abduction red band 2 x 10  Bilateral shoulder ER yellow band 2 x 10  Shoulder flexion towel slides at wall x 5 each; 5 sec hold  Updated HEP     [facility] Adult PT Treatment:                                                DATE: 12/14/22 Therapeutic Exercise: Scapular retraction 2x10 cues for reduced UT compensations  Standing shoulder flexion walkback  at counter 2x10 cues for comfortable ROM Supine shoulder flexion AAROM w/ dowel 2x8 cues for comfortable ROM and pacing  Supine chest press w/ dowel rod 2x8 cues for setup RTB bicep curl 2x8 BIL UE cues for posture Tricep pushdown RTB 2x8 BIL UE cues for setup and posture  Standing shoulder flexion iso 2x5 BIL cues for form and appropriate exertion Standing swiss ball GH flexion at table 2x10 cues for reduced UT compensations  Seated shoulder shrugs x12 cues for form, emphasis on eccentric control and scapular depression, cues for breath control    PATIENT EDUCATION: Education details: rationale for interventions, HEP Person educated: Patient Education method: Explanation Education comprehension: verbalized understanding  HOME EXERCISE PROGRAM: Access Code: [email or web address] URL: [email or web address] Date: 12/26/2022 Prepared by: Fransisco Hertz  Exercises - Standing 'L' Stretch at Counter  - 1 x daily - 7 x weekly - 3 sets - 10 reps - Doorway Pec Stretch at 60 Elevation  - 1 x daily - 7 x weekly - 3 sets - 30 sec hold - Supine Shoulder Flexion Extension AAROM with Dowel  - 1 x daily - 7 x weekly - 2 sets - 15 reps - Standing Shoulder Row with Anchored Resistance  - 1 x daily - 7 x weekly - 2 sets - 15 reps - Supine Shoulder Horizontal Abduction with Resistance  - 1 x daily - 7 x weekly - 2 sets - 15 reps  added by: Garen Lah 12-28-22 Program  Notes Sidelying on your Right side with Red theraband and your shoulder at 90/90 as shown in clinic  3 x 10  - Sidelying Shoulder ER with Towel and Dumbbell  - 1 x daily - 7 x weekly - 3 sets - 10 reps - sidelying Shoulder  Flexion to 90 degrees  - 1 x daily - 7 x weekly - 3 sets - 10 reps ASSESSMENT:  CLINICAL IMPRESSION:   Pt arrives w/ 2/10 pain, no new issues reported.  Pt FOTO at 72% and achieved STG and LTG.  Pt HEP updated with resistive exercises. Continuing to work on [facility] mobility and progression for strength as pt primary complaint is R shoulder weakness and minimal pain. Continues to require cues for proper execution of exercise with occasional complain of pulling pain with R UE overhead.  Pt at end of session 1/10 pain.  Recommend continuing along current POC in order to address relevant deficits and improve functional tolerance. .     OBJECTIVE IMPAIRMENTS: decreased activity tolerance, decreased endurance, decreased mobility, decreased ROM, decreased strength, hypomobility, impaired UE functional use, postural dysfunction, and pain.   ACTIVITY LIMITATIONS: carrying, lifting, sitting, dressing, and reach over head  PARTICIPATION LIMITATIONS: meal prep, cleaning, and community activity  PERSONAL FACTORS: Age, Time since onset of injury/illness/exacerbation, and 3+ comorbidities: cardiac hx, hx DVT/PE, HTN  are also affecting patient's functional outcome.   REHAB POTENTIAL: Good  CLINICAL DECISION MAKING: Stable/uncomplicated  EVALUATION COMPLEXITY: Low   GOALS: Goals reviewed with patient? No given time constraints  SHORT TERM GOALS: Target date: 01/04/2023 Pt will demonstrate appropriate understanding and performance of initially prescribed HEP in order to facilitate improved independence with management of symptoms.  Baseline: HEP provided on eval Goal status: MET   2. Pt will score greater than or equal to 60 on FOTO in order to demonstrate improved perception of  function due to symptoms.  Baseline: 52  Status 12-28-22  72%  Goal status: MET   LONG TERM GOALS: Target date: 02/01/2023 Pt will  score 67 on FOTO in order to demonstrate improved perception of function due to symptoms. Baseline: 52 Status 12-28-22  72% Goal status: MET  2.  Pt will demonstrate at least 130 degrees of active shoulder elevation bilaterally in order to demonstrate improved tolerance to functional movement patterns such as upper body dressing and reaching overhead. Baseline: see ROM chart above Goal status: met  3.  Pt will demonstrate at least 1 pt increase in global shoulder MMT for improved symmetry of UE strength and improved tolerance to functional movements.  Baseline: see MMT chart above Goal status: ONGOING  4. Pt will report ability to sit for up to 1hr with less than 2 pt increase in pain in order to facilitate improved participation in community activities (theater)  Baseline: up to 4/10 with sitting 20-56min  12-28-22  2/10 today  Goal status: ONGOING   5. Pt will demonstrate grossly symmetrical functional external rotation (within 1 vertebral level) in order to facilitate improved tolerance to self care/ADL tasks.   Baseline: see ROM chart above  Goal status: ONGOING  6. Pt will report at least 50% decrease in overall pain levels in past week in order to facilitate improved tolerance to basic ADLs/mobility.   Baseline: 2-4/10 over past week  Goal status:ONGOING   7. Pt will demonstrate ability to lift up to 30# with less than 2 pt increase in pain in order to facilitate improved tolerance to heavier household tasks.   Baseline: pain reported with 25-40#  Goal status: ONGOING  PLAN:  PT FREQUENCY: 1-2x/week (plan for 2x/week 4 weeks, then 1x/week 4 weeks, 13 visits total including initial evaluation)  PT DURATION: 8 weeks  PLANNED INTERVENTIONS: Therapeutic exercises, Therapeutic activity, Neuromuscular re-education, Balance training, Gait training,  Patient/Family education, Self Care, Joint mobilization, Aquatic Therapy, Dry Needling, Electrical stimulation, Spinal mobilization, Cryotherapy, Moist heat, Taping, Manual therapy, and Re-evaluation  PLAN FOR NEXT SESSION: Review/update HEP PRN. Work on [employer] exercises as appropriate with emphasis on postural extension, periscapular/GH activation, and GH mobility. Symptom modification strategies as indicated/appropriate.     Garen Lah, PT, A[facility] Certified Exercise Expert for the Aging Adult  12/28/22 8:48 AM Phone: [phone] Fax: [phone]

## 2022-12-28 ENCOUNTER — Encounter: Payer: Self-pay | Admitting: Physical Therapy

## 2022-12-28 ENCOUNTER — Ambulatory Visit: Payer: Medicare PPO | Admitting: Physical Therapy

## 2022-12-28 DIAGNOSIS — M25611 Stiffness of right shoulder, not elsewhere classified: Secondary | ICD-10-CM

## 2022-12-28 DIAGNOSIS — M25512 Pain in left shoulder: Secondary | ICD-10-CM | POA: Diagnosis not present

## 2022-12-28 DIAGNOSIS — M25511 Pain in right shoulder: Secondary | ICD-10-CM

## 2022-12-28 DIAGNOSIS — M25612 Stiffness of left shoulder, not elsewhere classified: Secondary | ICD-10-CM | POA: Diagnosis not present

## 2022-12-28 DIAGNOSIS — R293 Abnormal posture: Secondary | ICD-10-CM | POA: Diagnosis not present

## 2022-12-28 DIAGNOSIS — M6281 Muscle weakness (generalized): Secondary | ICD-10-CM

## 2022-12-28 DIAGNOSIS — G8929 Other chronic pain: Secondary | ICD-10-CM | POA: Diagnosis not present

## 2022-12-29 ENCOUNTER — Other Ambulatory Visit: Payer: Self-pay | Admitting: Internal Medicine

## 2023-01-03 DIAGNOSIS — D631 Anemia in chronic kidney disease: Secondary | ICD-10-CM | POA: Diagnosis not present

## 2023-01-03 DIAGNOSIS — E1122 Type 2 diabetes mellitus with diabetic chronic kidney disease: Secondary | ICD-10-CM | POA: Diagnosis not present

## 2023-01-03 DIAGNOSIS — N1831 Chronic kidney disease, stage 3a: Secondary | ICD-10-CM | POA: Diagnosis not present

## 2023-01-03 DIAGNOSIS — N2581 Secondary hyperparathyroidism of renal origin: Secondary | ICD-10-CM | POA: Diagnosis not present

## 2023-01-03 DIAGNOSIS — I129 Hypertensive chronic kidney disease with stage 1 through stage 4 chronic kidney disease, or unspecified chronic kidney disease: Secondary | ICD-10-CM | POA: Diagnosis not present

## 2023-01-05 ENCOUNTER — Ambulatory Visit: Payer: Medicare PPO | Admitting: Physical Therapy

## 2023-01-08 NOTE — Therapy (Signed)
OUTPATIENT PHYSICAL THERAPY TREATMENT NOTE   Patient Name: Noah Barnes MRN: [id number] DOB:1946-04-25, 77 y.o., male Today's Date: 01/09/2023  END OF SESSION:  PT End of Session - 01/09/23 0930     Visit Number 7    Number of Visits 13    Date for PT Re-Evaluation 02/01/23    Authorization Type [employer]    Authorization Time Period Approved 12 visits 12/11/22-01/20/23    Authorization - Visit Number 6    Authorization - Number of Visits 12    Progress Note Due on Visit 10    PT Start Time 0932    PT Stop Time 1010    PT Time Calculation (min) 38 min    Activity Tolerance Patient tolerated treatment well;No increased pain    Behavior During Therapy WFL for tasks assessed/performed                  Past Medical History:  Diagnosis Date   CHF (congestive heart failure) (HCC)    Chronic diastolic heart failure (HCC)    Constipation    Diabetes mellitus    Diverticulitis    Diverticulosis    ED (erectile dysfunction)    Gout    Heart murmur    Hyperlipidemia    Hypertension    IBS (irritable bowel syndrome)    Renal calculus    Renal insufficiency    Tubular adenoma of colon    Past Surgical History:  Procedure Laterality Date   COLONOSCOPY  1998   negative; no F/U   EXTRACORPOREAL SHOCK WAVE LITHOTRIPSY Left 03/20/2019   Procedure: EXTRACORPOREAL SHOCK WAVE LITHOTRIPSY (ESWL);  Surgeon: Malen Gauze, MD;  Location: [facility];  Service: Urology;  Laterality: Left;   knee effusion tapped     Patient Active Problem List   Diagnosis Date Noted   Acute pain of both shoulders 10/31/2022   Right shoulder pain 10/31/2022   Acute cough 04/19/2021   Right-sided chest pain 04/19/2021   Chest congestion 04/15/2021   Acquired trigger finger of right little finger 12/30/2020   Bilateral primary osteoarthritis of knee 12/30/2020   Aortic atherosclerosis (HCC) 12/29/2020   Chronic bronchitis (HCC) 07/28/2020   BPH (benign prostatic hyperplasia)  07/05/2020   Left lower quadrant abdominal pain 11/28/2019   Lung nodule - stable 04/04/2019   History of DVT of lower extremity, b/l and PE 04/03/2019   History of pulmonary embolus (PE) 04/03/2019   Hypomagnesemia 04/03/2019   Nephrolithiasis 03/06/2019   Seasonal allergic rhinitis 05/22/2018   MR (mitral regurgitation) 12/20/2017   CKD (chronic kidney disease) stage 3, GFR 30-59 ml/min (HCC) 08/14/2017   Abdominal hernia without obstruction and without gangrene 01/10/2016   Chronic diastolic heart failure (HCC) 02/25/2015   Right renal mass 02/25/2015   Other constipation 02/22/2015   Chronic venous insufficiency 01/18/2015   Superficial phlebitis 01/18/2015   IBS (irritable bowel syndrome) 11/12/2013   Family history of prostate cancer 12/26/2011   Diabetes mellitus with renal complications (HCC) 12/18/2008   Gout 12/31/2007   HYPERLIPIDEMIA 08/14/2007   ERECTILE DYSFUNCTION 05/09/2007   Essential hypertension 11/28/2006    PCP: Pincus Sanes, MD  REFERRING PROVIDER: Richardean Sale, DO  REFERRING DIAG: M25.511,G89.29,M25.512 (ICD-10-CM) - Chronic pain of both shoulders  THERAPY DIAG:  Bilateral shoulder pain, unspecified chronicity  Abnormal posture  Muscle weakness (generalized)  Stiffness of right shoulder, not elsewhere classified  Stiffness of left shoulder, not elsewhere classified  Rationale for Evaluation and Treatment: Rehabilitation  ONSET DATE: at  least 6-7 months   SUBJECTIVE STATEMENT: Pt arrives w/o pain, states he started using a topical pain relieving spray from [facility] which has been very helpful for him. No issues after last session.     PERTINENT HISTORY: CHF, DM, HTN, renal history, hx DVT/PE. Pt states all well controlled  PAIN:  Are you having pain? Yes: NPRS scale: 2/10 Pain location: bilateral shoulders Pain description: sore Aggravating factors: cold air Relieving factors: heat   PRECAUTIONS: cardiac hx, hx DVT/PE  WEIGHT  BEARING RESTRICTIONS: No    OBJECTIVE: (objective measures completed at initial evaluation unless otherwise dated)   DIAGNOSTIC FINDINGS:  Refer to [facility] for impression of BIL shoulder XR (11/01/22), no acute issues noted per impressions  PATIENT SURVEYS:  FOTO 52 current, 67 predicted Status 12-28-22  72%  COGNITION: Overall cognitive status: Within functional limits for tasks assessed     SENSATION: Light touch intact BIL UE   POSTURE: Significant kyphosis and forward head posture, RUE held in more IR than L  UPPER EXTREMITY ROM:  A/PROM Right eval Left eval 12/15/22 12/21/22 12-28-22 01/09/23 R/L  Shoulder flexion 100 deg * 108 deg * Lt 138; Rt: 134 in standing  Rt: 150; Lt: 153 in standing  RT 155 LT 153  standing 159 deg / 156 deg   Shoulder abduction 88 deg * 102 deg *      Shoulder internal rotation        Shoulder external rotation (functional combo) Occiput * C6 *      Elbow flexion        Elbow extension        Wrist flexion        Wrist extension         (Blank rows = not tested) (Key: WFL = within functional limits not formally assessed, * = concordant pain, s = stiffness/stretching sensation, NT = not tested)  Comments:    UPPER EXTREMITY MMT:  MMT Right eval Left eval  Shoulder flexion 4- * 4-  Shoulder extension    Shoulder abduction 4 * 4+  Shoulder internal rotation 4- 4  Shoulder external rotation 3+ * 4  Elbow flexion    Elbow extension    Grip strength    (Blank rows = not tested)  (Key: WFL = within functional limits not formally assessed, * = concordant pain, s = stiffness/stretching sensation, NT = not tested)  Comments:   SHOULDER SPECIAL TESTS: NT  JOINT MOBILITY TESTING:  NT  PALPATION:  Concordant tenderness BIL deltoids, infraspinatus, teres minor/major. Generalized tightness upper traps and rhomboids bilat   TODAY'S TREATMENT:    [facility] Adult PT Treatment:                                                DATE: 01/09/23 Therapeutic  Exercise: Pulleys flex/scaption each cues for pacing Supine chest press 3# x12, x20  cues for form and pacing Supine GH flex AAROM w/ dowel + 3# x12, x20 Standing green band row 2x15 cues for posture and setup 4# bicep curl x10 BIL Shoulder flexion walkbacks at counter 2x10 cues for appropriate ROM and pacing Supine double ER red band 2x10 cues for comfortable ROM   Therapeutic Activity: Floor to shoulder lift 10# 6 reps 10# floor to counter lift 6 reps, 15# 6 reps   [facility] Adult PT Treatment:  DATE: 12-28-22 FOTO  Therapeutic Exercise: Pulley flexion/scaption each cues for good posture comfortable ROM  Supine GH flexion AAROM w/ dowel + 2 # cuff weight  2x10 cues for comfortable ROM  slight incline on table Supine GH ER fall outs at 45 deg abd 2x10  with 2 # DB RUE only cues for reduced elbow compensations Sidelying ER 2x12 BIL 2# cues for shoulder positioning and appropriate ROM Sidelying GH abduction 2# 1x20 LUE only Sidelying GH flexion with 1#  DB 1 x 10, then 2 # DB cues for alignment Seated OH press with dowel and 2 # cuff weight 2 x 10 Manual Therapy: GH mobs inf post glide/ and ER Periscapular DTW                                                                                                                             [facility] Adult PT Treatment:                                                DATE: 12/26/22 Therapeutic Exercise: Pulley flexion/scaption each cues for comfortable ROM  Supine GH flexion AAROM w/ dowel + 1# 3 x10 cues for comfortable ROM (shld feels better with stretch) Supine GH ER fall outs at 45 deg abd 2x10 RUE only cues for reduced elbow compensations Sidelying ER 2x12 BIL 1# cues for shoulder positioning and appropriate ROM Sidelying GH abduction 1# 2x10 LUE only (discontinued on R due to discomfort in lateral shoulder) Standing green band rows x15 cues for setup, blue band x10 cues for posture   Physioball rollout 2x85min cues for comfortable ROM HEP update/education   [facility] Adult PT Treatment:                                                DATE: 12/21/22 Therapeutic Exercise: Pulleys flexion/scaption x 2 minutes each  Supine shoulder flexion AAROM with dowel x 10  Sidelying ER 2 x 10 @ 1 lb Sidelying shoulder abduction 2 x 10 @ 1 lb  Resisted shoulder extension yellow band 2 x 15  Resisted rows green band 2 x 15 Physioball rollout x 1 minute    [facility] Adult PT Treatment:                                                DATE: 12/15/22 Therapeutic Exercise: Supine shoulder flexion AAROM with dowel x 10 Sidelying shoulder abduction 2 x 10  Sidelying shoulder ER 2 x 10  Pec doorway stretch 2 x 30 sec Pulleys flexion x  2 minutes Resisted rows blue band 2 x 10  Supine resisted horizontal shoulder abduction red band 2 x 10  Bilateral shoulder ER yellow band 2 x 10  Shoulder flexion towel slides at wall x 5 each; 5 sec hold  Updated HEP     [facility] Adult PT Treatment:                                                DATE: 12/14/22 Therapeutic Exercise: Scapular retraction 2x10 cues for reduced UT compensations  Standing shoulder flexion walkback at counter 2x10 cues for comfortable ROM Supine shoulder flexion AAROM w/ dowel 2x8 cues for comfortable ROM and pacing  Supine chest press w/ dowel rod 2x8 cues for setup RTB bicep curl 2x8 BIL UE cues for posture Tricep pushdown RTB 2x8 BIL UE cues for setup and posture  Standing shoulder flexion iso 2x5 BIL cues for form and appropriate exertion Standing swiss ball GH flexion at table 2x10 cues for reduced UT compensations  Seated shoulder shrugs x12 cues for form, emphasis on eccentric control and scapular depression, cues for breath control    PATIENT EDUCATION: Education details: rationale for interventions, HEP Person educated: Patient Education method: Explanation Education comprehension: verbalized understanding  HOME EXERCISE  PROGRAM: Access Code: [email or web address] URL: [email or web address] Date: 12/26/2022 Prepared by: Fransisco Hertz  Exercises - Standing 'L' Stretch at Counter  - 1 x daily - 7 x weekly - 3 sets - 10 reps - Doorway Pec Stretch at 60 Elevation  - 1 x daily - 7 x weekly - 3 sets - 30 sec hold - Supine Shoulder Flexion Extension AAROM with Dowel  - 1 x daily - 7 x weekly - 2 sets - 15 reps - Standing Shoulder Row with Anchored Resistance  - 1 x daily - 7 x weekly - 2 sets - 15 reps - Supine Shoulder Horizontal Abduction with Resistance  - 1 x daily - 7 x weekly - 2 sets - 15 reps  added by: Garen Lah 12-28-22 Program Notes Sidelying on your Right side with Red theraband and your shoulder at 90/90 as shown in clinic  3 x 10  - Sidelying Shoulder ER with Towel and Dumbbell  - 1 x daily - 7 x weekly - 3 sets - 10 reps - sidelying Shoulder  Flexion to 90 degrees  - 1 x daily - 7 x weekly - 3 sets - 10 reps ASSESSMENT:  CLINICAL IMPRESSION: 01/09/2023 Pt arrives w/o pain, continues to endorse steady improvement and great relief from a new topical pain relieving spray.  Pt continues to tolerate progressions well for increased overhead strengthening in gravity reduced positions and increased emphasis on postural endurance. No adverse events or increases in resting pain, cues as above. Most difficulty with bicep curls. Recommend continuing along current POC in order to address relevant deficits and improve functional tolerance. Pt departs today's session in no acute distress, all voiced questions/concerns addressed appropriately from PT perspective.     OBJECTIVE IMPAIRMENTS: decreased activity tolerance, decreased endurance, decreased mobility, decreased ROM, decreased strength, hypomobility, impaired UE functional use, postural dysfunction, and pain.   ACTIVITY LIMITATIONS: carrying, lifting, sitting, dressing, and reach over head  PARTICIPATION LIMITATIONS: meal prep, cleaning, and  community activity  PERSONAL FACTORS: Age, Time since onset of injury/illness/exacerbation, and 3+ comorbidities: cardiac hx, hx DVT/PE, HTN  are also affecting  patient's functional outcome.   REHAB POTENTIAL: Good  CLINICAL DECISION MAKING: Stable/uncomplicated  EVALUATION COMPLEXITY: Low   GOALS: Goals reviewed with patient? No given time constraints  SHORT TERM GOALS: Target date: 01/04/2023 Pt will demonstrate appropriate understanding and performance of initially prescribed HEP in order to facilitate improved independence with management of symptoms.  Baseline: HEP provided on eval Goal status: MET   2. Pt will score greater than or equal to 60 on FOTO in order to demonstrate improved perception of function due to symptoms.  Baseline: 52  Status 12-28-22  72%  Goal status: MET   LONG TERM GOALS: Target date: 02/01/2023 Pt will score 67 on FOTO in order to demonstrate improved perception of function due to symptoms. Baseline: 52 Status 12-28-22  72% Goal status: MET  2.  Pt will demonstrate at least 130 degrees of active shoulder elevation bilaterally in order to demonstrate improved tolerance to functional movement patterns such as upper body dressing and reaching overhead. Baseline: see ROM chart above Goal status: met  3.  Pt will demonstrate at least 1 pt increase in global shoulder MMT for improved symmetry of UE strength and improved tolerance to functional movements.  Baseline: see MMT chart above Goal status: ONGOING  4. Pt will report ability to sit for up to 1hr with less than 2 pt increase in pain in order to facilitate improved participation in community activities (theater)  Baseline: up to 4/10 with sitting 20-67min  12-28-22  2/10 today  01/09/23: 30-40 min up to 3/10  Goal status: ONGOING   5. Pt will demonstrate grossly symmetrical functional external rotation (within 1 vertebral level) in order to facilitate improved tolerance to self care/ADL tasks.    Baseline: see ROM chart above  Goal status: ONGOING  6. Pt will report at least 50% decrease in overall pain levels in past week in order to facilitate improved tolerance to basic ADLs/mobility.   Baseline: 2-4/10 over past week  01/09/23: 0-3/10 over past week  Goal status:ONGOING   7. Pt will demonstrate ability to lift up to 30# with less than 2 pt increase in pain in order to facilitate improved tolerance to heavier household tasks.   Baseline: pain reported with 25-40#  01/09/23: 10-15# lifting with mild transient discomfort   Goal status: ONGOING  PLAN:  PT FREQUENCY: 1-2x/week (plan for 2x/week 4 weeks, then 1x/week 4 weeks, 13 visits total including initial evaluation)  PT DURATION: 8 weeks  PLANNED INTERVENTIONS: Therapeutic exercises, Therapeutic activity, Neuromuscular re-education, Balance training, Gait training, Patient/Family education, Self Care, Joint mobilization, Aquatic Therapy, Dry Needling, Electrical stimulation, Spinal mobilization, Cryotherapy, Moist heat, Taping, Manual therapy, and Re-evaluation  PLAN FOR NEXT SESSION: Review/update HEP PRN. Work on [employer] exercises as appropriate with emphasis on postural extension, periscapular/GH activation, and GH mobility. Symptom modification strategies as indicated/appropriate.  Consider scheduling more visits vs discharge pending pt visit with MD and progress/preference  Ashley Murrain PT, DPT 01/09/2023 11:46 AM

## 2023-01-09 ENCOUNTER — Ambulatory Visit: Payer: Medicare PPO | Attending: Sports Medicine | Admitting: Physical Therapy

## 2023-01-09 ENCOUNTER — Encounter: Payer: Self-pay | Admitting: Physical Therapy

## 2023-01-09 DIAGNOSIS — M25612 Stiffness of left shoulder, not elsewhere classified: Secondary | ICD-10-CM

## 2023-01-09 DIAGNOSIS — M25611 Stiffness of right shoulder, not elsewhere classified: Secondary | ICD-10-CM | POA: Diagnosis not present

## 2023-01-09 DIAGNOSIS — M25511 Pain in right shoulder: Secondary | ICD-10-CM | POA: Diagnosis not present

## 2023-01-09 DIAGNOSIS — R293 Abnormal posture: Secondary | ICD-10-CM | POA: Diagnosis not present

## 2023-01-09 DIAGNOSIS — M6281 Muscle weakness (generalized): Secondary | ICD-10-CM

## 2023-01-09 DIAGNOSIS — M25512 Pain in left shoulder: Secondary | ICD-10-CM | POA: Diagnosis not present

## 2023-01-09 NOTE — Progress Notes (Signed)
Aleen Sells D.[name] [address] [place] [address] Phone: [phone]   Assessment and Plan:     There are no diagnoses linked to this encounter.  ***   Pertinent previous records reviewed include ***   Follow Up: ***     Subjective:   I, Noah Barnes, am serving as a Neurosurgeon for Doctor Richardean Sale   Chief Complaint: bilat shoulder pain    HPI:    11/01/22 Patient is a 77 year old male complaining of bilateral shoulder pain. Patient states that he thinks he has arthritis bilat, states he fell on his right shoulder a month ago, pain radiates down his arm, left isnt as bad as the right , no meds for the pain, he uses a cream that gets hot and that helps , decreased ROM he feels a twinge , if his arms are at rest for a long period of time he has pain    12/06/2022 Patient states he is able to tolerate the pain before he gets the needle, he uses tylenol if it gets bad along with the Voltaren. 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   Current Outpatient Medications:    ACCU-CHEK GUIDE test strip, USE AS DIRECTED TO TEST BLOOD SUGAR TWICE DAILY, Disp: 100 strip, Rfl: 3   Accu-Chek Softclix Lancets lancets, CHECK BLOOD SUGAR TWICE A DAY., Disp: 100 each, Rfl: 2   albuterol (PROVENTIL) (2.5 MG/3ML) 0.083% nebulizer solution, Take 3 mLs (2.5 mg total) by nebulization every 6 (six) hours as needed for wheezing or shortness of breath., Disp: 75 mL, Rfl: 5   albuterol (VENTOLIN HFA) 108 (90 Base) MCG/ACT inhaler, Inhale 2 puffs into the lungs every 6 (six) hours as needed for wheezing or shortness of breath., Disp: 8 g, Rfl: 0   allopurinol (ZYLOPRIM) 300 MG tablet, Take 1 tablet by mouth once daily, Disp: 90 tablet, Rfl: 2   atorvastatin (LIPITOR) 20 MG  tablet, Take 1 tablet by mouth once daily, Disp: 90 tablet, Rfl: 2   Blood Glucose Monitoring Suppl (ACCU-CHEK GUIDE ME) w/Device KIT, 1 each by Does not apply route in the morning and at bedtime. Use Accu Chek Guide me device to check blood sugar twice daily., Disp: 1 kit, Rfl: 0   ELIQUIS 2.5 MG TABS tablet, Take 1 tablet by mouth twice daily, Disp: 180 tablet, Rfl: 0   FERREX 150 150 MG capsule, Take 1 capsule by mouth once daily, Disp: 90 capsule, Rfl: 0   fluticasone-salmeterol (ADVAIR DISKUS) 250-50 MCG/ACT AEPB, Inhale 1 puff into the lungs in the morning and at bedtime., Disp: 60 each, Rfl: 5   furosemide (LASIX) 40 MG tablet, Take 40 mg by mouth daily., Disp: , Rfl:    glimepiride (AMARYL) 2 MG tablet, TAKE 1 TABLET BY MOUTH ONCE DAILY BEFORE SUPPER, Disp: 90 tablet, Rfl: 1   hydrALAZINE (APRESOLINE) 50 MG tablet, Take 1 tablet by mouth twice daily, Disp: 180 tablet, Rfl: 0   LINZESS 290 MCG CAPS capsule, TAKE 1 CAPSULE BY MOUTH ONCE DAILY BEFORE BREAKFAST, Disp: 90 capsule, Rfl: 0   metoprolol tartrate (LOPRESSOR) 100 MG tablet, Take 1 tablet by mouth twice daily, Disp: 180 tablet, Rfl: 0   Semaglutide (RYBELSUS) 14 MG TABS, TAKE 1 TABLET BY MOUTH IN THE MORNING 30  MINUTES BEFORE BREAKFAST WITH SIP OF WATER, Disp: 30 tablet, Rfl: 3   sildenafil (VIAGRA) 100 MG tablet, TAKE 1 TABLET BY MOUTH EVERY DAY AS NEEDED FOR ERECTILE DYSFUNCTION, Disp: 6 tablet, Rfl: 1   tamsulosin (FLOMAX) 0.4 MG CAPS capsule, TAKE 1 CAPSULE BY MOUTH ONCE DAILY AFTER SUPPER, Disp: 90 capsule, Rfl: 0   Tiotropium Bromide Monohydrate (SPIRIVA RESPIMAT) 2.5 MCG/ACT AERS, Inhale 2 puffs into the lungs daily., Disp: 4 g, Rfl: 5   Objective:     There were no vitals filed for this visit.    There is no height or weight on file to calculate BMI.    Physical Exam:    ***   Electronically signed by:  Aleen Sells D.[name] 12:05 PM 01/09/23

## 2023-01-10 ENCOUNTER — Ambulatory Visit: Payer: Medicare PPO | Admitting: Sports Medicine

## 2023-01-10 VITALS — BP 102/68 | HR 78 | Ht 68.0 in | Wt 220.0 lb

## 2023-01-10 DIAGNOSIS — M25512 Pain in left shoulder: Secondary | ICD-10-CM | POA: Diagnosis not present

## 2023-01-10 DIAGNOSIS — M25511 Pain in right shoulder: Secondary | ICD-10-CM

## 2023-01-10 DIAGNOSIS — G8929 Other chronic pain: Secondary | ICD-10-CM | POA: Diagnosis not present

## 2023-01-10 NOTE — Patient Instructions (Signed)
Continue PT and HEP  Continue tylenol and topical gels as needed for pain relief 4-6 week follow up

## 2023-01-11 ENCOUNTER — Other Ambulatory Visit (INDEPENDENT_AMBULATORY_CARE_PROVIDER_SITE_OTHER): Payer: Medicare PPO

## 2023-01-11 ENCOUNTER — Other Ambulatory Visit: Payer: Self-pay | Admitting: Internal Medicine

## 2023-01-11 DIAGNOSIS — E119 Type 2 diabetes mellitus without complications: Secondary | ICD-10-CM

## 2023-01-11 LAB — BASIC METABOLIC PANEL
BUN: 34 mg/dL — ABNORMAL HIGH (ref 6–23)
CO2: 19 mEq/L (ref 19–32)
Calcium: 9.2 mg/dL (ref 8.4–10.5)
Chloride: 114 mEq/L — ABNORMAL HIGH (ref 96–112)
Creatinine, Ser: 1.6 mg/dL — ABNORMAL HIGH (ref 0.40–1.50)
GFR: 41.74 mL/min — ABNORMAL LOW (ref >60.00)
Glucose, Bld: 107 mg/dL — ABNORMAL HIGH (ref 70–99)
Potassium: 4 mEq/L (ref 3.5–5.1)
Sodium: 140 mEq/L (ref 135–145)

## 2023-01-11 LAB — MICROALBUMIN / CREATININE URINE RATIO
Creatinine,U: 206.8 mg/dL
Microalb Creat Ratio: 3.4 mg/g (ref 0.0–30.0)
Microalb, Ur: 7 mg/dL — ABNORMAL HIGH (ref 0.0–1.9)

## 2023-01-11 LAB — HEMOGLOBIN A1C: Hgb A1c MFr Bld: 5.7 % (ref 4.6–6.5)

## 2023-01-12 ENCOUNTER — Encounter: Payer: Self-pay | Admitting: Physical Therapy

## 2023-01-12 ENCOUNTER — Ambulatory Visit: Payer: Medicare PPO | Admitting: Physical Therapy

## 2023-01-12 DIAGNOSIS — M25511 Pain in right shoulder: Secondary | ICD-10-CM | POA: Diagnosis not present

## 2023-01-12 DIAGNOSIS — R293 Abnormal posture: Secondary | ICD-10-CM | POA: Diagnosis not present

## 2023-01-12 DIAGNOSIS — M6281 Muscle weakness (generalized): Secondary | ICD-10-CM | POA: Diagnosis not present

## 2023-01-12 DIAGNOSIS — M25611 Stiffness of right shoulder, not elsewhere classified: Secondary | ICD-10-CM | POA: Diagnosis not present

## 2023-01-12 DIAGNOSIS — M25512 Pain in left shoulder: Secondary | ICD-10-CM | POA: Diagnosis not present

## 2023-01-12 DIAGNOSIS — M25612 Stiffness of left shoulder, not elsewhere classified: Secondary | ICD-10-CM | POA: Diagnosis not present

## 2023-01-12 NOTE — Therapy (Addendum)
OUTPATIENT PHYSICAL THERAPY TREATMENT NOTE   Patient Name: Noah Barnes MRN: [id number] DOB:July 27, 1945, 77 y.o., male Today's Date: 01/12/2023  END OF SESSION:  PT End of Session - 01/12/23 0844     Visit Number 8    Number of Visits 13    Date for PT Re-Evaluation 02/01/23    Authorization Type [employer]    Authorization Time Period Approved 12 visits 12/11/22-01/20/23: requested date extension 01/12/23- JD    Authorization - Visit Number 7    Authorization - Number of Visits 12    Progress Note Due on Visit 10    PT Start Time 0845    PT Stop Time 0923    PT Time Calculation (min) 38 min                  Past Medical History:  Diagnosis Date   CHF (congestive heart failure) (HCC)    Chronic diastolic heart failure (HCC)    Constipation    Diabetes mellitus    Diverticulitis    Diverticulosis    ED (erectile dysfunction)    Gout    Heart murmur    Hyperlipidemia    Hypertension    IBS (irritable bowel syndrome)    Renal calculus    Renal insufficiency    Tubular adenoma of colon    Past Surgical History:  Procedure Laterality Date   COLONOSCOPY  1998   negative; no F/U   EXTRACORPOREAL SHOCK WAVE LITHOTRIPSY Left 03/20/2019   Procedure: EXTRACORPOREAL SHOCK WAVE LITHOTRIPSY (ESWL);  Surgeon: Malen Gauze, MD;  Location: [facility];  Service: Urology;  Laterality: Left;   knee effusion tapped     Patient Active Problem List   Diagnosis Date Noted   Acute pain of both shoulders 10/31/2022   Right shoulder pain 10/31/2022   Acute cough 04/19/2021   Right-sided chest pain 04/19/2021   Chest congestion 04/15/2021   Acquired trigger finger of right little finger 12/30/2020   Bilateral primary osteoarthritis of knee 12/30/2020   Aortic atherosclerosis (HCC) 12/29/2020   Chronic bronchitis (HCC) 07/28/2020   BPH (benign prostatic hyperplasia) 07/05/2020   Left lower quadrant abdominal pain 11/28/2019   Lung nodule - stable 04/04/2019    History of DVT of lower extremity, b/l and PE 04/03/2019   History of pulmonary embolus (PE) 04/03/2019   Hypomagnesemia 04/03/2019   Nephrolithiasis 03/06/2019   Seasonal allergic rhinitis 05/22/2018   MR (mitral regurgitation) 12/20/2017   CKD (chronic kidney disease) stage 3, GFR 30-59 ml/min (HCC) 08/14/2017   Abdominal hernia without obstruction and without gangrene 01/10/2016   Chronic diastolic heart failure (HCC) 02/25/2015   Right renal mass 02/25/2015   Other constipation 02/22/2015   Chronic venous insufficiency 01/18/2015   Superficial phlebitis 01/18/2015   IBS (irritable bowel syndrome) 11/12/2013   Family history of prostate cancer 12/26/2011   Diabetes mellitus with renal complications (HCC) 12/18/2008   Gout 12/31/2007   HYPERLIPIDEMIA 08/14/2007   ERECTILE DYSFUNCTION 05/09/2007   Essential hypertension 11/28/2006    PCP: Pincus Sanes, MD  REFERRING PROVIDER: Richardean Sale, DO  REFERRING DIAG: M25.511,G89.29,M25.512 (ICD-10-CM) - Chronic pain of both shoulders  THERAPY DIAG:  Bilateral shoulder pain, unspecified chronicity  Abnormal posture  Rationale for Evaluation and Treatment: Rehabilitation  ONSET DATE: at least 6-7 months   SUBJECTIVE STATEMENT: Right shoulder can still be painful if lifting. 4/10. No pain at rest. I saw the doctor who said I can decide if I want more PT. I think  I will do a couple more visits.       PERTINENT HISTORY: CHF, DM, HTN, renal history, hx DVT/PE. Pt states all well controlled  PAIN:  Are you having pain? Yes: NPRS scale: 0/10 Pain location: bilateral shoulders Pain description: sore Aggravating factors: cold air Relieving factors: heat   PRECAUTIONS: cardiac hx, hx DVT/PE  WEIGHT BEARING RESTRICTIONS: No    OBJECTIVE: (objective measures completed at initial evaluation unless otherwise dated)   DIAGNOSTIC FINDINGS:  Refer to [facility] for impression of BIL shoulder XR (11/01/22), no acute issues noted  per impressions  PATIENT SURVEYS:  FOTO 52 current, 67 predicted Status 12-28-22  72%  COGNITION: Overall cognitive status: Within functional limits for tasks assessed     SENSATION: Light touch intact BIL UE   POSTURE: Significant kyphosis and forward head posture, RUE held in more IR than L  UPPER EXTREMITY ROM:  A/PROM Right eval Left eval 12/15/22 12/21/22 12-28-22 01/09/23 R/L  Shoulder flexion 100 deg * 108 deg * Lt 138; Rt: 134 in standing  Rt: 150; Lt: 153 in standing  RT 155 LT 153  standing 159 deg / 156 deg   Shoulder abduction 88 deg * 102 deg *      Shoulder internal rotation        Shoulder external rotation (functional combo) Occiput * C6 *      Elbow flexion        Elbow extension        Wrist flexion        Wrist extension         (Blank rows = not tested) (Key: WFL = within functional limits not formally assessed, * = concordant pain, s = stiffness/stretching sensation, NT = not tested)  Comments:    UPPER EXTREMITY MMT:  MMT Right eval Left eval  Shoulder flexion 4- * 4-  Shoulder extension    Shoulder abduction 4 * 4+  Shoulder internal rotation 4- 4  Shoulder external rotation 3+ * 4  Elbow flexion    Elbow extension    Grip strength    (Blank rows = not tested)  (Key: WFL = within functional limits not formally assessed, * = concordant pain, s = stiffness/stretching sensation, NT = not tested)  Comments:   SHOULDER SPECIAL TESTS: NT  JOINT MOBILITY TESTING:  NT  PALPATION:  Concordant tenderness BIL deltoids, infraspinatus, teres minor/major. Generalized tightness upper traps and rhomboids bilat   TODAY'S TREATMENT:    [facility] Adult PT Treatment:                                                DATE: 01/12/23 Therapeutic Exercise: Pulleys  5# bicep curls 10 x  2  Row green 10 x 2  Ext green x 20 Pec stretch in doorway 5 sec x 10 Standing W back x 15 Standing red band ER bilat 10 x 2  Supine chest Press 4# x 15 Supine pullover with dowel  4# x 10 Sidelying shoulder flexion + horiz abdct+ eccentric abduction 2# x 10, bilat S/L  shoulder ER 2# 10 x 1 , AROM x 10, both sides Seated dowel pullover long lever x 12 Seated dowel OH press 4# on dowel x 6    [facility] Adult PT Treatment:  DATE: 01/09/23 Therapeutic Exercise: Pulleys flex/scaption each cues for pacing Supine chest press 3# x12, x20  cues for form and pacing Supine GH flex AAROM w/ dowel + 3# x12, x20 Standing green band row 2x15 cues for posture and setup 4# bicep curl x10 BIL Shoulder flexion walkbacks at counter 2x10 cues for appropriate ROM and pacing Supine double ER red band 2x10 cues for comfortable ROM   Therapeutic Activity: Floor to shoulder lift 10# 6 reps 10# floor to counter lift 6 reps, 15# 6 reps   [facility] Adult PT Treatment:                                                DATE: 12-28-22 FOTO  Therapeutic Exercise: Pulley flexion/scaption each cues for good posture comfortable ROM  Supine GH flexion AAROM w/ dowel + 2 # cuff weight  2x10 cues for comfortable ROM  slight incline on table Supine GH ER fall outs at 45 deg abd 2x10  with 2 # DB RUE only cues for reduced elbow compensations Sidelying ER 2x12 BIL 2# cues for shoulder positioning and appropriate ROM Sidelying GH abduction 2# 1x20 LUE only Sidelying GH flexion with 1#  DB 1 x 10, then 2 # DB cues for alignment Seated OH press with dowel and 2 # cuff weight 2 x 10 Manual Therapy: GH mobs inf post glide/ and ER Periscapular DTW                                                                                                                             [facility] Adult PT Treatment:                                                DATE: 12/26/22 Therapeutic Exercise: Pulley flexion/scaption each cues for comfortable ROM  Supine GH flexion AAROM w/ dowel + 1# 3 x10 cues for comfortable ROM (shld feels better with stretch) Supine GH ER fall outs at  45 deg abd 2x10 RUE only cues for reduced elbow compensations Sidelying ER 2x12 BIL 1# cues for shoulder positioning and appropriate ROM Sidelying GH abduction 1# 2x10 LUE only (discontinued on R due to discomfort in lateral shoulder) Standing green band rows x15 cues for setup, blue band x10 cues for posture  Physioball rollout 2x62min cues for comfortable ROM HEP update/education   [facility] Adult PT Treatment:                                                DATE: 12/21/22 Therapeutic Exercise: Pulleys  flexion/scaption x 2 minutes each  Supine shoulder flexion AAROM with dowel x 10  Sidelying ER 2 x 10 @ 1 lb Sidelying shoulder abduction 2 x 10 @ 1 lb  Resisted shoulder extension yellow band 2 x 15  Resisted rows green band 2 x 15 Physioball rollout x 1 minute    [facility] Adult PT Treatment:                                                DATE: 12/15/22 Therapeutic Exercise: Supine shoulder flexion AAROM with dowel x 10 Sidelying shoulder abduction 2 x 10  Sidelying shoulder ER 2 x 10  Pec doorway stretch 2 x 30 sec Pulleys flexion x 2 minutes Resisted rows blue band 2 x 10  Supine resisted horizontal shoulder abduction red band 2 x 10  Bilateral shoulder ER yellow band 2 x 10  Shoulder flexion towel slides at wall x 5 each; 5 sec hold  Updated HEP     [facility] Adult PT Treatment:                                                DATE: 12/14/22 Therapeutic Exercise: Scapular retraction 2x10 cues for reduced UT compensations  Standing shoulder flexion walkback at counter 2x10 cues for comfortable ROM Supine shoulder flexion AAROM w/ dowel 2x8 cues for comfortable ROM and pacing  Supine chest press w/ dowel rod 2x8 cues for setup RTB bicep curl 2x8 BIL UE cues for posture Tricep pushdown RTB 2x8 BIL UE cues for setup and posture  Standing shoulder flexion iso 2x5 BIL cues for form and appropriate exertion Standing swiss ball GH flexion at table 2x10 cues for reduced UT compensations  Seated  shoulder shrugs x12 cues for form, emphasis on eccentric control and scapular depression, cues for breath control    PATIENT EDUCATION: Education details: rationale for interventions, HEP Person educated: Patient Education method: Explanation Education comprehension: verbalized understanding  HOME EXERCISE PROGRAM: Access Code: [email or web address] URL: [email or web address] Date: 12/26/2022 Prepared by: Fransisco Hertz  Exercises - Standing 'L' Stretch at Counter  - 1 x daily - 7 x weekly - 3 sets - 10 reps - Doorway Pec Stretch at 60 Elevation  - 1 x daily - 7 x weekly - 3 sets - 30 sec hold - Supine Shoulder Flexion Extension AAROM with Dowel  - 1 x daily - 7 x weekly - 2 sets - 15 reps - Standing Shoulder Row with Anchored Resistance  - 1 x daily - 7 x weekly - 2 sets - 15 reps - Supine Shoulder Horizontal Abduction with Resistance  - 1 x daily - 7 x weekly - 2 sets - 15 reps  added by: Garen Lah 12-28-22 Program Notes Sidelying on your Right side with Red theraband and your shoulder at 90/90 as shown in clinic  3 x 10  - Sidelying Shoulder ER with Towel and Dumbbell  - 1 x daily - 7 x weekly - 3 sets - 10 reps - sidelying Shoulder  Flexion to 90 degrees  - 1 x daily - 7 x weekly - 3 sets - 10 reps ASSESSMENT:  CLINICAL IMPRESSION: 01/12/2023 Pt arrives w/o pain, continues to endorse steady improvement. He saw MD  who told the patient he could continue HEP or PT treatments. Pt would like to continue PT treatments due to continued difficulty with right shoulder pain with lifting. Continued with bilateral shoulder strengthening. Pt fatigues with repetitions, more so on right rather than left. Fatigues quickly with resisted OH press.     OBJECTIVE IMPAIRMENTS: decreased activity tolerance, decreased endurance, decreased mobility, decreased ROM, decreased strength, hypomobility, impaired UE functional use, postural dysfunction, and pain.   ACTIVITY LIMITATIONS: carrying,  lifting, sitting, dressing, and reach over head  PARTICIPATION LIMITATIONS: meal prep, cleaning, and community activity  PERSONAL FACTORS: Age, Time since onset of injury/illness/exacerbation, and 3+ comorbidities: cardiac hx, hx DVT/PE, HTN  are also affecting patient's functional outcome.   REHAB POTENTIAL: Good  CLINICAL DECISION MAKING: Stable/uncomplicated  EVALUATION COMPLEXITY: Low   GOALS: Goals reviewed with patient? No given time constraints  SHORT TERM GOALS: Target date: 01/04/2023 Pt will demonstrate appropriate understanding and performance of initially prescribed HEP in order to facilitate improved independence with management of symptoms.  Baseline: HEP provided on eval Goal status: MET   2. Pt will score greater than or equal to 60 on FOTO in order to demonstrate improved perception of function due to symptoms.  Baseline: 52  Status 12-28-22  72%  Goal status: MET   LONG TERM GOALS: Target date: 02/01/2023 Pt will score 67 on FOTO in order to demonstrate improved perception of function due to symptoms. Baseline: 52 Status 12-28-22  72% Goal status: MET  2.  Pt will demonstrate at least 130 degrees of active shoulder elevation bilaterally in order to demonstrate improved tolerance to functional movement patterns such as upper body dressing and reaching overhead. Baseline: see ROM chart above Goal status: met  3.  Pt will demonstrate at least 1 pt increase in global shoulder MMT for improved symmetry of UE strength and improved tolerance to functional movements.  Baseline: see MMT chart above Goal status: ONGOING  4. Pt will report ability to sit for up to 1hr with less than 2 pt increase in pain in order to facilitate improved participation in community activities (theater)  Baseline: up to 4/10 with sitting 20-23min  12-28-22  2/10 today  01/09/23: 30-40 min up to 3/10  Goal status: ONGOING   5. Pt will demonstrate grossly symmetrical functional external  rotation (within 1 vertebral level) in order to facilitate improved tolerance to self care/ADL tasks.   Baseline: see ROM chart above  Goal status: ONGOING  6. Pt will report at least 50% decrease in overall pain levels in past week in order to facilitate improved tolerance to basic ADLs/mobility.   Baseline: 2-4/10 over past week  01/09/23: 0-3/10 over past week  Goal status:ONGOING   7. Pt will demonstrate ability to lift up to 30# with less than 2 pt increase in pain in order to facilitate improved tolerance to heavier household tasks.   Baseline: pain reported with 25-40#  01/09/23: 10-15# lifting with mild transient discomfort   Goal status: ONGOING  PLAN:  PT FREQUENCY: 1-2x/week (plan for 2x/week 4 weeks, then 1x/week 4 weeks, 13 visits total including initial evaluation)  PT DURATION: 8 weeks  PLANNED INTERVENTIONS: Therapeutic exercises, Therapeutic activity, Neuromuscular re-education, Balance training, Gait training, Patient/Family education, Self Care, Joint mobilization, Aquatic Therapy, Dry Needling, Electrical stimulation, Spinal mobilization, Cryotherapy, Moist heat, Taping, Manual therapy, and Re-evaluation  PLAN FOR NEXT SESSION: Review/update HEP PRN. Work on [employer] exercises as appropriate with emphasis on postural extension, periscapular/GH activation, and GH mobility. Symptom  modification strategies as indicated/appropriate.  Consider scheduling more visits vs discharge pending pt visit with MD and progress/preference ( pt would like to complete 2 more visits, pending sched availability/AUTH)   Jannette Spanner, PTA 01/12/23 10:05 AM Phone: [phone] Fax: [phone]

## 2023-01-15 ENCOUNTER — Other Ambulatory Visit: Payer: Self-pay | Admitting: Internal Medicine

## 2023-01-15 ENCOUNTER — Telehealth: Payer: Self-pay | Admitting: Nurse Practitioner

## 2023-01-15 MED ORDER — LINACLOTIDE 290 MCG PO CAPS: 290.0000 ug | ORAL_CAPSULE | Freq: Every day | ORAL | 1 refills | Status: DC

## 2023-01-15 NOTE — Telephone Encounter (Signed)
Inbound call from patient wanting a call back regarding Linzess , he states that pharmacy told him that his refill was denied and he want to know why. Please advise

## 2023-01-15 NOTE — Telephone Encounter (Signed)
Patient aware that Rx for Linzess once daily was sent to pharmacy.  Patient advised that he is overdue for clinic follow up.  Patient advised that he is scheduled to see Quentin Mulling, PA-C on 03-30-23 at [facility].  Patient agreed to plan and verbalized understanding.  No further questions.

## 2023-01-16 ENCOUNTER — Encounter: Payer: Self-pay | Admitting: Endocrinology

## 2023-01-16 ENCOUNTER — Ambulatory Visit: Payer: Medicare PPO | Admitting: Endocrinology

## 2023-01-16 VITALS — BP 110/72 | HR 70 | Ht 68.0 in | Wt 218.0 lb

## 2023-01-16 DIAGNOSIS — E119 Type 2 diabetes mellitus without complications: Secondary | ICD-10-CM

## 2023-01-16 DIAGNOSIS — E1169 Type 2 diabetes mellitus with other specified complication: Secondary | ICD-10-CM | POA: Diagnosis not present

## 2023-01-16 DIAGNOSIS — E669 Obesity, unspecified: Secondary | ICD-10-CM

## 2023-01-16 DIAGNOSIS — Z7984 Long term (current) use of oral hypoglycemic drugs: Secondary | ICD-10-CM

## 2023-01-16 MED ORDER — GLIMEPIRIDE 1 MG PO TABS
1.0000 mg | ORAL_TABLET | Freq: Every day | ORAL | 5 refills | Status: AC
Start: 2023-01-16 — End: ?

## 2023-01-16 NOTE — Progress Notes (Signed)
Patient ID: Noah Barnes, male   DOB: 12/22/1945, 77 y.o.   MRN: [id number]    Reason for Appointment : Follow-up visit - History of Present Illness          Diagnosis: Type 2 diabetes mellitus, date of diagnosis: 2008       Past history: His diabetes was borderline in the beginning and not clear what medications he was started on. Has been taking metformin for several years and this was later changed to Janumet. Amaryl probably started in 5/14 when blood sugars were higher, initially was given 1 mg and now has been taking 2 mg. He had a relatively high A1c of 7.1, difficulty losing weight and a glucose of 202 on his initial consultation in 06/2013 He was then tried on Invokana in addition to his Janumet and Amaryl Invokana was reduced to 100 mg in early 2015 been creatinine had gone up to 1.6  Recent history:   Oral hypoglycemic drugs the patient is taking are: Amaryl 2  mg at supper, Rybelsus 14 mg daily  His previous range 5.8-6.6 Fructosamine last 241  Current management, blood sugar patterns and problems identified:    He has taken his Rybelsus consistently on empty stomach in the morning as directed without any side effects and has no difficulty affording this His blood sugars at home are excellent near normal with only a couple of readings over 100 fasting Has only a couple of readings recently after dinner which are also in the similar range Lab glucose 114 He has not been able to lose any further weight He has been trying to continue walking about a mile on a few days each week He thinks he is trying to eat a healthy diet   Side effects from medications have been:  renal dysfunction from Invokana  Glucose monitoring:  done <1 times a day         Glucometer:  Accu-Chek   Blood Glucose readings by meter review:   PRE-MEAL Fasting Lunch Dinner Bedtime Overall  Glucose range: 92-123      Mean/median:     111   POST-MEAL PC Breakfast PC Lunch PC Dinner   Glucose range:   110, 111  Mean/median:        Previously  PRE-MEAL Fasting Lunch Dinner Bedtime Overall  Glucose range: 80-103      Mean/median:     101   POST-MEAL PC Breakfast PC Lunch PC Dinner  Glucose range:   99-144  Mean/median:        Glycemic control:   Lab Results  Component Value Date   HGBA1C 5.7 01/11/2023   HGBA1C 5.8 08/11/2022   HGBA1C 6.1 02/06/2022   Lab Results  Component Value Date   MICROALBUR 7.0 (H) 01/11/2023   LDLCALC 78 08/11/2022   CREATININE 1.60 (H) 01/11/2023    Self-care: Usually has low-fat diet, eggs in am with some carbohydrate, frequently no lunch     Dietician consultations: 08/2013              Weight history:   Wt Readings from Last 3 Encounters:  01/16/23 218 lb (98.9 kg)  01/10/23 220 lb (99.8 kg)  12/26/22 220 lb (99.8 kg)       Allergies as of 01/16/2023       Reactions   Ace Inhibitors Other (See Comments)   Severe AKI due to ARB + dehydration Aug 2016   Angiotensin Receptor Blockers Other (See Comments)   Severe AKI  due to ARB + dehydration Aug 2016        Medication List        Accurate as of January 16, 2023  8:47 AM. If you have any questions, ask your nurse or doctor.          Accu-Chek Guide Me w/Device Kit 1 each by Does not apply route in the morning and at bedtime. Use Accu Chek Guide me device to check blood sugar twice daily.   Accu-Chek Guide test strip Generic drug: glucose blood USE AS DIRECTED TO TEST BLOOD SUGAR TWICE DAILY   Accu-Chek Softclix Lancets lancets CHECK BLOOD SUGAR TWICE A DAY.   albuterol 108 (90 Base) MCG/ACT inhaler Commonly known as: VENTOLIN HFA Inhale 2 puffs into the lungs every 6 (six) hours as needed for wheezing or shortness of breath.   albuterol (2.5 MG/3ML) 0.083% nebulizer solution Commonly known as: PROVENTIL Take 3 mLs (2.5 mg total) by nebulization every 6 (six) hours as needed for wheezing or shortness of breath.   allopurinol 300 MG  tablet Commonly known as: ZYLOPRIM Take 1 tablet by mouth once daily   atorvastatin 20 MG tablet Commonly known as: LIPITOR Take 1 tablet by mouth once daily   Eliquis 2.5 MG Tabs tablet Generic drug: apixaban Take 1 tablet by mouth twice daily   Ferrex 150 150 MG capsule Generic drug: iron polysaccharides Take 1 capsule by mouth once daily   fluticasone-salmeterol 250-50 MCG/ACT Aepb Commonly known as: Advair Diskus Inhale 1 puff into the lungs in the morning and at bedtime.   furosemide 40 MG tablet Commonly known as: LASIX Take 40 mg by mouth daily.   glimepiride 1 MG tablet Commonly known as: AMARYL Take 1 tablet (1 mg total) by mouth daily with breakfast. What changed:  medication strength See the new instructions. Changed by: Reather Littler   hydrALAZINE 50 MG tablet Commonly known as: APRESOLINE Take 1 tablet (50 mg total) by mouth 2 (two) times daily. Annual appt due in Aug must see provider for future refills   linaclotide 290 MCG Caps capsule Commonly known as: Linzess Take 1 capsule (290 mcg total) by mouth daily before breakfast.   metoprolol tartrate 100 MG tablet Commonly known as: LOPRESSOR Take 1 tablet by mouth twice daily   Rybelsus 14 MG Tabs Generic drug: Semaglutide TAKE 1 TABLET BY MOUTH IN THE MORNING 30 MINUTES BEFORE BREAKFAST WITH SIP OF WATER   sildenafil 100 MG tablet Commonly known as: VIAGRA TAKE 1 TABLET BY MOUTH EVERY DAY AS NEEDED FOR ERECTILE DYSFUNCTION   Spiriva Respimat 2.5 MCG/ACT Aers Generic drug: Tiotropium Bromide Monohydrate Inhale 2 puffs into the lungs daily.   tamsulosin 0.4 MG Caps capsule Commonly known as: FLOMAX TAKE 1 CAPSULE BY MOUTH ONCE DAILY AFTER SUPPER        Allergies:  Allergies  Allergen Reactions   Ace Inhibitors Other (See Comments)    Severe AKI due to ARB + dehydration Aug 2016   Angiotensin Receptor Blockers Other (See Comments)    Severe AKI due to ARB + dehydration Aug 2016    Past  Medical History:  Diagnosis Date   CHF (congestive heart failure) (HCC)    Chronic diastolic heart failure (HCC)    Constipation    Diabetes mellitus    Diverticulitis    Diverticulosis    ED (erectile dysfunction)    Gout    Heart murmur    Hyperlipidemia    Hypertension    IBS (irritable bowel  syndrome)    Renal calculus    Renal insufficiency    Tubular adenoma of colon     Past Surgical History:  Procedure Laterality Date   COLONOSCOPY  1998   negative; no F/U   EXTRACORPOREAL SHOCK WAVE LITHOTRIPSY Left 03/20/2019   Procedure: EXTRACORPOREAL SHOCK WAVE LITHOTRIPSY (ESWL);  Surgeon: Malen Gauze, MD;  Location: [facility];  Service: Urology;  Laterality: Left;   knee effusion tapped      Family History  Problem Relation Age of Onset   Heart attack Mother 26   Hypertension Mother    Prostate cancer Father 36   Diabetes Father    Irritable bowel syndrome Father    Irritable bowel syndrome Sister        x 2   Colon cancer Brother        dx in his late 34's   Heart disease Brother    Irritable bowel syndrome Brother    Stroke Neg Hx    Esophageal cancer Neg Hx    Rectal cancer Neg Hx    Stomach cancer Neg Hx     Social History:  reports that he quit smoking about 31 years ago. His smoking use included cigarettes. He started smoking about 54 years ago. He has a 6.9 pack-year smoking history. He has never used smokeless tobacco. He reports that he does not currently use alcohol. He reports that he does not use drugs.    Review of Systems   RENAL dysfunction: Creatinine is as follows, followed by nephrologist  Lab Results  Component Value Date   CREATININE 1.60 (H) 01/11/2023   CREATININE 1.69 (H) 09/26/2022   CREATININE 1.56 (H) 08/11/2022   CREATININE 1.66 (H) 05/29/2022     Lab Results  Component Value Date   K 4.0 01/11/2023         Lipids: Has been on Lipitor 20 mg for hyperlipidemia, baseline LDL about 162 Followed by Dr. Lawerance Bach  Lab Results   Component Value Date   CHOL 142 08/11/2022   HDL 50.40 08/11/2022   LDLCALC 78 08/11/2022   LDLDIRECT 80.1 04/15/2014   TRIG 66.0 08/11/2022   CHOLHDL 3 08/11/2022       The blood pressure has been high for several years and usually well controlled.  Currently taking, hydralazine, and metoprolol from his PCP Also monitoring at home  BP Readings from Last 3 Encounters:  01/16/23 110/72  01/10/23 102/68  12/06/22 132/80      LABS:  Lab on 01/11/2023  Component Date Value Ref Range Status   Hgb A1c MFr Bld 01/11/2023 5.7  4.6 - 6.5 % Final   Glycemic Control Guidelines for People with Diabetes:Non Diabetic:  <6%Goal of Therapy: <7%Additional Action Suggested:  >8%    Sodium 01/11/2023 140  135 - 145 mEq/L Final   Potassium 01/11/2023 4.0  3.5 - 5.1 mEq/L Final   Chloride 01/11/2023 114 (H)  96 - 112 mEq/L Final   CO2 01/11/2023 19  19 - 32 mEq/L Final   Glucose, Bld 01/11/2023 107 (H)  70 - 99 mg/dL Final   BUN 95/28/4132 34 (H)  6 - 23 mg/dL Final   Creatinine, Ser 01/11/2023 1.60 (H)  0.40 - 1.50 mg/dL Final   GFR 44/07/270 41.74 (L)  >60.00 mL/min Final   Calculated using the CKD-EPI Creatinine Equation (2021)   Calcium 01/11/2023 9.2  8.4 - 10.5 mg/dL Final   Microalb, Ur 53/66/4403 7.0 (H)  0.0 - 1.9 mg/dL Final  Creatinine,U 01/11/2023 206.8  mg/dL Final   Microalb Creat Ratio 01/11/2023 3.4  0.0 - 30.0 mg/g Final    Physical Examination:  BP 110/72 (BP Location: Left Arm, Patient Position: Sitting, Cuff Size: Small)   Pulse 70   Ht 5\' 8"  (1.727 m)   Wt 218 lb (98.9 kg)   SpO2 95%   BMI 33.15 kg/m    Diabetic Foot Exam - Simple   Simple Foot Form Diabetic Foot exam was performed with the following findings: Yes   Visual Inspection No deformities, no ulcerations, no other skin breakdown bilaterally: Yes Sensation Testing Intact to touch and monofilament testing bilaterally: Yes Pulse Check Posterior Tibialis and Dorsalis pulse intact bilaterally:  Yes Comments    1+ edema present  ASSESSMENT/PLAN:   Diabetes type 2, with obesity  See history of present illness for detailed discussion of current diabetes management, blood sugar patterns and problems identified  He is on Rybelsus 14 mg and Amaryl 2 mg  A1c is 5.7  His blood sugars are still excellent at home with some readings at different times He is also trying to work on his diet and try to walk regularly although it has been difficult to improve despite taking Rybelsus   Continue the same dose of Rybelsus but reduce Amaryl to 1 mg daily to avoid potential hypoglycemia  RENAL dysfunction: His creatinine is stable However he will likely benefit from taking [place] or Jardiance and he will need to discuss with his nephrologist   There are no Patient Instructions on file for this visit.    Reather Littler 01/16/2023, 8:47 AM

## 2023-01-16 NOTE — Patient Instructions (Signed)
Ask about Noah Barnes  Take 1mg  Amaryl

## 2023-01-18 ENCOUNTER — Other Ambulatory Visit: Payer: Self-pay | Admitting: Internal Medicine

## 2023-01-18 NOTE — Therapy (Addendum)
 OUTPATIENT PHYSICAL THERAPY TREATMENT NOTE/DISCHARGE NOTE PHYSICAL THERAPY DISCHARGE SUMMARY  Visits from Start of Care: 9  Current functional level related to goals / functional outcomes: As indicated below   Remaining deficits: Deficit in ER Right of 60 degrees but improved in MMT/ AROM in all other planes   Education / Equipment: HEP   Patient agrees to discharge. Patient goals were met. Patient is being discharged due to being pleased with the current functional level. And most goals met and one partially met in LTG"s   Patient Name: Noah Barnes MRN: [id number] DOB:10-24-1945, 77 y.o., male Today's Date: 01/22/2023  END OF SESSION:  PT End of Session - 01/22/23 1021     Visit Number 9    Number of Visits 13    Date for PT Re-Evaluation 02/01/23    Authorization Type [employer]    Authorization Time Period Approved 12 visits 12/11/22-01/20/23: requested date extension 01/12/23- JD    Authorization - Visit Number 9    Authorization - Number of Visits 12    Progress Note Due on Visit 10    PT Start Time 1017    PT Stop Time 1100    PT Time Calculation (min) 43 min    Activity Tolerance Patient tolerated treatment well;No increased pain    Behavior During Therapy WFL for tasks assessed/performed                   Past Medical History:  Diagnosis Date   CHF (congestive heart failure) (HCC)    Chronic diastolic heart failure (HCC)    Constipation    Diabetes mellitus    Diverticulitis    Diverticulosis    ED (erectile dysfunction)    Gout    Heart murmur    Hyperlipidemia    Hypertension    IBS (irritable bowel syndrome)    Renal calculus    Renal insufficiency    Tubular adenoma of colon    Past Surgical History:  Procedure Laterality Date   COLONOSCOPY  1998   negative; no F/U   EXTRACORPOREAL SHOCK WAVE LITHOTRIPSY Left 03/20/2019   Procedure: EXTRACORPOREAL SHOCK WAVE LITHOTRIPSY (ESWL);  Surgeon: Malen Gauze, MD;  Location: [facility];  Service: Urology;  Laterality: Left;   knee effusion tapped     Patient Active Problem List   Diagnosis Date Noted   Acute pain of both shoulders 10/31/2022   Right shoulder pain 10/31/2022   Acute cough 04/19/2021   Right-sided chest pain 04/19/2021   Chest congestion 04/15/2021   Acquired trigger finger of right little finger 12/30/2020   Bilateral primary osteoarthritis of knee 12/30/2020   Aortic atherosclerosis (HCC) 12/29/2020   Chronic bronchitis (HCC) 07/28/2020   BPH (benign prostatic hyperplasia) 07/05/2020   Left lower quadrant abdominal pain 11/28/2019   Lung nodule - stable 04/04/2019   History of DVT of lower extremity, b/l and PE 04/03/2019   History of pulmonary embolus (PE) 04/03/2019   Hypomagnesemia 04/03/2019   Nephrolithiasis 03/06/2019   Seasonal allergic rhinitis 05/22/2018   MR (mitral regurgitation) 12/20/2017   CKD (chronic kidney disease) stage 3, GFR 30-59 ml/min (HCC) 08/14/2017   Abdominal hernia without obstruction and without gangrene 01/10/2016   Chronic diastolic heart failure (HCC) 02/25/2015   Right renal mass 02/25/2015   Other constipation 02/22/2015   Chronic venous insufficiency 01/18/2015   Superficial phlebitis 01/18/2015   IBS (irritable bowel syndrome) 11/12/2013   Family history of prostate cancer 12/26/2011   Diabetes mellitus with  renal complications (HCC) 12/18/2008   Gout 12/31/2007   HYPERLIPIDEMIA 08/14/2007   ERECTILE DYSFUNCTION 05/09/2007   Essential hypertension 11/28/2006    PCP: Pincus Sanes, MD  REFERRING PROVIDER: Richardean Sale, DO  REFERRING DIAG: M25.511,G89.29,M25.512 (ICD-10-CM) - Chronic pain of both shoulders  THERAPY DIAG:  Bilateral shoulder pain, unspecified chronicity  Abnormal posture  Muscle weakness (generalized)  Stiffness of right shoulder, not elsewhere classified  Stiffness of left shoulder, not elsewhere classified  Rationale for Evaluation and Treatment:  Rehabilitation  ONSET DATE: at least 6-7 months   SUBJECTIVE STATEMENT: Right shoulder  at rest is 1/10  can still be painful if lifting. 4/10. No pain at rest. I saw the doctor who said I can decide if I want more PT. I think I will do a couple more visits.       PERTINENT HISTORY: CHF, DM, HTN, renal history, hx DVT/PE. Pt states all well controlled  PAIN:  Are you having pain? Yes: NPRS scale: 0/10 Pain location: bilateral shoulders Pain description: sore Aggravating factors: cold air Relieving factors: heat   PRECAUTIONS: cardiac hx, hx DVT/PE  WEIGHT BEARING RESTRICTIONS: No    OBJECTIVE: (objective measures completed at initial evaluation unless otherwise dated)   DIAGNOSTIC FINDINGS:  Refer to [facility] for impression of BIL shoulder XR (11/01/22), no acute issues noted per impressions  PATIENT SURVEYS:  FOTO 52 current, 67 predicted Status 12-28-22  72%  COGNITION: Overall cognitive status: Within functional limits for tasks assessed     SENSATION: Light touch intact BIL UE   POSTURE: Significant kyphosis and forward head posture, RUE held in more IR than L  UPPER EXTREMITY ROM:  A/PROM Right eval Left eval 12/15/22 12/21/22 12-28-22 01/09/23 R/L 01-22-23  Shoulder flexion 100 deg * 108 deg * Lt 138; Rt: 134 in standing  Rt: 150; Lt: 153 in standing  RT 155 LT 153  standing 159 deg / 156 deg  160/156  Shoulder abduction 88 deg * 102 deg *     130  Shoulder internal rotation       60  Shoulder external rotation (functional combo) Occiput * C6 *     C-6/C-6  Elbow flexion         Elbow extension         Wrist flexion         Wrist extension          (Blank rows = not tested) (Key: WFL = within functional limits not formally assessed, * = concordant pain, s = stiffness/stretching sensation, NT = not tested)  Comments:    UPPER EXTREMITY MMT:  MMT Right eval Left eval R/L 01-22-23  Shoulder flexion 4- * 4- 4+/5  Shoulder extension     Shoulder abduction  4 * 4+ 4+/5  Shoulder internal rotation 4- 4 4/4+  Shoulder external rotation 3+ * 4 4/4+  Elbow flexion     Elbow extension     Grip strength     (Blank rows = not tested)  (Key: WFL = within functional limits not formally assessed, * = concordant pain, s = stiffness/stretching sensation, NT = not tested)  Comments:   SHOULDER SPECIAL TESTS: NT  JOINT MOBILITY TESTING:  NT  PALPATION:  Concordant tenderness BIL deltoids, infraspinatus, teres minor/major. Generalized tightness upper traps and rhomboids bilat   TODAY'S TREATMENT:   [facility] Adult PT Treatment:  DATE: 01-22-23 Therapeutic Exercise:  Row blue 10 x 2  Sidelying with blue t band and 90/90  Ext  blue x 20 Pec stretch in doorway 5 sec x 10 Standing W back x 15 Standing green band ER bilat 10 x 2  Supine chest Press 5# x 15 Standing blue t band bil shld flexion 2 x 10 Standing blue t band bil standing scaption 2 x 10  Self Care: Education on community wellness and progressive strengthening   [facility] Adult PT Treatment:                                                DATE: 01/12/23 Therapeutic Exercise: Pulleys  5# bicep curls 10 x  2  Row green 10 x 2  Ext green x 20 Pec stretch in doorway 5 sec x 10 Standing W back x 15 Standing red band ER bilat 10 x 2  Supine chest Press 4# x 15 Supine pullover with dowel 4# x 10 Sidelying shoulder flexion + horiz abdct+ eccentric abduction 2# x 10, bilat S/L  shoulder ER 2# 10 x 1 , AROM x 10, both sides Seated dowel pullover long lever x 12 Seated dowel OH press 4# on dowel x 6    [facility] Adult PT Treatment:                                                DATE: 01/09/23 Therapeutic Exercise: Pulleys flex/scaption each cues for pacing Supine chest press 3# x12, x20  cues for form and pacing Supine GH flex AAROM w/ dowel + 3# x12, x20 Standing green band row 2x15 cues for posture and setup 4# bicep curl x10 BIL Shoulder flexion  walkbacks at counter 2x10 cues for appropriate ROM and pacing Supine double ER red band 2x10 cues for comfortable ROM   Therapeutic Activity: Floor to shoulder lift 10# 6 reps 10# floor to counter lift 6 reps, 15# 6 reps   [facility] Adult PT Treatment:                                                DATE: 12-28-22 FOTO  Therapeutic Exercise: Pulley flexion/scaption each cues for good posture comfortable ROM  Supine GH flexion AAROM w/ dowel + 2 # cuff weight  2x10 cues for comfortable ROM  slight incline on table Supine GH ER fall outs at 45 deg abd 2x10  with 2 # DB RUE only cues for reduced elbow compensations Sidelying ER 2x12 BIL 2# cues for shoulder positioning and appropriate ROM Sidelying GH abduction 2# 1x20 LUE only Sidelying GH flexion with 1#  DB 1 x 10, then 2 # DB cues for alignment Seated OH press with dowel and 2 # cuff weight 2 x 10 Manual Therapy: GH mobs inf post glide/ and ER Periscapular DTW  [facility] Adult PT Treatment:                                                DATE: 12/26/22 Therapeutic Exercise: Pulley flexion/scaption each cues for comfortable ROM  Supine GH flexion AAROM w/ dowel + 1# 3 x10 cues for comfortable ROM (shld feels better with stretch) Supine GH ER fall outs at 45 deg abd 2x10 RUE only cues for reduced elbow compensations Sidelying ER 2x12 BIL 1# cues for shoulder positioning and appropriate ROM Sidelying GH abduction 1# 2x10 LUE only (discontinued on R due to discomfort in lateral shoulder) Standing green band rows x15 cues for setup, blue band x10 cues for posture  Physioball rollout 2x56min cues for comfortable ROM HEP update/education   [facility] Adult PT Treatment:                                                DATE: 12/21/22 Therapeutic Exercise: Pulleys flexion/scaption x 2 minutes each  Supine shoulder flexion AAROM  with dowel x 10  Sidelying ER 2 x 10 @ 1 lb Sidelying shoulder abduction 2 x 10 @ 1 lb  Resisted shoulder extension yellow band 2 x 15  Resisted rows green band 2 x 15 Physioball rollout x 1 minute    [facility] Adult PT Treatment:                                                DATE: 12/15/22 Therapeutic Exercise: Supine shoulder flexion AAROM with dowel x 10 Sidelying shoulder abduction 2 x 10  Sidelying shoulder ER 2 x 10  Pec doorway stretch 2 x 30 sec Pulleys flexion x 2 minutes Resisted rows blue band 2 x 10  Supine resisted horizontal shoulder abduction red band 2 x 10  Bilateral shoulder ER yellow band 2 x 10  Shoulder flexion towel slides at wall x 5 each; 5 sec hold  Updated HEP     [facility] Adult PT Treatment:                                                DATE: 12/14/22 Therapeutic Exercise: Scapular retraction 2x10 cues for reduced UT compensations  Standing shoulder flexion walkback at counter 2x10 cues for comfortable ROM Supine shoulder flexion AAROM w/ dowel 2x8 cues for comfortable ROM and pacing  Supine chest press w/ dowel rod 2x8 cues for setup RTB bicep curl 2x8 BIL UE cues for posture Tricep pushdown RTB 2x8 BIL UE cues for setup and posture  Standing shoulder flexion iso 2x5 BIL cues for form and appropriate exertion Standing swiss ball GH flexion at table 2x10 cues for reduced UT compensations  Seated shoulder shrugs x12 cues for form, emphasis on eccentric control and scapular depression, cues for breath control    PATIENT EDUCATION: Education details: rationale for interventions, HEP Person educated: Patient Education method: Explanation Education comprehension: verbalized understanding  HOME EXERCISE PROGRAM: Access Code: [email or web address] URL: [email or web address] Date: 12/26/2022  Prepared by: Fransisco Hertz  Exercises - Standing 'L' Stretch at Counter  - 1 x daily - 7 x weekly - 3 sets - 10 reps - Doorway Pec Stretch at 60 Elevation  - 1 x  daily - 7 x weekly - 3 sets - 30 sec hold - Supine Shoulder Flexion Extension AAROM with Dowel  - 1 x daily - 7 x weekly - 2 sets - 15 reps - Standing Shoulder Row with Anchored Resistance  - 1 x daily - 7 x weekly - 2 sets - 15 reps - Supine Shoulder Horizontal Abduction with Resistance  - 1 x daily - 7 x weekly - 2 sets - 15 reps  added by: Garen Lah 12-28-22 Program Notes Sidelying on your Right side with Red theraband and your shoulder at 90/90 as shown in clinic  3 x 10  - Sidelying Shoulder ER with Towel and Dumbbell  - 1 x daily - 7 x weekly - 3 sets - 10 reps - sidelying Shoulder  Flexion to 90 degrees  - 1 x daily - 7 x weekly - 3 sets - 10 reps Access Code: [email or web address] URL: [email or web address] Date: 01/22/2023 Prepared by: Garen Lah Added 01-22-23 Program Notes Sidelying on your Right side with Red theraband and your shoulder at 90/90 as shown in clinic  3 x 10  - Standing Shoulder Flexion with Resistance  - 1 x daily - 7 x weekly - 3 sets - 10 reps - Standing Shoulder Abduction with Resistance  - 1 x daily - 7 x weekly - 3 sets - 10 reps ASSESSMENT:  CLINICAL IMPRESSION: 01/22/2023 Pt arrives w/o pain and rises to 1/10 with exercise.  Pt R ER of shld maxed at 60 degrees at 90 degree abduction but has much improved flex/abduction from evaluation.  Pt ,continues to endorse steady improvement. Pt has achieved all LTG's and improved in all areas.  Pt did not meet 30# OH goal but is able to work with 15 # and can raise a 20# barbell to head level.  PT modified HEP for home use and pt has stated he would like to continue by attending gym at the [facility]  Pt would benefit from a personal trainer in order to learn how to appropriately use the gym equipment available for use at his local gym.   Pt does fatigue with repetitions and was encouraged to continue with a progressive program at his local gym once he masters the HEP given today.  Pt is a joy for whom to serve.  Will DC as per pt request and all due to all LTG being met except for 30 pound overhead press in which he can do 15 #/  Will DC    OBJECTIVE IMPAIRMENTS: decreased activity tolerance, decreased endurance, decreased mobility, decreased ROM, decreased strength, hypomobility, impaired UE functional use, postural dysfunction, and pain.   ACTIVITY LIMITATIONS: carrying, lifting, sitting, dressing, and reach over head  PARTICIPATION LIMITATIONS: meal prep, cleaning, and community activity  PERSONAL FACTORS: Age, Time since onset of injury/illness/exacerbation, and 3+ comorbidities: cardiac hx, hx DVT/PE, HTN  are also affecting patient's functional outcome.   REHAB POTENTIAL: Good  CLINICAL DECISION MAKING: Stable/uncomplicated  EVALUATION COMPLEXITY: Low   GOALS: Goals reviewed with patient? No given time constraints  SHORT TERM GOALS: Target date: 01/04/2023 Pt will demonstrate appropriate understanding and performance of initially prescribed HEP in order to facilitate improved independence with management of symptoms.  Baseline: HEP provided on eval Goal status: MET  2. Pt will score greater than or equal to 60 on FOTO in order to demonstrate improved perception of function due to symptoms.  Baseline: 52  Status 12-28-22  72%  Goal status: MET   LONG TERM GOALS: Target date: 02/01/2023 Pt will score 67 on FOTO in order to demonstrate improved perception of function due to symptoms. Baseline: 52 Status 12-28-22  72% Goal status: MET  2.  Pt will demonstrate at least 130 degrees of active shoulder elevation bilaterally in order to demonstrate improved tolerance to functional movement patterns such as upper body dressing and reaching overhead. Baseline: see ROM chart above Goal status: MET  3.  Pt will demonstrate at least 1 pt increase in global shoulder MMT for improved symmetry of UE strength and improved tolerance to functional movements.  Baseline: see MMT chart above Goal  status: MMT  4. Pt will report ability to sit for up to 1hr with less than 2 pt increase in pain in order to facilitate improved participation in community activities (theater)  Baseline: up to 4/10 with sitting 20-47min  12-28-22  2/10 today  01/09/23: 30-40 min up to 3/10  01-22-23 reports able to sit in movie and use exercise   Goal status: MET  5. Pt will demonstrate grossly symmetrical functional external rotation (within 1 vertebral level) in order to facilitate improved tolerance to self care/ADL tasks.   Baseline: see ROM chart above  Goal status: MET  6. Pt will report at least 50% decrease in overall pain levels in past week in order to facilitate improved tolerance to basic ADLs/mobility.   Baseline: 2-4/10 over past week  01/09/23: 0-3/10 over past week 01-22-23  1/10 today  Goal status: MET   7. Pt will demonstrate ability to lift up to 30# with less than 2 pt increase in pain in order to facilitate improved tolerance to heavier household tasks.   Baseline: pain reported with 25-40#  01/09/23: 10-15# lifting with mild transient discomfort   01-22-23  using 20# with bil UE in line  with head  with 1/10 pain level but unable to do 25 #  Goal status: Partially Met   PLAN:  PT FREQUENCY: 1-2x/week (plan for 2x/week 4 weeks, then 1x/week 4 weeks, 13 visits total including initial evaluation)  PT DURATION: 8 weeks  PLANNED INTERVENTIONS: Therapeutic exercises, Therapeutic activity, Neuromuscular re-education, Balance training, Gait training, Patient/Family education, Self Care, Joint mobilization, Aquatic Therapy, Dry Needling, Electrical stimulation, Spinal mobilization, Cryotherapy, Moist heat, Taping, Manual therapy, and Re-evaluation  PLAN FOR NEXT SESSION: Review/update HEP PRN. Work on [employer] exercises as appropriate with emphasis on postural extension, periscapular/GH activation, and GH mobility. Symptom modification strategies as indicated/appropriate.  Consider scheduling  more visits vs discharge pending pt visit with MD and progress/preference ( pt would like to complete 2 more visits, pending sched availability/AUTH)   Garen Lah, PT, ATRIC Certified Exercise Expert for the Aging Adult  01/22/23 1:02 PM Phone: [phone] Fax: [phone]   Garen Lah, PT, ATRIC Certified Exercise Expert for the Aging Adult  08/30/23 2:00 PM Phone: [phone] Fax: [phone]

## 2023-01-22 ENCOUNTER — Ambulatory Visit: Payer: Medicare PPO | Admitting: Physical Therapy

## 2023-01-22 ENCOUNTER — Encounter: Payer: Self-pay | Admitting: Physical Therapy

## 2023-01-22 DIAGNOSIS — M25512 Pain in left shoulder: Secondary | ICD-10-CM | POA: Diagnosis not present

## 2023-01-22 DIAGNOSIS — M25611 Stiffness of right shoulder, not elsewhere classified: Secondary | ICD-10-CM

## 2023-01-22 DIAGNOSIS — M6281 Muscle weakness (generalized): Secondary | ICD-10-CM | POA: Diagnosis not present

## 2023-01-22 DIAGNOSIS — R293 Abnormal posture: Secondary | ICD-10-CM | POA: Diagnosis not present

## 2023-01-22 DIAGNOSIS — M25612 Stiffness of left shoulder, not elsewhere classified: Secondary | ICD-10-CM

## 2023-01-22 DIAGNOSIS — M25511 Pain in right shoulder: Secondary | ICD-10-CM

## 2023-01-25 ENCOUNTER — Encounter: Payer: Self-pay | Admitting: Physical Therapy

## 2023-01-26 ENCOUNTER — Ambulatory Visit: Payer: Medicare PPO | Admitting: Physical Therapy

## 2023-01-26 DIAGNOSIS — H524 Presbyopia: Secondary | ICD-10-CM | POA: Diagnosis not present

## 2023-01-26 DIAGNOSIS — E119 Type 2 diabetes mellitus without complications: Secondary | ICD-10-CM | POA: Diagnosis not present

## 2023-01-26 DIAGNOSIS — H5203 Hypermetropia, bilateral: Secondary | ICD-10-CM | POA: Diagnosis not present

## 2023-01-26 DIAGNOSIS — H2513 Age-related nuclear cataract, bilateral: Secondary | ICD-10-CM | POA: Diagnosis not present

## 2023-01-26 DIAGNOSIS — H35033 Hypertensive retinopathy, bilateral: Secondary | ICD-10-CM | POA: Diagnosis not present

## 2023-01-31 ENCOUNTER — Other Ambulatory Visit: Payer: Self-pay | Admitting: Internal Medicine

## 2023-01-31 ENCOUNTER — Other Ambulatory Visit: Payer: Self-pay | Admitting: Endocrinology

## 2023-01-31 DIAGNOSIS — E1169 Type 2 diabetes mellitus with other specified complication: Secondary | ICD-10-CM

## 2023-02-04 ENCOUNTER — Other Ambulatory Visit: Payer: Self-pay | Admitting: Internal Medicine

## 2023-02-08 ENCOUNTER — Encounter: Payer: Self-pay | Admitting: Internal Medicine

## 2023-02-08 NOTE — Progress Notes (Signed)
Noah Barnes D.[name] [address] [place] [address] Phone: [phone]   Assessment and Plan:     1. Chronic pain of both shoulders  -Chronic with exacerbation, subsequent visit - Overall improvement in flare of bilateral shoulder pain that has improved with topical sprays, Tylenol, HEP, physical therapy - Patient has completed physical therapy, so I recommend he continue HEP to maintain range of motion - May continue Tylenol as needed for day-to-day pain relief - May continue topical gel, Biofreeze, or other topical sprays creams as needed for pain relief - I do not recommend prescription strength versions of NSAIDs or prednisone due to past medical history of DM type II, chronic anticoagulation on Eliquis - We discussed that we could trial bilateral subacromial CSI's which may improve pain and ROM.  Patient declines at this time, though would consider at future visit if no improvement or worsening of symptoms  Pertinent previous records reviewed include none   Follow Up: As needed.We discussed that we could trial bilateral subacromial CSI's which may improve pain and ROM.  Patient declines at this time, though would consider at future visit if no improvement or worsening of symptoms   Subjective:   I, Noah Barnes, am serving as a Neurosurgeon for Doctor Richardean Sale   Chief Complaint: bilat shoulder pain    HPI:    11/01/22 Patient is a 77 year old male complaining of bilateral shoulder pain. Patient states that he thinks he has arthritis bilat, states he fell on his right shoulder a month ago, pain radiates down his arm, left isnt as bad as the right , no meds for the pain, he uses a cream that gets hot and that helps , decreased ROM he feels a twinge , if his arms are at rest for a long period of time he has pain    12/06/2022 Patient states he is able to tolerate the pain before he gets the needle, he uses tylenol if it gets bad  along with the Voltaren. 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   Current Outpatient Medications:    ACCU-CHEK GUIDE test strip, USE AS DIRECTED TO TEST BLOOD SUGAR TWICE DAILY, Disp: 100 strip, Rfl: 3   Accu-Chek Softclix Lancets lancets, CHECK BLOOD SUGAR TWICE A DAY., Disp: 100 each, Rfl: 2   albuterol (PROVENTIL) (2.5 MG/3ML) 0.083% nebulizer solution, Take 3 mLs (2.5 mg total) by nebulization every 6 (six) hours as needed for wheezing or shortness of breath., Disp: 75 mL, Rfl: 5   albuterol (VENTOLIN HFA) 108 (90 Base) MCG/ACT inhaler, Inhale 2 puffs into the lungs every 6 (six) hours as needed for wheezing or shortness of breath., Disp: 8 g, Rfl: 0   allopurinol (ZYLOPRIM) 300 MG tablet, Take 1 tablet by mouth once daily, Disp: 90 tablet, Rfl: 0   atorvastatin (LIPITOR) 20 MG tablet, Take 1 tablet by mouth once daily, Disp: 90 tablet, Rfl: 0   Blood Glucose Monitoring Suppl (ACCU-CHEK GUIDE ME) w/Device KIT, 1 each by Does not apply route in the morning and at bedtime. Use Accu Chek Guide me device to check blood sugar twice daily., Disp: 1 kit, Rfl: 0  ELIQUIS 2.5 MG TABS tablet, Take 1 tablet by mouth twice daily, Disp: 180 tablet, Rfl: 0   FERREX 150 150 MG capsule, Take 1 capsule by mouth once daily, Disp: 90 capsule, Rfl: 0   fluticasone-salmeterol (ADVAIR DISKUS) 250-50 MCG/ACT AEPB, Inhale 1 puff into the lungs in the morning and at bedtime., Disp: 60 each, Rfl: 5   furosemide (LASIX) 40 MG tablet, Take 40 mg by mouth daily., Disp: , Rfl:    glimepiride (AMARYL) 1 MG tablet, Take 1 tablet (1 mg total) by mouth daily with breakfast., Disp: 30 tablet, Rfl: 5    hydrALAZINE (APRESOLINE) 50 MG tablet, Take 1 tablet (50 mg total) by mouth 2 (two) times daily. Annual appt due in Aug must see provider for future refills, Disp: 180 tablet, Rfl: 0   linaclotide (LINZESS) 290 MCG CAPS capsule, Take 1 capsule (290 mcg total) by mouth daily before breakfast., Disp: 90 capsule, Rfl: 1   metoprolol tartrate (LOPRESSOR) 100 MG tablet, Take 1 tablet by mouth twice daily, Disp: 180 tablet, Rfl: 0   Semaglutide (RYBELSUS) 14 MG TABS, TAKE 1 TABLET BY MOUTH IN THE MORNING 30 MINUTES BEFORE BREAKFAST WITH SIP OF WATER, Disp: 30 tablet, Rfl: 3   sildenafil (VIAGRA) 100 MG tablet, TAKE 1 TABLET BY MOUTH EVERY DAY AS NEEDED FOR ERECTILE DYSFUNCTION, Disp: 6 tablet, Rfl: 1   tamsulosin (FLOMAX) 0.4 MG CAPS capsule, TAKE 1 CAPSULE BY MOUTH ONCE DAILY AFTER SUPPER, Disp: 90 capsule, Rfl: 0   Tiotropium Bromide Monohydrate (SPIRIVA RESPIMAT) 2.5 MCG/ACT AERS, Inhale 2 puffs into the lungs daily., Disp: 4 g, Rfl: 5   Objective:     Vitals:   02/09/23 0837  Pulse: 74  SpO2: 98%  Weight: 222 lb (100.7 kg)  Height: 5\' 8"  (1.727 m)      Body mass index is 33.75 kg/m.    Physical Exam:    Gen: Appears well, nad, nontoxic and pleasant Neuro:sensation intact, strength is 5/5 with df/pf/inv/ev, muscle tone wnl Skin: no suspicious lesion or defmority Psych: A&O, appropriate mood and affect   Right shoulder:  No deformity, swelling or muscle wasting No scapular winging FF 180, abd 170, int 5, ext 75 NTTP over the [place], clavicle, ac, coracoid, biceps groove, humerus, deltoid, trapezius, cervical spine Positive Hawkins, empty can, O'Brien Neg neer,   crossarm, subscap liftoff, speeds Neg ant drawer, sulcus sign, apprehension Negative Spurling's test bilat FROM of neck     Electronically signed by:  Noah Barnes D.[name] 8:47 AM 02/09/23

## 2023-02-08 NOTE — Patient Instructions (Addendum)
      Medications changes include :   none       Return in about 6 months (around 08/12/2023) for Physical Exam.

## 2023-02-08 NOTE — Progress Notes (Signed)
Subjective:    Patient ID: Noah Barnes, male    DOB: Feb 04, 1946, 77 y.o.   MRN: [id number]     HPI Noah Barnes is here for follow up of his chronic medical problems.  Up to date to with nephro, endo.   Doing well.    Doing less exercise -- will start to increase.  Just finished PT for his shoulder.     Medications and allergies reviewed with patient and updated if appropriate.  Current Outpatient Medications on File Prior to Visit  Medication Sig Dispense Refill   ACCU-CHEK GUIDE test strip USE AS DIRECTED TO TEST BLOOD SUGAR TWICE DAILY 100 strip 3   Accu-Chek Softclix Lancets lancets CHECK BLOOD SUGAR TWICE A DAY. 100 each 2   albuterol (PROVENTIL) (2.5 MG/3ML) 0.083% nebulizer solution Take 3 mLs (2.5 mg total) by nebulization every 6 (six) hours as needed for wheezing or shortness of breath. 75 mL 5   albuterol (VENTOLIN HFA) 108 (90 Base) MCG/ACT inhaler Inhale 2 puffs into the lungs every 6 (six) hours as needed for wheezing or shortness of breath. 8 g 0   allopurinol (ZYLOPRIM) 300 MG tablet Take 1 tablet by mouth once daily 90 tablet 0   atorvastatin (LIPITOR) 20 MG tablet Take 1 tablet by mouth once daily 90 tablet 0   Blood Glucose Monitoring Suppl (ACCU-CHEK GUIDE ME) w/Device KIT 1 each by Does not apply route in the morning and at bedtime. Use Accu Chek Guide me device to check blood sugar twice daily. 1 kit 0   ELIQUIS 2.5 MG TABS tablet Take 1 tablet by mouth twice daily 180 tablet 0   FERREX 150 150 MG capsule Take 1 capsule by mouth once daily 90 capsule 0   fluticasone-salmeterol (ADVAIR DISKUS) 250-50 MCG/ACT AEPB Inhale 1 puff into the lungs in the morning and at bedtime. 60 each 5   furosemide (LASIX) 40 MG tablet Take 40 mg by mouth daily.     glimepiride (AMARYL) 1 MG tablet Take 1 tablet (1 mg total) by mouth daily with breakfast. 30 tablet 5   hydrALAZINE (APRESOLINE) 50 MG tablet Take 1 tablet (50 mg total) by mouth 2 (two) times daily. Annual appt  due in Aug must see provider for future refills 180 tablet 0   linaclotide (LINZESS) 290 MCG CAPS capsule Take 1 capsule (290 mcg total) by mouth daily before breakfast. 90 capsule 1   metoprolol tartrate (LOPRESSOR) 100 MG tablet Take 1 tablet by mouth twice daily 180 tablet 0   Semaglutide (RYBELSUS) 14 MG TABS TAKE 1 TABLET BY MOUTH IN THE MORNING 30 MINUTES BEFORE BREAKFAST WITH SIP OF WATER 30 tablet 3   sildenafil (VIAGRA) 100 MG tablet TAKE 1 TABLET BY MOUTH EVERY DAY AS NEEDED FOR ERECTILE DYSFUNCTION 6 tablet 1   tamsulosin (FLOMAX) 0.4 MG CAPS capsule TAKE 1 CAPSULE BY MOUTH ONCE DAILY AFTER SUPPER 90 capsule 0   Tiotropium Bromide Monohydrate (SPIRIVA RESPIMAT) 2.5 MCG/ACT AERS Inhale 2 puffs into the lungs daily. 4 g 5   Vitamin D, Ergocalciferol, (DRISDOL) 1.25 MG (50000 UNIT) CAPS capsule Take 50,000 Units by mouth once a week.     No current facility-administered medications on file prior to visit.     Review of Systems  Constitutional:  Negative for fever.  Respiratory:  Negative for cough, shortness of breath and wheezing.   Cardiovascular:  Positive for leg swelling (mild at times). Negative for chest pain and palpitations.  Gastrointestinal:  Positive for abdominal distention (at times - bloated) and constipation (controlled). Negative for abdominal pain.  Neurological:  Negative for light-headedness and headaches.       Objective:   Vitals:   02/09/23 0852  BP: 120/80  Pulse: 64  Temp: 98.2 F (36.8 C)  SpO2: 96%   BP Readings from Last 3 Encounters:  02/09/23 120/80  01/16/23 110/72  01/10/23 102/68   Wt Readings from Last 3 Encounters:  02/09/23 221 lb (100.2 kg)  02/09/23 222 lb (100.7 kg)  01/16/23 218 lb (98.9 kg)   Body mass index is 33.6 kg/m.    Physical Exam Constitutional:      General: He is not in acute distress.    Appearance: Normal appearance. He is not ill-appearing.  HENT:     Head: Normocephalic and atraumatic.  Eyes:      Conjunctiva/sclera: Conjunctivae normal.  Cardiovascular:     Rate and Rhythm: Normal rate and regular rhythm.     Heart sounds: Normal heart sounds.  Pulmonary:     Effort: Pulmonary effort is normal. No respiratory distress.     Breath sounds: Normal breath sounds. No wheezing or rales.  Musculoskeletal:     Right lower leg: Edema (mild) present.     Left lower leg: Edema (mild) present.  Skin:    General: Skin is warm and dry.     Findings: No rash.  Neurological:     Mental Status: He is alert. Mental status is at baseline.  Psychiatric:        Mood and Affect: Mood normal.        Lab Results  Component Value Date   WBC 3.8 (L) 09/26/2022   HGB 13.2 09/26/2022   HCT 41.2 09/26/2022   PLT 188 09/26/2022   GLUCOSE 107 (H) 01/11/2023   CHOL 142 08/11/2022   TRIG 66.0 08/11/2022   HDL 50.40 08/11/2022   LDLDIRECT 80.1 04/15/2014   LDLCALC 78 08/11/2022   ALT 48 (H) 09/26/2022   AST 21 09/26/2022   NA 140 01/11/2023   K 4.0 01/11/2023   CL 114 (H) 01/11/2023   CREATININE 1.60 (H) 01/11/2023   BUN 34 (H) 01/11/2023   CO2 19 01/11/2023   TSH 2.07 12/28/2020   PSA 0.70 12/18/2016   INR 1.2 03/26/2019   HGBA1C 5.7 01/11/2023   MICROALBUR 7.0 (H) 01/11/2023     Assessment & Plan:    See Problem List for Assessment and Plan of chronic medical problems.

## 2023-02-09 ENCOUNTER — Ambulatory Visit: Payer: Medicare PPO | Admitting: Sports Medicine

## 2023-02-09 ENCOUNTER — Ambulatory Visit: Payer: Medicare PPO | Admitting: Internal Medicine

## 2023-02-09 VITALS — BP 120/80 | HR 64 | Temp 98.2°F | Ht 68.0 in | Wt 221.0 lb

## 2023-02-09 VITALS — HR 74 | Ht 68.0 in | Wt 222.0 lb

## 2023-02-09 DIAGNOSIS — N401 Enlarged prostate with lower urinary tract symptoms: Secondary | ICD-10-CM | POA: Diagnosis not present

## 2023-02-09 DIAGNOSIS — M25511 Pain in right shoulder: Secondary | ICD-10-CM | POA: Diagnosis not present

## 2023-02-09 DIAGNOSIS — E782 Mixed hyperlipidemia: Secondary | ICD-10-CM | POA: Diagnosis not present

## 2023-02-09 DIAGNOSIS — I5032 Chronic diastolic (congestive) heart failure: Secondary | ICD-10-CM

## 2023-02-09 DIAGNOSIS — G8929 Other chronic pain: Secondary | ICD-10-CM | POA: Diagnosis not present

## 2023-02-09 DIAGNOSIS — I10 Essential (primary) hypertension: Secondary | ICD-10-CM

## 2023-02-09 DIAGNOSIS — E1129 Type 2 diabetes mellitus with other diabetic kidney complication: Secondary | ICD-10-CM | POA: Diagnosis not present

## 2023-02-09 DIAGNOSIS — M109 Gout, unspecified: Secondary | ICD-10-CM

## 2023-02-09 DIAGNOSIS — N529 Male erectile dysfunction, unspecified: Secondary | ICD-10-CM

## 2023-02-09 DIAGNOSIS — N1831 Chronic kidney disease, stage 3a: Secondary | ICD-10-CM | POA: Diagnosis not present

## 2023-02-09 DIAGNOSIS — M25512 Pain in left shoulder: Secondary | ICD-10-CM

## 2023-02-09 NOTE — Assessment & Plan Note (Signed)
Chronic Controlled Continue Flomax 0.4 mg daily

## 2023-02-09 NOTE — Assessment & Plan Note (Signed)
Chronic Following with nephrology Stable CBC, CMP

## 2023-02-09 NOTE — Assessment & Plan Note (Signed)
Chronic Sildenafil prn

## 2023-02-09 NOTE — Assessment & Plan Note (Addendum)
Chronic Euvolemic Continue furosemide 40 mg a few times a week, metoprolol 100 mg twice daily

## 2023-02-09 NOTE — Assessment & Plan Note (Signed)
Chronic Blood pressure well controlled GFR stable Continue hydralazine 50 mg twice daily, metoprolol 100 mg twice daily

## 2023-02-09 NOTE — Assessment & Plan Note (Signed)
Chronic °Regular exercise and healthy diet encouraged °Check lipid panel  °Continue atorvastatin 20 mg daily °

## 2023-02-09 NOTE — Assessment & Plan Note (Signed)
Chronic Controlled Continue allopurinol 300 mg daily 

## 2023-02-09 NOTE — Assessment & Plan Note (Signed)
Chronic   Lab Results  Component Value Date   HGBA1C 5.7 01/11/2023   Sugars well controlled Check A1c , urine microalb Management per endocrine-on glimepiride 1 mg daily, Rybelsus 14 mg daily Stressed regular exercise, diabetic diet

## 2023-02-12 ENCOUNTER — Telehealth: Payer: Self-pay | Admitting: Physician Assistant

## 2023-02-12 MED ORDER — AMOXICILLIN-POT CLAVULANATE 875-125 MG PO TABS: 1.0000 | ORAL_TABLET | Freq: Two times a day (BID) | ORAL | 0 refills | Status: DC

## 2023-02-12 NOTE — Telephone Encounter (Signed)
Pt states he started having pain in his LLQ of his abd Saturday. The pain is below his belly button, he has not noticed any fever. He took linzess Saturday and has several loose stools. Pt has an OV scheduled but not until 9/27. He is concerned he may be having a diverticulitis flare. Please advise.

## 2023-02-12 NOTE — Telephone Encounter (Signed)
Left message for pt on his identified voicemail that the Augmentin prescription has been sent to his pharmacy. He is aware of recommendations of going to the ER if worsens.

## 2023-02-12 NOTE — Telephone Encounter (Signed)
Has history of diverticulitis, if similar to previous diverticulitis will send in Augmentin -IBGARD daily, heating pad and liquid diet. Advised to go to the ER if there is any severe abdominal pain, unable to hold down food/water, blood in stool or vomit, chest pain, shortness of breath, or any worsening symptoms.   -close follow up

## 2023-02-12 NOTE — Telephone Encounter (Signed)
Inbound call from patient stating he is experiencing extreme discomfort in lower abdomen. Patient is scheduled for 9/27 but states he is extremely concerned. Patient requesting a call back to be advised on option to help before then. Please advise, thank you.

## 2023-02-22 ENCOUNTER — Other Ambulatory Visit: Payer: Self-pay | Admitting: Internal Medicine

## 2023-02-26 ENCOUNTER — Encounter: Payer: Self-pay | Admitting: Cardiovascular Disease

## 2023-02-26 ENCOUNTER — Ambulatory Visit: Payer: Medicare PPO | Admitting: Cardiovascular Disease

## 2023-02-26 VITALS — BP 110/70 | HR 60 | Ht 67.5 in | Wt 222.0 lb

## 2023-02-26 DIAGNOSIS — I5032 Chronic diastolic (congestive) heart failure: Secondary | ICD-10-CM

## 2023-02-26 DIAGNOSIS — I10 Essential (primary) hypertension: Secondary | ICD-10-CM | POA: Diagnosis not present

## 2023-02-26 DIAGNOSIS — E782 Mixed hyperlipidemia: Secondary | ICD-10-CM

## 2023-02-26 DIAGNOSIS — I700 Atherosclerosis of aorta: Secondary | ICD-10-CM | POA: Diagnosis not present

## 2023-02-26 NOTE — Progress Notes (Signed)
Cardiology Office Note:    Date:  02/26/2023   ID:  Noah Barnes, DOB 26-Apr-1946, MRN [id number]  PCP:  Pincus Sanes, MD   [facility] Providers Cardiologist:  Tonny Bollman, MD     Referring MD: Pincus Sanes, MD   Chief Complaint  Patient presents with   Congestive Heart Failure    History of Present Illness:    Noah Barnes is a 77 y.o. male presenting for follow-up of chronic diastolic heart failure.  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  He has never had symptoms of angina.  He is here alone today. He follows with nephrology every 6 months and endocrinology every 4 months per his report. States that he's having some issues with diverticular disease and is scheduled to see gastroenterology in September. He feels like he's doing well from a cardiovascular perspective. Today, he denies symptoms of palpitations, chest pain, shortness of breath, orthopnea, PND, lower extremity edema, dizziness, or syncope.  Past Medical History:  Diagnosis Date   CHF (congestive heart failure) (HCC)    Chronic diastolic heart failure (HCC)    Constipation    Diabetes mellitus    Diverticulitis    Diverticulosis    ED (erectile dysfunction)    Gout    Heart murmur    Hyperlipidemia    Hypertension    IBS (irritable bowel syndrome)    Renal calculus    Renal insufficiency    Tubular adenoma of colon     Past Surgical History:  Procedure Laterality Date   COLONOSCOPY  1998   negative; no F/U   EXTRACORPOREAL SHOCK WAVE LITHOTRIPSY Left 03/20/2019   Procedure: EXTRACORPOREAL SHOCK WAVE LITHOTRIPSY (ESWL);  Surgeon: Malen Gauze, MD;  Location: [facility];  Service: Urology;  Laterality: Left;   knee effusion tapped       Current Medications: Current Meds  Medication Sig   ACCU-CHEK GUIDE test strip USE AS DIRECTED TO TEST BLOOD SUGAR TWICE DAILY   Accu-Chek Softclix Lancets lancets CHECK BLOOD SUGAR TWICE A DAY.   albuterol (PROVENTIL) (2.5 MG/3ML) 0.083% nebulizer solution Take 3 mLs (2.5 mg total) by nebulization every 6 (six) hours as needed for wheezing or shortness of breath.   albuterol (VENTOLIN HFA) 108 (90 Base) MCG/ACT inhaler Inhale 2 puffs into the lungs every 6 (six) hours as needed for wheezing or shortness of breath.   allopurinol (ZYLOPRIM) 300 MG tablet Take 1 tablet by mouth once daily   amoxicillin-clavulanate (AUGMENTIN) 875-125 MG tablet Take 1 tablet by mouth 2 (two) times daily.   atorvastatin (LIPITOR) 20 MG tablet Take 1 tablet by mouth once daily   Blood Glucose Monitoring Suppl (ACCU-CHEK GUIDE ME) w/Device KIT 1 each by Does not apply route in the morning and at bedtime. Use Accu Chek Guide me device to check blood sugar twice daily.   ELIQUIS 2.5 MG TABS tablet Take 1 tablet by mouth twice daily   FERREX 150 150 MG capsule Take 1 capsule by mouth once daily   fluticasone-salmeterol (ADVAIR DISKUS) 250-50 MCG/ACT AEPB Inhale 1 puff into the lungs in the morning and at bedtime.   furosemide (LASIX) 40 MG tablet Take 40 mg by mouth daily.   glimepiride (AMARYL) 1 MG tablet Take  1 tablet (1 mg total) by mouth daily with breakfast.   hydrALAZINE (APRESOLINE) 50 MG tablet Take 1 tablet (50 mg total) by mouth 2 (two) times daily. Annual appt due in Aug must see provider for future refills   linaclotide (LINZESS) 290 MCG CAPS capsule Take 1 capsule (290 mcg total) by mouth daily before breakfast.   metoprolol tartrate (LOPRESSOR) 100 MG tablet Take 1 tablet by mouth twice daily   Semaglutide (RYBELSUS) 14 MG TABS TAKE 1 TABLET BY MOUTH IN THE MORNING 30 MINUTES BEFORE BREAKFAST WITH SIP OF WATER   sildenafil (VIAGRA) 100 MG tablet TAKE 1 TABLET BY MOUTH EVERY DAY AS NEEDED FOR ERECTILE  DYSFUNCTION   tamsulosin (FLOMAX) 0.4 MG CAPS capsule TAKE 1 CAPSULE BY MOUTH ONCE DAILY AFTER SUPPER   Tiotropium Bromide Monohydrate (SPIRIVA RESPIMAT) 2.5 MCG/ACT AERS Inhale 2 puffs into the lungs daily.   Vitamin D, Ergocalciferol, (DRISDOL) 1.25 MG (50000 UNIT) CAPS capsule Take 50,000 Units by mouth once a week.     Allergies:   Ace inhibitors and Angiotensin receptor blockers   Social History   Socioeconomic History   Marital status: Married    Spouse name: Not on file   Number of children: 1   Years of education: Not on file   Highest education level: Not on file  Occupational History   Occupation: Psychologist, counselling: [employer]    Comment: [phone]  Tobacco Use   Smoking status: Former    Current packs/day: 0.00    Average packs/day: 0.3 packs/day for 23.0 years (6.9 ttl pk-yrs)    Types: Cigarettes    Start date: 07/03/1968    Quit date: 07/04/1991    Years since quitting: 31.6   Smokeless tobacco: Never   Tobacco comments:    smoked age [age], up to 1/3 ppd; may be less  Vaping Use   Vaping status: Never Used  Substance and Sexual Activity   Alcohol use: Not Currently    Comment: rarely   Drug use: No   Sexual activity: Yes  Other Topics Concern   Not on file  Social History Narrative   ** Merged History Encounter **       Social Determinants of Health   Financial Resource Strain: Low Risk  (12/26/2022)   Overall Financial Resource Strain (CARDIA)    Difficulty of Paying Living Expenses: Not hard at all  Food Insecurity: No Food Insecurity (12/26/2022)   Hunger Vital Sign    Worried About Running Out of Food in the Last Year: Never true    Ran Out of Food in the Last Year: Never true  Transportation Needs: No Transportation Needs (12/26/2022)   PRAPARE - Administrator, Civil Service (Medical): No    Lack of Transportation (Non-Medical): No  Physical Activity: Insufficiently Active (12/26/2022)   Exercise Vital Sign     Days of Exercise per Week: 3 days    Minutes of Exercise per Session: 20 min  Stress: No Stress Concern Present (12/26/2022)   [employer] of Occupational Health - Occupational Stress Questionnaire    Feeling of Stress : Not at all  Social Connections: Socially Integrated (12/26/2022)   Social Connection and Isolation Panel [NHANES]    Frequency of Communication with Friends and Family: More than three times a week    Frequency of Social Gatherings with Friends and Family: More than three times a week    Attends Religious Services: More than 4  times per year    Active Member of Clubs or Organizations: Yes    Attends Engineer, structural: More than 4 times per year    Marital Status: Married     Family History: The patient's family history includes Colon cancer in his brother; Diabetes in his father; Heart attack (age of onset: 56) in his mother; Heart disease in his brother; Hypertension in his mother; Irritable bowel syndrome in his brother, father, and sister; Prostate cancer (age of onset: 57) in his father. There is no history of Stroke, Esophageal cancer, Rectal cancer, or Stomach cancer.  ROS:   Please see the history of present illness.    All other systems reviewed and are negative.  EKGs/Labs/Other Studies Reviewed:    The following studies were reviewed today: Echocardiogram 03/26/2019: 1. Left ventricular ejection fraction, by visual estimation, is 60 to  65%. The left ventricle has normal function. Normal left ventricular size.  There is mildly increased left ventricular hypertrophy.   2. Left ventricular diastolic Doppler parameters are consistent with  impaired relaxation pattern of LV diastolic filling.   3. Global right ventricle has normal systolic function.The right  ventricular size is normal. No increase in right ventricular wall  thickness.   4. Left atrial size was normal.   5. Right atrial size was normal.   6. The mitral valve is normal in  structure. No evidence of mitral valve  regurgitation. No evidence of mitral stenosis.   7. The tricuspid valve is normal in structure. Tricuspid valve  regurgitation is mild.   8. The aortic valve is tricuspid Aortic valve regurgitation is trivial by  color flow Doppler. Mild aortic valve sclerosis without stenosis.   9. The pulmonic valve was normal in structure. Pulmonic valve  regurgitation is trivial by color flow Doppler.  10. Severely elevated pulmonary artery systolic pressure.  11. The inferior vena cava is normal in size with greater than 50%  respiratory variability, suggesting right atrial pressure of 3 mmHg.   EKG Interpretation Date/Time:  Monday February 26 2023 08:52:19 EDT Ventricular Rate:  65 PR Interval:  184 QRS Duration:  84 QT Interval:  364 QTC Calculation: 378 R Axis:   -7  Text Interpretation: Normal sinus rhythm Minimal voltage criteria for LVH, may be normal variant ( R in aVL ) Possible Inferior MI (old or age indeterminate) When compared with ECG of 24-Mar-2019 16:24, PREVIOUS ECG IS PRESENT No significant change was found Confirmed by Tonny Bollman [phone]) on 02/26/2023 8:55:09 AM    Recent Labs: 09/26/2022: ALT 48; Hemoglobin 13.2; Platelet Count 188 01/11/2023: BUN 34; Creatinine, Ser 1.60; Potassium 4.0; Sodium 140  Recent Lipid Panel    Component Value Date/Time   CHOL 142 08/11/2022 0845   TRIG 66.0 08/11/2022 0845   HDL 50.40 08/11/2022 0845   CHOLHDL 3 08/11/2022 0845   VLDL 13.2 08/11/2022 0845   LDLCALC 78 08/11/2022 0845   LDLDIRECT 80.1 04/15/2014 0953     Risk Assessment/Calculations:                Physical Exam:    VS:  BP 110/70   Pulse 60   Ht 5' 7.5" (1.715 m)   Wt 222 lb (100.7 kg)   SpO2 96%   BMI 34.26 kg/m     Wt Readings from Last 3 Encounters:  02/26/23 222 lb (100.7 kg)  02/09/23 221 lb (100.2 kg)  02/09/23 222 lb (100.7 kg)     GEN:  Well nourished, well  developed in no acute distress HEENT:  Normal NECK: No JVD; No carotid bruits LYMPHATICS: No lymphadenopathy CARDIAC: RRR, no murmurs, rubs, gallops RESPIRATORY:  Clear to auscultation without rales, wheezing or rhonchi  ABDOMEN: Soft, non-tender, non-distended MUSCULOSKELETAL:  Trace bilateral pretibial edema; No deformity  SKIN: Warm and dry NEUROLOGIC:  Alert and oriented x 3 PSYCHIATRIC:  Normal affect   ASSESSMENT:    1. Mixed hyperlipidemia   2. Aortic atherosclerosis (HCC)   3. Chronic diastolic heart failure (HCC)   4. Essential hypertension    PLAN:    In order of problems listed above:  Treated with atorvastatin 20 mg daily.  Last lipids reviewed with cholesterol 142, HDL 50, LDL 78.  Lifestyle modification reviewed.  Continue current medical therapy. Treated with a statin drug.  On apixaban for anticoagulation. Appears to have NYHA functional class I symptoms.  His blood pressure is under ideal control.  Volume status is euvolemic.  No congestive symptoms.  Continue current management.  Patient treated with a loop diuretic. Blood pressure under ideal control on metoprolol, furosemide, and hydralazine.  Overall the patient appears clinically stable.  His renal function is stable and he is followed regularly by nephrology.  His diabetes is under ideal control under the management of endocrinology.  From a cardiac perspective he demonstrates no progressive symptoms and his risk factors are well-managed.  I will see him back in 1 year for follow-up evaluation.           Medication Adjustments/Labs and Tests Ordered: Current medicines are reviewed at length with the patient today.  Concerns regarding medicines are outlined above.  Orders Placed This Encounter  Procedures   EKG 12-Lead   No orders of the defined types were placed in this encounter.   Patient Instructions  Medication Instructions:  Your physician recommends that you continue on your current medications as directed. Please refer to the  Current Medication list given to you today.  *If you need a refill on your cardiac medications before your next appointment, please call your pharmacy*   Lab Work: NONE If you have labs (blood work) drawn today and your tests are completely normal, you will receive your results only by: MyChart Message (if you have MyChart) OR A paper copy in the mail If you have any lab test that is abnormal or we need to change your treatment, we will call you to review the results.   Testing/Procedures: NONE   Follow-Up: At [facility], you and your health needs are our priority.  As part of our continuing mission to provide you with exceptional heart care, we have created designated Provider Care Teams.  These Care Teams include your primary Cardiologist (physician) and Advanced Practice Providers (APPs -  Physician Assistants and Nurse Practitioners) who all work together to provide you with the care you need, when you need it.  We recommend signing up for the patient portal called "MyChart".  Sign up information is provided on this After Visit Summary.  MyChart is used to connect with patients for Virtual Visits (Telemedicine).  Patients are able to view lab/test results, encounter notes, upcoming appointments, etc.  Non-urgent messages can be sent to your provider as well.   To learn more about what you can do with MyChart, go to [email or web address].    Your next appointment:   1 year(s)  Provider:   Tonny Bollman, MD        Signed, Tonny Bollman, MD  02/26/2023 11:35 AM  [facility]

## 2023-02-26 NOTE — Patient Instructions (Signed)

## 2023-02-27 ENCOUNTER — Other Ambulatory Visit: Payer: Self-pay | Admitting: Internal Medicine

## 2023-03-15 ENCOUNTER — Other Ambulatory Visit: Payer: Self-pay | Admitting: Hematology & Oncology

## 2023-03-15 DIAGNOSIS — Z86718 Personal history of other venous thrombosis and embolism: Secondary | ICD-10-CM

## 2023-03-15 DIAGNOSIS — Z86711 Personal history of pulmonary embolism: Secondary | ICD-10-CM

## 2023-03-19 ENCOUNTER — Ambulatory Visit (INDEPENDENT_AMBULATORY_CARE_PROVIDER_SITE_OTHER): Payer: Medicare PPO

## 2023-03-19 DIAGNOSIS — Z23 Encounter for immunization: Secondary | ICD-10-CM

## 2023-03-19 NOTE — Progress Notes (Signed)
HD flu vaccine was administered today in the patients right deltoid. Patient tolerated the injection well and the injection site look fine. Advised patient to report to the office immediately if he noticed any adverse reactions.

## 2023-03-26 ENCOUNTER — Other Ambulatory Visit (HOSPITAL_BASED_OUTPATIENT_CLINIC_OR_DEPARTMENT_OTHER): Payer: Self-pay

## 2023-03-26 MED ORDER — COVID-19 MRNA VAC-TRIS(PFIZER) 30 MCG/0.3ML IM SUSY
0.3000 mL | PREFILLED_SYRINGE | Freq: Once | INTRAMUSCULAR | 0 refills | Status: AC
  Filled 2023-03-26: qty 0.3, 1d supply, fill #0

## 2023-03-28 ENCOUNTER — Ambulatory Visit: Payer: Medicare PPO | Admitting: Family

## 2023-03-28 ENCOUNTER — Inpatient Hospital Stay: Payer: Medicare PPO

## 2023-03-29 NOTE — Progress Notes (Signed)
.     03/30/2023 [name] [id number] [birth year]  Referring provider: Pincus Sanes, MD Primary GI doctor: Dr. Rhea Belton  ASSESSMENT AND PLAN:   Chronic Constipation Improved with Linzess 290mg  daily. Reports occasional diarrhea every 2 weeks, self-manages by skipping a dose. -Continue Linzess 290mg  daily. -Consider reducing dose to Linzess 145mg  daily if diarrhea becomes more frequent. - add on fiber  Diverticulosis History of diverticulitis treated with antibiotics a couple of months ago. Currently asymptomatic. -Start fiber supplement (Benefiber or Citrucel recommended) to help prevent future diverticulitis episodes and improve stool consistency.  Atrial Fibrillation Irregular rhythm noted on exam, but rate controlled. - on eliquis, continue to follow up with cardiology.   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 Currently asymptomatic. -No intervention needed at this time.  Follow-up 6 months      Patient Care Team: Pincus Sanes, MD as PCP - General (Internal Medicine) Tonny Bollman, MD as PCP - Cardiology (Cardiology) Reather Littler, MD (Inactive) as Consulting Physician (Endocrinology) Norva Pavlov, OD as Consulting Physician (Optometry) Pyrtle, Carie Caddy, MD as Consulting Physician (Gastroenterology)  HISTORY OF PRESENT ILLNESS: 77 y.o. male with a past medical history of chronic diastolic heart failure, mitral regurgitation, aortic atherosclerosis, chronic bronchitis, type 2 diabetes with CKD, IBS, personal history of tubular adenomatous polyps, diverticulosis history of PE and DVT on Eliquis and others listed below presents for evaluation of constipation with AB pain.   02/2020 Colonoscopy polyp surveillance plus family history of colon cancer- moderate tics, internal hemorrhoids, otherwise normal. 06/07/2021 [facility] for constipation on linzess and periodic bowel purges and recent diverticulitis resolved with cipro/flagyl.   05/29/2022 office visit with myself with worsening constipation thought possibly due to Rybelsus, trial of Motegrity with or without Linzess or add MiraLAX daily to Linzess.  Discussed the use of AI scribe software for clinical note transcription with the patient, who gave verbal consent to proceed.  The patient, with a history of diverticulosis, presents for a follow-up visit for constipation. They have been on Linzess 290mg , the highest dose, for almost a year. They report that their bowel movements are generally okay, but they experience diarrhea every two weeks. When this occurs, they skip a day of Linzess, which seems to regulate their bowel movements. They are also on Rybelsus for diabetes  The patient has a history of diverticulosis, diagnosed in 2021. They recall having an episode of inflammation August of this year, which was treated with Augmentin. They deny having any symptoms of diverticulitis such as diarrhea, constipation, nausea, vomiting, fevers, chills, or abdominal discomfort.  They do report occasional abdominal discomfort, particularly in the morning if they do not eat. The discomfort is around the belly button or lower and improves after a bowel movement. The discomfort has improved since starting Linzess.     He  reports that he quit smoking about 31 years ago. His smoking use included cigarettes. He started smoking about 54 years ago. He has a 6.9 pack-year smoking history. He has never used smokeless tobacco. He reports that he does not currently use alcohol. He reports that he does not use drugs.  RELEVANT LABS AND IMAGING:  Results   DIAGNOSTIC Colonoscopy: Diverticulosis (2021)      CBC    Component Value Date/Time   WBC 3.8 (L) 09/26/2022 0943   WBC 4.2 08/11/2022 0845   RBC 4.89 09/26/2022 0943   HGB 13.2 09/26/2022 0943   HCT 41.2 09/26/2022 0943   PLT 188 09/26/2022  0943   MCV 84.3 09/26/2022 0943   MCH 27.0 09/26/2022 0943   MCHC 32.0 09/26/2022 0943    RDW 14.1 09/26/2022 0943   LYMPHSABS 1.0 09/26/2022 0943   MONOABS 0.4 09/26/2022 0943   EOSABS 0.1 09/26/2022 0943   BASOSABS 0.0 09/26/2022 0943   Recent Labs    05/29/22 0934 08/11/22 0845 09/26/22 0943  HGB 13.1 13.8 13.2    CMP     Component Value Date/Time   NA 140 01/11/2023 0900   K 4.0 01/11/2023 0900   CL 114 (H) 01/11/2023 0900   CO2 19 01/11/2023 0900   GLUCOSE 107 (H) 01/11/2023 0900   BUN 34 (H) 01/11/2023 0900   CREATININE 1.60 (H) 01/11/2023 0900   CREATININE 1.69 (H) 09/26/2022 0943   CALCIUM 9.2 01/11/2023 0900   PROT 6.3 (L) 09/26/2022 0943   ALBUMIN 4.1 09/26/2022 0943   AST 21 09/26/2022 0943   ALT 48 (H) 09/26/2022 0943   ALKPHOS 75 09/26/2022 0943   BILITOT 0.7 09/26/2022 0943   GFRNONAA 42 (L) 09/26/2022 0943   GFRAA 43 (L) 03/25/2020 1026      Latest Ref Rng & Units 09/26/2022    9:43 AM 08/11/2022    8:45 AM 05/29/2022    9:34 AM  Hepatic Function  Total Protein 6.5 - 8.1 g/dL 6.3  6.8  6.8   Albumin 3.5 - 5.0 g/dL 4.1  4.1  3.9   AST 15 - 41 U/L 21  21  20    ALT 0 - 44 U/L 48  53  39   Alk Phosphatase 38 - 126 U/L 75  79  65   Total Bilirubin 0.3 - 1.2 mg/dL 0.7  0.6  0.5       Current Medications:   Current Outpatient Medications (Endocrine & Metabolic):    glimepiride (AMARYL) 1 MG tablet, Take 1 tablet (1 mg total) by mouth daily with breakfast.   Semaglutide (RYBELSUS) 14 MG TABS, TAKE 1 TABLET BY MOUTH IN THE MORNING 30 MINUTES BEFORE BREAKFAST WITH SIP OF WATER  Current Outpatient Medications (Cardiovascular):    atorvastatin (LIPITOR) 20 MG tablet, Take 1 tablet by mouth once daily   furosemide (LASIX) 40 MG tablet, Take 40 mg by mouth daily.   hydrALAZINE (APRESOLINE) 50 MG tablet, Take 1 tablet (50 mg total) by mouth 2 (two) times daily. Annual appt due in Aug must see provider for future refills   metoprolol tartrate (LOPRESSOR) 100 MG tablet, Take 1 tablet by mouth twice daily   sildenafil (VIAGRA) 100 MG tablet, TAKE 1  TABLET BY MOUTH EVERY DAY AS NEEDED FOR ERECTILE DYSFUNCTION  Current Outpatient Medications (Respiratory):    albuterol (PROVENTIL) (2.5 MG/3ML) 0.083% nebulizer solution, Take 3 mLs (2.5 mg total) by nebulization every 6 (six) hours as needed for wheezing or shortness of breath.   albuterol (VENTOLIN HFA) 108 (90 Base) MCG/ACT inhaler, Inhale 2 puffs into the lungs every 6 (six) hours as needed for wheezing or shortness of breath.   fluticasone-salmeterol (ADVAIR DISKUS) 250-50 MCG/ACT AEPB, Inhale 1 puff into the lungs in the morning and at bedtime.   Tiotropium Bromide Monohydrate (SPIRIVA RESPIMAT) 2.5 MCG/ACT AERS, Inhale 2 puffs into the lungs daily.  Current Outpatient Medications (Analgesics):    allopurinol (ZYLOPRIM) 300 MG tablet, Take 1 tablet by mouth once daily  Current Outpatient Medications (Hematological):    ELIQUIS 2.5 MG TABS tablet, Take 1 tablet by mouth twice daily   FERREX 150 150 MG capsule, Take 1  capsule by mouth once daily  Current Outpatient Medications (Other):    ACCU-CHEK GUIDE test strip, USE AS DIRECTED TO TEST BLOOD SUGAR TWICE DAILY   Accu-Chek Softclix Lancets lancets, CHECK BLOOD SUGAR TWICE A DAY.   Blood Glucose Monitoring Suppl (ACCU-CHEK GUIDE ME) w/Device KIT, 1 each by Does not apply route in the morning and at bedtime. Use Accu Chek Guide me device to check blood sugar twice daily.   linaclotide (LINZESS) 290 MCG CAPS capsule, Take 1 capsule (290 mcg total) by mouth daily before breakfast.   tamsulosin (FLOMAX) 0.4 MG CAPS capsule, TAKE 1 CAPSULE BY MOUTH ONCE DAILY AFTER SUPPER   Vitamin D, Ergocalciferol, (DRISDOL) 1.25 MG (50000 UNIT) CAPS capsule, Take 50,000 Units by mouth once a week.  Medical History:  Past Medical History:  Diagnosis Date   CHF (congestive heart failure) (HCC)    Chronic diastolic heart failure (HCC)    Constipation    Diabetes mellitus    Diverticulitis    Diverticulosis    ED (erectile dysfunction)    Gout     Heart murmur    Hyperlipidemia    Hypertension    IBS (irritable bowel syndrome)    Renal calculus    Renal insufficiency    Tubular adenoma of colon      Allergies:  Allergies  Allergen Reactions   Ace Inhibitors Other (See Comments)    Severe AKI due to ARB + dehydration Aug 2016   Angiotensin Receptor Blockers Other (See Comments)    Severe AKI due to ARB + dehydration Aug 2016     Surgical History:  He  has a past surgical history that includes Colonoscopy (1998); knee effusion tapped; and Extracorporeal shock wave lithotripsy (Left, 03/20/2019). Family History:  His family history includes Colon cancer in his brother; Diabetes in his father; Heart attack (age of onset: 53) in his mother; Heart disease in his brother; Hypertension in his mother; Irritable bowel syndrome in his brother, father, and sister; Prostate cancer (age of onset: 56) in his father.  REVIEW OF SYSTEMS  : All other systems reviewed and negative except where noted in the History of Present Illness.  PHYSICAL EXAM: BP 110/64 (BP Location: Left Arm, Patient Position: Sitting, Cuff Size: Normal)   Pulse (!) 58   Ht 5' 7.5" (1.715 m)   Wt 224 lb 6 oz (101.8 kg)   BMI 34.62 kg/m  General Appearance: Well nourished, in no apparent distress. Head:   Normocephalic and atraumatic. CHEST: Lungs clear to auscultation. CARDIOVASCULAR: Irregular rhythm noted. ABDOMEN: Non-tender, ventral hernia present. EXTREMITIES: 1+ edema in legs.      Doree Albee, PA-C 9:22 AM

## 2023-03-30 ENCOUNTER — Other Ambulatory Visit: Payer: Self-pay

## 2023-03-30 ENCOUNTER — Encounter: Payer: Self-pay | Admitting: Physician Assistant

## 2023-03-30 ENCOUNTER — Ambulatory Visit: Payer: Medicare PPO | Admitting: Physician Assistant

## 2023-03-30 VITALS — BP 110/64 | HR 58 | Ht 67.5 in | Wt 224.4 lb

## 2023-03-30 DIAGNOSIS — I5032 Chronic diastolic (congestive) heart failure: Secondary | ICD-10-CM | POA: Diagnosis not present

## 2023-03-30 DIAGNOSIS — Z86010 Personal history of colonic polyps: Secondary | ICD-10-CM

## 2023-03-30 DIAGNOSIS — Z8719 Personal history of other diseases of the digestive system: Secondary | ICD-10-CM

## 2023-03-30 DIAGNOSIS — K5909 Other constipation: Secondary | ICD-10-CM

## 2023-03-30 DIAGNOSIS — E1129 Type 2 diabetes mellitus with other diabetic kidney complication: Secondary | ICD-10-CM | POA: Diagnosis not present

## 2023-03-30 MED ORDER — GLIMEPIRIDE 1 MG PO TABS: 1.0000 mg | ORAL_TABLET | Freq: Every day | ORAL | 0 refills | Status: DC

## 2023-03-30 NOTE — Patient Instructions (Addendum)
We will be giving you a call to schedule you for your 6 month follow up.   Diverticulosis Diverticulosis is a condition that develops when small pouches (diverticula) form in the wall of the large intestine (colon). The colon is where water is absorbed and stool (feces) is formed. The pouches form when the inside layer of the colon pushes through weak spots in the outer layers of the colon. You may have a few pouches or many of them. The pouches usually do not cause problems unless they become inflamed or infected. When this happens, the condition is called diverticulitis- this is left lower quadrant pain, diarrhea, fever, chills, nausea or vomiting.  If this occurs please call the office or go to the hospital. Sometimes these patches without inflammation can also have painless bleeding associated with them, if this happens please call the office or go to the hospital. Preventing constipation and increasing fiber can help reduce diverticula and prevent complications. Even if you feel you have a high-fiber diet, suggest getting on Benefiber or Cirtracel 2 times daily.  If you start to have symptoms do these things and call our office.  Can take dicyclomine at least 1-2 x a day for pain if needed.   Can do heating pad and can take tylenol max of 3000mg  a day.  Can add on lidocaine patches or voltern gel Go to the ER if unable to pass gas, severe AB pain, unable to hold down food, any shortness of breath of chest pain.   Diverticulitis Diverticulitis is inflammation or infection of small pouches in your colon that form when you have a condition called diverticulosis. The pouches in your colon are called diverticula. Your colon, or large intestine, is where water is absorbed and stool is formed. Complications of diverticulitis can include: Bleeding. Severe infection. Severe pain. Perforation of your colon. Obstruction of your colon.  What are the causes? Diverticulitis is caused by  bacteria. Diverticulitis happens when stool becomes trapped in diverticula. This allows bacteria to grow in the diverticula, which can lead to inflammation and infection. What increases the risk? People with diverticulosis are at risk for diverticulitis. Eating a diet that does not include enough fiber from fruits and vegetables may make diverticulitis more likely to develop. What are the signs or symptoms? Symptoms of diverticulitis may include: Abdominal pain and tenderness. The pain is normally located on the left side of the abdomen, but may occur in other areas. Fever and chills. Bloating. Cramping. Nausea. Vomiting. Constipation. Diarrhea. Blood in your stool.  How is this diagnosed? Your health care provider will ask you about your medical history and do a physical exam. You may need to have tests done because many medical conditions can cause the same symptoms as diverticulitis. Tests may include: Blood tests. Urine tests. Imaging tests of the abdomen, including X-rays and CT scans.  When your condition is under control, your health care provider may recommend that you have a colonoscopy. A colonoscopy can show how severe your diverticula are and whether something else is causing your symptoms. How is this treated? Most cases of diverticulitis are mild and can be treated at home. Treatment may include: Taking over-the-counter pain medicines. Following a clear liquid diet. Taking antibiotic medicines by mouth for 7-10 days.  More severe cases may be treated at a hospital. Treatment may include: Not eating or drinking. Taking prescription pain medicine. Receiving antibiotic medicines through an IV tube. Receiving fluids and nutrition through an IV tube. Surgery.  Follow these  instructions at home: Follow your health care provider's instructions carefully. Follow a full liquid diet or other diet as directed by your health care provider. After your symptoms improve, your  health care provider may tell you to change your diet. He or she may recommend you eat a high-fiber diet. Fruits and vegetables are good sources of fiber. Fiber makes it easier to pass stool. Take fiber supplements or probiotics as directed by your health care provider. Only take medicines as directed by your health care provider. Keep all your follow-up appointments. Contact a health care provider if: Your pain does not improve. You have a hard time eating food. Your bowel movements do not return to normal. Get help right away if: Your pain becomes worse. Your symptoms do not get better. Your symptoms suddenly get worse. You have a fever. You have repeated vomiting. You have bloody or black, tarry stools. This information is not intended to replace advice given to you by your health care provider. Make sure you discuss any questions you have with your health care provider. Document Released: 03/29/2005 Document Revised: 11/25/2015 Document Reviewed: 05/14/2013 Elsevier Interactive Patient Education  2017 [employer].

## 2023-04-03 ENCOUNTER — Other Ambulatory Visit: Payer: Self-pay | Admitting: Internal Medicine

## 2023-04-04 ENCOUNTER — Inpatient Hospital Stay: Payer: Medicare PPO | Attending: Family

## 2023-04-04 ENCOUNTER — Inpatient Hospital Stay: Payer: Medicare PPO | Admitting: Medical Oncology

## 2023-04-04 ENCOUNTER — Encounter: Payer: Self-pay | Admitting: Medical Oncology

## 2023-04-04 ENCOUNTER — Other Ambulatory Visit: Payer: Self-pay

## 2023-04-04 VITALS — BP 143/66 | HR 65 | Temp 98.7°F | Resp 18 | Ht 67.5 in | Wt 222.0 lb

## 2023-04-04 DIAGNOSIS — Z86718 Personal history of other venous thrombosis and embolism: Secondary | ICD-10-CM | POA: Insufficient documentation

## 2023-04-04 DIAGNOSIS — Z7901 Long term (current) use of anticoagulants: Secondary | ICD-10-CM | POA: Insufficient documentation

## 2023-04-04 DIAGNOSIS — Z86711 Personal history of pulmonary embolism: Secondary | ICD-10-CM

## 2023-04-04 LAB — CBC WITH DIFFERENTIAL (CANCER CENTER ONLY)
Abs Immature Granulocytes: 0.02 10*3/uL (ref 0.00–0.07)
Basophils Absolute: 0 10*3/uL (ref 0.0–0.1)
Basophils Relative: 1 %
Eosinophils Absolute: 0.2 10*3/uL (ref 0.0–0.5)
Eosinophils Relative: 4 %
HCT: 42.8 % (ref 39.0–52.0)
Hemoglobin: 13.7 g/dL (ref 13.0–17.0)
Immature Granulocytes: 0 %
Lymphocytes Relative: 28 %
Lymphs Abs: 1.3 10*3/uL (ref 0.7–4.0)
MCH: 27 pg (ref 26.0–34.0)
MCHC: 32 g/dL (ref 30.0–36.0)
MCV: 84.3 fL (ref 80.0–100.0)
Monocytes Absolute: 0.4 10*3/uL (ref 0.1–1.0)
Monocytes Relative: 10 %
Neutro Abs: 2.6 10*3/uL (ref 1.7–7.7)
Neutrophils Relative %: 57 %
Platelet Count: 204 10*3/uL (ref 150–400)
RBC: 5.08 MIL/uL (ref 4.22–5.81)
RDW: 14 % (ref 11.5–15.5)
WBC Count: 4.6 10*3/uL (ref 4.0–10.5)
nRBC: 0 % (ref 0.0–0.2)

## 2023-04-04 LAB — CMP (CANCER CENTER ONLY)
ALT: 45 U/L — ABNORMAL HIGH (ref 0–44)
AST: 18 U/L (ref 15–41)
Albumin: 3.8 g/dL (ref 3.5–5.0)
Alkaline Phosphatase: 82 U/L (ref 38–126)
Anion gap: 9 (ref 5–15)
BUN: 31 mg/dL — ABNORMAL HIGH (ref 8–23)
CO2: 21 mmol/L — ABNORMAL LOW (ref 22–32)
Calcium: 9.3 mg/dL (ref 8.9–10.3)
Chloride: 109 mmol/L (ref 98–111)
Creatinine: 1.72 mg/dL — ABNORMAL HIGH (ref 0.61–1.24)
GFR, Estimated: 41 mL/min — ABNORMAL LOW (ref >60)
Glucose, Bld: 104 mg/dL — ABNORMAL HIGH (ref 70–99)
Potassium: 4.3 mmol/L (ref 3.5–5.1)
Sodium: 139 mmol/L (ref 135–145)
Total Bilirubin: 0.6 mg/dL (ref 0.3–1.2)
Total Protein: 6.7 g/dL (ref 6.5–8.1)

## 2023-04-04 NOTE — Progress Notes (Addendum)
Hematology and Oncology Follow Up Visit  Noah Barnes [id number] 27-Aug-1945 77 y.o. 04/04/2023   Principle Diagnosis:  Bilateral lower extremity DVT/pulmonary embolism - 03/23/2019   Current Therapy:        Eliquis 2.5 mg PO BID - maintenance lifelong   Interim History: [name] is here today for follow-up.   He states that he is doing well and has no concerns at this time.  He continues to tolerate Eliquis 2.5 mg PO BID nicely.  No blood loss, bruising or petechiae.  No fever, chills, n/v, cough, rash, dizziness, SOB, chest pain, palpitations, abdominal pain or changes in bowel or bladder habits.  No swelling, numbness or tingling in his extremities at this time. 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  No falls or syncope reported.  Appetite and hydration are good Wt Readings from Last 3 Encounters:  04/04/23 222 lb 0.6 oz (100.7 kg)  03/30/23 224 lb 6 oz (101.8 kg)  02/26/23 222 lb (100.7 kg)   ECOG Performance Status: 0 - Asymptomatic  Medications:  Allergies as of 04/04/2023       Reactions   Ace Inhibitors Other (See Comments)   Severe AKI due to ARB + dehydration Aug 2016   Angiotensin Receptor Blockers Other (See Comments)   Severe AKI due to ARB + dehydration Aug 2016        Medication List        Accurate as of April 04, 2023  9:58 AM. If you have any questions, ask your nurse or doctor.          Accu-Chek Guide Me w/Device Kit 1 each by Does not apply route in the morning and at bedtime. Use Accu Chek Guide me device to check blood sugar twice daily.   Accu-Chek Guide test strip Generic drug: glucose blood USE AS DIRECTED TO TEST BLOOD SUGAR TWICE DAILY   Accu-Chek Softclix Lancets lancets CHECK BLOOD SUGAR TWICE A DAY.   albuterol 108 (90 Base) MCG/ACT inhaler Commonly known as: VENTOLIN HFA Inhale 2 puffs into the lungs every 6 (six) hours as needed for wheezing or shortness of breath.   albuterol (2.5 MG/3ML) 0.083%  nebulizer solution Commonly known as: PROVENTIL Take 3 mLs (2.5 mg total) by nebulization every 6 (six) hours as needed for wheezing or shortness of breath.   allopurinol 300 MG tablet Commonly known as: ZYLOPRIM Take 1 tablet by mouth once daily   atorvastatin 20 MG tablet Commonly known as: LIPITOR Take 1 tablet by mouth once daily   Eliquis 2.5 MG Tabs tablet Generic drug: apixaban Take 1 tablet by mouth twice daily   Ferrex 150 150 MG capsule Generic drug: iron polysaccharides Take 1 capsule by mouth once daily   fluticasone-salmeterol 250-50 MCG/ACT Aepb Commonly known as: Advair Diskus Inhale 1 puff into the lungs in the morning and at bedtime.   furosemide 40 MG tablet Commonly known as: LASIX Take 40 mg by mouth daily.   glimepiride 1 MG tablet Commonly known as: AMARYL Take 1 tablet (1 mg total) by mouth daily with breakfast.   hydrALAZINE 50 MG tablet Commonly known as: APRESOLINE Take 1 tablet (50 mg total) by mouth 2 (two) times daily. Annual appt due in Aug must see provider for future refills   linaclotide 290 MCG Caps capsule Commonly known as: Linzess Take 1 capsule (290 mcg total) by mouth daily before breakfast.   metoprolol tartrate 100 MG tablet Commonly known as: LOPRESSOR Take 1  tablet by mouth twice daily   Rybelsus 14 MG Tabs Generic drug: Semaglutide TAKE 1 TABLET BY MOUTH IN THE MORNING 30 MINUTES BEFORE BREAKFAST WITH SIP OF WATER   sildenafil 100 MG tablet Commonly known as: VIAGRA TAKE 1 TABLET BY MOUTH EVERY DAY AS NEEDED FOR ERECTILE DYSFUNCTION   Spiriva Respimat 2.5 MCG/ACT Aers Generic drug: Tiotropium Bromide Monohydrate Inhale 2 puffs into the lungs daily.   tamsulosin 0.4 MG Caps capsule Commonly known as: FLOMAX TAKE 1 CAPSULE BY MOUTH ONCE DAILY AFTER SUPPER   Vitamin D (Ergocalciferol) 1.25 MG (50000 UNIT) Caps capsule Commonly known as: DRISDOL Take 50,000 Units by mouth once a week.        Allergies:   Allergies  Allergen Reactions   Ace Inhibitors Other (See Comments)    Severe AKI due to ARB + dehydration Aug 2016   Angiotensin Receptor Blockers Other (See Comments)    Severe AKI due to ARB + dehydration Aug 2016    Past Medical History, Surgical history, Social history, and Family History were reviewed and updated.  Review of Systems: All other 10 point review of systems is negative.   Physical Exam:  height is 5' 7.5" (1.715 m) and weight is 222 lb 0.6 oz (100.7 kg). His oral temperature is 98.7 F (37.1 C). His blood pressure is 143/66 (abnormal) and his pulse is 65. His respiration is 18 and oxygen saturation is 100%.   Wt Readings from Last 3 Encounters:  04/04/23 222 lb 0.6 oz (100.7 kg)  03/30/23 224 lb 6 oz (101.8 kg)  02/26/23 222 lb (100.7 kg)    Ocular: Sclerae unicteric, pupils equal, round and reactive to light Ear-nose-throat: Oropharynx clear, dentition fair Lymphatic: No cervical or supraclavicular adenopathy Lungs no rales or rhonchi, good excursion bilaterally Heart regular rate and rhythm, no murmur appreciated Abd soft, nontender, positive bowel sounds MSK no focal spinal tenderness, no joint edema Neuro: non-focal, well-oriented, appropriate affect  Lab Results  Component Value Date   WBC 4.6 04/04/2023   HGB 13.7 04/04/2023   HCT 42.8 04/04/2023   MCV 84.3 04/04/2023   PLT 204 04/04/2023   Lab Results  Component Value Date   FERRITIN 211 03/28/2019   IRON 29 (L) 03/28/2019   TIBC 216 (L) 03/28/2019   UIBC 187 03/28/2019   IRONPCTSAT 13 (L) 03/28/2019   Lab Results  Component Value Date   RETICCTPCT 1.2 03/28/2019   RBC 5.08 04/04/2023   No results found for: "KPAFRELGTCHN", "LAMBDASER", "KAPLAMBRATIO" No results found for: "IGGSERUM", "IGA", "IGMSERUM" No results found for: "TOTALPROTELP", "ALBUMINELP", "A1GS", "A2GS", "BETS", "BETA2SER", "GAMS", "MSPIKE", "SPEI"   Chemistry      Component Value Date/Time   NA 140 01/11/2023 0900    K 4.0 01/11/2023 0900   CL 114 (H) 01/11/2023 0900   CO2 19 01/11/2023 0900   BUN 34 (H) 01/11/2023 0900   CREATININE 1.60 (H) 01/11/2023 0900   CREATININE 1.69 (H) 09/26/2022 0943      Component Value Date/Time   CALCIUM 9.2 01/11/2023 0900   ALKPHOS 75 09/26/2022 0943   AST 21 09/26/2022 0943   ALT 48 (H) 09/26/2022 0943   BILITOT 0.7 09/26/2022 0943       Impression and Plan: Mr. LaGrange is a very pleasant 77 yo African American gentleman with 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 He completed 6 months of full anticoagulation and is now doing well on maintenance low dose Eliquis lifelong.   He continues to do  well on the Eliquis. No events or concerns for recurrence. Continue Eliquis at current dose. Will continue to monitor his creatinine and CrCl- may need dose adjustment in the future.   RTC 6 months APP, labs -[place]  Rushie Chestnut, PA-C 10/2/20249:58 AM

## 2023-04-11 NOTE — Progress Notes (Signed)
Addendum: Reviewed and agree with assessment and management plan. Kadijah Shamoon M, MD  

## 2023-04-12 ENCOUNTER — Encounter (HOSPITAL_BASED_OUTPATIENT_CLINIC_OR_DEPARTMENT_OTHER): Payer: Self-pay | Admitting: Pulmonary Disease

## 2023-04-12 ENCOUNTER — Ambulatory Visit (HOSPITAL_BASED_OUTPATIENT_CLINIC_OR_DEPARTMENT_OTHER): Payer: Medicare PPO | Admitting: Pulmonary Disease

## 2023-04-12 VITALS — BP 118/72 | HR 58 | Resp 16 | Ht 67.5 in | Wt 224.5 lb

## 2023-04-12 DIAGNOSIS — J4530 Mild persistent asthma, uncomplicated: Secondary | ICD-10-CM

## 2023-04-12 MED ORDER — FLUTICASONE-SALMETEROL 250-50 MCG/ACT IN AEPB: 1.0000 | INHALATION_SPRAY | Freq: Two times a day (BID) | RESPIRATORY_TRACT | 5 refills | Status: DC

## 2023-04-12 MED ORDER — SPIRIVA RESPIMAT 2.5 MCG/ACT IN AERS: 2.0000 | INHALATION_SPRAY | Freq: Every day | RESPIRATORY_TRACT | 11 refills | Status: DC

## 2023-04-12 NOTE — Progress Notes (Signed)
Subjective:   PATIENT ID: Noah Barnes GENDER: male DOB: 11/24/1945, MRN: [id number]   HPI  Chief Complaint  Patient presents with   Follow-up    summer wasn't bad, preperation for winter now    Reason for Visit: Follow-up  [name] is a 77 year old male former smoker with HTN, DM2, hx DVT/PE in 2020 who presents for follow-up.  Synopsis: He reports many years of chest congestion worsened with cold weather. He has shortness of breath and congested cough. Denies wheezing. No limitation in activity and perform house work. He has been on Advair twice a day for three months and has made minimal improvement. Mucinex is ineffective.  On review of EMR he was seen by his PCP Dr Noah Barnes for acute visit for right chest tightness/soreness associated with cough. He was treated with augmentin for possible pneumonia. His symptoms have persistent since then for >2 months and are worse than his baseline symptoms.   11/03/21 Since our last visit he had Spiriva added to his Advair and rx flutter valve. He has been compliant with his inhalers. Uses nebulizer once a day. Has not used flutter valve. Feels his current regimen is working for him. Cold weather usually triggers him and causes chest tightening. With warmer weather he symptoms improved. Denies cough. Shortness of breath with exertion. No current chest tightness.  02/27/22 He is compliant with Advair and Spiriva. His breathing has been better this week due to warm weather. Fall and winter is his worst symptom as cold weather triggers and changes in humidity. Denies shortness of breath, cough or wheezing. He was last treated for exacerbation in early Aug 2023 with antibiotics, no steroids.  05/19/22 Since our last visit, he had outpatient exacerbation in Sept/October. He is doing well on increased dose of Advair 250 and Spiriva. Denies coughing, shortness of breath or wheezing.Late fall and winter are worst times. No limitations in  activity. No dedicated exercise  04/12/23 Since our last visit he has only been on the Spiriva and feels symptoms are well controlled. Previously needed Wixela 250 in fall and spring to help minimize symptoms since fall and winter are his worst times.  Denies shortness of breath, cough or wheezing. Not exercising regularly.  Social History: Former smoker ~ 7 pack years   Past Medical History:  Diagnosis Date   CHF (congestive heart failure) (HCC)    Chronic diastolic heart failure (HCC)    Constipation    Diabetes mellitus    Diverticulitis    Diverticulosis    ED (erectile dysfunction)    Gout    Heart murmur    Hyperlipidemia    Hypertension    IBS (irritable bowel syndrome)    Renal calculus    Renal insufficiency    Tubular adenoma of colon      Family History  Problem Relation Age of Onset   Heart attack Mother 66   Hypertension Mother    Prostate cancer Father 30   Diabetes Father    Irritable bowel syndrome Father    Irritable bowel syndrome Sister        x 2   Colon cancer Brother        dx in his late 25's   Heart disease Brother    Irritable bowel syndrome Brother    Stroke Neg Hx    Esophageal cancer Neg Hx    Rectal cancer Neg Hx    Stomach cancer Neg Hx  Social History   Occupational History   Occupation: Psychologist, counselling: [employer]    Comment: [phone]  Tobacco Use   Smoking status: Former    Current packs/day: 0.00    Average packs/day: 0.3 packs/day for 23.0 years (6.9 ttl pk-yrs)    Types: Cigarettes    Start date: 07/03/1968    Quit date: 07/04/1991    Years since quitting: 31.7   Smokeless tobacco: Never   Tobacco comments:    smoked age [age], up to 1/3 ppd; may be less  Vaping Use   Vaping status: Never Used  Substance and Sexual Activity   Alcohol use: Not Currently    Comment: rarely   Drug use: No   Sexual activity: Yes    Allergies  Allergen Reactions   Ace Inhibitors Other (See  Comments)    Severe AKI due to ARB + dehydration Aug 2016   Angiotensin Receptor Blockers Other (See Comments)    Severe AKI due to ARB + dehydration Aug 2016     Outpatient Medications Prior to Visit  Medication Sig Dispense Refill   ACCU-CHEK GUIDE test strip USE AS DIRECTED TO TEST BLOOD SUGAR TWICE DAILY 100 strip 3   Accu-Chek Softclix Lancets lancets CHECK BLOOD SUGAR TWICE A DAY. 100 each 2   albuterol (PROVENTIL) (2.5 MG/3ML) 0.083% nebulizer solution Take 3 mLs (2.5 mg total) by nebulization every 6 (six) hours as needed for wheezing or shortness of breath. 75 mL 5   albuterol (VENTOLIN HFA) 108 (90 Base) MCG/ACT inhaler Inhale 2 puffs into the lungs every 6 (six) hours as needed for wheezing or shortness of breath. 8 g 0   allopurinol (ZYLOPRIM) 300 MG tablet Take 1 tablet by mouth once daily 90 tablet 0   atorvastatin (LIPITOR) 20 MG tablet Take 1 tablet by mouth once daily 90 tablet 0   Blood Glucose Monitoring Suppl (ACCU-CHEK GUIDE ME) w/Device KIT 1 each by Does not apply route in the morning and at bedtime. Use Accu Chek Guide me device to check blood sugar twice daily. 1 kit 0   ELIQUIS 2.5 MG TABS tablet Take 1 tablet by mouth twice daily 180 tablet 0   FERREX 150 150 MG capsule Take 1 capsule by mouth once daily 90 capsule 0   furosemide (LASIX) 40 MG tablet Take 40 mg by mouth daily.     glimepiride (AMARYL) 1 MG tablet Take 1 tablet (1 mg total) by mouth daily with breakfast. 90 tablet 0   hydrALAZINE (APRESOLINE) 50 MG tablet Take 1 tablet (50 mg total) by mouth 2 (two) times daily. Annual appt due in Aug must see provider for future refills 180 tablet 0   linaclotide (LINZESS) 290 MCG CAPS capsule Take 1 capsule (290 mcg total) by mouth daily before breakfast. 90 capsule 1   metoprolol tartrate (LOPRESSOR) 100 MG tablet Take 1 tablet by mouth twice daily 180 tablet 0   Semaglutide (RYBELSUS) 14 MG TABS TAKE 1 TABLET BY MOUTH IN THE MORNING 30 MINUTES BEFORE BREAKFAST WITH  SIP OF WATER 30 tablet 3   sildenafil (VIAGRA) 100 MG tablet TAKE 1 TABLET BY MOUTH EVERY DAY AS NEEDED FOR ERECTILE DYSFUNCTION 6 tablet 1   tamsulosin (FLOMAX) 0.4 MG CAPS capsule TAKE 1 CAPSULE BY MOUTH ONCE DAILY AFTER SUPPER 90 capsule 0   Vitamin D, Ergocalciferol, (DRISDOL) 1.25 MG (50000 UNIT) CAPS capsule Take 50,000 Units by mouth once a week.  fluticasone-salmeterol (ADVAIR DISKUS) 250-50 MCG/ACT AEPB Inhale 1 puff into the lungs in the morning and at bedtime. 60 each 5   Tiotropium Bromide Monohydrate (SPIRIVA RESPIMAT) 2.5 MCG/ACT AERS Inhale 2 puffs into the lungs daily. 4 g 5   No facility-administered medications prior to visit.    Review of Systems  Constitutional:  Negative for chills, diaphoresis, fever, malaise/fatigue and weight loss.  HENT:  Negative for congestion.   Respiratory:  Negative for cough, hemoptysis, sputum production, shortness of breath and wheezing.   Cardiovascular:  Negative for chest pain, palpitations and leg swelling.     Objective:   Vitals:   04/12/23 1012  BP: 118/72  Pulse: (!) 58  Resp: 16  SpO2: 99%  Weight: 224 lb 8 oz (101.8 kg)  Height: 5' 7.5" (1.715 m)   SpO2: 99 %  Physical Exam: General: Well-appearing, no acute distress HENT: [place], AT Eyes: EOMI, no scleral icterus Respiratory: Clear to auscultation bilaterally.  No crackles, wheezing or rales Cardiovascular: RRR, -M/R/G, no JVD Extremities:-Edema,-tenderness Neuro: AAO x4, CNII-XII grossly intact Psych: Normal mood, normal affect  Data Reviewed:  Imaging: CXR 07/26/21 - No active cardiopulmonary disease. No infiltrate effusion or edema CXR 01/31/22 - No active cardiopulmonary disease  PFT: 11/03/21 FVC 2.78 (83%) FEV1 2.35 (95%) Ratio 84  TLC 81% DLCO 80% Interpretation: No obstructive or restrictive defect. Normal lung volumes and normal DLCO.  Labs: CBC    Component Value Date/Time   WBC 4.6 04/04/2023 0933   WBC 4.2 08/11/2022 0845   RBC 5.08 04/04/2023  0933   HGB 13.7 04/04/2023 0933   HCT 42.8 04/04/2023 0933   PLT 204 04/04/2023 0933   MCV 84.3 04/04/2023 0933   MCH 27.0 04/04/2023 0933   MCHC 32.0 04/04/2023 0933   RDW 14.0 04/04/2023 0933   LYMPHSABS 1.3 04/04/2023 0933   MONOABS 0.4 04/04/2023 0933   EOSABS 0.2 04/04/2023 0933   BASOSABS 0.0 04/04/2023 0933   Absolute eos 03/25/21 - 100     Assessment & Plan:   Discussion: 77 year old male former smoker with HTN, DM2, hx DVT/PE in 2020 who presents for follow-up. Last exacerbation in Sept/Oct 2023, >1 year ago. Well controlled on LAMA but will start triple therapy during fall/spring. Discussed clinical course and management of asthma including bronchodilator regimen, preventive care including vaccinations and action plan for exacerbation.  Asthmatic bronchitis Chronic bronchitis --RESTART Wixela 250-50 mcg ONE puff TWICE a day. 6 month refill --CONTINUE Spiriva 2.5 mcg TWO puffs TWICE a day. 12 month refill --CONTINUE albuterol nebulizer AS NEEDED for shortness of breath or wheezing --Use flutter valve as needed for chest congestion --Up to date on vaccine  Action Plan Increase Albuterol for worsening shortness of breath, wheezing and cough. If you symptoms do not improve in 24-48 hours, please our office for evaluation and/or prednisone taper and/or antibiotics.   Health Maintenance Immunization History  Administered Date(s) Administered   Fluad Quad(high Dose 65+) 03/06/2019, 05/05/2020, 03/26/2021, 04/04/2022   Fluad Trivalent(High Dose 65+) 03/19/2023   Influenza, High Dose Seasonal PF 07/12/2016, 04/10/2017, 03/22/2018   PFIZER Comirnaty(Gray Top)Covid-19 Tri-Sucrose Vaccine 01/14/2021   PFIZER(Purple Top)SARS-COV-2 Vaccination 08/13/2019, 09/10/2019, 04/06/2020   Pfizer Covid-19 Vaccine Bivalent Booster 76yrs & up 06/08/2021   Pfizer(Comirnaty)Fall Seasonal Vaccine 12 years and older 04/19/2022, 03/26/2023   Pneumococcal Conjugate-13 01/06/2019   Pneumococcal  Polysaccharide-23 03/01/2020   CT Lung Screen - not qualified. Insufficient tobacco history  No orders of the defined types were placed in this encounter.  Meds ordered this encounter  Medications   fluticasone-salmeterol (ADVAIR DISKUS) 250-50 MCG/ACT AEPB    Sig: Inhale 1 puff into the lungs in the morning and at bedtime.    Dispense:  60 each    Refill:  5   Tiotropium Bromide Monohydrate (SPIRIVA RESPIMAT) 2.5 MCG/ACT AERS    Sig: Inhale 2 puffs into the lungs daily.    Dispense:  4 g    Refill:  11    Please send all further refill requests electronically only.    Return in about 6 months (around 10/11/2023).  I have spent a total time of 30-minutes on the day of the appointment including chart review, data review, collecting history, coordinating care and discussing medical diagnosis and plan with the patient/family. Past medical history, allergies, medications were reviewed. Pertinent imaging, labs and tests included in this note have been reviewed and interpreted independently by me.  Kevin Mario Mechele Collin, MD [facility] 04/12/2023 10:24 AM

## 2023-04-12 NOTE — Patient Instructions (Signed)
  Asthmatic bronchitis Chronic bronchitis --RESTART Wixela 250-50 mcg ONE puff TWICE a day. 6 month refill --CONTINUE Spiriva 2.5 mcg TWO puffs TWICE a day. 12 month refill --CONTINUE albuterol nebulizer AS NEEDED for shortness of breath or wheezing --Use flutter valve as needed for chest congestion --Up to date on vaccine  Action Plan Increase Albuterol for worsening shortness of breath, wheezing and cough. If you symptoms do not improve in 24-48 hours, please our office for evaluation and/or prednisone taper and/or antibiotics.

## 2023-04-15 ENCOUNTER — Other Ambulatory Visit: Payer: Self-pay | Admitting: Internal Medicine

## 2023-05-05 ENCOUNTER — Other Ambulatory Visit: Payer: Self-pay | Admitting: Internal Medicine

## 2023-05-07 ENCOUNTER — Other Ambulatory Visit: Payer: Self-pay | Admitting: Internal Medicine

## 2023-05-09 ENCOUNTER — Other Ambulatory Visit: Payer: Self-pay | Admitting: Internal Medicine

## 2023-05-10 ENCOUNTER — Other Ambulatory Visit: Payer: Self-pay | Admitting: Internal Medicine

## 2023-05-11 ENCOUNTER — Other Ambulatory Visit: Payer: Self-pay

## 2023-05-11 MED ORDER — ACCU-CHEK GUIDE VI STRP: ORAL_STRIP | 3 refills | Status: DC

## 2023-05-11 MED ORDER — ACCU-CHEK SOFTCLIX LANCETS MISC: 2 refills | Status: DC

## 2023-05-14 ENCOUNTER — Other Ambulatory Visit: Payer: Self-pay

## 2023-05-14 ENCOUNTER — Other Ambulatory Visit (INDEPENDENT_AMBULATORY_CARE_PROVIDER_SITE_OTHER): Payer: Medicare PPO

## 2023-05-14 DIAGNOSIS — Z794 Long term (current) use of insulin: Secondary | ICD-10-CM

## 2023-05-14 DIAGNOSIS — N183 Chronic kidney disease, stage 3 unspecified: Secondary | ICD-10-CM

## 2023-05-14 DIAGNOSIS — E1165 Type 2 diabetes mellitus with hyperglycemia: Secondary | ICD-10-CM

## 2023-05-14 LAB — COMPREHENSIVE METABOLIC PANEL
ALT: 56 U/L — ABNORMAL HIGH (ref 0–53)
AST: 19 U/L (ref 0–37)
Albumin: 3.9 g/dL (ref 3.5–5.2)
Alkaline Phosphatase: 76 U/L (ref 39–117)
BUN: 30 mg/dL — ABNORMAL HIGH (ref 6–23)
CO2: 17 meq/L — ABNORMAL LOW (ref 19–32)
Calcium: 9.6 mg/dL (ref 8.4–10.5)
Chloride: 114 meq/L — ABNORMAL HIGH (ref 96–112)
Creatinine, Ser: 1.58 mg/dL — ABNORMAL HIGH (ref 0.40–1.50)
GFR: 42.27 mL/min — ABNORMAL LOW (ref >60.00)
Glucose, Bld: 103 mg/dL — ABNORMAL HIGH (ref 70–99)
Potassium: 4.1 meq/L (ref 3.5–5.1)
Sodium: 139 meq/L (ref 135–145)
Total Bilirubin: 0.5 mg/dL (ref 0.2–1.2)
Total Protein: 6.6 g/dL (ref 6.0–8.3)

## 2023-05-14 LAB — MICROALBUMIN / CREATININE URINE RATIO
Creatinine,U: 147.3 mg/dL
Microalb Creat Ratio: 3 mg/g (ref 0.0–30.0)
Microalb, Ur: 4.3 mg/dL — ABNORMAL HIGH (ref 0.0–1.9)

## 2023-05-14 LAB — HEMOGLOBIN A1C: Hgb A1c MFr Bld: 6.1 % (ref 4.6–6.5)

## 2023-05-15 ENCOUNTER — Other Ambulatory Visit: Payer: Self-pay

## 2023-05-15 LAB — FRUCTOSAMINE: Fructosamine: 211 umol/L (ref 0–285)

## 2023-05-18 ENCOUNTER — Encounter: Payer: Self-pay | Admitting: Endocrinology

## 2023-05-18 ENCOUNTER — Ambulatory Visit: Payer: Medicare PPO | Admitting: Endocrinology

## 2023-05-18 VITALS — BP 146/80 | HR 62 | Resp 20 | Ht 67.5 in | Wt 221.6 lb

## 2023-05-18 DIAGNOSIS — E118 Type 2 diabetes mellitus with unspecified complications: Secondary | ICD-10-CM | POA: Diagnosis not present

## 2023-05-18 DIAGNOSIS — Z7984 Long term (current) use of oral hypoglycemic drugs: Secondary | ICD-10-CM

## 2023-05-18 NOTE — Progress Notes (Unsigned)
Outpatient Endocrinology Note Noah [name], MD  05/18/23  Patient's Name: Noah Barnes    DOB: Feb 12, 1946    MRN: [id number]                                                    REASON OF VISIT: Follow up of type 2 diabetes mellitus  REFERRING PROVIDER:   PCP: Pincus Sanes, MD  HISTORY OF PRESENT ILLNESS:   Noah Barnes is a 77 y.o. old male with past medical history listed below, is here for follow up for type 2 diabetes mellitus.  Patient was previously seen by Dr. Lucianne Muss in July 2024.  Pertinent Diabetes History: Patient was diagnosed with type 2 diabetes mellitus in 2008.  Patient has controlled type 2 diabetes mellitus.  Chronic Diabetes Complications : Retinopathy: no. Last ophthalmology exam was done on 12/2021, following with ophthalmology regularly.  Nephropathy: CKD III, following with nephrology. Peripheral neuropathy: no Coronary artery disease: no Stroke: on  Relevant comorbidities and cardiovascular risk factors: Obesity: yes Body mass index is 34.2 kg/m.  Hypertension: Yes  Hyperlipidemia : Yes, on statin   Current / Home Diabetic regimen includes: Rybelsus 14 mg daily. Amaryl 1 mg daily with supper.  Prior diabetic medications: Metformin, Janumet, Invokana in the past.  Glycemic data:    Accu-Chek guide glucometer  Griselda's glucose meter is computer downloaded. Raw data and trends analyzed.   -  He has been testing his blood glucoses 1.4 times daily.  From October 1 to May 18, 2023. -  Average glucose for the last 14 days is 108 mg/dl, range 71 - 829.  He has been taking in the morning fasting and at bedtime. -  Trends noted: Acceptable blood sugar, fasting some of the blood sugar 95, 100, 94, 98, 102, 107, 102.  Bedtime blood sugar 71, 133, 107, 130, 142.  Hypoglycemia: Patient has no hypoglycemic episodes. Patient has hypoglycemia awareness.  Factors modifying glucose control: 1.  Diabetic diet assessment: 3 meals a day.  2.  Staying  active or exercising: No formal exercise.  3.  Medication compliance: compliant all of the time.  Interval history  Glucometer data as reviewed above.  Acceptable blood sugar.  Diabetes regimen as noted above.  He has no complaints today.  REVIEW OF SYSTEMS As per history of present illness.   PAST MEDICAL HISTORY: Past Medical History:  Diagnosis Date   CHF (congestive heart failure) (HCC)    Chronic diastolic heart failure (HCC)    Constipation    Diabetes mellitus    Diverticulitis    Diverticulosis    ED (erectile dysfunction)    Gout    Heart murmur    Hyperlipidemia    Hypertension    IBS (irritable bowel syndrome)    Renal calculus    Renal insufficiency    Tubular adenoma of colon     PAST SURGICAL HISTORY: Past Surgical History:  Procedure Laterality Date   COLONOSCOPY  1998   negative; no F/U   EXTRACORPOREAL SHOCK WAVE LITHOTRIPSY Left 03/20/2019   Procedure: EXTRACORPOREAL SHOCK WAVE LITHOTRIPSY (ESWL);  Surgeon: Malen Gauze, MD;  Location: [facility];  Service: Urology;  Laterality: Left;   knee effusion tapped      ALLERGIES: Allergies  Allergen Reactions   Ace Inhibitors Other (See Comments)    Severe  AKI due to ARB + dehydration Aug 2016   Angiotensin Receptor Blockers Other (See Comments)    Severe AKI due to ARB + dehydration Aug 2016    FAMILY HISTORY:  Family History  Problem Relation Age of Onset   Heart attack Mother 51   Hypertension Mother    Prostate cancer Father 24   Diabetes Father    Irritable bowel syndrome Father    Irritable bowel syndrome Sister        x 2   Colon cancer Brother        dx in his late 6's   Heart disease Brother    Irritable bowel syndrome Brother    Stroke Neg Hx    Esophageal cancer Neg Hx    Rectal cancer Neg Hx    Stomach cancer Neg Hx     SOCIAL HISTORY: Social History   Socioeconomic History   Marital status: Married    Spouse name: Not on file   Number of children: 1   Years of  education: Not on file   Highest education level: Not on file  Occupational History   Occupation: Psychologist, counselling: [employer]    Comment: [phone]  Tobacco Use   Smoking status: Former    Current packs/day: 0.00    Average packs/day: 0.3 packs/day for 23.0 years (6.9 ttl pk-yrs)    Types: Cigarettes    Start date: 07/03/1968    Quit date: 07/04/1991    Years since quitting: 31.8   Smokeless tobacco: Never   Tobacco comments:    smoked age [age], up to 1/3 ppd; may be less  Vaping Use   Vaping status: Never Used  Substance and Sexual Activity   Alcohol use: Not Currently    Comment: rarely   Drug use: No   Sexual activity: Yes  Other Topics Concern   Not on file  Social History Narrative   ** Merged History Encounter **       Social Determinants of Health   Financial Resource Strain: Low Risk  (12/26/2022)   Overall Financial Resource Strain (CARDIA)    Difficulty of Paying Living Expenses: Not hard at all  Food Insecurity: No Food Insecurity (12/26/2022)   Hunger Vital Sign    Worried About Running Out of Food in the Last Year: Never true    Ran Out of Food in the Last Year: Never true  Transportation Needs: No Transportation Needs (12/26/2022)   PRAPARE - Administrator, Civil Service (Medical): No    Lack of Transportation (Non-Medical): No  Physical Activity: Insufficiently Active (12/26/2022)   Exercise Vital Sign    Days of Exercise per Week: 3 days    Minutes of Exercise per Session: 20 min  Stress: No Stress Concern Present (12/26/2022)   [employer] of Occupational Health - Occupational Stress Questionnaire    Feeling of Stress : Not at all  Social Connections: Socially Integrated (12/26/2022)   Social Connection and Isolation Panel [NHANES]    Frequency of Communication with Friends and Family: More than three times a week    Frequency of Social Gatherings with Friends and Family: More than three times a week     Attends Religious Services: More than 4 times per year    Active Member of [employer] or Organizations: Yes    Attends Engineer, structural: More than 4 times per year    Marital Status: Married    MEDICATIONS:  Current Outpatient Medications  Medication Sig Dispense Refill   Accu-Chek Softclix Lancets lancets CHECK BLOOD SUGAR TWICE A DAY. 100 each 2   albuterol (PROVENTIL) (2.5 MG/3ML) 0.083% nebulizer solution Take 3 mLs (2.5 mg total) by nebulization every 6 (six) hours as needed for wheezing or shortness of breath. 75 mL 5   albuterol (VENTOLIN HFA) 108 (90 Base) MCG/ACT inhaler Inhale 2 puffs into the lungs every 6 (six) hours as needed for wheezing or shortness of breath. 8 g 0   allopurinol (ZYLOPRIM) 300 MG tablet Take 1 tablet by mouth once daily 90 tablet 0   atorvastatin (LIPITOR) 20 MG tablet Take 1 tablet by mouth once daily 90 tablet 0   Blood Glucose Monitoring Suppl (ACCU-CHEK GUIDE ME) w/Device KIT 1 each by Does not apply route in the morning and at bedtime. Use Accu Chek Guide me device to check blood sugar twice daily. 1 kit 0   ELIQUIS 2.5 MG TABS tablet Take 1 tablet by mouth twice daily 180 tablet 0   FERREX 150 150 MG capsule Take 1 capsule by mouth once daily 90 capsule 0   fluticasone-salmeterol (ADVAIR DISKUS) 250-50 MCG/ACT AEPB Inhale 1 puff into the lungs in the morning and at bedtime. 60 each 5   furosemide (LASIX) 40 MG tablet Take 40 mg by mouth daily.     glimepiride (AMARYL) 1 MG tablet Take 1 tablet (1 mg total) by mouth daily with breakfast. 90 tablet 0   glucose blood (ACCU-CHEK GUIDE) test strip USE AS DIRECTED TO TEST BLOOD SUGAR TWICE DAILY 100 strip 3   hydrALAZINE (APRESOLINE) 50 MG tablet TAKE 1 TABLET BY MOUTH TWICE DAILY **ANNUAL  APPOINTMENT  DUE  IN  [place].  MUST  SEE  PROVIDER  FOR  FUTURE  REFILLS** 180 tablet 2   linaclotide (LINZESS) 290 MCG CAPS capsule Take 1 capsule (290 mcg total) by mouth daily before breakfast. 90 capsule 1    metoprolol tartrate (LOPRESSOR) 100 MG tablet Take 1 tablet by mouth twice daily 180 tablet 0   Semaglutide (RYBELSUS) 14 MG TABS TAKE 1 TABLET BY MOUTH IN THE MORNING 30 MINUTES BEFORE BREAKFAST WITH SIP OF WATER 30 tablet 3   sildenafil (VIAGRA) 100 MG tablet TAKE 1 TABLET BY MOUTH EVERY DAY AS NEEDED FOR ERECTILE DYSFUNCTION 6 tablet 1   tamsulosin (FLOMAX) 0.4 MG CAPS capsule TAKE 1 CAPSULE BY MOUTH ONCE DAILY AFTER SUPPER 90 capsule 0   Tiotropium Bromide Monohydrate (SPIRIVA RESPIMAT) 2.5 MCG/ACT AERS Inhale 2 puffs into the lungs daily. 4 g 11   Vitamin D, Ergocalciferol, (DRISDOL) 1.25 MG (50000 UNIT) CAPS capsule Take 50,000 Units by mouth once a week.     No current facility-administered medications for this visit.    PHYSICAL EXAM: Vitals:   05/18/23 0900 05/18/23 0901  BP: (!) 150/90 (!) 146/80  Pulse: 62   Resp: 20   SpO2: 95%   Weight: 221 lb 9.6 oz (100.5 kg)   Height: 5' 7.5" (1.715 m)    Body mass index is 34.2 kg/m.  Wt Readings from Last 3 Encounters:  05/18/23 221 lb 9.6 oz (100.5 kg)  04/12/23 224 lb 8 oz (101.8 kg)  04/04/23 222 lb 0.6 oz (100.7 kg)    General: Well developed, well nourished male in no apparent distress.  HEENT: AT/[place], no external lesions.  Eyes: Conjunctiva clear and no icterus. Neck: Neck supple  Lungs: Respirations not labored Neurologic: Alert, oriented, normal speech Extremities / Skin: Dry. No sores  or rashes noted. Psychiatric: Does not appear depressed or anxious  Diabetic Foot Exam - Simple   No data filed    LABS Reviewed Lab Results  Component Value Date   HGBA1C 6.1 05/14/2023   HGBA1C 5.7 01/11/2023   HGBA1C 5.8 08/11/2022   Lab Results  Component Value Date   FRUCTOSAMINE 211 05/14/2023   FRUCTOSAMINE 241 02/24/2020   FRUCTOSAMINE 263 05/15/2019   Lab Results  Component Value Date   CHOL 142 08/11/2022   HDL 50.40 08/11/2022   LDLCALC 78 08/11/2022   LDLDIRECT 80.1 04/15/2014   TRIG 66.0 08/11/2022    CHOLHDL 3 08/11/2022   Lab Results  Component Value Date   MICRALBCREAT 3.0 05/14/2023   MICRALBCREAT 3.4 01/11/2023   Lab Results  Component Value Date   CREATININE 1.58 (H) 05/14/2023   Lab Results  Component Value Date   GFR 42.27 (L) 05/14/2023    ASSESSMENT / PLAN  1. Diabetes mellitus type 2 with complications (HCC)     Diabetes Mellitus type 2, complicated by CKD. - Diabetic status / severity: Controlled.  Lab Results  Component Value Date   HGBA1C 6.1 05/14/2023    - Hemoglobin A1c goal : <7%  - Medications: No change.  I) continue Rybelsus 14 mg daily. II) continue Amaryl 1 mg daily.  - Home glucose testing: In the morning fasting and at bedtime. - Discussed/ Gave Hypoglycemia treatment plan.  # Consult : not required at this time.   # Annual urine for microalbuminuria/ creatinine ratio, no microalbuminuria currently, continue ACE/ARB *** Last  Lab Results  Component Value Date   MICRALBCREAT 3.0 05/14/2023    # Foot check nightly / neuropathy, continue ***  # Annual dilated diabetic eye exams.   - Diet: {dmclinicdiabeticdiettype:42746::"Make healthy diabetic food choices","Eat reasonable portion sizes to promote a healthy weight"} - Life style / activity / exercise: {dmclinicactivity:42747}  2. Blood pressure  -  BP Readings from Last 1 Encounters:  05/18/23 (!) 146/80    - Control {IS/IS NOT:9024::"is"} in target.  - {endo rb hypertension recommendations:35631::"No change in current plans."}  3. Lipid status / Hyperlipidemia - Last  Lab Results  Component Value Date   LDLCALC 78 08/11/2022   - Continue ***   Diagnoses and all orders for this visit:  Diabetes mellitus type 2 with complications (HCC) -     Hemoglobin A1c; Future -     Basic metabolic panel; Future    DISPOSITION Follow up in clinic in   months suggested.   All questions answered and patient verbalized understanding of the plan.  Noah Tenoch Mcclure, MD [facility]  Endocrinology [facility] [address], [address] [place], [place] [address] Phone # [phone]  At least part of this note was generated using voice recognition software. Inadvertent word errors may have occurred, which were not recognized during the proofreading process.

## 2023-06-03 ENCOUNTER — Other Ambulatory Visit: Payer: Self-pay | Admitting: Internal Medicine

## 2023-06-08 ENCOUNTER — Other Ambulatory Visit: Payer: Self-pay | Admitting: Hematology & Oncology

## 2023-06-08 DIAGNOSIS — Z86718 Personal history of other venous thrombosis and embolism: Secondary | ICD-10-CM

## 2023-06-08 DIAGNOSIS — Z86711 Personal history of pulmonary embolism: Secondary | ICD-10-CM

## 2023-06-22 ENCOUNTER — Telehealth: Payer: Self-pay | Admitting: Physician Assistant

## 2023-06-22 MED ORDER — PEG 3350-KCL-NA BICARB-NACL 420 G PO SOLR: ORAL | 0 refills | Status: DC

## 2023-06-22 NOTE — Telephone Encounter (Signed)
Spoke to pt and he is aware.

## 2023-06-22 NOTE — Telephone Encounter (Signed)
Pt states he is having issues with constipation and is requesting a bowel prep be sent to the pharmacy for him. He reports he does not think he can wait until Monday. Please advise.

## 2023-06-22 NOTE — Telephone Encounter (Signed)
Inbound call from patient, states he would like for a nurse to send in prep medication for him to do a bowel purge. Patient states he is not sure if he can wait till Monday. Please advise.

## 2023-06-25 ENCOUNTER — Ambulatory Visit: Payer: Medicare PPO | Admitting: Physician Assistant

## 2023-06-25 ENCOUNTER — Ambulatory Visit (INDEPENDENT_AMBULATORY_CARE_PROVIDER_SITE_OTHER)
Admission: RE | Admit: 2023-06-25 | Discharge: 2023-06-25 | Disposition: A | Discharge status: Home / Self Care | Payer: Medicare PPO | Source: Ambulatory Visit | Attending: Physician Assistant | Admitting: Physician Assistant

## 2023-06-25 ENCOUNTER — Encounter: Payer: Self-pay | Admitting: Physician Assistant

## 2023-06-25 VITALS — BP 142/64 | HR 60 | Ht 67.5 in | Wt 215.0 lb

## 2023-06-25 DIAGNOSIS — K5900 Constipation, unspecified: Secondary | ICD-10-CM | POA: Diagnosis not present

## 2023-06-25 DIAGNOSIS — R1030 Lower abdominal pain, unspecified: Secondary | ICD-10-CM | POA: Diagnosis not present

## 2023-06-25 DIAGNOSIS — K5904 Chronic idiopathic constipation: Secondary | ICD-10-CM | POA: Diagnosis not present

## 2023-06-25 DIAGNOSIS — R140 Abdominal distension (gaseous): Secondary | ICD-10-CM | POA: Diagnosis not present

## 2023-06-25 NOTE — Progress Notes (Signed)
Chief Complaint: Abdominal pain and GERD  HPI:    Noah Barnes is a 77 year old male, known to Dr. Rhea Belton, with a past medical history as listed below including CHF, A-fib on Eliquis, constipation and IBS, who was referred to me by Noah Sanes, MD for a complaint of abdominal pain and GERD.    02/2020 colonoscopy for polyp surveillance and family history of colon cancer with moderate diverticulosis and internal hemorrhoids.  Otherwise normal.    03/30/2023 office visit with Quentin Mulling, PA-C.  At that time chronic constipation improved with Linzess 290 mcg daily.  Recommended to start a fiber supplement.  At that time Rybelsus was thought to be contributing.    05/14/2023 CMP with a creatinine 1.58 (slightly lower than baseline), glucose 103, ALT minimally elevated 56.  Hemoglobin A1c 6.1.    Today, the patient presents to clinic and tells me that he is having worsening constipation.  Apparently received a bowel prep from our clinic a couple of days ago and did have some success, with watery bowel movements but still feels full up in his lower abdomen and bloated.  Tells me that eating solid food seems to worsen this feeling so he has been having mostly liquids over the past week.  Still taking his Linzess 290 mcg daily.  Still on Rybelsus.  Describes very occasional reflux symptoms.  He has continued to pass gas.    Denies fever, chills, weight loss or blood in his stool.  Past Medical History:  Diagnosis Date   CHF (congestive heart failure) (HCC)    Chronic diastolic heart failure (HCC)    Constipation    Diabetes mellitus    Diverticulitis    Diverticulosis    ED (erectile dysfunction)    Gout    Heart murmur    Hyperlipidemia    Hypertension    IBS (irritable bowel syndrome)    Renal calculus    Renal insufficiency    Tubular adenoma of colon     Past Surgical History:  Procedure Laterality Date   COLONOSCOPY  1998   negative; no F/U   EXTRACORPOREAL SHOCK WAVE LITHOTRIPSY  Left 03/20/2019   Procedure: EXTRACORPOREAL SHOCK WAVE LITHOTRIPSY (ESWL);  Surgeon: Malen Gauze, MD;  Location: [facility];  Service: Urology;  Laterality: Left;   knee effusion tapped      Current Outpatient Medications  Medication Sig Dispense Refill   Accu-Chek Softclix Lancets lancets CHECK BLOOD SUGAR TWICE A DAY. 100 each 2   albuterol (PROVENTIL) (2.5 MG/3ML) 0.083% nebulizer solution Take 3 mLs (2.5 mg total) by nebulization every 6 (six) hours as needed for wheezing or shortness of breath. 75 mL 5   albuterol (VENTOLIN HFA) 108 (90 Base) MCG/ACT inhaler Inhale 2 puffs into the lungs every 6 (six) hours as needed for wheezing or shortness of breath. 8 g 0   allopurinol (ZYLOPRIM) 300 MG tablet Take 1 tablet by mouth once daily 90 tablet 0   atorvastatin (LIPITOR) 20 MG tablet Take 1 tablet by mouth once daily 90 tablet 0   Blood Glucose Monitoring Suppl (ACCU-CHEK GUIDE ME) w/Device KIT 1 each by Does not apply route in the morning and at bedtime. Use Accu Chek Guide me device to check blood sugar twice daily. 1 kit 0   ELIQUIS 2.5 MG TABS tablet Take 1 tablet by mouth twice daily 180 tablet 0   FERREX 150 150 MG capsule Take 1 capsule by mouth once daily 90 capsule 0   fluticasone-salmeterol (  ADVAIR DISKUS) 250-50 MCG/ACT AEPB Inhale 1 puff into the lungs in the morning and at bedtime. 60 each 5   furosemide (LASIX) 40 MG tablet Take 40 mg by mouth daily.     glimepiride (AMARYL) 1 MG tablet Take 1 tablet (1 mg total) by mouth daily with breakfast. 90 tablet 0   glucose blood (ACCU-CHEK GUIDE) test strip USE AS DIRECTED TO TEST BLOOD SUGAR TWICE DAILY 100 strip 3   hydrALAZINE (APRESOLINE) 50 MG tablet TAKE 1 TABLET BY MOUTH TWICE DAILY **ANNUAL  APPOINTMENT  DUE  IN  [place].  MUST  SEE  PROVIDER  FOR  FUTURE  REFILLS** 180 tablet 2   linaclotide (LINZESS) 290 MCG CAPS capsule Take 1 capsule (290 mcg total) by mouth daily before breakfast. 90 capsule 1   metoprolol tartrate  (LOPRESSOR) 100 MG tablet Take 1 tablet by mouth twice daily 180 tablet 0   polyethylene glycol-electrolytes (NULYTELY) 420 g solution do 6 oz every 30 mins until the impaction passes, can do 1/2 gallon on first day and 1/2 gallon on 2nd day.  Can continue 2 fleets enemas a day until it resolves. 4000 mL 0   Semaglutide (RYBELSUS) 14 MG TABS TAKE 1 TABLET BY MOUTH IN THE MORNING 30 MINUTES BEFORE BREAKFAST WITH SIP OF WATER 30 tablet 3   sildenafil (VIAGRA) 100 MG tablet TAKE 1 TABLET BY MOUTH EVERY DAY AS NEEDED FOR ERECTILE DYSFUNCTION 6 tablet 1   tamsulosin (FLOMAX) 0.4 MG CAPS capsule TAKE 1 CAPSULE BY MOUTH ONCE DAILY AFTER SUPPER 90 capsule 0   Tiotropium Bromide Monohydrate (SPIRIVA RESPIMAT) 2.5 MCG/ACT AERS Inhale 2 puffs into the lungs daily. 4 g 11   Vitamin D, Ergocalciferol, (DRISDOL) 1.25 MG (50000 UNIT) CAPS capsule Take 50,000 Units by mouth once a week.     No current facility-administered medications for this visit.    Allergies as of 06/25/2023 - Review Complete 06/25/2023  Allergen Reaction Noted   Ace inhibitors Other (See Comments) 02/26/2015   Angiotensin receptor blockers Other (See Comments) 02/26/2015    Family History  Problem Relation Age of Onset   Heart attack Mother 14   Hypertension Mother    Prostate cancer Father 70   Diabetes Father    Irritable bowel syndrome Father    Irritable bowel syndrome Sister        x 2   Colon cancer Brother        dx in his late 69's   Heart disease Brother    Irritable bowel syndrome Brother    Stroke Neg Hx    Esophageal cancer Neg Hx    Rectal cancer Neg Hx    Stomach cancer Neg Hx     Social History   Socioeconomic History   Marital status: Married    Spouse name: Not on file   Number of children: 1   Years of education: Not on file   Highest education level: Not on file  Occupational History   Occupation: Psychologist, counselling: [employer]    Comment: [phone]  Tobacco Use    Smoking status: Former    Current packs/day: 0.00    Average packs/day: 0.3 packs/day for 23.0 years (6.9 ttl pk-yrs)    Types: Cigarettes    Start date: 07/03/1968    Quit date: 07/04/1991    Years since quitting: 31.9   Smokeless tobacco: Never   Tobacco comments:    smoked age [age], up to 1/3  ppd; may be less  Vaping Use   Vaping status: Never Used  Substance and Sexual Activity   Alcohol use: Not Currently    Comment: rarely   Drug use: No   Sexual activity: Yes  Other Topics Concern   Not on file  Social History Narrative   ** Merged History Encounter **       Social Drivers of Health   Financial Resource Strain: Low Risk  (12/26/2022)   Overall Financial Resource Strain (CARDIA)    Difficulty of Paying Living Expenses: Not hard at all  Food Insecurity: No Food Insecurity (12/26/2022)   Hunger Vital Sign    Worried About Running Out of Food in the Last Year: Never true    Ran Out of Food in the Last Year: Never true  Transportation Needs: No Transportation Needs (12/26/2022)   PRAPARE - Administrator, Civil Service (Medical): No    Lack of Transportation (Non-Medical): No  Physical Activity: Insufficiently Active (12/26/2022)   Exercise Vital Sign    Days of Exercise per Week: 3 days    Minutes of Exercise per Session: 20 min  Stress: No Stress Concern Present (12/26/2022)   [employer] of Occupational Health - Occupational Stress Questionnaire    Feeling of Stress : Not at all  Social Connections: Socially Integrated (12/26/2022)   Social Connection and Isolation Panel [NHANES]    Frequency of Communication with Friends and Family: More than three times a week    Frequency of Social Gatherings with Friends and Family: More than three times a week    Attends Religious Services: More than 4 times per year    Active Member of [employer] or Organizations: Yes    Attends Engineer, structural: More than 4 times per year    Marital Status: Married   Catering manager Violence: Not At Risk (12/26/2022)   Humiliation, Afraid, Rape, and Kick questionnaire    Fear of Current or Ex-Partner: No    Emotionally Abused: No    Physically Abused: No    Sexually Abused: No    Review of Systems:    Constitutional: No weight loss, fever or chills Skin: No rash or itching Cardiovascular: No chest pain, chest pressure or palpitations   Respiratory: No SOB or cough Gastrointestinal: See HPI and otherwise negative   Physical Exam:  Vital signs: BP (!) 142/64   Pulse 60   Wt 215 lb (97.5 kg)   BMI 33.18 kg/m   Constitutional:   Pleasant AA male appears to be in NAD, Well developed, Well nourished, alert and cooperative Respiratory: Respirations even and unlabored. Lungs clear to auscultation bilaterally.   No wheezes, crackles, or rhonchi.  Cardiovascular: Normal S1, S2. No MRG. Regular rate and rhythm. No peripheral edema, cyanosis or pallor.  Gastrointestinal:  Soft, nondistended, nontender. No rebound or guarding.  Decreased bowel sounds all 4 quadrants. No appreciable masses or hepatomegaly. Rectal:  Not performed.  Psychiatric:Demonstrates good judgement and reason without abnormal affect or behaviors.  RELEVANT LABS AND IMAGING: CBC    Component Value Date/Time   WBC 4.6 04/04/2023 0933   WBC 4.2 08/11/2022 0845   RBC 5.08 04/04/2023 0933   HGB 13.7 04/04/2023 0933   HCT 42.8 04/04/2023 0933   PLT 204 04/04/2023 0933   MCV 84.3 04/04/2023 0933   MCH 27.0 04/04/2023 0933   MCHC 32.0 04/04/2023 0933   RDW 14.0 04/04/2023 0933   LYMPHSABS 1.3 04/04/2023 0933   MONOABS 0.4 04/04/2023 0933  EOSABS 0.2 04/04/2023 0933   BASOSABS 0.0 04/04/2023 0933    CMP     Component Value Date/Time   NA 139 05/14/2023 0852   K 4.1 05/14/2023 0852   CL 114 (H) 05/14/2023 0852   CO2 17 (L) 05/14/2023 0852   GLUCOSE 103 (H) 05/14/2023 0852   BUN 30 (H) 05/14/2023 0852   CREATININE 1.58 (H) 05/14/2023 0852   CREATININE 1.72 (H)  04/04/2023 0933   CALCIUM 9.6 05/14/2023 0852   PROT 6.6 05/14/2023 0852   ALBUMIN 3.9 05/14/2023 0852   AST 19 05/14/2023 0852   AST 18 04/04/2023 0933   ALT 56 (H) 05/14/2023 0852   ALT 45 (H) 04/04/2023 0933   ALKPHOS 76 05/14/2023 0852   BILITOT 0.5 05/14/2023 0852   BILITOT 0.6 04/04/2023 0933   GFRNONAA 41 (L) 04/04/2023 0933   GFRAA 43 (L) 03/25/2020 1026    Assessment: 1.  Chronic constipation: Worsening constipation recently, likely Rybelsus, currently on Linzess 290 daily which is only working occasionally 2.  GERD: Very occasional reflux symptoms, likely related to Rybelsus/diet  Plan: 1.  Discussed with patient that his Rybelsus is likely worsening symptoms.  Would recommend that he discuss this further with his PCP.  He tells me he has follow-up in a couple of months. 2.  For now provided the patient with another bowel prep simple to complete over night 3.  Ordered stat abdominal x-ray two-view to see the status of stool. 4.  Recommend the patient have MiraLAX twice daily for now to his daily Linzess 290 mcg daily.  In the future if this does not work could trial Amitiza or other product. 5.  Patient to follow in clinic with me at my next available in 3 to 4 months.  Sooner if needed.  Hyacinth Meeker, PA-C [facility] 06/25/2023, 10:08 AM  Cc: Noah Sanes, MD

## 2023-06-25 NOTE — Patient Instructions (Addendum)
Your provider has requested that you have an abdominal x ray before leaving today. Please go to the basement floor to our Radiology department for the test.  Continue Linzess.   Start Miralax 1 capful twice daily in 8 ounces of liquid.  Complete Plenvu bowel prep per instructions on box today.  _______________________________________________________  If your blood pressure at your visit was 140/90 or greater, please contact your primary care physician to follow up on this.  _______________________________________________________  If you are age 59 or older, your body mass index should be between 23-30. Your Body mass index is 33.18 kg/m. If this is out of the aforementioned range listed, please consider follow up with your Primary Care Provider.  If you are age 81 or younger, your body mass index should be between 19-25. Your Body mass index is 33.18 kg/m. If this is out of the aformentioned range listed, please consider follow up with your Primary Care Provider.   ________________________________________________________  The [facility] providers would like to encourage you to use [facility] to communicate with providers for non-urgent requests or questions.  Due to long hold times on the telephone, sending your provider a message by [facility] may be a faster and more efficient way to get a response.  Please allow 48 business hours for a response.  Please remember that this is for non-urgent requests.  _______________________________________________________

## 2023-07-02 DIAGNOSIS — N1831 Chronic kidney disease, stage 3a: Secondary | ICD-10-CM | POA: Diagnosis not present

## 2023-07-05 ENCOUNTER — Other Ambulatory Visit: Payer: Self-pay | Admitting: Internal Medicine

## 2023-07-09 DIAGNOSIS — I129 Hypertensive chronic kidney disease with stage 1 through stage 4 chronic kidney disease, or unspecified chronic kidney disease: Secondary | ICD-10-CM | POA: Diagnosis not present

## 2023-07-09 DIAGNOSIS — D631 Anemia in chronic kidney disease: Secondary | ICD-10-CM | POA: Diagnosis not present

## 2023-07-09 DIAGNOSIS — N1831 Chronic kidney disease, stage 3a: Secondary | ICD-10-CM | POA: Diagnosis not present

## 2023-07-09 DIAGNOSIS — N2581 Secondary hyperparathyroidism of renal origin: Secondary | ICD-10-CM | POA: Diagnosis not present

## 2023-07-09 DIAGNOSIS — N189 Chronic kidney disease, unspecified: Secondary | ICD-10-CM | POA: Diagnosis not present

## 2023-07-09 DIAGNOSIS — E1122 Type 2 diabetes mellitus with diabetic chronic kidney disease: Secondary | ICD-10-CM | POA: Diagnosis not present

## 2023-07-23 ENCOUNTER — Telehealth: Payer: Self-pay | Admitting: Endocrinology

## 2023-07-23 NOTE — Telephone Encounter (Signed)
Patient called and said the [facility] pharmacy said they faxed over a refill request for Semaglutide (RYBELSUS) 14 MG TABS last week but hadnt heard anything back on it, Patient wanted to follow up. Callback if needed is [phone].

## 2023-07-24 ENCOUNTER — Other Ambulatory Visit: Payer: Self-pay

## 2023-07-24 DIAGNOSIS — E1169 Type 2 diabetes mellitus with other specified complication: Secondary | ICD-10-CM

## 2023-07-24 MED ORDER — RYBELSUS 14 MG PO TABS: ORAL_TABLET | ORAL | 3 refills | Status: DC

## 2023-07-24 NOTE — Telephone Encounter (Signed)
Patient called back to advise that he is now out of Rybelsus to requested refill to be sent - see previous message

## 2023-07-26 ENCOUNTER — Other Ambulatory Visit: Payer: Self-pay

## 2023-07-27 ENCOUNTER — Other Ambulatory Visit: Payer: Self-pay

## 2023-07-30 ENCOUNTER — Telehealth: Payer: Self-pay | Admitting: Physician Assistant

## 2023-07-30 MED ORDER — PLENVU 140 G PO SOLR: 1.0000 | ORAL | 0 refills | Status: DC

## 2023-07-30 NOTE — Telephone Encounter (Signed)
Patient called requested to speak with a nurse stated he is experiencing chronic constipation and would like to discuss a prep solution.

## 2023-07-30 NOTE — Telephone Encounter (Signed)
Called and spoke with patient. He reports that he passed a small amount of stool yesterday, but has not emptied completely in the last 5 days. Reports straining to have BM. Pt has continued Linzess 290 mcg and Miralax BID and it has not been effective. Pt reports that his bowels move slow, he has mild lower abdominal pain and some bloating. Pt requested a bowel prep, no samples in the office. I offered to give patient instructions for Miralax bowel purge, pt declined. Pt stated that he was willing to pay for prep and requested that it be sent to his pharmacy. Plenvu sent to pharmacy as requested since patient is looking for immediate relief. Pt reports that he is also still taking Rybelsus and PCP has not made any changes yet. Pt is aware that his medications may be changed since current regimen is not effective. Pt is scheduled for a follow up with Dr. Rhea Belton in April, but would like Jennifer's recommendation in the interim. Pt is aware that Victorino Dike is out of the office today and will advise upon her return.

## 2023-07-30 NOTE — Telephone Encounter (Signed)
Patient called with his insurance rep and they stated that for the medication PLENVU they would need a prior authorization and medical records on why other medication have not work for him. Patient and insurance rep provided [place] with a call number to the prior authorization team at the insurance office  [phone]. Please advise.

## 2023-07-31 MED ORDER — LUBIPROSTONE 24 MCG PO CAPS: 24.0000 ug | ORAL_CAPSULE | Freq: Two times a day (BID) | ORAL | 2 refills | Status: DC

## 2023-07-31 MED ORDER — PEG 3350-KCL-NA BICARB-NACL 420 G PO SOLR
4000.0000 mL | Freq: Once | ORAL | 0 refills | Status: AC
Start: 2023-07-31 — End: 2023-07-31

## 2023-07-31 NOTE — Telephone Encounter (Signed)
PT called very upset that no one has returned his call concerning a bowel purge. He wants an alternative for Plenvu sent immediately because he feels we are not concerned with how he feels. Plenvu needs to have a PA but he does not want to wait for it. I advised him Victorino Dike was not available and that we will reach out as soon as we have recommendations. Please advise.

## 2023-07-31 NOTE — Telephone Encounter (Signed)
Called and left patient a detailed vm with plan. I apologized to patient that he was not able to pick up prep that was prescribed yesterday, NULYTELY prep sent as an alternative. No prep samples available at this time. Pt was advised yesterday that Victorino Dike was out of the office yesterday and we would await her recommendation. I advised patient to STOP Linzess, begin Amitiza 24 mcg BID in addition to Miralax BID. I advised pt to keep follow up appt with Dr. Rhea Belton as scheduled in April. Advised patient to call back with any questions.

## 2023-07-31 NOTE — Telephone Encounter (Signed)
See telephone contact from 07/30/23.

## 2023-08-03 ENCOUNTER — Other Ambulatory Visit: Payer: Self-pay | Admitting: Internal Medicine

## 2023-08-04 ENCOUNTER — Other Ambulatory Visit: Payer: Self-pay | Admitting: Internal Medicine

## 2023-08-12 ENCOUNTER — Encounter: Payer: Self-pay | Admitting: Internal Medicine

## 2023-08-12 DIAGNOSIS — E538 Deficiency of other specified B group vitamins: Secondary | ICD-10-CM | POA: Insufficient documentation

## 2023-08-12 NOTE — Progress Notes (Signed)
 Subjective:    Patient ID: Noah Barnes, male    DOB: [birth year], 78 y.o.   MRN: [id number]     HPI Noah Barnes is here for follow up of his chronic medical problems.  Exercise is limited due to cold weather.  Overall feels good - except for the arthritis.   Medications and allergies reviewed with patient and updated if appropriate.  Current Outpatient Medications on File Prior to Visit  Medication Sig Dispense Refill   Accu-Chek Softclix Lancets lancets CHECK BLOOD SUGAR TWICE A DAY. 100 each 2   albuterol  (PROVENTIL ) (2.5 MG/3ML) 0.083% nebulizer solution Take 3 mLs (2.5 mg total) by nebulization every 6 (six) hours as needed for wheezing or shortness of breath. 75 mL 5   albuterol  (VENTOLIN  HFA) 108 (90 Base) MCG/ACT inhaler Inhale 2 puffs into the lungs every 6 (six) hours as needed for wheezing or shortness of breath. 8 g 0   allopurinol  (ZYLOPRIM ) 300 MG tablet Take 1 tablet by mouth once daily 90 tablet 0   atorvastatin  (LIPITOR) 20 MG tablet Take 1 tablet by mouth once daily 90 tablet 0   Blood Glucose Monitoring Suppl (ACCU-CHEK GUIDE ME) w/Device KIT 1 each by Does not apply route in the morning and at bedtime. Use Accu Chek Guide me device to check blood sugar twice daily. 1 kit 0   ELIQUIS  2.5 MG TABS tablet Take 1 tablet by mouth twice daily 180 tablet 0   FERREX 150 150 MG capsule Take 1 capsule by mouth once daily 90 capsule 0   fluticasone -salmeterol (ADVAIR DISKUS) 250-50 MCG/ACT AEPB Inhale 1 puff into the lungs in the morning and at bedtime. 60 each 5   furosemide  (LASIX ) 40 MG tablet Take 40 mg by mouth daily.     GAVILYTE-N  WITH FLAVOR PACK 420 g solution See admin instructions.     glimepiride  (AMARYL ) 1 MG tablet Take 1 tablet (1 mg total) by mouth daily with breakfast. 90 tablet 0   glucose blood (ACCU-CHEK GUIDE) test strip USE AS DIRECTED TO TEST BLOOD SUGAR TWICE DAILY 100 strip 3   hydrALAZINE  (APRESOLINE ) 50 MG tablet TAKE 1 TABLET BY MOUTH TWICE DAILY  **ANNUAL  APPOINTMENT  DUE  IN  [place].  MUST  SEE  PROVIDER  FOR  FUTURE  REFILLS** 180 tablet 2   lubiprostone  (AMITIZA ) 24 MCG capsule Take 1 capsule (24 mcg total) by mouth 2 (two) times daily with a meal. 60 capsule 2   Semaglutide  (RYBELSUS ) 14 MG TABS TAKE 1 TABLET BY MOUTH IN THE MORNING 30 MINUTES BEFORE BREAKFAST WITH SIP OF WATER 30 tablet 3   sildenafil  (VIAGRA ) 100 MG tablet TAKE 1 TABLET BY MOUTH EVERY DAY AS NEEDED FOR ERECTILE DYSFUNCTION 6 tablet 1   tamsulosin  (FLOMAX ) 0.4 MG CAPS capsule TAKE 1 CAPSULE BY MOUTH ONCE DAILY AFTER SUPPER 90 capsule 0   Tiotropium Bromide Monohydrate  (SPIRIVA  RESPIMAT) 2.5 MCG/ACT AERS Inhale 2 puffs into the lungs daily. 4 g 11   Vitamin D, Ergocalciferol, (DRISDOL) 1.25 MG (50000 UNIT) CAPS capsule Take 50,000 Units by mouth once a week.     No current facility-administered medications on file prior to visit.     Review of Systems  Constitutional:  Negative for fever.  Respiratory:  Negative for cough, shortness of breath and wheezing.   Cardiovascular:  Positive for leg swelling (mild at times). Negative for chest pain and palpitations.  Neurological:  Negative for light-headedness and headaches.  Objective:   Vitals:   08/13/23 0825  BP: 136/78  Pulse: 90  Temp: 98 F (36.7 C)  SpO2: 98%   BP Readings from Last 3 Encounters:  08/13/23 136/78  06/25/23 (!) 142/64  05/18/23 (!) 146/80   Wt Readings from Last 3 Encounters:  08/13/23 217 lb (98.4 kg)  06/25/23 215 lb (97.5 kg)  05/18/23 221 lb 9.6 oz (100.5 kg)   Body mass index is 33.49 kg/m.    Physical Exam Constitutional:      General: He is not in acute distress.    Appearance: Normal appearance. He is not ill-appearing.  HENT:     Head: Normocephalic and atraumatic.  Eyes:     Conjunctiva/sclera: Conjunctivae normal.  Cardiovascular:     Rate and Rhythm: Normal rate and regular rhythm.     Heart sounds: Normal heart sounds.  Pulmonary:     Effort:  Pulmonary effort is normal. No respiratory distress.     Breath sounds: Normal breath sounds. No wheezing or rales.  Musculoskeletal:     Right lower leg: Edema (1+ ankle) present.     Left lower leg: Edema (1+ ankle) present.  Skin:    General: Skin is warm and dry.     Findings: No rash.  Neurological:     Mental Status: He is alert. Mental status is at baseline.  Psychiatric:        Mood and Affect: Mood normal.        Lab Results  Component Value Date   WBC 4.6 04/04/2023   HGB 13.7 04/04/2023   HCT 42.8 04/04/2023   PLT 204 04/04/2023   GLUCOSE 103 (H) 05/14/2023   CHOL 142 08/11/2022   TRIG 66.0 08/11/2022   HDL 50.40 08/11/2022   LDLDIRECT 80.1 04/15/2014   LDLCALC 78 08/11/2022   ALT 56 (H) 05/14/2023   AST 19 05/14/2023   NA 139 05/14/2023   K 4.1 05/14/2023   CL 114 (H) 05/14/2023   CREATININE 1.58 (H) 05/14/2023   BUN 30 (H) 05/14/2023   CO2 17 (L) 05/14/2023   TSH 2.07 12/28/2020   PSA 0.70 12/18/2016   INR 1.2 03/26/2019   HGBA1C 6.1 05/14/2023   MICROALBUR 4.3 (H) 05/14/2023     Assessment & Plan:    See Problem List for Assessment and Plan of chronic medical problems.

## 2023-08-12 NOTE — Patient Instructions (Addendum)
      Blood work was ordered.       Medications changes include :   None     Return in about 6 months (around 02/10/2024) for Physical Exam.

## 2023-08-13 ENCOUNTER — Ambulatory Visit: Payer: Medicare PPO | Admitting: Internal Medicine

## 2023-08-13 VITALS — BP 136/78 | HR 90 | Temp 98.0°F | Ht 67.5 in | Wt 217.0 lb

## 2023-08-13 DIAGNOSIS — I10 Essential (primary) hypertension: Secondary | ICD-10-CM

## 2023-08-13 DIAGNOSIS — N1831 Chronic kidney disease, stage 3a: Secondary | ICD-10-CM

## 2023-08-13 DIAGNOSIS — M109 Gout, unspecified: Secondary | ICD-10-CM

## 2023-08-13 DIAGNOSIS — E1129 Type 2 diabetes mellitus with other diabetic kidney complication: Secondary | ICD-10-CM | POA: Diagnosis not present

## 2023-08-13 DIAGNOSIS — Z86718 Personal history of other venous thrombosis and embolism: Secondary | ICD-10-CM | POA: Diagnosis not present

## 2023-08-13 DIAGNOSIS — E782 Mixed hyperlipidemia: Secondary | ICD-10-CM | POA: Diagnosis not present

## 2023-08-13 DIAGNOSIS — N529 Male erectile dysfunction, unspecified: Secondary | ICD-10-CM | POA: Diagnosis not present

## 2023-08-13 DIAGNOSIS — N401 Enlarged prostate with lower urinary tract symptoms: Secondary | ICD-10-CM | POA: Diagnosis not present

## 2023-08-13 DIAGNOSIS — Z7984 Long term (current) use of oral hypoglycemic drugs: Secondary | ICD-10-CM

## 2023-08-13 DIAGNOSIS — E538 Deficiency of other specified B group vitamins: Secondary | ICD-10-CM

## 2023-08-13 LAB — VITAMIN B12: Vitamin B-12: 328 pg/mL (ref 211–911)

## 2023-08-13 LAB — COMPREHENSIVE METABOLIC PANEL
ALT: 44 U/L (ref 0–53)
AST: 19 U/L (ref 0–37)
Albumin: 4.1 g/dL (ref 3.5–5.2)
Alkaline Phosphatase: 75 U/L (ref 39–117)
BUN: 36 mg/dL — ABNORMAL HIGH (ref 6–23)
CO2: 16 meq/L — ABNORMAL LOW (ref 19–32)
Calcium: 9.2 mg/dL (ref 8.4–10.5)
Chloride: 112 meq/L (ref 96–112)
Creatinine, Ser: 1.6 mg/dL — ABNORMAL HIGH (ref 0.40–1.50)
GFR: 41.57 mL/min — ABNORMAL LOW (ref >60.00)
Glucose, Bld: 127 mg/dL — ABNORMAL HIGH (ref 70–99)
Potassium: 4 meq/L (ref 3.5–5.1)
Sodium: 139 meq/L (ref 135–145)
Total Bilirubin: 0.7 mg/dL (ref 0.2–1.2)
Total Protein: 6.9 g/dL (ref 6.0–8.3)

## 2023-08-13 LAB — CBC WITH DIFFERENTIAL/PLATELET
Basophils Absolute: 0 10*3/uL (ref 0.0–0.1)
Basophils Relative: 0.5 % (ref 0.0–3.0)
Eosinophils Absolute: 0.1 10*3/uL (ref 0.0–0.7)
Eosinophils Relative: 2.8 % (ref 0.0–5.0)
HCT: 42.7 % (ref 39.0–52.0)
Hemoglobin: 13.6 g/dL (ref 13.0–17.0)
Lymphocytes Relative: 33.9 % (ref 12.0–46.0)
Lymphs Abs: 1.3 10*3/uL (ref 0.7–4.0)
MCHC: 31.7 g/dL (ref 30.0–36.0)
MCV: 85.4 fL (ref 78.0–100.0)
Monocytes Absolute: 0.4 10*3/uL (ref 0.1–1.0)
Monocytes Relative: 11 % (ref 3.0–12.0)
Neutro Abs: 2 10*3/uL (ref 1.4–7.7)
Neutrophils Relative %: 51.8 % (ref 43.0–77.0)
Platelets: 189 10*3/uL (ref 150.0–400.0)
RBC: 5 Mil/uL (ref 4.22–5.81)
RDW: 15.5 % (ref 11.5–15.5)
WBC: 3.8 10*3/uL — ABNORMAL LOW (ref 4.0–10.5)

## 2023-08-13 LAB — LIPID PANEL
Cholesterol: 126 mg/dL (ref 0–200)
HDL: 53.1 mg/dL (ref >39.00)
LDL Cholesterol: 63 mg/dL (ref 0–99)
NonHDL: 73.19
Total CHOL/HDL Ratio: 2
Triglycerides: 51 mg/dL (ref 0.0–149.0)
VLDL: 10.2 mg/dL (ref 0.0–40.0)

## 2023-08-13 LAB — URIC ACID: Uric Acid, Serum: 4.4 mg/dL (ref 4.0–7.8)

## 2023-08-13 MED ORDER — METOPROLOL TARTRATE 100 MG PO TABS: 100.0000 mg | ORAL_TABLET | Freq: Two times a day (BID) | ORAL | 1 refills | Status: DC

## 2023-08-13 NOTE — Assessment & Plan Note (Signed)
Chronic On lifelong anticoagulation for prevention Eliquis 2.5 mg twice daily CBC, CMP

## 2023-08-13 NOTE — Assessment & Plan Note (Signed)
 Chronic With CKD  Lab Results  Component Value Date   HGBA1C 6.1 05/14/2023   Sugars well controlled Management per endocrine-on glimepiride  1 mg daily, Rybelsus  14 mg daily Stressed regular exercise, diabetic diet

## 2023-08-13 NOTE — Assessment & Plan Note (Signed)
Chronic °Regular exercise and healthy diet encouraged °Check lipid panel  °Continue atorvastatin 20 mg daily °

## 2023-08-13 NOTE — Assessment & Plan Note (Signed)
Chronic Taking B12 Check B12 level

## 2023-08-13 NOTE — Assessment & Plan Note (Addendum)
 Chronic Controlled- denies gout flares Continue allopurinol  300 mg daily Check uric acid level

## 2023-08-13 NOTE — Assessment & Plan Note (Signed)
 Chronic Blood pressure well controlled CBC, cmp Continue hydralazine  50 mg twice daily, metoprolol  100 mg twice daily

## 2023-08-13 NOTE — Assessment & Plan Note (Signed)
Chronic Controlled Continue Flomax 0.4 mg daily

## 2023-08-13 NOTE — Assessment & Plan Note (Signed)
Chronic Sildenafil prn

## 2023-08-13 NOTE — Assessment & Plan Note (Signed)
 Chronic Following with nephrology Stable CBC, CMP

## 2023-08-14 ENCOUNTER — Encounter: Payer: Self-pay | Admitting: Internal Medicine

## 2023-08-28 ENCOUNTER — Other Ambulatory Visit: Payer: Self-pay | Admitting: Internal Medicine

## 2023-08-31 ENCOUNTER — Other Ambulatory Visit: Payer: Self-pay | Admitting: Physician Assistant

## 2023-09-03 ENCOUNTER — Ambulatory Visit: Admitting: Family Medicine

## 2023-09-03 ENCOUNTER — Encounter: Payer: Self-pay | Admitting: Family Medicine

## 2023-09-03 ENCOUNTER — Ambulatory Visit: Payer: Self-pay | Admitting: Internal Medicine

## 2023-09-03 VITALS — BP 140/88 | HR 56 | Temp 98.0°F | Ht 67.5 in | Wt 218.8 lb

## 2023-09-03 DIAGNOSIS — R109 Unspecified abdominal pain: Secondary | ICD-10-CM

## 2023-09-03 DIAGNOSIS — Z87442 Personal history of urinary calculi: Secondary | ICD-10-CM

## 2023-09-03 DIAGNOSIS — M545 Low back pain, unspecified: Secondary | ICD-10-CM | POA: Diagnosis not present

## 2023-09-03 DIAGNOSIS — R1032 Left lower quadrant pain: Secondary | ICD-10-CM

## 2023-09-03 LAB — POCT URINALYSIS DIPSTICK
Bilirubin, UA: NEGATIVE
Blood, UA: NEGATIVE
Glucose, UA: NEGATIVE
Ketones, UA: NEGATIVE
Leukocytes, UA: NEGATIVE
Nitrite, UA: NEGATIVE
Protein, UA: POSITIVE — AB
Spec Grav, UA: 1.025 (ref 1.010–1.025)
Urobilinogen, UA: 0.2 U/dL
pH, UA: 5.5 (ref 5.0–8.0)

## 2023-09-03 MED ORDER — TIZANIDINE HCL 4 MG PO TABS: 4.0000 mg | ORAL_TABLET | Freq: Four times a day (QID) | ORAL | 0 refills | Status: AC | PRN

## 2023-09-03 NOTE — Patient Instructions (Signed)
 Continue Flomax  I have sent in a muscle relaxer for you to use one tablet as needed for muscle spasms. This medication can make you sleepy. Do not drive until you know how this medication affects you.  I have ordered a CT renal stone study, someone will be reaching out to get you scheduled and we will be in contact with results.  Your urine is not concerning for infection today, there was some protein present.  Follow-up with me for new or worsening symptoms.

## 2023-09-03 NOTE — Telephone Encounter (Signed)
 Communication  Red Word that prompted transfer to Nurse Triage: Right side pain lower abdomen. Hasn't slept in two or three days   Chief Complaint: Right lower abdominal pain, radiates into back Symptoms: Above Frequency: Friday Pertinent Negatives: Patient denies fever Disposition: [] ED /[] Urgent Care (no appt availability in office) / [x] Appointment(In office/virtual)/ []  [facility]/ [] Home Care/ [] Refused Recommended Disposition /[] [facility]/ []  Follow-up with PCP Additional Notes: Instructed to call back for worsening symptoms. Agrees with appointment.  Reason for Disposition  [1] MILD-MODERATE pain AND [2] constant AND [3] present > 2 hours  Answer Assessment - Initial Assessment Questions 1. LOCATION: "Where does it hurt?"      Right lower  2. RADIATION: "Does the pain shoot anywhere else?" (e.g., chest, back)     Yes 3. ONSET: "When did the pain begin?" (Minutes, hours or days ago)      Friday 4. SUDDEN: "Gradual or sudden onset?"     Gradual 5. PATTERN "Does the pain come and go, or is it constant?"    - If it comes and goes: "How long does it last?" "Do you have pain now?"     (Note: Comes and goes means the pain is intermittent. It goes away completely between bouts.)    - If constant: "Is it getting better, staying the same, or getting worse?"      (Note: Constant means the pain never goes away completely; most serious pain is constant and gets worse.)      Constant 6. SEVERITY: "How bad is the pain?"  (e.g., Scale 1-10; mild, moderate, or severe)    - MILD (1-3): Doesn't interfere with normal activities, abdomen soft and not tender to touch.     - MODERATE (4-7): Interferes with normal activities or awakens from sleep, abdomen tender to touch.     - SEVERE (8-10): Excruciating pain, doubled over, unable to do any normal activities.       Now - 6-7 7. RECURRENT SYMPTOM: "Have you ever had this type of stomach pain before?" If Yes, ask: "When was  the last time?" and "What happened that time?"      No 8. CAUSE: "What do you think is causing the stomach pain?"     Unsure 9. RELIEVING/AGGRAVATING FACTORS: "What makes it better or worse?" (e.g., antacids, bending or twisting motion, bowel movement)     No 10. OTHER SYMPTOMS: "Do you have any other symptoms?" (e.g., back pain, diarrhea, fever, urination pain, vomiting)       No  Protocols used: Abdominal Pain - Male-A-AH

## 2023-09-03 NOTE — Progress Notes (Signed)
 Acute Office Visit  Subjective:     Patient ID: Noah Barnes, male    DOB: [birth year], 78 y.o.   MRN: [id number]  Chief Complaint  Patient presents with   Acute Visit    Ongoing since Thursday, left side abdominal pain    HPI Patient is in today for evaluation of left low back pain that radiates around the left flank. Describes pain as deep and achy Worse with activity. Hx renal calculi. Denies gross hematuria, dysuria, urinary urgency, frequency, chills, fever, rash, other symptoms.   ROS Per HPI      Objective:    BP (!) 140/88 (BP Location: Left Arm, Patient Position: Sitting)   Pulse (!) 56   Temp 98 F (36.7 C) (Temporal)   Ht 5' 7.5" (1.715 m)   Wt 218 lb 12.8 oz (99.2 kg)   SpO2 97%   BMI 33.76 kg/m    Physical Exam Vitals and nursing note reviewed.  Constitutional:      General: He is not in acute distress.    Appearance: Normal appearance.  HENT:     Head: Normocephalic and atraumatic.  Eyes:     Extraocular Movements: Extraocular movements intact.  Cardiovascular:     Rate and Rhythm: Normal rate and regular rhythm.  Pulmonary:     Effort: Pulmonary effort is normal.  Abdominal:     General: There is no distension.     Palpations: There is no mass.     Tenderness: There is no abdominal tenderness.     Hernia: A hernia (large ventral hernia, non tender, reducible) is present.  Musculoskeletal:     Cervical back: Normal range of motion.     Comments: LROM with position changes, holding L low back when going from sitting to standing. No obvious swelling, bruising, heat, rash, deformity noted  Skin:    General: Skin is warm and dry.  Neurological:     General: No focal deficit present.     Mental Status: He is alert and oriented to person, place, and time.  Psychiatric:        Mood and Affect: Mood normal.        Behavior: Behavior normal.    Results for orders placed or performed in visit on 09/03/23  POCT urinalysis dipstick  Result  Value Ref Range   Color, UA yellow    Clarity, UA clear    Glucose, UA Negative Negative   Bilirubin, UA negative    Ketones, UA negative    Spec Grav, UA 1.025 1.010 - 1.025   Blood, UA negative    pH, UA 5.5 5.0 - 8.0   Protein, UA Positive (A) Negative   Urobilinogen, UA 0.2 0.2 or 1.0 E.U./dL   Nitrite, UA negative    Leukocytes, UA Negative Negative   Appearance clear    Odor          Assessment & Plan:  1. Acute left-sided low back pain without sciatica (Primary)  - tiZANidine (ZANAFLEX) 4 MG tablet; Take 1 tablet (4 mg total) by mouth every 6 (six) hours as needed for muscle spasms.  Dispense: 30 tablet; Refill: 0  2. Left flank pain  - POCT urinalysis dipstick - tiZANidine (ZANAFLEX) 4 MG tablet; Take 1 tablet (4 mg total) by mouth every 6 (six) hours as needed for muscle spasms.  Dispense: 30 tablet; Refill: 0  3. History of renal calculi  - POCT urinalysis dipstick  4. Left lower quadrant abdominal pain  -  CT RENAL STONE STUDY; Future   Meds ordered this encounter  Medications   tiZANidine (ZANAFLEX) 4 MG tablet    Sig: Take 1 tablet (4 mg total) by mouth every 6 (six) hours as needed for muscle spasms.    Dispense:  30 tablet    Refill:  0    Return if symptoms worsen or fail to improve.  Moshe Cipro, FNP

## 2023-09-04 ENCOUNTER — Other Ambulatory Visit: Payer: Self-pay | Admitting: Internal Medicine

## 2023-09-04 ENCOUNTER — Ambulatory Visit
Admission: RE | Admit: 2023-09-04 | Discharge: 2023-09-04 | Disposition: A | Discharge status: Home / Self Care | Source: Ambulatory Visit | Attending: Family Medicine | Admitting: Family Medicine

## 2023-09-04 DIAGNOSIS — N200 Calculus of kidney: Secondary | ICD-10-CM | POA: Diagnosis not present

## 2023-09-04 DIAGNOSIS — R1032 Left lower quadrant pain: Secondary | ICD-10-CM

## 2023-09-04 DIAGNOSIS — R109 Unspecified abdominal pain: Secondary | ICD-10-CM | POA: Diagnosis not present

## 2023-09-04 DIAGNOSIS — K5730 Diverticulosis of large intestine without perforation or abscess without bleeding: Secondary | ICD-10-CM | POA: Diagnosis not present

## 2023-09-04 MED ORDER — LINACLOTIDE 290 MCG PO CAPS: 290.0000 ug | ORAL_CAPSULE | Freq: Every day | ORAL | 3 refills | Status: AC

## 2023-09-04 NOTE — Telephone Encounter (Signed)
 Inbound call from patient requesting a refill for Lizness. Please advise, thank you

## 2023-09-10 ENCOUNTER — Other Ambulatory Visit: Payer: Medicare PPO

## 2023-09-11 ENCOUNTER — Encounter: Payer: Self-pay | Admitting: Internal Medicine

## 2023-09-11 NOTE — Progress Notes (Unsigned)
 Subjective:    Patient ID: Noah Barnes, male    DOB: 1946-01-22, 78 y.o.   MRN: [id number]      HPI Noah Barnes is here for No chief complaint on file.   Referral to neurology-     Medications and allergies reviewed with patient and updated if appropriate.  Current Outpatient Medications on File Prior to Visit  Medication Sig Dispense Refill   Accu-Chek Softclix Lancets lancets CHECK BLOOD SUGAR TWICE A DAY. 100 each 2   albuterol (PROVENTIL) (2.5 MG/3ML) 0.083% nebulizer solution Take 3 mLs (2.5 mg total) by nebulization every 6 (six) hours as needed for wheezing or shortness of breath. 75 mL 5   albuterol (VENTOLIN HFA) 108 (90 Base) MCG/ACT inhaler Inhale 2 puffs into the lungs every 6 (six) hours as needed for wheezing or shortness of breath. 8 g 0   allopurinol (ZYLOPRIM) 300 MG tablet Take 1 tablet by mouth once daily 90 tablet 0   atorvastatin (LIPITOR) 20 MG tablet Take 1 tablet by mouth once daily 90 tablet 0   Blood Glucose Monitoring Suppl (ACCU-CHEK GUIDE ME) w/Device KIT 1 each by Does not apply route in the morning and at bedtime. Use Accu Chek Guide me device to check blood sugar twice daily. 1 kit 0   ELIQUIS 2.5 MG TABS tablet Take 1 tablet by mouth twice daily 180 tablet 0   FERREX 150 150 MG capsule Take 1 capsule by mouth once daily 90 capsule 0   fluticasone-salmeterol (ADVAIR DISKUS) 250-50 MCG/ACT AEPB Inhale 1 puff into the lungs in the morning and at bedtime. 60 each 5   furosemide (LASIX) 40 MG tablet Take 40 mg by mouth daily.     GAVILYTE-N WITH FLAVOR PACK 420 g solution See admin instructions.     glimepiride (AMARYL) 1 MG tablet Take 1 tablet (1 mg total) by mouth daily with breakfast. 90 tablet 0   glucose blood (ACCU-CHEK GUIDE) test strip USE AS DIRECTED TO TEST BLOOD SUGAR TWICE DAILY 100 strip 3   hydrALAZINE (APRESOLINE) 50 MG tablet TAKE 1 TABLET BY MOUTH TWICE DAILY **ANNUAL  APPOINTMENT  DUE  IN  [place].  MUST  SEE  PROVIDER  FOR  FUTURE   REFILLS** 180 tablet 2   linaclotide (LINZESS) 290 MCG CAPS capsule Take 1 capsule (290 mcg total) by mouth daily before breakfast. 90 capsule 3   lubiprostone (AMITIZA) 24 MCG capsule Take 1 capsule (24 mcg total) by mouth 2 (two) times daily with a meal. 60 capsule 2   metoprolol tartrate (LOPRESSOR) 100 MG tablet Take 1 tablet (100 mg total) by mouth 2 (two) times daily. 180 tablet 1   Semaglutide (RYBELSUS) 14 MG TABS TAKE 1 TABLET BY MOUTH IN THE MORNING 30 MINUTES BEFORE BREAKFAST WITH SIP OF WATER 30 tablet 3   sildenafil (VIAGRA) 100 MG tablet TAKE 1 TABLET BY MOUTH EVERY DAY AS NEEDED FOR ERECTILE DYSFUNCTION 6 tablet 1   tamsulosin (FLOMAX) 0.4 MG CAPS capsule TAKE 1 CAPSULE BY MOUTH ONCE DAILY AFTER SUPPER 90 capsule 0   Tiotropium Bromide Monohydrate (SPIRIVA RESPIMAT) 2.5 MCG/ACT AERS Inhale 2 puffs into the lungs daily. 4 g 11   tiZANidine (ZANAFLEX) 4 MG tablet Take 1 tablet (4 mg total) by mouth every 6 (six) hours as needed for muscle spasms. 30 tablet 0   Vitamin D, Ergocalciferol, (DRISDOL) 1.25 MG (50000 UNIT) CAPS capsule Take 50,000 Units by mouth once a week.     No current facility-administered  medications on file prior to visit.    Review of Systems     Objective:  There were no vitals filed for this visit. BP Readings from Last 3 Encounters:  09/03/23 (!) 140/88  08/13/23 136/78  06/25/23 (!) 142/64   Wt Readings from Last 3 Encounters:  09/03/23 218 lb 12.8 oz (99.2 kg)  08/13/23 217 lb (98.4 kg)  06/25/23 215 lb (97.5 kg)   There is no height or weight on file to calculate BMI.    Physical Exam         Assessment & Plan:    See Problem List for Assessment and Plan of chronic medical problems.

## 2023-09-12 ENCOUNTER — Ambulatory Visit: Admitting: Internal Medicine

## 2023-09-12 VITALS — BP 130/80 | HR 69 | Temp 98.3°F | Ht 67.5 in | Wt 222.0 lb

## 2023-09-12 DIAGNOSIS — I10 Essential (primary) hypertension: Secondary | ICD-10-CM | POA: Diagnosis not present

## 2023-09-12 DIAGNOSIS — M545 Low back pain, unspecified: Secondary | ICD-10-CM | POA: Diagnosis not present

## 2023-09-12 NOTE — Patient Instructions (Addendum)
       Medications changes include :   None        Return if symptoms worsen or fail to improve.

## 2023-09-12 NOTE — Assessment & Plan Note (Signed)
Chronic Blood pressure well controlled Continue hydralazine 50 mg twice daily, metoprolol 100 mg twice daily

## 2023-09-12 NOTE — Assessment & Plan Note (Signed)
 Acute Was seen 3/3 ?  Renal stone versus MSK CT renal showed 4 mm stone in lower pole of right kidney-no other stones Has increased his water intake Pain is better, but still has pain specially when he gets out of bed in the morning Pain likely musculoskeletal Continue tizanidine as needed Discussed with him that the stone in his right kidney is not causing any pain.  He may have passed a stone, but that seems less likely since there is no evidence of other stone on the CT scan and he is still having some mild pain No need to see urology at this time, but if pain recurs or if he wants to see urology he will let me know and I will refer him

## 2023-09-14 ENCOUNTER — Encounter: Payer: Self-pay | Admitting: Endocrinology

## 2023-09-14 ENCOUNTER — Ambulatory Visit: Payer: Medicare PPO | Admitting: Endocrinology

## 2023-09-14 VITALS — BP 132/80 | HR 60 | Resp 20 | Ht 67.5 in | Wt 221.6 lb

## 2023-09-14 DIAGNOSIS — Z7984 Long term (current) use of oral hypoglycemic drugs: Secondary | ICD-10-CM | POA: Diagnosis not present

## 2023-09-14 DIAGNOSIS — E1165 Type 2 diabetes mellitus with hyperglycemia: Secondary | ICD-10-CM

## 2023-09-14 LAB — POCT GLYCOSYLATED HEMOGLOBIN (HGB A1C): Hemoglobin A1C: 5.7 % — AB (ref 4.0–5.6)

## 2023-09-14 NOTE — Progress Notes (Signed)
 Outpatient Endocrinology Note Noah Gordon Vandunk, MD  09/14/23  Patient's Name: Noah Barnes    DOB: [birth year]    MRN: [id number]                                                    REASON OF VISIT: Follow up of type 2 diabetes mellitus  PCP: Pincus Sanes, MD  HISTORY OF PRESENT ILLNESS:   Noah Barnes is a 78 y.o. old male with past medical history listed below, is here for follow up for type 2 diabetes mellitus.  Patient was previously seen by Dr. Lucianne Muss in July 2024.  Pertinent Diabetes History: Patient was diagnosed with type 2 diabetes mellitus in 2008.  Patient has controlled type 2 diabetes mellitus.  Chronic Diabetes Complications : Retinopathy: no. Last ophthalmology exam was done on 01/2023, following with ophthalmology regularly.  Nephropathy: CKD III, following with nephrology. Peripheral neuropathy: no Coronary artery disease: no Stroke: on  Relevant comorbidities and cardiovascular risk factors: Obesity: yes Body mass index is 34.2 kg/m.  Hypertension: Yes  Hyperlipidemia : Yes, on statin   Current / Home Diabetic regimen includes: Rybelsus 14 mg daily. Amaryl 1 mg daily with supper.  Prior diabetic medications: Metformin, Janumet, Invokana in the past.  Glycemic data:    Accu-Chek guide glucometer, forget to bring today. By recall: 114 fasting, bedtime 107.  Hypoglycemia: Patient has no hypoglycemic episodes. Patient has hypoglycemia awareness.  Factors modifying glucose control: 1.  Diabetic diet assessment: 3 meals a day.  2.  Staying active or exercising: No formal exercise.  3.  Medication compliance: compliant all of the time.  Interval history  No glucometer today. Reports normal glucose.  Diabetes regimen as noted above.  He has no complaints today.  Denies hypoglycemic symptoms.  REVIEW OF SYSTEMS As per history of present illness.   PAST MEDICAL HISTORY: Past Medical History:  Diagnosis Date   CHF (congestive heart failure) (HCC)     Chronic diastolic heart failure (HCC)    Constipation    Diabetes mellitus    Diverticulitis    Diverticulosis    ED (erectile dysfunction)    Gout    Heart murmur    Hyperlipidemia    Hypertension    IBS (irritable bowel syndrome)    Renal calculus    Renal insufficiency    Tubular adenoma of colon     PAST SURGICAL HISTORY: Past Surgical History:  Procedure Laterality Date   COLONOSCOPY  1998   negative; no F/U   EXTRACORPOREAL SHOCK WAVE LITHOTRIPSY Left 03/20/2019   Procedure: EXTRACORPOREAL SHOCK WAVE LITHOTRIPSY (ESWL);  Surgeon: Malen Gauze, MD;  Location: [facility];  Service: Urology;  Laterality: Left;   knee effusion tapped      ALLERGIES: Allergies  Allergen Reactions   Ace Inhibitors Other (See Comments)    Severe AKI due to ARB + dehydration Aug 2016   Angiotensin Receptor Blockers Other (See Comments)    Severe AKI due to ARB + dehydration Aug 2016    FAMILY HISTORY:  Family History  Problem Relation Age of Onset   Heart attack Mother 77   Hypertension Mother    Prostate cancer Father 33   Diabetes Father    Irritable bowel syndrome Father    Irritable bowel syndrome Sister  x 2   Colon cancer Brother        dx in his late 53's   Heart disease Brother    Irritable bowel syndrome Brother    Stroke Neg Hx    Esophageal cancer Neg Hx    Rectal cancer Neg Hx    Stomach cancer Neg Hx     SOCIAL HISTORY: Social History   Socioeconomic History   Marital status: Married    Spouse name: Not on file   Number of children: 1   Years of education: Not on file   Highest education level: Not on file  Occupational History   Occupation: Psychologist, counselling: [employer]    Comment: [phone]  Tobacco Use   Smoking status: Former    Current packs/day: 0.00    Average packs/day: 0.3 packs/day for 23.0 years (6.9 ttl pk-yrs)    Types: Cigarettes    Start date: 07/03/1968    Quit date: 07/04/1991    Years since  quitting: 32.2   Smokeless tobacco: Never   Tobacco comments:    smoked age [age], up to 1/3 ppd; may be less  Vaping Use   Vaping status: Never Used  Substance and Sexual Activity   Alcohol use: Not Currently    Comment: rarely   Drug use: No   Sexual activity: Yes  Other Topics Concern   Not on file  Social History Narrative   ** Merged History Encounter **       Social Drivers of Health   Financial Resource Strain: Low Risk  (12/26/2022)   Overall Financial Resource Strain (CARDIA)    Difficulty of Paying Living Expenses: Not hard at all  Food Insecurity: No Food Insecurity (12/26/2022)   Hunger Vital Sign    Worried About Running Out of Food in the Last Year: Never true    Ran Out of Food in the Last Year: Never true  Transportation Needs: No Transportation Needs (12/26/2022)   PRAPARE - Administrator, Civil Service (Medical): No    Lack of Transportation (Non-Medical): No  Physical Activity: Insufficiently Active (12/26/2022)   Exercise Vital Sign    Days of Exercise per Week: 3 days    Minutes of Exercise per Session: 20 min  Stress: No Stress Concern Present (12/26/2022)   [employer] of Occupational Health - Occupational Stress Questionnaire    Feeling of Stress : Not at all  Social Connections: Socially Integrated (12/26/2022)   Social Connection and Isolation Panel [NHANES]    Frequency of Communication with Friends and Family: More than three times a week    Frequency of Social Gatherings with Friends and Family: More than three times a week    Attends Religious Services: More than 4 times per year    Active Member of [employer] or Organizations: Yes    Attends Engineer, structural: More than 4 times per year    Marital Status: Married    MEDICATIONS:  Current Outpatient Medications  Medication Sig Dispense Refill   Accu-Chek Softclix Lancets lancets CHECK BLOOD SUGAR TWICE A DAY. 100 each 2   albuterol (PROVENTIL) (2.5 MG/3ML) 0.083%  nebulizer solution Take 3 mLs (2.5 mg total) by nebulization every 6 (six) hours as needed for wheezing or shortness of breath. 75 mL 5   albuterol (VENTOLIN HFA) 108 (90 Base) MCG/ACT inhaler Inhale 2 puffs into the lungs every 6 (six) hours as needed for wheezing or shortness of breath.  8 g 0   allopurinol (ZYLOPRIM) 300 MG tablet Take 1 tablet by mouth once daily 90 tablet 0   atorvastatin (LIPITOR) 20 MG tablet Take 1 tablet by mouth once daily 90 tablet 0   Blood Glucose Monitoring Suppl (ACCU-CHEK GUIDE ME) w/Device KIT 1 each by Does not apply route in the morning and at bedtime. Use Accu Chek Guide me device to check blood sugar twice daily. 1 kit 0   ELIQUIS 2.5 MG TABS tablet Take 1 tablet by mouth twice daily 180 tablet 0   FERREX 150 150 MG capsule Take 1 capsule by mouth once daily 90 capsule 0   fluticasone-salmeterol (ADVAIR DISKUS) 250-50 MCG/ACT AEPB Inhale 1 puff into the lungs in the morning and at bedtime. 60 each 5   furosemide (LASIX) 40 MG tablet Take 40 mg by mouth daily.     GAVILYTE-N WITH FLAVOR PACK 420 g solution See admin instructions.     glimepiride (AMARYL) 1 MG tablet Take 1 tablet (1 mg total) by mouth daily with breakfast. 90 tablet 0   glucose blood (ACCU-CHEK GUIDE) test strip USE AS DIRECTED TO TEST BLOOD SUGAR TWICE DAILY 100 strip 3   hydrALAZINE (APRESOLINE) 50 MG tablet TAKE 1 TABLET BY MOUTH TWICE DAILY **ANNUAL  APPOINTMENT  DUE  IN  [place].  MUST  SEE  PROVIDER  FOR  FUTURE  REFILLS** 180 tablet 2   linaclotide (LINZESS) 290 MCG CAPS capsule Take 1 capsule (290 mcg total) by mouth daily before breakfast. 90 capsule 3   lubiprostone (AMITIZA) 24 MCG capsule Take 1 capsule (24 mcg total) by mouth 2 (two) times daily with a meal. 60 capsule 2   metoprolol tartrate (LOPRESSOR) 100 MG tablet Take 1 tablet (100 mg total) by mouth 2 (two) times daily. 180 tablet 1   Semaglutide (RYBELSUS) 14 MG TABS TAKE 1 TABLET BY MOUTH IN THE MORNING 30 MINUTES BEFORE  BREAKFAST WITH SIP OF WATER 30 tablet 3   sildenafil (VIAGRA) 100 MG tablet TAKE 1 TABLET BY MOUTH EVERY DAY AS NEEDED FOR ERECTILE DYSFUNCTION 6 tablet 1   tamsulosin (FLOMAX) 0.4 MG CAPS capsule TAKE 1 CAPSULE BY MOUTH ONCE DAILY AFTER SUPPER 90 capsule 0   Tiotropium Bromide Monohydrate (SPIRIVA RESPIMAT) 2.5 MCG/ACT AERS Inhale 2 puffs into the lungs daily. 4 g 11   tiZANidine (ZANAFLEX) 4 MG tablet Take 1 tablet (4 mg total) by mouth every 6 (six) hours as needed for muscle spasms. 30 tablet 0   Vitamin D, Ergocalciferol, (DRISDOL) 1.25 MG (50000 UNIT) CAPS capsule Take 50,000 Units by mouth once a week.     No current facility-administered medications for this visit.    PHYSICAL EXAM: Vitals:   09/14/23 0902  BP: 132/80  Pulse: 60  Resp: 20  SpO2: 95%  Weight: 221 lb 9.6 oz (100.5 kg)  Height: 5' 7.5" (1.715 m)    Body mass index is 34.2 kg/m.  Wt Readings from Last 3 Encounters:  09/14/23 221 lb 9.6 oz (100.5 kg)  09/12/23 222 lb (100.7 kg)  09/03/23 218 lb 12.8 oz (99.2 kg)    General: Well developed, well nourished male in no apparent distress.  HEENT: AT/[place], no external lesions.  Eyes: Conjunctiva clear and no icterus. Neck: Neck supple  Lungs: Respirations not labored Neurologic: Alert, oriented, normal speech Extremities / Skin: Dry.  Psychiatric: Does not appear depressed or anxious  Diabetic Foot Exam - Simple   No data filed    LABS Reviewed Lab  Results  Component Value Date   HGBA1C 5.7 (A) 09/14/2023   HGBA1C 6.1 05/14/2023   HGBA1C 5.7 01/11/2023   Lab Results  Component Value Date   FRUCTOSAMINE 211 05/14/2023   FRUCTOSAMINE 241 02/24/2020   FRUCTOSAMINE 263 05/15/2019   Lab Results  Component Value Date   CHOL 126 08/13/2023   HDL 53.10 08/13/2023   LDLCALC 63 08/13/2023   LDLDIRECT 80.1 04/15/2014   TRIG 51.0 08/13/2023   CHOLHDL 2 08/13/2023   Lab Results  Component Value Date   MICRALBCREAT 3.0 05/14/2023   MICRALBCREAT 3.4  01/11/2023   Lab Results  Component Value Date   CREATININE 1.60 (H) 08/13/2023   Lab Results  Component Value Date   GFR 41.57 (L) 08/13/2023   ASSESSMENT / PLAN  1. Type 2 diabetes mellitus with hyperglycemia, without long-term current use of insulin (HCC)    Diabetes Mellitus type 2, complicated by CKD. - Diabetic status / severity: Controlled.  Lab Results  Component Value Date   HGBA1C 5.7 (A) 09/14/2023    - Hemoglobin A1c goal : <7%  - Medications: No change.  I) continue Rybelsus 14 mg daily. II) continue Amaryl 1 mg daily.  - Home glucose testing: In the morning fasting and at bedtime. - Discussed/ Gave Hypoglycemia treatment plan.  Discussed about potential hypoglycemic symptoms.  # Consult : not required at this time.   # Annual urine for microalbuminuria/ creatinine ratio, no microalbuminuria currently, he has CKD following with nephrology. Last  Lab Results  Component Value Date   MICRALBCREAT 3.0 05/14/2023    # Foot check nightly.  # Annual dilated diabetic eye exams.   - Diet: Make healthy diabetic food choices - Life style / activity / exercise: Discussed.  2. Blood pressure  -  BP Readings from Last 1 Encounters:  09/14/23 132/80    - Control is in target.  - No change in current plans.  3. Lipid status / Hyperlipidemia - Last  Lab Results  Component Value Date   LDLCALC 63 08/13/2023   - Continue atorvastatin 20 mg daily.  Managed by primary care provider.  Diagnoses and all orders for this visit:  Type 2 diabetes mellitus with hyperglycemia, without long-term current use of insulin (HCC) -     POCT glycosylated hemoglobin (Hb A1C)   DISPOSITION Follow up in clinic in 4 months suggested.   All questions answered and patient verbalized understanding of the plan.  Noah Darrious Youman, MD [facility] End[facility] [address] [place], [address] [place], [place] [address] Phone # [phone]  At least part of  this note was generated using voice recognition software. Inadvertent word errors may have occurred, which were not recognized during the proofreading process.

## 2023-09-14 NOTE — Patient Instructions (Signed)
 Latest Reference Range & Units 05/14/23 08:52 09/14/23 09:04  Hemoglobin A1C 4.0 - 5.6 % 6.1 5.7 !  !: Data is abnormal  No change in diabetes medications.

## 2023-09-18 ENCOUNTER — Other Ambulatory Visit: Payer: Self-pay | Admitting: Internal Medicine

## 2023-10-03 ENCOUNTER — Ambulatory Visit: Payer: Medicare PPO | Admitting: Medical Oncology

## 2023-10-03 ENCOUNTER — Other Ambulatory Visit: Payer: Medicare PPO

## 2023-10-09 ENCOUNTER — Encounter (HOSPITAL_BASED_OUTPATIENT_CLINIC_OR_DEPARTMENT_OTHER): Payer: Self-pay | Admitting: Pulmonary Disease

## 2023-10-09 ENCOUNTER — Ambulatory Visit (HOSPITAL_BASED_OUTPATIENT_CLINIC_OR_DEPARTMENT_OTHER): Payer: Medicare PPO | Admitting: Pulmonary Disease

## 2023-10-09 ENCOUNTER — Other Ambulatory Visit: Payer: Self-pay | Admitting: Hematology & Oncology

## 2023-10-09 VITALS — BP 126/74 | HR 60 | Ht 67.5 in | Wt 223.5 lb

## 2023-10-09 DIAGNOSIS — R0789 Other chest pain: Secondary | ICD-10-CM | POA: Diagnosis not present

## 2023-10-09 DIAGNOSIS — J4530 Mild persistent asthma, uncomplicated: Secondary | ICD-10-CM | POA: Diagnosis not present

## 2023-10-09 DIAGNOSIS — Z86711 Personal history of pulmonary embolism: Secondary | ICD-10-CM

## 2023-10-09 DIAGNOSIS — Z86718 Personal history of other venous thrombosis and embolism: Secondary | ICD-10-CM

## 2023-10-09 MED ORDER — ALBUTEROL SULFATE HFA 108 (90 BASE) MCG/ACT IN AERS: 2.0000 | INHALATION_SPRAY | Freq: Four times a day (QID) | RESPIRATORY_TRACT | 0 refills | Status: DC | PRN

## 2023-10-09 NOTE — Patient Instructions (Signed)
 Asthmatic bronchitis Chronic bronchitis --CONTINUE Spiriva 2.5 mcg TWO puffs ONCE a day --CONTINUE Albuterol AS NEEDED for shortness of breath or wheezing --Use flutter valve as needed for chest congestion

## 2023-10-09 NOTE — Progress Notes (Signed)
 Subjective:   PATIENT ID: Noah Barnes GENDER: male DOB: Mar 29, 1946, MRN: [id number]   HPI  Chief Complaint  Patient presents with   Follow-up    Asthmatic Bronchitis   Reason for Visit: Follow-up  [name] is a 78 year old male former smoker with HTN, DM2, hx DVT/PE in 2020 who presents for follow-up.  Synopsis: He reports many years of chest congestion worsened with cold weather. He has shortness of breath and congested cough. Denies wheezing. No limitation in activity and perform house work. He has been on Advair twice a day for three months and has made minimal improvement. Mucinex is ineffective.  On review of EMR he was seen by his PCP Dr Noah Barnes for acute visit for right chest tightness/soreness associated with cough. He was treated with augmentin for possible pneumonia. His symptoms have persistent since then for >2 months and are worse than his baseline symptoms.   11/03/21 Since our last visit he had Spiriva added to his Advair and rx flutter valve. He has been compliant with his inhalers. Uses nebulizer once a day. Has not used flutter valve. Feels his current regimen is working for him. Cold weather usually triggers him and causes chest tightening. With warmer weather he symptoms improved. Denies cough. Shortness of breath with exertion. No current chest tightness.  02/27/22 He is compliant with Advair and Spiriva. His breathing has been better this week due to warm weather. Fall and winter is his worst symptom as cold weather triggers and changes in humidity. Denies shortness of breath, cough or wheezing. He was last treated for exacerbation in early Aug 2023 with antibiotics, no steroids.  05/19/22 Since our last visit, he had outpatient exacerbation in Sept/October. He is doing well on increased dose of Advair 250 and Spiriva. Denies coughing, shortness of breath or wheezing.Late fall and winter are worst times. No limitations in activity. No dedicated  exercise  04/12/23 Since our last visit he has only been on the Spiriva and feels symptoms are well controlled. Previously needed Wixela 250 in fall and spring to help minimize symptoms since fall and winter are his worst times.  Denies shortness of breath, cough or wheezing. Not exercising regularly.  10/09/23 Since our last visit he has been using Spiriva as needed. Denies shortness of breath, cough or wheezing. Usually have difficult winters but has been doing well for the last two years. No exacerbations for >1 year.   Social History: Former smoker ~ 7 pack years   Past Medical History:  Diagnosis Date   CHF (congestive heart failure) (HCC)    Chronic diastolic heart failure (HCC)    Constipation    Diabetes mellitus    Diverticulitis    Diverticulosis    ED (erectile dysfunction)    Gout    Heart murmur    Hyperlipidemia    Hypertension    IBS (irritable bowel syndrome)    Renal calculus    Renal insufficiency    Tubular adenoma of colon      Family History  Problem Relation Age of Onset   Heart attack Mother 58   Hypertension Mother    Prostate cancer Father 20   Diabetes Father    Irritable bowel syndrome Father    Irritable bowel syndrome Sister        x 2   Colon cancer Brother        dx in his late 76's   Heart disease Brother    Irritable bowel  syndrome Brother    Stroke Neg Hx    Esophageal cancer Neg Hx    Rectal cancer Neg Hx    Stomach cancer Neg Hx      Social History   Occupational History   Occupation: Psychologist, counselling: [employer]    Comment: [phone]  Tobacco Use   Smoking status: Former    Current packs/day: 0.00    Average packs/day: 0.3 packs/day for 23.0 years (6.9 ttl pk-yrs)    Types: Cigarettes    Start date: 07/03/1968    Quit date: 07/04/1991    Years since quitting: 32.2   Smokeless tobacco: Never   Tobacco comments:    smoked age [age], up to 1/3 ppd; may be less  Vaping Use   Vaping status:  Never Used  Substance and Sexual Activity   Alcohol use: Not Currently    Comment: rarely   Drug use: No   Sexual activity: Yes    Allergies  Allergen Reactions   Ace Inhibitors Other (See Comments)    Severe AKI due to ARB + dehydration Aug 2016   Angiotensin Receptor Blockers Other (See Comments)    Severe AKI due to ARB + dehydration Aug 2016     Outpatient Medications Prior to Visit  Medication Sig Dispense Refill   Accu-Chek Softclix Lancets lancets CHECK BLOOD SUGAR TWICE A DAY. 100 each 2   albuterol (PROVENTIL) (2.5 MG/3ML) 0.083% nebulizer solution Take 3 mLs (2.5 mg total) by nebulization every 6 (six) hours as needed for wheezing or shortness of breath. 75 mL 5   allopurinol (ZYLOPRIM) 300 MG tablet Take 1 tablet by mouth once daily 90 tablet 0   atorvastatin (LIPITOR) 20 MG tablet Take 1 tablet by mouth once daily 90 tablet 0   Blood Glucose Monitoring Suppl (ACCU-CHEK GUIDE ME) w/Device KIT 1 each by Does not apply route in the morning and at bedtime. Use Accu Chek Guide me device to check blood sugar twice daily. 1 kit 0   ELIQUIS 2.5 MG TABS tablet Take 1 tablet by mouth twice daily 180 tablet 0   FERREX 150 150 MG capsule Take 1 capsule by mouth once daily 90 capsule 0   furosemide (LASIX) 40 MG tablet Take 40 mg by mouth daily.     GAVILYTE-N WITH FLAVOR PACK 420 g solution See admin instructions.     glimepiride (AMARYL) 1 MG tablet Take 1 tablet (1 mg total) by mouth daily with breakfast. 90 tablet 0   glucose blood (ACCU-CHEK GUIDE) test strip USE AS DIRECTED TO TEST BLOOD SUGAR TWICE DAILY 100 strip 3   hydrALAZINE (APRESOLINE) 50 MG tablet TAKE 1 TABLET BY MOUTH TWICE DAILY **ANNUAL  APPOINTMENT  DUE  IN  [place].  MUST  SEE  PROVIDER  FOR  FUTURE  REFILLS** 180 tablet 2   linaclotide (LINZESS) 290 MCG CAPS capsule Take 1 capsule (290 mcg total) by mouth daily before breakfast. 90 capsule 3   lubiprostone (AMITIZA) 24 MCG capsule Take 1 capsule (24 mcg total) by  mouth 2 (two) times daily with a meal. 60 capsule 2   metoprolol tartrate (LOPRESSOR) 100 MG tablet Take 1 tablet (100 mg total) by mouth 2 (two) times daily. 180 tablet 1   Semaglutide (RYBELSUS) 14 MG TABS TAKE 1 TABLET BY MOUTH IN THE MORNING 30 MINUTES BEFORE BREAKFAST WITH SIP OF WATER 30 tablet 3   sildenafil (VIAGRA) 100 MG tablet TAKE 1 TABLET BY MOUTH  EVERY DAY AS NEEDED FOR ERECTILE DYSFUNCTION 6 tablet 1   tamsulosin (FLOMAX) 0.4 MG CAPS capsule TAKE 1 CAPSULE BY MOUTH ONCE DAILY AFTER SUPPER 90 capsule 0   Tiotropium Bromide Monohydrate (SPIRIVA RESPIMAT) 2.5 MCG/ACT AERS Inhale 2 puffs into the lungs daily. 4 g 11   tiZANidine (ZANAFLEX) 4 MG tablet Take 1 tablet (4 mg total) by mouth every 6 (six) hours as needed for muscle spasms. 30 tablet 0   Vitamin D, Ergocalciferol, (DRISDOL) 1.25 MG (50000 UNIT) CAPS capsule Take 50,000 Units by mouth once a week.     albuterol (VENTOLIN HFA) 108 (90 Base) MCG/ACT inhaler Inhale 2 puffs into the lungs every 6 (six) hours as needed for wheezing or shortness of breath. 8 g 0   fluticasone-salmeterol (ADVAIR DISKUS) 250-50 MCG/ACT AEPB Inhale 1 puff into the lungs in the morning and at bedtime. 60 each 5   No facility-administered medications prior to visit.    Review of Systems  Constitutional:  Negative for chills, diaphoresis, fever, malaise/fatigue and weight loss.  HENT:  Negative for congestion.   Respiratory:  Negative for cough, hemoptysis, sputum production, shortness of breath and wheezing.   Cardiovascular:  Negative for chest pain, palpitations and leg swelling.     Objective:   Vitals:   10/09/23 0923  BP: 126/74  Pulse: 60  SpO2: 96%  Weight: 223 lb 8 oz (101.4 kg)  Height: 5' 7.5" (1.715 m)   SpO2: 96 %  Physical Exam: General: Well-appearing, no acute distress HENT: [place], AT Eyes: EOMI, no scleral icterus Respiratory: Clear to auscultation bilaterally.  No crackles, wheezing or rales Cardiovascular: RRR, -M/R/G,  no JVD Extremities:-Edema,-tenderness Neuro: AAO x4, CNII-XII grossly intact Psych: Normal mood, normal affect  Data Reviewed:  Imaging: CXR 07/26/21 - No active cardiopulmonary disease. No infiltrate effusion or edema CXR 01/31/22 - No active cardiopulmonary disease  PFT: 11/03/21 FVC 2.78 (83%) FEV1 2.35 (95%) Ratio 84  TLC 81% DLCO 80% Interpretation: No obstructive or restrictive defect. Normal lung volumes and normal DLCO.  Labs: CBC    Component Value Date/Time   WBC 3.8 (L) 08/13/2023 0907   RBC 5.00 08/13/2023 0907   HGB 13.6 08/13/2023 0907   HGB 13.7 04/04/2023 0933   HCT 42.7 08/13/2023 0907   PLT 189.0 08/13/2023 0907   PLT 204 04/04/2023 0933   MCV 85.4 08/13/2023 0907   MCH 27.0 04/04/2023 0933   MCHC 31.7 08/13/2023 0907   RDW 15.5 08/13/2023 0907   LYMPHSABS 1.3 08/13/2023 0907   MONOABS 0.4 08/13/2023 0907   EOSABS 0.1 08/13/2023 0907   BASOSABS 0.0 08/13/2023 0907   Absolute eos 03/25/21 - 100     Assessment & Plan:   Discussion: 78 year old former smoker with HTN, DM2, hx DVT/PE in 2020 who presents for follow-up. Last exacerbation fall 2023 >2 years ago. Well controlled on LAMA intermittently. Takes more consistently in spring/fall. Discussed clinical course and management of asthmatic bronchitis including bronchodilator regimen, preventive care including vaccinations and action plan for exacerbation.  Asthmatic bronchitis Chronic bronchitis --CONTINUE Spiriva 2.5 mcg TWO puffs ONCE a day --CONTINUE Albuterol AS NEEDED for shortness of breath or wheezing --Use flutter valve as needed for chest congestion  Action Plan Increase Albuterol for worsening shortness of breath, wheezing and cough. If you symptoms do not improve in 24-48 hours, please our office for evaluation and/or prednisone taper and/or antibiotics.   Health Maintenance Immunization History  Administered Date(s) Administered   Fluad Quad(high Dose 65+) 03/06/2019,  05/05/2020,  03/26/2021, 04/04/2022   Fluad Trivalent(High Dose 65+) 03/19/2023   Influenza, High Dose Seasonal PF 07/12/2016, 04/10/2017, 03/22/2018   PFIZER Comirnaty(Gray Top)Covid-19 Tri-Sucrose Vaccine 01/14/2021   PFIZER(Purple Top)SARS-COV-2 Vaccination 08/13/2019, 09/10/2019, 04/06/2020   Pfizer Covid-19 Vaccine Bivalent Booster 32yrs & up 06/08/2021   Pfizer(Comirnaty)Fall Seasonal Vaccine 12 years and older 04/19/2022, 03/26/2023   Pneumococcal Conjugate-13 01/06/2019   Pneumococcal Polysaccharide-23 03/01/2020   CT Lung Screen - not qualified. Insufficient tobacco history  No orders of the defined types were placed in this encounter.  Meds ordered this encounter  Medications   albuterol (VENTOLIN HFA) 108 (90 Base) MCG/ACT inhaler    Sig: Inhale 2 puffs into the lungs every 6 (six) hours as needed for wheezing or shortness of breath.    Dispense:  6.7 g    Refill:  0    Return in about 6 months (around 04/09/2024).  I have spent a total time of 35-minutes on the day of the appointment including chart review, data review, collecting history, coordinating care and discussing medical diagnosis and plan with the patient/family. Past medical history, allergies, medications were reviewed. Pertinent imaging, labs and tests included in this note have been reviewed and interpreted independently by me.  Ouida Abeyta Mechele Collin, MD [facility] 10/09/2023 9:46 AM

## 2023-10-10 ENCOUNTER — Ambulatory Visit: Payer: Medicare PPO | Admitting: Internal Medicine

## 2023-10-10 ENCOUNTER — Encounter: Payer: Self-pay | Admitting: Internal Medicine

## 2023-10-10 VITALS — BP 124/70 | HR 64 | Ht 67.5 in | Wt 221.4 lb

## 2023-10-10 DIAGNOSIS — K5790 Diverticulosis of intestine, part unspecified, without perforation or abscess without bleeding: Secondary | ICD-10-CM | POA: Diagnosis not present

## 2023-10-10 DIAGNOSIS — Z800 Family history of malignant neoplasm of digestive organs: Secondary | ICD-10-CM

## 2023-10-10 DIAGNOSIS — Z8719 Personal history of other diseases of the digestive system: Secondary | ICD-10-CM

## 2023-10-10 DIAGNOSIS — Z860100 Personal history of colon polyps, unspecified: Secondary | ICD-10-CM

## 2023-10-10 DIAGNOSIS — K5904 Chronic idiopathic constipation: Secondary | ICD-10-CM | POA: Diagnosis not present

## 2023-10-10 DIAGNOSIS — N200 Calculus of kidney: Secondary | ICD-10-CM | POA: Diagnosis not present

## 2023-10-10 MED ORDER — NA SULFATE-K SULFATE-MG SULF 17.5-3.13-1.6 GM/177ML PO SOLN: 1.0000 | Freq: Once | ORAL | 2 refills | Status: AC

## 2023-10-10 NOTE — Patient Instructions (Addendum)
 Continue Linzess 290 mcg daily.   We have sent the following medications to your pharmacy for you to pick up at your convenience: Suprep to use when purging your bowels.   You will be due for a recall colonoscopy in 01/2025. We will send you a reminder in the mail when it gets closer to that time.  _______________________________________________________  If your blood pressure at your visit was 140/90 or greater, please contact your primary care physician to follow up on this.  _______________________________________________________  If you are age 40 or older, your body mass index should be between 23-30. Your Body mass index is 34.16 kg/m. If this is out of the aforementioned range listed, please consider follow up with your Primary Care Provider.  If you are age 49 or younger, your body mass index should be between 19-25. Your Body mass index is 34.16 kg/m. If this is out of the aformentioned range listed, please consider follow up with your Primary Care Provider.   ________________________________________________________  The [facility] providers would like to encourage you to use [facility] to communicate with providers for non-urgent requests or questions.  Due to long hold times on the telephone, sending your provider a message by [facility] may be a faster and more efficient way to get a response.  Please allow 48 business hours for a response.  Please remember that this is for non-urgent requests.  _______________________________________________________

## 2023-10-10 NOTE — Progress Notes (Signed)
   Subjective:    Patient ID: [name], male    DOB: 02/23/1946, 78 y.o.   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  He was last seen in December by Hyacinth Meeker, PA-C.  He is here alone today.  He experiences irregular bowel movements, describing his system as 'up and down'. He uses Linzess, which is mostly effective, but sometimes requires him to 'purge' to reset his system. He has not needed to purge in the last two to three months. He experiences occasional stomach noises, described as gurgling, associated with the motility effects of Linzess. He also reports bloating and soreness in the lower abdomen, sometimes occurring when his bowel movements are not regular. No bleeding is reported.  He has a history of diverticulosis and is aware of the need for regular colonoscopies due to a family history of colon cancer in his brother, who passed away from the disease. His last colonoscopy showed no polyps.  He has a known kidney stone that causes discomfort primarily when he is in bed, but it does not bother him during the day. A scan confirmed the presence of the stone, and it is too small to require intervention at this time.  Review of Systems As per HPI, otherwise negative  Current Medications, Allergies, Past Medical History, Past Surgical History, Family History and Social History were reviewed in [employer] record.      Objective:   Physical Exam BP 124/70 (BP Location: Left Arm, Patient Position: Sitting, Cuff Size: Normal)   Pulse 64   Ht 5' 7.5" (1.715 m)   Wt 221 lb 6.4 oz (100.4 kg)   BMI 34.16 kg/m  Gen: awake, alert, NAD HEENT: anicteric  Abd: soft, NT, protuberant, +BS throughout Ext: no c/c/e Neuro:  nonfocal     Assessment & Plan:  Chronic Idiopathic Constipation   Chronic idiopathic constipation with intermittent Linzess effectiveness. Periodic purging necessary. Suprep preferred for purging.   - Continue Linzess 290 mcg as prescribed.   - Prescribe Suprep with two refills for purging as needed.   - Advise using MyChart for communication until phone system improvements are complete.    Diverticulosis   Diverticulosis with occasional soreness. No signs of diverticulitis.   - Monitor for symptoms of diverticulitis and contact provider if he occurs.    Family History of Colon Cancer   Family history necessitates five-year surveillance colonoscopy despite no personal polyp history.   - Schedule colonoscopy for summer 2026.    Kidney Stone   Known kidney stone causing discomfort when lying down. No intervention required currently.   - Monitor for symptom changes or increased pain.    Follow-up   Follow-up plan discussed for continued care and condition monitoring. MyChart access requires visit within 12 months.   - Schedule follow-up appointment in 12 months to maintain MyChart access and monitor conditions.  30 minutes total spent today including patient facing time, coordination of care, reviewing medical history/procedures/pertinent radiology studies, and documentation of the encounter.

## 2023-10-11 ENCOUNTER — Inpatient Hospital Stay: Payer: Medicare PPO | Admitting: Medical Oncology

## 2023-10-11 ENCOUNTER — Encounter: Payer: Self-pay | Admitting: Medical Oncology

## 2023-10-11 ENCOUNTER — Inpatient Hospital Stay: Payer: Medicare PPO | Attending: Hematology & Oncology

## 2023-10-11 VITALS — BP 124/75 | HR 50 | Temp 98.2°F | Resp 18 | Ht 67.0 in | Wt 221.4 lb

## 2023-10-11 DIAGNOSIS — Z86718 Personal history of other venous thrombosis and embolism: Secondary | ICD-10-CM

## 2023-10-11 DIAGNOSIS — Z86711 Personal history of pulmonary embolism: Secondary | ICD-10-CM | POA: Diagnosis not present

## 2023-10-11 DIAGNOSIS — Z7901 Long term (current) use of anticoagulants: Secondary | ICD-10-CM

## 2023-10-11 LAB — CBC
HCT: 43.4 % (ref 39.0–52.0)
Hemoglobin: 13.6 g/dL (ref 13.0–17.0)
MCH: 27 pg (ref 26.0–34.0)
MCHC: 31.3 g/dL (ref 30.0–36.0)
MCV: 86.1 fL (ref 80.0–100.0)
Platelets: 185 10*3/uL (ref 150–400)
RBC: 5.04 MIL/uL (ref 4.22–5.81)
RDW: 14.5 % (ref 11.5–15.5)
WBC: 4.3 10*3/uL (ref 4.0–10.5)
nRBC: 0 % (ref 0.0–0.2)

## 2023-10-11 LAB — CMP (CANCER CENTER ONLY)
ALT: 38 U/L (ref 0–44)
AST: 15 U/L (ref 15–41)
Albumin: 4 g/dL (ref 3.5–5.0)
Alkaline Phosphatase: 77 U/L (ref 38–126)
Anion gap: 9 (ref 5–15)
BUN: 31 mg/dL — ABNORMAL HIGH (ref 8–23)
CO2: 17 mmol/L — ABNORMAL LOW (ref 22–32)
Calcium: 9.3 mg/dL (ref 8.9–10.3)
Chloride: 113 mmol/L — ABNORMAL HIGH (ref 98–111)
Creatinine: 1.66 mg/dL — ABNORMAL HIGH (ref 0.61–1.24)
GFR, Estimated: 42 mL/min — ABNORMAL LOW (ref >60)
Glucose, Bld: 110 mg/dL — ABNORMAL HIGH (ref 70–99)
Potassium: 4.4 mmol/L (ref 3.5–5.1)
Sodium: 139 mmol/L (ref 135–145)
Total Bilirubin: 0.6 mg/dL (ref 0.0–1.2)
Total Protein: 6.4 g/dL — ABNORMAL LOW (ref 6.5–8.1)

## 2023-10-11 NOTE — Progress Notes (Signed)
 Hematology and Oncology Follow Up Visit  Noah Barnes [id number] [birth year] 78 y.o. 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   He states that he has been well. He has no concerns today  He continues to tolerate Eliquis 2.5 mg PO BID without any side effects.  No blood loss, bruising or petechiae.  No fever, chills, n/v, cough, rash, dizziness, SOB, chest pain, palpitations, abdominal pain or changes in bowel or bladder habits.  No swelling, numbness or tingling in his extremities at this time. 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  No falls or syncope reported.  Appetite and hydration are good Wt Readings from Last 3 Encounters:  10/11/23 221 lb 6.4 oz (100.4 kg)  10/10/23 221 lb 6.4 oz (100.4 kg)  10/09/23 223 lb 8 oz (101.4 kg)   ECOG Performance Status: 0 - Asymptomatic  Medications:  Allergies as of 10/11/2023       Reactions   Ace Inhibitors Other (See Comments)   Severe AKI due to ARB + dehydration Aug 2016   Angiotensin Receptor Blockers Other (See Comments)   Severe AKI due to ARB + dehydration Aug 2016        Medication List        Accurate as of October 11, 2023 11:58 AM. If you have any questions, ask your nurse or doctor.          STOP taking these medications    GaviLyte-N with Flavor Pack 420 g solution Generic drug: polyethylene glycol-electrolytes Stopped by: Rushie Chestnut       TAKE these medications    Accu-Chek Guide Me w/Device Kit 1 each by Does not apply route in the morning and at bedtime. Use Accu Chek Guide me device to check blood sugar twice daily.   Accu-Chek Guide test strip Generic drug: glucose blood USE AS DIRECTED TO TEST BLOOD SUGAR TWICE DAILY   Accu-Chek Softclix Lancets lancets CHECK BLOOD SUGAR TWICE A  DAY.   albuterol (2.5 MG/3ML) 0.083% nebulizer solution Commonly known as: PROVENTIL Take 3 mLs (2.5 mg total) by nebulization every 6 (six) hours as needed for wheezing or shortness of breath.   albuterol 108 (90 Base) MCG/ACT inhaler Commonly known as: VENTOLIN HFA Inhale 2 puffs into the lungs every 6 (six) hours as needed for wheezing or shortness of breath.   allopurinol 300 MG tablet Commonly known as: ZYLOPRIM Take 1 tablet by mouth once daily   atorvastatin 20 MG tablet Commonly known as: LIPITOR Take 1 tablet by mouth once daily   Eliquis 2.5 MG Tabs tablet Generic drug: apixaban Take 1 tablet by mouth twice daily   Ferrex 150 150 MG capsule Generic drug: iron polysaccharides Take 1 capsule by mouth once daily   furosemide 40 MG tablet Commonly known as: LASIX Take 40 mg by mouth daily.   glimepiride 1 MG tablet Commonly known as: AMARYL Take 1 tablet (1 mg total) by mouth daily with breakfast.   hydrALAZINE 50 MG tablet Commonly known as: APRESOLINE TAKE 1 TABLET BY MOUTH TWICE DAILY **ANNUAL  APPOINTMENT  DUE  IN  [place].  MUST  SEE  PROVIDER  FOR  FUTURE  REFILLS**   linaclotide 290 MCG Caps capsule Commonly known as: Linzess Take 1 capsule (290 mcg  total) by mouth daily before breakfast.   metoprolol tartrate 100 MG tablet Commonly known as: LOPRESSOR Take 1 tablet (100 mg total) by mouth 2 (two) times daily.   Rybelsus 14 MG Tabs Generic drug: Semaglutide TAKE 1 TABLET BY MOUTH IN THE MORNING 30 MINUTES BEFORE BREAKFAST WITH SIP OF WATER   sildenafil 100 MG tablet Commonly known as: VIAGRA TAKE 1 TABLET BY MOUTH EVERY DAY AS NEEDED FOR ERECTILE DYSFUNCTION   Spiriva Respimat 2.5 MCG/ACT Aers Generic drug: Tiotropium Bromide Monohydrate Inhale 2 puffs into the lungs daily.   tamsulosin 0.4 MG Caps capsule Commonly known as: FLOMAX TAKE 1 CAPSULE BY MOUTH ONCE DAILY AFTER SUPPER   tiZANidine 4 MG tablet Commonly known as: Zanaflex Take 1  tablet (4 mg total) by mouth every 6 (six) hours as needed for muscle spasms.   Vitamin D (Ergocalciferol) 1.25 MG (50000 UNIT) Caps capsule Commonly known as: DRISDOL Take 50,000 Units by mouth once a week.        Allergies:  Allergies  Allergen Reactions   Ace Inhibitors Other (See Comments)    Severe AKI due to ARB + dehydration Aug 2016   Angiotensin Receptor Blockers Other (See Comments)    Severe AKI due to ARB + dehydration Aug 2016    Past Medical History, Surgical history, Social history, and Family History were reviewed and updated.  Review of Systems: All other 10 point review of systems is negative.   Physical Exam:  height is 5\' 7"  (1.702 m) and weight is 221 lb 6.4 oz (100.4 kg). His oral temperature is 98.2 F (36.8 C). His blood pressure is 124/75 and his pulse is 50 (abnormal). His respiration is 18 and oxygen saturation is 100%.   Wt Readings from Last 3 Encounters:  10/11/23 221 lb 6.4 oz (100.4 kg)  10/10/23 221 lb 6.4 oz (100.4 kg)  10/09/23 223 lb 8 oz (101.4 kg)    Ocular: Sclerae unicteric, pupils equal, round and reactive to light Ear-nose-throat: Oropharynx clear, dentition fair Lymphatic: No cervical or supraclavicular adenopathy Lungs no rales or rhonchi, good excursion bilaterally Heart regular rate and rhythm, no murmur appreciated Abd soft, nontender, positive bowel sounds MSK no focal spinal tenderness, no joint edema Neuro: non-focal, well-oriented, appropriate affect  Lab Results  Component Value Date   WBC 4.3 10/11/2023   HGB 13.6 10/11/2023   HCT 43.4 10/11/2023   MCV 86.1 10/11/2023   PLT 185 10/11/2023   Lab Results  Component Value Date   FERRITIN 211 03/28/2019   IRON 29 (L) 03/28/2019   TIBC 216 (L) 03/28/2019   UIBC 187 03/28/2019   IRONPCTSAT 13 (L) 03/28/2019   Lab Results  Component Value Date   RETICCTPCT 1.2 03/28/2019   RBC 5.04 10/11/2023   No results found for: "KPAFRELGTCHN", "LAMBDASER",  "KAPLAMBRATIO" No results found for: "IGGSERUM", "IGA", "IGMSERUM" No results found for: "TOTALPROTELP", "ALBUMINELP", "A1GS", "A2GS", "BETS", "BETA2SER", "GAMS", "MSPIKE", "SPEI"   Chemistry      Component Value Date/Time   NA 139 10/11/2023 1021   K 4.4 10/11/2023 1021   CL 113 (H) 10/11/2023 1021   CO2 17 (L) 10/11/2023 1021   BUN 31 (H) 10/11/2023 1021   CREATININE 1.66 (H) 10/11/2023 1021      Component Value Date/Time   CALCIUM 9.3 10/11/2023 1021   ALKPHOS 77 10/11/2023 1021   AST 15 10/11/2023 1021   ALT 38 10/11/2023 1021   BILITOT 0.6 10/11/2023 1021      Encounter Diagnoses  Name Primary?   History of pulmonary embolus (PE)    History of DVT of lower extremity    Long term current use of anticoagulant therapy Yes    Impression and Plan: Mr. Nakanishi is a very pleasant 78 yo African American gentleman with 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 He completed 6 months of full anticoagulation and is now doing well on maintenance low dose Eliquis lifelong.   He continues to do well on the Eliquis. His creatinine clearance is stable- no dose adjustment necessary.  Continue Eliquis at current dose.     RTC 6 months APP, labs(CBC, CMP) -[place]  Rushie Chestnut, PA-C 4/10/202511:58 AM

## 2023-10-31 ENCOUNTER — Other Ambulatory Visit: Payer: Self-pay | Admitting: Internal Medicine

## 2023-11-07 NOTE — Progress Notes (Unsigned)
 Subjective:    Patient ID: Noah Barnes, male    DOB: [birth year], 78 y.o.   MRN: [id number]      HPI Noah Barnes is here for No chief complaint on file.        Medications and allergies reviewed with patient and updated if appropriate.  Current Outpatient Medications on File Prior to Visit  Medication Sig Dispense Refill   Accu-Chek Softclix Lancets lancets CHECK BLOOD SUGAR TWICE A DAY. 100 each 2   albuterol  (PROVENTIL ) (2.5 MG/3ML) 0.083% nebulizer solution Take 3 mLs (2.5 mg total) by nebulization every 6 (six) hours as needed for wheezing or shortness of breath. 75 mL 5   albuterol  (VENTOLIN  HFA) 108 (90 Base) MCG/ACT inhaler Inhale 2 puffs into the lungs every 6 (six) hours as needed for wheezing or shortness of breath. 6.7 g 0   allopurinol  (ZYLOPRIM ) 300 MG tablet Take 1 tablet by mouth once daily 90 tablet 0   atorvastatin  (LIPITOR) 20 MG tablet Take 1 tablet by mouth once daily 90 tablet 0   Blood Glucose Monitoring Suppl (ACCU-CHEK GUIDE ME) w/Device KIT 1 each by Does not apply route in the morning and at bedtime. Use Accu Chek Guide me device to check blood sugar twice daily. 1 kit 0   ELIQUIS  2.5 MG TABS tablet Take 1 tablet by mouth twice daily 180 tablet 0   FERREX 150 150 MG capsule Take 1 capsule by mouth once daily 90 capsule 0   furosemide  (LASIX ) 40 MG tablet Take 40 mg by mouth daily.     glimepiride  (AMARYL ) 1 MG tablet Take 1 tablet (1 mg total) by mouth daily with breakfast. 90 tablet 0   glucose blood (ACCU-CHEK GUIDE) test strip USE AS DIRECTED TO TEST BLOOD SUGAR TWICE DAILY 100 strip 3   hydrALAZINE  (APRESOLINE ) 50 MG tablet TAKE 1 TABLET BY MOUTH TWICE DAILY **ANNUAL  APPOINTMENT  DUE  IN  [place].  MUST  SEE  PROVIDER  FOR  FUTURE  REFILLS** 180 tablet 2   linaclotide  (LINZESS ) 290 MCG CAPS capsule Take 1 capsule (290 mcg total) by mouth daily before breakfast. 90 capsule 3   metoprolol  tartrate (LOPRESSOR ) 100 MG tablet Take 1 tablet (100 mg total)  by mouth 2 (two) times daily. 180 tablet 1   Semaglutide  (RYBELSUS ) 14 MG TABS TAKE 1 TABLET BY MOUTH IN THE MORNING 30 MINUTES BEFORE BREAKFAST WITH SIP OF WATER 30 tablet 3   sildenafil  (VIAGRA ) 100 MG tablet TAKE 1 TABLET BY MOUTH EVERY DAY AS NEEDED FOR ERECTILE DYSFUNCTION 6 tablet 1   tamsulosin  (FLOMAX ) 0.4 MG CAPS capsule TAKE 1 CAPSULE BY MOUTH ONCE DAILY AFTER SUPPER 90 capsule 0   Tiotropium Bromide Monohydrate  (SPIRIVA  RESPIMAT) 2.5 MCG/ACT AERS Inhale 2 puffs into the lungs daily. 4 g 11   tiZANidine  (ZANAFLEX ) 4 MG tablet Take 1 tablet (4 mg total) by mouth every 6 (six) hours as needed for muscle spasms. 30 tablet 0   Vitamin D, Ergocalciferol, (DRISDOL) 1.25 MG (50000 UNIT) CAPS capsule Take 50,000 Units by mouth once a week.     No current facility-administered medications on file prior to visit.    Review of Systems     Objective:  There were no vitals filed for this visit. BP Readings from Last 3 Encounters:  10/11/23 124/75  10/10/23 124/70  10/09/23 126/74   Wt Readings from Last 3 Encounters:  10/11/23 221 lb 6.4 oz (100.4 kg)  10/10/23 221 lb 6.4 oz (100.4  kg)  10/09/23 223 lb 8 oz (101.4 kg)   There is no height or weight on file to calculate BMI.    Physical Exam         Assessment & Plan:    See Problem List for Assessment and Plan of chronic medical problems.

## 2023-11-08 ENCOUNTER — Encounter: Payer: Self-pay | Admitting: Internal Medicine

## 2023-11-08 ENCOUNTER — Ambulatory Visit: Admitting: Internal Medicine

## 2023-11-08 VITALS — BP 120/76 | HR 68 | Temp 98.0°F | Ht 67.0 in | Wt 219.0 lb

## 2023-11-08 DIAGNOSIS — I10 Essential (primary) hypertension: Secondary | ICD-10-CM

## 2023-11-08 DIAGNOSIS — M545 Low back pain, unspecified: Secondary | ICD-10-CM

## 2023-11-08 DIAGNOSIS — N200 Calculus of kidney: Secondary | ICD-10-CM | POA: Diagnosis not present

## 2023-11-08 NOTE — Patient Instructions (Addendum)
   Make an appointment with Dr Cleora Daft for your right lower back pain.   Medications changes include :   None    A referral was ordered Dr Dulcy Gibney and someone will call you to schedule an appointment.    A referral was ordered Dr Cleora Daft and someone will call you to schedule an appointment.

## 2023-11-08 NOTE — Assessment & Plan Note (Signed)
Chronic Blood pressure well controlled Continue hydralazine 50 mg twice daily, metoprolol 100 mg twice daily

## 2023-11-08 NOTE — Assessment & Plan Note (Signed)
 Chronic Has history of kidney stones and recent CT scan showed a 4 mm stone in the inferior pole of the right kidney Discussed this is not causing any pain and unless it moves we will not cause any pain Will refer back to Dr. Derrick Fling since he has a history of kidney stones and wants to figure out how to prevent them Stressed drinking plenty of water

## 2023-11-08 NOTE — Assessment & Plan Note (Signed)
 Acute Pain is likely muscular in nature Stress at this kidney stone in the right kidney is not causing any pain and not responsible for his current pain And try ice, heat, topical medication Can take Tylenol  Will refer to sports medicine for further treatment

## 2023-11-13 NOTE — Progress Notes (Unsigned)
 Ben Jackson D.[facility] [address] [place] [address] Phone: [phone]   Assessment and Plan:     There are no diagnoses linked to this encounter.  ***   Pertinent previous records reviewed include ***    Follow Up: ***     Subjective:   I, Avinash Maltos, am serving as a Neurosurgeon for Doctor Ulysees Gander  Chief Complaint: low back pain   HPI:   11/14/2023 Patient is a 78 year old male with low back pain. Patient states  Relevant Historical Information: ***  Additional pertinent review of systems negative.   Current Outpatient Medications:    Accu-Chek Softclix Lancets lancets, CHECK BLOOD SUGAR TWICE A DAY., Disp: 100 each, Rfl: 2   albuterol  (PROVENTIL ) (2.5 MG/3ML) 0.083% nebulizer solution, Take 3 mLs (2.5 mg total) by nebulization every 6 (six) hours as needed for wheezing or shortness of breath., Disp: 75 mL, Rfl: 5   albuterol  (VENTOLIN  HFA) 108 (90 Base) MCG/ACT inhaler, Inhale 2 puffs into the lungs every 6 (six) hours as needed for wheezing or shortness of breath., Disp: 6.7 g, Rfl: 0   allopurinol  (ZYLOPRIM ) 300 MG tablet, Take 1 tablet by mouth once daily, Disp: 90 tablet, Rfl: 0   atorvastatin  (LIPITOR) 20 MG tablet, Take 1 tablet by mouth once daily, Disp: 90 tablet, Rfl: 0   Blood Glucose Monitoring Suppl (ACCU-CHEK GUIDE ME) w/Device KIT, 1 each by Does not apply route in the morning and at bedtime. Use Accu Chek Guide me device to check blood sugar twice daily., Disp: 1 kit, Rfl: 0   ELIQUIS  2.5 MG TABS tablet, Take 1 tablet by mouth twice daily, Disp: 180 tablet, Rfl: 0   FERREX 150 150 MG capsule, Take 1 capsule by mouth once daily, Disp: 90 capsule, Rfl: 0   furosemide  (LASIX ) 40 MG tablet, Take 40 mg by mouth daily., Disp: , Rfl:    glimepiride  (AMARYL ) 1 MG tablet, Take 1 tablet (1 mg total) by mouth daily with breakfast., Disp: 90 tablet, Rfl: 0   glucose blood (ACCU-CHEK GUIDE) test strip, USE AS  DIRECTED TO TEST BLOOD SUGAR TWICE DAILY, Disp: 100 strip, Rfl: 3   hydrALAZINE  (APRESOLINE ) 50 MG tablet, TAKE 1 TABLET BY MOUTH TWICE DAILY **ANNUAL  APPOINTMENT  DUE  IN  [place].  MUST  SEE  PROVIDER  FOR  FUTURE  REFILLS**, Disp: 180 tablet, Rfl: 2   linaclotide  (LINZESS ) 290 MCG CAPS capsule, Take 1 capsule (290 mcg total) by mouth daily before breakfast., Disp: 90 capsule, Rfl: 3   metoprolol  tartrate (LOPRESSOR ) 100 MG tablet, Take 1 tablet (100 mg total) by mouth 2 (two) times daily., Disp: 180 tablet, Rfl: 1   Semaglutide  (RYBELSUS ) 14 MG TABS, TAKE 1 TABLET BY MOUTH IN THE MORNING 30 MINUTES BEFORE BREAKFAST WITH SIP OF WATER, Disp: 30 tablet, Rfl: 3   sildenafil  (VIAGRA ) 100 MG tablet, TAKE 1 TABLET BY MOUTH EVERY DAY AS NEEDED FOR ERECTILE DYSFUNCTION, Disp: 6 tablet, Rfl: 1   tamsulosin  (FLOMAX ) 0.4 MG CAPS capsule, TAKE 1 CAPSULE BY MOUTH ONCE DAILY AFTER SUPPER, Disp: 90 capsule, Rfl: 0   Tiotropium Bromide Monohydrate  (SPIRIVA  RESPIMAT) 2.5 MCG/ACT AERS, Inhale 2 puffs into the lungs daily., Disp: 4 g, Rfl: 11   tiZANidine  (ZANAFLEX ) 4 MG tablet, Take 1 tablet (4 mg total) by mouth every 6 (six) hours as needed for muscle spasms., Disp: 30 tablet, Rfl: 0   Vitamin D, Ergocalciferol, (DRISDOL) 1.25 MG (50000  UNIT) CAPS capsule, Take 50,000 Units by mouth once a week., Disp: , Rfl:    Objective:     There were no vitals filed for this visit.    There is no height or weight on file to calculate BMI.    Physical Exam:    ***   Electronically signed by:  Marshall Skeeter D[facility] 7:30 AM 11/13/23

## 2023-11-14 ENCOUNTER — Ambulatory Visit: Payer: Self-pay | Admitting: Sports Medicine

## 2023-11-14 ENCOUNTER — Ambulatory Visit (INDEPENDENT_AMBULATORY_CARE_PROVIDER_SITE_OTHER)

## 2023-11-14 ENCOUNTER — Ambulatory Visit: Admitting: Sports Medicine

## 2023-11-14 VITALS — BP 132/82 | HR 66 | Ht 67.0 in | Wt 219.0 lb

## 2023-11-14 DIAGNOSIS — M5136 Other intervertebral disc degeneration, lumbar region with discogenic back pain only: Secondary | ICD-10-CM

## 2023-11-14 DIAGNOSIS — G8929 Other chronic pain: Secondary | ICD-10-CM

## 2023-11-14 DIAGNOSIS — M545 Low back pain, unspecified: Secondary | ICD-10-CM | POA: Diagnosis not present

## 2023-11-14 DIAGNOSIS — M439 Deforming dorsopathy, unspecified: Secondary | ICD-10-CM | POA: Diagnosis not present

## 2023-11-14 DIAGNOSIS — M47816 Spondylosis without myelopathy or radiculopathy, lumbar region: Secondary | ICD-10-CM | POA: Diagnosis not present

## 2023-11-14 NOTE — Patient Instructions (Addendum)
 Recommend tylenol  [phone] mg nightly to decrease morning pain  May use Voltaren gel over areas of pain  Low back HEP  PT referral  4-5 week follow up

## 2023-11-16 ENCOUNTER — Other Ambulatory Visit: Payer: Self-pay | Admitting: Internal Medicine

## 2023-11-22 ENCOUNTER — Other Ambulatory Visit: Payer: Self-pay | Admitting: Internal Medicine

## 2023-11-23 ENCOUNTER — Telehealth: Payer: Self-pay

## 2023-11-23 NOTE — Telephone Encounter (Signed)
 This patient is appearing on a report for being at risk of failing the adherence measure for cholesterol (statin) medications this calendar year.   Medication: atorvastatin  20 mg Last fill date: 11/16/23 for 90 day supply  Insurance report was not up to date. No action needed at this time.   Abelina Abide, PharmD PGY1 Pharmacy Resident 11/23/2023 5:47 PM

## 2023-12-03 ENCOUNTER — Other Ambulatory Visit: Payer: Self-pay

## 2023-12-03 ENCOUNTER — Ambulatory Visit: Admitting: Physical Therapy

## 2023-12-03 ENCOUNTER — Encounter: Payer: Self-pay | Admitting: Physical Therapy

## 2023-12-03 DIAGNOSIS — M6281 Muscle weakness (generalized): Secondary | ICD-10-CM | POA: Diagnosis not present

## 2023-12-03 DIAGNOSIS — M5459 Other low back pain: Secondary | ICD-10-CM | POA: Diagnosis not present

## 2023-12-03 NOTE — Patient Instructions (Signed)
 Access Code: [email or web address] URL: [email or web address] Date: 12/03/2023 Prepared by: Leah Primus  Exercises - Supine Lower Trunk Rotation  - 1 x daily - 2 sets - 10 reps - 5 seconds hold - Clam with Resistance  - 1 x daily - 3 sets - 10 reps - Seated Hamstring Stretch  - 1 x daily - 3 reps - 20 seconds hold

## 2023-12-03 NOTE — Therapy (Signed)
 OUTPATIENT PHYSICAL THERAPY EVALUATION   Patient Name: Noah Barnes MRN: [id number] DOB:Sep 11, 1945, 78 y.o., male Today's Date: 12/03/2023   END OF SESSION:  PT End of Session - 12/03/23 1030     Visit Number 1    Number of Visits 9    Date for PT Re-Evaluation 01/28/24    Authorization Type [employer] MCR    Progress Note Due on Visit 10    PT Start Time 1015    PT Stop Time 1100    PT Time Calculation (min) 45 min    Activity Tolerance Patient tolerated treatment well    Behavior During Therapy WFL for tasks assessed/performed             Past Medical History:  Diagnosis Date   CHF (congestive heart failure) (HCC)    Chronic diastolic heart failure (HCC)    Constipation    Diabetes mellitus    Diverticulitis    Diverticulosis    ED (erectile dysfunction)    Gout    Heart murmur    Hyperlipidemia    Hypertension    IBS (irritable bowel syndrome)    Renal calculus    Renal insufficiency    Tubular adenoma of colon    Past Surgical History:  Procedure Laterality Date   COLONOSCOPY  1998   negative; no F/U   EXTRACORPOREAL SHOCK WAVE LITHOTRIPSY Left 03/20/2019   Procedure: EXTRACORPOREAL SHOCK WAVE LITHOTRIPSY (ESWL);  Surgeon: Marco Severs, MD;  Location: [facility];  Service: Urology;  Laterality: Left;   knee effusion tapped     Patient Active Problem List   Diagnosis Date Noted   B12 deficiency 08/12/2023   Acute pain of both shoulders 10/31/2022   Right shoulder pain 10/31/2022   Right-sided chest pain 04/19/2021   Chest congestion 04/15/2021   Acquired trigger finger of right little finger 12/30/2020   Bilateral primary osteoarthritis of knee 12/30/2020   Aortic atherosclerosis (HCC) 12/29/2020   Chronic bronchitis (HCC) 07/28/2020   BPH (benign prostatic hyperplasia) 07/05/2020   Left lower quadrant abdominal pain 11/28/2019   Lung nodule - stable 04/04/2019   History of DVT of lower extremity, b/l and PE 04/03/2019   History of pulmonary  embolus (PE) 04/03/2019   Nephrolithiasis 03/06/2019   Right low back pain 07/03/2018   Seasonal allergic rhinitis 05/22/2018   MR (mitral regurgitation) 12/20/2017   CKD (chronic kidney disease) stage 3, GFR 30-59 ml/min (HCC) 08/14/2017   Abdominal hernia without obstruction and without gangrene 01/10/2016   Chronic diastolic heart failure (HCC) 02/25/2015   Right renal mass 02/25/2015   Other constipation 02/22/2015   Chronic venous insufficiency 01/18/2015   Superficial phlebitis 01/18/2015   IBS (irritable bowel syndrome) 11/12/2013   Family history of prostate cancer 12/26/2011   Diabetes mellitus with renal complications (HCC) 12/18/2008   Gout 12/31/2007   HYPERLIPIDEMIA 08/14/2007   ED (erectile dysfunction) 05/09/2007   Essential hypertension 11/28/2006    PCP: Colene Dauphin, MD  REFERRING PROVIDER: Ulysees Gander, DO  REFERRING DIAG: Chronic right-sided low back pain without sciatica; Degeneration of intervertebral disc of lumbar region with discogenic back pain  Rationale for Evaluation and Treatment: Rehabilitation  THERAPY DIAG:  Other low back pain  Muscle weakness (generalized)  ONSET DATE: 09/03/2023   SUBJECTIVE:        SUBJECTIVE STATEMENT: Patient reports pain in the lower back that is mainly on the right side. The pain has been going for a few months now without any specific mechanism  of injury. It is very stiff in the mornings especially if he wakes up on the right side. As he moves throughout day the pain will improve.  He reports also that arching his back or just moving in certain directions can cause his pain. He denies any numbness/tingling or pain into his legs.  PERTINENT HISTORY:  See PMH above  PAIN:  Are you having pain? Yes:  NPRS scale: 0/10 at rest, 3-4/10 at worst mostly in morning Pain location: Lower back Pain description: Stiff, sore Aggravating factors: Pain worse in morning when going to straighten up, arching lower  back Relieving factors: Rest  PRECAUTIONS: None  RED FLAGS: None   WEIGHT BEARING RESTRICTIONS: No  FALLS:  Has patient fallen in last 6 months? No  PLOF: Independent  PATIENT GOALS: Pain relief   OBJECTIVE:  Note: Objective measures were completed at Evaluation unless otherwise noted. DIAGNOSTIC FINDINGS:  X-ray lumbar spine 11/14/2023 FINDINGS: There is no evidence of lumbar spine fracture. Mild curvature of spine. Multilevel facet joint sclerosis throughout the lumbar spine. Mild anterior spurring noted throughout the lumbar spine. Mild-to-moderate narrow intervertebral spaces in the lower lumbar spine more prominently involving the L4-5 and L5-S1 levels. IMPRESSION: Degenerative joint changes of lumbar spine.  PATIENT SURVEYS:  Modified Oswestry 6/50 (12% disability)   COGNITION: Overall cognitive status: Within functional limits for tasks assessed   SENSATION: WFL  MUSCLE LENGTH: Limitations with bilateral hamstring, hip flexor and quad  POSTURE:   Rounded shoulder and forward head posture  PALPATION: Mildly tender to right lumbar paraspinal region  LUMBAR ROM:   AROM eval  Flexion 50%  Extension 25%  Right lateral flexion 50%  Left lateral flexion 50%  Right rotation 50%  Left rotation 50%   (Blank rows = not tested)  LOWER EXTREMITY ROM:     Hip PROM grossly WFL except slight limitation with hip IR bilaterally   LOWER EXTREMITY MMT:    MMT Right eval Left eval  Hip flexion 4- 4-  Hip extension 3- 3  Hip abduction 3 3+  Hip adduction    Hip internal rotation    Hip external rotation    Knee flexion 5 5  Knee extension 5 5  Ankle dorsiflexion    Ankle plantarflexion    Ankle inversion    Ankle eversion     (Blank rows = not tested)  LUMBAR SPECIAL TESTS:  Radicular testing negative  FUNCTIONAL TESTS:  Lifting: patient demonstrates increased spinal flexion   GAIT: Assistive device utilized: None Level of assistance: Complete  Independence Comments: Grossly WFL   TREATMENT [facility] Adult PT Treatment:                                                DATE: 12/03/2023 LTR 5 x 5 sec Side clamshell with red 2 x 10 each Seated hamstring stretch 3 x 20 sec each  PATIENT EDUCATION:  Education details: Exam findings, POC, HEP Person educated: Patient Education method: Explanation, Demonstration, Tactile cues, Verbal cues, and Handouts Education comprehension: verbalized understanding, returned demonstration, verbal cues required, tactile cues required, and needs further education  HOME EXERCISE PROGRAM: Access Code: [email or web address]   ASSESSMENT: CLINICAL IMPRESSION: Patient is a 78 y.o. male who was seen today for physical therapy evaluation and treatment for chronic lower back pain. Currently he demonstrates limitations in his lumbar mobility, flexibility deficits,  and gross strength impairments of the core and hip musculature. His pain seems to be musculoskeletal in nature without any radicular symptoms this visit.  OBJECTIVE IMPAIRMENTS: decreased activity tolerance, decreased ROM, decreased strength, impaired flexibility, postural dysfunction, and pain.   ACTIVITY LIMITATIONS: lifting, bending, squatting, sleeping, and locomotion level  PARTICIPATION LIMITATIONS: meal prep, cleaning, community activity, and yard work  PERSONAL FACTORS: Fitness, Past/current experiences, and Time since onset of injury/illness/exacerbation are also affecting patient's functional outcome.   REHAB POTENTIAL: Good  CLINICAL DECISION MAKING: Stable/uncomplicated  EVALUATION COMPLEXITY: Low   GOALS: Goals reviewed with patient? Yes  SHORT TERM GOALS: Target date: 12/31/2023  Patient will be I with initial HEP in order to progress with therapy. Baseline: HEP provided at eval Goal status: INITIAL  2.  Patient will report lower back pain </= 2/10 in the mornings to reduce functional limitations Baseline: 3-4/10 pain Goal status:  INITIAL  LONG TERM GOALS: Target date: 01/28/2024  Patient will be I with final HEP to maintain progress from PT. Baseline: HEP provided at eval Goal status: INITIAL  2.  Patient will report Modified Oswestry </= 2/50 (4% disability) in order to indicate an improvement in their functional status Baseline: 6/50 (12% disability) Goal status: INITIAL  3.  Patient will exhibit lumbar AROM >/= 25% improvement in all planes of movement in order to reduce stiffness and improve mobility with household tasks Baseline: see limitations above Goal status: INITIAL  4.  Patient will demonstrate hip strength >/= 4-/5 MMT in order to improve activity tolerance Baseline: see limitations above Goal status: INITIAL   PLAN: PT FREQUENCY: 1x/week  PT DURATION: 8 weeks  PLANNED INTERVENTIONS: 97164- PT Re-evaluation, 97110-Therapeutic exercises, 97530- Therapeutic activity, 97112- Neuromuscular re-education, 97535- Self Care, 95284- Manual therapy, G0283- Electrical stimulation (unattended), Patient/Family education, Dry Needling, Joint mobilization, Joint manipulation, Spinal manipulation, Spinal mobilization, Cryotherapy, and Moist heat.  PLAN FOR NEXT SESSION: Review HEP and progress PRN, manual/stretching to improve lumbar mobility and address flexibility deficits, progress strengthening for the hips and core, lifting mechanics    Leah Primus, PT, DPT, LAT, ATC 12/03/23  12:46 PM Phone: [phone] Fax: [phone]    Referring diagnosis? M54.50  Treatment diagnosis? (if different than referring diagnosis) M54.59 What was this (referring dx) caused by? []  Surgery []  Fall [x]  Ongoing issue []  Arthritis []  Other: ____________  Laterality: []  Rt []  Lt [x]  Both  Check all possible CPT codes:  *CHOOSE 10 OR LESS*    See Planned Interventions listed in the Plan section of the Evaluation.

## 2023-12-17 DIAGNOSIS — N200 Calculus of kidney: Secondary | ICD-10-CM | POA: Diagnosis not present

## 2023-12-17 DIAGNOSIS — N281 Cyst of kidney, acquired: Secondary | ICD-10-CM | POA: Diagnosis not present

## 2023-12-19 ENCOUNTER — Encounter: Payer: Self-pay | Admitting: Physical Therapy

## 2023-12-19 ENCOUNTER — Other Ambulatory Visit: Payer: Self-pay

## 2023-12-19 ENCOUNTER — Ambulatory Visit: Admitting: Physical Therapy

## 2023-12-19 DIAGNOSIS — M5459 Other low back pain: Secondary | ICD-10-CM | POA: Diagnosis not present

## 2023-12-19 DIAGNOSIS — M6281 Muscle weakness (generalized): Secondary | ICD-10-CM | POA: Diagnosis not present

## 2023-12-19 NOTE — Patient Instructions (Signed)
 Access Code: [email or web address] URL: [email or web address] Date: 12/19/2023 Prepared by: Leah Primus  Exercises - Supine Lower Trunk Rotation  - 1 x daily - 2 sets - 10 reps - 5 seconds hold - Modified Thomas Stretch  - 2 x daily - 2 reps - 1 minute hold - Bridge  - 1 x daily - 2 sets - 10 reps - Clam with Resistance  - 1 x daily - 3 sets - 10 reps - Seated Hamstring Stretch  - 1 x daily - 3 reps - 20 seconds hold

## 2023-12-19 NOTE — Therapy (Signed)
 OUTPATIENT PHYSICAL THERAPY TREATMENT   Patient Name: Noah Barnes MRN: [id number] DOB:Jun 27, 1946, 78 y.o., male Today's Date: 12/19/2023   END OF SESSION:  PT End of Session - 12/19/23 0851     Visit Number 2    Number of Visits 9    Date for PT Re-Evaluation 01/28/24    Authorization Type [employer] MCR    Authorization Time Period 12/03/2023 - 01/28/2024    Authorization - Visit Number 2    Authorization - Number of Visits 8    Progress Note Due on Visit 10    PT Start Time 0845    PT Stop Time 0930    PT Time Calculation (min) 45 min    Activity Tolerance Patient tolerated treatment well    Behavior During Therapy [facility] for tasks assessed/performed           Past Medical History:  Diagnosis Date   CHF (congestive heart failure) (HCC)    Chronic diastolic heart failure (HCC)    Constipation    Diabetes mellitus    Diverticulitis    Diverticulosis    ED (erectile dysfunction)    Gout    Heart murmur    Hyperlipidemia    Hypertension    IBS (irritable bowel syndrome)    Renal calculus    Renal insufficiency    Tubular adenoma of colon    Past Surgical History:  Procedure Laterality Date   COLONOSCOPY  1998   negative; no F/U   EXTRACORPOREAL SHOCK WAVE LITHOTRIPSY Left 03/20/2019   Procedure: EXTRACORPOREAL SHOCK WAVE LITHOTRIPSY (ESWL);  Surgeon: Marco Severs, MD;  Location: [facility];  Service: Urology;  Laterality: Left;   knee effusion tapped     Patient Active Problem List   Diagnosis Date Noted   B12 deficiency 08/12/2023   Acute pain of both shoulders 10/31/2022   Right shoulder pain 10/31/2022   Right-sided chest pain 04/19/2021   Chest congestion 04/15/2021   Acquired trigger finger of right little finger 12/30/2020   Bilateral primary osteoarthritis of knee 12/30/2020   Aortic atherosclerosis (HCC) 12/29/2020   Chronic bronchitis (HCC) 07/28/2020   BPH (benign prostatic hyperplasia) 07/05/2020   Left lower quadrant abdominal pain 11/28/2019    Lung nodule - stable 04/04/2019   History of DVT of lower extremity, b/l and PE 04/03/2019   History of pulmonary embolus (PE) 04/03/2019   Nephrolithiasis 03/06/2019   Right low back pain 07/03/2018   Seasonal allergic rhinitis 05/22/2018   MR (mitral regurgitation) 12/20/2017   CKD (chronic kidney disease) stage 3, GFR 30-59 ml/min (HCC) 08/14/2017   Abdominal hernia without obstruction and without gangrene 01/10/2016   Chronic diastolic heart failure (HCC) 02/25/2015   Right renal mass 02/25/2015   Other constipation 02/22/2015   Chronic venous insufficiency 01/18/2015   Superficial phlebitis 01/18/2015   IBS (irritable bowel syndrome) 11/12/2013   Family history of prostate cancer 12/26/2011   Diabetes mellitus with renal complications (HCC) 12/18/2008   Gout 12/31/2007   HYPERLIPIDEMIA 08/14/2007   ED (erectile dysfunction) 05/09/2007   Essential hypertension 11/28/2006    PCP: Colene Dauphin, MD  REFERRING PROVIDER: Ulysees Gander, DO  REFERRING DIAG: Chronic right-sided low back pain without sciatica; Degeneration of intervertebral disc of lumbar region with discogenic back pain  Rationale for Evaluation and Treatment: Rehabilitation  THERAPY DIAG:  Other low back pain  Muscle weakness (generalized)  ONSET DATE: 09/03/2023   SUBJECTIVE:        SUBJECTIVE STATEMENT: Patient reports he is  doing well. He did get a back massager that also heats and has been helping quite a bit.   Eval: Patient reports pain in the lower back that is mainly on the right side. The pain has been going for a few months now without any specific mechanism of injury. It is very stiff in the mornings especially if he wakes up on the right side. As he moves throughout day the pain will improve.  He reports also that arching his back or just moving in certain directions can cause his pain. He denies any numbness/tingling or pain into his legs.  PERTINENT HISTORY:  See PMH above  PAIN:   Are you having pain? Yes:  NPRS scale: 0/10 at rest, 3-4/10 at worst mostly in morning Pain location: Lower back Pain description: Stiff, sore Aggravating factors: Pain worse in morning when going to straighten up, arching lower back Relieving factors: Rest  PRECAUTIONS: None  PATIENT GOALS: Pain relief   OBJECTIVE:  Note: Objective measures were completed at Evaluation unless otherwise noted. PATIENT SURVEYS:  Modified Oswestry 6/50 (12% disability)   MUSCLE LENGTH: Limitations with bilateral hamstring, hip flexor and quad  POSTURE:   Rounded shoulder and forward head posture  Increased lumbar lordosis  PALPATION: Mildly tender to right lumbar paraspinal region  LUMBAR ROM:   AROM eval  Flexion 50%  Extension 25%  Right lateral flexion 50%  Left lateral flexion 50%  Right rotation 50%  Left rotation 50%   (Blank rows = not tested)  LOWER EXTREMITY ROM:     Hip PROM grossly WFL except slight limitation with hip IR bilaterally   LOWER EXTREMITY MMT:    MMT Right eval Left eval  Hip flexion 4- 4-  Hip extension 3- 3  Hip abduction 3 3+  Hip adduction    Hip internal rotation    Hip external rotation    Knee flexion 5 5  Knee extension 5 5  Ankle dorsiflexion    Ankle plantarflexion    Ankle inversion    Ankle eversion     (Blank rows = not tested)  FUNCTIONAL TESTS:  Lifting: patient demonstrates increased spinal flexion   GAIT: Assistive device utilized: None Level of assistance: Complete Independence Comments: Grossly WFL   TREATMENT [facility] Adult PT Treatment:                                                DATE: 12/19/2023 Recumbent bike L4 x 5 min to improve endurance and workload capacity Seated hamstring stretch 3 x 20 sec each Slant board calf stretch 2 x 30 sec Modified thomas stretch 3 x 30 sec each LTR 10 x 5 sec SLR 2 x 10 each Bridge 2 x 10 Side clamshell with red 2 x 10 each Deadlift 15# from 6 box 3 x 10  PATIENT EDUCATION:   Education details: HEP update Person educated: Patient Education method: Explanation, Demonstration, Tactile cues, Verbal cues, and Handouts Education comprehension: verbalized understanding, returned demonstration, verbal cues required, tactile cues required, and needs further education  HOME EXERCISE PROGRAM: Access Code: [email or web address]   ASSESSMENT: CLINICAL IMPRESSION: Patient tolerated therapy well with no adverse effects. Therapy focused on improving his flexibility and lumbar mobility, and progressing some core and hip strengthening with good tolerance. He was able to progress in mat based hip and core exercises, and incorporated light lifting  with good tolerance. He does require extensive cueing for proper lifting technique. Updated his HEP to progress flexibility and strengthening exercises for home. Patient would benefit from continued skilled PT to progress mobility and strength in order to reduce pain and maximize functional ability.   Eval: Patient is a 78 y.o. male who was seen today for physical therapy evaluation and treatment for chronic lower back pain. Currently he demonstrates limitations in his lumbar mobility, flexibility deficits, and gross strength impairments of the core and hip musculature. His pain seems to be musculoskeletal in nature without any radicular symptoms this visit.  OBJECTIVE IMPAIRMENTS: decreased activity tolerance, decreased ROM, decreased strength, impaired flexibility, postural dysfunction, and pain.   ACTIVITY LIMITATIONS: lifting, bending, squatting, sleeping, and locomotion level  PARTICIPATION LIMITATIONS: meal prep, cleaning, community activity, and yard work  PERSONAL FACTORS: Fitness, Past/current experiences, and Time since onset of injury/illness/exacerbation are also affecting patient's functional outcome.    GOALS: Goals reviewed with patient? Yes  SHORT TERM GOALS: Target date: 12/31/2023  Patient will be I with initial HEP in order to  progress with therapy. Baseline: HEP provided at eval Goal status: INITIAL  2.  Patient will report lower back pain </= 2/10 in the mornings to reduce functional limitations Baseline: 3-4/10 pain Goal status: INITIAL  LONG TERM GOALS: Target date: 01/28/2024  Patient will be I with final HEP to maintain progress from PT. Baseline: HEP provided at eval Goal status: INITIAL  2.  Patient will report Modified Oswestry </= 2/50 (4% disability) in order to indicate an improvement in their functional status Baseline: 6/50 (12% disability) Goal status: INITIAL  3.  Patient will exhibit lumbar AROM >/= 25% improvement in all planes of movement in order to reduce stiffness and improve mobility with household tasks Baseline: see limitations above Goal status: INITIAL  4.  Patient will demonstrate hip strength >/= 4-/5 MMT in order to improve activity tolerance Baseline: see limitations above Goal status: INITIAL   PLAN: PT FREQUENCY: 1x/week  PT DURATION: 8 weeks  PLANNED INTERVENTIONS: 97164- PT Re-evaluation, 97110-Therapeutic exercises, 97530- Therapeutic activity, 97112- Neuromuscular re-education, 97535- Self Care, 16109- Manual therapy, G0283- Electrical stimulation (unattended), Patient/Family education, Dry Needling, Joint mobilization, Joint manipulation, Spinal manipulation, Spinal mobilization, Cryotherapy, and Moist heat.  PLAN FOR NEXT SESSION: Review HEP and progress PRN, manual/stretching to improve lumbar mobility and address flexibility deficits, progress strengthening for the hips and core, lifting mechanics    Leah Primus, PT, DPT, LAT, ATC 12/19/23  9:33 AM Phone: [phone] Fax: [phone]

## 2023-12-24 DIAGNOSIS — N1831 Chronic kidney disease, stage 3a: Secondary | ICD-10-CM | POA: Diagnosis not present

## 2023-12-25 LAB — LAB REPORT - SCANNED: EGFR: 45

## 2023-12-26 ENCOUNTER — Encounter: Payer: Self-pay | Admitting: Physical Therapy

## 2023-12-26 ENCOUNTER — Other Ambulatory Visit: Payer: Self-pay

## 2023-12-26 ENCOUNTER — Ambulatory Visit: Admitting: Physical Therapy

## 2023-12-26 DIAGNOSIS — M6281 Muscle weakness (generalized): Secondary | ICD-10-CM | POA: Diagnosis not present

## 2023-12-26 DIAGNOSIS — M5459 Other low back pain: Secondary | ICD-10-CM

## 2023-12-26 NOTE — Therapy (Signed)
 OUTPATIENT PHYSICAL THERAPY TREATMENT   Patient Name: Noah Barnes MRN: [id number] DOB:06-22-1946, 78 y.o., male Today's Date: 12/26/2023   END OF SESSION:  PT End of Session - 12/26/23 1035     Visit Number 3    Number of Visits 9    Date for PT Re-Evaluation 01/28/24    Authorization Type [employer] MCR    Authorization Time Period 12/03/2023 - 01/28/2024    Authorization - Visit Number 3    Authorization - Number of Visits 8    Progress Note Due on Visit 10    PT Start Time 1020    PT Stop Time 1100    PT Time Calculation (min) 40 min    Activity Tolerance Patient tolerated treatment well    Behavior During Therapy WFL for tasks assessed/performed            Past Medical History:  Diagnosis Date   CHF (congestive heart failure) (HCC)    Chronic diastolic heart failure (HCC)    Constipation    Diabetes mellitus    Diverticulitis    Diverticulosis    ED (erectile dysfunction)    Gout    Heart murmur    Hyperlipidemia    Hypertension    IBS (irritable bowel syndrome)    Renal calculus    Renal insufficiency    Tubular adenoma of colon    Past Surgical History:  Procedure Laterality Date   COLONOSCOPY  1998   negative; no F/U   EXTRACORPOREAL SHOCK WAVE LITHOTRIPSY Left 03/20/2019   Procedure: EXTRACORPOREAL SHOCK WAVE LITHOTRIPSY (ESWL);  Surgeon: Sherrilee Belvie CROME, MD;  Location: [facility];  Service: Urology;  Laterality: Left;   knee effusion tapped     Patient Active Problem List   Diagnosis Date Noted   B12 deficiency 08/12/2023   Acute pain of both shoulders 10/31/2022   Right shoulder pain 10/31/2022   Right-sided chest pain 04/19/2021   Chest congestion 04/15/2021   Acquired trigger finger of right little finger 12/30/2020   Bilateral primary osteoarthritis of knee 12/30/2020   Aortic atherosclerosis (HCC) 12/29/2020   Chronic bronchitis (HCC) 07/28/2020   BPH (benign prostatic hyperplasia) 07/05/2020   Left lower quadrant abdominal pain 11/28/2019    Lung nodule - stable 04/04/2019   History of DVT of lower extremity, b/l and PE 04/03/2019   History of pulmonary embolus (PE) 04/03/2019   Nephrolithiasis 03/06/2019   Right low back pain 07/03/2018   Seasonal allergic rhinitis 05/22/2018   MR (mitral regurgitation) 12/20/2017   CKD (chronic kidney disease) stage 3, GFR 30-59 ml/min (HCC) 08/14/2017   Abdominal hernia without obstruction and without gangrene 01/10/2016   Chronic diastolic heart failure (HCC) 02/25/2015   Right renal mass 02/25/2015   Other constipation 02/22/2015   Chronic venous insufficiency 01/18/2015   Superficial phlebitis 01/18/2015   IBS (irritable bowel syndrome) 11/12/2013   Family history of prostate cancer 12/26/2011   Diabetes mellitus with renal complications (HCC) 12/18/2008   Gout 12/31/2007   HYPERLIPIDEMIA 08/14/2007   ED (erectile dysfunction) 05/09/2007   Essential hypertension 11/28/2006    PCP: Geofm Glade PARAS, MD  REFERRING PROVIDER: Leonce Katz, DO  REFERRING DIAG: Chronic right-sided low back pain without sciatica; Degeneration of intervertebral disc of lumbar region with discogenic back pain  Rationale for Evaluation and Treatment: Rehabilitation  THERAPY DIAG:  Other low back pain  Muscle weakness (generalized)  ONSET DATE: 09/03/2023   SUBJECTIVE:        SUBJECTIVE STATEMENT: Patient reports he  is doing well. He did get a back massager that also heats and has been helping quite a bit.   Eval: Patient reports pain in the lower back that is mainly on the right side. The pain has been going for a few months now without any specific mechanism of injury. It is very stiff in the mornings especially if he wakes up on the right side. As he moves throughout day the pain will improve.  He reports also that arching his back or just moving in certain directions can cause his pain. He denies any numbness/tingling or pain into his legs.  PERTINENT HISTORY:  See PMH above  PAIN:   Are you having pain? Yes:  NPRS scale: 0/10 at rest, 3-4/10 at worst mostly in morning Pain location: Lower back Pain description: Stiff, sore Aggravating factors: Pain worse in morning when going to straighten up, arching lower back Relieving factors: Rest  PRECAUTIONS: None  PATIENT GOALS: Pain relief   OBJECTIVE:  Note: Objective measures were completed at Evaluation unless otherwise noted. PATIENT SURVEYS:  Modified Oswestry 6/50 (12% disability)   MUSCLE LENGTH: Limitations with bilateral hamstring, hip flexor and quad  POSTURE:   Rounded shoulder and forward head posture  Increased lumbar lordosis  PALPATION: Mildly tender to right lumbar paraspinal region  LUMBAR ROM:   AROM eval  Flexion 50%  Extension 25%  Right lateral flexion 50%  Left lateral flexion 50%  Right rotation 50%  Left rotation 50%   (Blank rows = not tested)  LOWER EXTREMITY ROM:     Hip PROM grossly WFL except slight limitation with hip IR bilaterally   LOWER EXTREMITY MMT:    MMT Right eval Left eval  Hip flexion 4- 4-  Hip extension 3- 3  Hip abduction 3 3+  Hip adduction    Hip internal rotation    Hip external rotation    Knee flexion 5 5  Knee extension 5 5  Ankle dorsiflexion    Ankle plantarflexion    Ankle inversion    Ankle eversion     (Blank rows = not tested)  FUNCTIONAL TESTS:  Lifting: patient demonstrates increased spinal flexion   GAIT: Assistive device utilized: None Level of assistance: Complete Independence Comments: Grossly WFL   TREATMENT [facility] Adult PT Treatment:                                                DATE: 12/26/2023 Recumbent bike L4 x 5 min to improve endurance and workload capacity Seated hamstring stretch 3 x 20 sec each LTR with legs crossed 3 x 5 sec each Bridge 2 x 10 Side clamshell with blue 2 x 15 each Deadlift 20# from 6 box 4 x 10 Pallof press with L2 powerband 2 x 10 each  PATIENT EDUCATION:  Education details:  HEP Person educated: Patient Education method: Explanation, Demonstration, Tactile cues, Verbal cues Education comprehension: verbalized understanding, returned demonstration, verbal cues required, tactile cues required, and needs further education  HOME EXERCISE PROGRAM: Access Code: [email or web address]   ASSESSMENT: CLINICAL IMPRESSION: Patient tolerated therapy well with no adverse effects. Therapy focused on improving lumbar mobility and progressing his strength with good tolerance. He was able to progress with his lifting using heavier weight and increase in reps this visit. No changes made to his HEP this visit. Patient would benefit from continued skilled PT  to progress mobility and strength in order to reduce pain and maximize functional ability.   Eval: Patient is a 78 y.o. male who was seen today for physical therapy evaluation and treatment for chronic lower back pain. Currently he demonstrates limitations in his lumbar mobility, flexibility deficits, and gross strength impairments of the core and hip musculature. His pain seems to be musculoskeletal in nature without any radicular symptoms this visit.  OBJECTIVE IMPAIRMENTS: decreased activity tolerance, decreased ROM, decreased strength, impaired flexibility, postural dysfunction, and pain.   ACTIVITY LIMITATIONS: lifting, bending, squatting, sleeping, and locomotion level  PARTICIPATION LIMITATIONS: meal prep, cleaning, community activity, and yard work  PERSONAL FACTORS: Fitness, Past/current experiences, and Time since onset of injury/illness/exacerbation are also affecting patient's functional outcome.    GOALS: Goals reviewed with patient? Yes  SHORT TERM GOALS: Target date: 12/31/2023  Patient will be I with initial HEP in order to progress with therapy. Baseline: HEP provided at eval Goal status: INITIAL  2.  Patient will report lower back pain </= 2/10 in the mornings to reduce functional limitations Baseline: 3-4/10  pain Goal status: INITIAL  LONG TERM GOALS: Target date: 01/28/2024  Patient will be I with final HEP to maintain progress from PT. Baseline: HEP provided at eval Goal status: INITIAL  2.  Patient will report Modified Oswestry </= 2/50 (4% disability) in order to indicate an improvement in their functional status Baseline: 6/50 (12% disability) Goal status: INITIAL  3.  Patient will exhibit lumbar AROM >/= 25% improvement in all planes of movement in order to reduce stiffness and improve mobility with household tasks Baseline: see limitations above Goal status: INITIAL  4.  Patient will demonstrate hip strength >/= 4-/5 MMT in order to improve activity tolerance Baseline: see limitations above Goal status: INITIAL   PLAN: PT FREQUENCY: 1x/week  PT DURATION: 8 weeks  PLANNED INTERVENTIONS: 97164- PT Re-evaluation, 97110-Therapeutic exercises, 97530- Therapeutic activity, 97112- Neuromuscular re-education, 97535- Self Care, 02859- Manual therapy, G0283- Electrical stimulation (unattended), Patient/Family education, Dry Needling, Joint mobilization, Joint manipulation, Spinal manipulation, Spinal mobilization, Cryotherapy, and Moist heat.  PLAN FOR NEXT SESSION: Review HEP and progress PRN, manual/stretching to improve lumbar mobility and address flexibility deficits, progress strengthening for the hips and core, lifting mechanics    Elaine Daring, PT, DPT, LAT, ATC 12/26/23  11:09 AM Phone: [phone] Fax: [phone]

## 2024-01-01 DIAGNOSIS — N2581 Secondary hyperparathyroidism of renal origin: Secondary | ICD-10-CM | POA: Diagnosis not present

## 2024-01-01 DIAGNOSIS — D631 Anemia in chronic kidney disease: Secondary | ICD-10-CM | POA: Diagnosis not present

## 2024-01-01 DIAGNOSIS — I129 Hypertensive chronic kidney disease with stage 1 through stage 4 chronic kidney disease, or unspecified chronic kidney disease: Secondary | ICD-10-CM | POA: Diagnosis not present

## 2024-01-01 DIAGNOSIS — N1831 Chronic kidney disease, stage 3a: Secondary | ICD-10-CM | POA: Diagnosis not present

## 2024-01-02 ENCOUNTER — Ambulatory Visit: Admitting: Physical Therapy

## 2024-01-02 ENCOUNTER — Ambulatory Visit: Payer: Medicare PPO

## 2024-01-02 ENCOUNTER — Encounter: Payer: Self-pay | Admitting: Physical Therapy

## 2024-01-02 ENCOUNTER — Other Ambulatory Visit: Payer: Self-pay

## 2024-01-02 VITALS — Ht 67.0 in | Wt 219.0 lb

## 2024-01-02 DIAGNOSIS — M6281 Muscle weakness (generalized): Secondary | ICD-10-CM

## 2024-01-02 DIAGNOSIS — Z0000 Encounter for general adult medical examination without abnormal findings: Secondary | ICD-10-CM | POA: Diagnosis not present

## 2024-01-02 DIAGNOSIS — M5459 Other low back pain: Secondary | ICD-10-CM | POA: Diagnosis not present

## 2024-01-02 NOTE — Therapy (Signed)
 OUTPATIENT PHYSICAL THERAPY TREATMENT   Patient Name: Noah Barnes MRN: [id number] DOB:[birth year], 78 y.o., male Today's Date: 01/02/2024   END OF SESSION:  PT End of Session - 01/02/24 0944     Visit Number 4    Number of Visits 9    Date for PT Re-Evaluation 01/28/24    Authorization Type [employer] M[employer]    Authorization Time Period 12/03/2023 - 01/28/2024    Authorization - Visit Number 4    Authorization - Number of Visits 8    Progress Note Due on Visit 10    PT Start Time 0930    PT Stop Time 1015    PT Time Calculation (min) 45 min    Activity Tolerance Patient tolerated treatment well    Behavior During Therapy [facility] for tasks assessed/performed             Past Medical History:  Diagnosis Date   CHF (congestive heart failure) (HCC)    Chronic diastolic heart failure (HCC)    Constipation    Diabetes mellitus    Diverticulitis    Diverticulosis    ED (erectile dysfunction)    Gout    Heart murmur    Hyperlipidemia    Hypertension    IBS (irritable bowel syndrome)    Renal calculus    Renal insufficiency    Tubular adenoma of colon    Past Surgical History:  Procedure Laterality Date   COLONOSCOPY  1998   negative; no F/U   EXTRACORPOREAL SHOCK WAVE LITHOTRIPSY Left 03/20/2019   Procedure: EXTRACORPOREAL SHOCK WAVE LITHOTRIPSY (ESWL);  Surgeon: Sherrilee Belvie CROME, MD;  Location: [facility];  Service: Urology;  Laterality: Left;   knee effusion tapped     Patient Active Problem List   Diagnosis Date Noted   B12 deficiency 08/12/2023   Acute pain of both shoulders 10/31/2022   Right shoulder pain 10/31/2022   Right-sided chest pain 04/19/2021   Chest congestion 04/15/2021   Acquired trigger finger of right little finger 12/30/2020   Bilateral primary osteoarthritis of knee 12/30/2020   Aortic atherosclerosis (HCC) 12/29/2020   Chronic bronchitis (HCC) 07/28/2020   BPH (benign prostatic hyperplasia) 07/05/2020   Left lower quadrant abdominal pain  11/28/2019   Lung nodule - stable 04/04/2019   History of DVT of lower extremity, b/l and PE 04/03/2019   History of pulmonary embolus (PE) 04/03/2019   Nephrolithiasis 03/06/2019   Right low back pain 07/03/2018   Seasonal allergic rhinitis 05/22/2018   MR (mitral regurgitation) 12/20/2017   CKD (chronic kidney disease) stage 3, GFR 30-59 ml/min (HCC) 08/14/2017   Abdominal hernia without obstruction and without gangrene 01/10/2016   Chronic diastolic heart failure (HCC) 02/25/2015   Right renal mass 02/25/2015   Other constipation 02/22/2015   Chronic venous insufficiency 01/18/2015   Superficial phlebitis 01/18/2015   IBS (irritable bowel syndrome) 11/12/2013   Family history of prostate cancer 12/26/2011   Diabetes mellitus with renal complications (HCC) 12/18/2008   Gout 12/31/2007   HYPERLIPIDEMIA 08/14/2007   ED (erectile dysfunction) 05/09/2007   Essential hypertension 11/28/2006    PCP: Geofm Glade PARAS, MD  REFERRING PROVIDER: Leonce Katz, DO  REFERRING DIAG: Chronic right-sided low back pain without sciatica; Degeneration of intervertebral disc of lumbar region with discogenic back pain  Rationale for Evaluation and Treatment: Rehabilitation  THERAPY DIAG:  Other low back pain  Muscle weakness (generalized)  ONSET DATE: 09/03/2023   SUBJECTIVE:        SUBJECTIVE STATEMENT: Patient reports  increased pain across his lower back since his last visit. He does not have any mechanism that increased the pain and it is across the lower back. He states he hasn't really been doing anything recently. He has been taking tylenol  which helps a little bit.   Eval: Patient reports pain in the lower back that is mainly on the right side. The pain has been going for a few months now without any specific mechanism of injury. It is very stiff in the mornings especially if he wakes up on the right side. As he moves throughout day the pain will improve.  He reports also that arching  his back or just moving in certain directions can cause his pain. He denies any numbness/tingling or pain into his legs.  PERTINENT HISTORY:  See PMH above  PAIN:  Are you having pain? Yes:  NPRS scale: 6/10 currently Pain location: Lower back Pain description: Stiff, sore Aggravating factors: Pain worse in morning when going to straighten up, arching lower back Relieving factors: Rest  PRECAUTIONS: None  PATIENT GOALS: Pain relief   OBJECTIVE:  Note: Objective measures were completed at Evaluation unless otherwise noted. PATIENT SURVEYS:  Modified Oswestry 6/50 (12% disability)   MUSCLE LENGTH: Limitations with bilateral hamstring, hip flexor and quad  POSTURE:   Rounded shoulder and forward head posture  Increased lumbar lordosis  PALPATION: Mildly tender to right lumbar paraspinal region  LUMBAR ROM:   AROM eval  Flexion 50%  Extension 25%  Right lateral flexion 50%  Left lateral flexion 50%  Right rotation 50%  Left rotation 50%   (Blank rows = not tested)  LOWER EXTREMITY ROM:    Hip PROM grossly WFL except slight limitation with hip IR bilaterally   LOWER EXTREMITY MMT:    MMT Right eval Left eval  Hip flexion 4- 4-  Hip extension 3- 3  Hip abduction 3 3+  Hip adduction    Hip internal rotation    Hip external rotation    Knee flexion 5 5  Knee extension 5 5  Ankle dorsiflexion    Ankle plantarflexion    Ankle inversion    Ankle eversion     (Blank rows = not tested)  FUNCTIONAL TESTS:  Lifting: patient demonstrates increased spinal flexion   GAIT: Assistive device utilized: None Level of assistance: Complete Independence Comments: Grossly WFL   TREATMENT [facility] Adult PT Treatment:                                                DATE: 01/02/2024 Recumbent bike L4 x 5 min to improve endurance and workload capacity SKTC stretch 3 x 20 sec each Piriformis stretch 3 x 20 sec each LTR 10 x 5 sec each Modified thomas stretch 2 x 60 sec  each Seated hamstring stretch 3 x 20 sec each Seated lumbar flexion stability ball roll out 5 x 10 sec  PATIENT EDUCATION:  Education details: HEP update Person educated: Patient Education method: Explanation, Demonstration, Tactile cues, Verbal cues, Handout Education comprehension: verbalized understanding, returned demonstration, verbal cues required, tactile cues required, and needs further education  HOME EXERCISE PROGRAM: Access Code: [email or web address]   ASSESSMENT: CLINICAL IMPRESSION: Patient tolerated therapy well with no adverse effects. He arrives reporting increased pain in his lower back this visit with no specific mechanism. Therapy focused primarily on stretching to improve his  lumbar mobility and his hip/LE flexibility with good tolerance. He did report feeling looser and less pain following therapy. Updated to his HEP to focus on his flexibility. Patient would benefit from continued skilled PT to progress mobility and strength in order to reduce pain and maximize functional ability.   Eval: Patient is a 78 y.o. male who was seen today for physical therapy evaluation and treatment for chronic lower back pain. Currently he demonstrates limitations in his lumbar mobility, flexibility deficits, and gross strength impairments of the core and hip musculature. His pain seems to be musculoskeletal in nature without any radicular symptoms this visit.  OBJECTIVE IMPAIRMENTS: decreased activity tolerance, decreased ROM, decreased strength, impaired flexibility, postural dysfunction, and pain.   ACTIVITY LIMITATIONS: lifting, bending, squatting, sleeping, and locomotion level  PARTICIPATION LIMITATIONS: meal prep, cleaning, community activity, and yard work  PERSONAL FACTORS: Fitness, Past/current experiences, and Time since onset of injury/illness/exacerbation are also affecting patient's functional outcome.    GOALS: Goals reviewed with patient? Yes  SHORT TERM GOALS: Target date:  12/31/2023  Patient will be I with initial HEP in order to progress with therapy. Baseline: HEP provided at eval 01/02/2024: independent with initial HEP Goal status: MET  2.  Patient will report lower back pain </= 2/10 in the mornings to reduce functional limitations Baseline: 3-4/10 pain 01/02/2024: patient reports increased pain this visit. Goal status: ONGOING  LONG TERM GOALS: Target date: 01/28/2024  Patient will be I with final HEP to maintain progress from PT. Baseline: HEP provided at eval Goal status: INITIAL  2.  Patient will report Modified Oswestry </= 2/50 (4% disability) in order to indicate an improvement in their functional status Baseline: 6/50 (12% disability) Goal status: INITIAL  3.  Patient will exhibit lumbar AROM >/= 25% improvement in all planes of movement in order to reduce stiffness and improve mobility with household tasks Baseline: see limitations above Goal status: INITIAL  4.  Patient will demonstrate hip strength >/= 4-/5 MMT in order to improve activity tolerance Baseline: see limitations above Goal status: INITIAL   PLAN: PT FREQUENCY: 1x/week  PT DURATION: 8 weeks  PLANNED INTERVENTIONS: 97164- PT Re-evaluation, 97110-Therapeutic exercises, 97530- Therapeutic activity, 97112- Neuromuscular re-education, 97535- Self Care, [id number]- Manual therapy, [id number]- Electrical stimulation (unattended), Patient/Family education, Dry Needling, Joint mobilization, Joint manipulation, Spinal manipulation, Spinal mobilization, Cryotherapy, and Moist heat.  PLAN FOR NEXT SESSION: Review HEP and progress PRN, manual/stretching to improve lumbar mobility and address flexibility deficits, progress strengthening for the hips and core, lifting mechanics    Elaine Daring, PT, DPT, LAT, ATC 01/02/24  10:56 AM Phone: [phone] Fax: [phone]

## 2024-01-02 NOTE — Patient Instructions (Signed)
 Access Code: [email or web address] URL: [email or web address] Date: 01/02/2024 Prepared by: Elaine Daring  Exercises - Hooklying Single Knee to Chest Stretch  - 1 x daily - 3 reps - 20 seconds hold - Supine Piriformis Stretch with Foot on Ground  - 1 x daily - 3 reps - 20 seconds hold - Supine Lower Trunk Rotation  - 1 x daily - 2 sets - 10 reps - 5 seconds hold - Modified Thomas Stretch  - 1 x daily - 2 reps - 1 minute hold - Seated Hamstring Stretch  - 1 x daily - 3 reps - 20 seconds hold - Seated Lumbar Flexion Stretch  - 1 x daily - 5 reps - 10 seconds hold

## 2024-01-02 NOTE — Progress Notes (Signed)
 Please attest and cosign this visit due to patients primary care provider not being in the office at the time the visit was completed.    Subjective:   Noah Barnes is a 78 y.o. who presents for a Medicare Wellness preventive visit.  As a reminder, Annual Wellness Visits don't include a physical exam, and some assessments may be limited, especially if this visit is performed virtually. We may recommend an in-person follow-up visit with your provider if needed.  Visit Complete: Virtual I connected with  Noah Barnes on 01/02/24 by a audio enabled telemedicine application and verified that I am speaking with the correct person using two identifiers.  Patient Location: Home  Provider Location: Office/Clinic  I discussed the limitations of evaluation and management by telemedicine. The patient expressed understanding and agreed to proceed.  Vital Signs: Because this visit was a virtual/telehealth visit, some criteria may be missing or patient reported. Any vitals not documented were not able to be obtained and vitals that have been documented are patient reported.  VideoDeclined- This patient declined Librarian, academic. Therefore the visit was completed with audio only.  Persons Participating in Visit: Patient.  AWV Questionnaire: No: Patient Medicare AWV questionnaire was not completed prior to this visit.  Cardiac Risk Factors include: advanced age (>55men, >39 women);diabetes mellitus;dyslipidemia;male gender;hypertension;sedentary lifestyle;obesity (BMI >30kg/m2)     Objective:    Today's Vitals   01/02/24 1054 01/02/24 1055  Weight: 219 lb (99.3 kg)   Height: 5' 7 (1.702 m)   PainSc:  3    Body mass index is 34.3 kg/m.     01/02/2024   11:04 AM 12/03/2023   10:30 AM 10/11/2023   10:54 AM 04/04/2023    9:51 AM 12/26/2022    3:17 PM 12/07/2022    9:34 AM 09/26/2022   10:08 AM  Advanced Directives  Does Patient Have a Medical Advance  Directive? No No No No No No No  Would patient like information on creating a medical advance directive?  No - Patient declined No - Patient declined No - Patient declined No - Patient declined No - Patient declined No - Patient declined    Current Medications (verified) Outpatient Encounter Medications as of 01/02/2024  Medication Sig   Accu-Chek Softclix Lancets lancets CHECK BLOOD SUGAR TWICE A DAY.   albuterol  (PROVENTIL ) (2.5 MG/3ML) 0.083% nebulizer solution Take 3 mLs (2.5 mg total) by nebulization every 6 (six) hours as needed for wheezing or shortness of breath.   albuterol  (VENTOLIN  HFA) 108 (90 Base) MCG/ACT inhaler Inhale 2 puffs into the lungs every 6 (six) hours as needed for wheezing or shortness of breath.   allopurinol  (ZYLOPRIM ) 300 MG tablet Take 1 tablet by mouth once daily   atorvastatin  (LIPITOR) 20 MG tablet Take 1 tablet by mouth once daily   Blood Glucose Monitoring Suppl (ACCU-CHEK GUIDE ME) w/Device KIT 1 each by Does not apply route in the morning and at bedtime. Use Accu Chek Guide me device to check blood sugar twice daily.   ELIQUIS  2.5 MG TABS tablet Take 1 tablet by mouth twice daily   FERREX 150 150 MG capsule Take 1 capsule by mouth once daily   furosemide  (LASIX ) 40 MG tablet Take 40 mg by mouth daily.   glimepiride  (AMARYL ) 1 MG tablet Take 1 tablet (1 mg total) by mouth daily with breakfast.   glucose blood (ACCU-CHEK GUIDE) test strip USE AS DIRECTED TO TEST BLOOD SUGAR TWICE DAILY   hydrALAZINE  (  APRESOLINE ) 50 MG tablet TAKE 1 TABLET BY MOUTH TWICE DAILY **ANNUAL  APPOINTMENT  DUE  IN  [place].  MUST  SEE  PROVIDER  FOR  FUTURE  REFILLS**   linaclotide  (LINZESS ) 290 MCG CAPS capsule Take 1 capsule (290 mcg total) by mouth daily before breakfast.   metoprolol  tartrate (LOPRESSOR ) 100 MG tablet Take 1 tablet (100 mg total) by mouth 2 (two) times daily.   Semaglutide  (RYBELSUS ) 14 MG TABS TAKE 1 TABLET BY MOUTH IN THE MORNING 30 MINUTES BEFORE BREAKFAST WITH SIP  OF WATER   sildenafil  (VIAGRA ) 100 MG tablet TAKE 1 TABLET BY MOUTH EVERY DAY AS NEEDED FOR ERECTILE DYSFUNCTION   tamsulosin  (FLOMAX ) 0.4 MG CAPS capsule TAKE 1 CAPSULE BY MOUTH ONCE DAILY AFTER SUPPER   Tiotropium Bromide Monohydrate  (SPIRIVA  RESPIMAT) 2.5 MCG/ACT AERS Inhale 2 puffs into the lungs daily.   tiZANidine  (ZANAFLEX ) 4 MG tablet Take 1 tablet (4 mg total) by mouth every 6 (six) hours as needed for muscle spasms.   Vitamin D, Ergocalciferol, (DRISDOL) 1.25 MG (50000 UNIT) CAPS capsule Take 50,000 Units by mouth once a week.   No facility-administered encounter medications on file as of 01/02/2024.    Allergies (verified) Ace inhibitors and Angiotensin receptor blockers   History: Past Medical History:  Diagnosis Date   CHF (congestive heart failure) (HCC)    Chronic diastolic heart failure (HCC)    Constipation    Diabetes mellitus    Diverticulitis    Diverticulosis    ED (erectile dysfunction)    Gout    Heart murmur    Hyperlipidemia    Hypertension    IBS (irritable bowel syndrome)    Renal calculus    Renal insufficiency    Tubular adenoma of colon    Past Surgical History:  Procedure Laterality Date   COLONOSCOPY  1998   negative; no F/U   EXTRACORPOREAL SHOCK WAVE LITHOTRIPSY Left 03/20/2019   Procedure: EXTRACORPOREAL SHOCK WAVE LITHOTRIPSY (ESWL);  Surgeon: Sherrilee Belvie CROME, MD;  Location: [facility];  Service: Urology;  Laterality: Left;   knee effusion tapped     Family History  Problem Relation Age of Onset   Heart attack Mother 68   Hypertension Mother    Prostate cancer Father 56   Diabetes Father    Irritable bowel syndrome Father    Irritable bowel syndrome Sister        x 2   Colon cancer Brother        dx in his late 40's   Heart disease Brother    Irritable bowel syndrome Brother    Stroke Neg Hx    Esophageal cancer Neg Hx    Rectal cancer Neg Hx    Stomach cancer Neg Hx    Social History   Socioeconomic History   Marital  status: Married    Spouse name: Not on file   Number of children: 1   Years of education: Not on file   Highest education level: Not on file  Occupational History   Occupation: Psychologist, counselling: [employer]    Comment: [phone]  Tobacco Use   Smoking status: Former    Current packs/day: 0.00    Average packs/day: 0.3 packs/day for 23.0 years (6.9 ttl pk-yrs)    Types: Cigarettes    Start date: 07/03/1968    Quit date: 07/04/1991    Years since quitting: 32.5   Smokeless tobacco: Never   Tobacco comments:  smoked age [age], up to 1/3 ppd; may be less  Vaping Use   Vaping status: Never Used  Substance and Sexual Activity   Alcohol use: Not Currently    Comment: rarely   Drug use: No   Sexual activity: Yes  Other Topics Concern   Not on file  Social History Narrative   ** Merged History Encounter **       Social Drivers of Health   Financial Resource Strain: Low Risk  (01/02/2024)   Overall Financial Resource Strain (CARDIA)    Difficulty of Paying Living Expenses: Not hard at all  Food Insecurity: No Food Insecurity (01/02/2024)   Hunger Vital Sign    Worried About Running Out of Food in the Last Year: Never true    Ran Out of Food in the Last Year: Never true  Transportation Needs: No Transportation Needs (01/02/2024)   PRAPARE - Administrator, Civil Service (Medical): No    Lack of Transportation (Non-Medical): No  Physical Activity: Inactive (01/02/2024)   Exercise Vital Sign    Days of Exercise per Week: 0 days    Minutes of Exercise per Session: 0 min  Stress: No Stress Concern Present (01/02/2024)   [employer] of Occupational Health - Occupational Stress Questionnaire    Feeling of Stress: Not at all  Social Connections: Socially Integrated (01/02/2024)   Social Connection and Isolation Panel    Frequency of Communication with Friends and Family: More than three times a week    Frequency of Social Gatherings with Friends  and Family: More than three times a week    Attends Religious Services: More than 4 times per year    Active Member of [employer] or Organizations: Yes    Attends Engineer, structural: More than 4 times per year    Marital Status: Married    Tobacco Counseling Counseling given: Not Answered Tobacco comments: smoked age [age], up to 1/3 ppd; may be less    Clinical Intake:  Pre-visit preparation completed: Yes  Pain : 0-10 Pain Score: 3  Pain Type: Acute pain Pain Location: Back Pain Orientation: Lower Pain Descriptors / Indicators: Aching Pain Onset: More than a month ago Pain Frequency: Intermittent Pain Relieving Factors: tylenol ,stretching  Pain Relieving Factors: tylenol ,stretching  BMI - recorded: 34.3 Nutritional Status: BMI > 30  Obese Nutritional Risks: None Diabetes: Yes CBG done?: No Did pt. bring in CBG monitor from home?: No  Lab Results  Component Value Date   HGBA1C 5.7 (A) 09/14/2023   HGBA1C 6.1 05/14/2023   HGBA1C 5.7 01/11/2023     How often do you need to have someone help you when you read instructions, pamphlets, or other written materials from your doctor or pharmacy?: 1 - Never  Interpreter Needed?: No  Comments: lives with wife Information entered by :: B.Razi Hickle,LPN   Activities of Daily Living     01/02/2024   11:05 AM  In your present state of health, do you have any difficulty performing the following activities:  Hearing? 0  Vision? 0  Difficulty concentrating or making decisions? 0  Walking or climbing stairs? 0  Dressing or bathing? 0  Doing errands, shopping? 0  Preparing Food and eating ? N  Using the Toilet? N  In the past six months, have you accidently leaked urine? N  Do you have problems with loss of bowel control? N  Managing your Medications? N  Managing your Finances? N  Housekeeping or managing your Housekeeping? N  Patient Care Team: Geofm Glade PARAS, MD as PCP - General (Internal Medicine) Wonda Sharper, MD as PCP - Cardiology (Cardiology) Harrietta Kurtz, OD as Consulting Physician (Optometry) Pyrtle, Gordy HERO, MD as Consulting Physician (Gastroenterology)  I have updated your Care Teams any recent Medical Services you may have received from other providers in the past year.     Assessment:   This is a routine wellness examination for Noah Barnes.  Hearing/Vision screen Hearing Screening - Comments:: Pt says no problems with his hearing Vision Screening - Comments:: Pt says his vision is good w/glasses Upcoming eye exam w/Dr.Poudyal   Goals Addressed               This Visit's Progress     Client understands the importance of follow-up with providers by attending scheduled visits (pt-stated)   On track     To stay alive      Exercise 3x per week (30 min per time)   Not on track     01/02/24-Will increase exercise to walking [facility]; Will continue toward weight loss goals      COMPLETED: Not to get bronchitis.        COMPLETED: Patient Stated        Try as much as possible to stay ahead of the constipation. Continue to take the medications to help and follow the instructions of Dr. Albertus.  Enjoy life, family and grand-daughter.      COMPLETED: Patient Stated        Continue to be physically and socially active.      COMPLETED: Patient Stated (pt-stated)        My goal is to get back in the gym.       Stay Healthy (pt-stated)   On track     01/02/24       Depression Screen     01/02/2024   11:01 AM 11/08/2023    9:50 AM 08/13/2023    8:30 AM 02/09/2023    9:04 AM 12/26/2022    3:15 PM 08/11/2022    8:20 AM 08/11/2022    8:10 AM  PHQ 2/9 Scores  PHQ - 2 Score 0 0 0 0 0 0 0  PHQ- 9 Score    0  0 0    Fall Risk     01/02/2024   10:57 AM 11/08/2023    9:50 AM 09/12/2023    8:17 AM 08/13/2023    8:30 AM 02/09/2023    9:04 AM  Fall Risk   Falls in the past year? 0 0 0 0 0  Number falls in past yr: 0 0 0 0 0  Injury with Fall? 0 0 0 0 0   Risk for fall due to : No Fall Risks No Fall Risks No Fall Risks No Fall Risks No Fall Risks  Follow up Falls prevention discussed;Education provided Falls evaluation completed Falls evaluation completed Falls evaluation completed Falls evaluation completed    MEDICARE RISK AT HOME:  Medicare Risk at Home Any stairs in or around the home?: Yes If so, are there any without handrails?: Yes Home free of loose throw rugs in walkways, pet beds, electrical cords, etc?: Yes Adequate lighting in your home to reduce risk of falls?: Yes Life alert?: No Use of a cane, walker or w/c?: No Grab bars in the bathroom?: Yes Shower chair or bench in shower?: No Elevated toilet seat or a handicapped toilet?: No  TIMED UP AND GO:  Was the test performed?  No  Cognitive Function: 6CIT completed    08/08/2017   10:24 AM 12/15/2014    9:19 AM  MMSE - Mini Mental State Exam  Not completed:  Unable to complete  Orientation to time 5    Orientation to Place 5    Registration 3    Attention/ Calculation 5    Recall 3    Language- name 2 objects 2    Language- repeat 1   Language- follow 3 step command 3    Language- read & follow direction 1    Write a sentence 1    Copy design 1    Total score 30       Data saved with a previous flowsheet row definition        01/02/2024   11:06 AM 12/26/2022    3:17 PM 01/16/2022    2:09 PM 09/29/2019   12:22 PM  6CIT Screen  What Year? 0 points 0 points 0 points 0 points  What month? 0 points 0 points 0 points 0 points  What time? 0 points 0 points 0 points 0 points  Count back from 20 0 points 0 points 0 points 0 points  Months in reverse 0 points 0 points 0 points 0 points  Repeat phrase 0 points 0 points 0 points 0 points  Total Score 0 points 0 points 0 points 0 points    Immunizations Immunization History  Administered Date(s) Administered   Fluad Quad(high Dose 65+) 03/06/2019, 05/05/2020, 03/26/2021, 04/04/2022   Fluad Trivalent(High Dose 65+)  03/19/2023   Influenza, High Dose Seasonal PF 07/12/2016, 04/10/2017, 03/22/2018   PFIZER Comirnaty (Gray Top)Covid-19 Tri-Sucrose Vaccine 01/14/2021   PFIZER(Purple Top)SARS-COV-2 Vaccination 08/13/2019, 09/10/2019, 04/06/2020   Pfizer Covid-19 Vaccine Bivalent Booster 34yrs & up 06/08/2021   Pfizer(Comirnaty )Fall Seasonal Vaccine 12 years and older 04/19/2022, 03/26/2023   Pneumococcal Conjugate-13 01/06/2019   Pneumococcal Polysaccharide-23 03/01/2020    Screening Tests Health Maintenance  Topic Date Due   DTaP/Tdap/Td (1 - Tdap) Never done   Zoster Vaccines- Shingrix (1 of 2) Never done   Diabetic kidney evaluation - Urine ACR  02/26/2011   OPHTHALMOLOGY EXAM  01/27/2023   COVID-19 Vaccine (8 - 2024-25 season) 09/23/2023   FOOT EXAM  01/16/2024   INFLUENZA VACCINE  02/01/2024   HEMOGLOBIN A1C  03/16/2024   Diabetic kidney evaluation - eGFR measurement  10/10/2024   Medicare Annual Wellness (AWV)  01/01/2025   Colonoscopy  02/22/2025   Pneumococcal Vaccine: 50+ Years  Completed   Hepatitis C Screening  Completed   Hepatitis B Vaccines  Aged Out   HPV VACCINES  Aged Out   Meningococcal B Vaccine  Aged Out    Health Maintenance  Health Maintenance Due  Topic Date Due   DTaP/Tdap/Td (1 - Tdap) Never done   Zoster Vaccines- Shingrix (1 of 2) Never done   Diabetic kidney evaluation - Urine ACR  02/26/2011   OPHTHALMOLOGY EXAM  01/27/2023   COVID-19 Vaccine (8 - 2024-25 season) 09/23/2023   Health Maintenance Items Addressed: none  Additional Screening:  Vision Screening: Recommended annual ophthalmology exams for early detection of glaucoma and other disorders of the eye. Would you like a referral to an eye doctor? No    Dental Screening: Recommended annual dental exams for proper oral hygiene  Community Resource Referral / Chronic Care Management: CRR required this visit?  No   CCM required this visit?  No   Plan:    I have personally  reviewed and noted the  following in the patient's chart:   Medical and social history Use of alcohol, tobacco or illicit drugs  Current medications and supplements including opioid prescriptions. Patient is not currently taking opioid prescriptions. Functional ability and status Nutritional status Physical activity Advanced directives List of other physicians Hospitalizations, surgeries, and ER visits in previous 12 months Vitals Screenings to include cognitive, depression, and falls Referrals and appointments  In addition, I have reviewed and discussed with patient certain preventive protocols, quality metrics, and best practice recommendations. A written personalized care plan for preventive services as well as general preventive health recommendations were provided to patient.   Noah LITTIE Saris, LPN   08/09/7972   After Visit Summary: (MyChart) Due to this being a telephonic visit, the after visit summary with patients personalized plan was offered to patient via MyChart   Notes: Nothing significant to report at this time.

## 2024-01-02 NOTE — Patient Instructions (Signed)
 [name] , Thank you for taking time out of your busy schedule to complete your Annual Wellness Visit with me. I enjoyed our conversation and look forward to speaking with you again next year. I, as well as your care team,  appreciate your ongoing commitment to your health goals. Please review the following plan we discussed and let me know if I can assist you in the future. Your Game plan/ To Do List    Follow up Visits: Next Medicare AWV with our clinical staff: 01/06/25 @ 10:50AM   Have you seen your provider in the last 6 months (3 months if uncontrolled diabetes)? Yes Next Office Visit with your provider: 02/14/24 physical  Clinician Recommendations:  Aim for 30 minutes of exercise or brisk walking, 6-8 glasses of water, and 5 servings of fruits and vegetables each day. i      This is a list of the screening recommended for you and due dates:  Health Maintenance  Topic Date Due   DTaP/Tdap/Td vaccine (1 - Tdap) Never done   Zoster (Shingles) Vaccine (1 of 2) Never done   Yearly kidney health urinalysis for diabetes  02/26/2011   Eye exam for diabetics  01/27/2023   COVID-19 Vaccine (8 - 2024-25 season) 09/23/2023   Complete foot exam   01/16/2024   Flu Shot  02/01/2024   Hemoglobin A1C  03/16/2024   Yearly kidney function blood test for diabetes  10/10/2024   Medicare Annual Wellness Visit  01/01/2025   Colon Cancer Screening  02/22/2025   Pneumococcal Vaccine for age over 43  Completed   Hepatitis C Screening  Completed   Hepatitis B Vaccine  Aged Out   HPV Vaccine  Aged Out   Meningitis B Vaccine  Aged Out    Advanced directives: (Declined) Advance directive discussed with you today. Even though you declined this today, please call our office should you change your mind, and we can give you the proper paperwork for you to fill out. Advance Care Planning is important because it:  [x]  Makes sure you receive the medical care that is consistent with your values, goals, and  preferences  [x]  It provides guidance to your family and loved ones and reduces their decisional burden about whether or not they are making the right decisions based on your wishes.  Follow the link provided in your after visit summary or read over the paperwork we have mailed to you to help you started getting your Advance Directives in place. If you need assistance in completing these, please reach out to us  so that we can help you!

## 2024-01-03 ENCOUNTER — Other Ambulatory Visit: Payer: Self-pay | Admitting: Endocrinology

## 2024-01-03 ENCOUNTER — Other Ambulatory Visit: Payer: Self-pay | Admitting: Hematology & Oncology

## 2024-01-03 DIAGNOSIS — E1169 Type 2 diabetes mellitus with other specified complication: Secondary | ICD-10-CM

## 2024-01-03 DIAGNOSIS — Z86711 Personal history of pulmonary embolism: Secondary | ICD-10-CM

## 2024-01-03 DIAGNOSIS — Z86718 Personal history of other venous thrombosis and embolism: Secondary | ICD-10-CM

## 2024-01-03 NOTE — Telephone Encounter (Signed)
 Refill request complete

## 2024-01-07 ENCOUNTER — Other Ambulatory Visit: Payer: Self-pay | Admitting: Internal Medicine

## 2024-01-07 ENCOUNTER — Other Ambulatory Visit: Payer: Self-pay

## 2024-01-08 ENCOUNTER — Other Ambulatory Visit: Payer: Self-pay

## 2024-01-08 DIAGNOSIS — E1165 Type 2 diabetes mellitus with hyperglycemia: Secondary | ICD-10-CM

## 2024-01-08 MED ORDER — ACCU-CHEK SOFTCLIX LANCETS MISC: 2 refills | Status: DC

## 2024-01-09 ENCOUNTER — Other Ambulatory Visit: Payer: Self-pay

## 2024-01-09 ENCOUNTER — Ambulatory Visit: Admitting: Physical Therapy

## 2024-01-09 ENCOUNTER — Encounter: Payer: Self-pay | Admitting: Physical Therapy

## 2024-01-09 DIAGNOSIS — M5459 Other low back pain: Secondary | ICD-10-CM

## 2024-01-09 DIAGNOSIS — M6281 Muscle weakness (generalized): Secondary | ICD-10-CM | POA: Diagnosis not present

## 2024-01-09 NOTE — Therapy (Signed)
 OUTPATIENT PHYSICAL THERAPY TREATMENT   Patient Name: Noah Barnes MRN: [id number] DOB:29-Jun-1946, 78 y.o., male Today's Date: 01/09/2024   END OF SESSION:  PT End of Session - 01/09/24 0929     Visit Number 5    Number of Visits 9    Date for PT Re-Evaluation 01/28/24    Authorization Type [employer] MCR    Authorization Time Period 12/03/2023 - 01/28/2024    Authorization - Visit Number 5    Authorization - Number of Visits 8    Progress Note Due on Visit 10    PT Start Time 0925    PT Stop Time 1005    PT Time Calculation (min) 40 min    Activity Tolerance Patient tolerated treatment well    Behavior During Therapy [facility] for tasks assessed/performed              Past Medical History:  Diagnosis Date   CHF (congestive heart failure) (HCC)    Chronic diastolic heart failure (HCC)    Constipation    Diabetes mellitus    Diverticulitis    Diverticulosis    ED (erectile dysfunction)    Gout    Heart murmur    Hyperlipidemia    Hypertension    IBS (irritable bowel syndrome)    Renal calculus    Renal insufficiency    Tubular adenoma of colon    Past Surgical History:  Procedure Laterality Date   COLONOSCOPY  1998   negative; no F/U   EXTRACORPOREAL SHOCK WAVE LITHOTRIPSY Left 03/20/2019   Procedure: EXTRACORPOREAL SHOCK WAVE LITHOTRIPSY (ESWL);  Surgeon: Sherrilee Belvie CROME, MD;  Location: [facility];  Service: Urology;  Laterality: Left;   knee effusion tapped     Patient Active Problem List   Diagnosis Date Noted   B12 deficiency 08/12/2023   Acute pain of both shoulders 10/31/2022   Right shoulder pain 10/31/2022   Right-sided chest pain 04/19/2021   Chest congestion 04/15/2021   Acquired trigger finger of right little finger 12/30/2020   Bilateral primary osteoarthritis of knee 12/30/2020   Aortic atherosclerosis (HCC) 12/29/2020   Chronic bronchitis (HCC) 07/28/2020   BPH (benign prostatic hyperplasia) 07/05/2020   Left lower quadrant abdominal pain  11/28/2019   Lung nodule - stable 04/04/2019   History of DVT of lower extremity, b/l and PE 04/03/2019   History of pulmonary embolus (PE) 04/03/2019   Nephrolithiasis 03/06/2019   Right low back pain 07/03/2018   Seasonal allergic rhinitis 05/22/2018   MR (mitral regurgitation) 12/20/2017   CKD (chronic kidney disease) stage 3, GFR 30-59 ml/min (HCC) 08/14/2017   Abdominal hernia without obstruction and without gangrene 01/10/2016   Chronic diastolic heart failure (HCC) 02/25/2015   Right renal mass 02/25/2015   Other constipation 02/22/2015   Chronic venous insufficiency 01/18/2015   Superficial phlebitis 01/18/2015   IBS (irritable bowel syndrome) 11/12/2013   Family history of prostate cancer 12/26/2011   Diabetes mellitus with renal complications (HCC) 12/18/2008   Gout 12/31/2007   HYPERLIPIDEMIA 08/14/2007   ED (erectile dysfunction) 05/09/2007   Essential hypertension 11/28/2006    PCP: Geofm Glade PARAS, MD  REFERRING PROVIDER: Leonce Katz, DO  REFERRING DIAG: Chronic right-sided low back pain without sciatica; Degeneration of intervertebral disc of lumbar region with discogenic back pain  Rationale for Evaluation and Treatment: Rehabilitation  THERAPY DIAG:  Other low back pain  Muscle weakness (generalized)  ONSET DATE: 09/03/2023   SUBJECTIVE:        SUBJECTIVE STATEMENT: Patient  reports his back may be a little better. He does go to get a massage monthly and feels that was helpful.  Eval: Patient reports pain in the lower back that is mainly on the right side. The pain has been going for a few months now without any specific mechanism of injury. It is very stiff in the mornings especially if he wakes up on the right side. As he moves throughout day the pain will improve.  He reports also that arching his back or just moving in certain directions can cause his pain. He denies any numbness/tingling or pain into his legs.  PERTINENT HISTORY:  See PMH  above  PAIN:  Are you having pain? Yes:  NPRS scale: 0/10 currently Pain location: Lower back Pain description: Stiff, sore Aggravating factors: Pain worse in morning when going to straighten up, arching lower back Relieving factors: Rest  PRECAUTIONS: None  PATIENT GOALS: Pain relief   OBJECTIVE:  Note: Objective measures were completed at Evaluation unless otherwise noted. PATIENT SURVEYS:  Modified Oswestry 6/50 (12% disability)   MUSCLE LENGTH: Limitations with bilateral hamstring, hip flexor and quad  POSTURE:   Rounded shoulder and forward head posture  Increased lumbar lordosis  PALPATION: Mildly tender to right lumbar paraspinal region  LUMBAR ROM:   AROM eval  Flexion 50%  Extension 25%  Right lateral flexion 50%  Left lateral flexion 50%  Right rotation 50%  Left rotation 50%   (Blank rows = not tested)  LOWER EXTREMITY ROM:    Hip PROM grossly WFL except slight limitation with hip IR bilaterally   LOWER EXTREMITY MMT:    MMT Right eval Left eval Rt / Lt 01/09/2024  Hip flexion 4- 4-   Hip extension 3- 3 3 / 3  Hip abduction 3 3+ 3 / 3+  Hip adduction     Hip internal rotation     Hip external rotation     Knee flexion 5 5   Knee extension 5 5   Ankle dorsiflexion     Ankle plantarflexion     Ankle inversion     Ankle eversion      (Blank rows = not tested)  FUNCTIONAL TESTS:  Lifting: patient demonstrates increased spinal flexion   GAIT: Assistive device utilized: None Level of assistance: Complete Independence Comments: Grossly WFL   TREATMENT [facility] Adult PT Treatment:                                                DATE: 01/09/2024 Recumbent bike L4 x 5 min to improve endurance and workload capacity Seated lumbar flexion stability ball roll out 10 x 5 sec Seated hamstring stretch 3 x 20 sec each LTR 10 x 5 sec each SKTC stretch 2 x 20 sec each Piriformis stretch 2 x 20 sec each Modified thomas stretch 2 x 30 sec each Deadlift  from 8 box 20# 3 x 10 Row with blue 3 x 10 Standing hip extension with green at knees 2 x 10 each  PATIENT EDUCATION:  Education details: HEP Person educated: Patient Education method: Programmer, multimedia, Demonstration, Tactile cues, Verbal cues Education comprehension: verbalized understanding, returned demonstration, verbal cues required, tactile cues required, and needs further education  HOME EXERCISE PROGRAM: Access Code: [email or web address]   ASSESSMENT: CLINICAL IMPRESSION: Patient tolerated therapy well with no adverse effects. Therapy continued focus on stretching to  improve lumbar mobility and reintroduction of strengthening for the posterior chain with good tolerance. He does continue to exhibit gross strength deficits of his core and hips. He did report feeling looser and better following therapy. No changes made to his HEP this visit. Patient would benefit from continued skilled PT to progress mobility and strength in order to reduce pain and maximize functional ability.   Eval: Patient is a 78 y.o. male who was seen today for physical therapy evaluation and treatment for chronic lower back pain. Currently he demonstrates limitations in his lumbar mobility, flexibility deficits, and gross strength impairments of the core and hip musculature. His pain seems to be musculoskeletal in nature without any radicular symptoms this visit.  OBJECTIVE IMPAIRMENTS: decreased activity tolerance, decreased ROM, decreased strength, impaired flexibility, postural dysfunction, and pain.   ACTIVITY LIMITATIONS: lifting, bending, squatting, sleeping, and locomotion level  PARTICIPATION LIMITATIONS: meal prep, cleaning, community activity, and yard work  PERSONAL FACTORS: Fitness, Past/current experiences, and Time since onset of injury/illness/exacerbation are also affecting patient's functional outcome.    GOALS: Goals reviewed with patient? Yes  SHORT TERM GOALS: Target date: 12/31/2023  Patient will be  I with initial HEP in order to progress with therapy. Baseline: HEP provided at eval 01/02/2024: independent with initial HEP Goal status: MET  2.  Patient will report lower back pain </= 2/10 in the mornings to reduce functional limitations Baseline: 3-4/10 pain 01/02/2024: patient reports increased pain this visit. Goal status: ONGOING  LONG TERM GOALS: Target date: 01/28/2024  Patient will be I with final HEP to maintain progress from PT. Baseline: HEP provided at eval Goal status: INITIAL  2.  Patient will report Modified Oswestry </= 2/50 (4% disability) in order to indicate an improvement in their functional status Baseline: 6/50 (12% disability) Goal status: INITIAL  3.  Patient will exhibit lumbar AROM >/= 25% improvement in all planes of movement in order to reduce stiffness and improve mobility with household tasks Baseline: see limitations above Goal status: INITIAL  4.  Patient will demonstrate hip strength >/= 4-/5 MMT in order to improve activity tolerance Baseline: see limitations above Goal status: INITIAL   PLAN: PT FREQUENCY: 1x/week  PT DURATION: 8 weeks  PLANNED INTERVENTIONS: 97164- PT Re-evaluation, 97110-Therapeutic exercises, 97530- Therapeutic activity, 97112- Neuromuscular re-education, 97535- Self Care, 02859- Manual therapy, G0283- Electrical stimulation (unattended), Patient/Family education, Dry Needling, Joint mobilization, Joint manipulation, Spinal manipulation, Spinal mobilization, Cryotherapy, and Moist heat.  PLAN FOR NEXT SESSION: Review HEP and progress PRN, manual/stretching to improve lumbar mobility and address flexibility deficits, progress strengthening for the hips and core, lifting mechanics    Elaine Daring, PT, DPT, LAT, ATC 01/09/24  10:12 AM Phone: [phone] Fax: [phone]

## 2024-01-10 ENCOUNTER — Telehealth: Payer: Self-pay | Admitting: Endocrinology

## 2024-01-10 ENCOUNTER — Other Ambulatory Visit: Payer: Self-pay

## 2024-01-10 DIAGNOSIS — E1165 Type 2 diabetes mellitus with hyperglycemia: Secondary | ICD-10-CM

## 2024-01-10 MED ORDER — GLIMEPIRIDE 1 MG PO TABS: 1.0000 mg | ORAL_TABLET | Freq: Every day | ORAL | 0 refills | Status: DC

## 2024-01-10 MED ORDER — ACCU-CHEK SOFTCLIX LANCETS MISC: 2 refills | Status: AC

## 2024-01-10 NOTE — Telephone Encounter (Signed)
 Glimepiride  refill request complete. Patient made aware via Mychart message.

## 2024-01-10 NOTE — Telephone Encounter (Signed)
 Patient called to say that the prescription for glimepiride  (AMARYL ) 1 MG tablet needs to go to [facility] [address] [place] - [address] (Ph: [phone])  and NOT to   [id number] [place], [place] - [address] AT [facility] [address] (Ph: [phone])

## 2024-01-10 NOTE — Telephone Encounter (Signed)
 Patient called this morning stating his pharmacy has faxed over a refill request for glimepiride  (AMARYL ) RX and have gotten no response.Patient states he has about 1 day's worth of his medication left.Please advise,his contact info is [phone]

## 2024-01-10 NOTE — Telephone Encounter (Signed)
 Medication resent to pharmacy of choice.

## 2024-01-10 NOTE — Telephone Encounter (Signed)
 Patient called and needs his RX glimepiride  (AMARYL ) 1 MG tablet  sent to the pharmacy at [facility] on [facility] [address] [place], [place], [place] [address].Patient said the RX was sent to the wrong pharmacy originally.

## 2024-01-18 ENCOUNTER — Encounter: Payer: Self-pay | Admitting: Physical Therapy

## 2024-01-18 ENCOUNTER — Other Ambulatory Visit: Payer: Self-pay

## 2024-01-18 ENCOUNTER — Ambulatory Visit: Admitting: Physical Therapy

## 2024-01-18 DIAGNOSIS — M6281 Muscle weakness (generalized): Secondary | ICD-10-CM

## 2024-01-18 DIAGNOSIS — M5459 Other low back pain: Secondary | ICD-10-CM | POA: Diagnosis not present

## 2024-01-18 NOTE — Therapy (Signed)
 OUTPATIENT PHYSICAL THERAPY TREATMENT   Patient Name: Noah Barnes MRN: [id number] DOB:[birth year], 78 y.o., male Today's Date: 01/18/2024   END OF SESSION:  PT End of Session - 01/18/24 0759     Visit Number 6    Number of Visits 9    Date for PT Re-Evaluation 01/28/24    Authorization Type [employer] MCR    Authorization Time Period 12/03/2023 - 01/28/2024    Authorization - Visit Number 6    Authorization - Number of Visits 8    Progress Note Due on Visit 10    PT Start Time 0800    PT Stop Time 0840    PT Time Calculation (min) 40 min    Activity Tolerance Patient tolerated treatment well    Behavior During Therapy [facility] for tasks assessed/performed               Past Medical History:  Diagnosis Date   CHF (congestive heart failure) (HCC)    Chronic diastolic heart failure (HCC)    Constipation    Diabetes mellitus    Diverticulitis    Diverticulosis    ED (erectile dysfunction)    Gout    Heart murmur    Hyperlipidemia    Hypertension    IBS (irritable bowel syndrome)    Renal calculus    Renal insufficiency    Tubular adenoma of colon    Past Surgical History:  Procedure Laterality Date   COLONOSCOPY  1998   negative; no F/U   EXTRACORPOREAL SHOCK WAVE LITHOTRIPSY Left 03/20/2019   Procedure: EXTRACORPOREAL SHOCK WAVE LITHOTRIPSY (ESWL);  Surgeon: Sherrilee Belvie CROME, MD;  Location: [facility];  Service: Urology;  Laterality: Left;   knee effusion tapped     Patient Active Problem List   Diagnosis Date Noted   B12 deficiency 08/12/2023   Acute pain of both shoulders 10/31/2022   Right shoulder pain 10/31/2022   Right-sided chest pain 04/19/2021   Chest congestion 04/15/2021   Acquired trigger finger of right little finger 12/30/2020   Bilateral primary osteoarthritis of knee 12/30/2020   Aortic atherosclerosis (HCC) 12/29/2020   Chronic bronchitis (HCC) 07/28/2020   BPH (benign prostatic hyperplasia) 07/05/2020   Left lower quadrant abdominal pain  11/28/2019   Lung nodule - stable 04/04/2019   History of DVT of lower extremity, b/l and PE 04/03/2019   History of pulmonary embolus (PE) 04/03/2019   Nephrolithiasis 03/06/2019   Right low back pain 07/03/2018   Seasonal allergic rhinitis 05/22/2018   MR (mitral regurgitation) 12/20/2017   CKD (chronic kidney disease) stage 3, GFR 30-59 ml/min (HCC) 08/14/2017   Abdominal hernia without obstruction and without gangrene 01/10/2016   Chronic diastolic heart failure (HCC) 02/25/2015   Right renal mass 02/25/2015   Other constipation 02/22/2015   Chronic venous insufficiency 01/18/2015   Superficial phlebitis 01/18/2015   IBS (irritable bowel syndrome) 11/12/2013   Family history of prostate cancer 12/26/2011   Diabetes mellitus with renal complications (HCC) 12/18/2008   Gout 12/31/2007   HYPERLIPIDEMIA 08/14/2007   ED (erectile dysfunction) 05/09/2007   Essential hypertension 11/28/2006    PCP: Geofm Glade PARAS, MD  REFERRING PROVIDER: Leonce Katz, DO  REFERRING DIAG: Chronic right-sided low back pain without sciatica; Degeneration of intervertebral disc of lumbar region with discogenic back pain  Rationale for Evaluation and Treatment: Rehabilitation  THERAPY DIAG:  Other low back pain  Muscle weakness (generalized)  ONSET DATE: 09/03/2023   SUBJECTIVE:        SUBJECTIVE STATEMENT:  Patient reports some soreness on the right side of his chest. He has remained consistent with his exercises. His back is feeling alright today.  Eval: Patient reports pain in the lower back that is mainly on the right side. The pain has been going for a few months now without any specific mechanism of injury. It is very stiff in the mornings especially if he wakes up on the right side. As he moves throughout day the pain will improve.  He reports also that arching his back or just moving in certain directions can cause his pain. He denies any numbness/tingling or pain into his  legs.  PERTINENT HISTORY:  See PMH above  PAIN:  Are you having pain? Yes:  NPRS scale: 0/10 currently Pain location: Lower back Pain description: Stiff, sore Aggravating factors: Pain worse in morning when going to straighten up, arching lower back Relieving factors: Rest  PRECAUTIONS: None  PATIENT GOALS: Pain relief   OBJECTIVE:  Note: Objective measures were completed at Evaluation unless otherwise noted. PATIENT SURVEYS:  Modified Oswestry 6/50 (12% disability)   MUSCLE LENGTH: Limitations with bilateral hamstring, hip flexor and quad  POSTURE:   Rounded shoulder and forward head posture  Increased lumbar lordosis  PALPATION: Mildly tender to right lumbar paraspinal region  LUMBAR ROM:   AROM eval  Flexion 50%  Extension 25%  Right lateral flexion 50%  Left lateral flexion 50%  Right rotation 50%  Left rotation 50%   (Blank rows = not tested)  LOWER EXTREMITY ROM:    Hip PROM grossly WFL except slight limitation with hip IR bilaterally   LOWER EXTREMITY MMT:    MMT Right eval Left eval Rt / Lt 01/09/2024  Hip flexion 4- 4-   Hip extension 3- 3 3 / 3  Hip abduction 3 3+ 3 / 3+  Hip adduction     Hip internal rotation     Hip external rotation     Knee flexion 5 5   Knee extension 5 5   Ankle dorsiflexion     Ankle plantarflexion     Ankle inversion     Ankle eversion      (Blank rows = not tested)  FUNCTIONAL TESTS:  Lifting: patient demonstrates increased spinal flexion   GAIT: Assistive device utilized: None Level of assistance: Complete Independence Comments: Grossly WFL   TREATMENT [facility] Adult PT Treatment:                                                DATE: 01/18/2024 Recumbent bike L4 x 5 min to improve endurance and workload capacity Seated lumbar flexion stability ball roll out fwd 5 x 5 sec, diagonal 5 x 5 sec each Seated hamstring stretch 3 x 20 sec each LTR 10 x 5 sec each DKTC stretch with feet on stability ball 3 x 20  sec Piriformis stretch 2 x 20 sec each Modified thomas stretch 3 x 30 sec each Deadlift from 8 box 30# 3 x 10 Standing hip extension with green at knees 2 x 10 each Sit to stand 2 x 10  PATIENT EDUCATION:  Education details: HEP update Person educated: Patient Education method: Explanation, Demonstration, Tactile cues, Verbal cues, Handout Education comprehension: verbalized understanding, returned demonstration, verbal cues required, tactile cues required, and needs further education  HOME EXERCISE PROGRAM: Access Code: [email or web address]   ASSESSMENT:  CLINICAL IMPRESSION: Patient tolerated therapy well with no adverse effects. Therapy continued focus on stretching to improve lumbar mobility and progressing his strengthening exercises with good tolerance. He was able to progress in weight with his lifting this visit without any increase in low back pain. Updated his HEP to progress his LE strengthening with good tolerance. Patient would benefit from continued skilled PT to progress mobility and strength in order to reduce pain and maximize functional ability.   Eval: Patient is a 78 y.o. male who was seen today for physical therapy evaluation and treatment for chronic lower back pain. Currently he demonstrates limitations in his lumbar mobility, flexibility deficits, and gross strength impairments of the core and hip musculature. His pain seems to be musculoskeletal in nature without any radicular symptoms this visit.  OBJECTIVE IMPAIRMENTS: decreased activity tolerance, decreased ROM, decreased strength, impaired flexibility, postural dysfunction, and pain.   ACTIVITY LIMITATIONS: lifting, bending, squatting, sleeping, and locomotion level  PARTICIPATION LIMITATIONS: meal prep, cleaning, community activity, and yard work  PERSONAL FACTORS: Fitness, Past/current experiences, and Time since onset of injury/illness/exacerbation are also affecting patient's functional outcome.    GOALS: Goals  reviewed with patient? Yes  SHORT TERM GOALS: Target date: 12/31/2023  Patient will be I with initial HEP in order to progress with therapy. Baseline: HEP provided at eval 01/02/2024: independent with initial HEP Goal status: MET  2.  Patient will report lower back pain </= 2/10 in the mornings to reduce functional limitations Baseline: 3-4/10 pain 01/02/2024: patient reports increased pain this visit. Goal status: ONGOING  LONG TERM GOALS: Target date: 01/28/2024  Patient will be I with final HEP to maintain progress from PT. Baseline: HEP provided at eval Goal status: INITIAL  2.  Patient will report Modified Oswestry </= 2/50 (4% disability) in order to indicate an improvement in their functional status Baseline: 6/50 (12% disability) Goal status: INITIAL  3.  Patient will exhibit lumbar AROM >/= 25% improvement in all planes of movement in order to reduce stiffness and improve mobility with household tasks Baseline: see limitations above Goal status: INITIAL  4.  Patient will demonstrate hip strength >/= 4-/5 MMT in order to improve activity tolerance Baseline: see limitations above Goal status: INITIAL   PLAN: PT FREQUENCY: 1x/week  PT DURATION: 8 weeks  PLANNED INTERVENTIONS: 97164- PT Re-evaluation, 97110-Therapeutic exercises, 97530- Therapeutic activity, 97112- Neuromuscular re-education, 97535- Self Care, 02859- Manual therapy, G0283- Electrical stimulation (unattended), Patient/Family education, Dry Needling, Joint mobilization, Joint manipulation, Spinal manipulation, Spinal mobilization, Cryotherapy, and Moist heat.  PLAN FOR NEXT SESSION: Review HEP and progress PRN, manual/stretching to improve lumbar mobility and address flexibility deficits, progress strengthening for the hips and core, lifting mechanics    Elaine Daring, PT, DPT, LAT, ATC 01/18/24  8:44 AM Phone: [phone] Fax: [phone]

## 2024-01-18 NOTE — Patient Instructions (Signed)
 Access Code: [email or web address] URL: [email or web address] Date: 01/18/2024 Prepared by: Elaine Daring  Exercises - Hooklying Single Knee to Chest Stretch  - 1 x daily - 3 reps - 20 seconds hold - Supine Piriformis Stretch with Foot on Ground  - 1 x daily - 3 reps - 20 seconds hold - Supine Lower Trunk Rotation  - 1 x daily - 2 sets - 10 reps - 5 seconds hold - Modified Thomas Stretch  - 1 x daily - 2 reps - 1 minute hold - Seated Hamstring Stretch  - 1 x daily - 3 reps - 20 seconds hold - Seated Lumbar Flexion Stretch  - 1 x daily - 5 reps - 10 seconds hold - Sit to Stand with Arms Crossed  - 1 x daily - 3 sets - 10 reps

## 2024-01-21 ENCOUNTER — Encounter: Payer: Self-pay | Admitting: Internal Medicine

## 2024-01-21 ENCOUNTER — Ambulatory Visit: Admitting: Internal Medicine

## 2024-01-21 VITALS — BP 134/84 | HR 58 | Temp 97.7°F | Ht 67.0 in | Wt 223.0 lb

## 2024-01-21 DIAGNOSIS — I10 Essential (primary) hypertension: Secondary | ICD-10-CM | POA: Diagnosis not present

## 2024-01-21 DIAGNOSIS — R229 Localized swelling, mass and lump, unspecified: Secondary | ICD-10-CM | POA: Diagnosis not present

## 2024-01-21 NOTE — Assessment & Plan Note (Signed)
 Acute Noticed it last week-did initially get larger and was very tender, but in the last several days it has decreased in size and is no longer tender Lump is still palpable Will get ultrasound Suspect possible lipoma versus cyst He will call if this changes prior to getting the ultrasound

## 2024-01-21 NOTE — Assessment & Plan Note (Signed)
Chronic Blood pressure well controlled Continue hydralazine 50 mg twice daily, metoprolol 100 mg twice daily

## 2024-01-21 NOTE — Progress Notes (Signed)
 Subjective:    Patient ID: Noah Barnes, male    DOB: April 29, 1946, 79 y.o.   MRN: [id number]      HPI Elman is here for  Chief Complaint  Patient presents with   Cyst    Patient notes a knot in the center of chest but has now gone down (noticed it last Wed/Thur) extremely sore to touch    Noticed it last Wednesday - got larger the next day.  It was very sore.  Moving his upper body caused pain.  It has gotten a little smaller and the pain has pretty much gone away.  He did not put anything on it.  Took some tylenol  when it was very sore.    No f/c.  He did feel a little dizzy for a few moments at one point.    Medications and allergies reviewed with patient and updated if appropriate.  Current Outpatient Medications on File Prior to Visit  Medication Sig Dispense Refill   Accu-Chek Softclix Lancets lancets CHECK BLOOD SUGAR TWICE A DAY. 100 each 2   albuterol  (PROVENTIL ) (2.5 MG/3ML) 0.083% nebulizer solution Take 3 mLs (2.5 mg total) by nebulization every 6 (six) hours as needed for wheezing or shortness of breath. 75 mL 5   albuterol  (VENTOLIN  HFA) 108 (90 Base) MCG/ACT inhaler Inhale 2 puffs into the lungs every 6 (six) hours as needed for wheezing or shortness of breath. 6.7 g 0   allopurinol  (ZYLOPRIM ) 300 MG tablet Take 1 tablet by mouth once daily 90 tablet 0   atorvastatin  (LIPITOR) 20 MG tablet Take 1 tablet by mouth once daily 90 tablet 0   Blood Glucose Monitoring Suppl (ACCU-CHEK GUIDE ME) w/Device KIT 1 each by Does not apply route in the morning and at bedtime. Use Accu Chek Guide me device to check blood sugar twice daily. 1 kit 0   ELIQUIS  2.5 MG TABS tablet Take 1 tablet by mouth twice daily 180 tablet 0   FERREX 150 150 MG capsule Take 1 capsule by mouth once daily 90 capsule 0   furosemide  (LASIX ) 40 MG tablet Take 40 mg by mouth daily.     glimepiride  (AMARYL ) 1 MG tablet Take 1 tablet (1 mg total) by mouth daily with breakfast. 90 tablet 0   glucose  blood (ACCU-CHEK GUIDE) test strip USE AS DIRECTED TO TEST BLOOD SUGAR TWICE DAILY 100 strip 3   hydrALAZINE  (APRESOLINE ) 50 MG tablet TAKE 1 TABLET BY MOUTH TWICE DAILY **ANNUAL  APPOINTMENT  DUE  IN  [place].  MUST  SEE  PROVIDER  FOR  FUTURE  REFILLS** 180 tablet 2   linaclotide  (LINZESS ) 290 MCG CAPS capsule Take 1 capsule (290 mcg total) by mouth daily before breakfast. 90 capsule 3   metoprolol  tartrate (LOPRESSOR ) 100 MG tablet Take 1 tablet (100 mg total) by mouth 2 (two) times daily. 180 tablet 1   Semaglutide  (RYBELSUS ) 14 MG TABS TAKE 1 TABLET BY MOUTH ONCE DAILY IN THE MORNING 30 MINUTES BEFORE BREAKFAST WITH A SIP OF WATER. 30 tablet 0   sildenafil  (VIAGRA ) 100 MG tablet TAKE 1 TABLET BY MOUTH EVERY DAY AS NEEDED FOR ERECTILE DYSFUNCTION 6 tablet 1   tamsulosin  (FLOMAX ) 0.4 MG CAPS capsule TAKE 1 CAPSULE BY MOUTH ONCE DAILY AFTER SUPPER 90 capsule 0   Tiotropium Bromide Monohydrate  (SPIRIVA  RESPIMAT) 2.5 MCG/ACT AERS Inhale 2 puffs into the lungs daily. 4 g 11   tiZANidine  (ZANAFLEX ) 4 MG tablet Take 1 tablet (4 mg total)  by mouth every 6 (six) hours as needed for muscle spasms. 30 tablet 0   Vitamin D, Ergocalciferol, (DRISDOL) 1.25 MG (50000 UNIT) CAPS capsule Take 50,000 Units by mouth once a week.     No current facility-administered medications on file prior to visit.    Review of Systems     Objective:   Vitals:   01/21/24 1020  BP: 134/84  Pulse: (!) 58  Temp: 97.7 F (36.5 C)  SpO2: 98%   BP Readings from Last 3 Encounters:  01/21/24 134/84  11/14/23 132/82  11/08/23 120/76   Wt Readings from Last 3 Encounters:  01/21/24 223 lb (101.2 kg)  01/02/24 219 lb (99.3 kg)  11/14/23 219 lb (99.3 kg)   Body mass index is 34.93 kg/m.    Physical Exam Constitutional:      General: He is not in acute distress.    Appearance: Normal appearance. He is not ill-appearing.  HENT:     Head: Normocephalic and atraumatic.  Skin:    General: Skin is warm and dry.      Findings: No bruising, erythema or rash.     Comments: Palpable skin month right upper chest overlying sternum-just less than 1 cm in diameter, mobile, nontender.  No overlying erythema, bruising, no visible wound  Neurological:     Mental Status: He is alert.            Assessment & Plan:    See Problem List for Assessment and Plan of chronic medical problems.

## 2024-01-21 NOTE — Patient Instructions (Addendum)
      An ultrasound of chest was ordered and someone will call you to schedule an appointment.     Return if symptoms worsen or fail to improve.

## 2024-01-23 ENCOUNTER — Ambulatory Visit (HOSPITAL_BASED_OUTPATIENT_CLINIC_OR_DEPARTMENT_OTHER)
Admission: RE | Admit: 2024-01-23 | Discharge: 2024-01-23 | Disposition: A | Discharge status: Home / Self Care | Source: Ambulatory Visit | Attending: Internal Medicine | Admitting: Internal Medicine

## 2024-01-23 DIAGNOSIS — R229 Localized swelling, mass and lump, unspecified: Secondary | ICD-10-CM | POA: Diagnosis not present

## 2024-01-23 DIAGNOSIS — R222 Localized swelling, mass and lump, trunk: Secondary | ICD-10-CM | POA: Diagnosis not present

## 2024-01-28 ENCOUNTER — Ambulatory Visit: Admitting: Physical Therapy

## 2024-01-28 ENCOUNTER — Ambulatory Visit: Payer: Self-pay | Admitting: Internal Medicine

## 2024-01-28 ENCOUNTER — Other Ambulatory Visit: Payer: Self-pay

## 2024-01-28 ENCOUNTER — Encounter: Payer: Self-pay | Admitting: Physical Therapy

## 2024-01-28 DIAGNOSIS — M5459 Other low back pain: Secondary | ICD-10-CM | POA: Diagnosis not present

## 2024-01-28 DIAGNOSIS — M6281 Muscle weakness (generalized): Secondary | ICD-10-CM

## 2024-01-28 NOTE — Patient Instructions (Signed)
 Access Code: [email or web address] URL: [email or web address] Date: 01/28/2024 Prepared by: Elaine Daring  Exercises - Hooklying Single Knee to Chest Stretch  - 1 x daily - 3 reps - 20 seconds hold - Supine Piriformis Stretch with Foot on Ground  - 1 x daily - 3 reps - 20 seconds hold - Supine Lower Trunk Rotation  - 1 x daily - 2 sets - 10 reps - 5 seconds hold - Modified Thomas Stretch  - 1 x daily - 2 reps - 1 minute hold - Seated Hamstring Stretch  - 1 x daily - 3 reps - 20 seconds hold - Seated Lumbar Flexion Stretch  - 1 x daily - 5 reps - 10 seconds hold - Sit to Stand with Arms Crossed  - 1 x daily - 3 sets - 10 reps - Standing Hip Extension with Counter Support  - 1 x daily - 3 sets - 10 reps - Standing Hip Abduction with Counter Support  - 1 x daily - 3 sets - 10 reps

## 2024-01-28 NOTE — Therapy (Signed)
 OUTPATIENT PHYSICAL THERAPY TREATMENT   Patient Name: Noah Barnes MRN: [id number] DOB:May 07, 1946, 78 y.o., male Today's Date: 01/28/2024   END OF SESSION:  PT End of Session - 01/28/24 0920     Visit Number 7    Number of Visits 15    Date for PT Re-Evaluation 03/24/24    Authorization Type [employer] MCR    Authorization Time Period 12/03/2023 - 01/28/2024    Authorization - Visit Number 7    Authorization - Number of Visits 8    Progress Note Due on Visit 10    PT Start Time 0846    PT Stop Time 0930    PT Time Calculation (min) 44 min    Activity Tolerance Patient tolerated treatment well    Behavior During Therapy [facility] for tasks assessed/performed                Past Medical History:  Diagnosis Date   CHF (congestive heart failure) (HCC)    Chronic diastolic heart failure (HCC)    Constipation    Diabetes mellitus    Diverticulitis    Diverticulosis    ED (erectile dysfunction)    Gout    Heart murmur    Hyperlipidemia    Hypertension    IBS (irritable bowel syndrome)    Renal calculus    Renal insufficiency    Tubular adenoma of colon    Past Surgical History:  Procedure Laterality Date   COLONOSCOPY  1998   negative; no F/U   EXTRACORPOREAL SHOCK WAVE LITHOTRIPSY Left 03/20/2019   Procedure: EXTRACORPOREAL SHOCK WAVE LITHOTRIPSY (ESWL);  Surgeon: Sherrilee Belvie CROME, MD;  Location: [facility];  Service: Urology;  Laterality: Left;   knee effusion tapped     Patient Active Problem List   Diagnosis Date Noted   Lump of skin 01/21/2024   B12 deficiency 08/12/2023   Acute pain of both shoulders 10/31/2022   Right shoulder pain 10/31/2022   Right-sided chest pain 04/19/2021   Chest congestion 04/15/2021   Acquired trigger finger of right little finger 12/30/2020   Bilateral primary osteoarthritis of knee 12/30/2020   Aortic atherosclerosis (HCC) 12/29/2020   Chronic bronchitis (HCC) 07/28/2020   BPH (benign prostatic hyperplasia) 07/05/2020   Left  lower quadrant abdominal pain 11/28/2019   Lung nodule - stable 04/04/2019   History of DVT of lower extremity, b/l and PE 04/03/2019   History of pulmonary embolus (PE) 04/03/2019   Nephrolithiasis 03/06/2019   Right low back pain 07/03/2018   Seasonal allergic rhinitis 05/22/2018   MR (mitral regurgitation) 12/20/2017   CKD (chronic kidney disease) stage 3, GFR 30-59 ml/min (HCC) 08/14/2017   Abdominal hernia without obstruction and without gangrene 01/10/2016   Chronic diastolic heart failure (HCC) 02/25/2015   Right renal mass 02/25/2015   Other constipation 02/22/2015   Chronic venous insufficiency 01/18/2015   Superficial phlebitis 01/18/2015   IBS (irritable bowel syndrome) 11/12/2013   Family history of prostate cancer 12/26/2011   Diabetes mellitus with renal complications (HCC) 12/18/2008   Gout 12/31/2007   HYPERLIPIDEMIA 08/14/2007   ED (erectile dysfunction) 05/09/2007   Essential hypertension 11/28/2006    PCP: Geofm Glade PARAS, MD  REFERRING PROVIDER: Leonce Katz, DO  REFERRING DIAG: Chronic right-sided low back pain without sciatica; Degeneration of intervertebral disc of lumbar region with discogenic back pain  Rationale for Evaluation and Treatment: Rehabilitation  THERAPY DIAG:  Other low back pain  Muscle weakness (generalized)  ONSET DATE: 09/03/2023   SUBJECTIVE:  SUBJECTIVE STATEMENT: Patient reports he gets most of his pain on the right side in the morning, it is difficult to straighten up.   Eval: Patient reports pain in the lower back that is mainly on the right side. The pain has been going for a few months now without any specific mechanism of injury. It is very stiff in the mornings especially if he wakes up on the right side. As he moves throughout day the pain will improve.  He reports also that arching his back or just moving in certain directions can cause his pain. He denies any numbness/tingling or pain into his legs.  PERTINENT  HISTORY:  See PMH above  PAIN:  Are you having pain? Yes:  NPRS scale: 0/10 currently Pain location: Lower back Pain description: Stiff, sore Aggravating factors: Pain worse in morning when going to straighten up, arching lower back Relieving factors: Rest  PRECAUTIONS: None  PATIENT GOALS: Pain relief   OBJECTIVE:  Note: Objective measures were completed at Evaluation unless otherwise noted. PATIENT SURVEYS:  Modified Oswestry 6/50 (12% disability)   01/28/2024: 3/60 (6%)  MUSCLE LENGTH: Limitations with bilateral hamstring, hip flexor and quad  POSTURE:   Rounded shoulder and forward head posture  Increased lumbar lordosis  PALPATION: Mildly tender to right lumbar paraspinal region  LUMBAR ROM:   AROM eval 01/28/2024  Flexion 50% 50%  Extension 25% 25%  Right lateral flexion 50% 50%  Left lateral flexion 50% 50%  Right rotation 50% 50%  Left rotation 50% 50%   (Blank rows = not tested)  LOWER EXTREMITY ROM:    Hip PROM grossly WFL except slight limitation with hip IR bilaterally   LOWER EXTREMITY MMT:    MMT Right eval Left eval Rt / Lt 01/09/2024 Rt / Lt 01/28/2024  Hip flexion 4- 4-  4- / 4-  Hip extension 3- 3 3 / 3 3 / 3  Hip abduction 3 3+ 3 / 3+ 3 / 3+  Hip adduction      Hip internal rotation      Hip external rotation      Knee flexion 5 5    Knee extension 5 5    Ankle dorsiflexion      Ankle plantarflexion      Ankle inversion      Ankle eversion       (Blank rows = not tested)  FUNCTIONAL TESTS:  Lifting: patient demonstrates increased spinal flexion   GAIT: Assistive device utilized: None Level of assistance: Complete Independence Comments: Grossly WFL   TREATMENT [facility] Adult PT Treatment:                                                DATE: 01/28/2024 Recumbent bike L4 x 5 min to improve endurance and workload capacity Seated lumbar flexion stability ball roll out fwd 5 x 5 sec, diagonal 5 x 5 sec each Seated hamstring stretch 3  x 20 sec each Sit to stand 2 x 10 LTR 10 x 5 sec each Piriformis stretch 3 x 20 sec each Modified thomas stretch 3 x 30 sec each Standing hip extension with green at knees 3 x 10 each Standing hip abduction with green at knees 3 x 10 each Deadlift from 8 box 30# 3 x 10  PATIENT EDUCATION:  Education details: HEP update Person educated: Patient Education method:  Explanation, Demonstration, Tactile cues, Verbal cues, Handout Education comprehension: verbalized understanding, returned demonstration, verbal cues required, tactile cues required, and needs further education  HOME EXERCISE PROGRAM: Access Code: [email or web address]   ASSESSMENT: CLINICAL IMPRESSION: Patient tolerated therapy well with no adverse effects. He continues to report right sided low back pain primarily in the morning, and he demonstrates continued limitation with lumbar mobility and gross core and hip strength. Therapy continues to focus on stretching for lumbar and hip mobility and progressing his strengthening exercises to improve his activity tolerance. He does report improvement in his functional status on the Modified Oswestry this visit. Updated his HEP to progress strengthening exercises for home. Patient would benefit from continued skilled PT to progress mobility and strength in order to reduce pain and maximize functional ability, so will extend his PT POC for 8 more weeks.   Eval: Patient is a 78 y.o. male who was seen today for physical therapy evaluation and treatment for chronic lower back pain. Currently he demonstrates limitations in his lumbar mobility, flexibility deficits, and gross strength impairments of the core and hip musculature. His pain seems to be musculoskeletal in nature without any radicular symptoms this visit.  OBJECTIVE IMPAIRMENTS: decreased activity tolerance, decreased ROM, decreased strength, impaired flexibility, postural dysfunction, and pain.   ACTIVITY LIMITATIONS: lifting, bending,  squatting, sleeping, and locomotion level  PARTICIPATION LIMITATIONS: meal prep, cleaning, community activity, and yard work  PERSONAL FACTORS: Fitness, Past/current experiences, and Time since onset of injury/illness/exacerbation are also affecting patient's functional outcome.    GOALS: Goals reviewed with patient? Yes  SHORT TERM GOALS: Target date: 02/25/2024  Patient will be I with initial HEP in order to progress with therapy. Baseline: HEP provided at eval 01/02/2024: independent with initial HEP Goal status: MET  2.  Patient will report lower back pain </= 2/10 in the mornings to reduce functional limitations Baseline: 3-4/10 pain 01/02/2024: patient reports increased pain this visit. 01/28/2024: continues to report increase pain in the morning Goal status: ONGOING  LONG TERM GOALS: Target date: 03/24/2024  Patient will be I with final HEP to maintain progress from PT. Baseline: HEP provided at eval 01/28/2024: progressing HEP Goal status: ONGOING  2.  Patient will report Modified Oswestry </= 2/50 (4% disability) in order to indicate an improvement in their functional status Baseline: 6/50 (12% disability) 01/28/2024: 3/60 (6%) Goal status: ONGOING  3.  Patient will exhibit lumbar AROM >/= 25% improvement in all planes of movement in order to reduce stiffness and improve mobility with household tasks Baseline: see limitations above 01/28/2024: continues to exhibit mobility deficits of the lumbar spine Goal status: ONGOING  4.  Patient will demonstrate hip strength >/= 4-/5 MMT in order to improve activity tolerance Baseline: see limitations above 01/28/2024: continues to exhibit strength deficits Goal status: ONGOING   PLAN: PT FREQUENCY: 1x/week  PT DURATION: 8 weeks  PLANNED INTERVENTIONS: 97164- PT Re-evaluation, 97110-Therapeutic exercises, 97530- Therapeutic activity, 97112- Neuromuscular re-education, 97535- Self Care, [id number]- Manual therapy, [id number]- Electrical  stimulation (unattended), Patient/Family education, Dry Needling, Joint mobilization, Joint manipulation, Spinal manipulation, Spinal mobilization, Cryotherapy, and Moist heat.  PLAN FOR NEXT SESSION: Review HEP and progress PRN, manual/stretching to improve lumbar mobility and address flexibility deficits, progress strengthening for the hips and core, lifting mechanics    Elaine Daring, PT, DPT, LAT, ATC 01/28/24  12:57 PM Phone: [phone] Fax: [phone]

## 2024-01-31 ENCOUNTER — Ambulatory Visit: Payer: Self-pay | Admitting: Endocrinology

## 2024-01-31 ENCOUNTER — Encounter: Payer: Self-pay | Admitting: Endocrinology

## 2024-01-31 ENCOUNTER — Ambulatory Visit: Admitting: Endocrinology

## 2024-01-31 VITALS — BP 120/60 | HR 62 | Resp 16 | Ht 67.0 in | Wt 226.0 lb

## 2024-01-31 DIAGNOSIS — H5203 Hypermetropia, bilateral: Secondary | ICD-10-CM | POA: Diagnosis not present

## 2024-01-31 DIAGNOSIS — Z7984 Long term (current) use of oral hypoglycemic drugs: Secondary | ICD-10-CM | POA: Diagnosis not present

## 2024-01-31 DIAGNOSIS — H524 Presbyopia: Secondary | ICD-10-CM | POA: Diagnosis not present

## 2024-01-31 DIAGNOSIS — H35033 Hypertensive retinopathy, bilateral: Secondary | ICD-10-CM | POA: Diagnosis not present

## 2024-01-31 DIAGNOSIS — H2513 Age-related nuclear cataract, bilateral: Secondary | ICD-10-CM | POA: Diagnosis not present

## 2024-01-31 DIAGNOSIS — E1165 Type 2 diabetes mellitus with hyperglycemia: Secondary | ICD-10-CM

## 2024-01-31 DIAGNOSIS — E119 Type 2 diabetes mellitus without complications: Secondary | ICD-10-CM | POA: Diagnosis not present

## 2024-01-31 LAB — POCT GLYCOSYLATED HEMOGLOBIN (HGB A1C): Hemoglobin A1C: 6 % — AB (ref 4.0–5.6)

## 2024-01-31 LAB — HM DIABETES EYE EXAM

## 2024-01-31 NOTE — Progress Notes (Signed)
 Outpatient Endocrinology Note Noah Fadi Menter, MD  01/31/24  Patient's Name: Noah Barnes    DOB: [birth year]    MRN: [id number]                                                    REASON OF VISIT: Follow up of type 2 diabetes mellitus  PCP: Geofm Glade PARAS, MD  HISTORY OF PRESENT ILLNESS:   Noah Barnes is a 78 y.o. old male with past medical history listed below, is here for follow up for type 2 diabetes mellitus.    Pertinent Diabetes History: Patient was previously seen by Dr. Von in July 2024.  Patient was diagnosed with type 2 diabetes mellitus in 2008.  Patient has controlled type 2 diabetes mellitus.  Chronic Diabetes Complications : Retinopathy: no. Last ophthalmology exam was done on 01/2023, following with ophthalmology regularly.  Nephropathy: CKD III, following with nephrology. Peripheral neuropathy: no Coronary artery disease: no Stroke: on  Relevant comorbidities and cardiovascular risk factors: Obesity: yes Body mass index is 35.4 kg/m.  Hypertension: Yes  Hyperlipidemia : Yes, on statin   Current / Home Diabetic regimen includes: Rybelsus  14 mg daily. Amaryl  1 mg daily with supper.  Prior diabetic medications: Metformin , Janumet , Invokana  in the past.  Glycemic data:    Accu-Chek guide glucometer, download from July 17 to January 31, 2024, average blood sugar 121, lowest blood sugar 93, highest blood sugar 167.  He has been taking mostly twice a day.  Fasting blood sugar mostly in low 100.  Blood sugar at bedtime from 120s to 140s range.  Hypoglycemia: Patient has no hypoglycemic episodes. Patient has hypoglycemia awareness.  Factors modifying glucose control: 1.  Diabetic diet assessment: 3 meals a day.  2.  Staying active or exercising: No formal exercise.  3.  Medication compliance: compliant all of the time.  Interval history  Glucometer data as reviewed above.  Mostly acceptable blood sugar.  Hemoglobin A1c 6% today.  Diabetes treatment has  reviewed as noted above.  He has been tolerating Rybelsus  well, denies any GI issues.  He has no other complaints today.  REVIEW OF SYSTEMS As per history of present illness.   PAST MEDICAL HISTORY: Past Medical History:  Diagnosis Date   CHF (congestive heart failure) (HCC)    Chronic diastolic heart failure (HCC)    Constipation    Diabetes mellitus    Diverticulitis    Diverticulosis    ED (erectile dysfunction)    Gout    Heart murmur    Hyperlipidemia    Hypertension    IBS (irritable bowel syndrome)    Renal calculus    Renal insufficiency    Tubular adenoma of colon     PAST SURGICAL HISTORY: Past Surgical History:  Procedure Laterality Date   COLONOSCOPY  1998   negative; no F/U   EXTRACORPOREAL SHOCK WAVE LITHOTRIPSY Left 03/20/2019   Procedure: EXTRACORPOREAL SHOCK WAVE LITHOTRIPSY (ESWL);  Surgeon: Sherrilee Belvie CROME, MD;  Location: [facility];  Service: Urology;  Laterality: Left;   knee effusion tapped      ALLERGIES: Allergies  Allergen Reactions   Ace Inhibitors Other (See Comments)    Severe AKI due to ARB + dehydration Aug 2016   Angiotensin Receptor Blockers Other (See Comments)    Severe AKI due to ARB + dehydration  Aug 2016    FAMILY HISTORY:  Family History  Problem Relation Age of Onset   Heart attack Mother 67   Hypertension Mother    Prostate cancer Father 39   Diabetes Father    Irritable bowel syndrome Father    Irritable bowel syndrome Sister        x 2   Colon cancer Brother        dx in his late 35's   Heart disease Brother    Irritable bowel syndrome Brother    Stroke Neg Hx    Esophageal cancer Neg Hx    Rectal cancer Neg Hx    Stomach cancer Neg Hx     SOCIAL HISTORY: Social History   Socioeconomic History   Marital status: Married    Spouse name: Not on file   Number of children: 1   Years of education: Not on file   Highest education level: Not on file  Occupational History   Occupation: Nutritional therapist: [employer]    Comment: [phone]  Tobacco Use   Smoking status: Former    Current packs/day: 0.00    Average packs/day: 0.3 packs/day for 23.0 years (6.9 ttl pk-yrs)    Types: Cigarettes    Start date: 07/03/1968    Quit date: 07/04/1991    Years since quitting: 32.6   Smokeless tobacco: Never   Tobacco comments:    smoked age [age], up to 1/3 ppd; may be less  Vaping Use   Vaping status: Never Used  Substance and Sexual Activity   Alcohol use: Not Currently    Comment: rarely   Drug use: No   Sexual activity: Yes  Other Topics Concern   Not on file  Social History Narrative   ** Merged History Encounter **       Social Drivers of Health   Financial Resource Strain: Low Risk  (01/02/2024)   Overall Financial Resource Strain (CARDIA)    Difficulty of Paying Living Expenses: Not hard at all  Food Insecurity: No Food Insecurity (01/02/2024)   Hunger Vital Sign    Worried About Running Out of Food in the Last Year: Never true    Ran Out of Food in the Last Year: Never true  Transportation Needs: No Transportation Needs (01/02/2024)   PRAPARE - Administrator, Civil Service (Medical): No    Lack of Transportation (Non-Medical): No  Physical Activity: Inactive (01/02/2024)   Exercise Vital Sign    Days of Exercise per Week: 0 days    Minutes of Exercise per Session: 0 min  Stress: No Stress Concern Present (01/02/2024)   [employer] of Occupational Health - Occupational Stress Questionnaire    Feeling of Stress: Not at all  Social Connections: Socially Integrated (01/02/2024)   Social Connection and Isolation Panel    Frequency of Communication with Friends and Family: More than three times a week    Frequency of Social Gatherings with Friends and Family: More than three times a week    Attends Religious Services: More than 4 times per year    Active Member of [employer] or Organizations: Yes    Attends Engineer, structural: More than 4  times per year    Marital Status: Married    MEDICATIONS:  Current Outpatient Medications  Medication Sig Dispense Refill   Accu-Chek Softclix Lancets lancets CHECK BLOOD SUGAR TWICE A DAY. 100 each 2   albuterol  (PROVENTIL ) (2.5  MG/3ML) 0.083% nebulizer solution Take 3 mLs (2.5 mg total) by nebulization every 6 (six) hours as needed for wheezing or shortness of breath. 75 mL 5   albuterol  (VENTOLIN  HFA) 108 (90 Base) MCG/ACT inhaler Inhale 2 puffs into the lungs every 6 (six) hours as needed for wheezing or shortness of breath. 6.7 g 0   allopurinol  (ZYLOPRIM ) 300 MG tablet Take 1 tablet by mouth once daily 90 tablet 0   atorvastatin  (LIPITOR) 20 MG tablet Take 1 tablet by mouth once daily 90 tablet 0   Blood Glucose Monitoring Suppl (ACCU-CHEK GUIDE ME) w/Device KIT 1 each by Does not apply route in the morning and at bedtime. Use Accu Chek Guide me device to check blood sugar twice daily. 1 kit 0   ELIQUIS  2.5 MG TABS tablet Take 1 tablet by mouth twice daily 180 tablet 0   FERREX 150 150 MG capsule Take 1 capsule by mouth once daily 90 capsule 0   furosemide  (LASIX ) 40 MG tablet Take 40 mg by mouth daily.     glimepiride  (AMARYL ) 1 MG tablet Take 1 tablet (1 mg total) by mouth daily with breakfast. 90 tablet 0   glucose blood (ACCU-CHEK GUIDE) test strip USE AS DIRECTED TO TEST BLOOD SUGAR TWICE DAILY 100 strip 3   hydrALAZINE  (APRESOLINE ) 50 MG tablet TAKE 1 TABLET BY MOUTH TWICE DAILY **ANNUAL  APPOINTMENT  DUE  IN  [place].  MUST  SEE  PROVIDER  FOR  FUTURE  REFILLS** 180 tablet 2   linaclotide  (LINZESS ) 290 MCG CAPS capsule Take 1 capsule (290 mcg total) by mouth daily before breakfast. 90 capsule 3   metoprolol  tartrate (LOPRESSOR ) 100 MG tablet Take 1 tablet (100 mg total) by mouth 2 (two) times daily. 180 tablet 1   Semaglutide  (RYBELSUS ) 14 MG TABS TAKE 1 TABLET BY MOUTH ONCE DAILY IN THE MORNING 30 MINUTES BEFORE BREAKFAST WITH A SIP OF WATER. 30 tablet 0   sildenafil  (VIAGRA ) 100  MG tablet TAKE 1 TABLET BY MOUTH EVERY DAY AS NEEDED FOR ERECTILE DYSFUNCTION 6 tablet 1   tamsulosin  (FLOMAX ) 0.4 MG CAPS capsule TAKE 1 CAPSULE BY MOUTH ONCE DAILY AFTER SUPPER 90 capsule 0   Tiotropium Bromide Monohydrate  (SPIRIVA  RESPIMAT) 2.5 MCG/ACT AERS Inhale 2 puffs into the lungs daily. 4 g 11   tiZANidine  (ZANAFLEX ) 4 MG tablet Take 1 tablet (4 mg total) by mouth every 6 (six) hours as needed for muscle spasms. 30 tablet 0   Vitamin D, Ergocalciferol, (DRISDOL) 1.25 MG (50000 UNIT) CAPS capsule Take 50,000 Units by mouth once a week.     No current facility-administered medications for this visit.    PHYSICAL EXAM: Vitals:   01/31/24 0908  BP: 120/60  Pulse: 62  Resp: 16  SpO2: 95%  Weight: 226 lb (102.5 kg)  Height: 5' 7 (1.702 m)     Body mass index is 35.4 kg/m.  Wt Readings from Last 3 Encounters:  01/31/24 226 lb (102.5 kg)  01/21/24 223 lb (101.2 kg)  01/02/24 219 lb (99.3 kg)    General: Well developed, well nourished male in no apparent distress.  HEENT: AT/[place], no external lesions.  Eyes: Conjunctiva clear and no icterus. Neck: Neck supple  Lungs: Respirations not labored Neurologic: Alert, oriented, normal speech Extremities / Skin: Dry.  Psychiatric: Does not appear depressed or anxious  Diabetic Foot Exam - Simple   No data filed    LABS Reviewed Lab Results  Component Value Date   HGBA1C 6.0 (  A) 01/31/2024   HGBA1C 5.7 (A) 09/14/2023   HGBA1C 6.1 05/14/2023   Lab Results  Component Value Date   FRUCTOSAMINE 211 05/14/2023   FRUCTOSAMINE 241 02/24/2020   FRUCTOSAMINE 263 05/15/2019   Lab Results  Component Value Date   CHOL 126 08/13/2023   HDL 53.10 08/13/2023   LDLCALC 63 08/13/2023   LDLDIRECT 80.1 04/15/2014   TRIG 51.0 08/13/2023   CHOLHDL 2 08/13/2023   Lab Results  Component Value Date   MICRALBCREAT 1.8 02/25/2010   MICRALBCREAT 3.2 04/06/2009   Lab Results  Component Value Date   CREATININE 1.66 (H) 10/11/2023    Lab Results  Component Value Date   GFR 41.57 (L) 08/13/2023   ASSESSMENT / PLAN  1. Type 2 diabetes mellitus with hyperglycemia, without long-term current use of insulin  (HCC)     Diabetes Mellitus type 2, complicated by CKD. - Diabetic status / severity: Controlled.  Lab Results  Component Value Date   HGBA1C 6.0 (A) 01/31/2024    - Hemoglobin A1c goal : <6.5%  Mostly acceptable blood sugar based on glucometer data.  - Medications: No change.  I) continue Rybelsus  14 mg daily. II) continue Amaryl  1 mg daily.  - Home glucose testing: In the morning fasting and at bedtime. - Discussed/ Gave Hypoglycemia treatment plan.  Discussed about potential hypoglycemic symptoms.  # Consult : not required at this time.   # Annual urine for microalbuminuria/ creatinine ratio, no microalbuminuria currently, he has CKD following with nephrology. Normal urine microalbumin creatinine ratio in 05/2023.  Last  Lab Results  Component Value Date   MICRALBCREAT 1.8 02/25/2010    # Foot check nightly.  # Annual dilated diabetic eye exams. Has appointment today.   - Diet: Make healthy diabetic food choices - Life style / activity / exercise: Discussed.  2. Blood pressure  -  BP Readings from Last 1 Encounters:  01/31/24 120/60    - Control is in target.  - No change in current plans.  3. Lipid status / Hyperlipidemia - Last  Lab Results  Component Value Date   LDLCALC 63 08/13/2023   - Continue atorvastatin  20 mg daily.  Managed by primary care provider.  Diagnoses and all orders for this visit:  Type 2 diabetes mellitus with hyperglycemia, without long-term current use of insulin  (HCC) -     POCT glycosylated hemoglobin (Hb A1C)   DISPOSITION Follow up in clinic in 4 months suggested.   All questions answered and patient verbalized understanding of the plan.  Noah Yeni Jiggetts, MD [facility] Endocrinology [facility] [address] [place], [address] [place], [place] [address] Phone # [phone]  At least part of this note was generated using voice recognition software. Inadvertent word errors may have occurred, which were not recognized during the proofreading process.

## 2024-02-04 ENCOUNTER — Encounter: Payer: Self-pay | Admitting: Physical Therapy

## 2024-02-04 ENCOUNTER — Other Ambulatory Visit: Payer: Self-pay

## 2024-02-04 ENCOUNTER — Ambulatory Visit: Admitting: Physical Therapy

## 2024-02-04 ENCOUNTER — Other Ambulatory Visit: Payer: Self-pay | Admitting: Endocrinology

## 2024-02-04 DIAGNOSIS — E1169 Type 2 diabetes mellitus with other specified complication: Secondary | ICD-10-CM

## 2024-02-04 DIAGNOSIS — M6281 Muscle weakness (generalized): Secondary | ICD-10-CM | POA: Diagnosis not present

## 2024-02-04 DIAGNOSIS — M5459 Other low back pain: Secondary | ICD-10-CM

## 2024-02-04 NOTE — Therapy (Addendum)
 " OUTPATIENT PHYSICAL THERAPY TREATMENT  DISCHARGE   Patient Name: Noah Barnes MRN: [id number] DOB:1945/09/01, 78 y.o., male Today's Date: 02/04/2024   END OF SESSION:  PT End of Session - 02/04/24 0846     Visit Number 8    Number of Visits 15    Date for PT Re-Evaluation 03/24/24    Authorization Type [employer] MCR    Authorization Time Period 02/04/2024 - 05/04/2024    Authorization - Visit Number 1    Authorization - Number of Visits 8    Progress Note Due on Visit 10    PT Start Time 0845    PT Stop Time 0925    PT Time Calculation (min) 40 min    Activity Tolerance Patient tolerated treatment well    Behavior During Therapy [facility] for tasks assessed/performed                 Past Medical History:  Diagnosis Date   CHF (congestive heart failure) (HCC)    Chronic diastolic heart failure (HCC)    Constipation    Diabetes mellitus    Diverticulitis    Diverticulosis    ED (erectile dysfunction)    Gout    Heart murmur    Hyperlipidemia    Hypertension    IBS (irritable bowel syndrome)    Renal calculus    Renal insufficiency    Tubular adenoma of colon    Past Surgical History:  Procedure Laterality Date   COLONOSCOPY  1998   negative; no F/U   EXTRACORPOREAL SHOCK WAVE LITHOTRIPSY Left 03/20/2019   Procedure: EXTRACORPOREAL SHOCK WAVE LITHOTRIPSY (ESWL);  Surgeon: Sherrilee Belvie CROME, MD;  Location: [facility];  Service: Urology;  Laterality: Left;   knee effusion tapped     Patient Active Problem List   Diagnosis Date Noted   Lump of skin 01/21/2024   B12 deficiency 08/12/2023   Acute pain of both shoulders 10/31/2022   Right shoulder pain 10/31/2022   Right-sided chest pain 04/19/2021   Chest congestion 04/15/2021   Acquired trigger finger of right little finger 12/30/2020   Bilateral primary osteoarthritis of knee 12/30/2020   Aortic atherosclerosis (HCC) 12/29/2020   Chronic bronchitis (HCC) 07/28/2020   BPH (benign prostatic hyperplasia)  07/05/2020   Left lower quadrant abdominal pain 11/28/2019   Lung nodule - stable 04/04/2019   History of DVT of lower extremity, b/l and PE 04/03/2019   History of pulmonary embolus (PE) 04/03/2019   Nephrolithiasis 03/06/2019   Right low back pain 07/03/2018   Seasonal allergic rhinitis 05/22/2018   MR (mitral regurgitation) 12/20/2017   CKD (chronic kidney disease) stage 3, GFR 30-59 ml/min (HCC) 08/14/2017   Abdominal hernia without obstruction and without gangrene 01/10/2016   Chronic diastolic heart failure (HCC) 02/25/2015   Right renal mass 02/25/2015   Other constipation 02/22/2015   Chronic venous insufficiency 01/18/2015   Superficial phlebitis 01/18/2015   IBS (irritable bowel syndrome) 11/12/2013   Family history of prostate cancer 12/26/2011   Diabetes mellitus with renal complications (HCC) 12/18/2008   Gout 12/31/2007   HYPERLIPIDEMIA 08/14/2007   ED (erectile dysfunction) 05/09/2007   Essential hypertension 11/28/2006    PCP: Geofm Glade PARAS, MD  REFERRING PROVIDER: Leonce Katz, DO  REFERRING DIAG: Chronic right-sided low back pain without sciatica; Degeneration of intervertebral disc of lumbar region with discogenic back pain  Rationale for Evaluation and Treatment: Rehabilitation  THERAPY DIAG:  Other low back pain  Muscle weakness (generalized)  ONSET DATE: 09/03/2023  SUBJECTIVE:        SUBJECTIVE STATEMENT: Patient reports he is doing well today. [facility]   Eval: Patient reports pain in the lower back that is mainly on the right side. The pain has been going for a few months now without any specific mechanism of injury. It is very stiff in the mornings especially if he wakes up on the right side. As he moves throughout day the pain will improve.  He reports also that arching his back or just moving in certain directions can cause his pain. He denies any numbness/tingling or pain into his  legs.  PERTINENT HISTORY:  See PMH above  PAIN:  Are you having pain? Yes:  NPRS scale: 0/10 currently Pain location: Lower back Pain description: Stiff, sore Aggravating factors: Pain worse in morning when going to straighten up, arching lower back Relieving factors: Rest  PRECAUTIONS: None  PATIENT GOALS: Pain relief   OBJECTIVE:  Note: Objective measures were completed at Evaluation unless otherwise noted. PATIENT SURVEYS:  Modified Oswestry 6/50 (12% disability)   01/28/2024: 3/60 (6%)  MUSCLE LENGTH: Limitations with bilateral hamstring, hip flexor and quad  POSTURE:   Rounded shoulder and forward head posture  Increased lumbar lordosis  PALPATION: Mildly tender to right lumbar paraspinal region  LUMBAR ROM:   AROM eval 01/28/2024  Flexion 50% 50%  Extension 25% 25%  Right lateral flexion 50% 50%  Left lateral flexion 50% 50%  Right rotation 50% 50%  Left rotation 50% 50%   (Blank rows = not tested)  LOWER EXTREMITY ROM:    Hip PROM grossly WFL except slight limitation with hip IR bilaterally   LOWER EXTREMITY MMT:    MMT Right eval Left eval Rt / Lt 01/09/2024 Rt / Lt 01/28/2024  Hip flexion 4- 4-  4- / 4-  Hip extension 3- 3 3 / 3 3 / 3  Hip abduction 3 3+ 3 / 3+ 3 / 3+  Hip adduction      Hip internal rotation      Hip external rotation      Knee flexion 5 5    Knee extension 5 5    Ankle dorsiflexion      Ankle plantarflexion      Ankle inversion      Ankle eversion       (Blank rows = not tested)  FUNCTIONAL TESTS:  Lifting: patient demonstrates increased spinal flexion  02/04/2024: patient demonstrates improved hip hinge technique with good posture  GAIT: Assistive device utilized: None Level of assistance: Complete Independence Comments: Grossly WFL   TREATMENT [facility] Adult PT Treatment:                                                DATE: 02/04/2024 Recumbent bike L4 x 5 min to improve endurance and workload capacity Seated lumbar  flexion stability ball roll out fwd 5 x 5 sec, diagonal 5 x 5 sec each Seated hamstring stretch 3 x 20 sec each Standing calf stretch at counter 3 x 20 sec each Modified thomas stretch with passive knee flexion 3 x 30 sec LTR 10 x 5 sec each Piriformis stretch 3 x 20 sec each Sit to stand holding 15# at chest 2 x 10 Deadlift from 8 box 30# 3 x 10 Pallof press  with L1 powerband 2 x 10 each  PATIENT EDUCATION:  Education details: HEP Person educated: Patient Education method: Explanation, Demonstration, Tactile cues, Verbal cues Education comprehension: verbalized understanding, returned demonstration, verbal cues required, tactile cues required, and needs further education  HOME EXERCISE PROGRAM: Access Code: [email or web address]   ASSESSMENT: CLINICAL IMPRESSION: Patient tolerated therapy well with no adverse effects. Therapy continues to focus on stretching for lumbar and hip mobility and progressing his strengthening exercises to improve his activity tolerance. He was able to progress to using weight with sit-to-stands and increased weight his lifting this visit. He does continue to exhibit gross limitations in his lumbar mobility and flexibility but seems to be progress well with his strengthening and demonstrates much better lifting technique. No changes made to his HEP. Patient would benefit from continued skilled PT to progress mobility and strength in order to reduce pain and maximize functional ability.   Eval: Patient is a 78 y.o. male who was seen today for physical therapy evaluation and treatment for chronic lower back pain. Currently he demonstrates limitations in his lumbar mobility, flexibility deficits, and gross strength impairments of the core and hip musculature. His pain seems to be musculoskeletal in nature without any radicular symptoms this visit.  OBJECTIVE IMPAIRMENTS: decreased activity tolerance, decreased ROM, decreased strength, impaired flexibility, postural dysfunction,  and pain.   ACTIVITY LIMITATIONS: lifting, bending, squatting, sleeping, and locomotion level  PARTICIPATION LIMITATIONS: meal prep, cleaning, community activity, and yard work  PERSONAL FACTORS: Fitness, Past/current experiences, and Time since onset of injury/illness/exacerbation are also affecting patient's functional outcome.    GOALS: Goals reviewed with patient? Yes  SHORT TERM GOALS: Target date: 02/25/2024  Patient will be I with initial HEP in order to progress with therapy. Baseline: HEP provided at eval 01/02/2024: independent with initial HEP Goal status: MET  2.  Patient will report lower back pain </= 2/10 in the mornings to reduce functional limitations Baseline: 3-4/10 pain 01/02/2024: patient reports increased pain this visit. 01/28/2024: continues to report increase pain in the morning Goal status: ONGOING  LONG TERM GOALS: Target date: 03/24/2024  Patient will be I with final HEP to maintain progress from PT. Baseline: HEP provided at eval 01/28/2024: progressing HEP Goal status: ONGOING  2.  Patient will report Modified Oswestry </= 2/50 (4% disability) in order to indicate an improvement in their functional status Baseline: 6/50 (12% disability) 01/28/2024: 3/60 (6%) Goal status: ONGOING  3.  Patient will exhibit lumbar AROM >/= 25% improvement in all planes of movement in order to reduce stiffness and improve mobility with household tasks Baseline: see limitations above 01/28/2024: continues to exhibit mobility deficits of the lumbar spine Goal status: ONGOING  4.  Patient will demonstrate hip strength >/= 4-/5 MMT in order to improve activity tolerance Baseline: see limitations above 01/28/2024: continues to exhibit strength deficits Goal status: ONGOING   PLAN: PT FREQUENCY: 1x/week  PT DURATION: 8 weeks  PLANNED INTERVENTIONS: 97164- PT Re-evaluation, 97110-Therapeutic exercises, 97530- Therapeutic activity, 97112- Neuromuscular re-education, 97535-  Self Care, 02859- Manual therapy, [id number]- Electrical stimulation (unattended), Patient/Family education, Dry Needling, Joint mobilization, Joint manipulation, Spinal manipulation, Spinal mobilization, Cryotherapy, and Moist heat.  PLAN FOR NEXT SESSION: Review HEP and progress PRN, manual/stretching to improve lumbar mobility and address flexibility deficits, progress strengthening for the hips and core, lifting mechanics    Elaine Daring, PT, DPT, LAT, ATC 02/04/24  9:29 AM Phone: [phone] Fax: [phone]   PHYSICAL THERAPY DISCHARGE SUMMARY  Visits from Start of Care: 8  Current functional level related to goals / functional outcomes: See above   Remaining deficits: See above   Education / Equipment: HEP   Patient agrees to discharge. Patient goals were not met. Patient is being discharged due to not returning since the last visit.  Elaine Daring, PT, DPT, LAT, ATC 07/16/2024  10:40 AM Phone: [phone] Fax: [phone]   "

## 2024-02-06 ENCOUNTER — Other Ambulatory Visit: Payer: Self-pay | Admitting: Internal Medicine

## 2024-02-10 ENCOUNTER — Other Ambulatory Visit: Payer: Self-pay | Admitting: Internal Medicine

## 2024-02-13 ENCOUNTER — Encounter: Payer: Self-pay | Admitting: Internal Medicine

## 2024-02-13 NOTE — Progress Notes (Signed)
 Subjective:    Patient ID: Noah Barnes, male    DOB: [birth year], 78 y.o.   MRN: [id number]     HPI Noah Barnes is here for a physical exam and his chronic medical problems.   I saw him recently for soft tissue mass on chest-ultrasound was normal.  Area could correlate with calcified costochondral junction. The area has resolved.    If lays on right side - has hard to standing up straight - has mild pain for a while.  If drives too much - gets stiff.  The pain is manageable.    Medications and allergies reviewed with patient and updated if appropriate.  Current Outpatient Medications on File Prior to Visit  Medication Sig Dispense Refill   Accu-Chek Softclix Lancets lancets CHECK BLOOD SUGAR TWICE A DAY. 100 each 2   albuterol  (PROVENTIL ) (2.5 MG/3ML) 0.083% nebulizer solution Take 3 mLs (2.5 mg total) by nebulization every 6 (six) hours as needed for wheezing or shortness of breath. 75 mL 5   albuterol  (VENTOLIN  HFA) 108 (90 Base) MCG/ACT inhaler Inhale 2 puffs into the lungs every 6 (six) hours as needed for wheezing or shortness of breath. 6.7 g 0   allopurinol  (ZYLOPRIM ) 300 MG tablet Take 1 tablet by mouth once daily 90 tablet 0   atorvastatin  (LIPITOR) 20 MG tablet Take 1 tablet by mouth once daily 90 tablet 0   Blood Glucose Monitoring Suppl (ACCU-CHEK GUIDE ME) w/Device KIT 1 each by Does not apply route in the morning and at bedtime. Use Accu Chek Guide me device to check blood sugar twice daily. 1 kit 0   ELIQUIS  2.5 MG TABS tablet Take 1 tablet by mouth twice daily 180 tablet 0   FERREX 150 150 MG capsule Take 1 capsule by mouth once daily 90 capsule 0   furosemide  (LASIX ) 40 MG tablet Take 40 mg by mouth daily.     glimepiride  (AMARYL ) 1 MG tablet Take 1 tablet (1 mg total) by mouth daily with breakfast. 90 tablet 0   glucose blood (ACCU-CHEK GUIDE) test strip USE AS DIRECTED TO TEST BLOOD SUGAR TWICE DAILY 100 strip 3   hydrALAZINE  (APRESOLINE ) 50 MG tablet TAKE 1  TABLET BY MOUTH TWICE DAILY **ANNUAL  APPOINTMENT  DUE  IN  [place].  MUST  SEE  PROVIDER  FOR  FUTURE  REFILLS** 180 tablet 2   linaclotide  (LINZESS ) 290 MCG CAPS capsule Take 1 capsule (290 mcg total) by mouth daily before breakfast. 90 capsule 3   metoprolol  tartrate (LOPRESSOR ) 100 MG tablet Take 1 tablet by mouth twice daily 180 tablet 0   Semaglutide  (RYBELSUS ) 14 MG TABS TAKE 1 TABLET BY MOUTH ONCE DAILY IN THE MORNING 30 MINUTES BEFORE BREAKFAST WITH A SIP OF WATER. 90 tablet 0   tamsulosin  (FLOMAX ) 0.4 MG CAPS capsule TAKE 1 CAPSULE BY MOUTH ONCE DAILY AFTER SUPPER 90 capsule 0   Tiotropium Bromide Monohydrate  (SPIRIVA  RESPIMAT) 2.5 MCG/ACT AERS Inhale 2 puffs into the lungs daily. 4 g 11   tiZANidine  (ZANAFLEX ) 4 MG tablet Take 1 tablet (4 mg total) by mouth every 6 (six) hours as needed for muscle spasms. 30 tablet 0   Vitamin D, Ergocalciferol, (DRISDOL) 1.25 MG (50000 UNIT) CAPS capsule Take 50,000 Units by mouth once a week.     No current facility-administered medications on file prior to visit.    Review of Systems  Constitutional:  Negative for fever.  Eyes:  Negative for visual disturbance.  Respiratory:  Negative  for cough, shortness of breath and wheezing.   Cardiovascular:  Positive for leg swelling (little at times). Negative for chest pain and palpitations.  Gastrointestinal:  Positive for constipation. Negative for abdominal pain, blood in stool and diarrhea.       No gerd  Genitourinary:  Negative for difficulty urinating, dysuria and hematuria.  Musculoskeletal:  Positive for arthralgias (shoulders, knees) and back pain.  Skin:  Negative for rash.  Neurological:  Positive for headaches (rare). Negative for light-headedness.  Psychiatric/Behavioral:  Negative for dysphoric mood and sleep disturbance. The patient is not nervous/anxious.        Objective:   Vitals:   02/14/24 0935  BP: 118/78  Pulse: 70  Temp: 97.9 F (36.6 C)  SpO2: 97%   Filed Weights    02/14/24 0935  Weight: 221 lb (100.2 kg)   Body mass index is 34.61 kg/m.  BP Readings from Last 3 Encounters:  02/14/24 118/78  01/31/24 120/60  01/21/24 134/84    Wt Readings from Last 3 Encounters:  02/14/24 221 lb (100.2 kg)  01/31/24 226 lb (102.5 kg)  01/21/24 223 lb (101.2 kg)      Physical Exam Constitutional: He appears well-developed and well-nourished. No distress.  HENT:  Head: Normocephalic and atraumatic.  Right Ear: External ear normal.  Left Ear: External ear normal.  Normal ear canals and TM b/l  Mouth/Throat: Oropharynx is clear and moist. Eyes: Conjunctivae and EOM are normal.  Neck: Neck supple. No tracheal deviation present. No thyromegaly present.  No carotid bruit  Cardiovascular: Normal rate, regular rhythm, normal heart sounds and intact distal pulses.   No murmur heard.  No lower extremity edema. Pulmonary/Chest: Effort normal and breath sounds normal. No respiratory distress. He has no wheezes. He has no rales.  Abdominal: Soft. He exhibits no distension. There is no tenderness.  Genitourinary: deferred  Lymphadenopathy:   He has no cervical adenopathy.  Skin: Skin is warm and dry. He is not diaphoretic.  Psychiatric: He has a normal mood and affect. His behavior is normal.    Diabetic Foot Exam - Simple   Simple Foot Form Diabetic Foot exam was performed with the following findings: Yes 02/14/2024 10:15 AM  Visual Inspection No deformities, no ulcerations, no other skin breakdown bilaterally: Yes Sensation Testing Intact to touch and monofilament testing bilaterally: Yes Pulse Check Posterior Tibialis and Dorsalis pulse intact bilaterally: Yes Comments     Reviewed last blood work done with nephrology    Assessment & Plan:   Physical exam: Screening blood work  deferred Exercise   - minimal - > plans on dong more [facility] Weight  obese Substance abuse   none   Reviewed recommended immunizations.  Discussed getting Tdap and shingles  vaccine at the pharmacy   Health Maintenance  Topic Date Due   DTaP/Tdap/Td (1 - Tdap) Never done   Zoster Vaccines- Shingrix (1 of 2) Never done   Diabetic kidney evaluation - Urine ACR  02/26/2011   COVID-19 Vaccine (8 - 2024-25 season) 09/23/2023   INFLUENZA VACCINE  02/01/2024   HEMOGLOBIN A1C  08/02/2024   Diabetic kidney evaluation - eGFR measurement  10/10/2024   Medicare Annual Wellness (AWV)  01/01/2025   OPHTHALMOLOGY EXAM  01/30/2025   FOOT EXAM  02/13/2025   Colonoscopy  02/22/2025   Pneumococcal Vaccine: 50+ Years  Completed   Hepatitis C Screening  Completed   HPV VACCINES  Aged Out   Meningococcal B Vaccine  Aged Out   Pneumococcal  Vaccine  Discontinued     See Problem List for Assessment and Plan of chronic medical problems.

## 2024-02-13 NOTE — Patient Instructions (Addendum)
 Medications changes include :   None     Return in about 6 months (around 08/16/2024) for follow up.   Health Maintenance, Male Adopting a healthy lifestyle and getting preventive care are important in promoting health and wellness. Ask your health care provider about: The right schedule for you to have regular tests and exams. Things you can do on your own to prevent diseases and keep yourself healthy. What should I know about diet, weight, and exercise? Eat a healthy diet  Eat a diet that includes plenty of vegetables, fruits, low-fat dairy products, and lean protein. Do not eat a lot of foods that are high in solid fats, added sugars, or sodium. Maintain a healthy weight Body mass index (BMI) is a measurement that can be used to identify possible weight problems. It estimates body fat based on height and weight. Your health care provider can help determine your BMI and help you achieve or maintain a healthy weight. Get regular exercise Get regular exercise. This is one of the most important things you can do for your health. Most adults should: Exercise for at least 150 minutes each week. The exercise should increase your heart rate and make you sweat (moderate-intensity exercise). Do strengthening exercises at least twice a week. This is in addition to the moderate-intensity exercise. Spend less time sitting. Even light physical activity can be beneficial. Watch cholesterol and blood lipids Have your blood tested for lipids and cholesterol at 77 years of age, then have this test every 5 years. You may need to have your cholesterol levels checked more often if: Your lipid or cholesterol levels are high. You are older than 77 years of age. You are at high risk for heart disease. What should I know about cancer screening? Many types of cancers can be detected early and may often be prevented. Depending on your health history and family history, you may need to have cancer  screening at various ages. This may include screening for: Colorectal cancer. Prostate cancer. Skin cancer. Lung cancer. What should I know about heart disease, diabetes, and high blood pressure? Blood pressure and heart disease High blood pressure causes heart disease and increases the risk of stroke. This is more likely to develop in people who have high blood pressure readings or are overweight. Talk with your health care provider about your target blood pressure readings. Have your blood pressure checked: Every 3-5 years if you are 96-3 years of age. Every year if you are 53 years old or older. If you are between the ages of 32 and 13 and are a current or former smoker, ask your health care provider if you should have a one-time screening for abdominal aortic aneurysm (AAA). Diabetes Have regular diabetes screenings. This checks your fasting blood sugar level. Have the screening done: Once every three years after age 27 if you are at a normal weight and have a low risk for diabetes. More often and at a younger age if you are overweight or have a high risk for diabetes. What should I know about preventing infection? Hepatitis B If you have a higher risk for hepatitis B, you should be screened for this virus. Talk with your health care provider to find out if you are at risk for hepatitis B infection. Hepatitis C Blood testing is recommended for: Everyone born from [birth year] through 1964. Anyone with known risk factors for hepatitis C. Sexually transmitted infections (STIs) You should be screened each year for STIs, including  gonorrhea and chlamydia, if: You are sexually active and are younger than 77 years of age. You are older than 77 years of age and your health care provider tells you that you are at risk for this type of infection. Your sexual activity has changed since you were last screened, and you are at increased risk for chlamydia or gonorrhea. Ask your health care provider if  you are at risk. Ask your health care provider about whether you are at high risk for HIV. Your health care provider may recommend a prescription medicine to help prevent HIV infection. If you choose to take medicine to prevent HIV, you should first get tested for HIV. You should then be tested every 3 months for as long as you are taking the medicine. Follow these instructions at home: Alcohol use Do not drink alcohol if your health care provider tells you not to drink. If you drink alcohol: Limit how much you have to 0-2 drinks a day. Know how much alcohol is in your drink. In the [place] one drink equals one 12 oz bottle of beer (355 mL), one 5 oz glass of wine (148 mL), or one 1 oz glass of hard liquor (44 mL). Lifestyle Do not use any products that contain nicotine or tobacco. These products include cigarettes, chewing tobacco, and vaping devices, such as e-cigarettes. If you need help quitting, ask your health care provider. Do not use street drugs. Do not share needles. Ask your health care provider for help if you need support or information about quitting drugs. General instructions Schedule regular health, dental, and eye exams. Stay current with your vaccines. Tell your health care provider if: You often feel depressed. You have ever been abused or do not feel safe at home. Summary Adopting a healthy lifestyle and getting preventive care are important in promoting health and wellness. Follow your health care provider's instructions about healthy diet, exercising, and getting tested or screened for diseases. Follow your health care provider's instructions on monitoring your cholesterol and blood pressure. This information is not intended to replace advice given to you by your health care provider. Make sure you discuss any questions you have with your health care provider. Document Revised: 11/08/2020 Document Reviewed: 11/08/2020 Elsevier Patient Education  2024 [employer].

## 2024-02-14 ENCOUNTER — Ambulatory Visit (INDEPENDENT_AMBULATORY_CARE_PROVIDER_SITE_OTHER): Payer: Medicare PPO | Admitting: Internal Medicine

## 2024-02-14 ENCOUNTER — Ambulatory Visit: Payer: Self-pay | Admitting: Internal Medicine

## 2024-02-14 VITALS — BP 118/78 | HR 70 | Temp 97.9°F | Ht 67.0 in | Wt 221.0 lb

## 2024-02-14 DIAGNOSIS — I5032 Chronic diastolic (congestive) heart failure: Secondary | ICD-10-CM | POA: Diagnosis not present

## 2024-02-14 DIAGNOSIS — Z7984 Long term (current) use of oral hypoglycemic drugs: Secondary | ICD-10-CM

## 2024-02-14 DIAGNOSIS — I10 Essential (primary) hypertension: Secondary | ICD-10-CM | POA: Diagnosis not present

## 2024-02-14 DIAGNOSIS — N401 Enlarged prostate with lower urinary tract symptoms: Secondary | ICD-10-CM

## 2024-02-14 DIAGNOSIS — M109 Gout, unspecified: Secondary | ICD-10-CM

## 2024-02-14 DIAGNOSIS — E1129 Type 2 diabetes mellitus with other diabetic kidney complication: Secondary | ICD-10-CM | POA: Diagnosis not present

## 2024-02-14 DIAGNOSIS — Z0000 Encounter for general adult medical examination without abnormal findings: Secondary | ICD-10-CM | POA: Diagnosis not present

## 2024-02-14 DIAGNOSIS — E782 Mixed hyperlipidemia: Secondary | ICD-10-CM

## 2024-02-14 DIAGNOSIS — N529 Male erectile dysfunction, unspecified: Secondary | ICD-10-CM

## 2024-02-14 DIAGNOSIS — J4530 Mild persistent asthma, uncomplicated: Secondary | ICD-10-CM | POA: Insufficient documentation

## 2024-02-14 DIAGNOSIS — N1831 Chronic kidney disease, stage 3a: Secondary | ICD-10-CM | POA: Diagnosis not present

## 2024-02-14 DIAGNOSIS — E119 Type 2 diabetes mellitus without complications: Secondary | ICD-10-CM | POA: Insufficient documentation

## 2024-02-14 DIAGNOSIS — E538 Deficiency of other specified B group vitamins: Secondary | ICD-10-CM | POA: Diagnosis not present

## 2024-02-14 LAB — MICROALBUMIN / CREATININE URINE RATIO
Creatinine,U: 151.3 mg/dL
Microalb Creat Ratio: 19.7 mg/g (ref 0.0–30.0)
Microalb, Ur: 3 mg/dL — ABNORMAL HIGH (ref 0.0–1.9)

## 2024-02-14 MED ORDER — SILDENAFIL CITRATE 100 MG PO TABS: ORAL_TABLET | ORAL | 5 refills | Status: DC

## 2024-02-14 NOTE — Assessment & Plan Note (Signed)
 Chronic Following with pulmonary Stable

## 2024-02-14 NOTE — Assessment & Plan Note (Addendum)
 Chronic With CKD  Lab Results  Component Value Date   HGBA1C 6.0 (A) 01/31/2024   Sugars well controlled Management per endocrine-on glimepiride  1 mg daily, Rybelsus  14 mg daily Stressed regular exercise - will work on increasing exercise, diabetic diet

## 2024-02-14 NOTE — Assessment & Plan Note (Signed)
Chronic Controlled Continue Flomax 0.4 mg daily

## 2024-02-14 NOTE — Assessment & Plan Note (Addendum)
 Chronic Controlled- denies gout flares Continue allopurinol  300 mg daily

## 2024-02-14 NOTE — Assessment & Plan Note (Addendum)
 Chronic ?Check B12 level ?

## 2024-02-14 NOTE — Assessment & Plan Note (Addendum)
Chronic Regular exercise and healthy diet encouraged Continue atorvastatin 20 mg daily 

## 2024-02-14 NOTE — Assessment & Plan Note (Signed)
 Chronic Sildenafil  prn

## 2024-02-14 NOTE — Assessment & Plan Note (Addendum)
 Chronic Following with nephrology Stable

## 2024-02-14 NOTE — Assessment & Plan Note (Signed)
Chronic Euvolemic Continue furosemide 40 mg a few times a week, metoprolol 100 mg twice daily

## 2024-02-14 NOTE — Assessment & Plan Note (Addendum)
Chronic Blood pressure well controlled GFR stable Continue hydralazine 50 mg twice daily, metoprolol 100 mg twice daily

## 2024-02-19 ENCOUNTER — Other Ambulatory Visit: Payer: Self-pay | Admitting: Internal Medicine

## 2024-02-26 ENCOUNTER — Other Ambulatory Visit (HOSPITAL_BASED_OUTPATIENT_CLINIC_OR_DEPARTMENT_OTHER): Payer: Self-pay | Admitting: Pulmonary Disease

## 2024-02-26 DIAGNOSIS — R0789 Other chest pain: Secondary | ICD-10-CM

## 2024-02-27 ENCOUNTER — Other Ambulatory Visit: Payer: Self-pay | Admitting: Internal Medicine

## 2024-03-04 ENCOUNTER — Other Ambulatory Visit: Payer: Self-pay | Admitting: Endocrinology

## 2024-03-19 ENCOUNTER — Other Ambulatory Visit (HOSPITAL_BASED_OUTPATIENT_CLINIC_OR_DEPARTMENT_OTHER): Payer: Self-pay

## 2024-03-19 MED ORDER — COMIRNATY 30 MCG/0.3ML IM SUSY
0.3000 mL | PREFILLED_SYRINGE | Freq: Once | INTRAMUSCULAR | 0 refills | Status: AC
  Filled 2024-03-19: qty 0.3, 1d supply, fill #0

## 2024-03-20 ENCOUNTER — Ambulatory Visit: Admitting: Sports Medicine

## 2024-03-20 VITALS — HR 71 | Ht 67.0 in | Wt 228.0 lb

## 2024-03-20 DIAGNOSIS — M5136 Other intervertebral disc degeneration, lumbar region with discogenic back pain only: Secondary | ICD-10-CM | POA: Diagnosis not present

## 2024-03-20 DIAGNOSIS — G8929 Other chronic pain: Secondary | ICD-10-CM | POA: Diagnosis not present

## 2024-03-20 MED ORDER — LIDOCAINE 5 % EX PTCH: 1.0000 | MEDICATED_PATCH | CUTANEOUS | 1 refills | Status: AC

## 2024-03-20 NOTE — Progress Notes (Signed)
 Ben Resha Filippone D.[facility] [address] [place] [address] Phone: [phone]   Assessment and Plan:     1. Chronic right-sided low back pain without sciatica (Primary) 2. Degeneration of intervertebral disc of lumbar region with discogenic back pain -Chronic with exacerbation, subsequent visit - Still most consistent with low back pain caused by lumbar DDD without specific MOI or radicular symptoms.  Patient does have a 5 mm right renal calculus, though symptoms are still more consistent with lumbar origin - Do not recommend p.o. NSAIDs or prednisone  with past medical history of DM type II and chronic anticoagulation on Eliquis  - Use Tylenol  500 to 1000 mg tablets 2-3 times a day for day-to-day pain relief - Continue HEP.  Patient completed physical therapy with mild improvement in symptoms - May trial topical Voltaren gel, lidocaine  5% patches topically over areas of pain -Recommend further evaluation with lumbar MRI patient on failure to improve despite >6 weeks of conservative therapy, pain with day-to-day activities, pain >6/10    Pertinent previous records reviewed include none   Follow Up: 5 days after MRI to review results and discuss treatment plan.  Could consider epidural CSI if appropriate.  Could consider prednisone  pack, though would recommend limiting its long-term use   Subjective:   I, [name], am serving as a Neurosurgeon for Doctor Morene Mace   Chief Complaint: low back pain    HPI:    11/14/2023 Patient is a 78 year old male with low back pain. Patient states pain started a while ago. Xrays showed a small kidney stone. No MOI. Pain when laying on his right side. Pain radiates up the back. No meds for the pain. He does not feel it until he lays on his right side. He is stiff when he wakes up   03/20/2024 Patient states pain is now moving all the way around, even to his stomach. Pain is intermittent. 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   Current Outpatient Medications:    lidocaine  (LIDODERM ) 5 %, Place 1 patch onto the skin daily. Remove & Discard patch within 12 hours or as directed by MD, Disp: 30 patch, Rfl: 1   ACCU-CHEK GUIDE TEST test strip, USE AS DIRECTED TO TEST BLOOD GLUCOSE TWICE DAILY, Disp: 100 strip, Rfl: 3   Accu-Chek Softclix Lancets lancets, CHECK BLOOD SUGAR TWICE A DAY., Disp: 100 each, Rfl: 2   albuterol  (PROVENTIL ) (2.5 MG/3ML) 0.083% nebulizer solution, Take 3 mLs (2.5 mg total) by nebulization every 6 (six) hours as needed for wheezing or shortness of breath., Disp: 75 mL, Rfl: 5   albuterol  (VENTOLIN  HFA) 108 (90 Base) MCG/ACT inhaler, INHALE 2 PUFFS BY MOUTH EVERY 6 HOURS AS NEEDED FOR WHEEZING FOR SHORTNESS OF BREATH, Disp: 9 g, Rfl: 7   allopurinol  (ZYLOPRIM ) 300 MG tablet, Take 1 tablet by mouth once daily, Disp: 90 tablet, Rfl: 0   atorvastatin  (LIPITOR) 20 MG tablet, Take 1 tablet by mouth once daily, Disp: 90 tablet, Rfl: 0   Blood Glucose Monitoring Suppl (ACCU-CHEK GUIDE ME) w/Device KIT, 1 each by Does not apply route in the morning and at bedtime. Use Accu Chek Guide me device to check blood sugar twice daily., Disp: 1 kit, Rfl: 0   COVID-19 mRNA vaccine, Pfizer, (COMIRNATY ) syringe, Inject 0.3 mLs  into the muscle once for 1 dose., Disp: 0.3 mL, Rfl: 0   ELIQUIS  2.5 MG TABS tablet, Take 1 tablet by mouth twice daily, Disp: 180 tablet, Rfl: 0   FERREX 150 150 MG capsule, Take 1 capsule by mouth once daily, Disp: 90 capsule, Rfl: 0   furosemide  (LASIX ) 40 MG tablet, Take 40 mg by mouth daily., Disp: , Rfl:    glimepiride  (AMARYL ) 1 MG tablet, Take 1 tablet (1 mg total) by mouth daily with breakfast., Disp: 90 tablet, Rfl: 0   hydrALAZINE  (APRESOLINE ) 50 MG tablet, TAKE 1 TABLET BY MOUTH TWICE  DAILY . APPOINTMENT REQUIRED FOR FUTURE REFILLS, Disp: 180 tablet, Rfl: 0   linaclotide  (LINZESS ) 290 MCG CAPS capsule, Take 1 capsule (290 mcg total) by mouth daily before breakfast., Disp: 90 capsule, Rfl: 3   metoprolol  tartrate (LOPRESSOR ) 100 MG tablet, Take 1 tablet by mouth twice daily, Disp: 180 tablet, Rfl: 0   Semaglutide  (RYBELSUS ) 14 MG TABS, TAKE 1 TABLET BY MOUTH ONCE DAILY IN THE MORNING 30 MINUTES BEFORE BREAKFAST WITH A SIP OF WATER., Disp: 90 tablet, Rfl: 0   sildenafil  (VIAGRA ) 100 MG tablet, TAKE 1 TABLET BY MOUTH EVERY DAY AS NEEDED FOR ERECTILE DYSFUNCTION, Disp: 6 tablet, Rfl: 5   tamsulosin  (FLOMAX ) 0.4 MG CAPS capsule, TAKE 1 CAPSULE BY MOUTH ONCE DAILY AFTER SUPPER, Disp: 90 capsule, Rfl: 0   Tiotropium Bromide Monohydrate  (SPIRIVA  RESPIMAT) 2.5 MCG/ACT AERS, Inhale 2 puffs into the lungs daily., Disp: 4 g, Rfl: 11   tiZANidine  (ZANAFLEX ) 4 MG tablet, Take 1 tablet (4 mg total) by mouth every 6 (six) hours as needed for muscle spasms., Disp: 30 tablet, Rfl: 0   Vitamin D, Ergocalciferol, (DRISDOL) 1.25 MG (50000 UNIT) CAPS capsule, Take 50,000 Units by mouth once a week., Disp: , Rfl:    Objective:     Vitals:   03/20/24 0839  Pulse: 71  SpO2: 94%  Weight: 228 lb (103.4 kg)  Height: 5' 7 (1.702 m)      Body mass index is 35.71 kg/m.    Physical Exam:    Gen: Appears well, nad, nontoxic and pleasant Psych: Alert and oriented, appropriate mood and affect Neuro: sensation intact, strength is 5/5 in upper and lower extremities, muscle tone wnl Skin: no susupicious lesions or rashes   Back - Normal skin, Spine with normal alignment and no deformity.   No tenderness to vertebral process palpation.   Right lumbar paraspinous muscles are   tender and without spasm NTTP gluteal musculature Straight leg raise negative Trendelenberg negative Piriformis Test negative Gait normal  Right-sided low back pain with lumbar flexion and extension    Electronically  signed by:  Odis Mace [facility] 8:49 AM 03/20/24

## 2024-03-20 NOTE — Patient Instructions (Signed)
 MRI lumbar referral  Tylenol  [phone] mg 2-3 times a day for pain relief   Lidocaine  patches   Follow up 5 days after to discuss results

## 2024-03-21 ENCOUNTER — Other Ambulatory Visit: Payer: Self-pay | Admitting: Internal Medicine

## 2024-04-01 ENCOUNTER — Other Ambulatory Visit: Payer: Self-pay | Admitting: Hematology & Oncology

## 2024-04-01 DIAGNOSIS — Z86711 Personal history of pulmonary embolism: Secondary | ICD-10-CM

## 2024-04-01 DIAGNOSIS — Z86718 Personal history of other venous thrombosis and embolism: Secondary | ICD-10-CM

## 2024-04-02 ENCOUNTER — Ambulatory Visit
Admission: RE | Admit: 2024-04-02 | Discharge: 2024-04-02 | Disposition: A | Discharge status: Home / Self Care | Source: Ambulatory Visit | Attending: Sports Medicine | Admitting: Sports Medicine

## 2024-04-02 DIAGNOSIS — M545 Low back pain, unspecified: Secondary | ICD-10-CM

## 2024-04-02 DIAGNOSIS — M5136 Other intervertebral disc degeneration, lumbar region with discogenic back pain only: Secondary | ICD-10-CM

## 2024-04-02 DIAGNOSIS — M47816 Spondylosis without myelopathy or radiculopathy, lumbar region: Secondary | ICD-10-CM | POA: Diagnosis not present

## 2024-04-02 DIAGNOSIS — M5126 Other intervertebral disc displacement, lumbar region: Secondary | ICD-10-CM | POA: Diagnosis not present

## 2024-04-04 ENCOUNTER — Other Ambulatory Visit: Payer: Self-pay | Admitting: Endocrinology

## 2024-04-04 DIAGNOSIS — E1165 Type 2 diabetes mellitus with hyperglycemia: Secondary | ICD-10-CM

## 2024-04-08 ENCOUNTER — Encounter: Payer: Self-pay | Admitting: Cardiovascular Disease

## 2024-04-08 ENCOUNTER — Ambulatory Visit: Attending: Cardiovascular Disease | Admitting: Cardiovascular Disease

## 2024-04-08 VITALS — BP 122/70 | HR 64 | Ht 67.5 in | Wt 222.4 lb

## 2024-04-08 DIAGNOSIS — I10 Essential (primary) hypertension: Secondary | ICD-10-CM | POA: Diagnosis not present

## 2024-04-08 DIAGNOSIS — I272 Pulmonary hypertension, unspecified: Secondary | ICD-10-CM | POA: Diagnosis not present

## 2024-04-08 DIAGNOSIS — I700 Atherosclerosis of aorta: Secondary | ICD-10-CM | POA: Diagnosis not present

## 2024-04-08 DIAGNOSIS — I5032 Chronic diastolic (congestive) heart failure: Secondary | ICD-10-CM

## 2024-04-08 DIAGNOSIS — E782 Mixed hyperlipidemia: Secondary | ICD-10-CM | POA: Diagnosis not present

## 2024-04-08 NOTE — Assessment & Plan Note (Signed)
Blood pressure is well-controlled on metoprolol. 

## 2024-04-08 NOTE — Patient Instructions (Signed)
 Medication Instructions:  Your physician recommends that you continue on your current medications as directed. Please refer to the Current Medication list given to you today.  *If you need a refill on your cardiac medications before your next appointment, please call your pharmacy*  Lab Work: NONE  If you have labs (blood work) drawn today and your tests are completely normal, you will receive your results only by: MyChart Message (if you have MyChart) OR A paper copy in the mail If you have any lab test that is abnormal or we need to change your treatment, we will call you to review the results.  Testing/Procedures: Your physician has requested that you have an echocardiogram. Echocardiography is a painless test that uses sound waves to create images of your heart. It provides your doctor with information about the size and shape of your heart and how well your heart's chambers and valves are working. This procedure takes approximately one hour. There are no restrictions for this procedure. Please do NOT wear cologne, perfume, aftershave, or lotions (deodorant is allowed). Please arrive 15 minutes prior to your appointment time.  Please note: We ask at that you not bring children with you during ultrasound (echo/ vascular) testing. Due to room size and safety concerns, children are not allowed in the ultrasound rooms during exams. Our front office staff cannot provide observation of children in our lobby area while testing is being conducted. An adult accompanying a patient to their appointment will only be allowed in the ultrasound room at the discretion of the ultrasound technician under special circumstances. We apologize for any inconvenience.   Follow-Up: At [facility], you and your health needs are our priority.  As part of our continuing mission to provide you with exceptional heart care, our providers are all part of one team.  This team includes your primary Cardiologist  (physician) and Advanced Practice Providers or APPs (Physician Assistants and Nurse Practitioners) who all work together to provide you with the care you need, when you need it.  Your next appointment:   1 year(s)  Provider:   Ozell Fell, MD

## 2024-04-08 NOTE — Progress Notes (Signed)
 Cardiology Office Note:    Date:  04/08/2024   ID:  Noah Barnes, DOB 03/03/1946, MRN [id number]  PCP:  Geofm Glade PARAS, MD   [facility] Providers Cardiologist:  Ozell Fell, MD     Referring MD: Geofm Glade PARAS, MD   Chief Complaint  Patient presents with   Follow-up    HFpEF    History of Present Illness:    Noah Barnes is a 78 y.o. male with a hx of chronic HFpEF, returning for follow-up evaluation. 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 He has never had symptoms of angina.   The patient is here alone today. He follows with Dr Tobie for his CKD. He is having problems with low back pain and just had an MRI last week, with clinical follow-up scheduled at [facility] this week.  The patient has no cardiac complaints today.  He specifically denies chest pain, chest pressure, leg swelling, or shortness of breath.  He has no heart palpitations.  He has ability to exercise has been limited by his back problems.  The patient reported that he played basketball into his late 6s at the [employer].  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  His son is Dr. Buddle who is a Energy manager.   Current Medications: Current Meds  Medication Sig   ACCU-CHEK GUIDE TEST test strip USE AS DIRECTED TO TEST BLOOD GLUCOSE TWICE DAILY   Accu-Chek Softclix Lancets lancets CHECK BLOOD SUGAR TWICE A DAY.   albuterol  (PROVENTIL ) (2.5 MG/3ML) 0.083% nebulizer solution Take 3 mLs (2.5 mg total) by nebulization every 6 (six) hours as needed for wheezing or shortness of breath.   albuterol  (VENTOLIN  HFA) 108 (90 Base) MCG/ACT inhaler INHALE 2 PUFFS BY MOUTH EVERY 6 HOURS AS NEEDED FOR WHEEZING FOR SHORTNESS OF BREATH   allopurinol   (ZYLOPRIM ) 300 MG tablet Take 1 tablet by mouth once daily   atorvastatin  (LIPITOR) 20 MG tablet Take 1 tablet by mouth once daily   Blood Glucose Monitoring Suppl (ACCU-CHEK GUIDE ME) w/Device KIT 1 each by Does not apply route in the morning and at bedtime. Use Accu Chek Guide me device to check blood sugar twice daily.   ELIQUIS  2.5 MG TABS tablet Take 1 tablet by mouth twice daily   FERREX 150 150 MG capsule Take 1 capsule by mouth once daily   furosemide  (LASIX ) 40 MG tablet Take 40 mg by mouth daily.   glimepiride  (AMARYL ) 1 MG tablet Take 1 tablet by mouth once daily with breakfast   hydrALAZINE  (APRESOLINE ) 50 MG tablet TAKE 1 TABLET BY MOUTH TWICE DAILY . APPOINTMENT REQUIRED FOR FUTURE REFILLS   lidocaine  (LIDODERM ) 5 % Place 1 patch onto the skin daily. Remove & Discard patch within 12 hours or as directed by MD   linaclotide  (LINZESS ) 290 MCG CAPS capsule Take 1 capsule (290 mcg total) by mouth daily before breakfast.   metoprolol  tartrate (LOPRESSOR ) 100 MG tablet Take 1 tablet by mouth twice daily   Semaglutide  (RYBELSUS ) 14 MG TABS TAKE 1 TABLET BY MOUTH ONCE DAILY IN THE MORNING 30 MINUTES BEFORE BREAKFAST WITH A SIP OF WATER.   sildenafil  (VIAGRA ) 100 MG tablet TAKE 1 TABLET BY MOUTH EVERY DAY AS NEEDED FOR ERECTILE DYSFUNCTION  tamsulosin  (FLOMAX ) 0.4 MG CAPS capsule TAKE 1 CAPSULE BY MOUTH ONCE DAILY AFTER SUPPER   Tiotropium Bromide Monohydrate  (SPIRIVA  RESPIMAT) 2.5 MCG/ACT AERS Inhale 2 puffs into the lungs daily.   tiZANidine  (ZANAFLEX ) 4 MG tablet Take 1 tablet (4 mg total) by mouth every 6 (six) hours as needed for muscle spasms.   Vitamin D, Ergocalciferol, (DRISDOL) 1.25 MG (50000 UNIT) CAPS capsule Take 50,000 Units by mouth once a week.     Allergies:   Ace inhibitors and Angiotensin receptor blockers   ROS:   Please see the history of present illness.    All other systems reviewed and are negative.  EKGs/Labs/Other Studies Reviewed:    The following studies  were reviewed today: Cardiac Studies & Procedures   ______________________________________________________________________________________________   STRESS TESTS  MYOCARDIAL PERFUSION IMAGING 11/04/2015  Interpretation Summary  The left ventricular ejection fraction is mildly decreased (45-54%).  Nuclear stress EF: 46%.  There was no ST segment deviation noted during stress.  Defect 1: There is a medium defect of moderate severity present in the basal inferior, basal inferolateral, mid inferior, apical inferior and apex location.  This is an intermediate risk study.   ECHOCARDIOGRAM  ECHOCARDIOGRAM COMPLETE 03/26/2019  Narrative ECHOCARDIOGRAM REPORT    Patient Name:   Noah Barnes Date of Exam: 03/26/2019 Medical Rec #:  [id number]       Height:       68.0 in Accession #:    [id number]      Weight:       217.0 lb Date of Birth:  1946/06/06        BSA:          2.12 m Patient Age:    73 years        BP:           151/88 mmHg Patient Gender: M               HR:           82 bpm. Exam Location:  Inpatient  Procedure: 2D Echo, Cardiac Doppler and Color Doppler  Indications:    Dyspnea  History:        Patient has prior history of Echocardiogram examinations, most recent 03/04/2015. CHF Signs/Symptoms:Shortness of Breath Risk Factors:Hypertension, Diabetes and Dyslipidemia. S/p Lithotripsy, Pneumonia.  Sonographer:    Lyle Marc Referring Phys: [id number] ALEJANDRO LATIF [facility]  IMPRESSIONS   1. Left ventricular ejection fraction, by visual estimation, is 60 to 65%. The left ventricle has normal function. Normal left ventricular size. There is mildly increased left ventricular hypertrophy. 2. Left ventricular diastolic Doppler parameters are consistent with impaired relaxation pattern of LV diastolic filling. 3. Global right ventricle has normal systolic function.The right ventricular size is normal. No increase in right ventricular wall thickness. 4. Left atrial size  was normal. 5. Right atrial size was normal. 6. The mitral valve is normal in structure. No evidence of mitral valve regurgitation. No evidence of mitral stenosis. 7. The tricuspid valve is normal in structure. Tricuspid valve regurgitation is mild. 8. The aortic valve is tricuspid Aortic valve regurgitation is trivial by color flow Doppler. Mild aortic valve sclerosis without stenosis. 9. The pulmonic valve was normal in structure. Pulmonic valve regurgitation is trivial by color flow Doppler. 10. Severely elevated pulmonary artery systolic pressure. 11. The inferior vena cava is normal in size with greater than 50% respiratory variability, suggesting right atrial pressure of 3 mmHg.  FINDINGS Left Ventricle: Left ventricular ejection fraction, by  visual estimation, is 60 to 65%. The left ventricle has normal function. There is mildly increased left ventricular hypertrophy. Normal left ventricular size. Spectral Doppler shows Left ventricular diastolic Doppler parameters are consistent with impaired relaxation pattern of LV diastolic filling.  Right Ventricle: The right ventricular size is normal. No increase in right ventricular wall thickness. Global RV systolic function is has normal systolic function. The tricuspid regurgitant velocity is 4.46 m/s, and with an assumed right atrial pressure of 10 mmHg, the estimated right ventricular systolic pressure is severely elevated at 89.6 mmHg.  Left Atrium: Left atrial size was normal in size.  Right Atrium: Right atrial size was normal in size  Pericardium: There is no evidence of pericardial effusion.  Mitral Valve: The mitral valve is normal in structure. No evidence of mitral valve stenosis by observation. No evidence of mitral valve regurgitation.  Tricuspid Valve: The tricuspid valve is normal in structure. Tricuspid valve regurgitation is mild by color flow Doppler.  Aortic Valve: The aortic valve is tricuspid. Aortic valve regurgitation  is trivial by color flow Doppler. Mild aortic valve sclerosis is present, with no evidence of aortic valve stenosis.  Pulmonic Valve: The pulmonic valve was normal in structure. Pulmonic valve regurgitation is trivial by color flow Doppler.  Aorta: The aortic root, ascending aorta and aortic arch are all structurally normal, with no evidence of dilitation or obstruction.  Venous: The inferior vena cava is normal in size with greater than 50% respiratory variability, suggesting right atrial pressure of 3 mmHg.  IAS/Shunts: No atrial level shunt detected by color flow Doppler. No ventricular septal defect is seen or detected. There is no evidence of an atrial septal defect.    LEFT VENTRICLE PLAX 2D LVIDd:         4.30 cm  Diastology LVIDs:         2.90 cm  LV e' lateral:   0.12 cm/s LV PW:         1.30 cm  LV E/e' lateral: 7.0 LV IVS:        1.30 cm  LV e' medial:    0.08 cm/s LVOT diam:     2.00 cm  LV E/e' medial:  11.1 LV SV:         51 ml LV SV Index:   23.05 LVOT Area:     3.14 cm   RIGHT VENTRICLE RV Basal diam:  2.60 cm RV S prime:     12.80 cm/s TAPSE (M-mode): 2.5 cm  LEFT ATRIUM             Index LA diam:        3.50 cm 1.65 cm/m LA Vol (A2C):   53.7 ml 25.38 ml/m LA Vol (A4C):   46.1 ml 21.79 ml/m LA Biplane Vol: 51.8 ml 24.48 ml/m AORTIC VALVE LVOT Vmax:   99.90 cm/s LVOT Vmean:  71.200 cm/s LVOT VTI:    0.227 m  AORTA Ao Root diam: 3.30 cm  MITRAL VALVE                         TRICUSPID VALVE MV Area (PHT): 3.85 cm              TR Peak grad:   79.6 mmHg MV PHT:        57.13 msec            TR Vmax:        451.00 cm/s MV Decel Time: 197  msec MV E velocity: 0.84 cm/s   103 cm/s  SHUNTS MV A velocity: 109.00 cm/s 70.3 cm/s Systemic VTI:  0.23 m MV E/A ratio:  0.01        1.5       Systemic Diam: 2.00 cm   Oneil Parchment MD Electronically signed by Oneil Parchment MD Signature Date/Time: 03/26/2019/3:03:29 PM    Final           ______________________________________________________________________________________________      EKG:   EKG Interpretation Date/Time:  Tuesday April 08 2024 14:26:43 EDT Ventricular Rate:  64 PR Interval:  200 QRS Duration:  82 QT Interval:  374 QTC Calculation: 385 R Axis:   -21  Text Interpretation: Normal sinus rhythm with sinus arrhythmia Minimal voltage criteria for LVH, may be normal variant ( R in aVL ) Nonspecific ST and T wave abnormality When compared with ECG of 26-Feb-2023 08:52, No significant change was found Confirmed by Wonda Sharper [id number]) on 04/08/2024 2:36:03 PM    Recent Labs: 10/11/2023: ALT 38; BUN 31; Creatinine 1.66; Hemoglobin 13.6; Platelets 185; Potassium 4.4; Sodium 139  Recent Lipid Panel    Component Value Date/Time   CHOL 126 08/13/2023 0907   TRIG 51.0 08/13/2023 0907   HDL 53.10 08/13/2023 0907   CHOLHDL 2 08/13/2023 0907   VLDL 10.2 08/13/2023 0907   LDLCALC 63 08/13/2023 0907   LDLDIRECT 80.1 04/15/2014 0953     Risk Assessment/Calculations:                Physical Exam:    VS:  BP 122/70   Pulse 64   Ht 5' 7.5 (1.715 m)   Wt 222 lb 6.4 oz (100.9 kg)   SpO2 97%   BMI 34.32 kg/m     Wt Readings from Last 3 Encounters:  04/08/24 222 lb 6.4 oz (100.9 kg)  03/20/24 228 lb (103.4 kg)  02/14/24 221 lb (100.2 kg)     GEN:  Well nourished, well developed in no acute distress HEENT: Normal NECK: No JVD; No carotid bruits LYMPHATICS: No lymphadenopathy CARDIAC: RRR, no murmurs, rubs, gallops RESPIRATORY:  Clear to auscultation without rales, wheezing or rhonchi  ABDOMEN: Soft, non-tender, non-distended MUSCULOSKELETAL:  No edema; No deformity  SKIN: Warm and dry NEUROLOGIC:  Alert and oriented x 3 PSYCHIATRIC:  Normal affect   Assessment & Plan Mixed hyperlipidemia Treated with atorvastatin , last LDL cholesterol is 63. Aortic atherosclerosis Treated with atorvastatin .  Anticoagulated with low-dose  apixaban . Essential hypertension Blood pressure is well-controlled on metoprolol  Chronic heart failure with preserved ejection fraction (HFpEF) (HCC) Clinically stable, asymptomatic.  Continue furosemide  40 mg daily. Pulmonary hypertension, unspecified (HCC) I went back and looked over his most recent studies.  In 2020 he had an echocardiogram demonstrating severe pulmonary hypertension without evidence of right heart failure.  The patient does not appear to have any clinical symptoms at this time.  However, with that finding, a surveillance echocardiogram is indicated and this will be ordered.      Medication Adjustments/Labs and Tests Ordered: Current medicines are reviewed at length with the patient today.  Concerns regarding medicines are outlined above.  Orders Placed This Encounter  Procedures   EKG 12-Lead   ECHOCARDIOGRAM COMPLETE   No orders of the defined types were placed in this encounter.   Patient Instructions  Medication Instructions:  Your physician recommends that you continue on your current medications as directed. Please refer to the Current Medication list given to you today.  *If you need  a refill on your cardiac medications before your next appointment, please call your pharmacy*  Lab Work: NONE  If you have labs (blood work) drawn today and your tests are completely normal, you will receive your results only by: MyChart Message (if you have MyChart) OR A paper copy in the mail If you have any lab test that is abnormal or we need to change your treatment, we will call you to review the results.  Testing/Procedures: Your physician has requested that you have an echocardiogram. Echocardiography is a painless test that uses sound waves to create images of your heart. It provides your doctor with information about the size and shape of your heart and how well your heart's chambers and valves are working. This procedure takes approximately one hour. There are no  restrictions for this procedure. Please do NOT wear cologne, perfume, aftershave, or lotions (deodorant is allowed). Please arrive 15 minutes prior to your appointment time.  Please note: We ask at that you not bring children with you during ultrasound (echo/ vascular) testing. Due to room size and safety concerns, children are not allowed in the ultrasound rooms during exams. Our front office staff cannot provide observation of children in our lobby area while testing is being conducted. An adult accompanying a patient to their appointment will only be allowed in the ultrasound room at the discretion of the ultrasound technician under special circumstances. We apologize for any inconvenience.   Follow-Up: At [facility], you and your health needs are our priority.  As part of our continuing mission to provide you with exceptional heart care, our providers are all part of one team.  This team includes your primary Cardiologist (physician) and Advanced Practice Providers or APPs (Physician Assistants and Nurse Practitioners) who all work together to provide you with the care you need, when you need it.  Your next appointment:   1 year(s)  Provider:   Ozell Fell, MD              Signed, Ozell Fell, MD  04/08/2024 5:02 PM    [facility]

## 2024-04-08 NOTE — Assessment & Plan Note (Signed)
 Treated with atorvastatin , last LDL cholesterol is 63.

## 2024-04-08 NOTE — Assessment & Plan Note (Signed)
 Treated with atorvastatin .  Anticoagulated with low-dose apixaban .

## 2024-04-08 NOTE — Progress Notes (Unsigned)
 [facility] [address] [place] [address] Phone: [phone]   Assessment and Plan:     1. Chronic right-sided low back pain without sciatica (Primary) 2. Degeneration of intervertebral disc of lumbar region with discogenic back pain -Chronic with exacerbation, subsequent visit - Continued low back pain, primarily right-sided without specific MOI radicular symptoms.  Patient does have 5 mm right renal calculus, though symptoms continue to be more consistent with lumbar origin - Reviewed MRI lumbar spine 04/02/2024 which showed degenerative changes throughout lumbar spine, Schmorl node at L3, with most significant degenerative changes at L4-L5 with moderate right subarticular recess stenosis and right paracentral disc protrusion, disc osteophyte complex, facet arthropathy - Offered epidural CSI to L4-L5 right side.  Patient would like to continue conservative therapy first and would consider epidural in the future if pain worsens - Use Tylenol  500 to 1000 mg tablets 2-3 times a day for day-to-day pain relief - Continue to use lidocaine  patches topically over areas of pain - Do not recommend p.o. NSAIDs or prednisone  with past medical history of DM type II and chronic anticoagulation on Eliquis     Pertinent previous records reviewed include lumbar MRI 04/02/2024   Follow Up: As needed if no improvement or worsening of symptoms.  Patient could call and ask for right sided L4-5 epidural CSI and follow-up 2 weeks after epidural   Subjective:   I, Noah Barnes, am serving as a Neurosurgeon for Doctor Noah Barnes   Chief Complaint: low back pain    HPI:    11/14/2023 Patient is a 78 year old male with low back pain. Patient states pain started a while ago. Xrays showed a small kidney stone. No MOI. Pain when laying on his right side. Pain radiates up the back. No meds for the pain. He does not feel it until he lays on his right  side. He is stiff when he wakes up    03/20/2024 Patient states pain is now moving all the way around, even to his stomach. Pain is intermittent. He has a heating belt and that helps sometimes   04/09/2024 Patient states his pain is still present. Not as severe. Lidocaine  patches have helped    Relevant Historical Information: DM type II, CKD, history of DVT and PE on chronic anticoagulation with Eliquis   Additional pertinent review of systems negative.   Current Outpatient Medications:    lidocaine  (LIDODERM ) 5 %, Place 1 patch onto the skin daily. Remove & Discard patch within 12 hours or as directed by MD, Disp: 30 patch, Rfl: 2   ACCU-CHEK GUIDE TEST test strip, USE AS DIRECTED TO TEST BLOOD GLUCOSE TWICE DAILY, Disp: 100 strip, Rfl: 3   Accu-Chek Softclix Lancets lancets, CHECK BLOOD SUGAR TWICE A DAY., Disp: 100 each, Rfl: 2   albuterol  (PROVENTIL ) (2.5 MG/3ML) 0.083% nebulizer solution, Take 3 mLs (2.5 mg total) by nebulization every 6 (six) hours as needed for wheezing or shortness of breath., Disp: 75 mL, Rfl: 5   albuterol  (VENTOLIN  HFA) 108 (90 Base) MCG/ACT inhaler, INHALE 2 PUFFS BY MOUTH EVERY 6 HOURS AS NEEDED FOR WHEEZING FOR SHORTNESS OF BREATH, Disp: 9 g, Rfl: 7   allopurinol  (ZYLOPRIM ) 300 MG tablet, Take 1 tablet by mouth once daily, Disp: 90 tablet, Rfl: 0   atorvastatin  (LIPITOR) 20 MG tablet, Take 1 tablet by mouth once daily, Disp: 90 tablet, Rfl: 0   Blood Glucose Monitoring Suppl (ACCU-CHEK GUIDE ME) w/Device KIT, 1 each  by Does not apply route in the morning and at bedtime. Use Accu Chek Guide me device to check blood sugar twice daily., Disp: 1 kit, Rfl: 0   ELIQUIS  2.5 MG TABS tablet, Take 1 tablet by mouth twice daily, Disp: 180 tablet, Rfl: 0   FERREX 150 150 MG capsule, Take 1 capsule by mouth once daily, Disp: 90 capsule, Rfl: 0   furosemide  (LASIX ) 40 MG tablet, Take 40 mg by mouth daily., Disp: , Rfl:    glimepiride  (AMARYL ) 1 MG tablet, Take 1 tablet by  mouth once daily with breakfast, Disp: 90 tablet, Rfl: 3   hydrALAZINE  (APRESOLINE ) 50 MG tablet, TAKE 1 TABLET BY MOUTH TWICE DAILY . APPOINTMENT REQUIRED FOR FUTURE REFILLS, Disp: 180 tablet, Rfl: 0   lidocaine  (LIDODERM ) 5 %, Place 1 patch onto the skin daily. Remove & Discard patch within 12 hours or as directed by MD, Disp: 30 patch, Rfl: 1   linaclotide  (LINZESS ) 290 MCG CAPS capsule, Take 1 capsule (290 mcg total) by mouth daily before breakfast., Disp: 90 capsule, Rfl: 3   metoprolol  tartrate (LOPRESSOR ) 100 MG tablet, Take 1 tablet by mouth twice daily, Disp: 180 tablet, Rfl: 0   Semaglutide  (RYBELSUS ) 14 MG TABS, TAKE 1 TABLET BY MOUTH ONCE DAILY IN THE MORNING 30 MINUTES BEFORE BREAKFAST WITH A SIP OF WATER., Disp: 90 tablet, Rfl: 0   sildenafil  (VIAGRA ) 100 MG tablet, TAKE 1 TABLET BY MOUTH EVERY DAY AS NEEDED FOR ERECTILE DYSFUNCTION, Disp: 6 tablet, Rfl: 5   tamsulosin  (FLOMAX ) 0.4 MG CAPS capsule, TAKE 1 CAPSULE BY MOUTH ONCE DAILY AFTER SUPPER, Disp: 90 capsule, Rfl: 0   Tiotropium Bromide Monohydrate  (SPIRIVA  RESPIMAT) 2.5 MCG/ACT AERS, Inhale 2 puffs into the lungs daily., Disp: 4 g, Rfl: 11   tiZANidine  (ZANAFLEX ) 4 MG tablet, Take 1 tablet (4 mg total) by mouth every 6 (six) hours as needed for muscle spasms., Disp: 30 tablet, Rfl: 0   Vitamin D, Ergocalciferol, (DRISDOL) 1.25 MG (50000 UNIT) CAPS capsule, Take 50,000 Units by mouth once a week., Disp: , Rfl:    Objective:     Vitals:   04/09/24 0906  BP: 122/70  Pulse: 67  SpO2: 93%  Weight: 221 lb (100.2 kg)  Height: 5' 7 (1.702 m)      Body mass index is 34.61 kg/m.    Physical Exam:    Gen: Appears well, nad, nontoxic and pleasant Psych: Alert and oriented, appropriate mood and affect Neuro: sensation intact, strength is 5/5 in upper and lower extremities, muscle tone wnl Skin: no susupicious lesions or rashes   Back - Normal skin, Spine with normal alignment and no deformity.   No tenderness to vertebral  process palpation.   Right lumbar paraspinous muscles are   tender and without spasm NTTP gluteal musculature Straight leg raise negative Trendelenberg negative Piriformis Test negative Gait normal  Right-sided low back pain with lumbar flexion and extension      Electronically signed by:  Odis [facility] D.[facility] 9:25 AM 04/09/24

## 2024-04-09 ENCOUNTER — Ambulatory Visit: Admitting: Sports Medicine

## 2024-04-09 VITALS — BP 122/70 | HR 67 | Ht 67.0 in | Wt 221.0 lb

## 2024-04-09 DIAGNOSIS — G8929 Other chronic pain: Secondary | ICD-10-CM

## 2024-04-09 DIAGNOSIS — M545 Low back pain, unspecified: Secondary | ICD-10-CM

## 2024-04-09 DIAGNOSIS — M5136 Other intervertebral disc degeneration, lumbar region with discogenic back pain only: Secondary | ICD-10-CM | POA: Diagnosis not present

## 2024-04-09 MED ORDER — LIDOCAINE 5 % EX PTCH: 1.0000 | MEDICATED_PATCH | CUTANEOUS | 2 refills | Status: AC

## 2024-04-09 NOTE — Patient Instructions (Signed)
 Tylenol  [phone] mg 2-3 times a day for pain relief   Lidocaine  patches over areas of pain   Lidocaine  patch refill   Call us  if you decide you would like an epidural injection   We would see you 2 weeks after your epidural should you wish to get one  As needed follow up

## 2024-04-10 ENCOUNTER — Inpatient Hospital Stay: Admitting: Medical Oncology

## 2024-04-10 ENCOUNTER — Ambulatory Visit: Payer: Self-pay | Admitting: Medical Oncology

## 2024-04-10 ENCOUNTER — Inpatient Hospital Stay: Attending: Medical Oncology

## 2024-04-10 VITALS — BP 125/64 | HR 62 | Temp 98.2°F | Resp 18 | Ht 67.0 in | Wt 223.8 lb

## 2024-04-10 DIAGNOSIS — Z86718 Personal history of other venous thrombosis and embolism: Secondary | ICD-10-CM

## 2024-04-10 DIAGNOSIS — Z86711 Personal history of pulmonary embolism: Secondary | ICD-10-CM | POA: Insufficient documentation

## 2024-04-10 DIAGNOSIS — Z7901 Long term (current) use of anticoagulants: Secondary | ICD-10-CM | POA: Insufficient documentation

## 2024-04-10 LAB — CBC
HCT: 43 % (ref 39.0–52.0)
Hemoglobin: 13.5 g/dL (ref 13.0–17.0)
MCH: 26.8 pg (ref 26.0–34.0)
MCHC: 31.4 g/dL (ref 30.0–36.0)
MCV: 85.3 fL (ref 80.0–100.0)
Platelets: 196 K/uL (ref 150–400)
RBC: 5.04 MIL/uL (ref 4.22–5.81)
RDW: 14.5 % (ref 11.5–15.5)
WBC: 3.7 K/uL — ABNORMAL LOW (ref 4.0–10.5)
nRBC: 0 % (ref 0.0–0.2)

## 2024-04-10 LAB — CMP (CANCER CENTER ONLY)
ALT: 122 U/L — ABNORMAL HIGH (ref 0–44)
AST: 40 U/L (ref 15–41)
Albumin: 4.1 g/dL (ref 3.5–5.0)
Alkaline Phosphatase: 88 U/L (ref 38–126)
Anion gap: 12 (ref 5–15)
BUN: 31 mg/dL — ABNORMAL HIGH (ref 8–23)
CO2: 16 mmol/L — ABNORMAL LOW (ref 22–32)
Calcium: 9.1 mg/dL (ref 8.9–10.3)
Chloride: 110 mmol/L (ref 98–111)
Creatinine: 1.58 mg/dL — ABNORMAL HIGH (ref 0.61–1.24)
GFR, Estimated: 45 mL/min — ABNORMAL LOW (ref >60)
Glucose, Bld: 105 mg/dL — ABNORMAL HIGH (ref 70–99)
Potassium: 4.2 mmol/L (ref 3.5–5.1)
Sodium: 138 mmol/L (ref 135–145)
Total Bilirubin: 0.7 mg/dL (ref 0.0–1.2)
Total Protein: 6.7 g/dL (ref 6.5–8.1)

## 2024-04-10 NOTE — Progress Notes (Signed)
 Hematology and Oncology Follow Up Visit  Noah Barnes [id number] [birth year] 78 y.o. 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 Law is here today for follow-up.   He continues to do well. 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   He continues to tolerate Eliquis  2.5 mg PO BID without any side effects.  No blood loss, bruising or petechiae.  No fever, chills, n/v, cough, rash, dizziness, SOB, chest pain, palpitations, abdominal pain or changes in bowel or bladder habits.  No swelling, numbness or tingling in his extremities at this time. 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  No falls or syncope reported.  Appetite and hydration are good Wt Readings from Last 3 Encounters:  04/10/24 223 lb 12.8 oz (101.5 kg)  04/09/24 221 lb (100.2 kg)  04/08/24 222 lb 6.4 oz (100.9 kg)   ECOG Performance Status: 0 - Asymptomatic  Medications:  Allergies as of 04/10/2024       Reactions   Ace Inhibitors Other (See Comments)   Severe AKI due to ARB + dehydration Aug 2016   Angiotensin Receptor Blockers Other (See Comments)   Severe AKI due to ARB + dehydration Aug 2016        Medication List        Accurate as of April 10, 2024 10:44 AM. If you have any questions, ask your nurse or doctor.          Accu-Chek Guide Me w/Device Kit 1 each by Does not apply route in the morning and at bedtime. Use Accu Chek Guide me device to check blood sugar twice daily.   Accu-Chek Guide Test test strip Generic drug: glucose blood USE AS DIRECTED TO TEST BLOOD GLUCOSE TWICE DAILY   Accu-Chek Softclix Lancets lancets CHECK BLOOD SUGAR TWICE A DAY.   albuterol  (2.5 MG/3ML) 0.083% nebulizer solution Commonly known as: PROVENTIL  Take 3 mLs (2.5 mg total) by  nebulization every 6 (six) hours as needed for wheezing or shortness of breath.   albuterol  108 (90 Base) MCG/ACT inhaler Commonly known as: VENTOLIN  HFA INHALE 2 PUFFS BY MOUTH EVERY 6 HOURS AS NEEDED FOR WHEEZING FOR SHORTNESS OF BREATH   allopurinol  300 MG tablet Commonly known as: ZYLOPRIM  Take 1 tablet by mouth once daily   atorvastatin  20 MG tablet Commonly known as: LIPITOR Take 1 tablet by mouth once daily   Eliquis  2.5 MG Tabs tablet Generic drug: apixaban  Take 1 tablet by mouth twice daily   Ferrex 150 150 MG capsule Generic drug: iron  polysaccharides Take 1 capsule by mouth once daily   furosemide  40 MG tablet Commonly known as: LASIX  Take 40 mg by mouth daily.   glimepiride  1 MG tablet Commonly known as: AMARYL  Take 1 tablet by mouth once daily with breakfast   hydrALAZINE  50 MG tablet Commonly known as: APRESOLINE  TAKE 1 TABLET BY MOUTH TWICE DAILY . APPOINTMENT REQUIRED FOR FUTURE REFILLS   lidocaine  5 % Commonly known as: Lidoderm  Place 1 patch onto the skin daily. Remove & Discard patch within 12 hours or as directed by MD   lidocaine  5 % Commonly known as: Lidoderm  Place 1 patch onto the skin daily. Remove & Discard patch within 12 hours or as directed by MD  linaclotide  290 MCG Caps capsule Commonly known as: Linzess  Take 1 capsule (290 mcg total) by mouth daily before breakfast.   metoprolol  tartrate 100 MG tablet Commonly known as: LOPRESSOR  Take 1 tablet by mouth twice daily   Rybelsus  14 MG Tabs Generic drug: Semaglutide  TAKE 1 TABLET BY MOUTH ONCE DAILY IN THE MORNING 30 MINUTES BEFORE BREAKFAST WITH A SIP OF WATER.   sildenafil  100 MG tablet Commonly known as: VIAGRA  TAKE 1 TABLET BY MOUTH EVERY DAY AS NEEDED FOR ERECTILE DYSFUNCTION   Spiriva  Respimat 2.5 MCG/ACT Aers Generic drug: Tiotropium Bromide Inhale 2 puffs into the lungs daily.   tamsulosin  0.4 MG Caps capsule Commonly known as: FLOMAX  TAKE 1 CAPSULE BY MOUTH ONCE  DAILY AFTER SUPPER   tiZANidine  4 MG tablet Commonly known as: Zanaflex  Take 1 tablet (4 mg total) by mouth every 6 (six) hours as needed for muscle spasms.   Vitamin D (Ergocalciferol) 1.25 MG (50000 UNIT) Caps capsule Commonly known as: DRISDOL Take 50,000 Units by mouth once a week.        Allergies:  Allergies  Allergen Reactions   Ace Inhibitors Other (See Comments)    Severe AKI due to ARB + dehydration Aug 2016   Angiotensin Receptor Blockers Other (See Comments)    Severe AKI due to ARB + dehydration Aug 2016    Past Medical History, Surgical history, Social history, and Family History were reviewed and updated.  Review of Systems: All other 10 point review of systems is negative.   Physical Exam:  height is 5' 7 (1.702 m) and weight is 223 lb 12.8 oz (101.5 kg). His oral temperature is 98.2 F (36.8 C). His blood pressure is 125/64 and his pulse is 62. His respiration is 18 and oxygen saturation is 99%.   Wt Readings from Last 3 Encounters:  04/10/24 223 lb 12.8 oz (101.5 kg)  04/09/24 221 lb (100.2 kg)  04/08/24 222 lb 6.4 oz (100.9 kg)    Ocular: Sclerae unicteric, pupils equal, round and reactive to light Ear-nose-throat: Oropharynx clear, dentition fair Lymphatic: No cervical or supraclavicular adenopathy Lungs no rales or rhonchi, good excursion bilaterally Heart regular rate and rhythm, no murmur appreciated Abd soft, nontender, positive bowel sounds MSK no focal spinal tenderness, no joint edema Neuro: non-focal, well-oriented, appropriate affect  Lab Results  Component Value Date   WBC 3.7 (L) 04/10/2024   HGB 13.5 04/10/2024   HCT 43.0 04/10/2024   MCV 85.3 04/10/2024   PLT 196 04/10/2024   Lab Results  Component Value Date   FERRITIN 211 03/28/2019   IRON  29 (L) 03/28/2019   TIBC 216 (L) 03/28/2019   UIBC 187 03/28/2019   IRONPCTSAT 13 (L) 03/28/2019   Lab Results  Component Value Date   RETICCTPCT 1.2 03/28/2019   RBC 5.04  04/10/2024   No results found for: KPAFRELGTCHN, LAMBDASER, KAPLAMBRATIO No results found for: IGGSERUM, IGA, IGMSERUM No results found for: STEPHANY CARLOTA BENSON MARKEL EARLA JOANNIE, GAMS, MSPIKE, SPEI   Chemistry      Component Value Date/Time   NA 139 10/11/2023 1021   K 4.4 10/11/2023 1021   CL 113 (H) 10/11/2023 1021   CO2 17 (L) 10/11/2023 1021   BUN 31 (H) 10/11/2023 1021   CREATININE 1.66 (H) 10/11/2023 1021      Component Value Date/Time   CALCIUM  9.3 10/11/2023 1021   ALKPHOS 77 10/11/2023 1021   AST 15 10/11/2023 1021   ALT 38 10/11/2023 1021   BILITOT 0.6 10/11/2023  1021      Encounter Diagnoses  Name Primary?   History of DVT of lower extremity    History of pulmonary embolus (PE)    Long term current use of anticoagulant therapy Yes   Impression and Plan: Mr. Linch is a very pleasant 78 yo [place] American gentleman with 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 He completed 6 months of full anticoagulation and is now doing well on maintenance low dose Eliquis  lifelong.   He continues to do well on the Eliquis .  CBC stable. CMP shows elevation of his ALT- he will need to reduce any tylenol  and ETOH. I would recommend that he have his PCP recheck this value in 1 week. Should he have any yellowing of the skin or abdominal pains he will need to have levels rechecked same day.  His creatinine clearance is stable- no dose adjustment necessary.  Continue Eliquis  at current dose.    RTC 6 months APP, labs(CBC, CMP) -  Lauraine CHRISTELLA Dais, PA-C 10/9/202510:44 AM

## 2024-04-12 ENCOUNTER — Encounter: Payer: Self-pay | Admitting: Internal Medicine

## 2024-04-12 ENCOUNTER — Other Ambulatory Visit: Payer: Self-pay | Admitting: Internal Medicine

## 2024-04-12 DIAGNOSIS — R7989 Other specified abnormal findings of blood chemistry: Secondary | ICD-10-CM

## 2024-04-14 ENCOUNTER — Other Ambulatory Visit (HOSPITAL_BASED_OUTPATIENT_CLINIC_OR_DEPARTMENT_OTHER): Payer: Self-pay

## 2024-04-14 ENCOUNTER — Encounter (HOSPITAL_BASED_OUTPATIENT_CLINIC_OR_DEPARTMENT_OTHER): Payer: Self-pay | Admitting: Pulmonary Disease

## 2024-04-14 ENCOUNTER — Other Ambulatory Visit: Payer: Self-pay | Admitting: Internal Medicine

## 2024-04-14 ENCOUNTER — Ambulatory Visit (HOSPITAL_BASED_OUTPATIENT_CLINIC_OR_DEPARTMENT_OTHER): Admitting: Pulmonary Disease

## 2024-04-14 VITALS — BP 117/75 | HR 71 | Ht 67.0 in | Wt 225.4 lb

## 2024-04-14 DIAGNOSIS — J45998 Other asthma: Secondary | ICD-10-CM

## 2024-04-14 DIAGNOSIS — E119 Type 2 diabetes mellitus without complications: Secondary | ICD-10-CM

## 2024-04-14 DIAGNOSIS — R0789 Other chest pain: Secondary | ICD-10-CM

## 2024-04-14 DIAGNOSIS — Z87891 Personal history of nicotine dependence: Secondary | ICD-10-CM

## 2024-04-14 DIAGNOSIS — I10 Essential (primary) hypertension: Secondary | ICD-10-CM

## 2024-04-14 DIAGNOSIS — J4520 Mild intermittent asthma, uncomplicated: Secondary | ICD-10-CM

## 2024-04-14 MED ORDER — SPIRIVA RESPIMAT 2.5 MCG/ACT IN AERS: 2.0000 | INHALATION_SPRAY | Freq: Every day | RESPIRATORY_TRACT | 11 refills | Status: AC

## 2024-04-14 MED ORDER — ALBUTEROL SULFATE HFA 108 (90 BASE) MCG/ACT IN AERS: 2.0000 | INHALATION_SPRAY | Freq: Four times a day (QID) | RESPIRATORY_TRACT | 5 refills | Status: AC | PRN

## 2024-04-14 MED ORDER — FLUZONE HIGH-DOSE 0.5 ML IM SUSY
0.5000 mL | PREFILLED_SYRINGE | Freq: Once | INTRAMUSCULAR | 0 refills | Status: AC
  Filled 2024-04-14: qty 0.5, 1d supply, fill #0

## 2024-04-14 NOTE — Patient Instructions (Signed)
 Asthmatic bronchitis Chronic bronchitis --CONTINUE Spiriva 2.5 mcg TWO puffs ONCE a day --CONTINUE Albuterol AS NEEDED for shortness of breath or wheezing --Use flutter valve as needed for chest congestion

## 2024-04-14 NOTE — Progress Notes (Signed)
 Subjective:   PATIENT ID: Noah Barnes GENDER: male DOB: [birth year], MRN: [id number]   HPI  Chief Complaint  Patient presents with   Follow-up    Chronic bronchitis   Reason for Visit: Follow-up  [name] is a 78 year old male former smoker with HTN, DM2, hx DVT/PE in 2020 who presents for follow-up.  Synopsis: He reports many years of chest congestion worsened with cold weather. He has shortness of breath and congested cough. Denies wheezing. No limitation in activity and perform house work. He has been on Advair twice a day for three months and has made minimal improvement. Mucinex  is ineffective.  On review of EMR he was seen by his PCP Dr Geofm for acute visit for right chest tightness/soreness associated with cough. He was treated with augmentin  for possible pneumonia. His symptoms have persistent since then for >2 months and are worse than his baseline symptoms.   11/03/21 Since our last visit he had Spiriva  added to his Advair and rx flutter valve. He has been compliant with his inhalers. Uses nebulizer once a day. Has not used flutter valve. Feels his current regimen is working for him. Cold weather usually triggers him and causes chest tightening. With warmer weather he symptoms improved. Denies cough. Shortness of breath with exertion. No current chest tightness.  02/27/22 He is compliant with Advair and Spiriva . His breathing has been better this week due to warm weather. Fall and winter is his worst symptom as cold weather triggers and changes in humidity. Denies shortness of breath, cough or wheezing. He was last treated for exacerbation in early Aug 2023 with antibiotics, no steroids.  05/19/22 Since our last visit, he had outpatient exacerbation in Sept/October. He is doing well on increased dose of Advair 250 and Spiriva . Denies coughing, shortness of breath or wheezing.Late fall and winter are worst times. No limitations in activity. No dedicated  exercise  04/12/23 Since our last visit he has only been on the Spiriva  and feels symptoms are well controlled. Previously needed Wixela 250 in fall and spring to help minimize symptoms since fall and winter are his worst times.  Denies shortness of breath, cough or wheezing. Not exercising regularly.  10/09/23 Since our last visit he has been using Spiriva  as needed. Denies shortness of breath, cough or wheezing. Usually have difficult winters but has been doing well for the last two years. No exacerbations for >1 year.   04/14/24 Since our last visit he had a good summer. Uses Spiriva  every other day. Denies shortness of breath, cough or wheezing. [place] are usually his worst but no exacerbations in >1 year.  Social History: Former smoker ~ 7 pack years Son is OB/gyn at [employer]   Past Medical History:  Diagnosis Date   CHF (congestive heart failure) (HCC)    Chronic diastolic heart failure (HCC)    Constipation    Diabetes mellitus    Diverticulitis    Diverticulosis    ED (erectile dysfunction)    Gout    Heart murmur    Hyperlipidemia    Hypertension    IBS (irritable bowel syndrome)    Renal calculus    Renal insufficiency    Tubular adenoma of colon      Family History  Problem Relation Age of Onset   Heart attack Mother 65   Hypertension Mother    Prostate cancer Father 40   Diabetes Father    Irritable bowel syndrome Father    Irritable  bowel syndrome Sister        x 2   Colon cancer Brother        dx in his late 64's   Heart disease Brother    Irritable bowel syndrome Brother    Stroke Neg Hx    Esophageal cancer Neg Hx    Rectal cancer Neg Hx    Stomach cancer Neg Hx      Social History   Occupational History   Occupation: Psychologist, counselling: [employer]    Comment: [phone]  Tobacco Use   Smoking status: Former    Current packs/day: 0.00    Average packs/day: 0.3 packs/day for 23.0 years (6.9 ttl pk-yrs)    Types:  Cigarettes    Start date: 07/03/1968    Quit date: 07/04/1991    Years since quitting: 32.8   Smokeless tobacco: Never   Tobacco comments:    smoked age [age], up to 1/3 ppd; may be less  Vaping Use   Vaping status: Never Used  Substance and Sexual Activity   Alcohol use: Not Currently    Comment: rarely   Drug use: No   Sexual activity: Yes    Allergies  Allergen Reactions   Ace Inhibitors Other (See Comments)    Severe AKI due to ARB + dehydration Aug 2016   Angiotensin Receptor Blockers Other (See Comments)    Severe AKI due to ARB + dehydration Aug 2016     Outpatient Medications Prior to Visit  Medication Sig Dispense Refill   ACCU-CHEK GUIDE TEST test strip USE AS DIRECTED TO TEST BLOOD GLUCOSE TWICE DAILY 100 strip 3   Accu-Chek Softclix Lancets lancets CHECK BLOOD SUGAR TWICE A DAY. 100 each 2   albuterol  (PROVENTIL ) (2.5 MG/3ML) 0.083% nebulizer solution Take 3 mLs (2.5 mg total) by nebulization every 6 (six) hours as needed for wheezing or shortness of breath. 75 mL 5   allopurinol  (ZYLOPRIM ) 300 MG tablet Take 1 tablet by mouth once daily 90 tablet 0   atorvastatin  (LIPITOR) 20 MG tablet Take 1 tablet by mouth once daily 90 tablet 0   Blood Glucose Monitoring Suppl (ACCU-CHEK GUIDE ME) w/Device KIT 1 each by Does not apply route in the morning and at bedtime. Use Accu Chek Guide me device to check blood sugar twice daily. 1 kit 0   ELIQUIS  2.5 MG TABS tablet Take 1 tablet by mouth twice daily 180 tablet 0   FERREX 150 150 MG capsule Take 1 capsule by mouth once daily 90 capsule 0   furosemide  (LASIX ) 40 MG tablet Take 40 mg by mouth daily.     glimepiride  (AMARYL ) 1 MG tablet Take 1 tablet by mouth once daily with breakfast 90 tablet 3   hydrALAZINE  (APRESOLINE ) 50 MG tablet TAKE 1 TABLET BY MOUTH TWICE DAILY . APPOINTMENT REQUIRED FOR FUTURE REFILLS 180 tablet 0   Influenza vac split trivalent PF (FLUZONE HIGH-DOSE) 0.5 ML injection Inject 0.5 mLs into the muscle once for  1 dose. 0.5 mL 0   lidocaine  (LIDODERM ) 5 % Place 1 patch onto the skin daily. Remove & Discard patch within 12 hours or as directed by MD 30 patch 1   lidocaine  (LIDODERM ) 5 % Place 1 patch onto the skin daily. Remove & Discard patch within 12 hours or as directed by MD 30 patch 2   linaclotide  (LINZESS ) 290 MCG CAPS capsule Take 1 capsule (290 mcg total) by mouth daily before breakfast. 90  capsule 3   metoprolol  tartrate (LOPRESSOR ) 100 MG tablet Take 1 tablet by mouth twice daily 180 tablet 0   Semaglutide  (RYBELSUS ) 14 MG TABS TAKE 1 TABLET BY MOUTH ONCE DAILY IN THE MORNING 30 MINUTES BEFORE BREAKFAST WITH A SIP OF WATER. 90 tablet 0   sildenafil  (VIAGRA ) 100 MG tablet TAKE 1 TABLET BY MOUTH EVERY DAY AS NEEDED FOR ERECTILE DYSFUNCTION 6 tablet 5   tamsulosin  (FLOMAX ) 0.4 MG CAPS capsule TAKE 1 CAPSULE BY MOUTH ONCE DAILY AFTER SUPPER 90 capsule 0   tiZANidine  (ZANAFLEX ) 4 MG tablet Take 1 tablet (4 mg total) by mouth every 6 (six) hours as needed for muscle spasms. 30 tablet 0   Vitamin D, Ergocalciferol, (DRISDOL) 1.25 MG (50000 UNIT) CAPS capsule Take 50,000 Units by mouth once a week.     albuterol  (VENTOLIN  HFA) 108 (90 Base) MCG/ACT inhaler INHALE 2 PUFFS BY MOUTH EVERY 6 HOURS AS NEEDED FOR WHEEZING FOR SHORTNESS OF BREATH 9 g 7   Tiotropium Bromide Monohydrate  (SPIRIVA  RESPIMAT) 2.5 MCG/ACT AERS Inhale 2 puffs into the lungs daily. 4 g 11   No facility-administered medications prior to visit.    Review of Systems  Constitutional:  Negative for chills, diaphoresis, fever, malaise/fatigue and weight loss.  HENT:  Negative for congestion.   Respiratory:  Negative for cough, hemoptysis, sputum production, shortness of breath and wheezing.   Cardiovascular:  Negative for chest pain, palpitations and leg swelling.     Objective:   Vitals:   04/14/24 0950  BP: 117/75  Pulse: 71  SpO2: 96%  Weight: 225 lb 6.4 oz (102.2 kg)  Height: 5' 7 (1.702 m)   SpO2: 96 %  Physical  Exam: General: Well-appearing, no acute distress HENT: [place], AT Eyes: EOMI, no scleral icterus Respiratory: Clear to auscultation bilaterally.  No crackles, wheezing or rales Cardiovascular: RRR, -M/R/G, no JVD Extremities:-Edema,-tenderness Neuro: AAO x4, CNII-XII grossly intact Psych: Normal mood, normal affect   Data Reviewed:  Imaging: CXR 07/26/21 - No active cardiopulmonary disease. No infiltrate effusion or edema CXR 01/31/22 - No active cardiopulmonary disease  PFT: 11/03/21 FVC 2.78 (83%) FEV1 2.35 (95%) Ratio 84  TLC 81% DLCO 80% Interpretation: No obstructive or restrictive defect. Normal lung volumes and normal DLCO.  Labs: CBC    Component Value Date/Time   WBC 3.7 (L) 04/10/2024 1016   RBC 5.04 04/10/2024 1016   HGB 13.5 04/10/2024 1016   HGB 13.7 04/04/2023 0933   HCT 43.0 04/10/2024 1016   PLT 196 04/10/2024 1016   PLT 204 04/04/2023 0933   MCV 85.3 04/10/2024 1016   MCH 26.8 04/10/2024 1016   MCHC 31.4 04/10/2024 1016   RDW 14.5 04/10/2024 1016   LYMPHSABS 1.3 08/13/2023 0907   MONOABS 0.4 08/13/2023 0907   EOSABS 0.1 08/13/2023 0907   BASOSABS 0.0 08/13/2023 0907   Absolute eos 03/25/21 - 100     Assessment & Plan:   Discussion: 78 year old former smoker with HTN, DM2, hx DVT/PE in 2020 who presents follow-up. Last exacerbation in fall 2023 >2 years. Well controlled on LAMA, planning to take daily with fall/spring time. Discussed clinical course and management of asthmatic bronchitis including bronchodilator regimen, preventive care including vaccinations (UTD influenza and covid, recommended RSV) and action plan for exacerbation.  Asthmatic bronchitis Chronic bronchitis --CONTINUE Spiriva  2.5 mcg TWO puffs ONCE a day --CONTINUE Albuterol  AS NEEDED for shortness of breath or wheezing --Use flutter valve as needed for chest congestion  Action Plan Increase Albuterol  for  worsening shortness of breath, wheezing and cough. If you symptoms do not improve  in 24-48 hours, please our office for evaluation and/or prednisone  taper and/or antibiotics.   Health Maintenance Immunization History  Administered Date(s) Administered   Fluad Quad(high Dose 65+) 03/06/2019, 05/05/2020, 03/26/2021, 04/04/2022   Fluad Trivalent(High Dose 65+) 03/19/2023   INFLUENZA, HIGH DOSE SEASONAL PF 07/12/2016, 04/10/2017, 03/22/2018   PFIZER Comirnaty [place] Top)Covid-19 Tri-Sucrose Vaccine 01/14/2021   PFIZER(Purple Top)SARS-COV-2 Vaccination 08/13/2019, 09/10/2019, 04/06/2020   Pfizer Covid-19 Vaccine Bivalent Booster 45yrs & up 06/08/2021   Pfizer(Comirnaty )Fall Seasonal Vaccine 12 years and older 04/19/2022, 03/26/2023, 03/19/2024   Pneumococcal Conjugate-13 01/06/2019   Pneumococcal Polysaccharide-23 03/01/2020   CT Lung Screen - not qualified. Insufficient tobacco history  No orders of the defined types were placed in this encounter.  Meds ordered this encounter  Medications   Tiotropium Bromide (SPIRIVA  RESPIMAT) 2.5 MCG/ACT AERS    Sig: Inhale 2 puffs into the lungs daily.    Dispense:  4 g    Refill:  11    Please send all further refill requests electronically only.   albuterol  (VENTOLIN  HFA) 108 (90 Base) MCG/ACT inhaler    Sig: Inhale 2 puffs into the lungs every 6 (six) hours as needed for wheezing or shortness of breath.    Dispense:  8 g    Refill:  5    Return in about 6 months (around 10/13/2024).  I have spent a total time of 30-minutes on the day of the appointment including chart review, data review, collecting history, coordinating care and discussing medical diagnosis and plan with the patient/family. Past medical history, allergies, medications were reviewed. Pertinent imaging, labs and tests included in this note have been reviewed and interpreted independently by me.  Arvada Seaborn Slater Staff, MD [facility] 04/14/2024 10:17 AM

## 2024-04-17 DIAGNOSIS — E1122 Type 2 diabetes mellitus with diabetic chronic kidney disease: Secondary | ICD-10-CM | POA: Diagnosis not present

## 2024-04-17 DIAGNOSIS — J4489 Other specified chronic obstructive pulmonary disease: Secondary | ICD-10-CM | POA: Diagnosis not present

## 2024-04-17 DIAGNOSIS — I130 Hypertensive heart and chronic kidney disease with heart failure and stage 1 through stage 4 chronic kidney disease, or unspecified chronic kidney disease: Secondary | ICD-10-CM | POA: Diagnosis not present

## 2024-04-17 DIAGNOSIS — I4891 Unspecified atrial fibrillation: Secondary | ICD-10-CM | POA: Diagnosis not present

## 2024-04-17 DIAGNOSIS — I82509 Chronic embolism and thrombosis of unspecified deep veins of unspecified lower extremity: Secondary | ICD-10-CM | POA: Diagnosis not present

## 2024-04-17 DIAGNOSIS — I700 Atherosclerosis of aorta: Secondary | ICD-10-CM | POA: Diagnosis not present

## 2024-04-17 DIAGNOSIS — N1831 Chronic kidney disease, stage 3a: Secondary | ICD-10-CM | POA: Diagnosis not present

## 2024-04-17 DIAGNOSIS — I509 Heart failure, unspecified: Secondary | ICD-10-CM | POA: Diagnosis not present

## 2024-04-29 ENCOUNTER — Other Ambulatory Visit: Payer: Self-pay | Admitting: Endocrinology

## 2024-04-29 DIAGNOSIS — E669 Obesity, unspecified: Secondary | ICD-10-CM

## 2024-05-11 ENCOUNTER — Other Ambulatory Visit: Payer: Self-pay | Admitting: Internal Medicine

## 2024-05-13 ENCOUNTER — Other Ambulatory Visit: Payer: Self-pay | Admitting: Internal Medicine

## 2024-05-17 ENCOUNTER — Other Ambulatory Visit: Payer: Self-pay | Admitting: Internal Medicine

## 2024-05-20 ENCOUNTER — Ambulatory Visit (HOSPITAL_COMMUNITY)
Admission: RE | Admit: 2024-05-20 | Discharge: 2024-05-20 | Disposition: A | Discharge status: Home / Self Care | Source: Ambulatory Visit | Attending: Cardiovascular Disease | Admitting: Cardiovascular Disease

## 2024-05-20 ENCOUNTER — Other Ambulatory Visit: Payer: Self-pay | Admitting: Internal Medicine

## 2024-05-20 DIAGNOSIS — I272 Pulmonary hypertension, unspecified: Secondary | ICD-10-CM | POA: Insufficient documentation

## 2024-05-20 DIAGNOSIS — I5032 Chronic diastolic (congestive) heart failure: Secondary | ICD-10-CM | POA: Diagnosis not present

## 2024-05-20 LAB — ECHOCARDIOGRAM COMPLETE
Area-P 1/2: 3.75 cm2
S' Lateral: 2.9 cm

## 2024-05-24 ENCOUNTER — Other Ambulatory Visit: Payer: Self-pay | Admitting: Internal Medicine

## 2024-06-01 ENCOUNTER — Ambulatory Visit: Payer: Self-pay | Admitting: Cardiovascular Disease

## 2024-06-02 ENCOUNTER — Ambulatory Visit: Admitting: Endocrinology

## 2024-06-02 ENCOUNTER — Ambulatory Visit: Payer: Self-pay | Admitting: Endocrinology

## 2024-06-02 ENCOUNTER — Encounter: Payer: Self-pay | Admitting: Endocrinology

## 2024-06-02 VITALS — BP 138/80 | HR 67 | Resp 16 | Ht 67.0 in | Wt 225.6 lb

## 2024-06-02 DIAGNOSIS — Z7984 Long term (current) use of oral hypoglycemic drugs: Secondary | ICD-10-CM

## 2024-06-02 DIAGNOSIS — E669 Obesity, unspecified: Secondary | ICD-10-CM

## 2024-06-02 DIAGNOSIS — E119 Type 2 diabetes mellitus without complications: Secondary | ICD-10-CM

## 2024-06-02 LAB — POCT GLYCOSYLATED HEMOGLOBIN (HGB A1C): Hemoglobin A1C: 5.7 % — AB (ref 4.0–5.6)

## 2024-06-02 NOTE — Progress Notes (Signed)
 Outpatient Endocrinology Note Noah Trabert, MD  06/02/24  Patient's Name: Noah Barnes    DOB: [birth year]    MRN: [id number]                                                    REASON OF VISIT: Follow up of type 2 diabetes mellitus  PCP: Geofm Glade PARAS, MD  HISTORY OF PRESENT ILLNESS:   Noah Barnes is a 78 y.o. old male with past medical history listed below, is here for follow up for type 2 diabetes mellitus.    Pertinent Diabetes History: Patient was previously seen by Dr. Von in July 2024.  Patient was diagnosed with type 2 diabetes mellitus in 2008.  Patient has controlled type 2 diabetes mellitus.  Chronic Diabetes Complications : Retinopathy: no. Last ophthalmology exam was done on 01/2023, following with ophthalmology regularly.  Nephropathy: CKD III, following with nephrology. Peripheral neuropathy: no Coronary artery disease: no Stroke: on  Relevant comorbidities and cardiovascular risk factors: Obesity: yes Body mass index is 35.33 kg/m.  Hypertension: Yes  Hyperlipidemia : Yes, on statin   Current / Home Diabetic regimen includes: Rybelsus  14 mg daily. Amaryl  1 mg daily with supper.  Prior diabetic medications: Metformin , Janumet , Invokana  in the past.  Hypoglycemia with glimepiride  2 mg in the past.  Glycemic data:    Accu-Chek guide glucometer.  Not able to download, glucose data reviewed directly from the meter 100, 118, 101, 103, 104, 100, 90, 91, 128, 112, 119, 109, 97, 85, 110, 92, 106, 100, 136, 91.  He has been checking blood sugar usually 2 times a day in the morning fasting and at bedtime.  No hypoglycemia.  Hypoglycemia: Patient has no hypoglycemic episodes. Patient has hypoglycemia awareness.  Factors modifying glucose control: 1.  Diabetic diet assessment: 3 meals a day.  2.  Staying active or exercising: No formal exercise.  3.  Medication compliance: compliant all of the time.  Interval history  Hemoglobin A1c 5.7% today.  Denies  hypoglycemic symptoms.  Glucometer data as reviewed above.  Diabetes regimen as reviewed and noted above.  He has been tolerating Rybelsus  well.  No other complaints today.  REVIEW OF SYSTEMS As per history of present illness.   PAST MEDICAL HISTORY: Past Medical History:  Diagnosis Date   CHF (congestive heart failure) (HCC)    Chronic diastolic heart failure (HCC)    Constipation    Diabetes mellitus    Diverticulitis    Diverticulosis    ED (erectile dysfunction)    Gout    Heart murmur    Hyperlipidemia    Hypertension    IBS (irritable bowel syndrome)    Renal calculus    Renal insufficiency    Tubular adenoma of colon     PAST SURGICAL HISTORY: Past Surgical History:  Procedure Laterality Date   COLONOSCOPY  1998   negative; no F/U   EXTRACORPOREAL SHOCK WAVE LITHOTRIPSY Left 03/20/2019   Procedure: EXTRACORPOREAL SHOCK WAVE LITHOTRIPSY (ESWL);  Surgeon: Sherrilee Belvie CROME, MD;  Location: [facility];  Service: Urology;  Laterality: Left;   knee effusion tapped      ALLERGIES: Allergies  Allergen Reactions   Ace Inhibitors Other (See Comments)    Severe AKI due to ARB + dehydration Aug 2016   Angiotensin Receptor Blockers Other (See Comments)  Severe AKI due to ARB + dehydration Aug 2016    FAMILY HISTORY:  Family History  Problem Relation Age of Onset   Heart attack Mother 71   Hypertension Mother    Prostate cancer Father 67   Diabetes Father    Irritable bowel syndrome Father    Irritable bowel syndrome Sister        x 2   Colon cancer Brother        dx in his late 34's   Heart disease Brother    Irritable bowel syndrome Brother    Stroke Neg Hx    Esophageal cancer Neg Hx    Rectal cancer Neg Hx    Stomach cancer Neg Hx     SOCIAL HISTORY: Social History   Socioeconomic History   Marital status: Married    Spouse name: Not on file   Number of children: 1   Years of education: Not on file   Highest education level: Not on file   Occupational History   Occupation: Psychologist, Counselling: [employer]    Comment: [phone]  Tobacco Use   Smoking status: Former    Current packs/day: 0.00    Average packs/day: 0.3 packs/day for 23.0 years (6.9 ttl pk-yrs)    Types: Cigarettes    Start date: 07/03/1968    Quit date: 07/04/1991    Years since quitting: 32.9   Smokeless tobacco: Never   Tobacco comments:    smoked age [age], up to 1/3 ppd; may be less  Vaping Use   Vaping status: Never Used  Substance and Sexual Activity   Alcohol use: Not Currently    Comment: rarely   Drug use: No   Sexual activity: Yes  Other Topics Concern   Not on file  Social History Narrative   ** Merged History Encounter **       Social Drivers of Health   Financial Resource Strain: Low Risk  (01/02/2024)   Overall Financial Resource Strain (CARDIA)    Difficulty of Paying Living Expenses: Not hard at all  Food Insecurity: No Food Insecurity (01/02/2024)   Hunger Vital Sign    Worried About Running Out of Food in the Last Year: Never true    Ran Out of Food in the Last Year: Never true  Transportation Needs: No Transportation Needs (01/02/2024)   PRAPARE - Administrator, Civil Service (Medical): No    Lack of Transportation (Non-Medical): No  Physical Activity: Inactive (01/02/2024)   Exercise Vital Sign    Days of Exercise per Week: 0 days    Minutes of Exercise per Session: 0 min  Stress: No Stress Concern Present (01/02/2024)   [employer] of Occupational Health - Occupational Stress Questionnaire    Feeling of Stress: Not at all  Social Connections: Socially Integrated (01/02/2024)   Social Connection and Isolation Panel    Frequency of Communication with Friends and Family: More than three times a week    Frequency of Social Gatherings with Friends and Family: More than three times a week    Attends Religious Services: More than 4 times per year    Active Member of [employer] or Organizations:  Yes    Attends Engineer, Structural: More than 4 times per year    Marital Status: Married    MEDICATIONS:  Current Outpatient Medications  Medication Sig Dispense Refill   ACCU-CHEK GUIDE TEST test strip USE AS DIRECTED TO TEST BLOOD  GLUCOSE TWICE DAILY 100 strip 3   Accu-Chek Softclix Lancets lancets CHECK BLOOD SUGAR TWICE A DAY. 100 each 2   albuterol  (PROVENTIL ) (2.5 MG/3ML) 0.083% nebulizer solution Take 3 mLs (2.5 mg total) by nebulization every 6 (six) hours as needed for wheezing or shortness of breath. 75 mL 5   albuterol  (VENTOLIN  HFA) 108 (90 Base) MCG/ACT inhaler Inhale 2 puffs into the lungs every 6 (six) hours as needed for wheezing or shortness of breath. 8 g 5   allopurinol  (ZYLOPRIM ) 300 MG tablet Take 1 tablet by mouth once daily 90 tablet 0   atorvastatin  (LIPITOR) 20 MG tablet Take 1 tablet by mouth once daily 90 tablet 0   Blood Glucose Monitoring Suppl (ACCU-CHEK GUIDE ME) w/Device KIT 1 each by Does not apply route in the morning and at bedtime. Use Accu Chek Guide me device to check blood sugar twice daily. 1 kit 0   ELIQUIS  2.5 MG TABS tablet Take 1 tablet by mouth twice daily 180 tablet 0   FERREX 150 150 MG capsule Take 1 capsule by mouth once daily 90 capsule 0   furosemide  (LASIX ) 40 MG tablet Take 40 mg by mouth daily.     glimepiride  (AMARYL ) 1 MG tablet Take 1 tablet by mouth once daily with breakfast 90 tablet 3   hydrALAZINE  (APRESOLINE ) 50 MG tablet TAKE 1 TABLET BY MOUTH TWICE DAILY . APPOINTMENT REQUIRED FOR FUTURE REFILLS 180 tablet 0   lidocaine  (LIDODERM ) 5 % Place 1 patch onto the skin daily. Remove & Discard patch within 12 hours or as directed by MD 30 patch 1   lidocaine  (LIDODERM ) 5 % Place 1 patch onto the skin daily. Remove & Discard patch within 12 hours or as directed by MD 30 patch 2   linaclotide  (LINZESS ) 290 MCG CAPS capsule Take 1 capsule (290 mcg total) by mouth daily before breakfast. 90 capsule 3   metoprolol  tartrate  (LOPRESSOR ) 100 MG tablet Take 1 tablet by mouth twice daily 180 tablet 0   Semaglutide  (RYBELSUS ) 14 MG TABS TAKE 1 TABLET BY MOUTH IN THE MORNING 30  MINUTES  BEFORE  BREAKFAST  WITH  A  SIP  OF  WATER 90 tablet 0   sildenafil  (VIAGRA ) 100 MG tablet TAKE 1 TABLET BY MOUTH EVERY DAY AS NEEDED FOR ERECTILE DYSFUNCTION 6 tablet 5   tamsulosin  (FLOMAX ) 0.4 MG CAPS capsule TAKE 1 CAPSULE BY MOUTH ONCE DAILY AFTER SUPPER 90 capsule 0   Tiotropium Bromide (SPIRIVA  RESPIMAT) 2.5 MCG/ACT AERS Inhale 2 puffs into the lungs daily. 4 g 11   tiZANidine  (ZANAFLEX ) 4 MG tablet Take 1 tablet (4 mg total) by mouth every 6 (six) hours as needed for muscle spasms. 30 tablet 0   Vitamin D, Ergocalciferol, (DRISDOL) 1.25 MG (50000 UNIT) CAPS capsule Take 50,000 Units by mouth once a week.     No current facility-administered medications for this visit.    PHYSICAL EXAM: Vitals:   06/02/24 0905  BP: 138/80  Pulse: 67  Resp: 16  SpO2: 95%  Weight: 225 lb 9.6 oz (102.3 kg)  Height: 5' 7 (1.702 m)      Body mass index is 35.33 kg/m.  Wt Readings from Last 3 Encounters:  06/02/24 225 lb 9.6 oz (102.3 kg)  04/14/24 225 lb 6.4 oz (102.2 kg)  04/10/24 223 lb 12.8 oz (101.5 kg)    General: Well developed, well nourished male in no apparent distress.  HEENT: AT/[place], no external lesions.  Eyes: Conjunctiva clear  and no icterus. Neck: Neck supple  Lungs: Respirations not labored Neurologic: Alert, oriented, normal speech Extremities / Skin: Dry.  Psychiatric: Does not appear depressed or anxious  Diabetic Foot Exam - Simple   No data filed    LABS Reviewed Lab Results  Component Value Date   HGBA1C 5.7 (A) 06/02/2024   HGBA1C 6.0 (A) 01/31/2024   HGBA1C 5.7 (A) 09/14/2023   Lab Results  Component Value Date   FRUCTOSAMINE 211 05/14/2023   FRUCTOSAMINE 241 02/24/2020   FRUCTOSAMINE 263 05/15/2019   Lab Results  Component Value Date   CHOL 126 08/13/2023   HDL 53.10 08/13/2023   LDLCALC  63 08/13/2023   LDLDIRECT 80.1 04/15/2014   TRIG 51.0 08/13/2023   CHOLHDL 2 08/13/2023   Lab Results  Component Value Date   MICRALBCREAT 19.7 02/14/2024   MICRALBCREAT 1.8 02/25/2010   Lab Results  Component Value Date   CREATININE 1.58 (H) 04/10/2024   Lab Results  Component Value Date   GFR 41.57 (L) 08/13/2023   ASSESSMENT / PLAN  1. Type 2 diabetes mellitus in patient with obesity (HCC)    Diabetes Mellitus type 2, complicated by CKD. - Diabetic status / severity: Controlled.  Lab Results  Component Value Date   HGBA1C 5.7 (A) 06/02/2024    - Hemoglobin A1c goal : <6.5%  Mostly acceptable blood sugar based on glucometer data.  Asked to call our clinic if he develops any blood sugar close to 70 or any hypoglycemic episode, will stop glimepiride  at that time.  - Medications: No change.  I) continue Rybelsus  14 mg daily. II) continue Amaryl  1 mg daily.  - Home glucose testing: In the morning fasting and at bedtime. - Discussed/ Gave Hypoglycemia treatment plan.  Discussed about potential hypoglycemic symptoms.  # Consult : not required at this time.   # Annual urine for microalbuminuria/ creatinine ratio, no microalbuminuria currently, he has CKD following with nephrology. Normal urine microalbumin creatinine ratio in 05/2023.  Last  Lab Results  Component Value Date   MICRALBCREAT 19.7 02/14/2024    # Foot check nightly.  # Annual dilated diabetic eye exams. Has appointment today.   - Diet: Make healthy diabetic food choices - Life style / activity / exercise: Discussed.  2. Blood pressure  -  BP Readings from Last 1 Encounters:  06/02/24 138/80    - Control is in target.  - No change in current plans.  3. Lipid status / Hyperlipidemia - Last  Lab Results  Component Value Date   LDLCALC 63 08/13/2023   - Continue atorvastatin  20 mg daily.  Managed by primary care provider.  Diagnoses and all orders for this visit:  Type 2 diabetes  mellitus in patient with obesity (HCC) -     POCT glycosylated hemoglobin (Hb A1C)    DISPOSITION Follow up in clinic in 4 months suggested.   All questions answered and patient verbalized understanding of the plan.  Noah Gavin, MD [facility] [address] [place], [address] [place], [place] [address] Phone # [phone]  At least part of this note was generated using voice recognition software. Inadvertent word errors may have occurred, which were not recognized during the proofreading process.

## 2024-06-20 ENCOUNTER — Other Ambulatory Visit: Payer: Self-pay | Admitting: Internal Medicine

## 2024-06-26 ENCOUNTER — Other Ambulatory Visit: Payer: Self-pay | Admitting: Hematology & Oncology

## 2024-06-26 DIAGNOSIS — Z86718 Personal history of other venous thrombosis and embolism: Secondary | ICD-10-CM

## 2024-06-26 DIAGNOSIS — Z86711 Personal history of pulmonary embolism: Secondary | ICD-10-CM

## 2024-06-27 ENCOUNTER — Encounter: Payer: Self-pay | Admitting: Hematology

## 2024-08-05 ENCOUNTER — Other Ambulatory Visit: Payer: Self-pay | Admitting: Internal Medicine

## 2024-08-19 ENCOUNTER — Ambulatory Visit: Admitting: Internal Medicine

## 2024-09-02 ENCOUNTER — Ambulatory Visit (HOSPITAL_BASED_OUTPATIENT_CLINIC_OR_DEPARTMENT_OTHER): Admitting: Pulmonary Disease

## 2024-10-01 ENCOUNTER — Ambulatory Visit: Admitting: Endocrinology

## 2024-10-08 ENCOUNTER — Ambulatory Visit: Admitting: Endocrinology

## 2024-10-09 ENCOUNTER — Ambulatory Visit: Admitting: Medical Oncology

## 2024-10-09 ENCOUNTER — Other Ambulatory Visit

## 2025-01-06 ENCOUNTER — Ambulatory Visit
